# Patient Record
Sex: Male | Born: 1935 | Race: Black or African American | Hispanic: No | Marital: Married | Smoking: Never smoker
Health system: Southern US, Community
[De-identification: ages and names within clinical notes are randomized; demographics above are authoritative.]

## PROBLEM LIST (undated history)

## (undated) VITALS — BP 121/78 | HR 71 | Temp 98.5°F | Resp 18 | Wt 212.7 lb

## (undated) VITALS — HR 62 | Temp 98.5°F | Resp 18 | Wt 214.0 lb

## (undated) VITALS — BP 126/80 | HR 74 | Temp 97.7°F | Resp 18 | Wt 213.1 lb

## (undated) VITALS — BP 167/78 | HR 73 | Temp 97.5°F | Resp 18 | Wt 220.3 lb

## (undated) VITALS — BP 113/72 | HR 71 | Temp 98.1°F | Resp 18 | Wt 220.9 lb

## (undated) VITALS — BP 149/74 | HR 65 | Temp 97.6°F | Resp 18 | Wt 227.9 lb

## (undated) VITALS — BP 155/75 | HR 67 | Temp 98.5°F | Resp 18 | Wt 214.7 lb

## (undated) VITALS — BP 127/63 | HR 68 | Temp 98.1°F | Resp 18 | Wt 221.3 lb

## (undated) VITALS — BP 154/77 | HR 68 | Temp 98.6°F | Resp 18 | Wt 221.7 lb

## (undated) VITALS — BP 136/75 | HR 80 | Temp 98.0°F | Resp 18 | Wt 213.8 lb

## (undated) DIAGNOSIS — I10 Essential (primary) hypertension: Secondary | ICD-10-CM

## (undated) DIAGNOSIS — H409 Unspecified glaucoma: Secondary | ICD-10-CM

## (undated) DIAGNOSIS — E119 Type 2 diabetes mellitus without complications: Secondary | ICD-10-CM

## (undated) DIAGNOSIS — G609 Hereditary and idiopathic neuropathy, unspecified: Secondary | ICD-10-CM

## (undated) DIAGNOSIS — L738 Other specified follicular disorders: Secondary | ICD-10-CM

## (undated) DIAGNOSIS — N209 Urinary calculus, unspecified: Secondary | ICD-10-CM

## (undated) DIAGNOSIS — E785 Hyperlipidemia, unspecified: Secondary | ICD-10-CM

## (undated) DIAGNOSIS — E109 Type 1 diabetes mellitus without complications: Secondary | ICD-10-CM

## (undated) DIAGNOSIS — E291 Testicular hypofunction: Secondary | ICD-10-CM

## (undated) DIAGNOSIS — N400 Enlarged prostate without lower urinary tract symptoms: Secondary | ICD-10-CM

## (undated) DIAGNOSIS — C61 Malignant neoplasm of prostate: Secondary | ICD-10-CM

## (undated) DIAGNOSIS — IMO0002 Reserved for concepts with insufficient information to code with codable children: Secondary | ICD-10-CM

## (undated) DIAGNOSIS — H9190 Unspecified hearing loss, unspecified ear: Secondary | ICD-10-CM

## (undated) DIAGNOSIS — R809 Proteinuria, unspecified: Secondary | ICD-10-CM

## (undated) DIAGNOSIS — D72819 Decreased white blood cell count, unspecified: Secondary | ICD-10-CM

## (undated) DIAGNOSIS — F329 Major depressive disorder, single episode, unspecified: Secondary | ICD-10-CM

## (undated) DIAGNOSIS — M818 Other osteoporosis without current pathological fracture: Secondary | ICD-10-CM

## (undated) DIAGNOSIS — L409 Psoriasis, unspecified: Secondary | ICD-10-CM

## (undated) DIAGNOSIS — D126 Benign neoplasm of colon, unspecified: Secondary | ICD-10-CM

## (undated) HISTORY — DX: Psoriasis, unspecified: L40.9

## (undated) HISTORY — DX: Proteinuria, unspecified: R80.9

## (undated) HISTORY — DX: Benign prostatic hyperplasia without lower urinary tract symptoms: N40.0

## (undated) HISTORY — DX: Other specified follicular disorders: L73.8

## (undated) HISTORY — DX: Benign neoplasm of colon, unspecified: D12.6

## (undated) HISTORY — DX: Hereditary and idiopathic neuropathy, unspecified: G60.9

## (undated) HISTORY — DX: Other osteoporosis without current pathological fracture: M81.8

## (undated) HISTORY — DX: Testicular hypofunction: E29.1

## (undated) HISTORY — DX: Unspecified glaucoma: H40.9

## (undated) HISTORY — DX: Essential (primary) hypertension: I10

## (undated) HISTORY — DX: Hyperlipidemia, unspecified: E78.5

## (undated) HISTORY — DX: Unspecified hearing loss, unspecified ear: H91.90

## (undated) HISTORY — DX: Major depressive disorder, single episode, unspecified: F32.9

## (undated) HISTORY — DX: Urinary calculus, unspecified: N20.9

## (undated) HISTORY — DX: Type 1 diabetes mellitus without complications: E10.9

## (undated) HISTORY — DX: Decreased white blood cell count, unspecified: D72.819

## (undated) HISTORY — DX: Reserved for concepts with insufficient information to code with codable children: IMO0002

## (undated) HISTORY — DX: Type 2 diabetes mellitus without complications: E11.9

## (undated) HISTORY — PX: OTHER SURGICAL HISTORY: SHX169

---

## 1984-12-24 HISTORY — PX: APPENDECTOMY: SHX54

## 1995-12-25 HISTORY — PX: CATARACT EXTRACTION: SUR2

## 2004-04-17 ENCOUNTER — Encounter: Payer: Self-pay | Admitting: Gastroenterology

## 2004-09-21 ENCOUNTER — Encounter: Admission: RE | Admit: 2004-09-21 | Discharge: 2004-12-20 | Payer: Self-pay | Admitting: Endocrinology

## 2004-11-30 ENCOUNTER — Ambulatory Visit: Payer: Self-pay | Admitting: Endocrinology

## 2004-12-14 ENCOUNTER — Ambulatory Visit: Payer: Self-pay | Admitting: Endocrinology

## 2004-12-24 HISTORY — PX: CATARACT EXTRACTION: SUR2

## 2005-02-19 ENCOUNTER — Encounter: Admission: RE | Admit: 2005-02-19 | Discharge: 2005-05-20 | Payer: Self-pay | Admitting: Endocrinology

## 2005-02-22 ENCOUNTER — Ambulatory Visit: Payer: Self-pay | Admitting: Endocrinology

## 2005-03-14 ENCOUNTER — Ambulatory Visit: Payer: Self-pay | Admitting: Endocrinology

## 2005-03-16 ENCOUNTER — Encounter: Admission: RE | Admit: 2005-03-16 | Discharge: 2005-03-16 | Payer: Self-pay | Admitting: Endocrinology

## 2005-04-04 ENCOUNTER — Ambulatory Visit: Payer: Self-pay | Admitting: Endocrinology

## 2005-04-10 ENCOUNTER — Ambulatory Visit: Payer: Self-pay | Admitting: Endocrinology

## 2005-05-16 ENCOUNTER — Ambulatory Visit: Payer: Self-pay | Admitting: Endocrinology

## 2005-05-17 ENCOUNTER — Ambulatory Visit: Payer: Self-pay | Admitting: Endocrinology

## 2005-06-05 ENCOUNTER — Ambulatory Visit: Payer: Self-pay | Admitting: Endocrinology

## 2005-06-11 ENCOUNTER — Ambulatory Visit: Payer: Self-pay | Admitting: Endocrinology

## 2005-08-10 ENCOUNTER — Ambulatory Visit: Payer: Self-pay | Admitting: Endocrinology

## 2005-10-04 ENCOUNTER — Ambulatory Visit: Payer: Self-pay | Admitting: Endocrinology

## 2005-11-06 ENCOUNTER — Ambulatory Visit: Payer: Self-pay | Admitting: Endocrinology

## 2005-12-14 ENCOUNTER — Ambulatory Visit: Payer: Self-pay | Admitting: Internal Medicine

## 2005-12-19 ENCOUNTER — Ambulatory Visit: Payer: Self-pay | Admitting: Endocrinology

## 2006-04-01 ENCOUNTER — Ambulatory Visit: Payer: Self-pay | Admitting: Endocrinology

## 2006-04-03 ENCOUNTER — Ambulatory Visit: Payer: Self-pay | Admitting: Endocrinology

## 2006-09-27 ENCOUNTER — Ambulatory Visit: Payer: Self-pay | Admitting: Endocrinology

## 2006-10-02 ENCOUNTER — Ambulatory Visit: Payer: Self-pay | Admitting: Endocrinology

## 2006-12-18 ENCOUNTER — Ambulatory Visit: Payer: Self-pay | Admitting: Endocrinology

## 2007-02-17 ENCOUNTER — Ambulatory Visit: Payer: Self-pay | Admitting: Endocrinology

## 2007-02-17 LAB — CONVERTED CEMR LAB
AST: 13 units/L (ref 0–37)
Albumin: 3.5 g/dL (ref 3.5–5.2)
Alkaline Phosphatase: 101 units/L (ref 39–117)
BUN: 10 mg/dL (ref 6–23)
Bacteria, UA: NEGATIVE
Basophils Relative: 1.1 % — ABNORMAL HIGH (ref 0.0–1.0)
Bilirubin Urine: NEGATIVE
CO2: 30 meq/L (ref 19–32)
Chloride: 101 meq/L (ref 96–112)
Creatinine, Ser: 1.1 mg/dL (ref 0.4–1.5)
Crystals: NEGATIVE
Eosinophils Relative: 4.2 % (ref 0.0–5.0)
HCT: 40.5 % (ref 39.0–52.0)
HDL: 47.7 mg/dL (ref >39.0)
Hemoglobin: 14.1 g/dL (ref 13.0–17.0)
Lymphocytes Relative: 37.9 % (ref 12.0–46.0)
Microalb, Ur: 6 mg/dL — ABNORMAL HIGH (ref 0.0–1.9)
Monocytes Absolute: 0.2 10*3/uL (ref 0.2–0.7)
Monocytes Relative: 7.8 % (ref 3.0–11.0)
Mucus, UA: NEGATIVE
Neutro Abs: 1.7 10*3/uL (ref 1.4–7.7)
Neutrophils Relative %: 49 % (ref 43.0–77.0)
Nitrite: NEGATIVE
Potassium: 4.8 meq/L (ref 3.5–5.1)
Sodium: 139 meq/L (ref 135–145)
TSH: 0.83 microintl units/mL (ref 0.35–5.50)
Total Bilirubin: 1.1 mg/dL (ref 0.3–1.2)
Total Protein: 6.5 g/dL (ref 6.0–8.3)
Urine Glucose: 500 mg/dL — AB
Urobilinogen, UA: 0.2 (ref 0.0–1.0)
VLDL: 22 mg/dL (ref 0–40)
WBC: 3.2 10*3/uL — ABNORMAL LOW (ref 4.5–10.5)

## 2007-02-20 ENCOUNTER — Ambulatory Visit: Payer: Self-pay | Admitting: Endocrinology

## 2007-03-01 ENCOUNTER — Encounter: Admission: RE | Admit: 2007-03-01 | Discharge: 2007-03-01 | Payer: Self-pay | Admitting: Endocrinology

## 2007-03-03 ENCOUNTER — Ambulatory Visit: Payer: Self-pay | Admitting: Endocrinology

## 2007-03-04 ENCOUNTER — Ambulatory Visit: Payer: Self-pay | Admitting: Internal Medicine

## 2007-06-30 ENCOUNTER — Encounter: Payer: Self-pay | Admitting: Endocrinology

## 2007-06-30 DIAGNOSIS — E1049 Type 1 diabetes mellitus with other diabetic neurological complication: Secondary | ICD-10-CM | POA: Insufficient documentation

## 2007-06-30 DIAGNOSIS — G609 Hereditary and idiopathic neuropathy, unspecified: Secondary | ICD-10-CM

## 2007-06-30 DIAGNOSIS — E109 Type 1 diabetes mellitus without complications: Secondary | ICD-10-CM

## 2007-06-30 HISTORY — DX: Type 1 diabetes mellitus without complications: E10.9

## 2007-06-30 HISTORY — DX: Hereditary and idiopathic neuropathy, unspecified: G60.9

## 2007-07-04 ENCOUNTER — Ambulatory Visit: Payer: Self-pay | Admitting: Endocrinology

## 2007-09-03 ENCOUNTER — Ambulatory Visit: Payer: Self-pay | Admitting: Endocrinology

## 2007-09-03 LAB — CONVERTED CEMR LAB
ALT: 14 units/L (ref 0–53)
Albumin: 3.6 g/dL (ref 3.5–5.2)
Basophils Absolute: 0 10*3/uL (ref 0.0–0.1)
Bilirubin, Direct: 0.1 mg/dL (ref 0.0–0.3)
Calcium: 9.6 mg/dL (ref 8.4–10.5)
Cholesterol: 156 mg/dL (ref 0–200)
Creatinine,U: 109.4 mg/dL
Eosinophils Absolute: 0.1 10*3/uL (ref 0.0–0.6)
GFR calc Af Amer: 95 mL/min
GFR calc non Af Amer: 79 mL/min
Glucose, Bld: 199 mg/dL — ABNORMAL HIGH (ref 70–99)
HCT: 39.5 % (ref 39.0–52.0)
Hgb A1c MFr Bld: 11.7 % — ABNORMAL HIGH (ref 4.6–6.0)
Ketones, ur: NEGATIVE mg/dL
LDL Cholesterol: 83 mg/dL (ref 0–99)
Leukocytes, UA: NEGATIVE
MCHC: 34.6 g/dL (ref 30.0–36.0)
MCV: 90.4 fL (ref 78.0–100.0)
Microalb Creat Ratio: 26.5 mg/g (ref 0.0–30.0)
Monocytes Relative: 8.7 % (ref 3.0–11.0)
Platelets: 229 10*3/uL (ref 150–400)
RBC: 4.37 M/uL (ref 4.22–5.81)
RDW: 13.5 % (ref 11.5–14.6)
Sodium: 141 meq/L (ref 135–145)
Specific Gravity, Urine: 1.02 (ref 1.000–1.03)
Total CHOL/HDL Ratio: 2.7
Total Protein, Urine: NEGATIVE mg/dL
Triglycerides: 71 mg/dL (ref 0–149)
Vitamin B-12: 401 pg/mL (ref 211–911)
pH: 5.5 (ref 5.0–8.0)

## 2007-12-16 ENCOUNTER — Ambulatory Visit: Payer: Self-pay | Admitting: Endocrinology

## 2007-12-16 DIAGNOSIS — E291 Testicular hypofunction: Secondary | ICD-10-CM | POA: Insufficient documentation

## 2007-12-16 DIAGNOSIS — M818 Other osteoporosis without current pathological fracture: Secondary | ICD-10-CM

## 2007-12-16 HISTORY — DX: Other osteoporosis without current pathological fracture: M81.8

## 2007-12-16 HISTORY — DX: Testicular hypofunction: E29.1

## 2007-12-17 ENCOUNTER — Encounter: Payer: Self-pay | Admitting: Endocrinology

## 2007-12-19 ENCOUNTER — Telehealth (INDEPENDENT_AMBULATORY_CARE_PROVIDER_SITE_OTHER): Payer: Self-pay | Admitting: *Deleted

## 2007-12-22 LAB — CONVERTED CEMR LAB
Alpha-1-Globulin: 4.3 % (ref 2.9–4.9)
Alpha-2-Globulin: 11.8 % (ref 7.1–11.8)
Hemoglobin: 13.4 g/dL (ref 13.0–17.0)
Total Protein, Serum Electrophoresis: 7.2 g/dL (ref 6.0–8.3)

## 2007-12-23 ENCOUNTER — Ambulatory Visit: Payer: Self-pay | Admitting: Endocrinology

## 2008-01-06 ENCOUNTER — Ambulatory Visit: Payer: Self-pay | Admitting: Endocrinology

## 2008-02-12 ENCOUNTER — Encounter: Payer: Self-pay | Admitting: Endocrinology

## 2008-03-16 ENCOUNTER — Ambulatory Visit: Payer: Self-pay | Admitting: Endocrinology

## 2008-03-16 DIAGNOSIS — F3289 Other specified depressive episodes: Secondary | ICD-10-CM | POA: Insufficient documentation

## 2008-03-16 DIAGNOSIS — F329 Major depressive disorder, single episode, unspecified: Secondary | ICD-10-CM

## 2008-03-16 HISTORY — DX: Major depressive disorder, single episode, unspecified: F32.9

## 2008-03-16 HISTORY — DX: Other specified depressive episodes: F32.89

## 2008-03-16 LAB — CONVERTED CEMR LAB: Hgb A1c MFr Bld: 11.3 % — ABNORMAL HIGH (ref 4.6–6.0)

## 2008-04-07 ENCOUNTER — Telehealth: Payer: Self-pay | Admitting: Endocrinology

## 2008-05-03 ENCOUNTER — Encounter: Payer: Self-pay | Admitting: Endocrinology

## 2008-05-14 ENCOUNTER — Ambulatory Visit: Payer: Self-pay | Admitting: Endocrinology

## 2008-05-14 DIAGNOSIS — I10 Essential (primary) hypertension: Secondary | ICD-10-CM

## 2008-05-14 DIAGNOSIS — E1159 Type 2 diabetes mellitus with other circulatory complications: Secondary | ICD-10-CM | POA: Insufficient documentation

## 2008-05-14 HISTORY — DX: Essential (primary) hypertension: I10

## 2008-05-18 ENCOUNTER — Telehealth: Payer: Self-pay | Admitting: Endocrinology

## 2008-05-20 ENCOUNTER — Telehealth: Payer: Self-pay | Admitting: Endocrinology

## 2008-05-21 ENCOUNTER — Telehealth: Payer: Self-pay | Admitting: Endocrinology

## 2008-05-24 ENCOUNTER — Encounter: Payer: Self-pay | Admitting: Endocrinology

## 2008-07-28 ENCOUNTER — Ambulatory Visit: Payer: Self-pay | Admitting: Endocrinology

## 2008-07-28 ENCOUNTER — Telehealth: Payer: Self-pay | Admitting: Endocrinology

## 2008-07-28 DIAGNOSIS — L738 Other specified follicular disorders: Secondary | ICD-10-CM

## 2008-07-28 DIAGNOSIS — L678 Other hair color and hair shaft abnormalities: Secondary | ICD-10-CM

## 2008-07-28 HISTORY — DX: Other specified follicular disorders: L73.8

## 2008-07-28 HISTORY — DX: Other hair color and hair shaft abnormalities: L67.8

## 2009-03-01 ENCOUNTER — Encounter: Payer: Self-pay | Admitting: Endocrinology

## 2009-03-03 ENCOUNTER — Ambulatory Visit: Payer: Self-pay | Admitting: Endocrinology

## 2009-03-04 ENCOUNTER — Encounter: Payer: Self-pay | Admitting: Endocrinology

## 2009-04-11 ENCOUNTER — Encounter: Payer: Self-pay | Admitting: Endocrinology

## 2009-04-11 ENCOUNTER — Telehealth: Payer: Self-pay | Admitting: Endocrinology

## 2009-04-12 ENCOUNTER — Telehealth: Payer: Self-pay | Admitting: Endocrinology

## 2009-07-12 ENCOUNTER — Telehealth: Payer: Self-pay | Admitting: Internal Medicine

## 2009-07-19 ENCOUNTER — Ambulatory Visit: Payer: Self-pay | Admitting: Internal Medicine

## 2009-07-19 DIAGNOSIS — H409 Unspecified glaucoma: Secondary | ICD-10-CM

## 2009-07-19 DIAGNOSIS — N400 Enlarged prostate without lower urinary tract symptoms: Secondary | ICD-10-CM

## 2009-07-19 DIAGNOSIS — C61 Malignant neoplasm of prostate: Secondary | ICD-10-CM | POA: Insufficient documentation

## 2009-07-19 HISTORY — DX: Unspecified glaucoma: H40.9

## 2009-07-19 HISTORY — DX: Benign prostatic hyperplasia without lower urinary tract symptoms: N40.0

## 2009-07-19 LAB — CONVERTED CEMR LAB
CO2: 30 meq/L (ref 19–32)
Chloride: 105 meq/L (ref 96–112)
Cholesterol: 160 mg/dL (ref 0–200)
HDL: 60.7 mg/dL (ref >39.00)
LDL Cholesterol: 85 mg/dL (ref 0–99)
Potassium: 4.6 meq/L (ref 3.5–5.1)
Sodium: 140 meq/L (ref 135–145)
Triglycerides: 73 mg/dL (ref 0.0–149.0)
VLDL: 14.6 mg/dL (ref 0.0–40.0)

## 2009-07-20 ENCOUNTER — Encounter: Payer: Self-pay | Admitting: Endocrinology

## 2009-07-20 ENCOUNTER — Ambulatory Visit: Payer: Self-pay | Admitting: Internal Medicine

## 2009-09-12 ENCOUNTER — Telehealth (INDEPENDENT_AMBULATORY_CARE_PROVIDER_SITE_OTHER): Payer: Self-pay | Admitting: *Deleted

## 2009-10-21 ENCOUNTER — Ambulatory Visit: Payer: Self-pay | Admitting: Endocrinology

## 2009-10-21 DIAGNOSIS — R209 Unspecified disturbances of skin sensation: Secondary | ICD-10-CM | POA: Insufficient documentation

## 2010-02-14 ENCOUNTER — Ambulatory Visit: Payer: Self-pay | Admitting: Endocrinology

## 2010-02-14 ENCOUNTER — Telehealth: Payer: Self-pay | Admitting: Endocrinology

## 2010-02-14 DIAGNOSIS — R809 Proteinuria, unspecified: Secondary | ICD-10-CM | POA: Insufficient documentation

## 2010-02-14 DIAGNOSIS — D126 Benign neoplasm of colon, unspecified: Secondary | ICD-10-CM | POA: Insufficient documentation

## 2010-02-14 DIAGNOSIS — H9190 Unspecified hearing loss, unspecified ear: Secondary | ICD-10-CM | POA: Insufficient documentation

## 2010-02-14 HISTORY — DX: Unspecified hearing loss, unspecified ear: H91.90

## 2010-02-14 HISTORY — DX: Benign neoplasm of colon, unspecified: D12.6

## 2010-02-14 HISTORY — DX: Proteinuria, unspecified: R80.9

## 2010-02-15 ENCOUNTER — Encounter (INDEPENDENT_AMBULATORY_CARE_PROVIDER_SITE_OTHER): Payer: Self-pay | Admitting: *Deleted

## 2010-02-17 ENCOUNTER — Emergency Department: Payer: Self-pay | Admitting: Emergency Medicine

## 2010-02-17 ENCOUNTER — Encounter: Payer: Self-pay | Admitting: Endocrinology

## 2010-02-23 ENCOUNTER — Ambulatory Visit: Payer: Self-pay | Admitting: Endocrinology

## 2010-02-23 DIAGNOSIS — IMO0002 Reserved for concepts with insufficient information to code with codable children: Secondary | ICD-10-CM

## 2010-02-23 HISTORY — DX: Reserved for concepts with insufficient information to code with codable children: IMO0002

## 2010-02-23 LAB — CONVERTED CEMR LAB: Hgb A1c MFr Bld: 10.6 % — ABNORMAL HIGH (ref 4.6–6.5)

## 2010-02-25 ENCOUNTER — Ambulatory Visit (HOSPITAL_COMMUNITY): Admission: RE | Admit: 2010-02-25 | Discharge: 2010-02-25 | Payer: Self-pay | Admitting: Endocrinology

## 2010-02-27 ENCOUNTER — Encounter: Payer: Self-pay | Admitting: Endocrinology

## 2010-03-10 ENCOUNTER — Ambulatory Visit: Payer: Self-pay | Admitting: Endocrinology

## 2010-03-10 ENCOUNTER — Encounter: Payer: Self-pay | Admitting: Endocrinology

## 2010-03-10 DIAGNOSIS — R059 Cough, unspecified: Secondary | ICD-10-CM | POA: Insufficient documentation

## 2010-03-10 DIAGNOSIS — R05 Cough: Secondary | ICD-10-CM

## 2010-04-10 ENCOUNTER — Telehealth: Payer: Self-pay | Admitting: Endocrinology

## 2010-04-29 ENCOUNTER — Encounter: Payer: Self-pay | Admitting: Endocrinology

## 2010-05-01 ENCOUNTER — Telehealth (INDEPENDENT_AMBULATORY_CARE_PROVIDER_SITE_OTHER): Payer: Self-pay | Admitting: *Deleted

## 2010-07-25 ENCOUNTER — Encounter: Payer: Self-pay | Admitting: Endocrinology

## 2010-07-25 ENCOUNTER — Telehealth: Payer: Self-pay | Admitting: Endocrinology

## 2010-07-25 ENCOUNTER — Inpatient Hospital Stay
Admission: EM | Admit: 2010-07-25 | Disposition: A | Discharge status: Home / Self Care | Payer: Self-pay | Source: Emergency Department | Admitting: Critical Care Medicine

## 2010-07-25 LAB — COMPREHENSIVE METABOLIC PANEL
ALT: 30 U/L (ref 3–36)
AST (SGOT): 15 U/L (ref 10–41)
Albumin/Globulin Ratio: 1.5 (ref 1.1–1.8)
Albumin: 3.5 g/dL (ref 3.4–4.9)
Alkaline Phosphatase: 174 U/L — ABNORMAL HIGH (ref 43–112)
BUN: 28 mg/dL — ABNORMAL HIGH (ref 8–20)
BUN: 28 mg/dL — ABNORMAL HIGH (ref 8–20)
Bilirubin, Total: 0.5 mg/dL (ref 0.1–1.0)
CO2: 8 mEq/L — CR (ref 21–30)
CO2: 9 mEq/L — CR (ref 21–30)
Calcium: 8.4 mg/dL — ABNORMAL LOW (ref 8.6–10.2)
Calcium: 8.6 mg/dL (ref 8.6–10.2)
Chloride: 105 mEq/L (ref 98–107)
Chloride: 106 mEq/L (ref 98–107)
Creatinine: 1.4 mg/dL (ref 0.6–1.5)
Creatinine: 1.5 mg/dL (ref 0.6–1.5)
Globulin: 2.4 g/dL (ref 2.0–3.7)
Glucose: 697 mg/dL — CR (ref 70–100)
Glucose: 618 mg/dL — CR (ref 70–100)
Potassium: 5.6 mEq/L — ABNORMAL HIGH (ref 3.6–5.0)
Potassium: 5.2 mEq/L — ABNORMAL HIGH (ref 3.6–5.0)
Protein, Total: 5.9 g/dL — ABNORMAL LOW (ref 6.0–8.0)
Sodium: 139 mEq/L (ref 136–146)
Sodium: 142 mEq/L (ref 136–146)

## 2010-07-25 LAB — URINALYSIS, REFLEX TO MICROSCOPIC EXAM IF INDICATED
Bilirubin, UA: NEGATIVE
Blood, UA: NEGATIVE
Glucose, UA: >1000 — AB
Ketones UA: >80
Leukocyte Esterase, UA: NEGATIVE
Nitrite, UA: NEGATIVE
Specific Gravity UA POCT: 1.017 (ref 1.001–1.035)
Urine pH: 5.5 (ref 5.0–8.0)
Urobilinogen, UA: NORMAL mg/dL

## 2010-07-25 LAB — BASIC METABOLIC PANEL
BUN: 23 mg/dL — ABNORMAL HIGH (ref 8–20)
BUN: 18 mg/dL (ref 8–20)
CO2: 21 mEq/L (ref 21–30)
CO2: 19 mEq/L — ABNORMAL LOW (ref 21–30)
Calcium: 8.3 mg/dL — ABNORMAL LOW (ref 8.6–10.2)
Calcium: 6.7 mg/dL — ABNORMAL LOW (ref 8.6–10.2)
Chloride: 114 mEq/L — ABNORMAL HIGH (ref 98–107)
Chloride: 119 mEq/L — ABNORMAL HIGH (ref 98–107)
Creatinine: 1.2 mg/dL (ref 0.6–1.5)
Creatinine: 0.9 mg/dL (ref 0.6–1.5)
Glucose: 184 mg/dL — ABNORMAL HIGH (ref 70–100)
Glucose: 140 mg/dL — ABNORMAL HIGH (ref 70–100)
Potassium: 4 mEq/L (ref 3.6–5.0)
Potassium: 3.3 mEq/L — ABNORMAL LOW (ref 3.6–5.0)
Sodium: 146 mEq/L (ref 136–146)
Sodium: 153 mEq/L — ABNORMAL HIGH (ref 136–146)

## 2010-07-25 LAB — BLOOD GAS, ARTERIAL
Arterial Total CO2: 18.7 mEq/L — ABNORMAL LOW (ref 24.0–30.0)
Base Excess, Arterial: -5.5 mEq/L — ABNORMAL LOW (ref <=2.0)
HCO3, Arterial: 17.8 mEq/L — ABNORMAL LOW (ref 23.0–29.0)
O2 Sat, Arterial: 99.5 % (ref 95.0–100.0)
Temperature: 35.3
pCO2, Arterial: 26.6 mmHg — ABNORMAL LOW (ref 35.0–45.0)
pH, Arterial: 7.43 (ref 7.350–7.450)
pO2, Arterial: 122 mmHg — ABNORMAL HIGH (ref 80.0–90.0)

## 2010-07-25 LAB — CBC AND DIFFERENTIAL
Baso(Absolute): 0.03 10*3/uL (ref 0.00–0.20)
Basophils: 0 % (ref 0–2)
Eosinophils Absolute: 0.04 10*3/uL (ref 0.00–0.70)
Eosinophils: 0 % (ref 0–5)
Hematocrit: 38.4 % — ABNORMAL LOW (ref 42.0–52.0)
Hgb: 12.2 g/dL — ABNORMAL LOW (ref 13.0–17.0)
Immature Granulocytes Absolute: 0.16 10*3/uL — ABNORMAL HIGH
Immature Granulocytes: 1 % (ref 0–1)
Lymphocytes Absolute: 0.78 10*3/uL (ref 0.50–4.40)
Lymphocytes: 5 % — ABNORMAL LOW (ref 15–41)
MCH: 29.8 pg (ref 28.0–32.0)
MCHC: 31.8 g/dL — ABNORMAL LOW (ref 32.0–36.0)
MCV: 93.9 fL (ref 80.0–100.0)
MPV: 13.2 fL — ABNORMAL HIGH (ref 9.4–12.3)
Monocytes Absolute: 0.97 10*3/uL (ref 0.00–1.20)
Monocytes: 6 % (ref 0–11)
Neutrophils Absolute: 15.14 10*3/uL
Neutrophils: 88 % — ABNORMAL HIGH (ref 52–75)
Platelets: 160 10*3/uL (ref 140–400)
RBC: 4.09 10*6/uL — ABNORMAL LOW (ref 4.70–6.00)
RDW: 14 % (ref 12–15)
WBC: 17.12 10*3/uL — ABNORMAL HIGH (ref 3.50–10.80)

## 2010-07-25 LAB — I-STAT CG4 ARTERIAL CARTRIDGE
Lactic Acid I-Stat: 1.9 mEq/L (ref 0.5–2.2)
i-STAT Base Excess Arterial: -21 mEq/L — ABNORMAL LOW (ref <=2.0)
i-STAT FIO2: 28
i-STAT HCO3 Bicarbonate Arterial: 6.6 mEq/L — ABNORMAL LOW (ref 23.0–29.0)
i-STAT Liters Per Minute: 2
i-STAT O2 Saturation Arterial: 96 % (ref 95.0–100.0)
i-STAT Patient Temperature: 98.2
i-STAT Total CO2 Arterial: 7 mEq/L — ABNORMAL LOW (ref 24.0–30.0)
i-STAT pCO2 Arterial: 19.2 mmHg — CR (ref 35.0–45.0)
i-STAT pH Arterial: 7.14 — CR (ref 7.350–7.450)
i-STAT pO2 Arterial: 105 mmHg — ABNORMAL HIGH (ref 80.0–90.0)

## 2010-07-25 LAB — HEPATIC FUNCTION PANEL
ALT: 27 U/L (ref 3–36)
AST (SGOT): 15 U/L (ref 10–41)
Albumin/Globulin Ratio: 1.4 (ref 1.1–1.8)
Albumin: 3.4 g/dL (ref 3.4–4.9)
Alkaline Phosphatase: 162 U/L — ABNORMAL HIGH (ref 43–112)
Bilirubin Direct: 0.2 mg/dL (ref 0.0–0.3)
Bilirubin Indirect: 0.4 mg/dL (ref 0.1–0.9)
Bilirubin, Total: 0.6 mg/dL (ref 0.1–1.0)
Globulin: 2.4 g/dL (ref 2.0–3.7)
Protein, Total: 5.8 g/dL — ABNORMAL LOW (ref 6.0–8.0)

## 2010-07-25 LAB — GFR
EGFR: >60
EGFR: 55.6
EGFR: >60
EGFR: >60

## 2010-07-25 LAB — I-STAT TROPONIN: i-STAT Troponin: 0 ng/mL (ref 0.00–0.09)

## 2010-07-25 LAB — CALCIUM, IONIZED: Calcium, Ionized: 2.19 mEq/L — ABNORMAL LOW (ref 2.30–2.58)

## 2010-07-25 LAB — TROPONIN I: Troponin I: 0.04 ng/mL (ref 0.00–0.09)

## 2010-07-25 LAB — ACETONE

## 2010-07-25 LAB — PHOSPHORUS: Phosphorus: 2.3 mg/dL — ABNORMAL LOW (ref 2.5–4.5)

## 2010-07-25 LAB — CK: Creatine Kinase (CK): 82 U/L (ref 20–297)

## 2010-07-25 LAB — LIPASE: Lipase: 114 U/L (ref 32–219)

## 2010-07-25 LAB — HEMOGLOBIN A1C: Hemoglobin A1C: 12.5 % — ABNORMAL HIGH (ref 0.0–6.0)

## 2010-07-25 LAB — MAGNESIUM: Magnesium: 2 mg/dL (ref 1.6–2.3)

## 2010-07-25 LAB — AMYLASE: Amylase: 10 U/L (ref 0–90)

## 2010-07-26 LAB — PHOSPHORUS
Phosphorus: 2.3 mg/dL — ABNORMAL LOW (ref 2.5–4.5)
Phosphorus: 2 mg/dL — ABNORMAL LOW (ref 2.5–4.5)

## 2010-07-26 LAB — CBC
Hematocrit: 35.1 % — ABNORMAL LOW (ref 42.0–52.0)
Hgb: 11.4 g/dL — ABNORMAL LOW (ref 13.0–17.0)
MCH: 29.2 pg (ref 28.0–32.0)
MCHC: 32.5 g/dL (ref 32.0–36.0)
MCV: 89.8 fL (ref 80.0–100.0)
MPV: 11.9 fL (ref 9.4–12.3)
Platelets: 223 10*3/uL (ref 140–400)
RBC: 3.91 10*6/uL — ABNORMAL LOW (ref 4.70–6.00)
RDW: 14 % (ref 12–15)
WBC: 13.37 10*3/uL — ABNORMAL HIGH (ref 3.50–10.80)

## 2010-07-26 LAB — BLOOD GAS, ARTERIAL
Arterial Total CO2: 22.8 mEq/L — ABNORMAL LOW (ref 24.0–30.0)
Base Excess, Arterial: -1.5 mEq/L (ref <=2.0)
FIO2: 21 %
HCO3, Arterial: 21.7 mEq/L — ABNORMAL LOW (ref 23.0–29.0)
O2 Sat, Arterial: 98.8 % (ref 95.0–100.0)
Temperature: 35.8
pCO2, Arterial: 31.7 mmHg — ABNORMAL LOW (ref 35.0–45.0)
pH, Arterial: 7.444 (ref 7.350–7.450)
pO2, Arterial: 94.7 mmHg — ABNORMAL HIGH (ref 80.0–90.0)

## 2010-07-26 LAB — TROPONIN I: Troponin I: 0.08 ng/mL (ref 0.00–0.09)

## 2010-07-26 LAB — BASIC METABOLIC PANEL
BUN: 17 mg/dL (ref 8–20)
BUN: 12 mg/dL (ref 8–20)
CO2: 24 mEq/L (ref 21–30)
CO2: 23 mEq/L (ref 21–30)
Calcium: 8.2 mg/dL — ABNORMAL LOW (ref 8.6–10.2)
Calcium: 8.2 mg/dL — ABNORMAL LOW (ref 8.6–10.2)
Chloride: 118 mEq/L — ABNORMAL HIGH (ref 98–107)
Chloride: 115 mEq/L — ABNORMAL HIGH (ref 98–107)
Creatinine: 1 mg/dL (ref 0.6–1.5)
Creatinine: 0.9 mg/dL (ref 0.6–1.5)
Glucose: 160 mg/dL — ABNORMAL HIGH (ref 70–100)
Glucose: 261 mg/dL — ABNORMAL HIGH (ref 70–100)
Potassium: 3.6 mEq/L (ref 3.6–5.0)
Potassium: 4.1 mEq/L (ref 3.6–5.0)
Sodium: 146 mEq/L (ref 136–146)
Sodium: 141 mEq/L (ref 136–146)

## 2010-07-26 LAB — MAGNESIUM
Magnesium: 2.2 mg/dL (ref 1.6–2.3)
Magnesium: 1.8 mg/dL (ref 1.6–2.3)

## 2010-07-26 LAB — CK: Creatine Kinase (CK): 89 U/L (ref 20–297)

## 2010-07-26 LAB — GFR
EGFR: >60
EGFR: >60

## 2010-07-26 LAB — CALCIUM, IONIZED: Calcium, Ionized: 2.49 mEq/L (ref 2.30–2.58)

## 2010-07-27 LAB — BASIC METABOLIC PANEL
BUN: 8 mg/dL (ref 8–20)
CO2: 26 mEq/L (ref 21–30)
Calcium: 8.1 mg/dL — ABNORMAL LOW (ref 8.6–10.2)
Chloride: 108 mEq/L — ABNORMAL HIGH (ref 98–107)
Creatinine: 0.8 mg/dL (ref 0.6–1.5)
Glucose: 177 mg/dL — ABNORMAL HIGH (ref 70–100)
Potassium: 3.8 mEq/L (ref 3.6–5.0)
Sodium: 138 mEq/L (ref 136–146)

## 2010-07-27 LAB — VANCOMYCIN, TROUGH
Vancomycin Time of Last Dose: 1000
Vancomycin Trough: 8 ug/mL (ref 5–10)

## 2010-07-27 LAB — CBC
Hematocrit: 32.9 % — ABNORMAL LOW (ref 42.0–52.0)
Hgb: 10.8 g/dL — ABNORMAL LOW (ref 13.0–17.0)
MCH: 29.4 pg (ref 28.0–32.0)
MCHC: 32.8 g/dL (ref 32.0–36.0)
MCV: 89.6 fL (ref 80.0–100.0)
MPV: 11.5 fL (ref 9.4–12.3)
Platelets: 191 10*3/uL (ref 140–400)
RBC: 3.67 10*6/uL — ABNORMAL LOW (ref 4.70–6.00)
RDW: 14 % (ref 12–15)
WBC: 4.45 10*3/uL (ref 3.50–10.80)

## 2010-07-27 LAB — GFR: EGFR: >60

## 2010-07-27 LAB — MAGNESIUM: Magnesium: 1.6 mg/dL (ref 1.6–2.3)

## 2010-07-28 ENCOUNTER — Encounter: Payer: Self-pay | Admitting: Endocrinology

## 2010-07-28 LAB — GFR: EGFR: >60

## 2010-07-28 LAB — CBC
Hematocrit: 35.2 % — ABNORMAL LOW (ref 42.0–52.0)
Hgb: 11.6 g/dL — ABNORMAL LOW (ref 13.0–17.0)
MCH: 29.1 pg (ref 28.0–32.0)
MCHC: 33 g/dL (ref 32.0–36.0)
MCV: 88.2 fL (ref 80.0–100.0)
MPV: 12 fL (ref 9.4–12.3)
Platelets: 184 10*3/uL (ref 140–400)
RBC: 3.99 10*6/uL — ABNORMAL LOW (ref 4.70–6.00)
RDW: 14 % (ref 12–15)
WBC: 3.16 10*3/uL — ABNORMAL LOW (ref 3.50–10.80)

## 2010-07-28 LAB — BASIC METABOLIC PANEL
BUN: 7 mg/dL — ABNORMAL LOW (ref 8–20)
CO2: 26 mEq/L (ref 21–30)
Calcium: 8.2 mg/dL — ABNORMAL LOW (ref 8.6–10.2)
Chloride: 105 mEq/L (ref 98–107)
Creatinine: 0.8 mg/dL (ref 0.6–1.5)
Glucose: 217 mg/dL — ABNORMAL HIGH (ref 70–100)
Potassium: 3.9 mEq/L (ref 3.6–5.0)
Sodium: 136 mEq/L (ref 136–146)

## 2010-07-28 LAB — MAGNESIUM: Magnesium: 1.5 mg/dL — ABNORMAL LOW (ref 1.6–2.3)

## 2010-07-31 ENCOUNTER — Ambulatory Visit
Admit: 2010-07-31 | Disposition: A | Discharge status: Home / Self Care | Payer: Self-pay | Source: Ambulatory Visit | Admitting: Adult Health

## 2010-08-17 ENCOUNTER — Encounter: Payer: Self-pay | Admitting: Endocrinology

## 2010-10-05 ENCOUNTER — Ambulatory Visit: Payer: Self-pay | Admitting: Endocrinology

## 2010-10-09 ENCOUNTER — Telehealth: Payer: Self-pay | Admitting: Endocrinology

## 2010-10-26 ENCOUNTER — Telehealth (INDEPENDENT_AMBULATORY_CARE_PROVIDER_SITE_OTHER): Payer: Self-pay | Admitting: *Deleted

## 2010-11-24 ENCOUNTER — Ambulatory Visit: Payer: Self-pay | Admitting: Endocrinology

## 2010-11-24 ENCOUNTER — Telehealth: Payer: Self-pay | Admitting: Endocrinology

## 2010-11-24 LAB — CONVERTED CEMR LAB
LH: 1.81 milliintl units/mL — ABNORMAL LOW (ref 3.10–34.60)
Microalb Creat Ratio: 12.6 mg/g (ref 0.0–30.0)
Testosterone: 300.16 ng/dL — ABNORMAL LOW (ref 350.00–890.00)
Vitamin B-12: 332 pg/mL (ref 211–911)

## 2010-11-27 ENCOUNTER — Telehealth: Payer: Self-pay | Admitting: Endocrinology

## 2010-11-28 ENCOUNTER — Encounter (INDEPENDENT_AMBULATORY_CARE_PROVIDER_SITE_OTHER): Payer: Self-pay | Admitting: *Deleted

## 2010-11-29 ENCOUNTER — Telehealth: Payer: Self-pay | Admitting: Endocrinology

## 2010-12-21 ENCOUNTER — Encounter (INDEPENDENT_AMBULATORY_CARE_PROVIDER_SITE_OTHER): Payer: Self-pay | Admitting: *Deleted

## 2010-12-26 ENCOUNTER — Ambulatory Visit
Admission: RE | Admit: 2010-12-26 | Discharge: 2010-12-26 | Payer: Self-pay | Source: Home / Self Care | Attending: Gastroenterology | Admitting: Gastroenterology

## 2010-12-27 ENCOUNTER — Ambulatory Visit
Admission: RE | Admit: 2010-12-27 | Discharge: 2010-12-27 | Payer: Self-pay | Source: Home / Self Care | Attending: Endocrinology | Admitting: Endocrinology

## 2010-12-27 ENCOUNTER — Other Ambulatory Visit: Payer: Self-pay | Admitting: Endocrinology

## 2010-12-27 DIAGNOSIS — N209 Urinary calculus, unspecified: Secondary | ICD-10-CM

## 2010-12-27 HISTORY — DX: Urinary calculus, unspecified: N20.9

## 2010-12-27 LAB — BASIC METABOLIC PANEL
BUN: 19 mg/dL (ref 6–23)
CO2: 26 mEq/L (ref 19–32)
Calcium: 9.3 mg/dL (ref 8.4–10.5)
Chloride: 106 mEq/L (ref 96–112)
Creatinine, Ser: 0.8 mg/dL (ref 0.4–1.5)
GFR: 125.54 mL/min (ref >60.00)
Glucose, Bld: 227 mg/dL — ABNORMAL HIGH (ref 70–99)
Potassium: 4.4 mEq/L (ref 3.5–5.1)
Sodium: 139 mEq/L (ref 135–145)

## 2010-12-27 LAB — URINALYSIS, ROUTINE W REFLEX MICROSCOPIC
Bilirubin Urine: NEGATIVE
Ketones, ur: NEGATIVE
Leukocytes, UA: NEGATIVE
Nitrite: NEGATIVE
Specific Gravity, Urine: 1.025 (ref 1.000–1.030)
Total Protein, Urine: 30
Urine Glucose: >=1000
Urobilinogen, UA: 0.2 (ref 0.0–1.0)
pH: 6 (ref 5.0–8.0)

## 2010-12-27 LAB — CBC WITH DIFFERENTIAL/PLATELET
Basophils Absolute: 0 10*3/uL (ref 0.0–0.1)
Basophils Relative: 0.7 % (ref 0.0–3.0)
Eosinophils Absolute: 0.2 10*3/uL (ref 0.0–0.7)
Eosinophils Relative: 4.6 % (ref 0.0–5.0)
HCT: 39.6 % (ref 39.0–52.0)
Hemoglobin: 13.5 g/dL (ref 13.0–17.0)
Lymphocytes Relative: 29.7 % (ref 12.0–46.0)
Lymphs Abs: 1.2 10*3/uL (ref 0.7–4.0)
MCHC: 34 g/dL (ref 30.0–36.0)
MCV: 90.8 fl (ref 78.0–100.0)
Monocytes Absolute: 0.4 10*3/uL (ref 0.1–1.0)
Monocytes Relative: 10.1 % (ref 3.0–12.0)
Neutro Abs: 2.3 10*3/uL (ref 1.4–7.7)
Neutrophils Relative %: 54.9 % (ref 43.0–77.0)
Platelets: 199 10*3/uL (ref 150.0–400.0)
RBC: 4.36 Mil/uL (ref 4.22–5.81)
RDW: 14.3 % (ref 11.5–14.6)
WBC: 4.2 10*3/uL — ABNORMAL LOW (ref 4.5–10.5)

## 2010-12-27 LAB — HEPATIC FUNCTION PANEL
ALT: 9 U/L (ref 0–53)
AST: 13 U/L (ref 0–37)
Albumin: 3.7 g/dL (ref 3.5–5.2)
Alkaline Phosphatase: 97 U/L (ref 39–117)
Bilirubin, Direct: 0.1 mg/dL (ref 0.0–0.3)
Total Bilirubin: 0.8 mg/dL (ref 0.3–1.2)
Total Protein: 6.7 g/dL (ref 6.0–8.3)

## 2010-12-27 LAB — AMYLASE: Amylase: 32 U/L (ref 27–131)

## 2011-01-02 ENCOUNTER — Encounter
Admission: RE | Admit: 2011-01-02 | Discharge: 2011-01-02 | Payer: Self-pay | Source: Home / Self Care | Attending: Endocrinology | Admitting: Endocrinology

## 2011-01-04 ENCOUNTER — Telehealth: Payer: Self-pay | Admitting: Endocrinology

## 2011-01-04 DIAGNOSIS — R109 Unspecified abdominal pain: Secondary | ICD-10-CM | POA: Insufficient documentation

## 2011-01-09 ENCOUNTER — Ambulatory Visit: Payer: Self-pay | Admitting: Cardiology

## 2011-01-09 ENCOUNTER — Ambulatory Visit: Admit: 2011-01-09 | Payer: Self-pay | Admitting: Gastroenterology

## 2011-01-15 ENCOUNTER — Telehealth: Payer: Self-pay | Admitting: Internal Medicine

## 2011-01-21 LAB — CONVERTED CEMR LAB
Albumin: 3.9 g/dL (ref 3.5–5.2)
Alkaline Phosphatase: 112 units/L (ref 39–117)
Basophils Absolute: 0 10*3/uL (ref 0.0–0.1)
Bilirubin Urine: NEGATIVE
Calcium, Total (PTH): 9.1 mg/dL (ref 8.4–10.5)
Creatinine,U: 55.2 mg/dL
Eosinophils Absolute: 0.2 10*3/uL (ref 0.0–0.7)
Folate: 16 ng/mL
Hemoglobin: 13.6 g/dL (ref 13.0–17.0)
Lymphocytes Relative: 24.7 % (ref 12.0–46.0)
MCHC: 33.9 g/dL (ref 30.0–36.0)
Microalb, Ur: 4.1 mg/dL — ABNORMAL HIGH (ref 0.0–1.9)
Neutro Abs: 2.9 10*3/uL (ref 1.4–7.7)
Neutrophils Relative %: 61.7 % (ref 43.0–77.0)
PTH: 16.4 pg/mL (ref 14.0–72.0)
Platelets: 184 10*3/uL (ref 150.0–400.0)
RDW: 13.6 % (ref 11.5–14.6)
Total Protein, Urine: NEGATIVE mg/dL
Urine Glucose: >=1000 mg/dL
Vitamin B-12: 383 pg/mL (ref 211–911)

## 2011-01-24 NOTE — Progress Notes (Signed)
Summary: rx refill req  Phone Note Refill Request Message from:  Fax from Pharmacy on October 09, 2010 3:48 PM  Refills Requested: Medication #1:  COZAAR 50 MG TABS 1 by mouth once daily (generic)   Dosage confirmed as above?Dosage Confirmed   Last Refilled: 06/14/2010  Method Requested: Electronic Initial call taken by: Brenton Grills MA,  October 09, 2010 3:50 PM    Prescriptions: COZAAR 50 MG TABS (LOSARTAN POTASSIUM) 1 by mouth once daily (generic)  #90 x 3   Entered by:   Brenton Grills MA   Authorized by:   Minus Breeding MD   Signed by:   Brenton Grills MA on 10/09/2010   Method used:   [facility]        [facility]  [place] [facility]       [address]       [place], [place]  [address]       Ph: [phone] or [phone]       Fax: [phone]   RxID:   [id number]

## 2011-01-24 NOTE — Progress Notes (Signed)
Summary: Pharmacy change  Phone Note Refill Request Message from:  Patient on November 29, 2010 11:19 AM  Refills Requested: Medication #1:  HUMALOG KWIKPEN 100 UNIT/ML SOLN 7 units three times a day (just before each meal)   Dosage confirmed as above?Dosage Confirmed  Medication #2:  LANTUS SOLOSTAR 100 UNIT/ML SOLN 25 units each am.   Dosage confirmed as above?Dosage Confirmed Pharmacy change [facility]   Method Requested: Electronic Initial call taken by: Margaret Pyle, CMA,  November 29, 2010 11:19 AM    Prescriptions: LANTUS SOLOSTAR 100 UNIT/ML SOLN (INSULIN GLARGINE) 25 units each am  #1 box x 11   Entered by:   Margaret Pyle, CMA   Authorized by:   Minus Breeding MD   Signed by:   Margaret Pyle, CMA on 11/29/2010   Method used:   [facility]        [facility]  [id number]       [address]       [place], [place]  [address]       Ph: [phone] or [phone]       Fax: [phone]   RxID:   [id number] HUMALOG KWIKPEN 100 UNIT/ML SOLN (INSULIN LISPRO (HUMAN)) 7 units three times a day (just before each meal)  #1 box x 3   Entered by:   Margaret Pyle, CMA   Authorized by:   Minus Breeding MD   Signed by:   Margaret Pyle, CMA on 11/29/2010   Method used:   Electronically [facility]        [facility]  [place] (retail)       [address]       [place], [place]  [address]       Ph: [phone] or [phone]       Fax: [phone]   RxID:   [id number]

## 2011-01-24 NOTE — Letter (Signed)
Summary: Previsit letter  [facility]  [address] [place], [place] [address]   Phone: [phone]  Fax: [phone]       02/15/2010 MRN: [id number]  Dylan Quinn [address] [place], [place]  [address]  Dear Mr. Claiborne,  Welcome to the Gastroenterology Division at [facility].    You are scheduled to see a nurse for your pre-procedure visit on 02/21/2010 at 3:30PM on the 3rd floor at [facility], [address]  We ask that you try to arrive at our office 15 minutes prior to your appointment time to allow for check-in.  Your nurse visit will consist of discussing your medical and surgical history, your immediate family medical history, and your medications.    Please bring a complete list of all your medications or, if you prefer, bring the medication bottles and we will list them.  We will need to be aware of both prescribed and over the counter drugs.  We will need to know exact dosage information as well.  If you are on blood thinners (Coumadin, Plavix, Aggrenox, Ticlid, etc.) please call our office today/prior to your appointment, as we need to consult with your physician about holding your medication.   Please be prepared to read and sign documents such as consent forms, a financial agreement, and acknowledgement forms.  If necessary, and with your consent, a friend or relative is welcome to sit-in on the nurse visit with you.  Please bring your insurance card so that we may make a copy of it.  If your insurance requires a referral to see a specialist, please bring your referral form from your primary care physician.  No co-pay is required for this nurse visit.     If you cannot keep your appointment, please call [phone] to cancel or reschedule prior to your appointment date.  This allows [place] the opportunity to schedule an appointment for another patient in need of care.    Thank you for choosing [facility] for your  medical needs.  We appreciate the opportunity to care for you.  Please visit [place] at our website  to learn more about our practice.                     Sincerely.                                                                                                                   The Gastroenterology Division

## 2011-01-24 NOTE — Progress Notes (Signed)
  Phone Note Refill Request Message from:  Fax from Pharmacy on February 14, 2010 4:49 PM  Refills Requested: Medication #1:  HUMALOG 100 UNIT/ML  SOLN as dir in pump   Dosage confirmed as above?Dosage Confirmed  Medication #2:  CLOBETASOL PROPIONATE 0.05 %  CREA three times a day as needed itching  disp 1 med tube   Dosage confirmed as above?Dosage Confirmed  Medication #3:  COZAAR 50 MG TABS 1 by mouth once daily (generic).   Dosage confirmed as above?Dosage Confirmed Initial call taken by: Josph Macho RMA,  February 14, 2010 4:50 PM    Prescriptions: CLOBETASOL PROPIONATE 0.05 %  CREA (CLOBETASOL PROPIONATE) three times a day as needed itching  disp 1 med tube  #1 month x 1   Entered and Authorized by:   Josph Macho RMA   Signed by:   Josph Macho RMA on 02/14/2010   Method used:   F[facility]ed to ...       [facility]  [place] (retail)       [address]       [place], [place]  [address]       Ph: [phone] or [phone]       Fax: [phone]   RxID:   [id number] COZAAR 50 MG TABS (LOSARTAN POTASSIUM) 1 by mouth once daily (generic)  #90 x 1   Entered and Authorized by:   Josph Macho RMA   Signed by:   Josph Macho RMA on 02/14/2010   Method used:   Faxed to ...       [facility]  [place] (retail)       [address]       [place], [place]  [address]       Ph: [phone] or [phone]       Fax: [phone]   RxID:   [id number] HUMALOG 100 UNIT/ML  SOLN (INSULIN LISPRO (HUMAN)) as dir in pump, avg 45 units/day  #1 month x 3   Entered and Authorized by:   Josph Macho RMA   Signed by:   Josph Macho RMA on 02/14/2010   Method used:   Faxed to ...       [facility]  [place] (retail)       [address]       [place], [place]  [address]       Ph: [phone] or [phone]       Fax: [phone]   RxID:   [id number]

## 2011-01-24 NOTE — Progress Notes (Signed)
Summary: ICU  Phone Note Call from Patient   Caller: Spouse Summary of Call: Pt's spouse called to inform MD that opt is in ICU at a Hospital in [place]. Spouse did not leave a return phone number but she did state that MD at hospital was told that SAE is pt's PCP. Initial call taken by: Margaret Pyle, CMA,  July 25, 2010 4:02 PM  Follow-up for Phone Call        noted, thank you Follow-up by: Minus Breeding MD,  July 25, 2010 4:20 PM

## 2011-01-24 NOTE — Letter (Signed)
Summary: [facility]   Imported By: Sherian Rein 03/06/2010 08:39:49  _____________________________________________________________________  External Attachment:    Type:   Image     Comment:   External Document

## 2011-01-24 NOTE — Letter (Signed)
Summary: Referral - not able to see patient  [facility]  [address] [place], [place] [address]   Phone: [phone]  Fax: [phone]    March 10, 2010    Shyan Scalisi A. Everardo All, M.D. [address] [place], [place] [address]    Re:   Dylan Quinn DOB:  Jul 06, 1936 MRN:   [id number]    Dear Dr. Everardo All:  Thank you for your kind referral of the above patient.  We have attempted to schedule the recommended procedure Screening Colonoscopy but have not been able to schedule because:   X  The patient was not available by phone and/or has not returned our calls.  ___ The patient declined to schedule the procedure at this time.  We appreciate the referral and hope that we will have the opportunity to treat this patient in the future.    Sincerely,    [facility] [phone]

## 2011-01-24 NOTE — Assessment & Plan Note (Signed)
Summary: FEVER/COUGH/BS PROBLEMS/ NWS  SIDE DOOR?   Vital Signs:  Patient profile:   75 year old male Height:      73.5 inches (186.69 cm) Weight:      226 pounds (102.73 kg) O2 Sat:      97 % on Room air Temp:     97.4 degrees F (36.33 degrees C) oral Pulse rate:   90 / minute BP sitting:   118 / 72  (left arm) Cuff size:   large  Vitals Entered By: Josph Macho RMA (March 10, 2010 1:51 PM)  O2 Flow:  Room air CC: Fever and cough X4days, BS is spiking, Diarrhea X1day, Weak X3days/ CF Is Patient Diabetic? Yes   CC:  Fever and cough X4days, BS is spiking, Diarrhea X1day, and Weak X3days/ CF.  History of Present Illness: pt states 3 days of prod-quality cough, and moderate pain in the throat.  associated fever is better. no cbg record, but states cbg's are "generally ok, but sometimes as high as 400."  Current Medications (verified): 1)  Insulin Pump Ir1250   Kit (Insulin Infusion Pump) .... Use As Directed 2)  Adult Aspirin Low Strength 81 Mg  Tbdp (Aspirin) 3)  Flomax 0.4 Mg  Cp24 (Tamsulosin Hcl) .... Take 1 By Mouth Two Times A Day Qd 4)  Clomiphene Citrate 50 Mg  Tabs (Clomiphene Citrate) .... Take 1/4 Tab Once Daily 5)  Humalog 100 Unit/ml  Soln (Insulin Lispro (Human)) .... As Dir in Pump, Avg 45 Units/day 6)  Clobetasol Propionate 0.05 %  Crea (Clobetasol Propionate) .... Three Times A Day As Needed Itching  Disp 1 Med Tube 7)  Onetouch Ultra Test  Strp (Glucose Blood) .... 6x/day, and Lancets 250.01 Variable Glucoses, Insulin Pump 8)  Cozaar 50 Mg Tabs (Losartan Potassium) .Marland Kitchen.. 1 By Mouth Once Daily (Generic) 9)  Hydrocodone-Acetaminophen 5-500 Mg Tabs (Hydrocodone-Acetaminophen) .Marland Kitchen.. 1 Tab By Mouth Every 6 Hours As Needed For Pain  Allergies (verified): No Known Drug Allergies  Past History:  Past Medical History: Last updated: 07/19/2009 Dyslipidemia Leukopenia Psoriasis  HYPERTENSION (ICD-401.9) DEPRESSION (ICD-311) HYPOGONADISM, MALE (ICD-257.2) OTHER  OSTEOPOROSIS (ICD-733.09) PERIPHERAL NEUROPATHY (ICD-356.9) DIABETES MELLITUS, TYPE I (ICD-250.01) glaucoma Benign prostatic hypertrophy  Review of Systems  The patient denies hypoglycemia and dyspnea on exertion.    Physical Exam  General:  normal appearance.   Head:  head: no deformity eyes: no periorbital swelling, no proptosis external nose and ears are normal mouth: no lesion seen Ears:  left tm is red.  right is normal Additional Exam:  CHEST - 2 VIEW there is no evidence for pneumonia.   Impression & Recommendations:  Problem # 1:  DIABETES MELLITUS, TYPE I (ICD-250.01) needs increased rx  Problem # 2:  COUGH (ICD-786.2) Assessment: New  Medications Added to Medication List This Visit: 1)  Cefuroxime Axetil 250 Mg Tabs (Cefuroxime axetil) .Marland Kitchen.. 1 tab two times a day 2)  Benzonatate 200 Mg Caps (Benzonatate) .Marland Kitchen.. 1 tab three times a day as needed for cough  Other Orders: T-2 View CXR (71020TC) Est. Patient Level IV ([address])  Patient Instructions: 1)  chest x ray is being ordered for you today.  a few days after the test(s), please call [phone] to hear your test results. 2)  cefuronime 250 mg two times a day 3)  loratadine-d as needed for congestion. 4)  continue basal rate of 0.4 units/hr.   5)  continue mealtime boluses of 13-15-16 (if activity is anticipated, subtract 4 units from that bolus).  6)  also take 5 units with bedtime snack (although please note there is no medical reason to eat a bedtime snack).  7)  whenever you blood sugar is high, take extra boluses, as often as every 2 hrs if necessary: 8)  200's:  2 units 9)  300's:  4 units 10)  over 400:  6 units Prescriptions: CEFUROXIME AXETIL 250 MG TABS (CEFUROXIME AXETIL) 1 tab two times a day  #14 x 0   Entered and Authorized by:   Minus Breeding MD   Signed by:   Minus Breeding MD on 03/10/2010   Method used:   [facility]        [facility]  [place]       [address]       [place], [place]  [address]       Ph: [phone] or [phone]       Fax: [phone]   RxID:   [id number] BENZONATATE 200 MG CAPS (BENZONATATE) 1 tab three times a day as needed for cough  #30 x 1   Entered and Authorized by:   Minus Breeding MD   Signed by:   Minus Breeding MD on 03/10/2010   Method used:   Electronically [facility]        [facility]  [place] (retail)       [address]       [place], [place]  [address]       Ph: [phone] or [phone]       Fax: [phone]   RxID:   [id number] CEFUROXIME AXETIL 250 MG TABS (CEFUROXIME AXETIL) 1 tab two times a day  #14 x 0   Entered and Authorized by:   Minus Breeding MD   Signed by:   Minus Breeding MD on 03/10/2010   Method used:   Electronically to        [facility]. [phone]* (retail)       [phone] [address]       [place], [place]  [address]       Ph: [phone]       Fax: [phone]   RxID:   [id number]

## 2011-01-24 NOTE — Assessment & Plan Note (Signed)
Summary: f/u appt/#/cd   Vital Signs:  Patient profile:   75 year old male Height:      73.5 inches (186.69 cm) Weight:      228 pounds (103.64 kg) BMI:     29.78 O2 Sat:      97 % on Room air Temp:     97.6 degrees F (36.44 degrees C) oral Pulse rate:   69 / minute BP sitting:   132 / 82  (left arm) Cuff size:   regular  Vitals Entered By: Brenton Grills CMA Duncan Dull) (November 24, 2010 1:44 PM)  O2 Flow:  Room air CC: Follow-up visit/dull pain on right side/referral for colonoscopy/aj Is Patient Diabetic? Yes   CC:  Follow-up visit/dull pain on right side/referral for colonoscopy/aj.  History of Present Illness: pt is here for medicare welllness visit.  he denies memory loss and depression.  he says she is able to perform activities of daily living without assistance.  he has no limitations to physical activity.   Current Medications (verified): 1)  Adult Aspirin Low Strength 81 Mg  Tbdp (Aspirin) 2)  Humalog Kwikpen 100 Unit/ml Soln (Insulin Lispro (Human)) .... 4 Units Three Times A Day (Just Before Each Meal) 3)  Clobetasol Propionate 0.05 %  Crea (Clobetasol Propionate) .... Three Times A Day As Needed Itching  Disp 1 Med Tube 4)  Onetouch Ultra Test  Strp (Glucose Blood) .... 6x/day, and Lancets 250.01 Variable Glucoses, Insulin Pump 5)  Cozaar 50 Mg Tabs (Losartan Potassium) .Marland Kitchen.. 1 By Mouth Once Daily (Generic) 6)  Hydrocodone-Acetaminophen 5-500 Mg Tabs (Hydrocodone-Acetaminophen) .Marland Kitchen.. 1 Tab By Mouth Every 6 Hours As Needed For Pain 7)  Lantus 100 Unit/ml Soln (Insulin Glargine) .... 28 Units in The Morning Every Day  Allergies (verified): No Known Drug Allergies  Past History:  Past Medical History: Dyslipidemia Leukopenia Psoriasis  HYPERTENSION (ICD-401.9) DEPRESSION (ICD-311) HYPOGONADISM, MALE (ICD-257.2) OTHER OSTEOPOROSIS (ICD-733.09) PERIPHERAL NEUROPATHY (ICD-356.9) DIABETES MELLITUS, TYPE I (ICD-250.01) glaucoma Benign prostatic  hypertrophy  opthal: dr Elmer Picker  Social History: Reviewed history from 07/19/2009 and no changes required. 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    Physical Exam  General:  normal appearance.   Eyes:  (sees opthal) Ears:  rossly normal hearing.   Neck:  Supple without thyroid enlargement or tenderness.  Heart:  Regular rate and rhythm without murmurs or gallops noted. Normal S1,S2.   Abdomen:  abdomen is soft, nontender.  no hepatosplenomegaly.   not distended.  no hernia  Rectal:  normal external and internal exam.  heme neg  Prostate:  Normal size prostate without masses or tenderness.  Msk:  pt easily and quickly performs "get-up-and-go" from a sitting position. Neurologic:  cn 2-12 grossly intact.   readily moves all 4's.   sensation is intact to touch on the feet  Skin:  normal texture and temp.  no rash.  not diaphoretic  Cervical Nodes:  No significant adenopathy.  Psych:  remembers 3/3 at 5 minutes.  excellent recall.  can easily read and write a sentence.  alert and oriented x 3 Additional Exam:  SEPARATE EVALUATION FOLLOWS--EACH PROBLEM HERE IS NEW, NOT RESPONDING TO TREATMENT, OR POSES SIGNIFICANT RISK TO THE PATIENT'S HEALTH: HISTORY OF THE PRESENT ILLNESS: no cbg record, but states cbg's vary from 50-300.  it is in general higher later in the day, than in  am.  he says he feels better when it is in the 200's, than when it is 100's.  PAST MEDICAL HISTORY reviewed and up to date today REVIEW OF SYSTEMS: denies hypoglycemia PHYSICAL EXAMINATION: no deformity.  no ulcer on the feet.  feet are of normal color and temp.  no edema dorsalis pedis intact bilat.  no carotid bruit clear to auscultation.  no respiratory distress LAB/XRAY RESULTS: IMPRESSION: dm.  poor control PLAN: see instruction sheet    Impression &  Recommendations:  Problem # 1:  ROUTINE GENERAL MEDICAL EXAM@HEALTH  CARE FACL (ICD-V70.0)  Medications Added to Medication List This Visit: 1)  Humalog Kwikpen 100 Unit/ml Soln (Insulin lispro (human)) .... 7 units three times a day (just before each meal) 2)  Lantus 100 Unit/ml Soln (Insulin glargine) .... 25 units in the morning every day  Other Orders: Gastroenterology Referral (GI) T-Prolactin [id number]) TLB-A1C / Hgb A1C (Glycohemoglobin) (83036-A1C) TLB-Microalbumin/Creat Ratio, Urine (82043-MALB) TLB-Luteinizing Hormone (LH) (83002-LH) TLB-FSH (Follicle Stimulating Hormone) (83001-FSH) TLB-Testosterone, Total (84403-TESTO) TLB-B12 + Folate Pnl (09811_91478-G95/AOZ) Est. Patient 65& > ([address]) Est. Patient Level III (78469)   Patient Instructions: 1)  refer for colonoscopy.  you will be called with a day and time for an appointment. 2)  reduce lantus to 25 units once daily. 3)  increase humalog to 7 units three times a day (just before each meal). 4)  check your blood sugar 2 times a day.  vary the time of day when you check, between before the 3 meals, and at bedtime.  also check if you have symptoms of your blood sugar being too high or too low.  please keep a record of the readings and bring it to your next appointment here.  please call us sooner if you are having low blood sugar episodes. 5)  please consider these measures for your health:  minimize alcohol.  do not use tobacco products.  have a colonoscopy at least every 10 years from age 3.  keep firearms safely stored.  always use seat belts.  have working smoke alarms in your home.  see an eye doctor and dentist regularly.  never drive under the influence of alcohol or drugs (including prescription drugs).   6)  please let me know what your wishes would be, if artificial life support measures should become necessary.  it is critically important to prevent falling down (keep floor areas well-lit, dry, and free of loose  objects) 7)  Please schedule a follow-up appointment in 3 months. Prescriptions: HUMALOG KWIKPEN 100 UNIT/ML SOLN (INSULIN LISPRO (HUMAN)) 7 units three times a day (just before each meal)  #1 box x 3   Entered and Authorized by:   Minus Breeding MD   Signed by:   Minus Breeding MD on 11/24/2010   Method used:   Print then Give to Patient   RxID:   [id number] LANTUS 100 UNIT/ML SOLN (INSULIN GLARGINE) 25 units in the morning every day  #3 vials x 33   Entered and Authorized by:   Minus Breeding MD   Signed by:   Minus Breeding MD on 11/24/2010   Method used:   [facility]        [facility]  [place] ([facility]ail)       [address]       [place], [place]  [address]       Ph: [phone] or [phone]       Fax: [phone]   RxID:   [id number]  50 MG TABS (LOSARTAN POTASSIUM) 1 by mouth once daily (generic)  #90 x 3   Entered and Authorized by:   Minus Breeding MD   Signed by:   Minus Breeding MD on 11/24/2010   Method used:   Electronically [facility]        [facility]  [place])       [address]       [place], [place]  [address]       Ph: [phone] or [phone]       Fax: [phone]   RxID:   [id number] HUMALOG KWIKPEN 100 UNIT/ML SOLN (INSULIN LISPRO (HUMAN)) 7 units three times a day (just before each meal)  #2 boxes x 3   Entered and Authorized by:   Minus Breeding MD   Signed by:   Minus Breeding MD on 11/24/2010   Method used:   [facility]ically [facility]        [facility]  [place]ail)       [address]       [place], [place]  [address]       Ph: [phone] or [phone]       Fax: [phone]   RxID:   [id number] HUMALOG KWIKPEN 100 UNIT/ML SOLN (INSULIN LISPRO (HUMAN)) 7 units three times a day (just before each meal)  #1 box x 11   Entered and Authorized by:   Minus Breeding MD   Signed by:   Minus Breeding MD on 11/24/2010   Method used:   Print then Give to Patient   RxID:    [id number]    Orders Added: 1)  Gastroenterology Referral [GI] 2)  T-Prolactin [10175-10258] 3)  TLB-A1C / Hgb A1C (Glycohemoglobin) [83036-A1C] 4)  TLB-Microalbumin/Creat Ratio, Urine [82043-MALB] 5)  TLB-Luteinizing Hormone (LH) [83002-LH] 6)  TLB-FSH (Follicle Stimulating Hormone) [83001-FSH] 7)  TLB-Testosterone, Total [84403-TESTO] 8)  TLB-B12 + Folate Pnl [82746_82607-B12/FOL] 9)  Est. Patient 65& > [99397] 10)  Est. Patient Level III [52778]

## 2011-01-24 NOTE — Progress Notes (Signed)
Summary: Rx request  Phone Note Call from Patient   Caller: Patient Summary of Call: pt called [facility] [place] Initial call taken by: Margaret Pyle, CMA,  April 10, 2010 1:09 PM    Prescriptions: HUMALOG 100 UNIT/ML  SOLN (INSULIN LISPRO (HUMAN)) as dir in pump, avg 45 units/day  #28mth x 3   Entered by:   Margaret Pyle, CMA   Authorized by:   Minus Breeding MD   Signed by:   Margaret Pyle, CMA on 04/10/2010   Method used:   Electronically to        [facility]  [place] (retail)       [address]       [place], [place]  [address]       Ph: [phone] or [phone]       Fax: [phone]   RxID:   [id number]

## 2011-01-24 NOTE — Progress Notes (Signed)
Summary: [facility]  Phone Note [employer] of Call: Faxed completed paperwork to [facility] and sent a copy to be scanned. Initial call taken by: Josph Macho RMA,  May 01, 2010 12:50 PM

## 2011-01-24 NOTE — Medication Information (Signed)
Summary: Diabetes Testing [facility]  [facility]   Imported By: Sherian Rein 05/03/2010 12:09:01  _____________________________________________________________________  External Attachment:    Type:   Image     Comment:   External Document

## 2011-01-24 NOTE — Progress Notes (Signed)
Summary: Rx change  Phone Note Call from Patient Call back at Home Phone [phone]   Caller: Spouse Summary of Call: Pt's spouse called stating Rxs for Insulin-Humolog and Lantus were recieved but were in vials and pt has been using the flexpen. Pt is requesting Rxs be resent for correct form of insulin. Initial call taken by: Margaret Pyle, CMA,  November 27, 2010 1:44 PM  Follow-up for Phone Call        sent Follow-up by: Minus Breeding MD,  November 27, 2010 3:06 PM    New/Updated Medications: LANTUS SOLOSTAR 100 UNIT/ML SOLN (INSULIN GLARGINE) 25 units each am Prescriptions: HUMALOG KWIKPEN 100 UNIT/ML SOLN (INSULIN LISPRO (HUMAN)) 7 units three times a day (just before each meal)  #1 box x 3   Entered and Authorized by:   Minus Breeding MD   Signed by:   Minus Breeding MD on 11/27/2010   Method used:   Electronically to        [facility]. [phone]* (retail)       [phone]       [place], [place]  [address]       Ph: [phone]       Fax: [phone]   RxID:   [id number] LANTUS SOLOSTAR 100 UNIT/ML SOLN (INSULIN GLARGINE) 25 units each am  #1 box x 11   Entered and Authorized by:   Minus Breeding MD   Signed by:   Minus Breeding MD on 11/27/2010   Method used:   Electronically to        [facility]. [phone]* (retail)       [phone]       [place], [place]  [address]       Ph: [phone]       Fax: [phone]   RxID:   [id number]

## 2011-01-24 NOTE — Letter (Signed)
Summary: [facility]   Imported By: Lester  11/02/2010 07:58:28  _____________________________________________________________________  External Attachment:    Type:   Image     Comment:   External Document

## 2011-01-24 NOTE — Miscellaneous (Signed)
[facility]   Imported By: Lester [place] 03/17/2010 09:28:30  _____________________________________________________________________  External Attachment:    Type:   Image     Comment:   External Document

## 2011-01-24 NOTE — Medication Information (Signed)
Summary: [facility]  [facility]   Imported By: Sherian Rein 05/03/2010 12:10:21  _____________________________________________________________________  External Attachment:    Type:   Image     Comment:   External Document

## 2011-01-24 NOTE — Assessment & Plan Note (Signed)
Summary: f/u appt/#/cd   Vital Signs:  Patient profile:   75 year old male Height:      73.5 inches (186.69 cm) Weight:      229.38 pounds (104.26 kg) O2 Sat:      97 % on Room air Temp:     97.0 degrees F (36.11 degrees C) oral Pulse rate:   84 / minute BP sitting:   172 / 100  (left arm) Cuff size:   large  Vitals Entered By: Josph Macho RMA (February 14, 2010 4:10 PM)  O2 Flow:  Room air CC: Follow-up visit/ pt states he is not taking Fosamax or Buspar/ CF Is Patient Diabetic? Yes   CC:  Follow-up visit/ pt states he is not taking Fosamax or Buspar/ CF.  History of Present Illness: no cbg record, but states cbg's are "slightly better recently."  he takes a total of approx 55 units/day, via the pump.  he has hypoglycemia almost daily. he takes clomiphine as rx'ed.   pt's wife says he has hearing loss.   pt states slight numbness of the feet.  Current Medications (verified): 1)  Insulin Pump Ir1250   Kit (Insulin Infusion Pump) .... Use As Directed 2)  Adult Aspirin Low Strength 81 Mg  Tbdp (Aspirin) 3)  Flomax 0.4 Mg  Cp24 (Tamsulosin Hcl) .... Take 1 By Mouth Two Times A Day Qd 4)  Clomiphene Citrate 50 Mg  Tabs (Clomiphene Citrate) .... Take 1/4 Tab Three Times A Week 5)  Fosamax 70 Mg  Tabs (Alendronate Sodium) .... 2 Tabs Q Month 6)  Buspar 10 Mg  Tabs (Buspirone Hcl) .... Bid 7)  Humalog 100 Unit/ml  Soln (Insulin Lispro (Human)) .... As Dir in Pump, Avg 45 Units/day 8)  Clobetasol Propionate 0.05 %  Crea (Clobetasol Propionate) .... Three Times A Day As Needed Itching  Disp 1 Med Tube 9)  Onetouch Ultra Test  Strp (Glucose Blood) .... 6x/day, and Lancets 250.01 Variable Glucoses, Insulin Pump 10)  Cozaar 50 Mg Tabs (Losartan Potassium) .Marland Kitchen.. 1 By Mouth Once Daily (Generic)  Allergies (verified): No Known Drug Allergies  Past History:  Past Medical History: Last updated: 07/19/2009 Dyslipidemia Leukopenia Psoriasis  HYPERTENSION (ICD-401.9) DEPRESSION  (ICD-311) HYPOGONADISM, MALE (ICD-257.2) OTHER OSTEOPOROSIS (ICD-733.09) PERIPHERAL NEUROPATHY (ICD-356.9) DIABETES MELLITUS, TYPE I (ICD-250.01) glaucoma Benign prostatic hypertrophy  Review of Systems  The patient denies syncope.         no probs at insulin injection sites.  Physical Exam  General:  repeat bp 150/80 by me. normal appearance.   Ears:  TM's intact and clear with normal canals with grossly normal hearing.   Skin:  insulin infusion sites at anterior abdomen are normal    Impression & Recommendations:  Problem # 1:  DIABETES MELLITUS, TYPE I (ICD-250.01) i need more cbg info in order to safely increase the insulin  Problem # 2:  hearing loss new  Problem # 3:  HYPERTENSION (ICD-401.9) ? situational component  Other Orders: TLB-A1C / Hgb A1C (Glycohemoglobin) (83036-A1C) TLB-Testosterone, Total (84403-TESTO) TLB-B12, Serum-Total ONLY (04540-J81) Gastroenterology Referral (GI) Est. Patient Level IV (19147)  Patient Instructions: 1)  check your blood glucose 6 times a day.  vary the time of day between before the 3 meals and at bedtime.  also check if you feel as though your glucose might be very high or too low.  bring a record of this to your doctor appointments. 2)  continue basal rate of 0.4 units/hr. 3)  increase mealtime boluses to  12-06-14 (if activity is anticipated, subtract 4 units from that bolus). 4)  also take 5 units with bedtime snack (although please note there is no medical reason to eat a bedtime snack). 5)  Please schedule a follow-up appointment in 3 months. 6)  redouble diet efforts. 7)  we'll follow the bp for now. 8)  audiology referral is declined 9)  blood tests today.

## 2011-01-24 NOTE — Progress Notes (Signed)
Summary: pen needles  Phone Note Call from Patient Call back at Home Phone [phone]   Caller: Patient Summary of Call: Pt needs rx for pen needles [facility] Initial call taken by: Brenton Grills CMA Duncan Dull),  November 24, 2010 3:25 PM  Follow-up for Phone Call        left message for pt to callback office Follow-up by: Brenton Grills CMA Duncan Dull),  November 24, 2010 4:03 PM  Additional Follow-up for Phone Call Additional follow up Details #1::        pt's spouse informed Additional Follow-up by: Brenton Grills CMA Duncan Dull),  November 27, 2010 9:49 AM    New/Updated Medications: BD PEN NEEDLE NANO U/F 32G X 4 MM MISC (INSULIN PEN NEEDLE) use as directed dx 250.01 Prescriptions: BD PEN NEEDLE NANO U/F 32G X 4 MM MISC (INSULIN PEN NEEDLE) use as directed dx 250.01  #400 x 3   Entered by:   Brenton Grills CMA (AAMA)   Authorized by:   Minus Breeding MD   Signed by:   Brenton Grills CMA (AAMA) on 11/24/2010   Method used:   [facility]  [address] ([facility]ail)       [address]       [place], [place]  [address]       Ph: [phone] or [phone]       Fax: [phone]   RxID:   [id number]

## 2011-01-24 NOTE — Assessment & Plan Note (Signed)
Summary: POST HOSPITAL/BACK AND LEFT LEG PAIN/LB   Vital Signs:  Patient profile:   75 year old male Height:      73.5 inches (186.69 cm) Weight:      233.13 pounds (105.97 kg) O2 Sat:      97 % on Room air Temp:     97 degrees F (36.11 degrees C) oral Pulse rate:   69 / minute BP sitting:   134 / 80  (left arm) Cuff size:   large  Vitals Entered By: Sydell Axon (February 23, 2010 1:56 PM)  O2 Flow:  Room air CC: post hospital/ back and left leg pain/ [place] Is Patient Diabetic? Yes   CC:  post hospital/ back and left leg pain/ [place].  History of Present Illness: pt was seen at [facility] for few days of severe pain at the left loower back, with associated radiation of the pain to the lateral aspect of the left thigh.  no local injury.  he was rx'ed flexeril, vicodin, and mobic.  the pills help, but he continues to require them.   pt says the clomid does not help him feel better in general. no cbg record, but states cbg's are "high."  Current Medications (verified): 1)  Insulin Pump Ir1250   Kit (Insulin Infusion Pump) .... Use As Directed 2)  Adult Aspirin Low Strength 81 Mg  Tbdp (Aspirin) 3)  Flomax 0.4 Mg  Cp24 (Tamsulosin Hcl) .... Take 1 By Mouth Two Times A Day Qd 4)  Clomiphene Citrate 50 Mg  Tabs (Clomiphene Citrate) .... Take 1/4 Tab Three Times A Week 5)  Humalog 100 Unit/ml  Soln (Insulin Lispro (Human)) .... As Dir in Pump, Avg 45 Units/day 6)  Clobetasol Propionate 0.05 %  Crea (Clobetasol Propionate) .... Three Times A Day As Needed Itching  Disp 1 Med Tube 7)  Onetouch Ultra Test  Strp (Glucose Blood) .... 6x/day, and Lancets 250.01 Variable Glucoses, Insulin Pump 8)  Cozaar 50 Mg Tabs (Losartan Potassium) .Marland Kitchen.. 1 By Mouth Once Daily (Generic) 9)  Cyclobenzaprine Hcl 10 Mg Tabs (Cyclobenzaprine Hcl) .Marland Kitchen.. 1 Tab By Mouth Three Times A Day 10)  Meloxicam 15 Mg Tabs (Meloxicam) .Marland Kitchen.. 1 Tab By Mouth Once Daily 11)  Hydrocodone-Acetaminophen 5-500 Mg Tabs  (Hydrocodone-Acetaminophen) .Marland Kitchen.. 1 Tab By Mouth Q6hrs  Allergies (verified): No Known Drug Allergies  Past History:  Past Medical History: Last updated: 07/19/2009 Dyslipidemia Leukopenia Psoriasis  HYPERTENSION (ICD-401.9) DEPRESSION (ICD-311) HYPOGONADISM, MALE (ICD-257.2) OTHER OSTEOPOROSIS (ICD-733.09) PERIPHERAL NEUROPATHY (ICD-356.9) DIABETES MELLITUS, TYPE I (ICD-250.01) glaucoma Benign prostatic hypertrophy  Review of Systems       denies bowel or bladder retention.  Physical Exam  General:  normal appearance.   Msk:  back is nontender. Extremities:  strength is normal throughout the left lower extremity, except where limited by pain. Neurologic:  sensation is intact to touch on the lower extremities, except slightly reduced on the left lateral thigh. Additional Exam:  Hemoglobin A1C       [H]  10.6 %                      4.6-6.5 Testosterone         [L]  159.46 ng/dL    Impression & Recommendations:  Problem # 1:  LUMBAR RADICULOPATHY, LEFT (ICD-724.4) Assessment New  Problem # 2:  DIABETES MELLITUS, TYPE I (ICD-250.01) needs increased rx  Problem # 3:  HYPOGONADISM, MALE (ICD-257.2) needs increased rx  Medications Added to Medication List  This Visit: 1)  Clomiphene Citrate 50 Mg Tabs (Clomiphene citrate) .... Take 1/4 tab once daily 2)  Cyclobenzaprine Hcl 10 Mg Tabs (Cyclobenzaprine hcl) .Marland Kitchen.. 1 tab by mouth three times a day 3)  Meloxicam 15 Mg Tabs (Meloxicam) .Marland Kitchen.. 1 tab by mouth once daily 4)  Hydrocodone-acetaminophen 5-500 Mg Tabs (Hydrocodone-acetaminophen) .Marland Kitchen.. 1 tab by mouth every 6 hours as needed for pain 5)  Hydrocodone-acetaminophen 5-500 Mg Tabs (Hydrocodone-acetaminophen) .Marland Kitchen.. 1 tab by mouth q6hrs  Other Orders: TLB-A1C / Hgb A1C (Glycohemoglobin) (83036-A1C) TLB-Testosterone, Total (84403-TESTO) TLB-B12 + Folate Pnl ([id number]_29562-Z30/QMV) Radiology Referral (Radiology) Est. Patient Level IV (78469)  Patient Instructions: 1)  i  ordered mri.  you will be called with a day and time for an appointment. 2)  tests are being ordered for you today.  a few days after the test(s), please call [phone] to hear your test results. 3)  update: i left message on phone-tree:  4)  continue basal rate of 0.4 units/hr.   5)  increase mealtime boluses to 13-15-16 (if activity is anticipated, subtract 4 units from that bolus). 6)  also take 5 units with bedtime snack (although please note there is no medical reason to eat a bedtime snack).  7)  ret 30 days with cbg record. 8)  increase clomid to 1/4 of 50 mg once daily. Prescriptions: CLOMIPHENE CITRATE 50 MG  TABS (CLOMIPHENE CITRATE) take 1/4 tab once daily  #10 x 11   Entered and Authorized by:   Minus Breeding MD   Signed by:   Minus Breeding MD on 02/23/2010   Method used:   Electronically to        [facility]. [phone]* (retail)       [phone]       [place], [place]  [address]       Ph: [phone]       Fax: [phone]   RxID:   [id number] HYDROCODONE-ACETAMINOPHEN 5-500 MG TABS (HYDROCODONE-ACETAMINOPHEN) 1 tab by mouth every 6 hours as needed for pain  #50 x 2   Entered and Authorized by:   Minus Breeding MD   Signed by:   Minus Breeding MD on 02/23/2010   Method used:   Print then Give to Patient   RxID:   [id number]

## 2011-01-24 NOTE — Letter (Signed)
Summary: Pre Visit Letter Revised  [facility]  [address] [place], [place] [address]   Phone: [phone]  Fax: [phone]        11/28/2010 MRN: [id number] Dylan Quinn [address] [place], [place]  [address]             Procedure Date:  01-09-11   Welcome to the Gastroenterology Division at [facility].    You are scheduled to see a nurse for your pre-procedure visit on 12-26-10 at 2:30p.m. on the 3rd floor at [facility], [address]  We ask that you try to arrive at our office 15 minutes prior to your appointment time to allow for check-in.  Please take a minute to review the attached form.  If you answer "Yes" to one or more of the questions on the first page, we ask that you call the person listed at your earliest opportunity.  If you answer "No" to all of the questions, please complete the rest of the form and bring it to your appointment.    Your nurse visit will consist of discussing your medical and surgical history, your immediate family medical history, and your medications.   If you are unable to list all of your medications on the form, please bring the medication bottles to your appointment and we will list them.  We will need to be aware of both prescribed and over the counter drugs.  We will need to know exact dosage information as well.    Please be prepared to read and sign documents such as consent forms, a financial agreement, and acknowledgement forms.  If necessary, and with your consent, a friend or relative is welcome to sit-in on the nurse visit with you.  Please bring your insurance card so that we may make a copy of it.  If your insurance requires a referral to see a specialist, please bring your referral form from your primary care physician.  No co-pay is required for this nurse visit.     If you cannot keep your appointment, please call [phone] to cancel or reschedule prior to your appointment date.  This  allows [place] the opportunity to schedule an appointment for another patient in need of care.    Thank you for choosing [facility] for your medical needs.  We appreciate the opportunity to care for you.  Please visit [place] at our website  to learn more about our practice.  Sincerely, The Gastroenterology Division

## 2011-01-24 NOTE — Progress Notes (Signed)
  Phone Note Other Incoming   Request: Send information Summary of Call: Records received from [facility]. 38 pages forwarded to Dr. Everardo All for review.

## 2011-01-24 NOTE — Assessment & Plan Note (Signed)
Summary: WAS IN [facility]P [facility]   Vital Signs:  Patient profile:   75 year old male Height:      73.5 inches Weight:      220 pounds BMI:     28.74 O2 Sat:      96 % on Room air Temp:     97.8 degrees F oral Pulse rate:   64 / minute BP sitting:   140 / 100  (right arm) Cuff size:   regular  Vitals Entered By: Alysia Penna (October 05, 2010 2:58 PM)  O2 Flow:  Room air CC: pt here for hospital follow up. /cp sma Comments pt has discontinued the insulin pump, flomax, clomiphene citrate, cefuroxime, and benzonatate.   CC:  pt here for hospital follow up. /cp sma.  History of Present Illness: pt was hospitalized in [place], after he says his pump stopped working.  he says his cbg was high.  he was changed to lantus 28 units once daily, and humalog three times a day, and extra if cbg is high.   he says he was given a "formula," for his humalog, but he can't find it, and doesn't know what it is.  he thinks he takes humalog 4-8 units three times a day (just before each meal).  no cbg record, but states cbg's are sometimes low in the middle of the day.    Current Medications (verified): 1)  Insulin Pump Ir1250   Kit (Insulin Infusion Pump) .... Use As Directed 2)  Adult Aspirin Low Strength 81 Mg  Tbdp (Aspirin) 3)  Flomax 0.4 Mg  Cp24 (Tamsulosin Hcl) .... Take 1 By Mouth Two Times A Day Qd 4)  Clomiphene Citrate 50 Mg  Tabs (Clomiphene Citrate) .... Take 1/4 Tab Once Daily 5)  Humalog Kwikpen 100 Unit/ml Soln (Insulin Lispro (Human)) .... 4-8 Units At Meal Time 6)  Clobetasol Propionate 0.05 %  Crea (Clobetasol Propionate) .... Three Times A Day As Needed Itching  Disp 1 Med Tube 7)  Onetouch Ultra Test  Strp (Glucose Blood) .... 6x/day, and Lancets 250.01 Variable Glucoses, Insulin Pump 8)  Cozaar 50 Mg Tabs (Losartan Potassium) .Marland Kitchen.. 1 By Mouth Once Daily (Generic) 9)  Hydrocodone-Acetaminophen 5-500 Mg Tabs (Hydrocodone-Acetaminophen) .Marland Kitchen.. 1 Tab By Mouth Every 6  Hours As Needed For Pain 10)  Cefuroxime Axetil 250 Mg Tabs (Cefuroxime Axetil) .Marland Kitchen.. 1 Tab Two Times A Day 11)  Benzonatate 200 Mg Caps (Benzonatate) .Marland Kitchen.. 1 Tab Three Times A Day As Needed For Cough 12)  Lantus 100 Unit/ml Soln (Insulin Glargine) .... 28 Units in The Morning Every Day  Allergies (verified): No Known Drug Allergies  Past History:  Past Medical History: Last updated: 07/19/2009 Dyslipidemia Leukopenia Psoriasis  HYPERTENSION (ICD-401.9) DEPRESSION (ICD-311) HYPOGONADISM, MALE (ICD-257.2) OTHER OSTEOPOROSIS (ICD-733.09) PERIPHERAL NEUROPATHY (ICD-356.9) DIABETES MELLITUS, TYPE I (ICD-250.01) glaucoma Benign prostatic hypertrophy  Review of Systems  The patient denies syncope.    Physical Exam  General:  normal appearance.   Pulses:  dorsalis pedis intact bilat.   Extremities:  no deformity.  no ulcer on the feet.  feet are of normal color and temp.  no edema  Neurologic:  sensation is intact to touch on the feet    Impression & Recommendations:  Problem # 1:  DIABETES MELLITUS, TYPE I (ICD-250.01) rx limited by cognitive impairment.  i'll do the best i can  Medications Added to Medication List This Visit: 1)  Humalog Kwikpen 100 Unit/ml Soln (Insulin lispro (human)) .... 4 units  three times a day (just before each meal) 2)  Humalog Kwikpen 100 Unit/ml Soln (Insulin lispro (human)) .... 4-8 units at meal time 3)  Lantus 100 Unit/ml Soln (Insulin glargine) .... 28 units in the morning every day  Other Orders: Admin 1st Vaccine (14782) Flu Vaccine 63yrs + (95621) Est. Patient Level III (30865)  Patient Instructions: 1)  for now, continue lantus 28 units once daily, and humalog 4 units three times a day (just before each meal).  if blood sugar is over 250, take 1 extra unit of humalog.  2)  Please schedule a follow-up appointment in 2 weeks. 3)  please sign release of info for your recent hospital visit.   Prescriptions: ONETOUCH ULTRA TEST  STRP  (GLUCOSE BLOOD) 6x/day, and lancets 250.01 variable glucoses, insulin pump  #180/month x 11   Entered by:   Brenton Grills MA   Authorized by:   Minus Breeding MD   Signed by:   Brenton Grills MA on 10/05/2010   Method used:   [facility]  [address] [facility]ail)       [address]       [place], [place]  [address]       Ph: [phone] or [phone]       Fax: [phone]   RxID:   [id number]   Flu Vaccine Consent Questions     Do you have a history of severe allergic reactions to this vaccine? no    Any prior history of allergic reactions to egg and/or gelatin? no    Do you have a sensitivity to the preservative Thimersol? no    Do you have a past history of Guillan-Barre Syndrome? no    Do you currently have an acute febrile illness? no    Have you ever had a severe reaction to latex? no    Vaccine information given and explained to patient? yes    Are you currently pregnant? no    Lot Number:[id number]   Exp Date:06/23/2011   Site Given  Left Deltoid [id number]

## 2011-01-25 NOTE — Assessment & Plan Note (Signed)
Summary: f/u appt/cd   Vital Signs:  Patient profile:   75 year old male Height:      73.5 inches (186.69 cm) Weight:      219.25 pounds (99.66 kg) BMI:     28.64 O2 Sat:      97 % on Room air Temp:     98.4 degrees F (36.89 degrees C) oral Pulse rate:   70 / minute BP sitting:   116 / 80  (left arm) Cuff size:   regular  Vitals Entered By: Brenton Grills CMA Duncan Dull) (December 27, 2010 1:31 PM)  O2 Flow:  Room air CC: Pt c/o reoccuring pain on R side, low energy levels/aj Is Patient Diabetic? Yes   CC:  Pt c/o reoccuring pain on R side and low energy levels/aj.  History of Present Illness: pt states few weeks of moderate pain at the right flank.  no assoc hematuria.  not related to eating.    Current Medications (verified): 1)  Adult Aspirin Low Strength 81 Mg  Tbdp (Aspirin) 2)  Humalog Kwikpen 100 Unit/ml Soln (Insulin Lispro (Human)) .... 7 Units Three Times A Day (Just Before Each Meal) 3)  Clobetasol Propionate 0.05 %  Crea (Clobetasol Propionate) .... Three Times A Day As Needed Itching  Disp 1 Med Tube 4)  Onetouch Ultra Test  Strp (Glucose Blood) .... 6x/day, and Lancets 250.01 Variable Glucoses, Insulin Pump 5)  Cozaar 50 Mg Tabs (Losartan Potassium) .Marland Kitchen.. 1 By Mouth Once Daily (Generic) 6)  Hydrocodone-Acetaminophen 5-500 Mg Tabs (Hydrocodone-Acetaminophen) .Marland Kitchen.. 1 Tab By Mouth Every 6 Hours As Needed For Pain 7)  Bd Pen Needle Nano U/f 32g X 4 Mm Misc (Insulin Pen Needle) .... Use As Directed Dx 250.01 8)  Lantus Solostar 100 Unit/ml Soln (Insulin Glargine) .... 25 Units Each Am 9)  Moviprep 100 Gm  Solr (Peg-Kcl-Nacl-Nasulf-Na Asc-C) .... As Per Prep Instructions.  Allergies (verified): No Known Drug Allergies  Past History:  Past Medical History: Last updated: 11/24/2010 Dyslipidemia Leukopenia Psoriasis  HYPERTENSION (ICD-401.9) DEPRESSION (ICD-311) HYPOGONADISM, MALE (ICD-257.2) OTHER OSTEOPOROSIS (ICD-733.09) PERIPHERAL NEUROPATHY (ICD-356.9) DIABETES  MELLITUS, TYPE I (ICD-250.01) glaucoma Benign prostatic hypertrophy  opthal: dr Elmer Picker  Review of Systems  The patient denies fever.         denies n/v/d  Physical Exam  General:  normal appearance.   Chest Wall:  nontender Abdomen:  abdomen is soft, nontender.  no hepatosplenomegaly.   not distended.  no hernia there is a healed midline surgical scar Additional Exam:  White Cell Count     [L]  4.2 K/uL                    4.5-10.5   Red Cell Count            4.36 Mil/uL                 4.22-5.81   Hemoglobin                13.5 g/dL                   64.4-03.4   Hematocrit                39.6 %                      39.0-52.0   Platelet Count            199.0 K/uL  150.0-400.0   Sodium                    139 mEq/L                   135-145   Potassium                 4.4 mEq/L                   3.5-5.1   Chloride                  106 mEq/L                   96-112   Carbon Dioxide            26 mEq/L                    19-32   Glucose              [H]  227 mg/dL                   65-78   BUN                       19 mg/dL                    4-69   Creatinine                0.8 mg/dL                   6.2-9.5   Calcium                   9.3 mg/dL                   2.8-41.3    Total Bilirubin           0.8 mg/dL                   2.4-4.0   Direct Bilirubin          0.1 mg/dL                   1.0-2. Alkaline Phosphatase      97 U/L                      39-117   AST                       13 U/L                      0-37   ALT                       9 U/L                       0-53   Total Protein             6.7 g/dL                    7.2-5.3   Albumin                   3.7 g/dL  3.5-5.2    Color                     LT. YELLOW       RANGE:  Yellow;Lt. Yellow   Clarity                   CLEAR                       Clear   Specific Gravity          1.025                       1.000 - 1.030   Urine Ph                  6.0                          5.0-8.0   Protein                   30                          Negative   Urine Glucose             >=1000                      Negative    Impression & Recommendations:  Problem # 1:  FLANK PAIN, RIGHT (ICD-789.09) Assessment New uncertain etiology  Other Orders: Radiology Referral (Radiology) TLB-CBC Platelet - w/Differential (85025-CBCD) TLB-BMP (Basic Metabolic Panel-BMET) (80048-METABOL) TLB-Hepatic/Liver Function Pnl (80076-HEPATIC) TLB-Udip w/ Micro (81001-URINE) TLB-Amylase (82150-AMYL) Est. Patient Level IV ([address])  Patient Instructions: 1)  blood tests, and ultrasound, are being ordered for you today.  please call [phone] to hear your test results. 2)  call in 5 days if pain persists, even if tests are normal. 3)  please keep your appointment for colonoscopy.   4)  (update: i left message on phone-tree:  rx as we discussed)   Orders Added: 1)  Radiology Referral [Radiology] 2)  TLB-CBC Platelet - w/Differential [85025-CBCD] 3)  TLB-BMP (Basic Metabolic Panel-BMET) [80048-METABOL] 4)  TLB-Hepatic/Liver Function Pnl [80076-HEPATIC] 5)  TLB-Udip w/ Micro [81001-URINE] 6)  TLB-Amylase [82150-AMYL] 7)  Est. Patient Level IV [81191]

## 2011-01-25 NOTE — Miscellaneous (Signed)
Summary: [facility] Previsit/prep  Clinical Lists Changes  Medications: Added new medication of MOVIPREP 100 GM  SOLR (PEG-KCL-NACL-NASULF-NA ASC-C) As per prep instructions. - Signed Rx of MOVIPREP 100 GM  SOLR (PEG-KCL-NACL-NASULF-NA ASC-C) As per prep instructions.;  #1 x 0;  Signed;  Entered by: Wyona Almas RN;  Authorized by: Louis Meckel MD;  Method used: [facility]  [address], [place], [place]  [address], Ph: [phone] or [phone], Fax: [phone] Observations: Added new observation of ALLERGY REV: Done (12/26/2010 14:37) Added new observation of NKA: T (12/26/2010 14:37)    Prescriptions: MOVIPREP 100 GM  SOLR (PEG-KCL-NACL-NASULF-NA ASC-C) As per prep instructions.  #1 x 0   Entered by:   Wyona Almas RN   Authorized by:   Louis Meckel MD   Signed by:   Wyona Almas RN on 12/26/2010   Method used:   [facility]        [facility]  [address] (retail)       [address]       [place], [place]  [address]       Ph: [phone] or [phone]       Fax: [phone]   RxID:   [id number]

## 2011-01-25 NOTE — Progress Notes (Signed)
Summary: RT-side pain  Phone Note Call from Patient Call back at Home Phone [phone]   Caller: Patient Summary of Call: Pt called stating he is still having persistant, moderate RT-side pain. Pt says he was advised via PT mesage to call if pain persists. Initial call taken by: Margaret Pyle, CMA,  January 04, 2011 2:46 PM  Follow-up for Phone Call        i ordered ct Follow-up by: Minus Breeding MD,  January 04, 2011 2:54 PM  Additional Follow-up for Phone Call Additional follow up Details #1::        Pt informed and will expect a call from [facility] with appt info Additional Follow-up by: Margaret Pyle, CMA,  January 04, 2011 3:01 PM  New Problems: ABDOMINAL PAIN (ICD-789.00)   New Problems: ABDOMINAL PAIN (ICD-789.00)

## 2011-01-25 NOTE — Letter (Signed)
Summary: Diabetic Instructions  [facility]  [address]   [place], [place] [address]   Phone: [phone]  Fax: [phone]    Dylan Quinn 11/01/1936 MRN: [id number]    ________________________________________________________________________  x    INSULIN (LONG ACTING) MEDICATION INSTRUCTIONS (Lantus, NPH, 70/30, Humulin, Novolin-N)   The day before your procedure:   Take  your regular evening dose    The day of your procedure:   Do not take your morning dose     x    INSULIN (SHORT ACTING) MEDICATION INSTRUCTIONS (Regular, Humulog, Novolog)   The day before your procedure:   Do not take your evening dose   The day of your procedure:   Do not take your morning dose

## 2011-01-25 NOTE — Letter (Signed)
Summary: Moviprep Instructions  [facility]  [address]   [place], [place] [address]   Phone: [phone]  Fax: [phone]       Jerrett Pautsch    [birth year]    MRN: [id number]        Procedure Day Dorna Bloom: Tuesday, 01-09-11     Arrival Time: 7:30 a.m.     Procedure Time: 8:00 a.m.     Location of Procedure:                    x   [facility] (4th Floor)                        PREPARATION FOR COLONOSCOPY WITH MOVIPREP   Starting 5 days prior to your procedure 01-04-11 do not eat nuts, seeds, popcorn, corn, beans, peas,  salads, or any raw vegetables.  Do not take any fiber supplements (e.g. Metamucil, Citrucel, and Benefiber).  THE DAY BEFORE YOUR PROCEDURE         DATE: 01-08-11  DAY: Monday  1.  Drink clear liquids the entire day-NO SOLID FOOD  2.  Do not drink anything colored red or purple.  Avoid juices with pulp.  No orange juice.  3.  Drink at least 64 oz. (8 glasses) of fluid/clear liquids during the day to prevent dehydration and help the prep work efficiently.  CLEAR LIQUIDS INCLUDE: Water Jello Ice Popsicles Tea (sugar ok, no milk/cream) Powdered fruit flavored drinks Coffee (sugar ok, no milk/cream) Gatorade Juice: apple, white grape, white cranberry  Lemonade Clear bullion, consomm, broth Carbonated beverages (any kind) Strained chicken noodle soup Hard Candy                             4.  In the morning, mix first dose of MoviPrep solution:    Empty 1 Pouch A and 1 Pouch B into the disposable container    Add lukewarm drinking water to the top line of the container. Mix to dissolve    Refrigerate (mixed solution should be used within 24 hrs)  5.  Begin drinking the prep at 5:00 p.m. The MoviPrep container is divided by 4 marks.   Every 15 minutes drink the solution down to the next mark (approximately 8 oz) until the full liter is complete.   6.  Follow completed prep with 16 oz of clear liquid of your choice  (Nothing red or purple).  Continue to drink clear liquids until bedtime.  7.  Before going to bed, mix second dose of MoviPrep solution:    Empty 1 Pouch A and 1 Pouch B into the disposable container    Add lukewarm drinking water to the top line of the container. Mix to dissolve    Refrigerate  THE DAY OF YOUR PROCEDURE      DATE: 01-09-11  DAY: Tuesday  Beginning at 3:00 a.m. (5 hours before procedure):         1. Every 15 minutes, drink the solution down to the next mark (approx 8 oz) until the full liter is complete.  2. Follow completed prep with 16 oz. of clear liquid of your choice.    3. You may drink clear liquids until 6:00 a.m. (2 HOURS BEFORE PROCEDURE).   MEDICATION INSTRUCTIONS  Unless otherwise instructed, you should take regular prescription medications with a small sip of water   as early as possible the morning  of your procedure.  Diabetic patients - see separate instructions.         OTHER INSTRUCTIONS  You will need a responsible adult at least 73 years of age to accompany you and drive you home.   This person must remain in the waiting room during your procedure.  Wear loose fitting clothing that is easily removed.  Leave jewelry and other valuables at home.  However, you may wish to bring a book to read or  an iPod/MP3 player to listen to music as you wait for your procedure to start.  Remove all body piercing jewelry and leave at home.  Total time from sign-in until discharge is approximately 2-3 hours.  You should go home directly after your procedure and rest.  You can resume normal activities the  day after your procedure.  The day of your procedure you should not:   Drive   Make legal decisions   Operate machinery   Drink alcohol   Return to work  You will receive specific instructions about eating, activities and medications before you leave.    The above instructions have been reviewed and explained to me by  Wyona Almas  RN  December 26, 2010 3:18 PM     I fully understand and can verbalize these instructions _____________________________ Date _________

## 2011-01-25 NOTE — Procedures (Signed)
Summary: Colonoscopy/[facility]   Imported By: Sherian Rein 12/29/2010 09:44:13  _____________________________________________________________________  External Attachment:    Type:   Image     Comment:   External Document

## 2011-01-25 NOTE — Progress Notes (Signed)
Summary: pain/SAE pt  Phone Note Call from Patient Call back at Home Phone [phone]   Caller: Patient Summary of Call: Pt's spouse called stating pt is still having RT side pain. Spouse and pt are very concerned and are requesting MD advisement. Pt has recieved results of CT scan. Initial call taken by: Margaret Pyle, CMA,  January 15, 2011 4:09 PM  Follow-up for Phone Call        CT is essentially neg for cause for acute pain except for constipation;  OTC mag citrate x 1 by mouth would be ok to try  Besides that, I suppose pain could still be MSK (muscular) or even nerve related (neuritis) but very unlikely to be anything else given the CT  consider ROV wtih Dr Everardo All as needed for persistent pain if the OTC laxative does not help Follow-up by: Corwin Levins MD,  January 15, 2011 4:42 PM  Additional Follow-up for Phone Call Additional follow up Details #1::        Pt's spouse advised and will try OTC laxative Additional Follow-up by: Margaret Pyle, CMA,  January 16, 2011 11:27 AM

## 2011-05-24 ENCOUNTER — Ambulatory Visit
Admit: 2011-05-24 | Discharge: 2011-05-24 | Disposition: A | Discharge status: Home / Self Care | Payer: Self-pay | Source: Ambulatory Visit

## 2011-08-03 ENCOUNTER — Other Ambulatory Visit: Payer: Self-pay | Admitting: *Deleted

## 2011-08-03 MED ORDER — LOSARTAN POTASSIUM 50 MG PO TABS: 50.0000 mg | ORAL_TABLET | Freq: Every day | ORAL | Status: DC

## 2011-08-03 NOTE — Telephone Encounter (Signed)
R'cd fax from [facility] for refill of Losartan  Last OV-12/27/2010  Last filled-06/14/2010

## 2011-09-11 LAB — ECG 12-LEAD
Atrial Rate: 104 {beats}/min
P Axis: 61 degrees
P-R Interval: 158 ms
Q-T Interval: 332 ms
QRS Duration: 94 ms
QTC Calculation (Bezet): 436 ms
R Axis: 30 degrees
T Axis: 39 degrees
Ventricular Rate: 104 {beats}/min

## 2011-10-11 ENCOUNTER — Ambulatory Visit (INDEPENDENT_AMBULATORY_CARE_PROVIDER_SITE_OTHER): Payer: Medicare Other | Admitting: Endocrinology

## 2011-10-11 ENCOUNTER — Other Ambulatory Visit (INDEPENDENT_AMBULATORY_CARE_PROVIDER_SITE_OTHER): Payer: Medicare Other

## 2011-10-11 ENCOUNTER — Encounter: Payer: Self-pay | Admitting: Endocrinology

## 2011-10-11 VITALS — BP 144/82 | HR 65 | Temp 97.7°F | Ht 73.0 in | Wt 206.5 lb

## 2011-10-11 DIAGNOSIS — E109 Type 1 diabetes mellitus without complications: Secondary | ICD-10-CM

## 2011-10-11 DIAGNOSIS — H9190 Unspecified hearing loss, unspecified ear: Secondary | ICD-10-CM

## 2011-10-11 LAB — BASIC METABOLIC PANEL
BUN: 17 mg/dL (ref 6–23)
CO2: 29 mEq/L (ref 19–32)
Chloride: 104 mEq/L (ref 96–112)
Creatinine, Ser: 0.9 mg/dL (ref 0.4–1.5)

## 2011-10-11 MED ORDER — LISINOPRIL 20 MG PO TABS: 20.0000 mg | ORAL_TABLET | Freq: Every day | ORAL | Status: DC

## 2011-10-11 MED ORDER — CLOBETASOL PROPIONATE 0.05 % EX CREA: TOPICAL_CREAM | Freq: Three times a day (TID) | CUTANEOUS | Status: DC | PRN

## 2011-10-11 NOTE — Patient Instructions (Addendum)
reduce lantus to 22 units once daily. increase humalog to 9 units three times a day.  However, if you are going to be active, take only 6 units that shot.   check your blood sugar 2 times a day.  vary the time of day when you check, between before the 3 meals, and at bedtime.  also check if you have symptoms of your blood sugar being too high or too low.  please keep a record of the readings and bring it to your next appointment here.  please call us sooner if you are having low blood sugar episodes, or if it stays over 200. good diet and exercise habits significanly improve the control of your diabetes.  please let me know if you wish to be referred to a dietician.  high blood sugar is very risky to your health.  you should see an eye doctor every year. controlling your blood pressure and cholesterol drastically reduces the damage diabetes does to your body.  this also applies to quitting smoking.  please discuss these with your doctor.  you should take an aspirin every day, unless you have been advised by a doctor not to. blood tests are being requested for you today.  please call [phone] to hear your test results.  You will be prompted to enter the 9-digit "MRN" number that appears at the top left of this page, followed by #.  Then you will hear the message. Increase lisinopril to 20 mg daily.   Stop the cozaar. Please come back for a follow-up appointment in 1 month.  Please make an appointment.   Refer to a hearing specialist.  you will receive a phone call, about a day and time for an appointment. (update: i left message on phone-tree:  Take humalog to 10 units tid (qac).  Take just 6 if exertion is anticipated)

## 2011-10-11 NOTE — Progress Notes (Signed)
Subjective:    Patient ID: Dylan Quinn, male    DOB: 1936-02-02, 75 y.o.   MRN: [id number]  HPI Pt has been living in [place], where he say nephrologist.  He was started on zestril. no cbg record, but states cbg's vary from 80-300.  It is lowest with activity, and highest after eating.  It is higher at hs, than in the am.  Wife says pt has few mos of moderate hearing loss from both ears, but no assoc pain Past Medical History  Diagnosis Date  . BENIGN PROSTATIC HYPERTROPHY 07/19/2009  . COLONIC POLYPS 02/14/2010  . DEPRESSION 03/16/2008  . DIABETES MELLITUS, TYPE I 06/30/2007  . FOLLICULITIS 07/28/2008  . GLAUCOMA 07/19/2009  . HEARING LOSS 02/14/2010  . HYPERTENSION 05/14/2008  . HYPOGONADISM, MALE 12/16/2007  . LUMBAR RADICULOPATHY, LEFT 02/23/2010  . Other osteoporosis 12/16/2007  . PERIPHERAL NEUROPATHY 06/30/2007  . PROTEINURIA, MILD 02/14/2010  . URINARY CALCULUS 12/27/2010  . Dyslipidemia   . Leukopenia   . Psoriasis     Past Surgical History  Procedure Date  . Cataract extraction     History   Social History  . Marital Status: Married    Spouse Name: N/A    Number of Children: 2  . Years of Education: N/A   Occupational History  . Retired    Social History Main Topics  . Smoking status: Never Smoker   . Smokeless tobacco: Not on file  . Alcohol Use: Yes     rare  . Drug Use: No  . Sexually Active:    Other Topics Concern  . 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 aspirin 81 MG tablet Take 81 mg by mouth daily.        Marland Kitchen glucose blood (ONE TOUCH ULTRA TEST) test strip Use as instructed six times a day, dx 250.01, variable glucoses, insulin pump       . HYDROcodone-acetaminophen (VICODIN) 5-500 MG per tablet Take 1 tablet by mouth every 6 (six) hours as needed. For pain       . insulin glargine (LANTUS SOLOSTAR) 100 UNIT/ML  injection Inject 22 Units into the skin every morning.       . insulin lispro (HUMALOG KWIKPEN) 100 UNIT/ML injection Inject 9 Units into the skin 3 (three) times daily before meals.       . Insulin Pen Needle (BD PEN NEEDLE NANO U/F) 32G X 4 MM MISC Use as directed dx 250.01         No Known Allergies  Family History  Problem Relation Age of Onset  . Cancer Father     Colon Cancer    BP 144/82  Pulse 65  Temp(Src) 97.7 F (36.5 C) (Oral)  Ht 6\' 1"  (1.854 m)  Wt 206 lb 8 oz (93.668 kg)  BMI 27.24 kg/m2  SpO2 97%  Review of Systems denies hypoglycemia.  He says he has lost weight, due to his efforts.      Objective:   Physical Exam VITAL SIGNS:  See vs page GENERAL: no distress Both eac's and tm's are normal Pulses: dorsalis pedis intact bilat.   Feet: no deformity.  no ulcer on the feet.  feet are of normal color and temp.  no edema Neuro: sensation is intact to touch on the feet.     outside test results are  reviewed: A1c=8.8%    Assessment & Plan:  Dm, needs increased rx Htn, needs increased rx Dm nephropathy.  The safety of acei and arb together has been called into question.   Hearing loss, persistent

## 2011-10-12 NOTE — Discharge Summary (Signed)
Roy Delgado, Roy Delgado      MRN:          [id number]      Account:      [id number]      Document ID:  [id number]                  Admit Date: 07/25/2010      Discharge Date: 07/28/2010            ATTENDING PHYSICIAN:  Lucilla Edin, MD                  ADMITTING DIAGNOSIS:      Diabetic ketoacidosis.            OTHER DIAGNOSES:      Staphylococcus capitis, Staphylococcus lugdunensis bacteremia, acute kidney      injury, hypertension, glaucoma.            HISTORY OF PRESENT ILLNESS:      This is 75 year old male with past medical history significant for type 2      diabetes and left leg cellulitis, hypertension, and glaucoma who presented      to the ER after having a high-calorie dinner and administering insulin that      failed to bring his blood sugar down to normal.  He also had nausea and      vomiting and a short episode of chills without any fever, felt very thirsty      and weak, and came to the ER to be further evaluated.  In the ER, his blood      sugar was found to be 600, and he was started on IV fluids and an insulin      drip.            PAST MEDICAL HISTORY:      Diabetes 2 managed with an insulin pump, left leg cellulitis, hypertension,      glaucoma.            SOCIAL HISTORY:      Does not follow strict diabetic diet, has a history of noncompliance.  No      smoking, no alcohol, no IV drug use.            ALLERGIES:      No known drug allergies.            MEDICATIONS AT HOME:      Cozaar 50 mg p.o. daily, Alphagan and Combigan eye drops for his glaucoma,      and an insulin pump.            HOSPITAL COURSE:      1.  DKA.  The patient was admitted to the ICU and started on fluids,      insulin drip.  His electrolytes were repleted aggressively.  He was      transitioned to 30 units of Lantus and Aspart 4 q.a.c.  He was transitioned      to a diabetic diet.  His Accu-Cheks on discharge were:  174 was his last      blood glucose.  Also in the ICU, his gap closed.  His hemoglobin A1c on      admission was  12%.  He was evaluated by a diabetic teaching instructor,                                   Page 1 of 2  Roy Delgado, Roy Delgado      MRN:          [id number]      Account:      [id number]      Document ID:  [id number]                  nutrition consult for further help with his diabetic teaching.      2.  Staphylococcus capitus and Staphylococcus lugdunensis bacteremia, this      gram-positive bacteremia, repeat cultures were negative, so these likely      were from skin flora, likely contaminant.  We did place him on vancomycin      for 2 days with the initial cultures growing out Gram positive cocci, but      once this speciation came back for skin flora, we felt it was likely      secondary to a contaminant, and with repeat cultures being negative, we      discontinued the vancomycin.      3.  Gram-positive bacteriuria, 10,000 to 25,000 mixed flora, also possible      contamination.  Unlikely UTI since he is asymptomatic, no dysuria,      hematuria, urgency, so also contaminant at this point.      4.  Acute kidney injury.  His creatinine improved from 1.5 to 0.9 after he      was fluid resuscitated with the DKA.  We repleted his electrolytes, and his      I's and O's were monitored.  He had good urine output.      5.  Hypertension.  Blood pressures were high.  We increased his home dose      of losartan from 50 mg daily to 75 mg daily.      6.  Glaucoma.  We continued his home latanoprost, timolol, and brimonidine      drops.  No issues while in-house.            MEDICATIONS ON DISCHARGE:      Cozaar 75 mg p.o. daily, Lantus 29 units q. a.m., Humalog 4 to 10 units      three times a day with meals as directed, Alphagan 0.15% one drop in each      eye twice a day, Xalatan 0.005% one drop in each eye at night, Timoptic      0.5% one drop in each eye twice a day.            FOLLOWUP:      He had an appointment with Dr. _____, Monday, July 31, 2010, at 4:00 p.m.      He also had an appointment with his primary  care, Dr. _____, Wednesday,      August 02, 2010.                        Electronic Signing Provider            D:  08/03/2010 21:42 PM by Dr. Maurie Boettcher, MD ([id number])      T:  08/04/2010 07:18 AM by [email or web address]                        cc:                                   Page 2 of 2  Authenticated by Maurie Boettcher, MD On 08/07/2010 03:32:35 PM      Authenticated by Lucilla Edin, MD On 08/08/2010 09:26:55 PM

## 2011-10-12 NOTE — H&P (Signed)
[name], [name]      MRN:          [id number]      Account:      [id number]      Document ID:  [id number]                  Admit Date: 07/25/2010            Patient Location: [facility]      Patient Type: I            ATTENDING PHYSICIAN: Leonette Monarch, MD                  HISTORY OF PRESENT ILLNESS:      This is a 75 year old gentleman with a past medical history of type 2      diabetes for about 15 years who failed p.o. antidiabetics, currently      managed with insulin pump, for noncompliance to his diet and no exercise      regimen.  Also, high blood pressure and glaucoma.  No history of coronary      artery disease, chronic renal disease or CVAs.  He a had high calorie      dinner 48 hours ago.  He says his blood sugar did not return toward his      regular baseline which is hovering around 200.  He self-administered      supplemental insulin 15 units but that failed to bring down his blood      sugar.  Last night he started having nausea and vomiting.  He also had a      short episode of chills but no fever was documented.  He started feeling      more thirsty and generally weak.  He came to the ER where he was found to      have a blood sugar above 600.  He was started on IV fluids and insulin      drip.  The patient now complains of thirst.  He feels sleepy and generally      weak but overall he states that he feels better than a few hours ago.  He      denies headaches or visual changes.  His nausea is better.  He states he      never vomited.  No chest pain, no shortness of breath, no cough nor wheezes      and no sputum production.  No abdominal pain, no burning on urination.  He      had one episode of a loose bowel movement but no consistent diarrhea.  No      skin rashes.  No lower extremity sweating and no signs of recent skin      infections.            REVIEW OF SYSTEMS:      Performed in detail and positive as above.            PAST MEDICAL HISTORY:      As above, plus history of left leg  cellulitis in January of 2011 treated      with a short course of Rocephin.            ALLERGIES:      None.            MEDICATIONS AT HOME:      Cozaar 50 mg daily, Alphagan and Combigan eye drops for his glaucoma.  SOCIAL HISTORY:      He lives at home with wife.  He does not follow a strict diabetic diet.  He                                   Page 1 of 3      Roy Delgado, Roy Delgado      MRN:          [id number]      Account:      [id number]      Document ID:  [id number]                  does not smoke or drink alcohol.  No history of IV drug use.            FAMILY HISTORY:      Noncontributory.            PHYSICAL EXAMINATION:      GENERAL:  The patient in no acute distress, lethargic.      VITAL SIGNS:  133/78, heart rate 84, oxygen saturation 97% on 2 liters      nasal cannula, respiratory rate 19, temperature is not available at this      time.  No I's and O's available.      NEUROLOGIC:  The patient is lethargic, easily arousable, answers      appropriate all questions.  Able to speak in full sentences.  Moves all      extremities antigravity.      HEAD AND NECK:  No JVD, no carotid bruits.      CARDIOVASCULAR:  Regular S1 and S2.      LUNGS:  Clear to auscultation.      ABDOMEN:  Soft, nontender, mildly distended, decreased bowel sounds.      EXTREMITIES:  No edema, clubbing or cyanosis.  Calves are nontender.      SKIN:  No obvious rashes.            LABORATORY DATA:      White count 17.2, hemoglobin 12.2, hematocrit 38.4, platelets 160, sodium      142, potassium 5.2, chloride 106, bicarbonate 9, BUN 28, creatinine 1.5,      glucose 618 and calcium 8.6.  LFTs within normal limits except alkaline      phosphatase was elevated at 174.  Lactic acid is 1.9.  UA is positive for      protein and glucose above 1000, positive for ketones, negative for      leukocyte esterase and 0 to 5 red blood cells.  ABG:  pH 7.14, pCO2 of 19,      pO2 105, bicarbonate 6.6 and oxygen saturation 96% on room air.  Chest       x-ray shows no active infiltrate.  EKG shows regular sinus rhythm,      tachycardia, normal QRS, no ST elevations.            IMPRESSION AND PLAN:      1.  DKA in the setting of poorly controlled type 2 diabetes.  Most likely      cause of this DKA is noncompliance with diet.  It is likely that his      insulin pump is malfunctioning and there is no sign of obvious infection at      this time.  For his DKA management he got 4 liters of normal saline and is  currently getting normal saline at 200 cc an hour.  We are going to change      his IV fluids to a bicarbonate drip with 3 amps of sodium bicarbonate for      each liter of sterile water at the same rate of 200 an hour.  We will      followup blood sugar hourly.  For now his insulin drip rate should be      increased to 12 units an hour.  We are going to followup BMP, magnesium and      phosphatase every 6 hours and replete accordingly.  If the blood sugar goes      below 250 D5 will be added to the IV fluids.  We are going to followup on      ABG in a few hours to monitor his severe metabolic acidosis after insulin,      IV fluids and bicarbonate drip.  The patient will be admitted to the                                   Page 2 of 3      Roy Delgado, Roy Delgado      MRN:          [id number]      Account:      [id number]      Document ID:  [id number]                  medical ICU for further management and monitoring.      2.  Cardiovascular:  Again, continue his Cozaar for high blood pressure and      IV fluids as above.  We are going to followup cardiac enzymes and there are      no signs of acute ischemic changes on the EKG and cardiac enzymes are      negative.      3.  Diabetes mellitus.  Insulin drip for now and insulin pump is off right      now.  We are going to _____ diabetes medication and will check H A1C level.      4.  Pulmonary:  There is no sign of active pulmonary infection.  Oxygen      p.r.n.      5.  GI:  keep him NPO and when he closes his  anion gap and resolves his      severe metabolic acidosis he can be started on a p.o. diet and prophylaxis.      6.  Renal and electrolytes:  The patient has severe metabolic acidosis as      above.  We will start a bicarbonate drip and followup ABG 4 hours later.      We are going to followup electrolytes every 6 hours to followup closely the      anion gap.  His lactate level is normal.  His creatinine is mildly elevated      which might be a result of his chronic renal insufficiency in the setting      of longstanding and poorly controlled diabetes.  It might be a component of      acute kidney injury due to his DKA and intravascular depletion.  Followup      magnesium and phosphorus and replete accordingly.  The patient is still      hyperkalemic so we are going to potassium to  repletion fluids.      7.  Heme:  Increased white blood count.  Unclear if it is in the setting of      SIRS or if it is an actual infection.  We are going to send a sepsis      workup, blood cultures x2 and urine culture.  H and H are stable.  We will      followup.      8.  DVT prophylaxis:  SCDs and low molecular weight heparin.            Critical care time spent with this patient was 68 minutes excluding      procedures.                        Electronic Signing Provider            D:  07/25/2010 13:01 PM by Dr. Doristine [place] ([id number])      T:  07/25/2010 13:26 PM by Justice Deeds                  cc:                                   Page 3 of 3      Authenticated by Baruch Gouty, MD On 07/30/2010 08:33:22 PM

## 2011-10-26 ENCOUNTER — Encounter: Payer: Self-pay | Admitting: Endocrinology

## 2011-11-20 ENCOUNTER — Encounter: Payer: Self-pay | Admitting: Endocrinology

## 2011-11-20 ENCOUNTER — Ambulatory Visit (INDEPENDENT_AMBULATORY_CARE_PROVIDER_SITE_OTHER): Payer: Medicare Other | Admitting: Endocrinology

## 2011-11-20 DIAGNOSIS — E109 Type 1 diabetes mellitus without complications: Secondary | ICD-10-CM

## 2011-11-20 NOTE — Progress Notes (Signed)
Subjective:    Patient ID: Dylan Quinn, male    DOB: [birth year], 75 y.o.   MRN: [id number]  HPI The state of at least three ongoing medical problems is addressed today: Pt returns for f/u of type 1 dm (1997).  no cbg record, but states cbg was low once in the afternoon, after an unexpectedly small meal.  However, it is most likely to be low in teh middle of the night.  He says he has to eat at hs, to avoid nighttime hypoglycemia.  He has lost a few lbs, due to his efforts.  He says cbg is in general higher as the day goes on. His ed has recurred. HTN: no weight change Past Medical History  Diagnosis Date  . BENIGN PROSTATIC HYPERTROPHY 07/19/2009  . COLONIC POLYPS 02/14/2010  . DEPRESSION 03/16/2008  . DIABETES MELLITUS, TYPE I 06/30/2007  . FOLLICULITIS 07/28/2008  . GLAUCOMA 07/19/2009  . HEARING LOSS 02/14/2010  . HYPERTENSION 05/14/2008  . HYPOGONADISM, MALE 12/16/2007  . LUMBAR RADICULOPATHY, LEFT 02/23/2010  . Other osteoporosis 12/16/2007  . PERIPHERAL NEUROPATHY 06/30/2007  . PROTEINURIA, MILD 02/14/2010  . URINARY CALCULUS 12/27/2010  . Dyslipidemia   . Leukopenia   . Psoriasis     Past Surgical History  Procedure Date  . Cataract extraction     History   Social History  . Marital Status: Married    Spouse Name: N/A    Number of Children: 2  . Years of Education: N/A   Occupational History  . Retired    Social History Main Topics  . Smoking status: Never Smoker   . Smokeless tobacco: Not on file  . Alcohol Use: Yes     rare  . Drug Use: No  . Sexually Active:    Other Topics Concern  . 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 aspirin 81 MG tablet Take 81 mg by mouth daily.        . clobetasol (TEMOVATE) 0.05 % cream Apply topically 3 (three) times daily as needed.  30 g  1  . glucose blood (ONE TOUCH ULTRA TEST) test strip Use as  instructed six times a day, dx 250.01, variable glucoses, insulin pump       . HYDROcodone-acetaminophen (VICODIN) 5-500 MG per tablet Take 1 tablet by mouth every 6 (six) hours as needed. For pain       . insulin glargine (LANTUS SOLOSTAR) 100 UNIT/ML injection Inject 20 Units into the skin every morning.       . insulin lispro (HUMALOG KWIKPEN) 100 UNIT/ML injection Inject 11 Units into the skin 3 (three) times daily before meals.       . Insulin Pen Needle (BD PEN NEEDLE NANO U/F) 32G X 4 MM MISC Use as directed dx 250.01       . lisinopril (PRINIVIL,ZESTRIL) 20 MG tablet Take 1 tablet (20 mg total) by mouth daily.  30 tablet  11    No Known Allergies  Family History  Problem Relation Age of Onset  . Cancer Father     Colon Cancer   BP 122/72  Pulse 63  Temp(Src) 98.1 F (36.7 C) (Oral)  Ht 6\' 1"  (1.854 m)  Wt 210 lb (95.255 kg)  BMI 27.71 kg/m2  SpO2 97%  Review of Systems Denies loc. he has headache with cialis  Objective:   Physical Exam VITAL SIGNS:  See vs page GENERAL: no distress SKIN:  Insulin injection sites at the anterior abdomen are normal     Assessment & Plan:  DM:  therapy limited by noncompliance.  i'll do the best i can.  He declines to see a dietician.   ED, recurrent HTN, well-controlled.

## 2011-11-20 NOTE — Patient Instructions (Addendum)
reduce lantus to 20 units once daily. increase humalog to 11 units three times a day.  However, if you are going to be active, take only 6 units that shot.   check your blood sugar 6 times a day.  vary the time of day when you check, between before the 3 meals, and at bedtime.  also check if you have symptoms of your blood sugar being too high or too low.  please keep a record of the readings and bring it to your next appointment here.  please call us sooner if you are having low blood sugar episodes, or if it stays over 200.  It i very important to write it down.  Here are 2 books to write it in.  For the best results, you need to eat 3 meals per day.   Please come back for a follow-up appointment in January.   Try these samples of "stayaxin," for ED symptoms.

## 2012-03-06 ENCOUNTER — Ambulatory Visit (INDEPENDENT_AMBULATORY_CARE_PROVIDER_SITE_OTHER): Payer: Medicare Other | Admitting: Endocrinology

## 2012-03-06 ENCOUNTER — Encounter: Payer: Self-pay | Admitting: Endocrinology

## 2012-03-06 ENCOUNTER — Other Ambulatory Visit (INDEPENDENT_AMBULATORY_CARE_PROVIDER_SITE_OTHER): Payer: Medicare Other

## 2012-03-06 VITALS — BP 134/82 | HR 66 | Temp 97.5°F | Ht 71.0 in | Wt 207.0 lb

## 2012-03-06 DIAGNOSIS — E109 Type 1 diabetes mellitus without complications: Secondary | ICD-10-CM

## 2012-03-06 DIAGNOSIS — E291 Testicular hypofunction: Secondary | ICD-10-CM

## 2012-03-06 LAB — HEMOGLOBIN A1C: Hgb A1c MFr Bld: 10.6 % — ABNORMAL HIGH (ref 4.6–6.5)

## 2012-03-06 MED ORDER — CLOMIPHENE CITRATE 50 MG PO TABS: ORAL_TABLET | ORAL | Status: DC

## 2012-03-06 NOTE — Patient Instructions (Addendum)
reduce lantus to 20 units once daily. increase humalog to 11 units three times a day.  However, if you are going to be active, take only 6 units that shot.   check your blood sugar 6 times a day.  vary the time of day when you check, between before the 3 meals, and at bedtime.  also check if you have symptoms of your blood sugar being too high or too low.  please keep a record of the readings and bring it to your next appointment here.  please call us sooner if you are having low blood sugar episodes, or if it stays over 200.  It i very important to write it down.  Here are 2 books to write it in.  For the best results, you need to eat 3 meals per day.   blood tests are being requested for you today.  You will receive a letter with results.   Resume clomiphine 1/4 tab daily.  i have sent a prescription to your pharmacy.

## 2012-03-06 NOTE — Progress Notes (Signed)
Subjective:    Patient ID: Dylan Quinn, male    DOB: July 25, 1936, 76 y.o.   MRN: [id number]  HPI The state of at least three ongoing medical problems is addressed today: Pt returns for f/u of type 1 dm (1997).  no cbg record, but states cbg was low in the middle of the night.  He says he has to eat at hs, to avoid nighttime hypoglycemia.  He has lost a few lbs, due to his efforts.  He says cbg is in general higher as the day goes on.  He still takes lantus 24 units qhs, and humalog 10 units tid (qac). Hypogonadism: decreased libido has recurred recently.  He is not taking his clomid. ED: styaxin did not help Past Medical History  Diagnosis Date  . BENIGN PROSTATIC HYPERTROPHY 07/19/2009  . COLONIC POLYPS 02/14/2010  . DEPRESSION 03/16/2008  . DIABETES MELLITUS, TYPE I 06/30/2007  . FOLLICULITIS 07/28/2008  . GLAUCOMA 07/19/2009  . HEARING LOSS 02/14/2010  . HYPERTENSION 05/14/2008  . HYPOGONADISM, MALE 12/16/2007  . LUMBAR RADICULOPATHY, LEFT 02/23/2010  . Other osteoporosis 12/16/2007  . PERIPHERAL NEUROPATHY 06/30/2007  . PROTEINURIA, MILD 02/14/2010  . URINARY CALCULUS 12/27/2010  . Dyslipidemia   . Leukopenia   . Psoriasis     Past Surgical History  Procedure Date  . Cataract extraction     History   Social History  . Marital Status: Married    Spouse Name: N/A    Number of Children: 2  . Years of Education: N/A   Occupational History  . Retired    Social History Main Topics  . Smoking status: Never Smoker   . Smokeless tobacco: Not on file  . Alcohol Use: Yes     rare  . Drug Use: No  . Sexually Active:    Other Topics Concern  . 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 aspirin 81 MG tablet Take 81 mg by mouth daily.        . clobetasol (TEMOVATE) 0.05 % cream Apply topically 3 (three) times daily as needed.  30 g  1  . glucose blood  (ONE TOUCH ULTRA TEST) test strip Use as instructed six times a day, dx 250.01, variable glucoses, insulin pump       . HYDROcodone-acetaminophen (VICODIN) 5-500 MG per tablet Take 1 tablet by mouth every 6 (six) hours as needed. For pain       . insulin glargine (LANTUS SOLOSTAR) 100 UNIT/ML injection Inject 20 Units into the skin every morning.       . insulin lispro (HUMALOG KWIKPEN) 100 UNIT/ML injection Inject 11 Units into the skin 3 (three) times daily before meals.       . Insulin Pen Needle (BD PEN NEEDLE NANO U/F) 32G X 4 MM MISC Use as directed dx 250.01       . lisinopril (PRINIVIL,ZESTRIL) 20 MG tablet Take 1 tablet (20 mg total) by mouth daily.  30 tablet  11    No Known Allergies  Family History  Problem Relation Age of Onset  . Cancer Father     Colon Cancer    BP 134/82  Pulse 66  Temp(Src) 97.5 F (36.4 C) (Oral)  Ht 5\' 11"  (1.803 m)  Wt 207 lb (93.895 kg)  BMI 28.87 kg/m2  SpO2 96%   Review of Systems  Denies loc and decreased urinary stream    Objective:   Physical Exam VITAL SIGNS:  See vs page GENERAL: no distress GENITALIA:  Normal male testicles, scrotum, and penis    Lab Results  Component Value Date   HGBA1C 10.6* 03/06/2012      Assessment & Plan:  Hypogonadism, not on rx DM, therapy limited by noncompliance with cbg recording.  i'll do the best i can. ED, needs increased rx.  Improving testosterone level may help

## 2012-05-21 ENCOUNTER — Other Ambulatory Visit: Payer: Self-pay | Admitting: Internal Medicine

## 2012-05-21 DIAGNOSIS — M545 Low back pain, unspecified: Secondary | ICD-10-CM

## 2012-05-22 ENCOUNTER — Ambulatory Visit: Payer: Medicare Other | Attending: Internal Medicine

## 2012-05-22 ENCOUNTER — Other Ambulatory Visit: Payer: Self-pay | Admitting: Internal Medicine

## 2012-05-22 DIAGNOSIS — M51379 Other intervertebral disc degeneration, lumbosacral region without mention of lumbar back pain or lower extremity pain: Secondary | ICD-10-CM | POA: Insufficient documentation

## 2012-05-22 DIAGNOSIS — M47817 Spondylosis without myelopathy or radiculopathy, lumbosacral region: Secondary | ICD-10-CM | POA: Insufficient documentation

## 2012-05-22 DIAGNOSIS — M79609 Pain in unspecified limb: Secondary | ICD-10-CM | POA: Insufficient documentation

## 2012-05-22 DIAGNOSIS — M5137 Other intervertebral disc degeneration, lumbosacral region: Secondary | ICD-10-CM | POA: Insufficient documentation

## 2012-05-22 DIAGNOSIS — M545 Low back pain, unspecified: Secondary | ICD-10-CM

## 2012-05-22 DIAGNOSIS — M4310 Spondylolisthesis, site unspecified: Secondary | ICD-10-CM | POA: Insufficient documentation

## 2012-05-22 DIAGNOSIS — M5126 Other intervertebral disc displacement, lumbar region: Secondary | ICD-10-CM | POA: Insufficient documentation

## 2012-07-17 ENCOUNTER — Other Ambulatory Visit: Payer: Self-pay | Admitting: Endocrinology

## 2012-07-28 ENCOUNTER — Ambulatory Visit (INDEPENDENT_AMBULATORY_CARE_PROVIDER_SITE_OTHER): Payer: Medicare Other | Admitting: Endocrinology

## 2012-07-28 ENCOUNTER — Encounter: Payer: Self-pay | Admitting: Endocrinology

## 2012-07-28 VITALS — BP 130/82 | HR 77 | Temp 97.4°F | Wt 204.0 lb

## 2012-07-28 DIAGNOSIS — R21 Rash and other nonspecific skin eruption: Secondary | ICD-10-CM

## 2012-07-28 DIAGNOSIS — G4700 Insomnia, unspecified: Secondary | ICD-10-CM | POA: Insufficient documentation

## 2012-07-28 DIAGNOSIS — E109 Type 1 diabetes mellitus without complications: Secondary | ICD-10-CM

## 2012-07-28 MED ORDER — INSULIN PEN NEEDLE 32G X 4 MM MISC: Status: DC

## 2012-07-28 MED ORDER — HALOBETASOL PROPIONATE 0.05 % EX CREA: TOPICAL_CREAM | Freq: Three times a day (TID) | CUTANEOUS | Status: DC | PRN

## 2012-07-28 MED ORDER — INSULIN LISPRO 100 UNIT/ML ~~LOC~~ SOLN: 11.0000 [IU] | Freq: Three times a day (TID) | SUBCUTANEOUS | Status: DC

## 2012-07-28 MED ORDER — INSULIN GLARGINE 100 UNIT/ML ~~LOC~~ SOLN: 55.0000 [IU] | Freq: Every day | SUBCUTANEOUS | Status: DC

## 2012-07-28 MED ORDER — SILDENAFIL CITRATE 100 MG PO TABS: 100.0000 mg | ORAL_TABLET | Freq: Every day | ORAL | Status: DC | PRN

## 2012-07-28 MED ORDER — ZOLPIDEM TARTRATE 5 MG PO TABS: 5.0000 mg | ORAL_TABLET | Freq: Every evening | ORAL | Status: DC | PRN

## 2012-07-28 NOTE — Progress Notes (Signed)
Subjective:    Patient ID: Dylan Quinn, male    DOB: 09-19-1936, 76 y.o.   MRN: [id number]  HPI Pt states few days of moderate rash on the left arm, and assoc itching.   Right leg radicular sxs are improved.  He takes lyrica.   Pt returns for f/u of type 1 dm (dx'ed 1997; complicated by retinopathy, peripheral sensory neuropathy, and nephropathy). no cbg record, but states cbg's are highest at the evening meal, and lowest in am.  Wife says pt is noncompliant with insulin and diet.   Past Medical History  Diagnosis Date  . BENIGN PROSTATIC HYPERTROPHY 07/19/2009  . COLONIC POLYPS 02/14/2010  . DEPRESSION 03/16/2008  . DIABETES MELLITUS, TYPE I 06/30/2007  . FOLLICULITIS 07/28/2008  . GLAUCOMA 07/19/2009  . HEARING LOSS 02/14/2010  . HYPERTENSION 05/14/2008  . HYPOGONADISM, MALE 12/16/2007  . LUMBAR RADICULOPATHY, LEFT 02/23/2010  . Other osteoporosis 12/16/2007  . PERIPHERAL NEUROPATHY 06/30/2007  . PROTEINURIA, MILD 02/14/2010  . URINARY CALCULUS 12/27/2010  . Dyslipidemia   . Leukopenia   . Psoriasis     Past Surgical History  Procedure Date  . Cataract extraction     History   Social History  . Marital Status: Married    Spouse Name: N/A    Number of Children: 2  . Years of Education: N/A   Occupational History  . Retired    Social History Main Topics  . Smoking status: Never Smoker   . Smokeless tobacco: Not on file  . Alcohol Use: Yes     rare  . Drug Use: No  . Sexually Active:    Other Topics Concern  . 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 aspirin 81 MG tablet Take 81 mg by mouth daily.        . clobetasol (TEMOVATE) 0.05 % cream Apply topically 3 (three) times daily as needed.  30 g  1  . glucose blood (ONE TOUCH ULTRA TEST) test strip Use as instructed six times a day, dx 250.01, variable glucoses, insulin pump       .  lisinopril (PRINIVIL,ZESTRIL) 20 MG tablet Take 1 tablet (20 mg total) by mouth daily.  30 tablet  11  . pregabalin (LYRICA) 100 MG capsule Take 100 mg by mouth 2 (two) times daily.      . clomiPHENE (CLOMID) 50 MG tablet 1/4 tab daily  10 tablet  11  . sildenafil (VIAGRA) 100 MG tablet Take 1 tablet (100 mg total) by mouth daily as needed for erectile dysfunction.  30 tablet  11  . zolpidem (AMBIEN) 5 MG tablet Take 1 tablet (5 mg total) by mouth at bedtime as needed for sleep.  30 tablet  2   No Known Allergies  Family History  Problem Relation Age of Onset  . Cancer Father     Colon Cancer   BP 130/82  Pulse 77  Temp 97.4 F (36.3 C) (Oral)  Wt 204 lb (92.534 kg)  SpO2 98%  Review of Systems Insomnia persists.  denies hypoglycemia    Objective:   Physical Exam VITAL SIGNS:  See vs page GENERAL: no distress. Left forearm and wrist:  Moderate eczematous rash.    Pt says a1c was recently 10.   (i reviewed MRI report from [place])    Assessment & Plan:  Rash, new,  uncertain etiology Insomnia, new DM, poor control.  He should try a simpler regimen Radicular sxs, improved on lyrica

## 2012-07-28 NOTE — Patient Instructions (Addendum)
i have sent a prescription to your pharmacy, for an anti-itch skin cream.  Here is a prescription for "ambien."   increase lantus to 55 units once daily.  stop humalog.   check your blood sugar 6 times a day.  vary the time of day when you check, between before the 3 meals, and at bedtime.  also check if you have symptoms of your blood sugar being too high or too low.  please keep a record of the readings and bring it to your next appointment here.  please call us sooner if you are having low blood sugar episodes, or if it stays over 200.  It i very important to write it down.  Here are 2 books to write it in.  For the best results, you need to eat 3 meals per day.   Please come back for a follow-up appointment for 1 month.

## 2012-08-10 ENCOUNTER — Emergency Department: Payer: Self-pay | Admitting: Emergency Medicine

## 2012-08-10 LAB — CBC WITH DIFFERENTIAL/PLATELET
Basophil %: 0.3 %
Eosinophil #: 0.5 10*3/uL (ref 0.0–0.7)
Eosinophil %: 9 %
HCT: 40.7 % (ref 40.0–52.0)
HGB: 13.2 g/dL (ref 13.0–18.0)
Lymphocyte #: 0.8 10*3/uL — ABNORMAL LOW (ref 1.0–3.6)
Lymphocyte %: 14.9 %
MCH: 30 pg (ref 26.0–34.0)
MCHC: 32.5 g/dL (ref 32.0–36.0)
MCV: 92 fL (ref 80–100)
Monocyte #: 0.4 x10 3/mm (ref 0.2–1.0)
Monocyte %: 8 %
Neutrophil #: 3.8 10*3/uL (ref 1.4–6.5)
Neutrophil %: 67.8 %
Platelet: 197 10*3/uL (ref 150–440)

## 2012-08-10 LAB — BASIC METABOLIC PANEL
BUN: 19 mg/dL — ABNORMAL HIGH (ref 7–18)
Calcium, Total: 8.6 mg/dL (ref 8.5–10.1)
Chloride: 108 mmol/L — ABNORMAL HIGH (ref 98–107)
Co2: 25 mmol/L (ref 21–32)
EGFR (Non-African Amer.): >60
Glucose: 289 mg/dL — ABNORMAL HIGH (ref 65–99)
Potassium: 4.2 mmol/L (ref 3.5–5.1)
Sodium: 139 mmol/L (ref 136–145)

## 2012-10-07 ENCOUNTER — Other Ambulatory Visit: Payer: Self-pay | Admitting: Endocrinology

## 2013-01-13 ENCOUNTER — Ambulatory Visit (INDEPENDENT_AMBULATORY_CARE_PROVIDER_SITE_OTHER): Payer: Medicare Other | Admitting: Endocrinology

## 2013-01-13 VITALS — BP 134/72 | HR 67 | Wt 220.0 lb

## 2013-01-13 DIAGNOSIS — N209 Urinary calculus, unspecified: Secondary | ICD-10-CM

## 2013-01-13 DIAGNOSIS — Z0000 Encounter for general adult medical examination without abnormal findings: Secondary | ICD-10-CM

## 2013-01-13 DIAGNOSIS — G609 Hereditary and idiopathic neuropathy, unspecified: Secondary | ICD-10-CM

## 2013-01-13 DIAGNOSIS — E109 Type 1 diabetes mellitus without complications: Secondary | ICD-10-CM

## 2013-01-13 DIAGNOSIS — Z125 Encounter for screening for malignant neoplasm of prostate: Secondary | ICD-10-CM

## 2013-01-13 DIAGNOSIS — R809 Proteinuria, unspecified: Secondary | ICD-10-CM

## 2013-01-13 DIAGNOSIS — I10 Essential (primary) hypertension: Secondary | ICD-10-CM

## 2013-01-13 DIAGNOSIS — Z79899 Other long term (current) drug therapy: Secondary | ICD-10-CM

## 2013-01-13 LAB — LIPID PANEL
HDL: 53.9 mg/dL (ref >39.00)
LDL Cholesterol: 65 mg/dL (ref 0–99)
Total CHOL/HDL Ratio: 3
Triglycerides: 96 mg/dL (ref 0.0–149.0)

## 2013-01-13 LAB — CBC WITH DIFFERENTIAL/PLATELET
Basophils Absolute: 0 10*3/uL (ref 0.0–0.1)
Basophils Relative: 1.1 % (ref 0.0–3.0)
Eosinophils Absolute: 0.2 10*3/uL (ref 0.0–0.7)
Hemoglobin: 12.9 g/dL — ABNORMAL LOW (ref 13.0–17.0)
Lymphs Abs: 1 10*3/uL (ref 0.7–4.0)
MCHC: 33.3 g/dL (ref 30.0–36.0)
MCV: 90.6 fl (ref 78.0–100.0)
Monocytes Absolute: 0.4 10*3/uL (ref 0.1–1.0)
Neutro Abs: 2.5 10*3/uL (ref 1.4–7.7)
RBC: 4.28 Mil/uL (ref 4.22–5.81)
RDW: 14.6 % (ref 11.5–14.6)

## 2013-01-13 LAB — HEPATIC FUNCTION PANEL
ALT: 18 U/L (ref 0–53)
AST: 15 U/L (ref 0–37)
Albumin: 3.6 g/dL (ref 3.5–5.2)
Alkaline Phosphatase: 120 U/L — ABNORMAL HIGH (ref 39–117)
Total Protein: 6.4 g/dL (ref 6.0–8.3)

## 2013-01-13 LAB — BASIC METABOLIC PANEL
CO2: 28 mEq/L (ref 19–32)
Calcium: 9 mg/dL (ref 8.4–10.5)
Chloride: 107 mEq/L (ref 96–112)
Creatinine, Ser: 1 mg/dL (ref 0.4–1.5)
Glucose, Bld: 137 mg/dL — ABNORMAL HIGH (ref 70–99)

## 2013-01-13 LAB — URINALYSIS, ROUTINE W REFLEX MICROSCOPIC
Bilirubin Urine: NEGATIVE
Leukocytes, UA: NEGATIVE
Nitrite: NEGATIVE
Specific Gravity, Urine: 1.02 (ref 1.000–1.030)
Urobilinogen, UA: 0.2 (ref 0.0–1.0)
pH: 6 (ref 5.0–8.0)

## 2013-01-13 LAB — PSA, MEDICARE: PSA: 3.8 ng/ml (ref 0.10–4.00)

## 2013-01-13 MED ORDER — HALOBETASOL PROPIONATE 0.05 % EX CREA: TOPICAL_CREAM | Freq: Three times a day (TID) | CUTANEOUS | Status: DC | PRN

## 2013-01-13 MED ORDER — INSULIN DETEMIR 100 UNIT/ML ~~LOC~~ SOLN: 60.0000 [IU] | SUBCUTANEOUS | Status: DC

## 2013-01-13 MED ORDER — PREGABALIN 100 MG PO CAPS: 100.0000 mg | ORAL_CAPSULE | Freq: Two times a day (BID) | ORAL | Status: DC

## 2013-01-13 NOTE — Patient Instructions (Addendum)
blood tests are being requested for you today.  We'll contact you with results. Change lantus to levemir, 60 units each morning.  i have sent a prescription to your pharmacy.   check your blood sugar twice a day.  vary the time of day when you check, between before the 3 meals, and at bedtime.  also check if you have symptoms of your blood sugar being too high or too low.  please keep a record of the readings and bring it to your next appointment here.  please call us sooner if your blood sugar goes below 70, or if you have a lot of readings over 200.  blood tests are being requested for you today.  We'll contact you with results. please consider these measures for your health:  minimize alcohol.  do not use tobacco products.  have a colonoscopy at least every 10 years from age 75.  keep firearms safely stored.  always use seat belts.  have working smoke alarms in your home.  see an eye doctor and dentist regularly.  never drive under the influence of alcohol or drugs (including prescription drugs).   please let me know what your wishes would be, if artificial life support measures should become necessary.  it is critically important to prevent falling down (keep floor areas well-lit, dry, and free of loose objects.  If you have a cane, walker, or wheelchair, you should use it, even for short trips around the house.  Also, try not to rush).   Please come back for a follow-up appointment in 3 months

## 2013-01-13 NOTE — Progress Notes (Signed)
Subjective:    Patient ID: Dylan Quinn, male    DOB: 1936-07-30, 77 y.o.   MRN: [id number]  HPI The state of at least three ongoing medical problems is addressed today, with interval history of each noted here: Pt returns for f/u of type 1 dm (dx'ed 1997; complicated by retinopathy, peripheral sensory neuropathy, and nephropathy; wife has said pt does not take insulin as rx'ed). no cbg record, but states cbg's are still frequently low in am. HTN: he denies weight change. Hypogonadism: he no longer takes clomid.  He still has ED sxs, but he declines rx for hypogonadism now.  He denies decreased urinary stream Past Medical History  Diagnosis Date  . BENIGN PROSTATIC HYPERTROPHY 07/19/2009  . COLONIC POLYPS 02/14/2010  . DEPRESSION 03/16/2008  . DIABETES MELLITUS, TYPE I 06/30/2007  . FOLLICULITIS 07/28/2008  . GLAUCOMA 07/19/2009  . HEARING LOSS 02/14/2010  . HYPERTENSION 05/14/2008  . HYPOGONADISM, MALE 12/16/2007  . LUMBAR RADICULOPATHY, LEFT 02/23/2010  . Other osteoporosis 12/16/2007  . PERIPHERAL NEUROPATHY 06/30/2007  . PROTEINURIA, MILD 02/14/2010  . URINARY CALCULUS 12/27/2010  . Dyslipidemia   . Leukopenia   . Psoriasis     Past Surgical History  Procedure Date  . Cataract extraction     History   Social History  . Marital Status: Married    Spouse Name: N/A    Number of Children: 2  . Years of Education: N/A   Occupational History  . Retired    Social History Main Topics  . Smoking status: Never Smoker   . Smokeless tobacco: Not on file  . Alcohol Use: Yes     Comment: rare  . Drug Use: No  . Sexually Active:    Other Topics Concern  . 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 aspirin 81 MG tablet Take 81 mg by mouth daily.        . clobetasol (TEMOVATE) 0.05 % cream Apply topically 3 (three) times daily as needed.  30 g  1  .  glucose blood (ONE TOUCH ULTRA TEST) test strip Use as instructed six times a day, dx 250.01, variable glucoses.      Marland Kitchen insulin detemir (LEVEMIR FLEXPEN) 100 UNIT/ML injection Inject 60 Units into the skin every morning. And pen needles 1/day  30 mL  12  . Insulin Pen Needle (BD PEN NEEDLE NANO U/F) 32G X 4 MM MISC Use as directed four times daily  dx 250.01  360 each  2  . lisinopril (PRINIVIL,ZESTRIL) 20 MG tablet TAKE 1 TABLET BY MOUTH DAILY  30 tablet  4  . pregabalin (LYRICA) 100 MG capsule Take 1 capsule (100 mg total) by mouth 2 (two) times daily.  60 capsule  5  . sildenafil (VIAGRA) 100 MG tablet Take 1 tablet (100 mg total) by mouth daily as needed for erectile dysfunction.  30 tablet  11  . zolpidem (AMBIEN) 5 MG tablet Take 1 tablet (5 mg total) by mouth at bedtime as needed for sleep.  30 tablet  2    No Known Allergies  Family History  Problem Relation Age of Onset  . Cancer Father     Colon Cancer    BP 134/72  Pulse 67  Wt 220 lb (99.791 kg)  SpO2 97%  Review of Systems Denies LOC.  He still has foot  pain at night.    Objective:   Physical Exam Pulses: dorsalis pedis intact bilat.   Feet: no deformity.  no ulcer on the feet.  feet are of normal color and temp.  no edema. Neuro: sensation is intact to touch on the feet. Lab Results  Component Value Date   WBC 4.2* 01/13/2013   HGB 12.9* 01/13/2013   HCT 38.7* 01/13/2013   PLT 199.0 01/13/2013   GLUCOSE 137* 01/13/2013   CHOL 138 01/13/2013   TRIG 96.0 01/13/2013   HDL 53.90 01/13/2013   LDLCALC 65 01/13/2013   ALT 18 01/13/2013   AST 15 01/13/2013   NA 138 01/13/2013   K 4.2 01/13/2013   CL 107 01/13/2013   CREATININE 1.0 01/13/2013   BUN 18 01/13/2013   CO2 28 01/13/2013   TSH 0.72 01/13/2013   PSA 3.80 01/13/2013   HGBA1C 12.7* 01/13/2013   MICROALBUR 11.8* 01/13/2013      Assessment & Plan:  DM: needs increased rx.  The pattern of cbg's indicates he needs a shorter-acting insulin. Hypogonadism.  Further rx  declined. HTN: well-controlled     Subjective:   Patient here for Medicare annual wellness visit and management of other chronic and acute problems.     Risk factors: advanced age    Roster of Physicians Providing Medical Care to Patient:  See "snapshot"   Activities of Daily Living: In your present state of health, do you have any difficulty performing the following activities?:  Preparing food and eating?: No  Bathing yourself: No  Getting dressed: No  Using the toilet:No  Moving around from place to place: No  In the past year have you fallen or had a near fall?:No    Home Safety: Has smoke detector and wears seat belts. No firearms. No excess sun exposure.  Diet and Exercise  Current exercise habits: pt says not very good Dietary issues discussed: pt reports a healthy diet   Depression Screen  Q1: Over the past two weeks, have you felt down, depressed or hopeless? no  Q2: Over the past two weeks, have you felt little interest or pleasure in doing things? no   The following portions of the patient's history were reviewed and updated as appropriate: allergies, current medications, past family history, past medical history, past social history, past surgical history and problem list.  Past Medical History  Diagnosis Date  . BENIGN PROSTATIC HYPERTROPHY 07/19/2009  . COLONIC POLYPS 02/14/2010  . DEPRESSION 03/16/2008  . DIABETES MELLITUS, TYPE I 06/30/2007  . FOLLICULITIS 07/28/2008  . GLAUCOMA 07/19/2009  . HEARING LOSS 02/14/2010  . HYPERTENSION 05/14/2008  . HYPOGONADISM, MALE 12/16/2007  . LUMBAR RADICULOPATHY, LEFT 02/23/2010  . Other osteoporosis 12/16/2007  . PERIPHERAL NEUROPATHY 06/30/2007  . PROTEINURIA, MILD 02/14/2010  . URINARY CALCULUS 12/27/2010  . Dyslipidemia   . Leukopenia   . Psoriasis     Past Surgical History  Procedure Date  . Cataract extraction     History   Social History  . Marital Status: Married    Spouse Name: N/A    Number of Children: 2    . Years of Education: N/A   Occupational History  . Retired    Social History Main Topics  . Smoking status: Never Smoker   . Smokeless tobacco: Not on file  . Alcohol Use: Yes     Comment: rare  . Drug Use: No  . Sexually Active:    Other Topics Concern  . 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 aspirin 81 MG tablet Take 81 mg by mouth daily.        . clobetasol (TEMOVATE) 0.05 % cream Apply topically 3 (three) times daily as needed.  30 g  1  . glucose blood (ONE TOUCH ULTRA TEST) test strip Use as instructed six times a day, dx 250.01, variable glucoses, insulin pump       . halobetasol (ULTRAVATE) 0.05 % cream Apply topically 3 (three) times daily as needed. For rash  50 g  1  . insulin detemir (LEVEMIR FLEXPEN) 100 UNIT/ML injection Inject 60 Units into the skin every morning. And pen needles 1/day  30 mL  12  . Insulin Pen Needle (BD PEN NEEDLE NANO U/F) 32G X 4 MM MISC Use as directed four times daily  dx 250.01  360 each  2  . lisinopril (PRINIVIL,ZESTRIL) 20 MG tablet TAKE 1 TABLET BY MOUTH DAILY  30 tablet  4  . pregabalin (LYRICA) 100 MG capsule Take 100 mg by mouth 2 (two) times daily.      . sildenafil (VIAGRA) 100 MG tablet Take 1 tablet (100 mg total) by mouth daily as needed for erectile dysfunction.  30 tablet  11  . zolpidem (AMBIEN) 5 MG tablet Take 1 tablet (5 mg total) by mouth at bedtime as needed for sleep.  30 tablet  2    No Known Allergies  Family History  Problem Relation Age of Onset  . Cancer Father     Colon Cancer    BP 134/72  Pulse 67  Wt 220 lb (99.791 kg)  SpO2 97% Review of Systems  Denies hearing loss, and visual loss Objective:   Vision:  Sees opthalmologist Hearing: grossly normal Body mass index:  See vs page Msk: pt easily and quickly performs "get-up-and-go" from a sitting position Cognitive  Impairment Assessment: cognition, memory and judgment appear normal.  remembers 3/3 at 5 minutes.  excellent recall.  can easily read and write a sentence.  alert and oriented x 3.    Assessment:   Medicare wellness utd on preventive parameters.    Plan:   During the course of the visit the patient was educated and counseled about appropriate screening and preventive services including:        Fall prevention   Diabetes screening  Nutrition counseling   Vaccines / LABS Zostavax / Pnemonccoal Vaccine  today  PSA  Patient Instructions (the written plan) was given to the patient.

## 2013-01-19 ENCOUNTER — Other Ambulatory Visit: Payer: Self-pay | Admitting: *Deleted

## 2013-01-19 MED ORDER — LISINOPRIL 20 MG PO TABS: 20.0000 mg | ORAL_TABLET | Freq: Every day | ORAL | Status: DC

## 2013-01-23 ENCOUNTER — Telehealth: Payer: Self-pay

## 2013-01-23 NOTE — Telephone Encounter (Signed)
Pt's wife called  Stating pt's blood sugar has been up and down is there a rx you can add in addition to other meds to keep blood sugar level, please advise [phone]

## 2013-01-23 NOTE — Telephone Encounter (Signed)
We would need to sit down and review cbg record together, which i am always happy to do.

## 2013-01-28 ENCOUNTER — Encounter: Payer: Self-pay | Admitting: Endocrinology

## 2013-01-28 ENCOUNTER — Ambulatory Visit (INDEPENDENT_AMBULATORY_CARE_PROVIDER_SITE_OTHER): Payer: Medicare Other | Admitting: Endocrinology

## 2013-01-28 VITALS — BP 136/80 | HR 71 | Wt 221.0 lb

## 2013-01-28 DIAGNOSIS — E109 Type 1 diabetes mellitus without complications: Secondary | ICD-10-CM

## 2013-01-28 NOTE — Progress Notes (Signed)
Subjective:    Patient ID: Dylan Quinn, male    DOB: Jul 01, 1936, 77 y.o.   MRN: [id number]  Diabetes  Pt returns for f/u of type 1 dm (dx'ed 1997; complicated by retinopathy, peripheral sensory neuropathy, and nephropathy; wife has said pt does not take insulin as rx'ed).  He takes a widely varying dosage of levemir (sometimes qam, sometimes bid).  he brings a record of his cbg's which i have reviewed today.  It varies from 49-585.  There is no trend throughout the day.  He is here today with his son. Pt and son both say his diet is poor.   Past Medical History  Diagnosis Date  . BENIGN PROSTATIC HYPERTROPHY 07/19/2009  . COLONIC POLYPS 02/14/2010  . DEPRESSION 03/16/2008  . DIABETES MELLITUS, TYPE I 06/30/2007  . FOLLICULITIS 07/28/2008  . GLAUCOMA 07/19/2009  . HEARING LOSS 02/14/2010  . HYPERTENSION 05/14/2008  . HYPOGONADISM, MALE 12/16/2007  . LUMBAR RADICULOPATHY, LEFT 02/23/2010  . Other osteoporosis 12/16/2007  . PERIPHERAL NEUROPATHY 06/30/2007  . PROTEINURIA, MILD 02/14/2010  . URINARY CALCULUS 12/27/2010  . Dyslipidemia   . Leukopenia   . Psoriasis     Past Surgical History  Procedure Date  . Cataract extraction     History   Social History  . Marital Status: Married    Spouse Name: N/A    Number of Children: 2  . Years of Education: N/A   Occupational History  . Retired    Social History Main Topics  . Smoking status: Never Smoker   . Smokeless tobacco: Not on file  . Alcohol Use: Yes     Comment: rare  . Drug Use: No  . Sexually Active:    Other Topics Concern  . 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 aspirin 81 MG tablet Take 81 mg by mouth daily.        . clobetasol (TEMOVATE) 0.05 % cream Apply topically 3 (three) times daily as needed.  30 g  1  . glucose blood (ONE TOUCH ULTRA TEST) test strip Use as instructed six times  a day, dx 250.01, variable glucoses.      . halobetasol (ULTRAVATE) 0.05 % cream Apply topically 3 (three) times daily as needed. For rash  50 g  1  . insulin detemir (LEVEMIR FLEXPEN) 100 UNIT/ML injection Inject 60 Units into the skin every morning. And pen needles 1/day  30 mL  12  . Insulin Pen Needle (BD PEN NEEDLE NANO U/F) 32G X 4 MM MISC Use as directed four times daily  dx 250.01  360 each  2  . lisinopril (PRINIVIL,ZESTRIL) 20 MG tablet Take 1 tablet (20 mg total) by mouth daily.  30 tablet  4  . pregabalin (LYRICA) 100 MG capsule Take 1 capsule (100 mg total) by mouth 2 (two) times daily.  60 capsule  5  . sildenafil (VIAGRA) 100 MG tablet Take 1 tablet (100 mg total) by mouth daily as needed for erectile dysfunction.  30 tablet  11  . zolpidem (AMBIEN) 5 MG tablet Take 1 tablet (5 mg total) by mouth at bedtime as needed for sleep.  30 tablet  2    No Known Allergies  Family History  Problem Relation Age of Onset  . Cancer Father     Colon Cancer    BP 136/80  Pulse 71  Wt 221 lb (100.245 kg)  SpO2 97%  Review of Systems Denies LOC.  He still has foot pain at night.    Objective:   Physical Exam VITAL SIGNS:  See vs page GENERAL: no distress PSYCH: Alert and oriented x 3.  Does not appear anxious nor depressed.    Assessment & Plan:  DM: therapy limited by noncompliance, and by his need for a simple regimen.  i'll do the best i can.

## 2013-01-28 NOTE — Patient Instructions (Addendum)
Change lantus to levemir, 60 units each morning.  i have sent a prescription to your pharmacy.   Please come back for a follow-up appointment in 2 weeks.   On this type of insulin schedule, you should not miss or delay meals. check your blood sugar twice a day.  vary the time of day when you check, between before the 3 meals, and at bedtime.  also check if you have symptoms of your blood sugar being too high or too low.  please keep a record of the readings and bring it to your next appointment here.  please call us sooner if your blood sugar goes below 70, or if you have a lot of readings over 200.

## 2013-02-10 ENCOUNTER — Telehealth: Payer: Self-pay

## 2013-02-10 NOTE — Telephone Encounter (Signed)
Called patient left message, regarding [facility] fax, does pt want these items?

## 2013-02-11 NOTE — Telephone Encounter (Signed)
Per pt he does not want supplies

## 2013-03-31 ENCOUNTER — Ambulatory Visit: Payer: Medicare Other | Admitting: Endocrinology

## 2013-03-31 ENCOUNTER — Telehealth: Payer: Self-pay

## 2013-03-31 ENCOUNTER — Encounter: Payer: Self-pay | Admitting: Endocrinology

## 2013-03-31 ENCOUNTER — Ambulatory Visit (INDEPENDENT_AMBULATORY_CARE_PROVIDER_SITE_OTHER): Payer: Medicare Other | Admitting: Endocrinology

## 2013-03-31 VITALS — BP 126/78 | HR 78 | Wt 209.0 lb

## 2013-03-31 DIAGNOSIS — G479 Sleep disorder, unspecified: Secondary | ICD-10-CM

## 2013-03-31 DIAGNOSIS — E109 Type 1 diabetes mellitus without complications: Secondary | ICD-10-CM

## 2013-03-31 MED ORDER — INSULIN NPH (HUMAN) (ISOPHANE) 100 UNIT/ML ~~LOC~~ SUSP: 50.0000 [IU] | SUBCUTANEOUS | Status: DC

## 2013-03-31 NOTE — Telephone Encounter (Signed)
Please advise ov 

## 2013-03-31 NOTE — Progress Notes (Signed)
Subjective:    Patient ID: Dylan Quinn, male    DOB: 1936/09/12, 77 y.o.   MRN: [id number]  HPI Pt returns for f/u of type 1 dm (dx'ed 1997; complicated by retinopathy, peripheral sensory neuropathy, and nephropathy).  no cbg record, but states cbg's vary from 40-400.  It is in general higher as the day goes on.  He says his diet is not good.  He has generalized weakness.    Pt states 1 week of slight heartburn sensation at the epigastric area, but no assoc n/v.  tums relieves the sxs.   Painful neuropathy: lyrica is helping, but wife says he is drowsy.  Wife wonders if lyrica is causing these sxs.   Past Medical History  Diagnosis Date  . BENIGN PROSTATIC HYPERTROPHY 07/19/2009  . COLONIC POLYPS 02/14/2010  . DEPRESSION 03/16/2008  . DIABETES MELLITUS, TYPE I 06/30/2007  . FOLLICULITIS 07/28/2008  . GLAUCOMA 07/19/2009  . HEARING LOSS 02/14/2010  . HYPERTENSION 05/14/2008  . HYPOGONADISM, MALE 12/16/2007  . LUMBAR RADICULOPATHY, LEFT 02/23/2010  . Other osteoporosis 12/16/2007  . PERIPHERAL NEUROPATHY 06/30/2007  . PROTEINURIA, MILD 02/14/2010  . URINARY CALCULUS 12/27/2010  . Dyslipidemia   . Leukopenia   . Psoriasis     Past Surgical History  Procedure Laterality Date  . Cataract extraction      History   Social History  . Marital Status: Married    Spouse Name: N/A    Number of Children: 2  . Years of Education: N/A   Occupational History  . Retired    Social History Main Topics  . Smoking status: Never Smoker   . Smokeless tobacco: Not on file  . Alcohol Use: Yes     Comment: rare  . Drug Use: No  . Sexually Active:    Other Topics Concern  . 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 aspirin 81 MG tablet Take 81 mg by mouth daily.        . clobetasol (TEMOVATE) 0.05 % cream Apply topically 3 (three) times daily as needed.  30 g  1  .  glucose blood (ONE TOUCH ULTRA TEST) test strip Use as instructed six times a day, dx 250.01, variable glucoses.      . halobetasol (ULTRAVATE) 0.05 % cream Apply topically 3 (three) times daily as needed. For rash  50 g  1  . insulin detemir (LEVEMIR FLEXPEN) 100 UNIT/ML injection Inject 60 Units into the skin every morning. And pen needles 1/day  30 mL  12  . Insulin Pen Needle (BD PEN NEEDLE NANO U/F) 32G X 4 MM MISC Use as directed four times daily  dx 250.01  360 each  2  . lisinopril (PRINIVIL,ZESTRIL) 20 MG tablet Take 1 tablet (20 mg total) by mouth daily.  30 tablet  4  . pregabalin (LYRICA) 100 MG capsule Take 1 capsule (100 mg total) by mouth 2 (two) times daily.  60 capsule  5  . sildenafil (VIAGRA) 100 MG tablet Take 1 tablet (100 mg total) by mouth daily as needed for erectile dysfunction.  30 tablet  11  . zolpidem (AMBIEN) 5 MG tablet Take 1 tablet (5 mg total) by mouth at bedtime as needed for sleep.  30 tablet  2   No current facility-administered medications on file prior to visit.   No Known  Allergies  Family History  Problem Relation Age of Onset  . Cancer Father     Colon Cancer  mother has thyroid probs  There were no vitals taken for this visit.  Review of Systems Wife says pt stops breathing at night.  Denies falls.  He has lost a few lbs.     Objective:   Physical Exam VITAL SIGNS:  See vs page GENERAL: no distress LUNGS:  Clear to auscultation HEART:  Regular rate and rhythm without murmurs noted. Normal S1,S2.   Pulses: dorsalis pedis intact bilat.   Feet: no deformity.  no ulcer on the feet.  feet are of normal color and temp.  no edema Neuro: sensation is intact to touch on the feet, but decreased from normal.      Assessment & Plan:  DM: based on cbg pattern, he needs a shorter-acting insulin. Sleep disorder, new Heartburn, new Painful peripheral neuropathy.  Drowsiness may be caused by lyrica, but it helping.

## 2013-03-31 NOTE — Telephone Encounter (Signed)
Per pt's son, pt has been feeling sick, and would like to get an appointment. [phone]

## 2013-03-31 NOTE — Patient Instructions (Addendum)
Please change your current insulin to NPH, 50 units each morning.  i have sent a prescription to your pharmacy.   check your blood sugar 4 times a day--before the 3 meals, and at bedtime.  also check if you have symptoms of your blood sugar being too high or too low.  please keep a record of the readings and bring it to your next appointment here.  please call us sooner if your blood sugar goes below 70, or if you have a lot of readings over 200.   On a trial basis, take 2 prilosec otc pills per day. For now, please continue the same lyrica.  We'll see if helpig your blood sugar helps your symptoms of fatigue.   Refer to podiatry and sleep specialists.  you will receive 2 phone calls, about days and times for appointments.  When you take an extended-release insulin such as this, it is unsafe to miss or delay meals.   Try to at least cut your intake of sweet tea in half.   Please come back for a follow-up appointment in 2 weeks.

## 2013-04-02 ENCOUNTER — Telehealth: Payer: Self-pay | Admitting: *Deleted

## 2013-04-02 NOTE — Telephone Encounter (Signed)
Take an extra 10 now.  Then tomorrow, start 60 units qam

## 2013-04-02 NOTE — Telephone Encounter (Signed)
LVM that Dr Everardo All stated to take an extra 10 units now and then tomorrow, start 60 units qam of the Humulin. Please call and let us know you received this message.

## 2013-04-02 NOTE — Telephone Encounter (Signed)
Patients wife called stating that his bg continues to stay above 200. He began the Humulin N (NPH) yesterday. Pt's bg readings have been 410, 570 and 396. Pt is often lethargic, sleeping and not eating hardly. She is concerned and would like some advice. Please advise.

## 2013-04-03 ENCOUNTER — Telehealth: Payer: Self-pay | Admitting: *Deleted

## 2013-04-03 NOTE — Telephone Encounter (Signed)
Called pt's wife to see if she received the message I left yesterday. She stated she did not. I advised her per Dr Everardo All, that he was to take extra 10 units now and then tomorrow, start 60 units qam of the Humulin. This was for yesterday. Advised him to take 60 units this morning of the Humulin and to keep an eye on his bg levels and call us to let us know. Pt's wife understood.

## 2013-04-14 ENCOUNTER — Ambulatory Visit: Payer: Medicare Other | Admitting: Endocrinology

## 2013-04-23 ENCOUNTER — Institutional Professional Consult (permissible substitution): Payer: Medicare Other | Admitting: Pulmonary Disease

## 2013-04-27 ENCOUNTER — Encounter: Payer: Self-pay | Admitting: Endocrinology

## 2013-04-27 ENCOUNTER — Ambulatory Visit (INDEPENDENT_AMBULATORY_CARE_PROVIDER_SITE_OTHER): Payer: Medicare Other | Admitting: Endocrinology

## 2013-04-27 VITALS — BP 138/80 | HR 77 | Wt 214.0 lb

## 2013-04-27 DIAGNOSIS — E109 Type 1 diabetes mellitus without complications: Secondary | ICD-10-CM

## 2013-04-27 MED ORDER — HALOBETASOL PROPIONATE 0.05 % EX CREA: TOPICAL_CREAM | Freq: Three times a day (TID) | CUTANEOUS | Status: DC | PRN

## 2013-04-27 MED ORDER — INSULIN ASPART PROT & ASPART (70-30 MIX) 100 UNIT/ML PEN: 60.0000 [IU] | PEN_INJECTOR | Freq: Every day | SUBCUTANEOUS | Status: DC

## 2013-04-27 NOTE — Progress Notes (Signed)
  Subjective:    Patient ID: Dylan Quinn, male    DOB: 01-Dec-1936, 77 y.o.   MRN: [id number]  HPI Pt returns for f/u of type 1 dm (dx'ed 1997; complicated by retinopathy, peripheral sensory neuropathy, and nephropathy; he has never had severe hypoglycemia or DKA).  no cbg record, but states cbg's vary from 43-400.  It is in general higher as the day goes on.  He checks only at breakfast, and at hs.  Past Medical History  Diagnosis Date  . BENIGN PROSTATIC HYPERTROPHY 07/19/2009  . COLONIC POLYPS 02/14/2010  . DEPRESSION 03/16/2008  . DIABETES MELLITUS, TYPE I 06/30/2007  . FOLLICULITIS 07/28/2008  . GLAUCOMA 07/19/2009  . HEARING LOSS 02/14/2010  . HYPERTENSION 05/14/2008  . HYPOGONADISM, MALE 12/16/2007  . LUMBAR RADICULOPATHY, LEFT 02/23/2010  . Other osteoporosis 12/16/2007  . PERIPHERAL NEUROPATHY 06/30/2007  . PROTEINURIA, MILD 02/14/2010  . URINARY CALCULUS 12/27/2010  . Dyslipidemia   . Leukopenia   . Psoriasis     Past Surgical History  Procedure Laterality Date  . Cataract extraction      History   Social History  . Marital Status: Married    Spouse Name: N/A    Number of Children: 2  . Years of Education: N/A   Occupational History  . Retired    Social History Main Topics  . Smoking status: Never Smoker   . Smokeless tobacco: Not on file  . Alcohol Use: Yes     Comment: rare  . Drug Use: No  . Sexually Active:    Other Topics Concern  . 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 aspirin 81 MG tablet Take 81 mg by mouth daily.        . clobetasol (TEMOVATE) 0.05 % cream Apply topically 3 (three) times daily as needed.  30 g  1  . glucose blood (ONE TOUCH ULTRA TEST) test strip Use as instructed six times a day, dx 250.01, variable glucoses.      . Insulin Pen Needle (BD PEN NEEDLE NANO U/F) 32G X 4 MM MISC Use as directed four times  daily  dx 250.01  360 each  2  . lisinopril (PRINIVIL,ZESTRIL) 20 MG tablet Take 1 tablet (20 mg total) by mouth daily.  30 tablet  4  . pregabalin (LYRICA) 100 MG capsule Take 1 capsule (100 mg total) by mouth 2 (two) times daily.  60 capsule  5  . zolpidem (AMBIEN) 5 MG tablet Take 1 tablet (5 mg total) by mouth at bedtime as needed for sleep.  30 tablet  2  . sildenafil (VIAGRA) 100 MG tablet Take 1 tablet (100 mg total) by mouth daily as needed for erectile dysfunction.  30 tablet  11   No current facility-administered medications on file prior to visit.   No Known Allergies  Family History  Problem Relation Age of Onset  . Cancer Father     Colon Cancer    BP 138/80  Pulse 77  Wt 214 lb (97.07 kg)  BMI 29.86 kg/m2  SpO2 95%  Review of Systems Denies LOC    Objective:   Physical Exam VITAL SIGNS:  See vs page GENERAL: no distress      Assessment & Plan:  DM: The pattern of his cbg's indicates he needs a shorter-acting insulin.

## 2013-04-27 NOTE — Patient Instructions (Addendum)
Please change your current insulin to novolog 70/30, 60 units each morning, with breakfast.  i have sent a prescription to your pharmacy.   check your blood sugar 4 times a day--before the 3 meals, and at bedtime.  also check if you have symptoms of your blood sugar being too high or too low.  please keep a record of the readings and bring it to your next appointment here.  please call us sooner if your blood sugar goes below 70, or if you have a lot of readings over 200.   For now, please continue the same lyrica.  We'll see if helpig your blood sugar helps your symptoms of fatigue.   When you take an extended-release insulin such as this, it is unsafe to miss or delay meals.   Please come back for a follow-up appointment in 2 weeks.

## 2013-05-04 ENCOUNTER — Inpatient Hospital Stay (HOSPITAL_COMMUNITY)
Admission: EM | Admit: 2013-05-04 | Discharge: 2013-05-05 | DRG: 639 | Disposition: A | Discharge status: Home Health | Payer: Medicare Other | Attending: Internal Medicine | Admitting: Internal Medicine

## 2013-05-04 ENCOUNTER — Inpatient Hospital Stay (HOSPITAL_COMMUNITY): Payer: Medicare Other

## 2013-05-04 ENCOUNTER — Encounter (HOSPITAL_COMMUNITY): Payer: Self-pay | Admitting: Emergency Medicine

## 2013-05-04 DIAGNOSIS — L408 Other psoriasis: Secondary | ICD-10-CM

## 2013-05-04 DIAGNOSIS — E1159 Type 2 diabetes mellitus with other circulatory complications: Secondary | ICD-10-CM | POA: Diagnosis present

## 2013-05-04 DIAGNOSIS — R809 Proteinuria, unspecified: Secondary | ICD-10-CM | POA: Diagnosis present

## 2013-05-04 DIAGNOSIS — Z125 Encounter for screening for malignant neoplasm of prostate: Secondary | ICD-10-CM

## 2013-05-04 DIAGNOSIS — E109 Type 1 diabetes mellitus without complications: Secondary | ICD-10-CM

## 2013-05-04 DIAGNOSIS — R05 Cough: Secondary | ICD-10-CM

## 2013-05-04 DIAGNOSIS — E1110 Type 2 diabetes mellitus with ketoacidosis without coma: Secondary | ICD-10-CM

## 2013-05-04 DIAGNOSIS — Z79899 Other long term (current) drug therapy: Secondary | ICD-10-CM

## 2013-05-04 DIAGNOSIS — E1065 Type 1 diabetes mellitus with hyperglycemia: Secondary | ICD-10-CM | POA: Diagnosis present

## 2013-05-04 DIAGNOSIS — E1010 Type 1 diabetes mellitus with ketoacidosis without coma: Principal | ICD-10-CM

## 2013-05-04 DIAGNOSIS — L409 Psoriasis, unspecified: Secondary | ICD-10-CM

## 2013-05-04 DIAGNOSIS — H409 Unspecified glaucoma: Secondary | ICD-10-CM

## 2013-05-04 DIAGNOSIS — E1142 Type 2 diabetes mellitus with diabetic polyneuropathy: Secondary | ICD-10-CM | POA: Diagnosis present

## 2013-05-04 DIAGNOSIS — F329 Major depressive disorder, single episode, unspecified: Secondary | ICD-10-CM | POA: Diagnosis present

## 2013-05-04 DIAGNOSIS — D126 Benign neoplasm of colon, unspecified: Secondary | ICD-10-CM

## 2013-05-04 DIAGNOSIS — E1049 Type 1 diabetes mellitus with other diabetic neurological complication: Secondary | ICD-10-CM | POA: Diagnosis present

## 2013-05-04 DIAGNOSIS — L738 Other specified follicular disorders: Secondary | ICD-10-CM

## 2013-05-04 DIAGNOSIS — I10 Essential (primary) hypertension: Secondary | ICD-10-CM | POA: Diagnosis present

## 2013-05-04 DIAGNOSIS — G4700 Insomnia, unspecified: Secondary | ICD-10-CM

## 2013-05-04 DIAGNOSIS — H9190 Unspecified hearing loss, unspecified ear: Secondary | ICD-10-CM

## 2013-05-04 DIAGNOSIS — M818 Other osteoporosis without current pathological fracture: Secondary | ICD-10-CM

## 2013-05-04 DIAGNOSIS — N209 Urinary calculus, unspecified: Secondary | ICD-10-CM

## 2013-05-04 DIAGNOSIS — R209 Unspecified disturbances of skin sensation: Secondary | ICD-10-CM

## 2013-05-04 DIAGNOSIS — R059 Cough, unspecified: Secondary | ICD-10-CM

## 2013-05-04 DIAGNOSIS — G609 Hereditary and idiopathic neuropathy, unspecified: Secondary | ICD-10-CM

## 2013-05-04 DIAGNOSIS — D649 Anemia, unspecified: Secondary | ICD-10-CM

## 2013-05-04 DIAGNOSIS — Z794 Long term (current) use of insulin: Secondary | ICD-10-CM

## 2013-05-04 DIAGNOSIS — N400 Enlarged prostate without lower urinary tract symptoms: Secondary | ICD-10-CM

## 2013-05-04 DIAGNOSIS — E291 Testicular hypofunction: Secondary | ICD-10-CM | POA: Diagnosis present

## 2013-05-04 DIAGNOSIS — IMO0002 Reserved for concepts with insufficient information to code with codable children: Secondary | ICD-10-CM

## 2013-05-04 DIAGNOSIS — R109 Unspecified abdominal pain: Secondary | ICD-10-CM

## 2013-05-04 DIAGNOSIS — G479 Sleep disorder, unspecified: Secondary | ICD-10-CM

## 2013-05-04 DIAGNOSIS — F3289 Other specified depressive episodes: Secondary | ICD-10-CM

## 2013-05-04 LAB — BASIC METABOLIC PANEL
BUN: 17 mg/dL (ref 6–23)
BUN: 15 mg/dL (ref 6–23)
CO2: 21 mEq/L (ref 19–32)
CO2: 21 mEq/L (ref 19–32)
Calcium: 9.3 mg/dL (ref 8.4–10.5)
Calcium: 8.9 mg/dL (ref 8.4–10.5)
Calcium: 9 mg/dL (ref 8.4–10.5)
Chloride: 104 mEq/L (ref 96–112)
Chloride: 104 mEq/L (ref 96–112)
Creatinine, Ser: 0.86 mg/dL (ref 0.50–1.35)
Creatinine, Ser: 0.83 mg/dL (ref 0.50–1.35)
Creatinine, Ser: 0.82 mg/dL (ref 0.50–1.35)
GFR calc Af Amer: >90 mL/min (ref >90)
GFR calc Af Amer: >90 mL/min (ref >90)
GFR calc non Af Amer: 83 mL/min — ABNORMAL LOW (ref >90)
GFR calc non Af Amer: 84 mL/min — ABNORMAL LOW (ref >90)
GFR calc non Af Amer: 84 mL/min — ABNORMAL LOW (ref >90)
GFR calc non Af Amer: 87 mL/min — ABNORMAL LOW (ref >90)
Glucose, Bld: 319 mg/dL — ABNORMAL HIGH (ref 70–99)
Glucose, Bld: 183 mg/dL — ABNORMAL HIGH (ref 70–99)
Potassium: 3.9 mEq/L (ref 3.5–5.1)
Sodium: 136 mEq/L (ref 135–145)
Sodium: 137 mEq/L (ref 135–145)

## 2013-05-04 LAB — COMPREHENSIVE METABOLIC PANEL
ALT: 7 U/L (ref 0–53)
AST: 8 U/L (ref 0–37)
CO2: 16 mEq/L — ABNORMAL LOW (ref 19–32)
Calcium: 9.1 mg/dL (ref 8.4–10.5)
Chloride: 95 mEq/L — ABNORMAL LOW (ref 96–112)
GFR calc Af Amer: >90 mL/min (ref >90)
GFR calc non Af Amer: 80 mL/min — ABNORMAL LOW (ref >90)
Glucose, Bld: 472 mg/dL — ABNORMAL HIGH (ref 70–99)
Sodium: 134 mEq/L — ABNORMAL LOW (ref 135–145)
Total Bilirubin: 0.5 mg/dL (ref 0.3–1.2)

## 2013-05-04 LAB — CBC WITH DIFFERENTIAL/PLATELET
Eosinophils Relative: 2 % (ref 0–5)
HCT: 38.6 % — ABNORMAL LOW (ref 39.0–52.0)
Lymphocytes Relative: 16 % (ref 12–46)
Lymphs Abs: 0.9 10*3/uL (ref 0.7–4.0)
MCV: 89.6 fL (ref 78.0–100.0)
Monocytes Absolute: 0.4 10*3/uL (ref 0.1–1.0)
Neutro Abs: 4.3 10*3/uL (ref 1.7–7.7)
Platelets: 193 10*3/uL (ref 150–400)
RBC: 4.31 MIL/uL (ref 4.22–5.81)
WBC: 5.8 10*3/uL (ref 4.0–10.5)

## 2013-05-04 LAB — GLUCOSE, CAPILLARY
Glucose-Capillary: 396 mg/dL — ABNORMAL HIGH (ref 70–99)
Glucose-Capillary: 196 mg/dL — ABNORMAL HIGH (ref 70–99)
Glucose-Capillary: 153 mg/dL — ABNORMAL HIGH (ref 70–99)
Glucose-Capillary: 183 mg/dL — ABNORMAL HIGH (ref 70–99)
Glucose-Capillary: 213 mg/dL — ABNORMAL HIGH (ref 70–99)

## 2013-05-04 LAB — BLOOD GAS, ARTERIAL
Bicarbonate: 12.7 mEq/L — ABNORMAL LOW (ref 20.0–24.0)
Patient temperature: 98.6
TCO2: 11.6 mmol/L (ref 0–100)
pCO2 arterial: 25.1 mmHg — ABNORMAL LOW (ref 35.0–45.0)
pH, Arterial: 7.325 — ABNORMAL LOW (ref 7.350–7.450)
pO2, Arterial: 103 mmHg — ABNORMAL HIGH (ref 80.0–100.0)

## 2013-05-04 LAB — URINALYSIS, ROUTINE W REFLEX MICROSCOPIC
Bilirubin Urine: NEGATIVE
Hgb urine dipstick: NEGATIVE
Ketones, ur: >80 mg/dL — AB
Nitrite: NEGATIVE
Protein, ur: NEGATIVE mg/dL
Specific Gravity, Urine: 1.035 — ABNORMAL HIGH (ref 1.005–1.030)
Urobilinogen, UA: 0.2 mg/dL (ref 0.0–1.0)

## 2013-05-04 LAB — CBC
MCV: 90.3 fL (ref 78.0–100.0)
Platelets: 234 10*3/uL (ref 150–400)
RDW: 14.1 % (ref 11.5–15.5)
WBC: 9.3 10*3/uL (ref 4.0–10.5)

## 2013-05-04 LAB — LIPASE, BLOOD: Lipase: 17 U/L (ref 11–59)

## 2013-05-04 LAB — TROPONIN I: Troponin I: <0.3 ng/mL (ref <0.30)

## 2013-05-04 LAB — URINE MICROSCOPIC-ADD ON

## 2013-05-04 LAB — MRSA PCR SCREENING: MRSA by PCR: NEGATIVE

## 2013-05-04 MED ORDER — SODIUM CHLORIDE 0.9 % IV SOLN
1000.0000 mL | INTRAVENOUS | Status: DC
  Administered 2013-05-04: 1000 mL via INTRAVENOUS

## 2013-05-04 MED ORDER — POTASSIUM CHLORIDE 10 MEQ/100ML IV SOLN
10.0000 meq | INTRAVENOUS | Status: AC
  Administered 2013-05-04 (×2): 10 meq via INTRAVENOUS
  Filled 2013-05-04: qty 200

## 2013-05-04 MED ORDER — ENOXAPARIN SODIUM 40 MG/0.4ML ~~LOC~~ SOLN
40.0000 mg | Freq: Every day | SUBCUTANEOUS | Status: DC
  Administered 2013-05-04 – 2013-05-05 (×2): 40 mg via SUBCUTANEOUS
  Filled 2013-05-04 (×2): qty 0.4

## 2013-05-04 MED ORDER — DEXTROSE 50 % IV SOLN: 25.0000 mL | INTRAVENOUS | Status: DC | PRN

## 2013-05-04 MED ORDER — INSULIN GLARGINE 100 UNIT/ML ~~LOC~~ SOLN
45.0000 [IU] | Freq: Once | SUBCUTANEOUS | Status: AC
  Administered 2013-05-04: 45 [IU] via SUBCUTANEOUS
  Filled 2013-05-04: qty 0.45

## 2013-05-04 MED ORDER — METOCLOPRAMIDE HCL 5 MG/ML IJ SOLN
10.0000 mg | Freq: Once | INTRAMUSCULAR | Status: AC
  Administered 2013-05-04: 10 mg via INTRAVENOUS
  Filled 2013-05-04: qty 2

## 2013-05-04 MED ORDER — BRIMONIDINE TARTRATE-TIMOLOL 0.2-0.5 % OP SOLN: 1.0000 [drp] | Freq: Two times a day (BID) | OPHTHALMIC | Status: DC

## 2013-05-04 MED ORDER — DORZOLAMIDE HCL 2 % OP SOLN
1.0000 [drp] | Freq: Two times a day (BID) | OPHTHALMIC | Status: DC
  Administered 2013-05-04 – 2013-05-05 (×3): 1 [drp] via OPHTHALMIC
  Filled 2013-05-04: qty 10

## 2013-05-04 MED ORDER — LATANOPROST 0.005 % OP SOLN
1.0000 [drp] | Freq: Every day | OPHTHALMIC | Status: DC
  Administered 2013-05-04: 1 [drp] via OPHTHALMIC
  Filled 2013-05-04: qty 2.5

## 2013-05-04 MED ORDER — SODIUM CHLORIDE 0.9 % IV SOLN
1000.0000 mL | Freq: Once | INTRAVENOUS | Status: AC
  Administered 2013-05-04: 1000 mL via INTRAVENOUS

## 2013-05-04 MED ORDER — CLOBETASOL PROPIONATE 0.05 % EX CREA
TOPICAL_CREAM | Freq: Three times a day (TID) | CUTANEOUS | Status: DC | PRN
  Filled 2013-05-04: qty 15

## 2013-05-04 MED ORDER — ONDANSETRON HCL 4 MG/2ML IJ SOLN
4.0000 mg | Freq: Four times a day (QID) | INTRAMUSCULAR | Status: DC | PRN
  Administered 2013-05-04: 4 mg via INTRAVENOUS
  Filled 2013-05-04: qty 2

## 2013-05-04 MED ORDER — INSULIN REGULAR BOLUS VIA INFUSION
0.0000 [IU] | Freq: Three times a day (TID) | INTRAVENOUS | Status: DC
  Filled 2013-05-04: qty 10

## 2013-05-04 MED ORDER — SODIUM CHLORIDE 0.9 % IV SOLN: INTRAVENOUS | Status: DC

## 2013-05-04 MED ORDER — INSULIN ASPART 100 UNIT/ML ~~LOC~~ SOLN
0.0000 [IU] | Freq: Three times a day (TID) | SUBCUTANEOUS | Status: DC
  Administered 2013-05-05: 5 [IU] via SUBCUTANEOUS
  Administered 2013-05-05: 8 [IU] via SUBCUTANEOUS

## 2013-05-04 MED ORDER — SODIUM CHLORIDE 0.9 % IV SOLN
INTRAVENOUS | Status: DC
  Administered 2013-05-04: 11:00:00 via INTRAVENOUS

## 2013-05-04 MED ORDER — SODIUM CHLORIDE 0.9 % IV SOLN
INTRAVENOUS | Status: DC
  Administered 2013-05-04: 08:00:00 via INTRAVENOUS
  Filled 2013-05-04: qty 1

## 2013-05-04 MED ORDER — ASPIRIN EC 81 MG PO TBEC
81.0000 mg | DELAYED_RELEASE_TABLET | Freq: Every day | ORAL | Status: DC
  Administered 2013-05-05: 81 mg via ORAL
  Filled 2013-05-04 (×2): qty 1

## 2013-05-04 MED ORDER — TIMOLOL MALEATE 0.5 % OP SOLN
1.0000 [drp] | Freq: Two times a day (BID) | OPHTHALMIC | Status: DC
  Administered 2013-05-04 – 2013-05-05 (×3): 1 [drp] via OPHTHALMIC
  Filled 2013-05-04: qty 5

## 2013-05-04 MED ORDER — INSULIN ASPART 100 UNIT/ML ~~LOC~~ SOLN: 0.0000 [IU] | Freq: Every day | SUBCUTANEOUS | Status: DC

## 2013-05-04 MED ORDER — SODIUM CHLORIDE 0.9 % IV SOLN: 1000.0000 mL | INTRAVENOUS | Status: DC

## 2013-05-04 MED ORDER — SODIUM CHLORIDE 0.9 % IV SOLN
INTRAVENOUS | Status: DC
  Filled 2013-05-04: qty 1

## 2013-05-04 MED ORDER — LABETALOL HCL 5 MG/ML IV SOLN: 10.0000 mg | INTRAVENOUS | Status: DC | PRN

## 2013-05-04 MED ORDER — BRIMONIDINE TARTRATE 0.2 % OP SOLN
1.0000 [drp] | Freq: Two times a day (BID) | OPHTHALMIC | Status: DC
  Administered 2013-05-04 – 2013-05-05 (×3): 1 [drp] via OPHTHALMIC
  Filled 2013-05-04: qty 5

## 2013-05-04 MED ORDER — PREGABALIN 100 MG PO CAPS
100.0000 mg | ORAL_CAPSULE | Freq: Two times a day (BID) | ORAL | Status: DC
  Administered 2013-05-04 – 2013-05-05 (×2): 100 mg via ORAL
  Filled 2013-05-04 (×2): qty 1

## 2013-05-04 MED ORDER — DEXTROSE-NACL 5-0.45 % IV SOLN: INTRAVENOUS | Status: DC

## 2013-05-04 MED ORDER — DEXTROSE-NACL 5-0.45 % IV SOLN
INTRAVENOUS | Status: DC
  Administered 2013-05-04: 125 mL via INTRAVENOUS

## 2013-05-04 MED ORDER — HALOBETASOL PROPIONATE 0.05 % EX CREA: 1.0000 "application " | TOPICAL_CREAM | Freq: Three times a day (TID) | CUTANEOUS | Status: DC | PRN

## 2013-05-04 MED ORDER — DIPHENHYDRAMINE HCL 50 MG/ML IJ SOLN
25.0000 mg | Freq: Once | INTRAMUSCULAR | Status: AC
  Administered 2013-05-04: 25 mg via INTRAVENOUS
  Filled 2013-05-04: qty 1

## 2013-05-04 MED ORDER — ONDANSETRON HCL 4 MG/2ML IJ SOLN: 4.0000 mg | Freq: Three times a day (TID) | INTRAMUSCULAR | Status: AC | PRN

## 2013-05-04 NOTE — Progress Notes (Signed)
CARE MANAGEMENT NOTE 05/04/2013  Patient:  SHYAM, DAWSON   Account Number:  [id number]  Date Initiated:  05/04/2013  Documentation initiated by:  DAVIS,RHONDA  Subjective/Objective Assessment:   known diabetic, does drink etoh not able to eat or drink for several days,     Action/Plan:   home with family   Anticipated DC Date:  05/07/2013   Anticipated DC Plan:  HOME/SELF CARE  In-house referral  NA      DC Planning Services  NA      [facility] Choice  NA   Choice offered to / List presented to:  NA   DME arranged  NA      DME agency  NA     HH arranged  NA      HH agency  NA   Status of service:  In process, will continue to follow Medicare Important Message given?  NA - LOS <3 / Initial given by admissions (If response is "NO", the following Medicare IM given date fields will be blank) Date Medicare IM given:   Date Additional Medicare IM given:    Discharge Disposition:    Per UR Regulation:  Reviewed for med. necessity/level of care/duration of stay  If discussed at Long Length of Stay Meetings, dates discussed:    Comments:  [id number] Earlene Plater, RN, BSN, CCM:  CHART REVIEWED AND UPDATED.  Next chart review due on [id number]. NO DISCHARGE NEEDS PRESENT AT THIS TIME. CASE MANAGEMENT [phone]

## 2013-05-04 NOTE — ED Notes (Signed)
As per EMS, pt c/o high blood sugar and nausea.pt is alert and orientated.pt was given 4mg  of Zofran en route with relief.

## 2013-05-04 NOTE — Progress Notes (Signed)
Gap closed, bicarb > 20.  Will transition to subcutaneous insulin.  Was hypoglycemic with 60 units of subcutaneous insulin and hyperglycemic with single injection of 70/30 in the morning.    -  Lantus 45 units  -  SSI moderate dose  -  HS insulin -  Have patient eat meal when turning off insulin gtt -  Insulin gtt off 2 hours after lantus given -  IVF off 2 hours after lantus given -  BMP 2 hours after insulin gtt discontinued.  (will order for ~10PM, but can be adjusted if necessary) -  Continue q1h fingersticks for 4 hours after gtt discontinued.  Thank you!

## 2013-05-04 NOTE — H&P (Addendum)
[facility] History and Physical  Dylan Quinn WUJ:[id number] DOB: 15-Nov-1936 DOA: 05/04/2013  Referring physician:  Dione Booze PCP:  Romero Belling, MD   Chief Complaint:  Nausea, vomiting, high blood sugars  HPI:  The patient is a 77 y.o. year-old male with history of T1DM with peripheral neuropathy and mild proteinuria, HTN, Depression, hypogonadism  who presents with high blood sugars and nausea.  The patient was last at their baseline health 1 week ago.  He is followed by Dr. Everardo All for his diabetes.  Due to morning hypoglycemia, he was transitioned from lantus 60 units qAM to levemir 60 units qAM 2-3 weeks ago.  He continued to have morning hypoglycemia to the 40s and 50s so 1 week ago, he was transitioned to 70/30 insulin 60 units qAM.  For the last week, his fingersticks have been in the 400s.  He has become progressively fatigued with nausea, vomiting (nonbilious, nonbloody), polyuria, polydipsia and poor appetite.  He came to the ER today because of persistently high fingersticks and worsening nausea.    He has previously been on insulin pump and SSI with meals and is familiar with insulin pens.    In the ER, his initial fingerstick was 489, bicarb 16 and anion gap of 23.  Urine was positive for glucose and ketones.  He was given 2L NS started on an insulin gtt and will be admitted for ongoing management of his DKA.    Review of Systems:  Denies fevers, chills, changes to hearing and vision.  Denies rhinorrhea, sinus congestion, sore throat.  Denies chest pain and palpitations.  Denies SOB, wheezing, cough.  Denies constipation, diarrhea.  Denies dysuria.  Denies hematemesis, blood in stools, melena, abnormal bruising or bleeding.  Denies lymphadenopathy.  Denies arthralgias, myalgias.  Denies skin rash or ulcer.  Denies lower extremity edema.  Denies focal numbness, weakness, slurred speech, confusion, facial droop.  Denies anxiety and depression.    Past Medical History   Diagnosis Date  . BENIGN PROSTATIC HYPERTROPHY 07/19/2009  . COLONIC POLYPS 02/14/2010  . DEPRESSION 03/16/2008  . DIABETES MELLITUS, TYPE I 06/30/2007  . FOLLICULITIS 07/28/2008  . GLAUCOMA 07/19/2009  . HEARING LOSS 02/14/2010  . HYPERTENSION 05/14/2008  . HYPOGONADISM, MALE 12/16/2007  . LUMBAR RADICULOPATHY, LEFT 02/23/2010  . Other osteoporosis 12/16/2007  . PERIPHERAL NEUROPATHY 06/30/2007  . PROTEINURIA, MILD 02/14/2010  . URINARY CALCULUS 12/27/2010  . Dyslipidemia   . Leukopenia   . Psoriasis    Past Surgical History  Procedure Laterality Date  . Cataract extraction  2006  . Cataract extraction  1997  . Appendectomy  1986   Social History:  reports that he has never smoked. He does not have any smokeless tobacco history on file. He reports that  drinks alcohol. He reports that he does not use illicit drugs.  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 Live at home with wife.  Drives and completes ADLs without assistance.  Does not cane or walker.  No home services.  Exercises with personal trainer at the [facility].    No Known Allergies  Family History  Problem Relation Age of Onset  . Cancer Father     Colon Cancer  . High blood pressure    . Heart disease Father      Prior to Admission medications   Medication Sig Start Date End Date Taking? Authorizing Provider  aspirin EC 81 MG tablet Take 81 mg by mouth daily.   Yes Historical Provider, MD  brimonidine-timolol (COMBIGAN) 0.2-0.5 %  ophthalmic solution Place 1 drop into both eyes every 12 (twelve) hours.   Yes Historical Provider, MD  dorzolamide (TRUSOPT) 2 % ophthalmic solution Place 1 drop into both eyes 2 (two) times daily.   Yes Historical Provider, MD  glucose blood (ONE TOUCH ULTRA TEST) test strip Use as instructed six times a day, dx 250.01, variable glucoses.   Yes Historical Provider, MD  halobetasol (ULTRAVATE) 0.05 % cream Apply 1 application topically 3 (three) times daily as needed (on neck and arms). For rash 04/27/13  04/27/14 Yes Romero Belling, MD  Insulin Aspart Prot & Aspart (NOVOLOG MIX 70/30 FLEXPEN) (70-30) 100 UNIT/ML SUPN Inject 60 Units into the skin daily with breakfast. 04/27/13  Yes Romero Belling, MD  Insulin Pen Needle (BD PEN NEEDLE NANO U/F) 32G X 4 MM MISC Use as directed four times daily  dx 250.01 07/28/12  Yes Romero Belling, MD  latanoprost (XALATAN) 0.005 % ophthalmic solution Place 1 drop into both eyes at bedtime.   Yes Historical Provider, MD  lisinopril (PRINIVIL,ZESTRIL) 20 MG tablet Take 1 tablet (20 mg total) by mouth daily. 01/19/13  Yes Romero Belling, MD  pregabalin (LYRICA) 100 MG capsule Take 1 capsule (100 mg total) by mouth 2 (two) times daily. 01/13/13  Yes Romero Belling, MD   Physical Exam: Filed Vitals:   05/04/13 0507 05/04/13 0510 05/04/13 0530 05/04/13 0630  BP:  136/69  150/58  Pulse:  79 77   Temp:  97.9 F (36.6 C)    TempSrc:  Oral    Resp:  18    Height:  6\' 1"  (1.854 m)    SpO2: 99% 99% 99%      General:  Thin AAM, no acute distress, lying on stretcher in ER with emesis bag in hand and mild perioral pallor  Eyes:  PERRL with lens replacements, anicteric, non-injected.  ENT:  Nares clear.  OP clear, non-erythematous without plaques or exudates.  MMM.  Neck:  Supple without TM or JVD.    Lymph:  No cervical, supraclavicular, or submandibular LAD.  Cardiovascular:  RRR, normal S1, S2, without m/r/g.  2+ pulses, warm extremities  Respiratory:  CTA bilaterally without increased WOB.  Abdomen:  NABS.  Soft, ND/NT.    Skin:  No rashes or focal lesions.  Musculoskeletal:  Normal bulk and tone.  No LE edema.  Psychiatric:  A & O x 4.  Appropriate affect.  Neurologic:  CN 3-12 intact.  5/5 strength.  Sensation intact.  Labs on Admission:  Basic Metabolic Panel:  Recent Labs Lab 05/04/13 0550  NA 134*  K 4.4  CL 95*  CO2 16*  GLUCOSE 472*  BUN 20  CREATININE 0.92  CALCIUM 9.1   Liver Function Tests:  Recent Labs Lab 05/04/13 0550  AST 8  ALT 7   ALKPHOS 144*  BILITOT 0.5  PROT 6.7  ALBUMIN 3.6   No results found for this basename: LIPASE, AMYLASE,  in the last 168 hours No results found for this basename: AMMONIA,  in the last 168 hours CBC:  Recent Labs Lab 05/04/13 0550  WBC 5.8  NEUTROABS 4.3  HGB 12.9*  HCT 38.6*  MCV 89.6  PLT 193   Cardiac Enzymes: No results found for this basename: CKTOTAL, CKMB, CKMBINDEX, TROPONINI,  in the last 168 hours  BNP (last 3 results) No results found for this basename: PROBNP,  in the last 8760 hours CBG:  Recent Labs Lab 05/04/13 0524 05/04/13 0749  GLUCAP 489* 396*  Radiological Exams on Admission: Dg Chest Portable 1 View  05/04/2013  *RADIOLOGY REPORT*  Clinical Data: Cough, abdominal pain, nausea, vomiting, history hypertension, diabetes  PORTABLE CHEST - 1 VIEW  Comparison: Portable exam 0808 hours compared to 03/10/2010  Findings: Mild elevation of left diaphragm. Normal heart size and pulmonary vascularity. Tortuous thoracic aorta. Lungs clear. No pleural effusion or pneumothorax. No acute osseous findings.  IMPRESSION: No acute abnormalities.   Original Report Authenticated By: Ulyses Southward, M.D.     EKG:  pending  Assessment/Plan Active Problems:   DIABETES MELLITUS, TYPE I   PERIPHERAL NEUROPATHY   GLAUCOMA   HYPERTENSION   PROTEINURIA, MILD   Psoriasis   DKA, type 1   Normocytic anemia   Active Problems:   DIABETES MELLITUS, TYPE I   PERIPHERAL NEUROPATHY   GLAUCOMA   HYPERTENSION   PROTEINURIA, MILD   Psoriasis   DKA, type 1   Normocytic anemia   Mild DKA likely triggered by change in insulin last week.  No signs of UTI or PNA or MI as source: -  Troponin pending -  q1h fingersticks -  Continue insulin gtt until bicarb >= 20 and anion gap <= 12 and fingersticks stable for at least 4 hours -  BMP 2 hours after starting insulin and q2 hours thereafter for 4 checks -  Anticipate returning to lantus with SSI -  Potassium as needed for  potassium < 4 -  Zofran prn nausea  HTN:  Blood pressure mildly elevated -   Hold ACEI due to dehydration -  Labetalol prn -  Restart ACEI tomorrow morning  Relative hyponatremia due to hyperglycemia.  Normocytic anemia, likely acute marrow suppression from hyperglycemia -  Anemia panel  Psoriasis, stable, continue steroid cream Glaucoma, stable, continue eye drops Neuropathy, stable, continue lyrica Proteinuria, stable.  Hold ACEI while dehydrated  Diet:  Diabetic diet Access:  PIV IVF:  NS at 171ml/h Proph:  lovenox  Code Status: full code Family Communication: spoke with patient and family Disposition Plan: pending resolution of DKA  Time spent: 60 min  Renae Fickle [facility] Pager [phone]  If 7PM-7AM, please contact night-coverage [email or web address] Password [facility] 05/04/2013, 8:26 AM

## 2013-05-04 NOTE — ED Provider Notes (Signed)
History     CSN: [id number]  Arrival date & time 05/04/13  0506   First MD Initiated Contact with Patient 05/04/13 [phone]      Chief Complaint  Patient presents with  . Hyperglycemia    (Consider location/radiation/quality/duration/timing/severity/associated sxs/prior treatment) The history is provided by the patient.   77 year old male with history of type 1 diabetes had his insulin changed one week ago because of problems with nocturnal hypoglycemia. Since then, his blood sugars have remained high and all through the day. He states it sugars get as high as 400-500. Over the last 2 days, he has noticed loss of energy. This morning he had an episode of nausea and vomiting. He denies chest pain, heaviness, tightness, pressure. He denies fever, chills, sweats. He denies abdominal pain or diarrhea. He denies dysuria.   Past Medical History  Diagnosis Date  . BENIGN PROSTATIC HYPERTROPHY 07/19/2009  . COLONIC POLYPS 02/14/2010  . DEPRESSION 03/16/2008  . DIABETES MELLITUS, TYPE I 06/30/2007  . FOLLICULITIS 07/28/2008  . GLAUCOMA 07/19/2009  . HEARING LOSS 02/14/2010  . HYPERTENSION 05/14/2008  . HYPOGONADISM, MALE 12/16/2007  . LUMBAR RADICULOPATHY, LEFT 02/23/2010  . Other osteoporosis 12/16/2007  . PERIPHERAL NEUROPATHY 06/30/2007  . PROTEINURIA, MILD 02/14/2010  . URINARY CALCULUS 12/27/2010  . Dyslipidemia   . Leukopenia   . Psoriasis     Past Surgical History  Procedure Laterality Date  . Cataract extraction      Family History  Problem Relation Age of Onset  . Cancer Father     Colon Cancer    History  Substance Use Topics  . Smoking status: Never Smoker   . Smokeless tobacco: Not on file  . Alcohol Use: Yes     Comment: rare      Review of Systems  All other systems reviewed and are negative.    Allergies  Review of patient's allergies indicates no known allergies.  Home Medications   Current Outpatient Rx  Name  Route  Sig  Dispense  Refill  . glucose blood  (ONE TOUCH ULTRA TEST) test strip      Use as instructed six times a day, dx 250.01, variable glucoses.         . halobetasol (ULTRAVATE) 0.05 % cream   Topical   Apply topically 3 (three) times daily as needed. For rash   50 g   1   . Insulin Aspart Prot & Aspart (NOVOLOG MIX 70/30 FLEXPEN) (70-30) 100 UNIT/ML SUPN   Subcutaneous   Inject 60 Units into the skin daily with breakfast.   10 pen   11   . Insulin Pen Needle (BD PEN NEEDLE NANO U/F) 32G X 4 MM MISC      Use as directed four times daily  dx 250.01   360 each   2   . lisinopril (PRINIVIL,ZESTRIL) 20 MG tablet   Oral   Take 1 tablet (20 mg total) by mouth daily.   30 tablet   4   . pregabalin (LYRICA) 100 MG capsule   Oral   Take 1 capsule (100 mg total) by mouth 2 (two) times daily.   60 capsule   5   . EXPIRED: sildenafil (VIAGRA) 100 MG tablet   Oral   Take 1 tablet (100 mg total) by mouth daily as needed for erectile dysfunction.   30 tablet   11   . zolpidem (AMBIEN) 5 MG tablet   Oral   Take 1 tablet (5 mg total) by  mouth at bedtime as needed for sleep.   30 tablet   2     BP 136/69  Pulse 79  Temp(Src) 97.9 F (36.6 C) (Oral)  Resp 18  Ht 6\' 1"  (1.854 m)  SpO2 99%  Physical Exam  Nursing note and vitals reviewed.  77 year old male, resting comfortably and in no acute distress. Vital signs are normal. Oxygen saturation is 99%, which is normal. Head is normocephalic and atraumatic. PERRLA, EOMI. Oropharynx is clear. Neck is nontender and supple without adenopathy or JVD. Back is nontender and there is no CVA tenderness. Lungs are clear without rales, wheezes, or rhonchi. Chest is nontender. Heart has regular rate and rhythm without murmur. Abdomen is soft, flat, nontender without masses or hepatosplenomegaly and peristalsis is normoactive. Extremities have trace edema, full range of motion is present. Skin is warm and dry without rash. Neurologic: Mental status is normal, cranial  nerves are intact, there are no motor or sensory deficits.  ED Course  Procedures (including critical care time)  Results for orders placed during the hospital encounter of 05/04/13  CBC WITH DIFFERENTIAL      Result Value Range   WBC 5.8  4.0 - 10.5 K/uL   RBC 4.31  4.22 - 5.81 MIL/uL   Hemoglobin 12.9 (*) 13.0 - 17.0 g/dL   HCT 54.0 (*) 98.1 - 19.1 %   MCV 89.6  78.0 - 100.0 fL   MCH 29.9  26.0 - 34.0 pg   MCHC 33.4  30.0 - 36.0 g/dL   RDW 47.8  29.5 - 62.1 %   Platelets 193  150 - 400 K/uL   Neutrophils Relative 74  43 - 77 %   Neutro Abs 4.3  1.7 - 7.7 K/uL   Lymphocytes Relative 16  12 - 46 %   Lymphs Abs 0.9  0.7 - 4.0 K/uL   Monocytes Relative 7  3 - 12 %   Monocytes Absolute 0.4  0.1 - 1.0 K/uL   Eosinophils Relative 2  0 - 5 %   Eosinophils Absolute 0.1  0.0 - 0.7 K/uL   Basophils Relative 1  0 - 1 %   Basophils Absolute 0.0  0.0 - 0.1 K/uL  COMPREHENSIVE METABOLIC PANEL      Result Value Range   Sodium 134 (*) 135 - 145 mEq/L   Potassium 4.4  3.5 - 5.1 mEq/L   Chloride 95 (*) 96 - 112 mEq/L   CO2 16 (*) 19 - 32 mEq/L   Glucose, Bld 472 (*) 70 - 99 mg/dL   BUN 20  6 - 23 mg/dL   Creatinine, Ser 3.08  0.50 - 1.35 mg/dL   Calcium 9.1  8.4 - 65.7 mg/dL   Total Protein 6.7  6.0 - 8.3 g/dL   Albumin 3.6  3.5 - 5.2 g/dL   AST 8  0 - 37 U/L   ALT 7  0 - 53 U/L   Alkaline Phosphatase 144 (*) 39 - 117 U/L   Total Bilirubin 0.5  0.3 - 1.2 mg/dL   GFR calc non Af Amer 80 (*) >90 mL/min   GFR calc Af Amer >90  >90 mL/min  URINALYSIS, ROUTINE W REFLEX MICROSCOPIC      Result Value Range   Color, Urine YELLOW  YELLOW   APPearance CLEAR  CLEAR   Specific Gravity, Urine 1.035 (*) 1.005 - 1.030   pH 5.0  5.0 - 8.0   Glucose, UA >1000 (*) NEGATIVE mg/dL  Hgb urine dipstick NEGATIVE  NEGATIVE   Bilirubin Urine NEGATIVE  NEGATIVE   Ketones, ur >80 (*) NEGATIVE mg/dL   Protein, ur NEGATIVE  NEGATIVE mg/dL   Urobilinogen, UA 0.2  0.0 - 1.0 mg/dL   Nitrite NEGATIVE   NEGATIVE   Leukocytes, UA NEGATIVE  NEGATIVE  GLUCOSE, CAPILLARY      Result Value Range   Glucose-Capillary 489 (*) 70 - 99 mg/dL  URINE MICROSCOPIC-ADD ON      Result Value Range   Squamous Epithelial / LPF RARE  RARE   Bacteria, UA RARE  RARE     1. DKA (diabetic ketoacidoses)     CRITICAL CARE Performed by: WGNFA,OZHYQ Total critical care time: 50 minutes Critical care time was exclusive of separately billable procedures and treating other patients. Critical care was necessary to treat or prevent imminent or life-threatening deterioration. Critical care was time spent personally by me on the following activities: development of treatment plan with patient and/or surrogate as well as nursing, discussions with consultants, evaluation of patient's response to treatment, examination of patient, obtaining history from patient or surrogate, ordering and performing treatments and interventions, ordering and review of laboratory studies, ordering and review of radiographic studies, pulse oximetry and re-evaluation of patient's condition.    MDM  Hyperglycemia. Review of old records shows that he has never had ketoacidosis and he does not appear to have ketoacidosis clinically today. Blood sugar in the ED his 489. He started with IV fluids and likely will need supplemental insulin and he'll need to have his insulin regimen adjusted.  Laboratory workup shows that he is actually in ketoacidosis with low CO2 of 16 and elevated anion gap of 23. Chest x-ray, ECG, lipase, and troponin have been ordered to look for possible underlying conditions that may have triggered your ketoacidosis. He is placed on an insulin drip and case is discussed with Dr. Malachi Bonds of [facility] who agrees to admit him to the step down bed.  Dione Booze, MD 05/07/13 [phone]

## 2013-05-05 ENCOUNTER — Telehealth: Payer: Self-pay | Admitting: Endocrinology

## 2013-05-05 LAB — IRON AND TIBC
Iron: 58 ug/dL (ref 42–135)
Saturation Ratios: 21 % (ref 20–55)
UIBC: 217 ug/dL (ref 125–400)

## 2013-05-05 LAB — VITAMIN B12: Vitamin B-12: 1030 pg/mL — ABNORMAL HIGH (ref 211–911)

## 2013-05-05 LAB — CBC
HCT: 37.2 % — ABNORMAL LOW (ref 39.0–52.0)
Hemoglobin: 12.7 g/dL — ABNORMAL LOW (ref 13.0–17.0)
MCHC: 34.1 g/dL (ref 30.0–36.0)

## 2013-05-05 LAB — GLUCOSE, CAPILLARY: Glucose-Capillary: 286 mg/dL — ABNORMAL HIGH (ref 70–99)

## 2013-05-05 LAB — BASIC METABOLIC PANEL
BUN: 11 mg/dL (ref 6–23)
Chloride: 103 mEq/L (ref 96–112)
GFR calc Af Amer: >90 mL/min (ref >90)
Glucose, Bld: 200 mg/dL — ABNORMAL HIGH (ref 70–99)
Potassium: 3.7 mEq/L (ref 3.5–5.1)

## 2013-05-05 LAB — HEMOGLOBIN A1C: Mean Plasma Glucose: 286 mg/dL — ABNORMAL HIGH (ref <117)

## 2013-05-05 MED ORDER — INSULIN ASPART 100 UNIT/ML FLEXPEN: 0.0000 [IU] | PEN_INJECTOR | Freq: Three times a day (TID) | SUBCUTANEOUS | Status: DC

## 2013-05-05 MED ORDER — INSULIN GLARGINE 100 UNIT/ML SOLOSTAR PEN: 50.0000 [IU] | PEN_INJECTOR | Freq: Every day | SUBCUTANEOUS | Status: DC

## 2013-05-05 MED ORDER — INSULIN GLARGINE 100 UNIT/ML ~~LOC~~ SOLN
50.0000 [IU] | Freq: Every day | SUBCUTANEOUS | Status: DC
  Filled 2013-05-05: qty 0.5

## 2013-05-05 NOTE — Telephone Encounter (Signed)
appt scheduled

## 2013-05-05 NOTE — Discharge Summary (Signed)
Physician Discharge Summary  Dylan Quinn ZOX:[id number] DOB: 09-21-1936 DOA: 05/04/2013  PCP: Dylan Belling, MD  Admit date: 05/04/2013 Discharge date: 05/05/2013  Recommendations for Outpatient Follow-up:  1. Follow up with Dr. Everardo Quinn within next 2-3 weeks to review fingersticks and adjust insulin  Discharge Diagnoses:  Active Problems:   DIABETES MELLITUS, TYPE I   PERIPHERAL NEUROPATHY   GLAUCOMA   HYPERTENSION   PROTEINURIA, MILD   Psoriasis   DKA, type 1   Normocytic anemia   Discharge Condition: stable, improved  Diet recommendation: diabetic diet  Wt Readings from Last 3 Encounters:  05/04/13 92 kg (202 lb 13.2 oz)  04/27/13 97.07 kg (214 lb)  03/31/13 94.802 kg (209 lb)    History of present illness:   The patient is a 77 y.o. year-old male with history of T1DM with peripheral neuropathy and mild proteinuria, HTN, Depression, hypogonadism who presents with high blood sugars and nausea. The patient was last at their baseline health 1 week ago. He is followed by Dr. Everardo Quinn for his diabetes. Due to morning hypoglycemia, he was transitioned from lantus 60 units qAM to levemir 60 units qAM 2-3 weeks ago. He continued to have morning hypoglycemia to the 40s and 50s so 1 week ago, he was transitioned to 70/30 insulin 60 units qAM. For the last week, his fingersticks have been in the 400s. He has become progressively fatigued with nausea, vomiting (nonbilious, nonbloody), polyuria, polydipsia and poor appetite. He came to the ER today because of persistently high fingersticks and worsening nausea. He has previously been on insulin pump and SSI with meals and is familiar with insulin pens.  In the ER, his initial fingerstick was 489, bicarb 16 and anion gap of 23. Urine was positive for glucose and ketones. He was given 2L NS started on an insulin gtt and will be admitted for ongoing management of his DKA.   He may continue his other medications as before for his stable other  chronic medical conditions.     Hospital Course:   Mr. Dylan Quinn was started on insulin gtt on the morning of 5/12 and was transitioned to subcutaneous insulin lantus 45 units the same evening after his gap had closed and his bicarbonate was greater than 20.  His gap remained closed and his fingerstick in the morning was 200.  Recommend that he increase his lantus to 50 units and start sliding scale insulin with meals.  He should record his fingersticks ACHS and bring them with him to his appointment with Dr. Everardo Quinn in a few weeks.  I believe that he was not getting 24 hours of long acting insulin coverage with the once daily 70/30 injections and it is easier to titrate lantus + Dylan Quinn acting insulin which the patient feels comfortable doing.    Procedures:  Insulin gtt  Consultations:  none  Discharge Exam: Filed Vitals:   05/05/13 0600  BP: 158/88  Pulse: 80  Temp:   Resp: 14   Filed Vitals:   05/05/13 0300 05/05/13 0400 05/05/13 0500 05/05/13 0600  BP:  159/84  158/88  Pulse: 77 77 77 80  Temp:  98.1 F (36.7 C)    TempSrc:  Oral    Resp: 14 11 15 14   Height:      Weight:      SpO2: 95% 97% 96% 94%    General: Thin AAM, no acute distress, lying in bed HEENT:  NCAT, MMM Cardiovascular: RRR, no mrg, 2+ pulses warm extremities Respiratory: CTAB ABD:  NABS, soft, ND/NT MSK:  No LEE  Discharge Instructions      Discharge Orders   Future Appointments Provider Department Dept Phone   05/29/2013 2:15 PM Dylan Belling, MD [facility] [phone]   Future Orders Complete By Expires     Call MD for:  difficulty breathing, headache or visual disturbances  As directed     Call MD for:  extreme fatigue  As directed     Call MD for:  hives  As directed     Call MD for:  persistant dizziness or light-headedness  As directed     Call MD for:  persistant nausea and vomiting  As directed     Call MD for:  severe uncontrolled pain  As directed     Call MD  for:  temperature >100.4  As directed     Diet Carb Modified  As directed     Discharge instructions  As directed     Comments:      You were hospitalized with diabetic ketoacidosis, probably because the longer acting insulin in 70/30 did not last 24 hours and you did not have insulin in your body for several hours during the day.  Please restart lantus, but at a lower dose and use sliding scale Dylan Quinn acting insulin with meals.  Please dose your lantus in the evenings instead of the mornings for now because we had to give you your shot in the evening when we transitioned you off of the insulin gtt.  Please follow the instructions for hypoglycemia and call Dr. George Quinn office if you have hypoglycemia.  Please call his office if your fingersticks are > 350.  Please STOP your 70/30 insulin and make sure you check your fingersticks before breakfast, lunch, dinner, and bedtime and record them to take with you to Dr. George Quinn office.    Increase activity slowly  As directed         Medication List    STOP taking these medications       Insulin Aspart Prot & Aspart (70-30) 100 UNIT/ML Supn  Commonly known as:  NOVOLOG MIX 70/30 FLEXPEN      TAKE these medications       aspirin EC 81 MG tablet  Take 81 mg by mouth daily.     COMBIGAN 0.2-0.5 % ophthalmic solution  Generic drug:  brimonidine-timolol  Place 1 drop into both eyes every 12 (twelve) hours.     dorzolamide 2 % ophthalmic solution  Commonly known as:  TRUSOPT  Place 1 drop into both eyes 2 (two) times daily.     halobetasol 0.05 % cream  Commonly known as:  ULTRAVATE  Apply 1 application topically 3 (three) times daily as needed (on neck and arms). For rash     insulin aspart 100 unit/mL Soln FlexPen  Commonly known as:  NOVOLOG FLEXPEN  Inject 0-15 Units into the skin 3 (three) times daily with meals. Per sliding scale.  Type 1 IDDM, DKA 250.00     Insulin Glargine 100 UNIT/ML Sopn  Commonly known as:  LANTUS SOLOSTAR   Inject 50 Units into the skin at bedtime.     Insulin Pen Needle 32G X 4 MM Misc  Commonly known as:  BD PEN NEEDLE NANO U/F  Use as directed four times daily  dx 250.01     latanoprost 0.005 % ophthalmic solution  Commonly known as:  XALATAN  Place 1 drop into both eyes at bedtime.  lisinopril 20 MG tablet  Commonly known as:  PRINIVIL,ZESTRIL  Take 1 tablet (20 mg total) by mouth daily.     ONE TOUCH ULTRA TEST test strip  Generic drug:  glucose blood  Use as instructed six times a day, dx 250.01, variable glucoses.     pregabalin 100 MG capsule  Commonly known as:  LYRICA  Take 1 capsule (100 mg total) by mouth 2 (two) times daily.       Follow-up Information   Follow up with Dylan Belling, MD. (already scheduled appointment)    Contact information:   [address] [place] [address] [phone]       Follow up with primary care.   Contact information:   You may call [phone] for information about primary care doctors.         The results of significant diagnostics from this hospitalization (including imaging, microbiology, ancillary and laboratory) are listed below for reference.    Significant Diagnostic Studies: Dg Chest Portable 1 View  05/04/2013  *RADIOLOGY REPORT*  Clinical Data: Cough, abdominal pain, nausea, vomiting, history hypertension, diabetes  PORTABLE CHEST - 1 VIEW  Comparison: Portable exam 0808 hours compared to 03/10/2010  Findings: Mild elevation of left diaphragm. Normal heart size and pulmonary vascularity. Tortuous thoracic aorta. Lungs clear. No pleural effusion or pneumothorax. No acute osseous findings.  IMPRESSION: No acute abnormalities.   Original Report Authenticated By: Ulyses Southward, M.D.     Microbiology: Recent Results (from the past 240 hour(s))  MRSA PCR SCREENING     Status: None   Collection Time    05/04/13 10:05 AM      Result Value Range Status   MRSA by PCR NEGATIVE  NEGATIVE Final   Comment:             The GeneXpert MRSA Assay (FDA     approved for NASAL specimens     only), is one component of a     comprehensive MRSA colonization     surveillance program. It is not     intended to diagnose MRSA     infection nor to guide or     monitor treatment for     MRSA infections.     Labs: Basic Metabolic Panel:  Recent Labs Lab 05/04/13 1030 05/04/13 1313 05/04/13 1527 05/04/13 2133 05/05/13 0341  NA 138 137 136 137 137  K 4.3 4.3 4.7 3.9 3.7  CL 101 104 105 104 103  CO2 16* 18* 21 21 22   GLUCOSE 319* 212* 185* 183* 200*  BUN 17 15 14 14 11   CREATININE 0.86 0.83 0.82 0.76 0.73  CALCIUM 9.3 8.9 9.0 9.1 9.0   Liver Function Tests:  Recent Labs Lab 05/04/13 0550  AST 8  ALT 7  ALKPHOS 144*  BILITOT 0.5  PROT 6.7  ALBUMIN 3.6    Recent Labs Lab 05/04/13 0843  LIPASE 17   No results found for this basename: AMMONIA,  in the last 168 hours CBC:  Recent Labs Lab 05/04/13 0550 05/04/13 1030 05/05/13 0341  WBC 5.8 9.3 5.8  NEUTROABS 4.3  --   --   HGB 12.9* 13.8 12.7*  HCT 38.6* 40.2 37.2*  MCV 89.6 90.3 88.4  PLT 193 234 227   Cardiac Enzymes:  Recent Labs Lab 05/04/13 0843  TROPONINI <0.30   BNP: BNP (last 3 results) No results found for this basename: PROBNP,  in the last 8760 hours CBG:  Recent Labs Lab 05/04/13 1740  05/04/13 1839 05/04/13 1936 05/04/13 2043 05/04/13 2258  GLUCAP 149* 213* 184* 170* 149*    Time coordinating discharge: 45 minutes  Signed:  Radley Barto  [facility] 05/05/2013, 7:44 AM

## 2013-05-05 NOTE — Telephone Encounter (Signed)
please call patient: He needs ov this week

## 2013-05-05 NOTE — Telephone Encounter (Signed)
He was discharged today

## 2013-05-05 NOTE — Telephone Encounter (Signed)
Patient is in ICU at [facility]

## 2013-05-05 NOTE — Progress Notes (Signed)
Dr. Malachi Bonds made aware of patient's elevated BP.  Patient to discharge home.  Kinnie Feil, RN

## 2013-05-06 ENCOUNTER — Ambulatory Visit (INDEPENDENT_AMBULATORY_CARE_PROVIDER_SITE_OTHER): Payer: Medicare Other | Admitting: Endocrinology

## 2013-05-06 ENCOUNTER — Encounter: Payer: Self-pay | Admitting: Endocrinology

## 2013-05-06 VITALS — BP 126/80 | HR 80 | Ht 73.0 in | Wt 208.0 lb

## 2013-05-06 DIAGNOSIS — E109 Type 1 diabetes mellitus without complications: Secondary | ICD-10-CM

## 2013-05-06 NOTE — Patient Instructions (Addendum)
Starting tomorrow morning, go back to the novolog 70/30, 75 units each morning.   Please come back for a follow-up appointment in 5 days.   check your blood sugar 4 times a day--before the 3 meals, and at bedtime.  also check if you have symptoms of your blood sugar being too high or too low.  please keep a record of the readings and bring it to your next appointment here.  please call us sooner if your blood sugar goes below 70, or if you have a lot of readings over 200.   It is dangerous for you to make the trip to [place] tomorrow.

## 2013-05-06 NOTE — Progress Notes (Signed)
Subjective:    Patient ID: Dylan Quinn, male    DOB: 26-Mar-1936, 77 y.o.   MRN: [id number]  HPI Pt returns for f/u of type 1 dm (dx'ed 1997; complicated by retinopathy, peripheral sensory neuropathy, and nephropathy; he has never had severe hypoglycemia).  He had cbg's last week in the 400's, but did not call us.  He was then hospitalized for hyperglycemia and mild DKA.  no cbg record, but states cbg's are in the 200's.  He is planning to go to [place] tomorrow for a funeral.  i advised him it is dangerous for him to go, but he says he will go anyway.   Past Medical History  Diagnosis Date  . BENIGN PROSTATIC HYPERTROPHY 07/19/2009  . COLONIC POLYPS 02/14/2010  . DEPRESSION 03/16/2008  . DIABETES MELLITUS, TYPE I 06/30/2007  . FOLLICULITIS 07/28/2008  . GLAUCOMA 07/19/2009  . HEARING LOSS 02/14/2010  . HYPERTENSION 05/14/2008  . HYPOGONADISM, MALE 12/16/2007  . LUMBAR RADICULOPATHY, LEFT 02/23/2010  . Other osteoporosis 12/16/2007  . PERIPHERAL NEUROPATHY 06/30/2007  . PROTEINURIA, MILD 02/14/2010  . URINARY CALCULUS 12/27/2010  . Dyslipidemia   . Leukopenia   . Psoriasis     Past Surgical History  Procedure Laterality Date  . Cataract extraction  2006  . Cataract extraction  1997  . Appendectomy  1986    History   Social History  . Marital Status: Married    Spouse Name: N/A    Number of Children: 2  . Years of Education: N/A   Occupational History  . Retired    Social History Main Topics  . Smoking status: Never Smoker   . Smokeless tobacco: Never Used  . Alcohol Use: Yes     Comment: rare  . Drug Use: No  . Sexually Active: No   Other Topics Concern  . 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 Live at home with wife.  Drives and completes ADLs without assistance.  Does not cane or walker.  No home services.  Exercises with personal trainer at the [facility].      Current Outpatient Prescriptions on File Prior to Visit  Medication Sig  Dispense Refill  . aspirin EC 81 MG tablet Take 81 mg by mouth daily.      . brimonidine-timolol (COMBIGAN) 0.2-0.5 % ophthalmic solution Place 1 drop into both eyes every 12 (twelve) hours.      . dorzolamide (TRUSOPT) 2 % ophthalmic solution Place 1 drop into both eyes 2 (two) times daily.      Marland Kitchen glucose blood (ONE TOUCH ULTRA TEST) test strip Use as instructed six times a day, dx 250.01, variable glucoses.      . halobetasol (ULTRAVATE) 0.05 % cream Apply 1 application topically 3 (three) times daily as needed (on neck and arms). For rash      . Insulin Pen Needle (BD PEN NEEDLE NANO U/F) 32G X 4 MM MISC Use as directed four times daily  dx 250.01  360 each  2  . latanoprost (XALATAN) 0.005 % ophthalmic solution Place 1 drop into both eyes at bedtime.      Marland Kitchen lisinopril (PRINIVIL,ZESTRIL) 20 MG tablet Take 1 tablet (20 mg total) by mouth daily.  30 tablet  4  . pregabalin (LYRICA) 100 MG capsule Take 1 capsule (100 mg total) by mouth 2 (two) times daily.  60 capsule  5   No current facility-administered medications on file prior to visit.  No Known Allergies  Family History  Problem Relation Age of Onset  . Cancer Father     Colon Cancer  . High blood pressure    . Heart disease Father    BP 126/80  Pulse 80  Ht 6\' 1"  (1.854 m)  Wt 208 lb (94.348 kg)  BMI 27.45 kg/m2  SpO2 98%  Review of Systems Denies weight change.      Objective:   Physical Exam VITAL SIGNS:  See vs page GENERAL: no distress Gait: normal and steady.   Ext: no edema.     Assessment & Plan:  DM: We can't go back to lantus, as he had am hypoglycemia on this.

## 2013-05-15 ENCOUNTER — Other Ambulatory Visit: Payer: Self-pay

## 2013-05-19 ENCOUNTER — Telehealth: Payer: Self-pay

## 2013-05-19 NOTE — Telephone Encounter (Signed)
Left message for pt , please schedule

## 2013-05-19 NOTE — Telephone Encounter (Signed)
Pt states his cbg, is doing ok, and he is not happy with the regimen he has been on, nothing is working, pt's wife got back on the phone and states pt needs to get cbg regulated, readings have been 300's and 400's at qhs but he can not take insulin at hs because his cbg will drop in the 40's, pt would like to see Dr. Elvera Lennox, please advise.

## 2013-05-19 NOTE — Telephone Encounter (Signed)
Pt's wife called, wanted to know if you would let patient get a 2nd opinion from Dr. Elvera Lennox, pt has been in the hospital, please advise

## 2013-05-19 NOTE — Telephone Encounter (Signed)
Ok to change to dr Elvera Lennox, but you will need to get a new pcp, as she does not do this.

## 2013-05-19 NOTE — Telephone Encounter (Signed)
How is your blood sugar doing now?

## 2013-05-22 ENCOUNTER — Telehealth: Payer: Self-pay | Admitting: Endocrinology

## 2013-05-22 NOTE — Telephone Encounter (Signed)
LMOM

## 2013-05-22 NOTE — Telephone Encounter (Signed)
Pt wants to switch PCP from Dr. Everardo All to Dr. Yetta Barre.  Pt has Medicare.

## 2013-05-22 NOTE — Telephone Encounter (Signed)
ok 

## 2013-05-28 NOTE — Telephone Encounter (Signed)
LMOM to call for an appt. °

## 2013-05-29 ENCOUNTER — Ambulatory Visit: Payer: Medicare Other | Admitting: Endocrinology

## 2013-06-01 ENCOUNTER — Encounter: Payer: Self-pay | Admitting: Internal Medicine

## 2013-06-01 ENCOUNTER — Ambulatory Visit (INDEPENDENT_AMBULATORY_CARE_PROVIDER_SITE_OTHER): Payer: Medicare Other | Admitting: Internal Medicine

## 2013-06-01 VITALS — HR 73 | Temp 98.0°F | Resp 12 | Ht 73.0 in | Wt 219.0 lb

## 2013-06-01 DIAGNOSIS — E109 Type 1 diabetes mellitus without complications: Secondary | ICD-10-CM

## 2013-06-01 MED ORDER — INSULIN ASPART 100 UNIT/ML ~~LOC~~ SOLN: SUBCUTANEOUS | Status: DC

## 2013-06-01 MED ORDER — INSULIN GLARGINE 100 UNIT/ML SOLOSTAR PEN: 25.0000 [IU] | PEN_INJECTOR | Freq: Every day | SUBCUTANEOUS | Status: DC

## 2013-06-01 NOTE — Patient Instructions (Addendum)
Please decrease the Lantus dose to 25 units. Start Novolog insulin 15 minutes before your main meals as follows: - normal meal: 6 units - large meal: 8 units Add the following sliding scale to the above regimen: - 150-175: + 1 unit - 176-200: + 2 units - 201-225: + 3 units - 226-250: + 4 units - 251-275: + 5 units - 276-300 : + 6 units - >301: + 7 units Please do not inject the sliding scale at bedtime, unless >300, and then inject half of the above doses. Please join MyChart and send me sugars in 1 week. Please return in 2 weeks with your sugar log.  Check sugars often in the next few days.  Call me if >300 or <90 consistently.

## 2013-06-01 NOTE — Progress Notes (Signed)
Patient ID: Dylan Quinn, male   DOB: 03-27-1936, 77 y.o.   MRN: [id number]  HPI: Dylan Quinn is a 77 y.o.-year-old male, referred by his PCP, Dr Yetta Barre, for management of DM2, insulin-dependent, uncontrolled, with complications (severe hypoglycemia, widely fluctuating sugars, recent admission for DKA, diabetic retinopathy, CKD, peripheral neuropathy, mild proteinuria). He is here with his wife, who is also a patient of mine, and who offers a part of the history  Patient has been diagnosed with diabetes in 2007; he has not been started on insulin in 1998. He was on insulin pump from 2005-2009. Last hemoglobin A1c was: Lab Results  Component Value Date   HGBA1C 11.6* 05/05/2013  previously 12.7%, while a year ago 10.6%.  Patient was previously seen by Dr. Everardo All, at last visit in 03/2013, he was switched from Lantus 60 units in the morning,to NovoLog 70/30 60 units in a.m., as he was having hypoglycemia 40-50. On this regimen, his sugar started to increase to 400, with progressive nausea/vomiting/abdominal pain/polyuria/polydipsia. With these symptoms he presented to the hospital and he was admitted with a CBG of 489, bicarbonate 16, anion gap of 23. His urine was positive for glucose and ketones. He was switched back to Lantus 40 units each bedtime and NovoLog 0-15 units per sliding scale in the hospital.   Pt is on a regimen of: - Lantus 40 units qhs - Novolog SSI tid ac: target 120,  - 121-150: 2 units - 151-200: 3 units - 201-250: 5 units Etc. until - >400: 15 units and call office  Pt checks his sugars 3x a day and they are: - am: 39-400 - There is just a day 200s to 550 - He also checks in the middle of the night between 2:48 AM and his sugars are usually in the 300s or 400, but then drops precipitously to 40 for her 50s at 9-10 AM when he wakes up. Has frequent lows. Lowest sugar was 39 this am (10 am);  he has hypoglycemia awareness at 75. Highest sugar was HI. He can drop from  200 to 100 if works outside in the garden.   Pt's meals are: - Breakfast: corn flakes, glass of milk, small glass of OJ; or  bacon, eggs, toast, grits - Lunch: 4 pm - burger, sandwich with lettuce tomato, cheese, sweet tea - Dinner: meat + broccoli/green beans + rice or mashed potatoes, [place] food 1-2 a month, chinese food  - Snacks: cheetos, chips and salsa, occasionally Crispy cream   Pt does not have chronic kidney disease, last BUN/creatinine was:  Lab Results  Component Value Date   BUN 11 05/05/2013   CREATININE 0.73 05/05/2013  Last microalbumin/creatinine ratio was 5.4 on 01/13/2013. He is on Lisinopril.  Last set of lipids: Lab Results  Component Value Date   CHOL 138 01/13/2013   HDL 53.90 01/13/2013   LDLCALC 65 01/13/2013   TRIG 96.0 01/13/2013   CHOLHDL 3 01/13/2013   Pt's last eye exam was in 01/2013 (Dr. Elmer Picker). + DR - will have Laser Sx on both eyes. Denies numbness and tingling in his legs.  PMH: I reviewed his chart and he also has a history of anemia, hypogonadism, glaucoma, hypoacusis, BPH, hypertension, osteoporosis, lumbar radiculopathy, history of urinary calculus, psoriasis.  Pt has FH of DM on mother's side of family.  ROS: Constitutional: no weight gain/loss, + fatigue, no subjective hyperthermia/hypothermia, + nocturia Eyes: no blurry vision, no xerophthalmia ENT: no sore throat, no nodules palpated in throat, no  dysphagia/odynophagia, no hoarseness Cardiovascular: no CP/SOB/palpitations/+ leg swelling Respiratory: no cough/SOB Gastrointestinal: no N/V/D/C Musculoskeletal: no muscle/joint aches Skin: + rash on left arm Neurological: no tremors/numbness/tingling/dizziness Psychiatric: no depression/anxiety Low libido, difficulty with erections  Past Surgical History  Procedure Laterality Date  . Cataract extraction  2006  . Cataract extraction  1997  . Appendectomy  1986   History   Social History  . Marital Status: Married    Spouse Name:  N/A    Number of Children: 2   Occupational History  . Retired    Social History Main Topics  . Smoking status: Never Smoker   . Smokeless tobacco: Never Used  . Alcohol Use: Yes     Comment: rare  . 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 Live at home with wife.  Drives and completes ADLs without assistance.  Does not cane or walker.  No home services.  Exercises with personal trainer at the [facility].     Current Outpatient Prescriptions on File Prior to Visit  Medication Sig Dispense Refill  . brimonidine-timolol (COMBIGAN) 0.2-0.5 % ophthalmic solution Place 1 drop into both eyes every 12 (twelve) hours.      . dorzolamide (TRUSOPT) 2 % ophthalmic solution Place 1 drop into both eyes 2 (two) times daily.      Marland Kitchen glucose blood (ONE TOUCH ULTRA TEST) test strip Use as instructed six times a day, dx 250.01, variable glucoses.      . halobetasol (ULTRAVATE) 0.05 % cream Apply 1 application topically 3 (three) times daily as needed (on neck and arms). For rash      . Insulin Pen Needle (BD PEN NEEDLE NANO U/F) 32G X 4 MM MISC Use as directed four times daily  dx 250.01  360 each  2  . latanoprost (XALATAN) 0.005 % ophthalmic solution Place 1 drop into both eyes at bedtime.      Marland Kitchen lisinopril (PRINIVIL,ZESTRIL) 20 MG tablet Take 1 tablet (20 mg total) by mouth daily.  30 tablet  4  . pregabalin (LYRICA) 100 MG capsule Take 1 capsule (100 mg total) by mouth 2 (two) times daily.  60 capsule  5  . aspirin EC 81 MG tablet Take 81 mg by mouth daily.       No current facility-administered medications on file prior to visit.   No Known Allergies  Family History  Problem Relation Age of Onset  . Cancer Father     Colon Cancer  . High blood pressure    . Heart disease Father    PE: Pulse 73  Temp(Src) 98 F (36.7 C) (Oral)  Resp 12  Wt 219 lb (99.338 kg)  BMI 28.9 kg/m2  SpO2 98% Wt Readings from Last 3 Encounters:  06/01/13 219 lb (99.338 kg)   05/06/13 208 lb (94.348 kg)  05/04/13 202 lb 13.2 oz (92 kg)   Constitutional: overweight, in NAD Eyes: PERRLA, EOMI, no exophthalmos ENT: moist mucous membranes, no thyromegaly, no cervical lymphadenopathy Cardiovascular: RRR, No MRG Respiratory: CTA B Gastrointestinal: abdomen soft, NT, ND, BS+ Musculoskeletal: no deformities, strength intact in all 4 Skin: moist, warm, faint rash on left forearm Neurological: no tremor with outstretched hands, DTR normal in all 4  ASSESSMENT: 1. DM2, insulin-dependent, uncontrolled, with complications - diabetic retinopathy - CKD, mild proteinuria - peripheral neuropathy-on Lyrica  PLAN:  1. Pt with brittle DM - most likely LADA (based on his wide sugar fluctuations, his body habitus, increase  sensitivity to insulin, development of DKA if not covered with insulin for 24 hours). He tells me that his sugars was never well controlled, and he would be very surprised to see his sugars coming lower than 200-300s, without lows. - patient has been on a high dose of Lantus (60 units) which was dropping his sugars into the 40s in the morning, and then switched to 70/30 once a day in a.m., which actually was causing more highs, to the point of development of DKA. After discharge from the hospital, his Lantus was decreased to 40 units, which is dropping his sugars from for example 475 at 1 AM to 39 at 10 AM last night, or from 547 at 12 AM to 47 at 10 AM (at this point patient mentions that he did get 5 units of NovoLog before bedtime for his high sugar). Since it appears that patient is getting still more Lantus then needed, I will decrease the dose. - regarding his NovoLog insulin, he is only using the sliding scale right now, and I think he needs to mealtime insulin + a corrective scale. - I advised him to do the following: Please decrease the Lantus dose to 25 units. Start Novolog insulin 15 minutes before your main meals as follows: - normal meal: 6 units -  large meal: 8 units Add the following sliding scale to the above regimen: - 150-175: + 1 unit - 176-200: + 2 units - 201-225: + 3 units - 226-250: + 4 units - 251-275: + 5 units - 276-300 : + 6 units - >301: + 7 units Please do not inject the sliding scale at bedtime, unless >300, and then inject half of the above doses. Please join MyChart and send me sugars in 1 week. Please return in 2 weeks with your sugar log.  Check sugars often in the next few days.  Call me if >300 or <90 consistently. - we did several exercises, and patient appears to have understood the concept and to calculate the insulin dose correctly - I advised him to decrease the dose of NovoLog with meals preceding a period of exertion, for example when he works in his yard, or when he exercises - For now, I advised him not to bolus for snacks, however he can have for example for [employer] doughnuts in one sitting, and this will obviously be a problem... - I explained that our main goal for now is to avoid his lows, but I anticipate for him to have some highs, on which we'll start working from now on - advised him to send me sugars in a week and I will see him back in 2 weeks - given sugar log and advised how to fill it and to bring it at next appt - Check sugars 3-4 times a day before meals and at bedtime - given foot care handout and explained the principles - given instructions for hypoglycemia management "15-15 rule"  Time spent with the patient: 1 hour, of which >50% was spent in obtaining information about his history, reviewing his previous labs, evaluations, and treatments, counseling him about his brittle diabetes (please see the discussed topics above), developing a plan for treatment, discussing this with the patient and his wife, and answering their questions.

## 2013-06-03 ENCOUNTER — Encounter: Payer: Self-pay | Admitting: Internal Medicine

## 2013-06-03 ENCOUNTER — Ambulatory Visit (INDEPENDENT_AMBULATORY_CARE_PROVIDER_SITE_OTHER): Payer: Medicare Other | Admitting: Internal Medicine

## 2013-06-03 VITALS — BP 146/86 | HR 66 | Temp 97.5°F | Resp 16 | Ht 73.0 in | Wt 219.0 lb

## 2013-06-03 DIAGNOSIS — I10 Essential (primary) hypertension: Secondary | ICD-10-CM

## 2013-06-03 DIAGNOSIS — R809 Proteinuria, unspecified: Secondary | ICD-10-CM

## 2013-06-03 DIAGNOSIS — E109 Type 1 diabetes mellitus without complications: Secondary | ICD-10-CM

## 2013-06-03 DIAGNOSIS — Z23 Encounter for immunization: Secondary | ICD-10-CM

## 2013-06-03 DIAGNOSIS — L500 Allergic urticaria: Secondary | ICD-10-CM

## 2013-06-03 MED ORDER — OLMESARTAN MEDOXOMIL 40 MG PO TABS: 40.0000 mg | ORAL_TABLET | Freq: Every day | ORAL | Status: DC

## 2013-06-03 MED ORDER — PNEUMOCOCCAL 13-VAL CONJ VACC IM SUSP: 0.5000 mL | INTRAMUSCULAR | Status: AC

## 2013-06-03 MED ORDER — TETANUS-DIPHTH-ACELL PERTUSSIS 5-2.5-18.5 LF-MCG/0.5 IM SUSP
0.5000 mL | Freq: Once | INTRAMUSCULAR | Status: AC
  Administered 2013-06-03: 0.5 mL via INTRAMUSCULAR

## 2013-06-03 MED ORDER — PNEUMOCOCCAL VAC POLYVALENT 25 MCG/0.5ML IJ INJ
0.5000 mL | INJECTION | Freq: Once | INTRAMUSCULAR | Status: AC
  Administered 2013-06-03: 0.5 mL via INTRAMUSCULAR

## 2013-06-03 NOTE — Patient Instructions (Signed)

## 2013-06-03 NOTE — Progress Notes (Signed)
Subjective:    Patient ID: Dylan Quinn, male    DOB: [birth year], 77 y.o.   MRN: [id number]  Rash This is a new problem. The current episode started 1 to 4 weeks ago. The problem is unchanged. The affected locations include the left arm, right arm, torso and abdomen. The rash is characterized by swelling and itchiness. Associated with: lisinopril. Pertinent negatives include no anorexia, congestion, cough, diarrhea, eye pain, facial edema, fatigue, fever, joint pain, nail changes, rhinorrhea, shortness of breath, sore throat or vomiting. Past treatments include topical steroids. The treatment provided no relief. There is no history of allergies, asthma, eczema or varicella.      Review of Systems  Constitutional: Negative.  Negative for fever, chills, diaphoresis, activity change, appetite change, fatigue and unexpected weight change.  HENT: Negative.  Negative for congestion, sore throat, facial swelling and rhinorrhea.   Eyes: Negative.  Negative for pain.  Respiratory: Negative.  Negative for cough, choking, shortness of breath, wheezing and stridor.   Cardiovascular: Negative.  Negative for chest pain, palpitations and leg swelling.  Gastrointestinal: Negative.  Negative for vomiting, abdominal pain, diarrhea and anorexia.  Endocrine: Negative.   Genitourinary: Negative.   Musculoskeletal: Negative.  Negative for joint pain.  Skin: Positive for rash. Negative for nail changes, pallor and wound.  Allergic/Immunologic: Negative.  Negative for environmental allergies, food allergies and immunocompromised state.  Neurological: Negative.  Negative for dizziness, weakness, light-headedness and numbness.  Hematological: Negative.  Negative for adenopathy. Does not bruise/bleed easily.  Psychiatric/Behavioral: Negative.        Objective:   Physical Exam  Vitals reviewed. Constitutional: He is oriented to person, place, and time. He appears well-developed and well-nourished. No distress.   HENT:  Head: Normocephalic and atraumatic.  Mouth/Throat: Oropharynx is clear and moist. No oropharyngeal exudate.  Eyes: Conjunctivae are normal. Right eye exhibits no discharge. Left eye exhibits no discharge. No scleral icterus.  Neck: Normal range of motion. Neck supple. No JVD present. No tracheal deviation present. No thyromegaly present.  Cardiovascular: Normal rate, regular rhythm, normal heart sounds and intact distal pulses.  Exam reveals no gallop and no friction rub.   No murmur heard. Pulmonary/Chest: Effort normal and breath sounds normal. No stridor. No respiratory distress. He has no wheezes. He has no rales. He exhibits no tenderness.  Abdominal: Soft. Bowel sounds are normal. He exhibits no distension and no mass. There is no tenderness. There is no rebound and no guarding.  Musculoskeletal: Normal range of motion. He exhibits no edema and no tenderness.  Lymphadenopathy:    He has no cervical adenopathy.  Neurological: He is oriented to person, place, and time.  Skin: Skin is warm, dry and intact. Rash noted. No abrasion, no bruising, no burn, no ecchymosis, no laceration, no lesion, no petechiae and no purpura noted. Rash is urticarial. Rash is not macular, not papular, not maculopapular, not nodular, not pustular and not vesicular. He is not diaphoretic. There is erythema. No cyanosis. No pallor. Nails show no clubbing.     Psychiatric: He has a normal mood and affect. His behavior is normal. Judgment and thought content normal.     Lab Results  Component Value Date   WBC 5.8 05/05/2013   HGB 12.7* 05/05/2013   HCT 37.2* 05/05/2013   PLT 227 05/05/2013   GLUCOSE 200* 05/05/2013   CHOL 138 01/13/2013   TRIG 96.0 01/13/2013   HDL 53.90 01/13/2013   LDLCALC 65 01/13/2013   ALT 7 05/04/2013  AST 8 05/04/2013   NA 137 05/05/2013   K 3.7 05/05/2013   CL 103 05/05/2013   CREATININE 0.73 05/05/2013   BUN 11 05/05/2013   CO2 22 05/05/2013   TSH 0.72 01/13/2013   PSA 3.80  01/13/2013   HGBA1C 11.6* 05/05/2013   MICROALBUR 11.8* 01/13/2013       Assessment & Plan:

## 2013-06-04 ENCOUNTER — Encounter: Payer: Self-pay | Admitting: Internal Medicine

## 2013-06-04 DIAGNOSIS — L500 Allergic urticaria: Secondary | ICD-10-CM | POA: Insufficient documentation

## 2013-06-04 NOTE — Assessment & Plan Note (Signed)
Managed by Dr Gherghe 

## 2013-06-04 NOTE — Assessment & Plan Note (Signed)
Start Benicar

## 2013-06-04 NOTE — Assessment & Plan Note (Signed)
Stop the ACEI due to the urticaria Will start Benicar for BP control and renal protection

## 2013-06-04 NOTE — Assessment & Plan Note (Signed)
He will stop the ACEI He will start benadryl for the rash and itching

## 2013-06-17 ENCOUNTER — Ambulatory Visit (INDEPENDENT_AMBULATORY_CARE_PROVIDER_SITE_OTHER): Payer: Medicare Other | Admitting: Internal Medicine

## 2013-06-17 ENCOUNTER — Encounter: Payer: Self-pay | Admitting: Internal Medicine

## 2013-06-17 VITALS — BP 148/88 | HR 76 | Temp 98.3°F | Resp 12 | Wt 220.0 lb

## 2013-06-17 DIAGNOSIS — E162 Hypoglycemia, unspecified: Secondary | ICD-10-CM | POA: Insufficient documentation

## 2013-06-17 LAB — GLUCOSE, POCT (MANUAL RESULT ENTRY): POC Glucose: 403 mg/dl — AB (ref 70–99)

## 2013-06-17 NOTE — Patient Instructions (Signed)
Type 1 Diabetes Mellitus, Adult Type 1 diabetes mellitus, often simply referred to as diabetes, is a long-term (chronic) disease. It occurs when the islet cells in the pancreas that make insulin (a hormone) are destroyed and can no longer make insulin. Insulin is needed to move sugars from food into the tissue cells. The tissue cells use the sugars for energy. In people with type 1 diabetes, the sugars build up in the blood instead of going into the tissue cells. As a result, high blood sugar (hyperglycemia) develops. Without insulin, the body breaks down fat cells for the needed energy. This breakdown of fat cells produces acid chemicals (ketones), which increases the acid levels in the body. The effect of either high ketone or sugar (glucose) levels can be life-threatening.  Type 1 diabetes was also previously called juvenile diabetes. It most often occurs before the age of 30, but it can occur at any age. RISK FACTORS A person is predisposed to developing type 1 diabetes if someone in his or her family has the disease and is exposed to certain additional environmental triggers.  SYMPTOMS  Symptoms of type 1 diabetes may develop gradually over days to weeks or suddenly. The symptoms occur due to hyperglycemia. The symptoms can include:   Increased thirst (polydipsia).  Increased urination (polyuria).  Increased urination during the night (nocturia).  Weight loss. This weight loss may be rapid.  Frequent, recurring infections.  Tiredness (fatigue).  Weakness.  Vision changes, such as blurred vision.  Fruity smell to your breath.  Abdominal pain.  Nausea or vomiting. DIAGNOSIS  Type 1 diabetes is diagnosed when symptoms of diabetes are present and when blood glucose levels are increased. Your blood glucose level may be checked by one or more of the following blood tests:  A fasting blood glucose test. You will not be allowed to eat for at least 8 hours before a blood sample is  taken.  A random blood glucose test. Your blood glucose is checked at any time of the day regardless of when you ate.  A hemoglobin A1c blood glucose test. A hemoglobin A1c test provides information about blood glucose control over the previous 3 months. TREATMENT  Although type 1 diabetes cannot be prevented, it can be managed with insulin, diet, and exercise.  You will need to take insulin daily to keep blood glucose in the desired range.  You will need to match insulin dosing with exercise and healthy food choices. The treatment goal is to maintain the before-meal blood sugar (preprandial glucose) level at 70 130 mg/dL.  HOME CARE INSTRUCTIONS   Have your hemoglobin A1c level checked twice a year.  Perform daily blood glucose monitoring as directed by your caregiver.  Monitor urine ketones when you are ill and as directed by your caregiver.  Take your insulin as directed by your caregiver to maintain your blood glucose level in the desired range.  Never run out of insulin. It is needed every day.  Adjust insulin based on your intake of carbohydrates. Carbohydrates can raise blood glucose levels but need to be included in your diet. Carbohydrates provide vitamins, minerals, and fiber, which are an essential part of a healthy diet. Carbohydrates are found in fruits, vegetables, whole grains, dairy products, legumes, and foods containing added sugars.    Eat healthy foods. Alternate 3 meals with 3 snacks.  Maintain a healthy weight.  Carry a medical alert card or wear your medical alert jewelry.  Carry a 15 gram carbohydrate snack with you   at all times to treat low blood glucose (hypoglycemia). Some examples of 15 gram carbohydrate snacks include:  Glucose tablets, 3 or 4.   Glucose gel, 15 gram tube.  Raisins, 2 tablespoons (24 grams).  Jelly beans, 6.  Animal crackers, 8.  Fruit juice, regular soda, or low-fat milk, 4 ounces (120 mL).  Gummy treats,  9.    Recognize hypoglycemia. Hypoglycemia occurs with blood glucose levels of 70 mg/dL and below. The risk for hypoglycemia increases when fasting or skipping meals, during or after intense exercise, and during sleep. Hypoglycemia symptoms can include:  Tremors or shakes.  Decreased ability to concentrate.  Sweating.  Increased heart rate.  Headache.  Dry mouth.  Hunger.  Irritability.  Anxiety.  Restless sleep.  Altered speech or coordination.  Confusion.  Treat hypoglycemia promptly. If you are alert and able to safely swallow, follow the 15:15 rule:  Take 15 20 grams of rapid-acting glucose or carbohydrate. Rapid-acting options include glucose gel, glucose tablets, or 4 ounces (120 mL) of fruit juice, regular soda, or low-fat milk.  Check your blood glucose level 15 minutes after taking the glucose.   Take 15 20 grams more of glucose if the repeat blood glucose level is still 70 mg/dL or below.  Eat a meal or snack within 1 hour once blood glucose levels return to normal.  Be alert to polyuria and polydipsia, which are early signs of hyperglycemia. An early awareness of hyperglycemia allows for prompt treatment. Treat hyperglycemia as directed by your caregiver.  Engage in at least 150 minutes of moderate-intensity physical activity a week, spread over at least 3 days of the week or as directed by your caregiver.  Adjust your insulin dosing and food intake as needed if you start a new exercise or sport.  Follow your sick day plan at any time you are unable to eat or drink as usual.   Avoid tobacco use.  Limit alcohol intake to no more than 1 drink per day for nonpregnant women and 2 drinks per day for men. You should drink alcohol only when you are also eating food. Talk with your caregiver about whether alcohol is safe for you. Tell your caregiver if you drink alcohol several times a week.  Follow up with your caregiver regularly.  Schedule an eye exam  within 5 years of diagnosis and then annually.  Perform daily skin and foot care. Examine your skin and feet daily for cuts, bruises, redness, nail problems, bleeding, blisters, or sores. A foot exam by a caregiver should be done annually.  Brush your teeth and gums at least twice a day and floss at least once a day. Follow up with your dentist regularly.  Share your diabetes management plan with your workplace or school.  Stay up-to-date with immunizations.  Learn to manage stress.  Obtain ongoing diabetes education and support as needed.  Participate or seek rehabilitation as needed to maintain or improve independence and quality of life. Request a physical or occupational therapy referral if you are having foot or hand numbness or difficulties with grooming, dressing, eating, or physical activity. SEEK MEDICAL CARE IF:   You are unable to eat food or drink fluids for more than 6 hours.  You have nausea and vomiting for more than 6 hours.  Your blood glucose level is over 240 mg/dL.  There is a change in mental status.  You develop an additional serious illness.  You have diarrhea for more than 6 hours.  You have been   sick or have had a fever for a couple of days and are not getting better.  You have pain during any physical activity. SEEK IMMEDIATE MEDICAL CARE IF:  You have difficulty breathing.  You have moderate to large ketone levels. MAKE SURE YOU:  Understand these instructions.  Will watch your condition.  Will get help right away if you are not doing well or get worse. Document Released: 12/07/2000 Document Revised: 09/03/2012 Document Reviewed: 07/08/2012 [employer] Patient Information 2014 [employer].  

## 2013-06-17 NOTE — Progress Notes (Signed)
Subjective:    Patient ID: Dylan Quinn, male    DOB: [birth year], 77 y.o.   MRN: [id number]  Hypoglycemia This is a new problem. Episode onset: last night at 3 AM. The problem has been rapidly improving. Associated symptoms include weakness. Pertinent negatives include no abdominal pain, anorexia, arthralgias, change in bowel habit, chest pain, chills, congestion, coughing, diaphoresis, fatigue, fever, headaches, joint swelling, myalgias, nausea, neck pain, numbness, rash, sore throat, swollen glands, urinary symptoms, vertigo, visual change or vomiting. Associated symptoms comments: He was asleep and his wife heard him moaning, she called EMS, they came, his blood sugar was 18. He does not know why this happened. He ate a large "[place] meal" at 6:30 and gave himself 15 U of Novolog with the meal and then the usual 20 U lantus at bedtime. He says that this has never happened before. He has felt well today and appetite has been good.. Nothing aggravates the symptoms. He has tried eating for the symptoms. The treatment provided significant relief.      Review of Systems  Constitutional: Negative for fever, chills, diaphoresis, activity change, appetite change, fatigue and unexpected weight change.  HENT: Negative.  Negative for congestion, sore throat and neck pain.   Eyes: Negative.   Respiratory: Negative.  Negative for cough.   Cardiovascular: Negative.  Negative for chest pain.  Gastrointestinal: Negative.  Negative for nausea, vomiting, abdominal pain, blood in stool, anorexia and change in bowel habit.  Endocrine: Negative.   Genitourinary: Negative.   Musculoskeletal: Negative for myalgias, joint swelling and arthralgias.  Skin: Negative.  Negative for rash.  Allergic/Immunologic: Negative.   Neurological: Positive for weakness. Negative for vertigo, numbness and headaches.  Hematological: Negative.   Psychiatric/Behavioral: Negative.        Objective:   Physical Exam  Vitals  reviewed. Constitutional: He is oriented to person, place, and time. He appears well-developed and well-nourished. No distress.  HENT:  Head: Normocephalic and atraumatic.  Mouth/Throat: Oropharynx is clear and moist. No oropharyngeal exudate.  Eyes: Conjunctivae are normal. Right eye exhibits no discharge. Left eye exhibits no discharge. No scleral icterus.  Neck: Normal range of motion. Neck supple. No JVD present. No tracheal deviation present. No thyromegaly present.  Cardiovascular: Normal rate, regular rhythm, normal heart sounds and intact distal pulses.  Exam reveals no gallop and no friction rub.   No murmur heard. Pulmonary/Chest: Effort normal and breath sounds normal. No stridor. No respiratory distress. He has no wheezes. He has no rales. He exhibits no tenderness.  Abdominal: Soft. Bowel sounds are normal. He exhibits no distension and no mass. There is no tenderness. There is no rebound and no guarding.  Musculoskeletal: Normal range of motion. He exhibits no edema and no tenderness.  Lymphadenopathy:    He has no cervical adenopathy.  Neurological: He is oriented to person, place, and time.  Skin: Skin is warm and dry. No rash noted. He is not diaphoretic. No erythema. No pallor.  Psychiatric: He has a normal mood and affect. His behavior is normal. Judgment and thought content normal.     Lab Results  Component Value Date   WBC 5.8 05/05/2013   HGB 12.7* 05/05/2013   HCT 37.2* 05/05/2013   PLT 227 05/05/2013   GLUCOSE 200* 05/05/2013   CHOL 138 01/13/2013   TRIG 96.0 01/13/2013   HDL 53.90 01/13/2013   LDLCALC 65 01/13/2013   ALT 7 05/04/2013   AST 8 05/04/2013   NA 137 05/05/2013   K  3.7 05/05/2013   CL 103 05/05/2013   CREATININE 0.73 05/05/2013   BUN 11 05/05/2013   CO2 22 05/05/2013   TSH 0.72 01/13/2013   PSA 3.80 01/13/2013   HGBA1C 11.6* 05/05/2013   MICROALBUR 11.8* 01/13/2013       Assessment & Plan:

## 2013-06-17 NOTE — Assessment & Plan Note (Signed)
He will lower his PM dose of novolog to 10 U (not 15U)

## 2013-06-19 ENCOUNTER — Ambulatory Visit: Payer: Medicare Other | Admitting: Internal Medicine

## 2013-07-03 ENCOUNTER — Ambulatory Visit: Payer: Medicare Other | Admitting: Internal Medicine

## 2013-07-07 ENCOUNTER — Ambulatory Visit: Payer: Medicare Other | Admitting: Internal Medicine

## 2013-07-14 ENCOUNTER — Ambulatory Visit (INDEPENDENT_AMBULATORY_CARE_PROVIDER_SITE_OTHER): Payer: Medicare Other | Admitting: Internal Medicine

## 2013-07-14 ENCOUNTER — Encounter: Payer: Self-pay | Admitting: Internal Medicine

## 2013-07-14 VITALS — BP 140/80 | HR 68 | Temp 98.7°F | Wt 222.0 lb

## 2013-07-14 DIAGNOSIS — E1049 Type 1 diabetes mellitus with other diabetic neurological complication: Secondary | ICD-10-CM

## 2013-07-14 DIAGNOSIS — E1142 Type 2 diabetes mellitus with diabetic polyneuropathy: Secondary | ICD-10-CM

## 2013-07-14 MED ORDER — GLUCAGON (RDNA) 1 MG IJ KIT: 1.0000 mg | PACK | Freq: Once | INTRAMUSCULAR | Status: DC | PRN

## 2013-07-14 MED ORDER — INSULIN GLARGINE 100 UNIT/ML SOLOSTAR PEN: 22.0000 [IU] | PEN_INJECTOR | Freq: Every day | SUBCUTANEOUS | Status: DC

## 2013-07-14 NOTE — Progress Notes (Signed)
Patient ID: Dylan Quinn, male   DOB: [birth year], 77 y.o.   MRN: [id number]  HPI: Dylan Quinn is a 77 y.o.-year-old male-year-old male, returning for f/u of DM2, dx 2007, insulin-dependent, uncontrolled, with complications (severe hypoglycemia, widely fluctuating sugars, recent admission for DKA, diabetic retinopathy, CKD, peripheral neuropathy, mild proteinuria). He is here with his wife, who is also a patient of mine, and who offers a part of the history. He missed the last 2 appointments with me.  He was on insulin pump from 2005-2009. Last hemoglobin A1c was: Lab Results  Component Value Date   HGBA1C 11.6* 05/05/2013  previously 12.7%, while a year ago 10.6%.  Patient was previously seen by Dr. Everardo All, at last visit in 03/2013, he was switched from Lantus 60 units in the morning,to NovoLog 70/30 60 units in a.m., as he was having hypoglycemia 40-50. On this regimen, his sugar started to increase to 400, with progressive nausea/vomiting/abdominal pain/polyuria/polydipsia. With these symptoms he presented to the hospital and he was admitted with a CBG of 489, bicarbonate 16, anion gap of 23. His urine was positive for glucose and ketones. He was switched back to Lantus 40 units each bedtime and NovoLog 0-15 units per sliding scale in the hospital.   Since last visit, patient had a very low blood sugar, at 18, at night, 1 mo ago. Wife woke up as the patient was moaning. EMS was called. They mentioned that he had a large [place] meal the night before, and injected 15 units of NovoLog with this and then 10 units of Lantus at bedtime.   They tells me they will go visit their daughter this week in [place] for 1 mo >> will have nutrition counselling (a wellness program) and have high hopes that this will help pt with diet). This is great!  Pt was previously on a regimen of: - Lantus 40 hs - Novolog SSI tid ac: target 120,  - 121-150: 2 units - 151-200: 3 units - 201-250: 5 units Etc. until - >400: 15 units  and call office  At our last visit, we changed to the following regimen: Lantus 25 units. Novolog - normal meal: 6 units - large meal: 8 units + sliding scale to the above regimen: - 150-175: + 1 unit - 176-200: + 2 units - 201-225: + 3 units - 226-250: + 4 units - 251-275: + 5 units - 276-300 : + 6 units - >301: + 7 units Pt was advised not inject the sliding scale at bedtime, unless >300, and then inject half of the above doses.  He continues drinking sweet tea.   Pt checks his sugars 3x a day and they are: - am: 39-400 - There is just a day 200s to 550  Has frequent lows. Lowest sugar was 18 (see above) - 3 sugars in the 40-50s, most sugars high;  he has hypoglycemia awareness at 75. Highest sugar was HI, but a large proportion of sugars are ~300-400.  Pt does not have chronic kidney disease, last BUN/creatinine was:  Lab Results  Component Value Date   BUN 11 05/05/2013   CREATININE 0.73 05/05/2013  Last microalbumin/creatinine ratio was 5.4 on 01/13/2013. He is on Lisinopril.  Last set of lipids: Lab Results  Component Value Date   CHOL 138 01/13/2013   HDL 53.90 01/13/2013   LDLCALC 65 01/13/2013   TRIG 96.0 01/13/2013   CHOLHDL 3 01/13/2013   Pt's last eye exam was in 01/2013 (Dr. Elmer Picker). + DR - will  have Laser Sx on both eyes. Denies numbness and tingling in his legs.  PMH: I reviewed his chart and he also has a history of anemia, hypogonadism, glaucoma, hypoacusis, BPH, hypertension, osteoporosis, lumbar radiculopathy, history of urinary calculus, psoriasis.  ROS: Constitutional: no weight gain/loss, + fatigue, no subjective hyperthermia/hypothermia, + nocturia Eyes: no blurry vision, no xerophthalmia ENT: no sore throat, no nodules palpated in throat, no dysphagia/odynophagia, no hoarseness, decreased hearing Cardiovascular: no CP/SOB/palpitations/leg swelling Respiratory: no cough/SOB Gastrointestinal: no N/V/D/C Musculoskeletal: no muscle/joint aches Skin:  + rash on left arm Neurological: no tremors/numbness/tingling/dizziness Low libido, pbs with erections  PE: BP 140/80  Pulse 68  Temp(Src) 98.7 F (37.1 C) (Oral)  Wt 222 lb (100.699 kg)  BMI 29.3 kg/m2 Wt Readings from Last 3 Encounters:  07/14/13 222 lb (100.699 kg)  06/17/13 220 lb (99.791 kg)  06/03/13 219 lb (99.338 kg)   Constitutional: overweight, in NAD Eyes: PERRLA, EOMI, no exophthalmos ENT: moist mucous membranes, no thyromegaly, no cervical lymphadenopathy Cardiovascular: RRR, No MRG Respiratory: CTA B Gastrointestinal: abdomen soft, NT, ND, BS+ Musculoskeletal: no deformities, strength intact in all 4 Skin: moist, warm, faint rash on left forearm Neurological: no tremor with outstretched hands, DTR normal in all 4  ASSESSMENT: 1. DM2, insulin-dependent, uncontrolled, with complications - diabetic retinopathy - CKD, mild proteinuria - peripheral neuropathy-on Lyrica  PLAN:  1. Pt with brittle DM - most likely LADA (based on his wide sugar fluctuations, his body habitus, increase sensitivity to insulin, development of DKA if not covered with insulin for 24 hours). Recent episode of very low sugar at 3 am! - patient has been on a high dose of Lantus (60 units) which was dropping his sugars into the 40s in the morning, then decreased to 40 units, which still decreased his sugars too much over night... At last visit, we decreased it to 25 units. He had a CBG of 18 in one of the following nights. It is difficult to know whether this is a Lantus effect or Novolog at bedtime. He eats snacks and drinks sweet tea continuously especially after dinner, and may inject insulin even based on postmeal sugars (I again advised him not to do this anymore).  - For now, I advised him to do the following: Please decrease the Lantus dose to 22 units. Continuet Novolog insulin 15 minutes before your main meals as follows: - normal meal: 6 units - large meal: 8 units Use 3 units with your  sandwich at bedtime. Continue the following sliding scale: - 150-175: + 1 unit - 176-200: + 2 units - 201-225: + 3 units - 226-250: + 4 units - 251-275: + 5 units - 276-300 : + 6 units - >301: + 7 units Please do not inject the sliding scale at bedtime. - We again discussed about diet >> they will get nutrition advice [facility]  - advised him to send me sugars and I will see him back in 1 mo - sent Rx for glucagon pen

## 2013-07-14 NOTE — Patient Instructions (Signed)
Please decrease the Lantus to 22 units. Please inject 3 units of Novolog at night, if you eat a sandwich or another snack containing carbs. Please send me an email with your sugars or send them to me through MyChart.

## 2013-07-29 ENCOUNTER — Other Ambulatory Visit: Payer: Self-pay | Admitting: Internal Medicine

## 2013-07-30 ENCOUNTER — Other Ambulatory Visit: Payer: Self-pay | Admitting: *Deleted

## 2013-07-30 MED ORDER — PREGABALIN 100 MG PO CAPS: 100.0000 mg | ORAL_CAPSULE | Freq: Two times a day (BID) | ORAL | Status: DC

## 2013-08-03 ENCOUNTER — Ambulatory Visit (INDEPENDENT_AMBULATORY_CARE_PROVIDER_SITE_OTHER): Payer: Medicare Other | Admitting: Internal Medicine

## 2013-08-03 DIAGNOSIS — I10 Essential (primary) hypertension: Secondary | ICD-10-CM

## 2013-10-29 ENCOUNTER — Other Ambulatory Visit: Payer: Self-pay

## 2013-11-26 ENCOUNTER — Ambulatory Visit (INDEPENDENT_AMBULATORY_CARE_PROVIDER_SITE_OTHER): Payer: Medicare Other | Admitting: Internal Medicine

## 2013-11-26 ENCOUNTER — Encounter: Payer: Self-pay | Admitting: Internal Medicine

## 2013-11-26 VITALS — BP 138/80 | HR 80 | Temp 98.1°F | Resp 10 | Wt 225.0 lb

## 2013-11-26 DIAGNOSIS — E1142 Type 2 diabetes mellitus with diabetic polyneuropathy: Secondary | ICD-10-CM

## 2013-11-26 DIAGNOSIS — E1049 Type 1 diabetes mellitus with other diabetic neurological complication: Secondary | ICD-10-CM

## 2013-11-26 LAB — HEMOGLOBIN A1C: Hgb A1c MFr Bld: 10.5 % — ABNORMAL HIGH (ref 4.6–6.5)

## 2013-11-26 MED ORDER — INSULIN ASPART 100 UNIT/ML ~~LOC~~ SOLN: SUBCUTANEOUS | Status: DC

## 2013-11-26 MED ORDER — INSULIN GLARGINE 100 UNIT/ML SOLOSTAR PEN: 22.0000 [IU] | PEN_INJECTOR | Freq: Every day | SUBCUTANEOUS | Status: DC

## 2013-11-26 NOTE — Patient Instructions (Signed)
Please return in 1 month with your sugar log.   Continue the following regimen: Lantus 22 units at night Novolog mealtime insulin - normal meal: 6 units  - large meal: 10 units  Use 6 units with snack/meal at bedtime.  Add the following sliding scale to the above regimen:  - 150-175: + 1 unit  - 176-200: + 2 units  - 201-225: + 3 units  - 226-250: + 4 units  - >250: + 5 units Please do not inject the sliding scale at bedtime, unless >300, and then inject half of the above doses.

## 2013-11-26 NOTE — Progress Notes (Signed)
Patient ID: Dylan Quinn, male   DOB: [birth year], 77 y.o.   MRN: [id number]  HPI: Dylan Quinn is a 77 y.o.-year-old male, returning for f/u of DM2, dx 2007, insulin-dependent, uncontrolled, with complications (severe hypoglycemia, widely fluctuating sugars, recent admission for DKA, diabetic retinopathy, CKD, peripheral neuropathy, mild proteinuria). He is here with his wife, who is also a patient of mine, and who offers a part of the history. Last visit approximately 5 months ago.   Patient and his wife (results a patient of mine) were in [place] for a long time, and they returned recently. They will go back there next week. His wife was recently diagnosed with kidney cancer.  He was on insulin pump from 2005-2009. Last hemoglobin A1c was: Lab Results  Component Value Date   HGBA1C 11.6* 05/05/2013   HGBA1C 12.7* 01/13/2013   HGBA1C 10.6* 03/06/2012   Patient has a history of DKA, and also severe hypoglycemia, last episode approximately 6 months ago (before our last visit), when CBG was 18 at night. Wife woke up as the patient was moaning. EMS was called. They mentioned that he had a large [place] meal the night before, and injected 15 units of NovoLog with this and then 10 units of Lantus at bedtime.  He is on the following insulin regimen: Lantus 22 << 25 units - pens Novolog - pens - normal meal: 6 units - large meal: 10 units Use 3 units with sandwich at bedtime >> does not use this + sliding scale to the above regimen: - 150-175: + 1 unit - 176-200: + 2 units - 201-225: + 3 units - 226-250: + 4 units - 251-275: + 5 units - 276-300 : + 6 units - >301: + 7 units Pt was advised not inject the sliding scale at bedtime, unless >300, and then inject half of the above doses >> he still injecting insulin at bedtime (see below).  Pt checks his sugars 3x a day and they are (does not bring sugar log) - am: 50-400 - bedtime: 300s-400s: takes 5 units of NovoLog at bedtime >> but at one  time, he took 8 units for a sugar of 400   Has frequent lows. Lowest sugar was 18 (see above) but not recently;  he has hypoglycemia awareness at 77. Most recent low was in the 50s - after took 8 units of NovoLog at bedtime.  Pt does not have chronic kidney disease, last BUN/creatinine was:  Lab Results  Component Value Date   BUN 11 05/05/2013   CREATININE 0.73 05/05/2013  Last microalbumin/creatinine ratio was 5.4 on 01/13/2013. He is on Lisinopril.  Last set of lipids: Lab Results  Component Value Date   CHOL 138 01/13/2013   HDL 53.90 01/13/2013   LDLCALC 65 01/13/2013   TRIG 96.0 01/13/2013   CHOLHDL 3 01/13/2013   Pt's last eye exam was in 01/2013 (Dr. Elmer Picker). + DR - will have Laser Sx on both eyes. Denies numbness and tingling in his legs.  PMH: Patient also has a history of anemia, hypogonadism, glaucoma, hypoacusis, BPH, hypertension, osteoporosis, lumbar radiculopathy, history of urinary calculus, psoriasis.  ROS: Constitutional: + weight gain, no fatigue, no subjective hyperthermia/hypothermia, + nocturia Eyes: no blurry vision, no xerophthalmia ENT: no sore throat, no nodules palpated in throat, no dysphagia/odynophagia, no hoarseness, + decreased hearing Cardiovascular: no CP/SOB/palpitations/leg swelling Respiratory: no cough/SOB Gastrointestinal: no N/V/D/C Musculoskeletal: no muscle/joint aches Skin: + rash, + hair loss Neurological: no tremors/numbness/tingling/dizziness, + HA Low libido, pbs with  erections  I reviewed pt's medications, allergies, PMH, social hx, family hx and no changes required, except as mentioned above.  PE: BP 138/80  Pulse 80  Temp(Src) 98.1 F (36.7 C) (Oral)  Resp 10  Wt 225 lb (102.059 kg)  SpO2 95% Body mass index is 29.69 kg/(m^2).  Wt Readings from Last 3 Encounters:  11/26/13 225 lb (102.059 kg)  07/14/13 222 lb (100.699 kg)  06/17/13 220 lb (99.791 kg)   Constitutional: overweight, in NAD Eyes: PERRLA, EOMI, no  exophthalmos ENT: moist mucous membranes, no thyromegaly, no cervical lymphadenopathy Cardiovascular: RRR, No MRG Respiratory: CTA B Gastrointestinal: abdomen soft, NT, ND, BS+ Musculoskeletal: no deformities, strength intact in all 4 Skin: moist, warm, faint rash on left forearm Neurological: no tremor with outstretched hands, DTR normal in all 4  ASSESSMENT: 1. DM2, insulin-dependent, uncontrolled, with complications - diabetic retinopathy - CKD, mild proteinuria - peripheral neuropathy-on Lyrica  PLAN:  1. Pt with brittle DM - most likely LADA (based on his wide sugar fluctuations, his body habitus, increase sensitivity to insulin, development of DKA if not covered with insulin for 24 hours). He has wide fluctuations in his sugars from very low to very high CBGs. - At last visit, I decreased the dose of Lantus, to avoid him to develop severe hypoglycemia, which he has a history of. I also gave him a regimen of gauging the mealtime insulin based on the size of the meal. He has implemented both of these changes. However, I also advised him regarding limiting the amount of sweets that he gets in his diet, and I advised him to get insulin with his bedtime snack/meal, and to avoid taking insulin at that time, however if he has to do is to only take half of the amount based on the sliding scale. He did not institute these changes yet. He is not getting rapid acting insulin with his snack at night and at one point he took 8 units for a glucose of 400 at bedtime, and as a consequence, his sugars dropped to the 50s at 5 in the morning. - We discussed about the importance of the above changes, and I will maintain the previous regimen, but reinforce compliance: Patient Instructions  Please return in 1 month with your sugar log.   Continue the following regimen: Lantus 22 units at night Novolog mealtime insulin - normal meal: 6 units  - large meal: 10 units  Use 6 units with snack/meal at bedtime.   Add the following sliding scale to the above regimen:  - 150-175: + 1 unit  - 176-200: + 2 units  - 201-225: + 3 units  - 226-250: + 4 units  - >250: + 5 units Please do not inject the sliding scale at bedtime, unless >300, and then inject half of the above doses.  - also advised him to bring his sugar log next time - advised him to send me sugars to my chart, e-mail, fax, or phone - up to date with annual eye exams - would check a hemoglobin A1c today - we discussed about the possibility of restarting an insulin pump, however he had many problems with it and does not want to get back on the pump - RTC in 1 month  Office Visit on 11/26/2013  Component Date Value Range Status  . Hemoglobin A1C 11/26/2013 10.5* 4.6 - 6.5 % Final   Glycemic Control Guidelines for People with Diabetes:Non Diabetic:  <6%Goal of Therapy: <7%Additional Action Suggested:  >8%  Hba1c a little better, but still high.

## 2013-12-29 ENCOUNTER — Ambulatory Visit: Payer: Medicare Other | Admitting: Internal Medicine

## 2014-04-12 ENCOUNTER — Ambulatory Visit
Admission: RE | Admit: 2014-04-12 | Discharge: 2014-04-12 | Disposition: A | Discharge status: Home / Self Care | Payer: No Typology Code available for payment source | Source: Ambulatory Visit | Attending: Nephrology | Admitting: Nephrology

## 2014-04-12 DIAGNOSIS — R809 Proteinuria, unspecified: Secondary | ICD-10-CM | POA: Insufficient documentation

## 2014-04-12 DIAGNOSIS — N182 Chronic kidney disease, stage 2 (mild): Secondary | ICD-10-CM | POA: Insufficient documentation

## 2014-04-12 DIAGNOSIS — R3129 Other microscopic hematuria: Secondary | ICD-10-CM | POA: Insufficient documentation

## 2014-04-12 DIAGNOSIS — E119 Type 2 diabetes mellitus without complications: Secondary | ICD-10-CM

## 2014-04-12 LAB — TSH: TSH: 0.87 u[IU]/mL (ref 0.35–4.94)

## 2014-04-12 LAB — COMPREHENSIVE METABOLIC PANEL
ALT: 19 U/L (ref 0–55)
AST (SGOT): 17 U/L (ref 5–34)
Albumin/Globulin Ratio: 1.2 (ref 0.9–2.2)
Albumin: 3.7 g/dL (ref 3.5–5.0)
Alkaline Phosphatase: 134 U/L — ABNORMAL HIGH (ref 38–106)
BUN: 18 mg/dL (ref 9.0–28.0)
Bilirubin, Total: 0.5 mg/dL (ref 0.1–1.2)
CO2: 25 mEq/L (ref 21–30)
Calcium: 9.2 mg/dL (ref 7.9–10.2)
Chloride: 107 mEq/L (ref 100–111)
Creatinine: 1 mg/dL (ref 0.5–1.5)
Globulin: 3 g/dL (ref 2.0–3.7)
Glucose: 146 mg/dL — ABNORMAL HIGH (ref 70–100)
Potassium: 4.3 mEq/L (ref 3.5–5.3)
Protein, Total: 6.7 g/dL (ref 6.0–8.3)
Sodium: 144 mEq/L (ref 135–146)

## 2014-04-12 LAB — CBC
Hematocrit: 38.8 % — ABNORMAL LOW (ref 42.0–52.0)
Hgb: 13.1 g/dL (ref 13.0–17.0)
MCH: 30.3 pg (ref 28.0–32.0)
MCHC: 33.8 g/dL (ref 32.0–36.0)
MCV: 89.8 fL (ref 80.0–100.0)
MPV: 12.8 fL — ABNORMAL HIGH (ref 9.4–12.3)
Nucleated RBC: 0 /100 WBC (ref 0–1)
Platelets: 209 10*3/uL (ref 140–400)
RBC: 4.32 10*6/uL — ABNORMAL LOW (ref 4.70–6.00)
RDW: 14 % (ref 12–15)
WBC: 3.8 10*3/uL (ref 3.50–10.80)

## 2014-04-12 LAB — MICROALBUMIN, RANDOM URINE
Urine Creatinine, Random: 226.9
Urine Microalbumin, Random: 291 — ABNORMAL HIGH (ref 0.0–30.0)
Urine Microalbumin/Creatinine Ratio: 128 ug/mg (ref 66–163)

## 2014-04-12 LAB — GFR: EGFR: >60

## 2014-04-12 LAB — HEMOLYSIS INDEX: Hemolysis Index: 10 (ref 0–18)

## 2014-04-12 LAB — LIPID PANEL
Cholesterol / HDL Ratio: 2.8
Cholesterol: 140 mg/dL (ref 0–199)
HDL: 50 mg/dL (ref >40)
LDL Calculated: 76 mg/dL (ref 0–99)
Triglycerides: 68 mg/dL (ref 34–149)
VLDL Calculated: 14 mg/dL (ref 10–40)

## 2014-06-28 ENCOUNTER — Encounter: Payer: Self-pay | Admitting: Internal Medicine

## 2014-06-28 ENCOUNTER — Other Ambulatory Visit: Payer: Self-pay | Admitting: *Deleted

## 2014-06-28 ENCOUNTER — Ambulatory Visit (INDEPENDENT_AMBULATORY_CARE_PROVIDER_SITE_OTHER): Payer: Medicare Other | Admitting: Internal Medicine

## 2014-06-28 VITALS — BP 128/84 | HR 68 | Temp 98.1°F | Resp 12 | Wt 219.0 lb

## 2014-06-28 DIAGNOSIS — E1040 Type 1 diabetes mellitus with diabetic neuropathy, unspecified: Secondary | ICD-10-CM

## 2014-06-28 DIAGNOSIS — E1049 Type 1 diabetes mellitus with other diabetic neurological complication: Secondary | ICD-10-CM

## 2014-06-28 DIAGNOSIS — E1142 Type 2 diabetes mellitus with diabetic polyneuropathy: Secondary | ICD-10-CM

## 2014-06-28 LAB — HM DIABETES EYE EXAM

## 2014-06-28 LAB — HEMOGLOBIN A1C: HEMOGLOBIN A1C: 12 % — AB (ref 4.6–6.5)

## 2014-06-28 MED ORDER — INSULIN ASPART 100 UNIT/ML ~~LOC~~ SOLN: SUBCUTANEOUS | Status: DC

## 2014-06-28 MED ORDER — GLUCOSE BLOOD VI STRP: ORAL_STRIP | Status: DC

## 2014-06-28 MED ORDER — INSULIN GLARGINE 100 UNIT/ML SOLOSTAR PEN: 22.0000 [IU] | PEN_INJECTOR | Freq: Every day | SUBCUTANEOUS | Status: DC

## 2014-06-28 MED ORDER — INSULIN PEN NEEDLE 32G X 4 MM MISC: Status: DC

## 2014-06-28 NOTE — Patient Instructions (Addendum)
Please decrease Lantus to 15 units and move it to am. Increase Novolog mealtime insulin to - normal meal: 7 units  - large meal: 11 units  Use 5 units with snack/meal at bedtime.  Add the following sliding scale to the above regimen:  - 150-175: + 1 unit  - 176-200: + 2 units  - 201-225: + 3 units  - 226-250: + 4 units  - >250: + 5 units Please do not inject the sliding scale at bedtime, unless >300, and then inject half of the above doses.   Please return in with your sugar log when you come back to [place].

## 2014-06-28 NOTE — Progress Notes (Signed)
Patient ID: Dylan Quinn, male   DOB: 07-16-1936, 78 y.o.   MRN: [id number]  HPI: Dylan Quinn is a 78 y.o.-year-old male, returning for f/u of DM2, dx 2007, insulin-dependent, uncontrolled, with complications (severe hypoglycemia, widely fluctuating sugars, recent admission for DKA, diabetic retinopathy, CKD, peripheral neuropathy, mild proteinuria). Last visit approximately 6 months ago.   Patient and his wife (also a patient of mine) were in [place] ([place]) for a long time, and they returned recently. They will go back there next week. His wife was diagnosed with kidney cancer >> had surgery.  He was on insulin pump from 2005-2009. Does not want to go back to one.  Last hemoglobin A1c was: Lab Results  Component Value Date   HGBA1C 10.5* 11/26/2013   HGBA1C 11.6* 05/05/2013   HGBA1C 12.7* 01/13/2013   Patient has a history of DKA, and also severe hypoglycemia, lowest CBG was 18 at night. Lowest now 57.   He is on the following insulin regimen: Lantus 22 units at night Novolog mealtime insulin - normal meal: 6 units  - large meal: 10 units  Use 6 units with snack/meal at bedtime.  Add the following sliding scale to the above regimen:  - 150-175: + 1 unit  - 176-200: + 2 units  - 201-225: + 3 units  - 226-250: + 4 units  - >250: + 5 units Please do not inject the sliding scale at bedtime, unless >300, and then inject half of the above doses.   He tells me if he is <200, he does not take Lantus or he takes half.  Pt checks his sugars 3x a day and they are (does  bring sugar log) - am: 50-400 >> 49 x 1, 56 x1-329, but most 100s - bedtime: 300s-400s >> 181-HI, mostly 300s  Has frequent lows. Lowest sugar was 18 (see above) but not recently;  he has hypoglycemia awareness at 77. Most recent low was 49 - after took 8 units of NovoLog at bedtime.  Pt does not have chronic kidney disease, last BUN/creatinine was:  Lab Results  Component Value Date   BUN 11 05/05/2013   CREATININE 0.73 05/05/2013  He is on Lisinopril.  Last set of lipids: Lab Results  Component Value Date   CHOL 138 01/13/2013   HDL 53.90 01/13/2013   LDLCALC 65 01/13/2013   TRIG 96.0 01/13/2013   CHOLHDL 3 01/13/2013   Pt's last eye exam was in 01/2013 (Dr. Herbert Deaner). + DR - had Laser Sx on one eye. He will see his eye Dr. This pm.  Denies numbness and tingling in his legs.  PMH: Patient also has a history of anemia, hypogonadism, glaucoma, hypoacusis, BPH, hypertension, osteoporosis, lumbar radiculopathy, history of urinary calculus, psoriasis.  ROS: Constitutional: no weight gain, + fatigue, no subjective hyperthermia/hypothermia, + nocturia, + excessive urination Eyes: no blurry vision, no xerophthalmia ENT: no sore throat, no nodules palpated in throat, no dysphagia/odynophagia, no hoarseness, + decreased hearing Cardiovascular: no CP/SOB/palpitations/+ leg swelling Respiratory: no cough/SOB Gastrointestinal: no N/V/D/C Musculoskeletal: no muscle/joint aches Skin: no rash Neurological: no tremors/numbness/tingling/dizziness, no HA + Low libido, + pbs with erections  I reviewed pt's medications, allergies, PMH, social hx, family hx and no changes required, except as mentioned above.  PE: BP 128/84  Pulse 68  Temp(Src) 98.1 F (36.7 C) (Oral)  Resp 12  Wt 219 lb (99.338 kg)  SpO2 96% Body mass index is 28.9 kg/(m^2).  Wt Readings from Last 3 Encounters:  06/28/14 219  lb (99.338 kg)  11/26/13 225 lb (102.059 kg)  07/14/13 222 lb (100.699 kg)   Constitutional: normal weight, in NAD Eyes: PERRLA, EOMI, no exophthalmos ENT: moist mucous membranes, no thyromegaly, no cervical lymphadenopathy Cardiovascular: RRR, No MRG Respiratory: CTA B Gastrointestinal: abdomen soft, NT, ND, BS+ Musculoskeletal: no deformities, strength intact in all 4 Skin: moist, warm, faint rash on left forearm Neurological: no tremor with outstretched hands, DTR normal in all 4  ASSESSMENT: 1.  DM2, insulin-dependent, uncontrolled, with complications - diabetic retinopathy - CKD, mild proteinuria - peripheral neuropathy-on Lyrica  PLAN:  1. Pt with brittle DM - most likely LADA (based on his wide sugar fluctuations, his body habitus, increase sensitivity to insulin, development of DKA if not covered with insulin for 24 hours). He has wide fluctuations in his sugars from very low to very high CBGs.  - Since he is skipping Lantus to avoid lows over night and in am, will decrease Lantus and move it to am. I will increase a little the NovoLog as he has a staircase effect of sugars throughout the day. Also, advised him not to miss the NovoLog with his evening snack, but decrease the dose to 5 units. Patient Instructions  Please decrease Lantus to 15 units and move it to am. Increase Novolog mealtime insulin to - normal meal: 7 units  - large meal: 11 units  Use 5 units with snack/meal at bedtime.  Add the following sliding scale to the above regimen:  - 150-175: + 1 unit  - 176-200: + 2 units  - 201-225: + 3 units  - 226-250: + 4 units  - >250: + 5 units Please do not inject the sliding scale at bedtime, unless >300, and then inject half of the above doses.   Please return in with your sugar log when you come back to [place].  - will be up to date with annual eye exams this pm - will check a hemoglobin A1c today - advised him to send me labs from PCP (GFR, Lipids) - will see him back when he comes back to [place] - hopes in 08/2014.  Lab Results  Component Value Date   HGBA1C 12.0* 06/28/2014   A1c still very high. It does not appear we are making progress. I would like for him to come more often and to watch his diet better and also not to skip insulin doses. Will see how he does on the new regimen.

## 2014-07-03 NOTE — Progress Notes (Signed)
   Subjective:    Patient ID: Dylan Quinn, male    DOB: 04-24-1936, 78 y.o.   MRN: [id number]  HPI   Not seen Review of Systems     Objective:   Physical Exam        Assessment & Plan:

## 2014-09-28 ENCOUNTER — Other Ambulatory Visit (INDEPENDENT_AMBULATORY_CARE_PROVIDER_SITE_OTHER): Payer: Medicare Other | Admitting: *Deleted

## 2014-09-28 ENCOUNTER — Encounter: Payer: Self-pay | Admitting: Internal Medicine

## 2014-09-28 ENCOUNTER — Ambulatory Visit (INDEPENDENT_AMBULATORY_CARE_PROVIDER_SITE_OTHER): Payer: Medicare Other | Admitting: Internal Medicine

## 2014-09-28 VITALS — BP 118/68 | HR 75 | Temp 98.3°F | Resp 12 | Wt 221.0 lb

## 2014-09-28 DIAGNOSIS — Z23 Encounter for immunization: Secondary | ICD-10-CM

## 2014-09-28 DIAGNOSIS — E1049 Type 1 diabetes mellitus with other diabetic neurological complication: Secondary | ICD-10-CM

## 2014-09-28 LAB — BASIC METABOLIC PANEL
BUN: 18 mg/dL (ref 6–23)
CALCIUM: 9.1 mg/dL (ref 8.4–10.5)
CO2: 29 mEq/L (ref 19–32)
Chloride: 106 mEq/L (ref 96–112)
Creatinine, Ser: 0.9 mg/dL (ref 0.4–1.5)
GFR: 100.19 mL/min (ref >60.00)
GLUCOSE: 128 mg/dL — AB (ref 70–99)
POTASSIUM: 3.7 meq/L (ref 3.5–5.1)
SODIUM: 139 meq/L (ref 135–145)

## 2014-09-28 LAB — LIPID PANEL
Cholesterol: 151 mg/dL (ref 0–200)
HDL: 57.4 mg/dL (ref >39.00)
LDL Cholesterol: 77 mg/dL (ref 0–99)
NONHDL: 93.6
Total CHOL/HDL Ratio: 3
Triglycerides: 84 mg/dL (ref 0.0–149.0)
VLDL: 16.8 mg/dL (ref 0.0–40.0)

## 2014-09-28 LAB — HEMOGLOBIN A1C: Hgb A1c MFr Bld: 11.4 % — ABNORMAL HIGH (ref 4.6–6.5)

## 2014-09-28 MED ORDER — INSULIN ASPART 100 UNIT/ML ~~LOC~~ SOLN: SUBCUTANEOUS | Status: DC

## 2014-09-28 MED ORDER — GLUCAGON (RDNA) 1 MG IJ KIT: 1.0000 mg | PACK | Freq: Once | INTRAMUSCULAR | Status: AC | PRN

## 2014-09-28 MED ORDER — INSULIN GLARGINE 100 UNIT/ML SOLOSTAR PEN: 15.0000 [IU] | PEN_INJECTOR | Freq: Every day | SUBCUTANEOUS | Status: DC

## 2014-09-28 MED ORDER — GLUCAGON (RDNA) 1 MG IJ KIT: 1.0000 mg | PACK | Freq: Once | INTRAMUSCULAR | Status: DC | PRN

## 2014-09-28 NOTE — Patient Instructions (Addendum)
Please continue the current regimen: Lantus 15 units in am. Novolog mealtime insulin  - normal meal: 7 units  - large meal: 11 units  Use 5 units with snack/meal at bedtime.  NovoLog liding scale:  - 150-175: + 1 unit  - 176-200: + 2 units  - 201-225: + 3 units  - 226-250: + 4 units  - >250: + 5 units Do not inject the sliding scale at bedtime, unless >300, and then inject half of the above doses.   Please try not to skip insulin doses!  Please stop at the lab.

## 2014-09-28 NOTE — Progress Notes (Signed)
Patient ID: Dylan Quinn, male   DOB: 04-14-1936, 78 y.o.   MRN: [id number]  HPI: Dylan Quinn is a 78 y.o.-year-old male, returning for f/u of DM2, dx 2007, insulin-dependent, uncontrolled, with complications (severe hypoglycemia, widely fluctuating sugars, recent admission for DKA, diabetic retinopathy, CKD, peripheral neuropathy, mild proteinuria). Last visit approximately 3 months ago.   Patient and his wife (also a patient of mine) live in [place] ([place]  He was on insulin pump from 2005-2009. Does not want to go back to one.  Last hemoglobin A1c was: Lab Results  Component Value Date   HGBA1C 12.0* 06/28/2014   HGBA1C 10.5* 11/26/2013   HGBA1C 11.6* 05/05/2013   Patient has a history of DKA, and also severe hypoglycemia, lowest CBG was 18 at night, not recently. Lowest now 66.   At last visit, he was skipping Lantus to avoid lows over night and in am, so we decrease Lantus and moved it to am.I also increased the NovoLog as he had a staircase effect of sugars throughout the day.I advised him not to miss the NovoLog with his evening snack, but decrease the dose to 5 units.  He is on the following insulin regimen: Lantus 22 >> 15 units in am. Novolog mealtime insulin - he forgets to bolus 50% of the time - normal meal: 7 units  - large meal: 11 units  Use 5 units with snack/meal at bedtime.  NovoLog liding scale:  - 150-175: + 1 unit  - 176-200: + 2 units  - 201-225: + 3 units  - 226-250: + 4 units  - >250: + 5 units Do not inject the sliding scale at bedtime, unless >300, and then inject half of the above doses.   Pt checks his sugars 3x a day and they are (brings sugar log) - am: 50-400 >> 49 x 1, 56 x1-329, but most 100s >> 66 x1, 154-279, 300s - bedtime: 300s-400s >> 181-HI, mostly 300s >> 169, 300's-400s No more severe lows. Lowest sugar was 18 (see above) but not recently;  he has hypoglycemia awareness at 78. Most recent low was 49 - after took 8 units of  NovoLog at bedtime.  Pt does not have chronic kidney disease, last BUN/creatinine was:  Lab Results  Component Value Date   BUN 11 05/05/2013   CREATININE 0.73 05/05/2013  He is on Lisinopril.  Last set of lipids: Lab Results  Component Value Date   CHOL 138 01/13/2013   HDL 53.90 01/13/2013   LDLCALC 65 01/13/2013   TRIG 96.0 01/13/2013   CHOLHDL 3 01/13/2013   Pt's last eye exam was in 08/2014. + DR - has Laser Sx Denies numbness and tingling in his legs.  PMH: Patient also has a history of anemia, hypogonadism, glaucoma, hypoacusis, BPH, hypertension, osteoporosis, lumbar radiculopathy, history of urinary calculus, psoriasis.  ROS: Constitutional: no weight gain, + fatigue, no subjective hyperthermia/hypothermia, + nocturia Eyes: no blurry vision, no xerophthalmia ENT: no sore throat, no nodules palpated in throat, no dysphagia/odynophagia, no hoarseness, + decreased hearing Cardiovascular: no CP/SOB/palpitations/leg swelling Respiratory: no cough/SOB Gastrointestinal: no N/V/D/C Musculoskeletal: no muscle/joint aches Skin: no rash Neurological: no tremors/numbness/tingling/dizziness, no HA + Low libido  I reviewed pt's medications, allergies, PMH, social hx, family hx and no changes required, except as mentioned above.  PE: BP 118/68  Pulse 75  Temp(Src) 98.3 F (36.8 C) (Oral)  Resp 12  Wt 221 lb (100.245 kg)  SpO2 97% Body mass index is 29.16 kg/(m^2).  Wt Readings  from Last 3 Encounters:  09/28/14 221 lb (100.245 kg)  06/28/14 219 lb (99.338 kg)  11/26/13 225 lb (102.059 kg)   Constitutional: normal weight, in NAD Eyes: PERRLA, EOMI, no exophthalmos ENT: moist mucous membranes, no thyromegaly, no cervical lymphadenopathy Cardiovascular: RRR, No MRG Respiratory: CTA B Gastrointestinal: abdomen soft, NT, ND, BS+ Musculoskeletal: no deformities, strength intact in all 4 Skin: moist, warm, faint rash on left forearm Neurological: no tremor with outstretched  hands, DTR normal in all 4  ASSESSMENT: 1. DM2, insulin-dependent, uncontrolled, with complications - diabetic retinopathy - CKD, mild proteinuria - peripheral neuropathy-on Lyrica  PLAN:  1. Pt with brittle DM - most likely LADA (based on his wide sugar fluctuations, his body habitus, increase sensitivity to insulin, development of DKA if not covered with insulin for 24 hours).   - he is not bolusing ~50% time as he forgets! He is also not bolusing for snacks. We did discuss that we cannot get good control of his sugars if he does not bolus... Patient Instructions  Please continue the current regimen: Lantus 15 units in am. Novolog mealtime insulin  - normal meal: 7 units  - large meal: 11 units  Use 5 units with snack/meal at bedtime.  NovoLog liding scale:  - 150-175: + 1 unit  - 176-200: + 2 units  - 201-225: + 3 units  - 226-250: + 4 units  - >250: + 5 units Do not inject the sliding scale at bedtime, unless >300, and then inject half of the above doses.   Please try not to skip insulin doses!  Please stop at the lab.  - up to date with annual eye exams  - will check a hemoglobin A1c today, also BMP, Lipids - will see him back in 3 mo  Office Visit on 09/28/2014  Component Date Value Ref Range Status  . Hemoglobin A1C 09/28/2014 11.4* 4.6 - 6.5 % Final   Glycemic Control Guidelines for People with Diabetes:Non Diabetic:  <6%Goal of Therapy: <7%Additional Action Suggested:  >8%   . Sodium 09/28/2014 139  135 - 145 mEq/L Final  . Potassium 09/28/2014 3.7  3.5 - 5.1 mEq/L Final  . Chloride 09/28/2014 106  96 - 112 mEq/L Final  . CO2 09/28/2014 29  19 - 32 mEq/L Final  . Glucose, Bld 09/28/2014 128* 70 - 99 mg/dL Final  . BUN 09/28/2014 18  6 - 23 mg/dL Final  . Creatinine, Ser 09/28/2014 0.9  0.4 - 1.5 mg/dL Final  . Calcium 09/28/2014 9.1  8.4 - 10.5 mg/dL Final  . GFR 09/28/2014 100.19  >60.00 mL/min Final  . Cholesterol 09/28/2014 151  0 - 200 mg/dL Final   ATP  III Classification       Desirable:  < 200 mg/dL               Borderline High:  200 - 239 mg/dL          High:  > = 240 mg/dL  . Triglycerides 09/28/2014 84.0  0.0 - 149.0 mg/dL Final   Normal:  <150 mg/dLBorderline High:  150 - 199 mg/dL  . HDL 09/28/2014 57.40  >39.00 mg/dL Final  . VLDL 09/28/2014 16.8  0.0 - 40.0 mg/dL Final  . LDL Cholesterol 09/28/2014 77  0 - 99 mg/dL Final  . Total CHOL/HDL Ratio 09/28/2014 3   Final                  Men  Women1/2 Average Risk     3.4          3.3Average Risk          5.0          4.42X Average Risk          9.6          7.13X Average Risk          15.0          11.0                      . NonHDL 09/28/2014 93.60   Final   NOTE:  Non-HDL goal should be 30 mg/dL higher than patient's LDL goal (i.e. LDL goal of < 70 mg/dL, would have non-HDL goal of < 100 mg/dL)   The HbA1c is still high, as expected. The rest of the labs are great.

## 2014-12-01 ENCOUNTER — Other Ambulatory Visit: Payer: Self-pay | Admitting: Internal Medicine

## 2014-12-30 ENCOUNTER — Encounter: Payer: Self-pay | Admitting: Internal Medicine

## 2014-12-30 ENCOUNTER — Ambulatory Visit (INDEPENDENT_AMBULATORY_CARE_PROVIDER_SITE_OTHER): Payer: Medicare Other | Admitting: Internal Medicine

## 2014-12-30 VITALS — BP 122/72 | HR 73 | Temp 97.8°F | Resp 12 | Wt 232.0 lb

## 2014-12-30 DIAGNOSIS — E1040 Type 1 diabetes mellitus with diabetic neuropathy, unspecified: Secondary | ICD-10-CM

## 2014-12-30 DIAGNOSIS — E1165 Type 2 diabetes mellitus with hyperglycemia: Secondary | ICD-10-CM | POA: Diagnosis not present

## 2014-12-30 DIAGNOSIS — E1140 Type 2 diabetes mellitus with diabetic neuropathy, unspecified: Secondary | ICD-10-CM

## 2014-12-30 DIAGNOSIS — Z794 Long term (current) use of insulin: Secondary | ICD-10-CM

## 2014-12-30 DIAGNOSIS — E11319 Type 2 diabetes mellitus with unspecified diabetic retinopathy without macular edema: Secondary | ICD-10-CM

## 2014-12-30 DIAGNOSIS — E1122 Type 2 diabetes mellitus with diabetic chronic kidney disease: Secondary | ICD-10-CM

## 2014-12-30 DIAGNOSIS — N189 Chronic kidney disease, unspecified: Secondary | ICD-10-CM | POA: Diagnosis not present

## 2014-12-30 LAB — HEMOGLOBIN A1C: Hgb A1c MFr Bld: 11.6 % — ABNORMAL HIGH (ref 4.6–6.5)

## 2014-12-30 MED ORDER — INSULIN GLARGINE 100 UNIT/ML SOLOSTAR PEN: 22.0000 [IU] | PEN_INJECTOR | Freq: Every day | SUBCUTANEOUS | Status: DC

## 2014-12-30 MED ORDER — INSULIN LISPRO 100 UNIT/ML (KWIKPEN)
7.0000 [IU] | PEN_INJECTOR | Freq: Three times a day (TID) | SUBCUTANEOUS | Status: DC
Start: 2014-12-30 — End: 2015-09-20

## 2014-12-30 NOTE — Patient Instructions (Addendum)
Please continue: Lantus 22 units in am. Switch from NovoLog to Humalog - bolus 15 min before every meal and also before snacks  - snack: 7 units - normal meal: 12 units  - large meal: 16 units  Continue NovoLog sliding scale:  - 150-175: + 1 unit  - 176-200: + 2 units  - 201-225: + 3 units  - 226-250: + 4 units  - >250: + 5 units Do not inject the sliding scale at bedtime, unless >300, and then inject half of the above doses.   Please stop at the lab.  Please return in 3 months with your sugar log.

## 2014-12-30 NOTE — Progress Notes (Signed)
Patient ID: Dylan Quinn, male   DOB: Aug 26, 1936, 79 y.o.   MRN: [id number]  HPI: Dylan Quinn is a 79 y.o.-year-old male, returning for f/u of DM2, dx 2007, insulin-dependent, uncontrolled, with complications (severe hypoglycemia, widely fluctuating sugars, recent admission for DKA, diabetic retinopathy, CKD, peripheral neuropathy, mild proteinuria). Last visit approximately 3 months ago.   Patient and his wife (also a patient of mine) live in [place] ([place] She accompanies him today and offers part of the hx.  He was on insulin pump from 2005-2009. Does not want to go back to one.  Last hemoglobin A1c was: Lab Results  Component Value Date   HGBA1C 11.4* 09/28/2014   HGBA1C 12.0* 06/28/2014   HGBA1C 10.5* 11/26/2013   Patient has a history of DKA, and also severe hypoglycemia, lowest CBG was 18 at night, not recently.   He is on the following insulin regimen: Lantus 22 units in am. Novolog mealtime insulin - he forgets to bolus when he eats out (not frequently now) - normal meal: 10 units  - large meal: 14-15 units  Use 5 units with snack/meal at bedtime >> not taking! NovoLog sliding scale:  - 150-175: + 1 unit  - 176-200: + 2 units  - 201-225: + 3 units  - 226-250: + 4 units  - >250: + 5 units Do not inject the sliding scale at bedtime, unless >300, and then inject half of the above doses.   Pt checks his sugars 3x a day and they are (brings sugar log) - no lows except 1x in the 50s. Sugars a little better. "My sugars can be better if I stopped the [employer] sweet tea and the midnight snacks (a bowl of cereals, cake, chips)" - am: 49 x 1, 56 x1-329, but most 100s >> 66 x1, 154-279, 300s >> sugars higher if eats late at night: 147-347 - lunch: 186-379 - bedtime: 300s-400s >> 181-HI, mostly 300s >> 169, 300's-400s >> 51, 193-402 (takes 5-10 units at bedtime) No more severe lows. Lowest sugar was 18 (see above) but not recently >> now only 1x51;  he has hypoglycemia  awareness at 79. Most recent low was 49 - after took 8 units of NovoLog at bedtime.  Pt 's last BUN/creatinine was:  Lab Results  Component Value Date   BUN 18 09/28/2014   CREATININE 0.9 09/28/2014  He is on Lisinopril. He is seeing nephrology. Last set of lipids: Lab Results  Component Value Date   CHOL 151 09/28/2014   HDL 57.40 09/28/2014   LDLCALC 77 09/28/2014   TRIG 84.0 09/28/2014   CHOLHDL 3 09/28/2014   Pt's last eye exam was in 08/2014. + DR - has Laser Sx Denies numbness and tingling in his legs.  PMH: Patient also has a history of anemia, hypogonadism, glaucoma, hypoacusis, BPH, hypertension, osteoporosis, lumbar radiculopathy, history of urinary calculus, psoriasis.  ROS: Constitutional: no weight gain, no fatigue, no subjective hyperthermia/hypothermia Eyes: no blurry vision, no xerophthalmia ENT: no sore throat, no nodules palpated in throat, no dysphagia/odynophagia, no hoarseness, + decreased hearing Cardiovascular: no CP/SOB/palpitations/leg swelling Respiratory: no cough/SOB Gastrointestinal: no N/V/D/C Musculoskeletal: no muscle/joint aches Skin: no rash Neurological: no tremors/numbness/tingling/dizziness, no HA  I reviewed pt's medications, allergies, PMH, social hx, family hx, and changes were documented in the history of present illness. Otherwise, unchanged from my initial visit note.  PE: BP 122/72 mmHg  Pulse 73  Temp(Src) 97.8 F (36.6 C) (Oral)  Resp 12  Wt 232 lb (105.235 kg)  SpO2 97% Body mass index is 30.62 kg/(m^2).  Wt Readings from Last 3 Encounters:  12/30/14 232 lb (105.235 kg)  09/28/14 221 lb (100.245 kg)  06/28/14 219 lb (99.338 kg)   Constitutional: normal weight, in NAD Eyes: PERRLA, EOMI, no exophthalmos ENT: moist mucous membranes, no thyromegaly, no cervical lymphadenopathy; + erythematous tympanic membranes Cardiovascular: RRR, No MRG Respiratory: CTA B Gastrointestinal: abdomen soft, NT, ND, BS+ Musculoskeletal:  no deformities, strength intact in all 4 Skin: moist, warm, faint rash on left forearm Neurological: no tremor with outstretched hands, DTR normal in all 4  ASSESSMENT: 1. DM2, insulin-dependent, uncontrolled, with complications - diabetic retinopathy - CKD, mild proteinuria - peripheral neuropathy-on Lyrica  PLAN:  1. Pt with brittle DM - most likely LADA (based on his wide sugar fluctuations, his body habitus, increase sensitivity to insulin, development of DKA if not covered with insulin for 24 hours).   Barriers to progress: Noncompliance with insulin boluses Erratic diet, including many sweets, snacks and full meals at night  >> none covered with insulin H/o severe hypoglycemia  -  We again discussed with him and wife that we cannot get good control of his sugars if he does not bolus. Will increase his boluses a little since no lows  Patient Instructions  Please continue: Lantus 22 units in am. Switch from NovoLog to Humalog - bolus 15 min before every meal and also before snacks  - snack: 7 units - normal meal: 12 units  - large meal: 16 units  Continue Humalog sliding scale:  - 150-175: + 1 unit  - 176-200: + 2 units  - 201-225: + 3 units  - 226-250: + 4 units  - >250: + 5 units Do not inject the sliding scale at bedtime, unless >300, and then inject half of the above doses.   Please stop at the lab.  Please return in 3 months with your sugar log.   - up to date with annual eye exams  - will check a hemoglobin A1c today - had to switch to Humalog 2/2 insurance coverage - will see him back in 3 mo  - time spent with the patient: 40 min, of which >50% was spent in reviewing his sugar log , discussing his hypo- and hyper-glycemic episodes, reviewing  previous labs and insulin doses and developing a plan to avoid hypo- and hyper-glycemia. Again did my best to reinforce compliance.  Office Visit on 12/30/2014  Component Date Value Ref Range Status  . Hgb A1c MFr Bld  12/30/2014 11.6* 4.6 - 6.5 % Final   Glycemic Control Guidelines for People with Diabetes:Non Diabetic:  <6%Goal of Therapy: <7%Additional Action Suggested:  >8%    HbA1c about the same.

## 2015-01-25 DIAGNOSIS — N2581 Secondary hyperparathyroidism of renal origin: Secondary | ICD-10-CM | POA: Diagnosis not present

## 2015-01-25 DIAGNOSIS — R809 Proteinuria, unspecified: Secondary | ICD-10-CM | POA: Diagnosis not present

## 2015-01-25 DIAGNOSIS — I10 Essential (primary) hypertension: Secondary | ICD-10-CM | POA: Diagnosis not present

## 2015-01-25 DIAGNOSIS — N400 Enlarged prostate without lower urinary tract symptoms: Secondary | ICD-10-CM | POA: Diagnosis not present

## 2015-01-25 DIAGNOSIS — E1129 Type 2 diabetes mellitus with other diabetic kidney complication: Secondary | ICD-10-CM | POA: Diagnosis not present

## 2015-01-26 DIAGNOSIS — H4011X3 Primary open-angle glaucoma, severe stage: Secondary | ICD-10-CM | POA: Diagnosis not present

## 2015-02-09 DIAGNOSIS — E669 Obesity, unspecified: Secondary | ICD-10-CM | POA: Diagnosis not present

## 2015-02-09 DIAGNOSIS — I10 Essential (primary) hypertension: Secondary | ICD-10-CM | POA: Diagnosis not present

## 2015-02-09 DIAGNOSIS — R972 Elevated prostate specific antigen [PSA]: Secondary | ICD-10-CM | POA: Diagnosis not present

## 2015-02-09 DIAGNOSIS — N401 Enlarged prostate with lower urinary tract symptoms: Secondary | ICD-10-CM | POA: Diagnosis not present

## 2015-02-14 DIAGNOSIS — N521 Erectile dysfunction due to diseases classified elsewhere: Secondary | ICD-10-CM | POA: Diagnosis not present

## 2015-02-14 DIAGNOSIS — E1169 Type 2 diabetes mellitus with other specified complication: Secondary | ICD-10-CM | POA: Diagnosis not present

## 2015-02-14 DIAGNOSIS — R972 Elevated prostate specific antigen [PSA]: Secondary | ICD-10-CM | POA: Diagnosis not present

## 2015-02-18 DIAGNOSIS — I10 Essential (primary) hypertension: Secondary | ICD-10-CM | POA: Diagnosis not present

## 2015-02-18 DIAGNOSIS — E1365 Other specified diabetes mellitus with hyperglycemia: Secondary | ICD-10-CM | POA: Diagnosis not present

## 2015-02-18 DIAGNOSIS — N400 Enlarged prostate without lower urinary tract symptoms: Secondary | ICD-10-CM | POA: Diagnosis not present

## 2015-02-28 DIAGNOSIS — N528 Other male erectile dysfunction: Secondary | ICD-10-CM | POA: Diagnosis not present

## 2015-02-28 DIAGNOSIS — R972 Elevated prostate specific antigen [PSA]: Secondary | ICD-10-CM | POA: Diagnosis not present

## 2015-03-07 DIAGNOSIS — E109 Type 1 diabetes mellitus without complications: Secondary | ICD-10-CM | POA: Diagnosis not present

## 2015-03-31 ENCOUNTER — Encounter: Payer: Self-pay | Admitting: Internal Medicine

## 2015-03-31 ENCOUNTER — Ambulatory Visit (INDEPENDENT_AMBULATORY_CARE_PROVIDER_SITE_OTHER): Payer: TRICARE For Life (TFL) | Admitting: Internal Medicine

## 2015-03-31 VITALS — BP 118/68 | HR 75 | Temp 98.6°F | Resp 12 | Wt 234.0 lb

## 2015-03-31 DIAGNOSIS — E1040 Type 1 diabetes mellitus with diabetic neuropathy, unspecified: Secondary | ICD-10-CM | POA: Diagnosis not present

## 2015-03-31 LAB — HEMOGLOBIN A1C: HEMOGLOBIN A1C: 11.4 % — AB (ref 4.6–6.5)

## 2015-03-31 NOTE — Patient Instructions (Signed)
Please continue: Lantus 22 units in am. Continue Humalog - bolus 15 min before every meal and also before snacks  - snack: 7 units - normal meal: 12 units  - large meal: 16 units  Continue Humalog sliding scale:  - 150-175: + 1 unit  - 176-200: + 2 units  - 201-225: + 3 units  - 226-250: + 4 units  - >250: + 5 units Do not inject the sliding scale at bedtime, unless >300, and then inject half of the above doses.   Please stop at the lab.  Please return in 3 months with your sugar log.

## 2015-03-31 NOTE — Progress Notes (Signed)
Patient ID: STEFFEN HASE, male   DOB: 08/17/1936, 79 y.o.   MRN: [id number]  HPI: MAHLON GABRIELLE is a 79 y.o.-year-old male, returning for f/u of DM2, dx 2007, insulin-dependent, uncontrolled, with complications (severe hypoglycemia, widely fluctuating sugars, recent admission for DKA, diabetic retinopathy, CKD, peripheral neuropathy, mild proteinuria). Last visit approximately 3 months ago.   Patient and his wife (also a patient of mine) live in [place] ([place] She accompanies him today and offers part of the hx.  He was on insulin pump from 2005-2009. Does not want to go back to one.  Last hemoglobin A1c was: Lab Results  Component Value Date   HGBA1C 11.6* 12/30/2014   HGBA1C 11.4* 09/28/2014   HGBA1C 12.0* 06/28/2014   Patient has a history of DKA, and also severe hypoglycemia, lowest CBG was 18 at night, not recently.   He is on the following insulin regimen: Lantus 22 units in am. Humalog:  - snack: 7 units - normal meal: 12 units  - large meal: 16 units  Continue Humalog sliding scale:  - 150-175: + 1 unit  - 176-200: + 2 units  - 201-225: + 3 units  - 226-250: + 4 units  - >250: + 5 units Do not inject the sliding scale at bedtime, unless >300, and then inject half of the above doses.    Pt checks his sugars 2-3x a day and they are (brings sugar log) - no lows except 1x in the 50s. Sugars terrible. At last visit, he told me: "My sugars can be better if I stopped the [employer] sweet tea and the midnight snacks (a bowl of cereals, cake, chips)"  He tells me: "I don't think I need to change anything" - am: 49 x 1, 56 x1-329, but most 100s >> 66 x1, 154-279, 300s >> 147-347 >> 56, 84, 119, 159, 163-313, 347 - lunch: 186-379 >> 196-439 - before dinner: 321-495 - bedtime: 181-HI, mostly 300s >> 169, 300's-400s >> 51, 193-402 (takes 5-10 units at bedtime) >> 120-502 No more severe lows. Lowest sugar was 18 (see above) but not recently >> now only 1x56;  he has  hypoglycemia awareness at 77.  Pt 's last BUN/creatinine was:  Lab Results  Component Value Date   BUN 18 09/28/2014   CREATININE 0.9 09/28/2014  He is on Lisinopril. He is seeing nephrology. Last set of lipids: Lab Results  Component Value Date   CHOL 151 09/28/2014   HDL 57.40 09/28/2014   LDLCALC 77 09/28/2014   TRIG 84.0 09/28/2014   CHOLHDL 3 09/28/2014   Pt's last eye exam was in 08/2014. + DR - has Laser Sx Denies numbness and tingling in his legs.  PMH: Patient also has a history of anemia, hypogonadism, glaucoma, hypoacusis, BPH, hypertension, osteoporosis, lumbar radiculopathy, history of urinary calculus, psoriasis.  ROS: Constitutional: no weight gain, no fatigue, no subjective hyperthermia/hypothermia Eyes: no blurry vision, no xerophthalmia ENT: no sore throat, no nodules palpated in throat, no dysphagia/odynophagia, no hoarseness, + decreased hearing Cardiovascular: no CP/SOB/palpitations/leg swelling Respiratory: no cough/SOB Gastrointestinal: no N/V/D/C Musculoskeletal: no muscle/+ joint aches Skin: no rash Neurological: no tremors/numbness/tingling/dizziness, no HA + low libido + diff with erections  I reviewed pt's medications, allergies, PMH, social hx, family hx, and changes were documented in the history of present illness. Otherwise, unchanged from my initial visit note.  PE: BP 118/68 mmHg  Pulse 75  Temp(Src) 98.6 F (37 C) (Oral)  Resp 12  Wt 234 lb (106.142 kg)  SpO2  97% Body mass index is 30.88 kg/(m^2).  Wt Readings from Last 3 Encounters:  03/31/15 234 lb (106.142 kg)  12/30/14 232 lb (105.235 kg)  09/28/14 221 lb (100.245 kg)   Constitutional: normal weight, in NAD Eyes: PERRLA, EOMI, no exophthalmos ENT: moist mucous membranes, no thyromegaly, no cervical lymphadenopathy Cardiovascular: RRR, No MRG, + mild periankle swelling Respiratory: CTA B Gastrointestinal: abdomen soft, NT, ND, BS+ Musculoskeletal: no deformities,  strength intact in all 4 Skin: moist, warm, faint rash on left forearm Neurological: no tremor with outstretched hands, DTR normal in all 4  ASSESSMENT: 1. LADA, insulin-dependent, uncontrolled, with complications - diabetic retinopathy - CKD, mild proteinuria - peripheral neuropathy-on Lyrica  PLAN:  1. Pt with brittle DM - most likely LADA (based on his wide sugar fluctuations, his body habitus, increase sensitivity to insulin, development of DKA if not covered with insulin for 24 hours).   Barriers to progress: Noncompliance with insulin boluses Erratic diet, including many sweets, snacks and full meals at night  >> none covered with insulin H/o severe hypoglycemia  -  We again discussed with him and wife that we cannot get good control of his sugars until he improves his diet and bolusing. He begs me not to change anything in his regimen now. I agreed, but he needs to promise me he will try to get control on his diet and bolusing for 1 week before he comes back next time.  Patient Instructions  Please continue: Lantus 22 units in am. Continue Humalog - bolus 15 min before every meal and also before snacks  - snack: 7 units - normal meal: 12 units  - large meal: 16 units  Continue Humalog sliding scale:  - 150-175: + 1 unit  - 176-200: + 2 units  - 201-225: + 3 units  - 226-250: + 4 units  - >250: + 5 units Do not inject the sliding scale at bedtime, unless >300, and then inject half of the above doses.   Please stop at the lab.  Please return in 3 months with your sugar log.   - Again did my best to reinforce compliance. - up to date with annual eye exams  - will check a hemoglobin A1c today - will see him back in 3 mo  Office Visit on 03/31/2015  Component Date Value Ref Range Status  . Hgb A1c MFr Bld 03/31/2015 11.4* 4.6 - 6.5 % Final   Glycemic Control Guidelines for People with Diabetes:Non Diabetic:  <6%Goal of Therapy: <7%Additional Action Suggested:  >8%     HbA1c still high, as expected.

## 2015-04-14 DIAGNOSIS — H4011X3 Primary open-angle glaucoma, severe stage: Secondary | ICD-10-CM | POA: Diagnosis not present

## 2015-04-28 ENCOUNTER — Encounter: Payer: Self-pay | Admitting: Internal Medicine

## 2015-05-13 DIAGNOSIS — N183 Chronic kidney disease, stage 3 (moderate): Secondary | ICD-10-CM | POA: Diagnosis not present

## 2015-05-13 DIAGNOSIS — R809 Proteinuria, unspecified: Secondary | ICD-10-CM | POA: Diagnosis not present

## 2015-05-13 DIAGNOSIS — I10 Essential (primary) hypertension: Secondary | ICD-10-CM | POA: Diagnosis not present

## 2015-05-13 DIAGNOSIS — D649 Anemia, unspecified: Secondary | ICD-10-CM | POA: Diagnosis not present

## 2015-05-13 DIAGNOSIS — E1129 Type 2 diabetes mellitus with other diabetic kidney complication: Secondary | ICD-10-CM | POA: Diagnosis not present

## 2015-06-08 DIAGNOSIS — I10 Essential (primary) hypertension: Secondary | ICD-10-CM | POA: Diagnosis not present

## 2015-06-08 DIAGNOSIS — E109 Type 1 diabetes mellitus without complications: Secondary | ICD-10-CM | POA: Diagnosis not present

## 2015-06-08 DIAGNOSIS — H409 Unspecified glaucoma: Secondary | ICD-10-CM | POA: Diagnosis not present

## 2015-06-08 DIAGNOSIS — N400 Enlarged prostate without lower urinary tract symptoms: Secondary | ICD-10-CM | POA: Diagnosis not present

## 2015-06-08 DIAGNOSIS — E119 Type 2 diabetes mellitus without complications: Secondary | ICD-10-CM | POA: Diagnosis not present

## 2015-06-20 ENCOUNTER — Other Ambulatory Visit: Payer: Self-pay

## 2015-07-08 DIAGNOSIS — I10 Essential (primary) hypertension: Secondary | ICD-10-CM | POA: Diagnosis not present

## 2015-07-08 DIAGNOSIS — N400 Enlarged prostate without lower urinary tract symptoms: Secondary | ICD-10-CM | POA: Diagnosis not present

## 2015-07-08 DIAGNOSIS — E1165 Type 2 diabetes mellitus with hyperglycemia: Secondary | ICD-10-CM | POA: Diagnosis not present

## 2015-07-08 DIAGNOSIS — H409 Unspecified glaucoma: Secondary | ICD-10-CM | POA: Diagnosis not present

## 2015-07-14 ENCOUNTER — Ambulatory Visit: Payer: Self-pay | Admitting: Urology

## 2015-07-19 DIAGNOSIS — E1169 Type 2 diabetes mellitus with other specified complication: Secondary | ICD-10-CM | POA: Diagnosis not present

## 2015-07-19 DIAGNOSIS — R972 Elevated prostate specific antigen [PSA]: Secondary | ICD-10-CM | POA: Diagnosis not present

## 2015-07-19 DIAGNOSIS — N521 Erectile dysfunction due to diseases classified elsewhere: Secondary | ICD-10-CM | POA: Diagnosis not present

## 2015-07-25 ENCOUNTER — Ambulatory Visit: Payer: TRICARE For Life (TFL) | Admitting: Internal Medicine

## 2015-08-05 ENCOUNTER — Encounter: Payer: Self-pay | Admitting: Internal Medicine

## 2015-08-05 ENCOUNTER — Other Ambulatory Visit (INDEPENDENT_AMBULATORY_CARE_PROVIDER_SITE_OTHER): Payer: TRICARE For Life (TFL) | Admitting: *Deleted

## 2015-08-05 ENCOUNTER — Ambulatory Visit (INDEPENDENT_AMBULATORY_CARE_PROVIDER_SITE_OTHER): Payer: TRICARE For Life (TFL) | Admitting: Internal Medicine

## 2015-08-05 VITALS — BP 118/72 | HR 71 | Temp 98.0°F | Resp 12 | Wt 218.0 lb

## 2015-08-05 DIAGNOSIS — E1040 Type 1 diabetes mellitus with diabetic neuropathy, unspecified: Secondary | ICD-10-CM | POA: Diagnosis not present

## 2015-08-05 LAB — POCT GLYCOSYLATED HEMOGLOBIN (HGB A1C): HEMOGLOBIN A1C: 10.3

## 2015-08-05 MED ORDER — INSULIN GLARGINE 100 UNIT/ML SOLOSTAR PEN: 27.0000 [IU] | PEN_INJECTOR | Freq: Every day | SUBCUTANEOUS | Status: DC

## 2015-08-05 NOTE — Patient Instructions (Addendum)
Please continue: Lantus 27 units in am, but decrease to 24 units as soon as you start the Paleo diet.  Continue Humalog - bolus 15 min before every meal and also before snacks  - snack: 7 units - normal meal: 12 units  - large meal: 16 units  Continue Humalog sliding scale:  - 150-175: + 1 unit  - 176-200: + 2 units  - 201-225: + 3 units  - 226-250: + 4 units  - >250: + 5 units Do not inject the sliding scale at bedtime, unless >300, and then inject half of the above doses.   Please return in 3 months with your sugar log.   Please schedule an appt with Leonia Reader for diabetes education.

## 2015-08-05 NOTE — Progress Notes (Signed)
Patient ID: Dylan Quinn, male   DOB: 07-07-1936, 79 y.o.   MRN: [id number]  HPI: Dylan Quinn is a 79 y.o.-year-old male, returning for f/u of DM2, dx 2007, insulin-dependent, uncontrolled, with complications (severe hypoglycemia, widely fluctuating sugars, recent admission for DKA, diabetic retinopathy, CKD, peripheral neuropathy, mild proteinuria). Last visit approximately 4 months ago.   Patient and his wife (also a patient of mine) live in [place] ([place] She accompanies him today and offers part of the hx.  He was on insulin pump from 2005-2009. Does not want to go back to one.  He was on a Paleo diet before >> sugars much better >> but fell off the wagon 06/2015.   Last hemoglobin A1c was: Lab Results  Component Value Date   HGBA1C 11.4* 03/31/2015   HGBA1C 11.6* 12/30/2014   HGBA1C 11.4* 09/28/2014   Patient has a history of DKA, and also severe hypoglycemia, lowest CBG was 18 at night, not recently.   He is on the following insulin regimen: Lantus 22 >> 27 units in am (does not take Lantus if sugars <130) Humalog:  - snack: 7 units - normal meal: 12 units  - large meal: 16 units  Continue Humalog sliding scale:  - 150-175: + 1 unit  - 176-200: + 2 units  - 201-225: + 3 units  - 226-250: + 4 units  - >250: + 5 units Do not inject the sliding scale at bedtime, unless >300, and then inject half of the above doses.   Pt checks his sugars 2-3x a day and they are (brings sugar log) - no lows except 1x in the 51. Sugars still terrible. At last visit, he told me: "My sugars can be better if I stopped the [employer] sweet tea and the midnight snacks (a bowl of cereals, cake, chips)"  He will get back on the diet soon. He tells me: - am: 66 x1, 154-279, 300s >> 147-347 >> 56, 84, 119, 159, 163-313, 347 >> 51, 146-391 - lunch: 186-379 >> 196-439 >> 98-349 - before dinner: 321-495 >> 78, 128-440 - bedtime: 169, 300's-400s >> 51, 193-402 (takes 5-10 units at bedtime)  >> 120-502 >> n/c >> 316-HI No more severe lows. Lowest sugar was 18 (see above) but not recently >> now only 1x51;  he has hypoglycemia awareness at 75.  Pt 's last BUN/creatinine was:  Lab Results  Component Value Date   BUN 18 09/28/2014   CREATININE 0.9 09/28/2014  He is on Lisinopril. He is seeing nephrology. Last set of lipids: Lab Results  Component Value Date   CHOL 151 09/28/2014   HDL 57.40 09/28/2014   LDLCALC 77 09/28/2014   TRIG 84.0 09/28/2014   CHOLHDL 3 09/28/2014   Pt's last eye exam was in 08/2014. + DR - has Laser Sx Denies numbness and tingling in his legs.  PMH: Patient also has a history of anemia, hypogonadism, glaucoma, hypoacusis, BPH, hypertension, osteoporosis, lumbar radiculopathy, history of urinary calculus, psoriasis.  ROS: Constitutional: no weight gain, no fatigue, no subjective hyperthermia/hypothermia Eyes: no blurry vision, no xerophthalmia ENT: no sore throat, no nodules palpated in throat, no dysphagia/odynophagia, no hoarseness, + decreased hearing Cardiovascular: no CP/SOB/palpitations/leg swelling Respiratory: no cough/SOB Gastrointestinal: no N/V/D/C Musculoskeletal: no muscle/+ joint aches Skin: no rash Neurological: no tremors/numbness/tingling/dizziness, no HA  I reviewed pt's medications, allergies, PMH, social hx, family hx, and changes were documented in the history of present illness. Otherwise, unchanged from my initial visit note.  PE: BP 118/72 mmHg  Pulse 71  Temp(Src) 98 F (36.7 C) (Oral)  Resp 12  Wt 218 lb (98.884 kg)  SpO2 97% Body mass index is 28.77 kg/(m^2).  Wt Readings from Last 3 Encounters:  08/05/15 218 lb (98.884 kg)  03/31/15 234 lb (106.142 kg)  12/30/14 232 lb (105.235 kg)   Constitutional: normal weight, in NAD Eyes: PERRLA, EOMI, no exophthalmos ENT: moist mucous membranes, no thyromegaly, no cervical lymphadenopathy Cardiovascular: RRR, No MRG, + mild periankle swelling Respiratory: CTA  B Gastrointestinal: abdomen soft, NT, ND, BS+ Musculoskeletal: no deformities, strength intact in all 4 Skin: moist, warm, faint rash on left forearm Neurological: no tremor with outstretched hands, DTR normal in all 4  ASSESSMENT: 1. LADA, insulin-dependent, uncontrolled, with complications - diabetic retinopathy - CKD, mild proteinuria - peripheral neuropathy-on Lyrica  PLAN:  1. Pt with brittle DM - most likely LADA (based on his wide sugar fluctuations, his body habitus, increase sensitivity to insulin, development of DKA if not covered with insulin for 24 hours).   Barriers to progress: Noncompliance with insulin boluses Erratic diet, including many sweets, snacks and full meals at night  >> none covered with insulin H/o severe hypoglycemia  -  We again discussed with him and wife that we cannot get good control of his sugars until he improves his diet and bolusing. He will start back on the Paleo diet (low carb). Patient Instructions  Please continue: Lantus 27 units in am, but decrease to 24 units as soon as you start the Paleo diet.  Continue Humalog - bolus 15 min before every meal and also before snacks  - snack: 7 units - normal meal: 12 units  - large meal: 16 units  Continue Humalog sliding scale:  - 150-175: + 1 unit  - 176-200: + 2 units  - 201-225: + 3 units  - 226-250: + 4 units  - >250: + 5 units Do not inject the sliding scale at bedtime, unless >300, and then inject half of the above doses.   Please return in 3 months with your sugar log.   - Again did my best to reinforce compliance. - up to date with annual eye exams  - will check a hemoglobin A1c today >> 10.3% (lower) - he tells me now he would like to know more about a pump >> discussed briefly about them >> also referred to DM edu - will see him back in 3 mo

## 2015-08-08 ENCOUNTER — Ambulatory Visit: Payer: Self-pay | Admitting: Urology

## 2015-08-09 ENCOUNTER — Encounter: Payer: TRICARE For Life (TFL) | Admitting: Nutrition

## 2015-08-09 ENCOUNTER — Encounter: Payer: Medicare Other | Attending: Internal Medicine | Admitting: Nutrition

## 2015-08-09 DIAGNOSIS — E1040 Type 1 diabetes mellitus with diabetic neuropathy, unspecified: Secondary | ICD-10-CM | POA: Diagnosis not present

## 2015-08-09 DIAGNOSIS — Z713 Dietary counseling and surveillance: Secondary | ICD-10-CM | POA: Diagnosis not present

## 2015-08-09 NOTE — Progress Notes (Signed)
Dylan Quinn is here today to discuss what he is wanting to do to get his blood sugars under better control.  He says that his family wants him to go back on the Paleo diet, and is starting to do this, but doesn't like it.  He is angry that he is now retired, and he can not eat what he wants and control his blood sugars better.  His wife does not want him to go back on the pump, because he says that it went very high, and he did not know what to do, and ended up in the hospital "with a very high blood sugar".  He says that he is tired of taking injections and would prefer to be back on the pump, but is not sure what he should do.  He says he did count carbs, but it was "a lot of trouble", and was not sure if he was doing it correctly.  He would prefer not to do this.    We discussed the advantages and disadvantages of insulin pump therapy, and he wanted me to narrow his choices to only 2 pumps.  He was given brochures on the [place] and the Medtronic(his old pump).  He liked the display of the [place] pump.  We discussed the CGM, the fact that it was not covered under his Medicare coverage.  He promised to read over the brochure and to call if questions.  He says that he would like to bring his wife in to discuss this therapy, and I suggested he do that.    We also discussed the need to eat a balanced meal, and what that would consist of, to keep his blood sugars from swinging from High to low between meals.  He reported good understanding of this.  We discussed what happens when he drinks sweet drinks, and the fact that the insulin does not work well to cover this rapid rise, and that this would then require additional insulin, that will cause a low blood sugar 4 hours later.  He agreed with this and agreed to stop drinking sweet tea from [employer].   He did not bring his meter, but says that his blood sugars are better since seeing Dr. Cruzita Lederer    He continues to eat late at night--sometimes to prevent low  blood sugars in the AM, and some times he over eats, and does not take extra Novolog for this.  We discussed how much he would need to eat to keep his blood sugars from dropping, what to do when he overeats.

## 2015-08-09 NOTE — Patient Instructions (Signed)
Make sure that each meal has some carbohydrate, protein and small amount of fat.   Read over literature on the [facility] and Medtronic insulin pumps.   Decide what type of therapy you will want to do (pump or injections), and come in next week with your blood sugars and wife, to discuss your decision and next steps Test before meals and bedtime.  Write down what meals you ate when readings are over 200.

## 2015-08-23 DIAGNOSIS — R972 Elevated prostate specific antigen [PSA]: Secondary | ICD-10-CM | POA: Diagnosis not present

## 2015-08-23 DIAGNOSIS — E1169 Type 2 diabetes mellitus with other specified complication: Secondary | ICD-10-CM | POA: Diagnosis not present

## 2015-08-23 DIAGNOSIS — N521 Erectile dysfunction due to diseases classified elsewhere: Secondary | ICD-10-CM | POA: Diagnosis not present

## 2015-09-08 DIAGNOSIS — E1040 Type 1 diabetes mellitus with diabetic neuropathy, unspecified: Secondary | ICD-10-CM | POA: Diagnosis not present

## 2015-09-13 DIAGNOSIS — R972 Elevated prostate specific antigen [PSA]: Secondary | ICD-10-CM | POA: Diagnosis not present

## 2015-09-18 DIAGNOSIS — C61 Malignant neoplasm of prostate: Secondary | ICD-10-CM | POA: Diagnosis not present

## 2015-09-20 ENCOUNTER — Other Ambulatory Visit: Payer: Self-pay | Admitting: Internal Medicine

## 2015-09-23 ENCOUNTER — Telehealth: Payer: Self-pay | Admitting: Internal Medicine

## 2015-09-23 NOTE — Telephone Encounter (Signed)
Please read message below and advise.  

## 2015-09-23 NOTE — Telephone Encounter (Signed)
I cannot do that. Diabetes is not a reason for not being on [place] duty.

## 2015-09-23 NOTE — Telephone Encounter (Signed)
Called and spoke with pt's wife. She said that pt had a prostate biopsy and he is unable to sit for long periods of time. Advised her that he will need to get a letter from his Urologist. She voiced understanding.

## 2015-09-23 NOTE — Telephone Encounter (Signed)
Needs a letter regarding jury duty at the end of the month, can we write him a letter to be dismissed from this

## 2015-09-27 DIAGNOSIS — C61 Malignant neoplasm of prostate: Secondary | ICD-10-CM | POA: Diagnosis not present

## 2015-11-07 ENCOUNTER — Ambulatory Visit: Payer: TRICARE For Life (TFL) | Admitting: Internal Medicine

## 2015-11-08 ENCOUNTER — Encounter: Payer: Self-pay | Admitting: Radiation Oncology

## 2015-11-08 ENCOUNTER — Ambulatory Visit: Payer: No Typology Code available for payment source | Admitting: Radiation Oncology

## 2015-11-08 ENCOUNTER — Ambulatory Visit: Payer: No Typology Code available for payment source | Attending: Physical Medicine & Rehabilitation

## 2015-11-08 DIAGNOSIS — Z794 Long term (current) use of insulin: Secondary | ICD-10-CM | POA: Insufficient documentation

## 2015-11-08 DIAGNOSIS — E119 Type 2 diabetes mellitus without complications: Secondary | ICD-10-CM | POA: Insufficient documentation

## 2015-11-08 DIAGNOSIS — C61 Malignant neoplasm of prostate: Secondary | ICD-10-CM | POA: Insufficient documentation

## 2015-11-08 DIAGNOSIS — I10 Essential (primary) hypertension: Secondary | ICD-10-CM | POA: Insufficient documentation

## 2015-11-08 NOTE — Consults (Signed)
November 08, 2015     Dr. Nonda Lou  [address]  [address]  [place], [place] [address]     Dear Dr. Graciela Husbands,     Thank you for asking me to see [name] in radiation oncology  consultation.  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       I had the pleasure of meeting him and his Roy Delgado (as well as Roy Delgado via  phone) today at [facility] radiation oncology to discuss  potential radiation options.     HISTORY OF PRESENT ILLNESS:  This is a 79 year old black man who on routine PSA screening in [place], was found that it elevated PSA about 8 ng/mL.  At the time he had  no symptoms aside from urinary frequency and some decreased energy.  He was  referred to urology for further evaluation and workup.  PSA, February 09, 2015 was 8.3.  PSA, July 08, 2015 was 8.7.  He was seen by Dr. Nonda Lou  of urology and underwent transrectal ultrasound-guided biopsy of the  prostate September 13, 2015, which demonstrated prostate adenocarcinoma in  multiple cores.  On the left, there was Gleason 3+4 at the left base, 26%;  left apex, Gleason 3+4, 40%; left lateral apex, Gleason 4+3, 70%.  The  remaining 3 left cores were benign.  On the right, there was Gleason 3+4,  5% at the right lateral base; and Gleason 3+3, 5% involving the right  lateral mid gland.  The remaining 4 right cores were benign.     Following his diagnosis, [name] discussed potential treatment options  with Dr. Graciela Husbands.  Surgery was not felt to be ideal given [name]  advanced age and medical comorbidities.  Accordingly, he was referred to  radiation oncology for discussion of further options.  At present, Mr.  Delgado reports ongoing urinary frequency and decreased energy.  His IPSS  score today is 16, notable for frequency and urgency approximately half the  time as well of  nocturia x3.  He has incomplete emptying, intermittency and  weak stream less than half the time.  He denies any problems with pain  anywhere.  No problems with rectal discomfort, diarrhea, loose stools.  No  pain in the arms, legs, or back.  He has a good appetite.  His SHIM score  today is 1.     PAST RADIATION HISTORY:  None.     PAST MEDICAL HISTORY:  1.  Glaucoma.  2.  Diabetes.  3.  Hypertension.     FAMILY HISTORY:  1.  Negative for any known prostate cancer.  2.  His father was diagnosed with colon cancer at age 70.     SOCIAL HISTORY:  He is a married gentleman who is here today with his Roy Delgado.  He lives in  [place], [place] currently, but is staying with his Roy Delgado in  [place], [place].  He would like to receive treatment in [place].   He denies smoking or previous treatment for drug/alcohol addiction.  The  patient has occasional alcohol.     MEDICATIONS:  Lyrica, hydrochlorothiazide, Benicar, Lantus, Humalog, glaucoma eyedrops.     ALLERGIES:  No known drug allergies.     REVIEW OF SYSTEMS:  Per HPI above.  Negative except as noted above. Per HPI above. Comprehensive review of systems,  including constitutional,eyes, ears, nose, mouth, throat, cardiovascular, gastrointestinal, genitourinary, musculoskeletal, integumentary, respiratory, neurologic, psychiatric, and endocrine, is negative other than what is mentioned already in the history of present illness.     PHYSICAL EXAMINATION:  GENERAL:  Zubrod score 0, in no apparent distress.  HEENT:  Eyes anicteric.  Mucous membranes moist.  NECK:  Supple.  RESPIRATORY:  Effort is even and unlabored.  ABDOMEN:  Digital rectal examination demonstrates normal-appearing perianal  region, intact sphincter tone, intact rectal mucosa.  Prostate feels  smooth, symmetrical without palpable masses or nodules.     LABORATORY DATA:  Studies from July 08, 2015, show creatinine 1.1.  White count 3.4; hemoglobin 12.6; platelets 199,000.       Prior PSAs  include the following:  1.  January 27, 2014, PSA 4.3.  2.  October 19, 2014, PSA 8.4.  3.  January 25, 2015, PSA 8.5.    4.  February 09, 2015, PSA 8.3  5.  July 08, 2015, PSA 8.7.     PATHOLOGY:  Per HPI above.    RADIOLOGY:  Per HPI above.     ASSESSMENT AND RECOMMENDATION:  [name] is a 79 year old gentleman with unfavorable intermediate risk  prostate adenocarcinoma with presenting PSA 8.7, Gleason 4+3=7 with 42%  positive biopsy cores, clinical T1c Nx Mx. Per the [facility]  nomogram, his probability of extra prostatic extension is 65%, seminal  vesicle involvement 9%, lymph node involvement 10%.      Appropriate management options, taking into account his elevated IPSS pain score of 16, include the following, as reflected in the 2016 NCCN Guidelines:    1. External beam radiation therapy with short-term androgen deprivation therapy.    2. External beam radiation monotherapy.      The option of surgery is not ideal given his advanced age and comorbidities, per Dr. Graciela Husbands.      Given his elevated IPSS score of 16, I do not consider him an appropriate brachytherapy candidate given that brachytherapy may exacerbate his urinary symptoms score significantly.  Accordingly, [name] for appropriate treatment would include external beam radiation therapy, plus/minus androgen deprivation therapy.  I would recommend that [name] discuss with Dr. Graciela Husbands the option of all short-term 4 to 6 month androgen deprivation therapy, to be given before, during and after radiation therapy.       I discussed with  [name], his Roy Delgado and Roy Delgado indications for, benefits/risks of, and logistics of radiation for his disease.  We discussed options of conventionally fractionated radiation therapy, hypofractionated radiation therapy, CyberKnife, proton therapy.  Of these approaches, I discussed with [name] and his family that conventionally fractionated external beam x-ray therapy is best  supported by literature with a proven track record and well known, tolerated side effect profile.  We did discuss newer data supporting the other approaches above, but overall, I do recommend conventionally fractionated course of radiation therapy for [name] in addition to consideration of short-term androgen deprivation therapy.      We discussed that conventionally fractionated radiation therapy for his unfavorable intermediate risk prostate cancer would involve intensity modulated radiation therapy, IMRT, targeting the prostate and proximal seminal vesicles given once daily over the course of 8 to 9 weeks.  We discussed placement of gold fiducial markers in the prostate by radiology and/or urology to improve safety and precision of daily treatments.  This would be done prior to starting radiation therapy.  We discussed the radiation oncology simulation session in  preparation for treatment planning.  We discussed that radiation should be initiated 8 to 10 weeks following the first androgen deprivation therapy treatment.     We discussed possible side effects of radiation therapy for prostate cancer  including but not limited to fatigue, urinary bother, rectal bother,  chronic diarrhea, increased risk of bony hip fracture, secondary  malignancy, urinary obstruction/incontinence, further erectile dysfunction.     Following the above discussion, [name] indicated understanding of the  benefits and risks, a willingness to proceed with combined short-term  androgen deprivation therapy and external beam radiation therapy for  definitive treatment of his unfavorable intermediate risk prostate  adenocarcinoma.  I advised that he should contact Dr. Odessa Fleming office to  schedule an appointment regarding ADT.      I discussed with [name] that he should come back for gold fiducial marker placement in the prostate approximately 6 to 7 weeks after his first ADT dose, followed by radiation oncology simulation  planning scan 1 week later, with the goal starting radiation treatment 8 to 10 weeks after the first ADT shot.       Following the above discussion [name] indicated understanding of the  benefits and risks.  His questions were answered.  He expressed a  willingness to proceed with radiation therapy and provided written signed  IAH consent.    I had the opportunity to discuss his case today with Dr. Graciela Husbands as well and we agreed on the plan of care for Roy Delgado.Marland Kitchen      PLAN:  1.  Today [name] learned about potential radiation options for his  unfavorable, intermediate  risk prostate adenocarcinoma.  2.  He has consented for external beam radiation therapy and plans to make  an appointment to see Dr. Graciela Husbands regarding the addition of short-term  androgen deprivation therapy.  3.  [name] knows to follow up with Dr. Graciela Husbands regarding ADT.  4.  [name] knows to call with his first ADT date so that we may  schedule his gold fiducial marker placement and prostate simulation  planning scan.  5.  Anticipate 8 to 9 total weeks of daily external beam radiation therapy  beginning in mid-January 2017.     Thank you again for involving me in the care of this pleasant patient.    Sincerely,    Leandra Kern, M.D., Ph.D.   [facility]     Over the course of about 60 minutes, the majority of which was spent in direct patient counseling and coordination of care, I reviewed the potential rationale, risks and benefits of radiation for the patient's disease.

## 2015-11-08 NOTE — Progress Notes (Cosign Needed)
Subjective:       Patient ID: Roy Delgado. is a 79 y.o. male.    HPI        Review of Systems   Constitutional: Positive for fatigue. Negative for fever, chills, diaphoresis, activity change, appetite change and unexpected weight change.   HENT: Negative.    Eyes: Negative.    Respiratory: Negative.    Cardiovascular: Positive for leg swelling. Negative for chest pain and palpitations.   Gastrointestinal: Negative.    Endocrine: Negative.    Genitourinary: Positive for frequency. Negative for dysuria, hematuria, flank pain, discharge, penile swelling, scrotal swelling, enuresis, difficulty urinating, genital sores, penile pain and testicular pain.   Musculoskeletal: Negative.    Skin: Positive for rash.   Allergic/Immunologic: Negative.    Neurological: Negative.    Hematological: Negative.    Psychiatric/Behavioral: Negative.            Objective:    Physical Exam        Assessment:             Plan:      Procedures

## 2015-11-14 DIAGNOSIS — C61 Malignant neoplasm of prostate: Secondary | ICD-10-CM | POA: Diagnosis not present

## 2015-12-09 ENCOUNTER — Encounter: Payer: Self-pay | Admitting: Internal Medicine

## 2015-12-09 ENCOUNTER — Telehealth: Payer: Self-pay | Admitting: Internal Medicine

## 2015-12-09 ENCOUNTER — Ambulatory Visit (INDEPENDENT_AMBULATORY_CARE_PROVIDER_SITE_OTHER): Payer: Medicare Other | Admitting: Internal Medicine

## 2015-12-09 VITALS — BP 158/60 | HR 79 | Temp 98.9°F | Resp 18 | Ht 72.0 in | Wt 222.0 lb

## 2015-12-09 DIAGNOSIS — J069 Acute upper respiratory infection, unspecified: Secondary | ICD-10-CM

## 2015-12-09 MED ORDER — LIDOCAINE VISCOUS 2 % MT SOLN: 10.0000 mL | OROMUCOSAL | Status: DC | PRN

## 2015-12-09 MED ORDER — BENZONATATE 200 MG PO CAPS: 200.0000 mg | ORAL_CAPSULE | Freq: Three times a day (TID) | ORAL | Status: DC | PRN

## 2015-12-09 NOTE — Telephone Encounter (Signed)
Rec'd from [place] forward 3 pages to [facility]

## 2015-12-09 NOTE — Progress Notes (Signed)
   Subjective:    Patient ID: Dylan Quinn, male    DOB: [birth year], 79 y.o.   MRN: [id number]  HPI The patient is a 79 YO man coming in for cough. Started 2-3 days ago. Accompanied by sore throat. No fevers or chills. No SOB. Not productive. Some nasal drainage. Recently traveled on plane and thinks he was exposed to sick people. His wife was concerned as he is currently undergoing treatment for his prostate with shot and they were worried this could cause the sore throat.   Review of Systems  Constitutional: Negative for fever, chills, activity change, appetite change, fatigue and unexpected weight change.  HENT: Positive for congestion, rhinorrhea and sore throat. Negative for ear discharge, ear pain, nosebleeds, postnasal drip and sinus pressure.   Eyes: Negative.   Respiratory: Positive for cough. Negative for chest tightness, shortness of breath and wheezing.   Cardiovascular: Negative for chest pain, palpitations and leg swelling.  Gastrointestinal: Negative.   Musculoskeletal: Negative.   Neurological: Negative.       Objective:   Physical Exam  Constitutional: He is oriented to person, place, and time. He appears well-developed and well-nourished.  HENT:  Head: Normocephalic and atraumatic.  Right Ear: External ear normal.  Left Ear: External ear normal.  Oropharynx red with clear drainage. No cervical LAD.   Neck: Normal range of motion.  Cardiovascular: Normal rate and regular rhythm.   Pulmonary/Chest: Effort normal and breath sounds normal. No respiratory distress. He has no wheezes. He has no rales.  Lymphadenopathy:    He has no cervical adenopathy.  Neurological: He is alert and oriented to person, place, and time.  Skin: Skin is warm and dry.   Filed Vitals:   12/09/15 1352  BP: 158/60  Pulse: 79  Temp: 98.9 F (37.2 C)  TempSrc: Oral  Resp: 18  Height: 6' (1.829 m)  Weight: 222 lb (100.699 kg)  SpO2: 95%      Assessment & Plan:

## 2015-12-09 NOTE — Telephone Encounter (Signed)
Pt just saw Dr. Sharlet Salina, pt stated that record from [place] just send to our office to day. Making sure that Record is with [place] because he is seeing Dr. Ronnald Ramp on Monday.

## 2015-12-09 NOTE — Assessment & Plan Note (Signed)
Rx for lidocaine viscous for sore throat prn. Also rx for tessalon perles for cough to help with his symptoms. No indication for antibiotics and talked about symptomatic measures. Discussed typical course of symptoms.

## 2015-12-09 NOTE — Progress Notes (Signed)
Pre visit review using our clinic review tool, if applicable. No additional management support is needed unless otherwise documented below in the visit note. 

## 2015-12-09 NOTE — Patient Instructions (Signed)
We have given you the prescription for the numbing medicine. It is called lidocaine and you can use 5-10 mL to swish in your mouth and swallow for pain as needed.   The other medicine is tessalon perles that you can use up to 3 times per day to help keep you from coughing.   You do not need antibiotics today and typically viruses last about 7-10 days.   Upper Respiratory Infection, Adult Most upper respiratory infections (URIs) are a viral infection of the air passages leading to the lungs. A URI affects the nose, throat, and upper air passages. The most common type of URI is nasopharyngitis and is typically referred to as "the common cold." URIs run their course and usually go away on their own. Most of the time, a URI does not require medical attention, but sometimes a bacterial infection in the upper airways can follow a viral infection. This is called a secondary infection. Sinus and middle ear infections are common types of secondary upper respiratory infections. Bacterial pneumonia can also complicate a URI. A URI can worsen asthma and chronic obstructive pulmonary disease (COPD). Sometimes, these complications can require emergency medical care and may be life threatening.  CAUSES Almost all URIs are caused by viruses. A virus is a type of germ and can spread from one person to another.  RISKS FACTORS You may be at risk for a URI if:   You smoke.   You have chronic heart or lung disease.  You have a weakened defense (immune) system.   You are very young or very old.   You have nasal allergies or asthma.  You work in crowded or poorly ventilated areas.  You work in health care facilities or schools. SIGNS AND SYMPTOMS  Symptoms typically develop 2-3 days after you come in contact with a cold virus. Most viral URIs last 7-10 days. However, viral URIs from the influenza virus (flu virus) can last 14-18 days and are typically more severe. Symptoms may include:   Runny or stuffy  (congested) nose.   Sneezing.   Cough.   Sore throat.   Headache.   Fatigue.   Fever.   Loss of appetite.   Pain in your forehead, behind your eyes, and over your cheekbones (sinus pain).  Muscle aches.  DIAGNOSIS  Your health care provider may diagnose a URI by:  Physical exam.  Tests to check that your symptoms are not due to another condition such as:  Strep throat.  Sinusitis.  Pneumonia.  Asthma. TREATMENT  A URI goes away on its own with time. It cannot be cured with medicines, but medicines may be prescribed or recommended to relieve symptoms. Medicines may help:  Reduce your fever.  Reduce your cough.  Relieve nasal congestion. HOME CARE INSTRUCTIONS   Take medicines only as directed by your health care provider.   Gargle warm saltwater or take cough drops to comfort your throat as directed by your health care provider.  Use a warm mist humidifier or inhale steam from a shower to increase air moisture. This may make it easier to breathe.  Drink enough fluid to keep your urine clear or pale yellow.   Eat soups and other clear broths and maintain good nutrition.   Rest as needed.   Return to work when your temperature has returned to normal or as your health care provider advises. You may need to stay home longer to avoid infecting others. You can also use a face mask and careful  hand washing to prevent spread of the virus.  Increase the usage of your inhaler if you have asthma.   Do not use any tobacco products, including cigarettes, chewing tobacco, or electronic cigarettes. If you need help quitting, ask your health care provider. PREVENTION  The best way to protect yourself from getting a cold is to practice good hygiene.   Avoid oral or hand contact with people with cold symptoms.   Wash your hands often if contact occurs.  There is no clear evidence that vitamin C, vitamin E, echinacea, or exercise reduces the chance of  developing a cold. However, it is always recommended to get plenty of rest, exercise, and practice good nutrition.  SEEK MEDICAL CARE IF:   You are getting worse rather than better.   Your symptoms are not controlled by medicine.   You have chills.  You have worsening shortness of breath.  You have brown or red mucus.  You have yellow or brown nasal discharge.  You have pain in your face, especially when you bend forward.  You have a fever.  You have swollen neck glands.  You have pain while swallowing.  You have white areas in the back of your throat. SEEK IMMEDIATE MEDICAL CARE IF:   You have severe or persistent:  Headache.  Ear pain.  Sinus pain.  Chest pain.  You have chronic lung disease and any of the following:  Wheezing.  Prolonged cough.  Coughing up blood.  A change in your usual mucus.  You have a stiff neck.  You have changes in your:  Vision.  Hearing.  Thinking.  Mood. MAKE SURE YOU:   Understand these instructions.  Will watch your condition.  Will get help right away if you are not doing well or get worse.   This information is not intended to replace advice given to you by your health care provider. Make sure you discuss any questions you have with your health care provider.   Document Released: 06/05/2001 Document Revised: 04/26/2015 Document Reviewed: 03/17/2014 Elsevier Interactive Patient Education [employer].

## 2015-12-12 ENCOUNTER — Other Ambulatory Visit (INDEPENDENT_AMBULATORY_CARE_PROVIDER_SITE_OTHER): Payer: TRICARE For Life (TFL)

## 2015-12-12 ENCOUNTER — Ambulatory Visit (INDEPENDENT_AMBULATORY_CARE_PROVIDER_SITE_OTHER): Payer: Medicare Other | Admitting: Internal Medicine

## 2015-12-12 ENCOUNTER — Encounter: Payer: Self-pay | Admitting: Internal Medicine

## 2015-12-12 ENCOUNTER — Ambulatory Visit (INDEPENDENT_AMBULATORY_CARE_PROVIDER_SITE_OTHER)
Admission: RE | Admit: 2015-12-12 | Discharge: 2015-12-12 | Disposition: A | Discharge status: Home / Self Care | Payer: Medicare Other | Source: Ambulatory Visit | Attending: Internal Medicine | Admitting: Internal Medicine

## 2015-12-12 VITALS — BP 138/88 | HR 87 | Temp 97.6°F | Resp 16 | Ht 72.0 in | Wt 223.5 lb

## 2015-12-12 DIAGNOSIS — R809 Proteinuria, unspecified: Secondary | ICD-10-CM

## 2015-12-12 DIAGNOSIS — R059 Cough, unspecified: Secondary | ICD-10-CM

## 2015-12-12 DIAGNOSIS — E1040 Type 1 diabetes mellitus with diabetic neuropathy, unspecified: Secondary | ICD-10-CM

## 2015-12-12 DIAGNOSIS — I10 Essential (primary) hypertension: Secondary | ICD-10-CM

## 2015-12-12 DIAGNOSIS — D649 Anemia, unspecified: Secondary | ICD-10-CM | POA: Diagnosis not present

## 2015-12-12 DIAGNOSIS — R05 Cough: Secondary | ICD-10-CM | POA: Diagnosis not present

## 2015-12-12 DIAGNOSIS — E1021 Type 1 diabetes mellitus with diabetic nephropathy: Secondary | ICD-10-CM

## 2015-12-12 LAB — CBC WITH DIFFERENTIAL/PLATELET
BASOS PCT: 0.7 % (ref 0.0–3.0)
Basophils Absolute: 0 10*3/uL (ref 0.0–0.1)
EOS ABS: 0.2 10*3/uL (ref 0.0–0.7)
EOS PCT: 6.2 % — AB (ref 0.0–5.0)
HEMATOCRIT: 40.3 % (ref 39.0–52.0)
HEMOGLOBIN: 13.3 g/dL (ref 13.0–17.0)
LYMPHS PCT: 23.4 % (ref 12.0–46.0)
Lymphs Abs: 0.9 10*3/uL (ref 0.7–4.0)
MCHC: 33 g/dL (ref 30.0–36.0)
MCV: 90.2 fl (ref 78.0–100.0)
MONOS PCT: 14.2 % — AB (ref 3.0–12.0)
Monocytes Absolute: 0.5 10*3/uL (ref 0.1–1.0)
NEUTROS ABS: 2.1 10*3/uL (ref 1.4–7.7)
Neutrophils Relative %: 55.5 % (ref 43.0–77.0)
PLATELETS: 208 10*3/uL (ref 150.0–400.0)
RBC: 4.47 Mil/uL (ref 4.22–5.81)
RDW: 14.7 % (ref 11.5–15.5)
WBC: 3.9 10*3/uL — AB (ref 4.0–10.5)

## 2015-12-12 LAB — URINALYSIS, ROUTINE W REFLEX MICROSCOPIC
Ketones, ur: 15 — AB
LEUKOCYTES UA: NEGATIVE
Nitrite: NEGATIVE
PH: 5.5 (ref 5.0–8.0)
RBC / HPF: NONE SEEN (ref 0–?)
Specific Gravity, Urine: 1.025 (ref 1.000–1.030)
TOTAL PROTEIN, URINE-UPE24: 100 — AB
Urine Glucose: >=1000 — AB
Urobilinogen, UA: 0.2 (ref 0.0–1.0)
WBC, UA: NONE SEEN (ref 0–?)

## 2015-12-12 LAB — HEMOGLOBIN A1C: Hgb A1c MFr Bld: 10.2 % — ABNORMAL HIGH (ref 4.6–6.5)

## 2015-12-12 LAB — MICROALBUMIN / CREATININE URINE RATIO
CREATININE, U: 242.5 mg/dL
MICROALB UR: 57.5 mg/dL — AB (ref 0.0–1.9)
MICROALB/CREAT RATIO: 23.7 mg/g (ref 0.0–30.0)

## 2015-12-12 MED ORDER — PREGABALIN 100 MG PO CAPS: 100.0000 mg | ORAL_CAPSULE | Freq: Two times a day (BID) | ORAL | Status: AC

## 2015-12-12 MED ORDER — HYDROCHLOROTHIAZIDE 25 MG PO TABS: 25.0000 mg | ORAL_TABLET | Freq: Every day | ORAL | Status: DC

## 2015-12-12 MED ORDER — HYDROCOD POLST-CPM POLST ER 10-8 MG/5ML PO SUER
5.0000 mL | Freq: Two times a day (BID) | ORAL | Status: DC | PRN
Start: 2015-12-12 — End: 2015-12-29

## 2015-12-12 MED ORDER — OLMESARTAN MEDOXOMIL 40 MG PO TABS: 40.0000 mg | ORAL_TABLET | Freq: Every day | ORAL | Status: AC

## 2015-12-12 NOTE — Progress Notes (Signed)
Subjective:  Patient ID: Dylan Quinn, male    DOB: July 16, 1936  Age: 79 y.o. MRN: [id number]  CC: Cough and Diabetes   HPI Dylan Quinn presents for a follow-up on cough and diabetes. He complains of a cough productive of opaque, white thick phlegm for about a week. He is also noticed night sweats, chills and shortness of breath. He has had no fever. He complains of fatigue and weakness. He was prescribed Tessalon Perles approximately 3 days ago but he tells me that they were too expensive so has not been taking them. He also had a sore throat so he used lidocaine swish and swallow but he didn't like the taste of it.  Outpatient Prescriptions Prior to Visit  Medication Sig Dispense Refill  . aspirin EC 81 MG tablet Take 81 mg by mouth daily. Reported on 12/09/2015    . brimonidine-timolol (COMBIGAN) 0.2-0.5 % ophthalmic solution Place 1 drop into both eyes every 12 (twelve) hours.    . clobetasol cream (TEMOVATE) 0.05 %     . dorzolamide (TRUSOPT) 2 % ophthalmic solution Place 1 drop into both eyes 2 (two) times daily.    Marland Kitchen glucagon (GLUCAGON EMERGENCY) 1 MG injection Inject 1 mg into the muscle once as needed. 2 each 1  . glucose blood (ONE TOUCH ULTRA TEST) test strip Use as instructed six times a day. Dx code: 250.01, variable glucoses. 200 each 4  . Insulin Pen Needle (BD PEN NEEDLE NANO U/F) 32G X 4 MM MISC Use as directed four times daily.  Dx code: 250.61 400 each 2  . latanoprost (XALATAN) 0.005 % ophthalmic solution Place 1 drop into both eyes at bedtime.    . Vitamin D, Ergocalciferol, (DRISDOL) 50000 UNITS CAPS capsule Reported on 12/09/2015  0  . HUMALOG KWIKPEN 100 UNIT/ML KiwkPen INJECT 0.07-0.15 MLS (7-15 UNITS TOTAL) INTO THE SKIN 3 (THREE) TIMES DAILY. 45 pen 1  . hydrochlorothiazide (HYDRODIURIL) 25 MG tablet Take 25 mg by mouth daily.   3  . Insulin Glargine (LANTUS SOLOSTAR) 100 UNIT/ML Solostar Pen Inject 27 Units into the skin daily. 15 mL 2  . LANTUS SOLOSTAR  100 UNIT/ML Solostar Pen INJECT 15 UNITS INTO THE SKIN DAILY. 15 pen 2  . olmesartan (BENICAR) 40 MG tablet Take 1 tablet (40 mg total) by mouth daily. 70 tablet 0  . pregabalin (LYRICA) 100 MG capsule Take 1 capsule (100 mg total) by mouth 2 (two) times daily. 180 capsule 1  . benzonatate (TESSALON) 200 MG capsule Take 1 capsule (200 mg total) by mouth 3 (three) times daily as needed for cough. (Patient not taking: Reported on 12/12/2015) 60 capsule 0  . lidocaine (XYLOCAINE) 2 % solution Use as directed 10 mLs in the mouth or throat as needed for mouth pain. (Patient not taking: Reported on 12/12/2015) 100 mL 0   No facility-administered medications prior to visit.    ROS Review of Systems  Constitutional: Positive for chills. Negative for fever, diaphoresis, activity change, appetite change, fatigue and unexpected weight change.  HENT: Negative.  Negative for congestion, sinus pressure, trouble swallowing and voice change.   Eyes: Negative.   Respiratory: Positive for cough and shortness of breath. Negative for apnea, choking, chest tightness, wheezing and stridor.   Cardiovascular: Negative.  Negative for chest pain, palpitations and leg swelling.  Gastrointestinal: Negative.  Negative for nausea, vomiting, abdominal pain, diarrhea, constipation and blood in stool.  Endocrine: Negative.   Genitourinary: Negative.  Negative for difficulty urinating.  Musculoskeletal: Negative.   Skin: Negative.   Allergic/Immunologic: Negative.   Neurological: Negative.  Negative for dizziness, tremors, syncope and light-headedness.  Hematological: Negative.  Negative for adenopathy. Does not bruise/bleed easily.  Psychiatric/Behavioral: Negative.     Objective:  BP 138/88 mmHg  Pulse 87  Temp(Src) 97.6 F (36.4 C) (Oral)  Resp 16  Ht 6' (1.829 m)  Wt 223 lb 8 oz (101.379 kg)  BMI 30.31 kg/m2  SpO2 97%  BP Readings from Last 3 Encounters:  12/12/15 138/88  12/09/15 158/60  08/05/15 118/72      Wt Readings from Last 3 Encounters:  12/12/15 223 lb 8 oz (101.379 kg)  12/09/15 222 lb (100.699 kg)  08/05/15 218 lb (98.884 kg)    Physical Exam  Constitutional: He is oriented to person, place, and time.  Non-toxic appearance. He does not have a sickly appearance. He does not appear ill. No distress.  HENT:  Mouth/Throat: Oropharynx is clear and moist. No oropharyngeal exudate.  Eyes: Conjunctivae are normal. Right eye exhibits no discharge. Left eye exhibits no discharge. No scleral icterus.  Neck: Normal range of motion. Neck supple. No JVD present. No tracheal deviation present. No thyromegaly present.  Cardiovascular: Normal rate, regular rhythm, normal heart sounds and intact distal pulses.  Exam reveals no gallop and no friction rub.   No murmur heard. Pulmonary/Chest: Effort normal and breath sounds normal. No stridor. No respiratory distress. He has no wheezes. He has no rales. He exhibits no tenderness.  Abdominal: Soft. Bowel sounds are normal. He exhibits no distension and no mass. There is no tenderness. There is no rebound and no guarding.  Musculoskeletal: Normal range of motion. He exhibits no edema or tenderness.  Lymphadenopathy:    He has no cervical adenopathy.  Neurological: He is oriented to person, place, and time.  Skin: Skin is warm and dry. No rash noted. He is not diaphoretic. No erythema. No pallor.  Psychiatric: He has a normal mood and affect. His behavior is normal. Judgment and thought content normal.    Lab Results  Component Value Date   WBC 3.9* 12/12/2015   HGB 13.3 12/12/2015   HCT 40.3 12/12/2015   PLT 208.0 12/12/2015   GLUCOSE 290* 12/12/2015   CHOL 122 12/12/2015   TRIG 91.0 12/12/2015   HDL 44.50 12/12/2015   LDLCALC 59 12/12/2015   ALT 7 05/04/2013   AST 8 05/04/2013   NA 137 12/12/2015   K 3.8 12/12/2015   CL 102 12/12/2015   CREATININE 1.02 12/12/2015   BUN 11 12/12/2015   CO2 27 12/12/2015   TSH 0.53 12/12/2015   PSA  3.80 01/13/2013   HGBA1C 10.2* 12/12/2015   MICROALBUR 57.5* 12/12/2015    Dg Chest Portable 1 View  05/04/2013  *RADIOLOGY REPORT* Clinical Data: Cough, abdominal pain, nausea, vomiting, history hypertension, diabetes PORTABLE CHEST - 1 VIEW Comparison: Portable exam 0808 hours compared to 03/10/2010 Findings: Mild elevation of left diaphragm. Normal heart size and pulmonary vascularity. Tortuous thoracic aorta. Lungs clear. No pleural effusion or pneumothorax. No acute osseous findings. IMPRESSION: No acute abnormalities. Original Report Authenticated By: Lavonia Dana, M.D.    Assessment & Plan:   Dylan Quinn was seen today for cough and diabetes.  Diagnoses and all orders for this visit:  Essential hypertension- his blood pressures well controlled, lites and renal function are stable. -     CBC with Differential/Platelet; Future -     Basic metabolic panel; Future -     TSH; Future -  olmesartan (BENICAR) 40 MG tablet; Take 1 tablet (40 mg total) by mouth daily. -     hydrochlorothiazide (HYDRODIURIL) 25 MG tablet; Take 1 tablet (25 mg total) by mouth daily.  Normocytic anemia- improvement noted -     CBC with Differential/Platelet; Future  PROTEINURIA, MILD- this is stable, will continue the ARB. -     Basic metabolic panel; Future -     Urinalysis, Routine w reflex microscopic (not at [facility]); Future -     Microalbumin / creatinine urine ratio; Future -     olmesartan (BENICAR) 40 MG tablet; Take 1 tablet (40 mg total) by mouth daily.  Type 1 diabetes mellitus with diabetic neuropathy ([place])- his blood sugars are not well-controlled, there are trace ketones in his urine today, I've asked him to increase his doses of his basal and quick acting insulins, I've also asked him to follow-up with endocrinology as soon as possible. -     Lipid panel; Future -     Basic metabolic panel; Future -     Hemoglobin A1c; Future -     pregabalin (LYRICA) 100 MG capsule; Take 1 capsule (100 mg total)  by mouth 2 (two) times daily. -     Insulin Glargine (LANTUS SOLOSTAR) 100 UNIT/ML Solostar Pen; Inject 35 Units into the skin daily. -     insulin lispro (HUMALOG KWIKPEN) 100 UNIT/ML KiwkPen; INJECT 0.07-0.15 MLS (7-15 UNITS TOTAL) INTO THE SKIN 3 (THREE) TIMES DAILY. -     Ambulatory referral to Endocrinology  Type 1 diabetes mellitus with nephropathy ([place])- this is stable, will continue the ARB. -     olmesartan (BENICAR) 40 MG tablet; Take 1 tablet (40 mg total) by mouth daily. -     Insulin Glargine (LANTUS SOLOSTAR) 100 UNIT/ML Solostar Pen; Inject 35 Units into the skin daily. -     insulin lispro (HUMALOG KWIKPEN) 100 UNIT/ML KiwkPen; INJECT 0.07-0.15 MLS (7-15 UNITS TOTAL) INTO THE SKIN 3 (THREE) TIMES DAILY. -     Ambulatory referral to Endocrinology  Cough- his exam and chest x-ray are normal, I believe he has a viral upper respiratory infection, will treat the symptoms with Tussionex suspension. -     DG Chest 2 View; Future -     chlorpheniramine-HYDROcodone (TUSSIONEX PENNKINETIC ER) 10-8 MG/5ML SUER; Take 5 mLs by mouth every 12 (twelve) hours as needed for cough.  I have discontinued Mr. Eguchi LANTUS SOLOSTAR, lidocaine, and benzonatate. I have changed his HUMALOG KWIKPEN to insulin lispro. I have also changed his hydrochlorothiazide and Insulin Glargine. Additionally, I am having him start on chlorpheniramine-HYDROcodone. Lastly, I am having him maintain his brimonidine-timolol, dorzolamide, latanoprost, aspirin EC, clobetasol cream, glucose blood, Insulin Pen Needle, glucagon, Vitamin D (Ergocalciferol), olmesartan, and pregabalin.  Meds ordered this encounter  Medications  . olmesartan (BENICAR) 40 MG tablet    Sig: Take 1 tablet (40 mg total) by mouth daily.    Dispense:  90 tablet    Refill:  3  . hydrochlorothiazide (HYDRODIURIL) 25 MG tablet    Sig: Take 1 tablet (25 mg total) by mouth daily.    Dispense:  90 tablet    Refill:  1  . chlorpheniramine-HYDROcodone  (TUSSIONEX PENNKINETIC ER) 10-8 MG/5ML SUER    Sig: Take 5 mLs by mouth every 12 (twelve) hours as needed for cough.    Dispense:  140 mL    Refill:  0  . pregabalin (LYRICA) 100 MG capsule    Sig: Take 1 capsule (100  mg total) by mouth 2 (two) times daily.    Dispense:  180 capsule    Refill:  1  . Insulin Glargine (LANTUS SOLOSTAR) 100 UNIT/ML Solostar Pen    Sig: Inject 35 Units into the skin daily.    Dispense:  15 mL    Refill:  2  . insulin lispro (HUMALOG KWIKPEN) 100 UNIT/ML KiwkPen    Sig: INJECT 0.07-0.15 MLS (7-15 UNITS TOTAL) INTO THE SKIN 3 (THREE) TIMES DAILY.    Dispense:  45 pen    Refill:  1     Follow-up: Return in about 3 weeks (around 01/02/2016).  Scarlette Calico, MD

## 2015-12-12 NOTE — Progress Notes (Signed)
Pre visit review using our clinic review tool, if applicable. No additional management support is needed unless otherwise documented below in the visit note. 

## 2015-12-12 NOTE — Patient Instructions (Signed)

## 2015-12-13 ENCOUNTER — Encounter: Payer: Self-pay | Admitting: Internal Medicine

## 2015-12-13 LAB — BASIC METABOLIC PANEL
BUN: 11 mg/dL (ref 6–23)
CALCIUM: 9 mg/dL (ref 8.4–10.5)
CO2: 27 mEq/L (ref 19–32)
CREATININE: 1.02 mg/dL (ref 0.40–1.50)
Chloride: 102 mEq/L (ref 96–112)
GFR: 90.89 mL/min (ref >60.00)
GLUCOSE: 290 mg/dL — AB (ref 70–99)
Potassium: 3.8 mEq/L (ref 3.5–5.1)
Sodium: 137 mEq/L (ref 135–145)

## 2015-12-13 LAB — LIPID PANEL
CHOLESTEROL: 122 mg/dL (ref 0–200)
HDL: 44.5 mg/dL (ref >39.00)
LDL CALC: 59 mg/dL (ref 0–99)
NonHDL: 77.48
TRIGLYCERIDES: 91 mg/dL (ref 0.0–149.0)
Total CHOL/HDL Ratio: 3
VLDL: 18.2 mg/dL (ref 0.0–40.0)

## 2015-12-13 LAB — TSH: TSH: 0.53 u[IU]/mL (ref 0.35–4.50)

## 2015-12-13 MED ORDER — INSULIN GLARGINE 100 UNIT/ML SOLOSTAR PEN: 35.0000 [IU] | PEN_INJECTOR | Freq: Every day | SUBCUTANEOUS | Status: DC

## 2015-12-13 MED ORDER — INSULIN LISPRO 100 UNIT/ML (KWIKPEN): PEN_INJECTOR | SUBCUTANEOUS | Status: DC

## 2015-12-21 ENCOUNTER — Observation Stay
Admission: EM | Admit: 2015-12-21 | Discharge: 2015-12-22 | Disposition: A | Discharge status: Home / Self Care | Payer: Medicare Other | Attending: Internal Medicine | Admitting: Internal Medicine

## 2015-12-21 ENCOUNTER — Emergency Department: Payer: Medicare Other

## 2015-12-21 DIAGNOSIS — Z87442 Personal history of urinary calculi: Secondary | ICD-10-CM | POA: Insufficient documentation

## 2015-12-21 DIAGNOSIS — J440 Chronic obstructive pulmonary disease with acute lower respiratory infection: Principal | ICD-10-CM | POA: Insufficient documentation

## 2015-12-21 DIAGNOSIS — E119 Type 2 diabetes mellitus without complications: Secondary | ICD-10-CM | POA: Diagnosis not present

## 2015-12-21 DIAGNOSIS — E876 Hypokalemia: Secondary | ICD-10-CM | POA: Diagnosis not present

## 2015-12-21 DIAGNOSIS — Z86010 Personal history of colonic polyps: Secondary | ICD-10-CM | POA: Insufficient documentation

## 2015-12-21 DIAGNOSIS — J209 Acute bronchitis, unspecified: Secondary | ICD-10-CM | POA: Diagnosis not present

## 2015-12-21 DIAGNOSIS — Z794 Long term (current) use of insulin: Secondary | ICD-10-CM | POA: Diagnosis not present

## 2015-12-21 DIAGNOSIS — Z8546 Personal history of malignant neoplasm of prostate: Secondary | ICD-10-CM | POA: Insufficient documentation

## 2015-12-21 DIAGNOSIS — J189 Pneumonia, unspecified organism: Secondary | ICD-10-CM

## 2015-12-21 DIAGNOSIS — I10 Essential (primary) hypertension: Secondary | ICD-10-CM | POA: Diagnosis not present

## 2015-12-21 DIAGNOSIS — N400 Enlarged prostate without lower urinary tract symptoms: Secondary | ICD-10-CM | POA: Diagnosis not present

## 2015-12-21 DIAGNOSIS — E785 Hyperlipidemia, unspecified: Secondary | ICD-10-CM | POA: Insufficient documentation

## 2015-12-21 DIAGNOSIS — J441 Chronic obstructive pulmonary disease with (acute) exacerbation: Secondary | ICD-10-CM | POA: Diagnosis present

## 2015-12-21 DIAGNOSIS — E1165 Type 2 diabetes mellitus with hyperglycemia: Secondary | ICD-10-CM | POA: Diagnosis not present

## 2015-12-21 DIAGNOSIS — R9431 Abnormal electrocardiogram [ECG] [EKG]: Secondary | ICD-10-CM | POA: Diagnosis not present

## 2015-12-21 DIAGNOSIS — J181 Lobar pneumonia, unspecified organism: Secondary | ICD-10-CM

## 2015-12-21 DIAGNOSIS — R42 Dizziness and giddiness: Secondary | ICD-10-CM | POA: Diagnosis not present

## 2015-12-21 DIAGNOSIS — R0602 Shortness of breath: Secondary | ICD-10-CM | POA: Diagnosis not present

## 2015-12-21 DIAGNOSIS — L409 Psoriasis, unspecified: Secondary | ICD-10-CM | POA: Diagnosis not present

## 2015-12-21 DIAGNOSIS — R05 Cough: Secondary | ICD-10-CM | POA: Diagnosis not present

## 2015-12-21 DIAGNOSIS — J449 Chronic obstructive pulmonary disease, unspecified: Secondary | ICD-10-CM | POA: Diagnosis present

## 2015-12-21 LAB — BASIC METABOLIC PANEL
ANION GAP: 10 (ref 5–15)
BUN: 13 mg/dL (ref 6–20)
CHLORIDE: 101 mmol/L (ref 101–111)
CO2: 25 mmol/L (ref 22–32)
CREATININE: 0.96 mg/dL (ref 0.61–1.24)
Calcium: 8.7 mg/dL — ABNORMAL LOW (ref 8.9–10.3)
GFR calc non Af Amer: >60 mL/min (ref >60)
Glucose, Bld: 281 mg/dL — ABNORMAL HIGH (ref 65–99)
POTASSIUM: 3.2 mmol/L — AB (ref 3.5–5.1)
Sodium: 136 mmol/L (ref 135–145)

## 2015-12-21 LAB — URINALYSIS COMPLETE WITH MICROSCOPIC (ARMC ONLY)
BACTERIA UA: NONE SEEN
BILIRUBIN URINE: NEGATIVE
Glucose, UA: 50 mg/dL — AB
Leukocytes, UA: NEGATIVE
Nitrite: NEGATIVE
Protein, ur: 100 mg/dL — AB
Specific Gravity, Urine: 1.025 (ref 1.005–1.030)
pH: 5 (ref 5.0–8.0)

## 2015-12-21 LAB — CBC
HEMATOCRIT: 39 % — AB (ref 40.0–52.0)
HEMOGLOBIN: 12.8 g/dL — AB (ref 13.0–18.0)
MCH: 29.3 pg (ref 26.0–34.0)
MCHC: 32.8 g/dL (ref 32.0–36.0)
MCV: 89.2 fL (ref 80.0–100.0)
Platelets: 260 10*3/uL (ref 150–440)
RBC: 4.37 MIL/uL — AB (ref 4.40–5.90)
RDW: 14 % (ref 11.5–14.5)
WBC: 9 10*3/uL (ref 3.8–10.6)

## 2015-12-21 LAB — MAGNESIUM: MAGNESIUM: 1.7 mg/dL (ref 1.7–2.4)

## 2015-12-21 LAB — GLUCOSE, CAPILLARY
GLUCOSE-CAPILLARY: 226 mg/dL — AB (ref 65–99)
GLUCOSE-CAPILLARY: 130 mg/dL — AB (ref 65–99)
Glucose-Capillary: 261 mg/dL — ABNORMAL HIGH (ref 65–99)

## 2015-12-21 MED ORDER — BRIMONIDINE TARTRATE-TIMOLOL 0.2-0.5 % OP SOLN: 1.0000 [drp] | Freq: Two times a day (BID) | OPHTHALMIC | Status: DC

## 2015-12-21 MED ORDER — HYDROCHLOROTHIAZIDE 25 MG PO TABS
25.0000 mg | ORAL_TABLET | Freq: Every day | ORAL | Status: DC
  Administered 2015-12-22: 25 mg via ORAL
  Filled 2015-12-21: qty 1

## 2015-12-21 MED ORDER — LATANOPROST 0.005 % OP SOLN
1.0000 [drp] | Freq: Every day | OPHTHALMIC | Status: DC
  Administered 2015-12-22: 1 [drp] via OPHTHALMIC
  Filled 2015-12-21: qty 2.5

## 2015-12-21 MED ORDER — BRIMONIDINE TARTRATE 0.2 % OP SOLN
1.0000 [drp] | Freq: Two times a day (BID) | OPHTHALMIC | Status: DC
  Administered 2015-12-22 (×2): 1 [drp] via OPHTHALMIC
  Filled 2015-12-21: qty 5

## 2015-12-21 MED ORDER — METHYLPREDNISOLONE SODIUM SUCC 40 MG IJ SOLR
40.0000 mg | Freq: Three times a day (TID) | INTRAMUSCULAR | Status: DC
  Administered 2015-12-21 – 2015-12-22 (×2): 40 mg via INTRAVENOUS
  Filled 2015-12-21 (×2): qty 1

## 2015-12-21 MED ORDER — TIMOLOL MALEATE 0.5 % OP SOLN
1.0000 [drp] | Freq: Two times a day (BID) | OPHTHALMIC | Status: DC
  Administered 2015-12-22: 1 [drp] via OPHTHALMIC
  Filled 2015-12-21 (×2): qty 5

## 2015-12-21 MED ORDER — AZITHROMYCIN 500 MG IV SOLR
500.0000 mg | INTRAVENOUS | Status: DC
  Filled 2015-12-21: qty 500

## 2015-12-21 MED ORDER — INSULIN ASPART 100 UNIT/ML ~~LOC~~ SOLN: 0.0000 [IU] | Freq: Every day | SUBCUTANEOUS | Status: DC

## 2015-12-21 MED ORDER — BENZONATATE 100 MG PO CAPS
200.0000 mg | ORAL_CAPSULE | Freq: Once | ORAL | Status: AC
  Administered 2015-12-21: 200 mg via ORAL
  Filled 2015-12-21: qty 2

## 2015-12-21 MED ORDER — DORZOLAMIDE HCL 2 % OP SOLN
1.0000 [drp] | Freq: Two times a day (BID) | OPHTHALMIC | Status: DC
  Administered 2015-12-22 (×2): 1 [drp] via OPHTHALMIC
  Filled 2015-12-21: qty 10

## 2015-12-21 MED ORDER — HEPARIN SODIUM (PORCINE) 5000 UNIT/ML IJ SOLN
5000.0000 [IU] | Freq: Three times a day (TID) | INTRAMUSCULAR | Status: DC
  Administered 2015-12-21 – 2015-12-22 (×2): 5000 [IU] via SUBCUTANEOUS
  Filled 2015-12-21 (×2): qty 1

## 2015-12-21 MED ORDER — SODIUM CHLORIDE 0.9 % IJ SOLN: 3.0000 mL | Freq: Two times a day (BID) | INTRAMUSCULAR | Status: DC

## 2015-12-21 MED ORDER — LEVOFLOXACIN IN D5W 750 MG/150ML IV SOLN
750.0000 mg | Freq: Once | INTRAVENOUS | Status: AC
  Administered 2015-12-21: 750 mg via INTRAVENOUS
  Filled 2015-12-21: qty 150

## 2015-12-21 MED ORDER — PREGABALIN 50 MG PO CAPS
100.0000 mg | ORAL_CAPSULE | Freq: Two times a day (BID) | ORAL | Status: DC
  Administered 2015-12-21 – 2015-12-22 (×2): 100 mg via ORAL
  Filled 2015-12-21 (×2): qty 2

## 2015-12-21 MED ORDER — HYDROCOD POLST-CPM POLST ER 10-8 MG/5ML PO SUER: 5.0000 mL | Freq: Two times a day (BID) | ORAL | Status: DC | PRN

## 2015-12-21 MED ORDER — IRBESARTAN 150 MG PO TABS
300.0000 mg | ORAL_TABLET | Freq: Every day | ORAL | Status: DC
  Administered 2015-12-22: 300 mg via ORAL
  Filled 2015-12-21: qty 2

## 2015-12-21 MED ORDER — DEXTROSE 5 % IV SOLN
1.0000 g | INTRAVENOUS | Status: DC
  Administered 2015-12-22: 1 g via INTRAVENOUS
  Filled 2015-12-21 (×2): qty 10

## 2015-12-21 MED ORDER — ALBUTEROL SULFATE (2.5 MG/3ML) 0.083% IN NEBU: 2.5000 mg | INHALATION_SOLUTION | RESPIRATORY_TRACT | Status: DC | PRN

## 2015-12-21 MED ORDER — ONDANSETRON HCL 4 MG/2ML IJ SOLN: 4.0000 mg | Freq: Four times a day (QID) | INTRAMUSCULAR | Status: DC | PRN

## 2015-12-21 MED ORDER — SODIUM CHLORIDE 0.9 % IV SOLN: 250.0000 mL | INTRAVENOUS | Status: DC | PRN

## 2015-12-21 MED ORDER — ACETAMINOPHEN 650 MG RE SUPP: 650.0000 mg | Freq: Four times a day (QID) | RECTAL | Status: DC | PRN

## 2015-12-21 MED ORDER — SODIUM CHLORIDE 0.9 % IJ SOLN: 3.0000 mL | INTRAMUSCULAR | Status: DC | PRN

## 2015-12-21 MED ORDER — ONDANSETRON HCL 4 MG PO TABS: 4.0000 mg | ORAL_TABLET | Freq: Four times a day (QID) | ORAL | Status: DC | PRN

## 2015-12-21 MED ORDER — ACETAMINOPHEN 500 MG PO TABS
1000.0000 mg | ORAL_TABLET | ORAL | Status: AC
  Administered 2015-12-21: 1000 mg via ORAL
  Filled 2015-12-21: qty 2

## 2015-12-21 MED ORDER — SODIUM CHLORIDE 0.9 % IV BOLUS (SEPSIS)
1000.0000 mL | Freq: Once | INTRAVENOUS | Status: AC
  Administered 2015-12-21: 1000 mL via INTRAVENOUS

## 2015-12-21 MED ORDER — POTASSIUM CHLORIDE CRYS ER 20 MEQ PO TBCR
40.0000 meq | EXTENDED_RELEASE_TABLET | Freq: Once | ORAL | Status: AC
  Administered 2015-12-21: 40 meq via ORAL
  Filled 2015-12-21: qty 2

## 2015-12-21 MED ORDER — INSULIN ASPART 100 UNIT/ML ~~LOC~~ SOLN
0.0000 [IU] | Freq: Three times a day (TID) | SUBCUTANEOUS | Status: DC
  Administered 2015-12-22 (×2): 20 [IU] via SUBCUTANEOUS
  Filled 2015-12-21 (×2): qty 20

## 2015-12-21 MED ORDER — ACETAMINOPHEN 325 MG PO TABS: 650.0000 mg | ORAL_TABLET | Freq: Four times a day (QID) | ORAL | Status: DC | PRN

## 2015-12-21 MED ORDER — INSULIN GLARGINE 100 UNIT/ML ~~LOC~~ SOLN
30.0000 [IU] | Freq: Every day | SUBCUTANEOUS | Status: DC
  Administered 2015-12-22: 30 [IU] via SUBCUTANEOUS
  Filled 2015-12-21: qty 0.3

## 2015-12-21 MED ORDER — DEXTROSE 5 % IV SOLN
500.0000 mg | INTRAVENOUS | Status: DC
  Filled 2015-12-21: qty 500

## 2015-12-21 NOTE — H&P (Signed)
[place] [facility] [place] NAME: Dylan Quinn    MR#:  [id number]  DATE OF BIRTH:  08-Mar-1936  DATE OF ADMISSION:  12/21/2015  PRIMARY CARE PHYSICIAN: Scarlette Calico, MD   REQUESTING/REFERRING PHYSICIAN: Delman Kitten, MD  CHIEF COMPLAINT:   Chief Complaint  Patient presents with  . Hyperglycemia  . Shortness of Breath  . Pneumonia   Productive cough, shortness of breath and hyperglycemia HISTORY OF PRESENT ILLNESS:  Dylan Quinn  is a 79 y.o. male with a known history of hypertension, diabetes and hyperlipidemia. The patient has had cough productive cough and shortness of breath for the past 2 weeks. His symptoms has been worsening for a few days. In addition he complains of fever generalized weakness and poor appetite. His family members got cold recently. Chest x-ray didn't show any infiltrate. He was suspected to acute bronchitis and treated with Levaquin in the ED.  PAST MEDICAL HISTORY:   Past Medical History  Diagnosis Date  . BENIGN PROSTATIC HYPERTROPHY 07/19/2009  . COLONIC POLYPS 02/14/2010  . DEPRESSION 03/16/2008  . DIABETES MELLITUS, TYPE I 06/30/2007  . FOLLICULITIS 0000000  . GLAUCOMA 07/19/2009  . HEARING LOSS 02/14/2010  . HYPERTENSION 05/14/2008  . HYPOGONADISM, MALE 12/16/2007  . LUMBAR RADICULOPATHY, LEFT 02/23/2010  . Other osteoporosis 12/16/2007  . PERIPHERAL NEUROPATHY 06/30/2007  . PROTEINURIA, MILD 02/14/2010  . URINARY CALCULUS 12/27/2010  . Dyslipidemia   . Leukopenia   . Psoriasis     PAST SURGICAL HISTORY:   Past Surgical History  Procedure Laterality Date  . Cataract extraction  2006  . Cataract extraction  1997  . Appendectomy  1986    SOCIAL HISTORY:   Social History  Substance Use Topics  . Smoking status: Never Smoker   . Smokeless tobacco: Never Used  . Alcohol Use: No     Comment: rare    FAMILY HISTORY:   Family History  Problem Relation Age of Onset  . Cancer Father     Colon  Cancer  . High blood pressure    . Heart disease Father     DRUG ALLERGIES:   Allergies  Allergen Reactions  . Lisinopril Hives    REVIEW OF SYSTEMS:  CONSTITUTIONAL: Has fever, poor appetite and generalized weakness.  EYES: No blurred or double vision.  EARS, NOSE, AND THROAT: No tinnitus or ear pain.  RESPIRATORY: Has productive cough, shortness of breath, wheezing but no hemoptysis.  CARDIOVASCULAR: No chest pain, orthopnea, edema.  GASTROINTESTINAL: No nausea, vomiting, diarrhea or abdominal pain.  GENITOURINARY: No dysuria, hematuria.  ENDOCRINE: No polyuria, nocturia,  HEMATOLOGY: No anemia, easy bruising or bleeding SKIN: No rash or lesion. MUSCULOSKELETAL: No joint pain or arthritis.   NEUROLOGIC: No tingling, numbness, weakness.  PSYCHIATRY: No anxiety or depression.   MEDICATIONS AT HOME:   Prior to Admission medications   Medication Sig Start Date End Date Taking? Authorizing Provider  brimonidine-timolol (COMBIGAN) 0.2-0.5 % ophthalmic solution Place 1 drop into both eyes 2 (two) times daily.    Yes Historical Provider, MD  chlorpheniramine-HYDROcodone (TUSSIONEX PENNKINETIC ER) 10-8 MG/5ML SUER Take 5 mLs by mouth every 12 (twelve) hours as needed for cough. 12/12/15  Yes Janith Lima, MD  clobetasol cream (TEMOVATE) [id number] % Apply 1 application topically 2 (two) times daily as needed (for rash).    Yes Historical Provider, MD  dorzolamide (TRUSOPT) 2 % ophthalmic solution Place 1 drop into both eyes 2 (two) times daily.   Yes Historical  Provider, MD  glucagon (GLUCAGON EMERGENCY) 1 MG injection Inject 1 mg into the muscle once as needed. Patient taking differently: Inject 1 mg into the muscle once as needed (severe hypoglycemia).  09/28/14  Yes Philemon Kingdom, MD  hydrochlorothiazide (HYDRODIURIL) 25 MG tablet Take 1 tablet (25 mg total) by mouth daily. 12/12/15  Yes Janith Lima, MD  insulin glargine (LANTUS) 100 UNIT/ML injection Inject 27 Units into the skin  daily.   Yes Historical Provider, MD  insulin lispro (HUMALOG) 100 UNIT/ML injection Inject 7-15 Units into the skin 3 (three) times daily with meals.   Yes Historical Provider, MD  latanoprost (XALATAN) 0.005 % ophthalmic solution Place 1 drop into both eyes at bedtime.   Yes Historical Provider, MD  olmesartan (BENICAR) 40 MG tablet Take 1 tablet (40 mg total) by mouth daily. 12/12/15  Yes Janith Lima, MD  pregabalin (LYRICA) 100 MG capsule Take 1 capsule (100 mg total) by mouth 2 (two) times daily. 12/12/15  Yes Janith Lima, MD      VITAL SIGNS:  Blood pressure 159/73, pulse 90, temperature 99.4 F (37.4 C), temperature source Oral, resp. rate 24, height 6' (1.829 m), weight 97.523 kg (215 lb), SpO2 92 %.  PHYSICAL EXAMINATION:  GENERAL:  78 y.o.-year-old patient lying in the bed with lethargy. EYES: Pupils equal, round, reactive to light and accommodation. No scleral icterus. Extraocular muscles intact.  HEENT: Head atraumatic, normocephalic. Oropharynx and nasopharynx clear.  NECK:  Supple, no jugular venous distention. No thyroid enlargement, no tenderness.  LUNGS: Normal breath sounds bilaterally, expiratory wheezing, no rales,rhonchi or crepitation. No use of accessory muscles of respiration.  CARDIOVASCULAR: S1, S2 normal. No murmurs, rubs, or gallops.  ABDOMEN: Soft, nontender, nondistended. Bowel sounds present. No organomegaly or mass.  EXTREMITIES: No pedal edema, cyanosis, or clubbing.  NEUROLOGIC: Cranial nerves II through XII are intact. Muscle strength 3-4/5 in all extremities. Sensation intact. Gait not checked.  PSYCHIATRIC: The patient is alert and oriented x 3.  SKIN: No obvious rash, lesion, or ulcer.   LABORATORY PANEL:   CBC  Recent Labs Lab 12/21/15 1458  WBC 9.0  HGB 12.8*  HCT 39.0*  PLT 260   ------------------------------------------------------------------------------------------------------------------  Chemistries   Recent Labs Lab  12/21/15 1458  NA 136  K 3.2*  CL 101  CO2 25  GLUCOSE 281*  BUN 13  CREATININE 0.96  CALCIUM 8.7*   ------------------------------------------------------------------------------------------------------------------  Cardiac Enzymes No results for input(s): TROPONINI in the last 168 hours. ------------------------------------------------------------------------------------------------------------------  RADIOLOGY:  Dg Chest 2 View  12/21/2015  CLINICAL DATA:  Increasing shortness of breath, cough EXAM: CHEST - 2 VIEW COMPARISON:  12/12/15 FINDINGS: Cardiac shadow is within normal limits. Mild interstitial changes are noted stable from the previous exam. Minimal scarring is again noted in the left mid lung. No acute abnormality is noted. IMPRESSION: No acute abnormality noted. Electronically Signed   By: Inez Catalina M.D.   On: 12/21/2015 15:21    EKG:   Orders placed or performed during the hospital encounter of 12/21/15  . EKG 12-Lead  . EKG 12-Lead    IMPRESSION AND PLAN:   Acute bronchitis with possible COPD exacerbation or pneumonia. The patient was treated with Levaquin in the ED. I will start Zithromax and Rocephin, IV Solu Medrol and DuoNeb when necessary. Follow-up flu test.  Uncontrolled diabetes. I will start resistant sliding scale and increase Lantus to 30 units at bedtime.  Hypokalemia. Potassium supplement and follow-up BMP and magnesium level. Hypertension. Continue  hypertension medication.   All the records are reviewed and case discussed with ED provider. Management plans discussed with the patient, family and they are in agreement.  CODE STATUS: Full code  TOTAL TIME TAKING CARE OF THIS PATIENT: 55 minutes.    Demetrios Loll M.D on 12/21/2015 at 9:41 PM  Between 7am to 6pm - Pager - [phone]  After 6pm go to [email or web address] - password [email or web address] [facility]  [facility]  Office  [phone]  CC: Primary care physician; Scarlette Calico,  MD

## 2015-12-21 NOTE — ED Notes (Signed)
Pt stated, "I feel to dizzy to walk". Orthostatic vitals done and Dr.Quale informed about not being able to do the pulse ox check while ambulating.

## 2015-12-21 NOTE — Plan of Care (Signed)
Problem: Education: Goal: Knowledge of [facility] Education information/materials will improve Outcome: Progressing Explained to the patient the importance of staying up to date with his vaccines. Also explained the safety precautions we have in place to protect him from injury.

## 2015-12-21 NOTE — ED Notes (Signed)
Attempted to call report. RN not ready.

## 2015-12-21 NOTE — ED Provider Notes (Addendum)
[facility] Emergency Department Provider Note  REMINDER - THIS NOTE IS NOT A FINAL MEDICAL RECORD UNTIL IT IS SIGNED. UNTIL THEN, THE CONTENT BELOW MAY REFLECT INFORMATION FROM A DOCUMENTATION TEMPLATE, NOT THE ACTUAL PATIENT VISIT. ____________________________________________  Time seen: Approximately 9:02 PM  I have reviewed the triage vital signs and the nursing notes.   HISTORY  Chief Complaint Hyperglycemia; Shortness of Breath; and Pneumonia    HPI Dylan Quinn is a 79 y.o. male history of prostate trouble, type 1 diabetes, presents today for a cough and fever which she has had intermittently for the last 2 weeks. Progressively worsening, feels mildly short of breath, shaking chills at times, see his primary care doctor and was diagnosed with viral illness given prescription cough medicine. He's been seen twice by his doctor and today reports he feels so weak and tired whenever he walks he barely get out of bed or eat. He continues to have been endorsed fevers, chills, productive cough. Denies any abdominal pain. No chest pain. Mild shortness of breath. No swelling.   Past Medical History  Diagnosis Date  . BENIGN PROSTATIC HYPERTROPHY 07/19/2009  . COLONIC POLYPS 02/14/2010  . DEPRESSION 03/16/2008  . DIABETES MELLITUS, TYPE I 06/30/2007  . FOLLICULITIS 0000000  . GLAUCOMA 07/19/2009  . HEARING LOSS 02/14/2010  . HYPERTENSION 05/14/2008  . HYPOGONADISM, MALE 12/16/2007  . LUMBAR RADICULOPATHY, LEFT 02/23/2010  . Other osteoporosis 12/16/2007  . PERIPHERAL NEUROPATHY 06/30/2007  . PROTEINURIA, MILD 02/14/2010  . URINARY CALCULUS 12/27/2010  . Dyslipidemia   . Leukopenia   . Psoriasis     Patient Active Problem List   Diagnosis Date Noted  . COPD exacerbation ([place]) 12/21/2015  . Cough 12/12/2015  . Type 1 diabetes mellitus with nephropathy ([place]) 12/12/2015  . URI (upper respiratory infection) 12/09/2015  . Normocytic anemia 05/04/2013  . Insomnia  07/28/2012  . HEARING LOSS 02/14/2010  . PROTEINURIA, MILD 02/14/2010  . GLAUCOMA 07/19/2009  . BENIGN PROSTATIC HYPERTROPHY 07/19/2009  . Essential hypertension 05/14/2008  . Other osteoporosis 12/16/2007  . Diabetes mellitus type 1 with neurological manifestations ([place]) 06/30/2007  . PERIPHERAL NEUROPATHY 06/30/2007    Past Surgical History  Procedure Laterality Date  . Cataract extraction  2006  . Cataract extraction  1997  . Appendectomy  1986    Current Outpatient Rx  Name  Route  Sig  Dispense  Refill  . brimonidine-timolol (COMBIGAN) 0.2-0.5 % ophthalmic solution   Both Eyes   Place 1 drop into both eyes 2 (two) times daily.          . chlorpheniramine-HYDROcodone (TUSSIONEX PENNKINETIC ER) 10-8 MG/5ML SUER   Oral   Take 5 mLs by mouth every 12 (twelve) hours as needed for cough.   140 mL   0   . clobetasol cream (TEMOVATE) 0.05 %   Topical   Apply 1 application topically 2 (two) times daily as needed (for rash).          . dorzolamide (TRUSOPT) 2 % ophthalmic solution   Both Eyes   Place 1 drop into both eyes 2 (two) times daily.         Marland Kitchen glucagon (GLUCAGON EMERGENCY) 1 MG injection   Intramuscular   Inject 1 mg into the muscle once as needed. Patient taking differently: Inject 1 mg into the muscle once as needed (severe hypoglycemia).    2 each   1   . hydrochlorothiazide (HYDRODIURIL) 25 MG tablet   Oral   Take 1  tablet (25 mg total) by mouth daily.   90 tablet   1   . insulin glargine (LANTUS) 100 UNIT/ML injection   Subcutaneous   Inject 27 Units into the skin daily.         . insulin lispro (HUMALOG) 100 UNIT/ML injection   Subcutaneous   Inject 7-15 Units into the skin 3 (three) times daily with meals.         . latanoprost (XALATAN) 0.005 % ophthalmic solution   Both Eyes   Place 1 drop into both eyes at bedtime.         Marland Kitchen olmesartan (BENICAR) 40 MG tablet   Oral   Take 1 tablet (40 mg total) by mouth daily.   90 tablet    3   . pregabalin (LYRICA) 100 MG capsule   Oral   Take 1 capsule (100 mg total) by mouth 2 (two) times daily.   180 capsule   1     Allergies Lisinopril  Family History  Problem Relation Age of Onset  . Cancer Father     Colon Cancer  . High blood pressure    . Heart disease Father     Social History Social History  Substance Use Topics  . Smoking status: Never Smoker   . Smokeless tobacco: Never Used  . Alcohol Use: No     Comment: rare    Review of Systems Constitutional: Fever and chills Eyes: No visual changes. ENT: No sore throat. Cardiovascular: Denies chest pain. Respiratory: Negative cough, no wheezing  Gastrointestinal: No abdominal pain.  No nausea, no vomiting.  No diarrhea.  No constipation. Genitourinary: Negative for dysuria. Musculoskeletal: Negative for back pain. Skin: Negative for rash. Neurological: Negative for headaches, focal weakness or numbness.  10-point ROS otherwise negative.  ____________________________________________   PHYSICAL EXAM:  VITAL SIGNS: ED Triage Vitals  Enc Vitals Group     BP 12/21/15 1448 161/62 mmHg     Pulse Rate 12/21/15 1448 103     Resp 12/21/15 1448 20     Temp 12/21/15 1448 98.1 F (36.7 C)     Temp Source 12/21/15 1448 Oral     SpO2 12/21/15 1448 96 %     Weight 12/21/15 1448 215 lb (97.523 kg)     Height 12/21/15 1448 6' (1.829 m)     Head Cir --      Peak Flow --      Pain Score 12/21/15 1811 0     Pain Loc --      Pain Edu? --      Excl. in [place]? --    Constitutional: Alert and oriented. Fatigued and mildly ill appearing. Eyes: Conjunctivae are normal. PERRL. EOMI. Head: Atraumatic. Nose: No congestion/rhinnorhea. Mouth/Throat: Mucous membranes are quite dry.  Oropharynx non-erythematous. Neck: No stridor.   Cardiovascular: Normal rate, regular rhythm. Grossly normal heart sounds.  Good peripheral circulation. Respiratory: Very mild Tachypnea.  No retractions. Lungs CTAB except for focal  rales in the left lower lobe and lung base. Gastrointestinal: Soft and nontender. No distention. No abdominal bruits. No CVA tenderness. Musculoskeletal: No lower extremity tenderness nor edema.  No joint effusions. Neurologic:  Normal speech and language. No gross focal neurologic deficits are appreciated.   When asked to stand, the patient reports that he feels very lightheaded and has to sit down.  Skin:  Skin is warm, dry and intact. No rash noted. Psychiatric: Mood and affect are normal. Speech and behavior are normal.  ____________________________________________  LABS (all labs ordered are listed, but only abnormal results are displayed)  Labs Reviewed  BASIC METABOLIC PANEL - Abnormal; Notable for the following:    Potassium 3.2 (*)    Glucose, Bld 281 (*)    Calcium 8.7 (*)    All other components within normal limits  CBC - Abnormal; Notable for the following:    RBC 4.37 (*)    Hemoglobin 12.8 (*)    HCT 39.0 (*)    All other components within normal limits  URINALYSIS COMPLETEWITH MICROSCOPIC ([facility] ONLY) - Abnormal; Notable for the following:    Color, Urine YELLOW (*)    APPearance CLEAR (*)    Glucose, UA 50 (*)    Ketones, ur TRACE (*)    Hgb urine dipstick 1+ (*)    Protein, ur 100 (*)    Squamous Epithelial / LPF 0-5 (*)    All other components within normal limits  GLUCOSE, CAPILLARY - Abnormal; Notable for the following:    Glucose-Capillary 261 (*)    All other components within normal limits  GLUCOSE, CAPILLARY - Abnormal; Notable for the following:    Glucose-Capillary 226 (*)    All other components within normal limits  CULTURE, BLOOD (ROUTINE X 2)  CULTURE, BLOOD (ROUTINE X 2)  RAPID INFLUENZA A&B ANTIGENS ([facility] ONLY)  CBG MONITORING, ED   ____________________________________________  EKG  Reviewed and interpreted by me  EKG time 1500 Sinus tachycardia, no acute ischemic abnormality PR 142 QRS 96 QTc 420 Ventricular rate  100 ____________________________________________  RADIOLOGY  DG Chest 2 View (Final result) Result time: 12/21/15 15:21:34   Final result by Rad Results In Interface (12/21/15 15:21:34)   Narrative:   CLINICAL DATA: Increasing shortness of breath, cough  EXAM: CHEST - 2 VIEW  COMPARISON: 12/12/15  FINDINGS: Cardiac shadow is within normal limits. Mild interstitial changes are noted stable from the previous exam. Minimal scarring is again noted in the left mid lung. No acute abnormality is noted.  IMPRESSION: No acute abnormality noted.   Electronically Signed By: Inez Catalina M.D. On: 12/21/2015 15:21       ____________________________________________   PROCEDURES  Procedure(s) performed: None  Critical Care performed: No  Personally checked the patient's temperature during the time of this physical exam it was 100.4 orally. ____________________________________________   INITIAL IMPRESSION / ASSESSMENT AND PLAN / ED COURSE  Pertinent labs & imaging results that were available during my care of the patient were reviewed by me and considered in my medical decision making (see chart for details).  Patient presents with fever, chills, weakness, productive cough for about 2 weeks. His chest x-ray is clear, but he does demonstrate focal rales in the left lower lobe and given his fever and symptomatology I suspect he may have pneumonia versus viral illness. Given his ongoing symptoms will initiate antibiotics. He has severe orthostasis and feels that is going to pass out with standing. It is notable tachycardia and after a liter of fluid on orthostatics, we will admit him to the hospital for ongoing care and workup. He does report some improvement with fluids, however continues to feel weak. My clinical diagnosis at this time is likely pneumonia versus severe persistent viral illness. Certainly no significant risk factor for pulmonary embolism, EKG reassuring  without obvious ischemia. Ongoing care and disposition per Dr. Lavetta Nielsen of the hospitalist service. ____________________________________________   FINAL CLINICAL IMPRESSION(S) / ED DIAGNOSES  Final diagnoses:  Pneumonia of left lower lobe due to infectious organism  Orthostatic  dizziness      Delman Kitten, MD 12/21/15 [id number]  Delman Kitten, MD 01/06/16 [phone]

## 2015-12-21 NOTE — ED Notes (Signed)
Pt sent to ER from [facility] for evaluation of possible pneumonia and hyperglycemia. Blood sugar in 300's X2 weeks, pt decreased appetite. Pt also c/o URI sx. Pt alert and oriented X4, active, cooperative, pt in NAD. RR even and unlabored, color WNL.

## 2015-12-22 DIAGNOSIS — J449 Chronic obstructive pulmonary disease, unspecified: Secondary | ICD-10-CM | POA: Diagnosis present

## 2015-12-22 DIAGNOSIS — E119 Type 2 diabetes mellitus without complications: Secondary | ICD-10-CM | POA: Diagnosis not present

## 2015-12-22 DIAGNOSIS — I10 Essential (primary) hypertension: Secondary | ICD-10-CM | POA: Diagnosis not present

## 2015-12-22 DIAGNOSIS — J441 Chronic obstructive pulmonary disease with (acute) exacerbation: Secondary | ICD-10-CM | POA: Diagnosis not present

## 2015-12-22 DIAGNOSIS — J209 Acute bronchitis, unspecified: Secondary | ICD-10-CM | POA: Diagnosis not present

## 2015-12-22 LAB — CBC
HCT: 37.7 % — ABNORMAL LOW (ref 40.0–52.0)
Hemoglobin: 12.3 g/dL — ABNORMAL LOW (ref 13.0–18.0)
MCH: 29 pg (ref 26.0–34.0)
MCHC: 32.6 g/dL (ref 32.0–36.0)
MCV: 89.2 fL (ref 80.0–100.0)
PLATELETS: 270 10*3/uL (ref 150–440)
RBC: 4.23 MIL/uL — AB (ref 4.40–5.90)
RDW: 14 % (ref 11.5–14.5)
WBC: 15.4 10*3/uL — ABNORMAL HIGH (ref 3.8–10.6)

## 2015-12-22 LAB — INFLUENZA PANEL BY PCR (TYPE A & B)
H1N1FLUPCR: NOT DETECTED
INFLBPCR: NEGATIVE
Influenza A By PCR: NEGATIVE

## 2015-12-22 LAB — BASIC METABOLIC PANEL
Anion gap: 9 (ref 5–15)
BUN: 12 mg/dL (ref 6–20)
CHLORIDE: 104 mmol/L (ref 101–111)
CO2: 24 mmol/L (ref 22–32)
CREATININE: 0.87 mg/dL (ref 0.61–1.24)
Calcium: 8.3 mg/dL — ABNORMAL LOW (ref 8.9–10.3)
GFR calc non Af Amer: >60 mL/min (ref >60)
GLUCOSE: 323 mg/dL — AB (ref 65–99)
Potassium: 3.9 mmol/L (ref 3.5–5.1)
Sodium: 137 mmol/L (ref 135–145)

## 2015-12-22 LAB — GLUCOSE, CAPILLARY
GLUCOSE-CAPILLARY: 340 mg/dL — AB (ref 65–99)
Glucose-Capillary: 368 mg/dL — ABNORMAL HIGH (ref 65–99)
Glucose-Capillary: 367 mg/dL — ABNORMAL HIGH (ref 65–99)

## 2015-12-22 MED ORDER — INSULIN ASPART 100 UNIT/ML ~~LOC~~ SOLN: 0.0000 [IU] | Freq: Three times a day (TID) | SUBCUTANEOUS | Status: DC

## 2015-12-22 MED ORDER — INSULIN ASPART 100 UNIT/ML ~~LOC~~ SOLN
10.0000 [IU] | Freq: Once | SUBCUTANEOUS | Status: AC
  Administered 2015-12-22: 10 [IU] via SUBCUTANEOUS
  Filled 2015-12-22: qty 10

## 2015-12-22 MED ORDER — AZITHROMYCIN 250 MG PO TABS: 250.0000 mg | ORAL_TABLET | Freq: Every day | ORAL | Status: DC

## 2015-12-22 MED ORDER — PREDNISONE 20 MG PO TABS
60.0000 mg | ORAL_TABLET | Freq: Every day | ORAL | Status: DC
  Administered 2015-12-22: 60 mg via ORAL
  Filled 2015-12-22: qty 3

## 2015-12-22 MED ORDER — INSULIN ASPART 100 UNIT/ML ~~LOC~~ SOLN: 0.0000 [IU] | Freq: Every day | SUBCUTANEOUS | Status: DC

## 2015-12-22 MED ORDER — PREDNISONE 10 MG PO TABS: 10.0000 mg | ORAL_TABLET | Freq: Every day | ORAL | Status: DC

## 2015-12-22 MED ORDER — SENNOSIDES-DOCUSATE SODIUM 8.6-50 MG PO TABS
2.0000 | ORAL_TABLET | Freq: Once | ORAL | Status: AC
  Administered 2015-12-22: 2 via ORAL
  Filled 2015-12-22: qty 2

## 2015-12-22 NOTE — Progress Notes (Signed)
Pt discharged home after supplemental insulin. dtr present . Pt assesses fsbs at home and uses ssi as needed tid. rn i/s on new meds . Understanding verbalized. Left via w/c. All scripts at [facility] in [place]

## 2015-12-22 NOTE — Progress Notes (Addendum)
Inpatient Diabetes Program Recommendations  AACE/ADA: New Consensus Statement on Inpatient Glycemic Control (2015)  Target Ranges:  Prepandial:   less than 140 mg/dL      Peak postprandial:   less than 180 mg/dL (1-2 hours)      Critically ill patients:  140 - 180 mg/dL   Review of Glycemic Control  Results for Dylan Quinn, Dylan Quinn (MRN [id number]) as of 12/22/2015 10:04  Ref. Range 12/21/2015 14:50 12/21/2015 18:00 12/21/2015 23:25 12/22/2015 08:19  Glucose-Capillary Latest Ref Range: 65-99 mg/dL 261 (H) 226 (H) 130 (H) 368 (H)     Diabetes history: Type 2, A1C 10.2% on 12/12/15 Outpatient Diabetes medications: Lantus 27 units qhs, Humalog 7-15 units tid with meals Current orders for Inpatient glycemic control: Lantus 30 units qhs, Novolog 0-15 units tid, Novolog 0-20 units tid  Inpatient Diabetes Program Recommendations: Based on notes written by MD, Dr. Bridgett Larsson last evening, MD requests Lantus 30 units qhs and resistant correction insulin tid. Noted that patient has been ordered both moderate and resistant correction insulin- Dava Isley notified.   MD- please d/c order for Novolog correction insulin 0-15 units tid (Novolog 0-20 units tid is also ordered) and consider adding Novolog 0-5 units qhs.  Based on current blood sugar numbers and steroid use patient will likely require mealtime insulin.  I met with the patient and have verified that he DOES take his insulin as ordered.  He is a patient of Dr. Cruzita Lederer, endocrinology in [place]- last seen in the fall. Patient informs me he will contact her immediately after discharge- he is very anxious because he has 6 weeks of radiation therapy scheduled for late January in [place] and wants better blood sugar control before he begins treatments.    Gentry Fitz, RN, BA, MHA, CDE Diabetes Coordinator Inpatient Diabetes Program  [phone] (Team Pager) [phone] ([place]) 12/22/2015 7:33 AM

## 2015-12-22 NOTE — Care Management Obs Status (Signed)
[facility] NOTIFICATION   Patient Details  Name: Dylan Quinn MRN: [id number] Date of Birth: 03-Aug-1936   Medicare Observation Status Notification Given:  Yes    Marshell Garfinkel, RN 12/22/2015, 11:19 AM

## 2015-12-22 NOTE — Progress Notes (Addendum)
Pt with continued elevated fsbs 340. Took lantus at 12. Dr mody notified . md reports adm novolog 10 units  Prior  to discharge

## 2015-12-22 NOTE — Care Management (Signed)
Met with patient who is being discharged home today. He denies need for home health. He states he plans to return to [place] soon. No further RNCM needs.  

## 2015-12-22 NOTE — Discharge Summary (Signed)
[facility] NAME: Dylan Quinn    MR#:  [id number]  DATE OF BIRTH:  09/23/1936  DATE OF ADMISSION:  12/21/2015 ADMITTING PHYSICIAN: Demetrios Loll, MD  DATE OF DISCHARGE: 12/22/2015  PRIMARY CARE PHYSICIAN: Scarlette Calico, MD    ADMISSION DIAGNOSIS:  Orthostatic dizziness [R42] Pneumonia of left lower lobe due to infectious organism [J18.9]  DISCHARGE DIAGNOSIS:  Principal Problem:   COPD exacerbation ([place]) Active Problems:   COPD (chronic obstructive pulmonary disease) ([place])   SECONDARY DIAGNOSIS:   Past Medical History  Diagnosis Date  . BENIGN PROSTATIC HYPERTROPHY 07/19/2009  . COLONIC POLYPS 02/14/2010  . DEPRESSION 03/16/2008  . DIABETES MELLITUS, TYPE I 06/30/2007  . FOLLICULITIS 0000000  . GLAUCOMA 07/19/2009  . HEARING LOSS 02/14/2010  . HYPERTENSION 05/14/2008  . HYPOGONADISM, MALE 12/16/2007  . LUMBAR RADICULOPATHY, LEFT 02/23/2010  . Other osteoporosis 12/16/2007  . PERIPHERAL NEUROPATHY 06/30/2007  . PROTEINURIA, MILD 02/14/2010  . URINARY CALCULUS 12/27/2010  . Dyslipidemia   . Leukopenia   . Psoriasis     HOSPITAL COURSE:    79 year old male with a history of BPH and diabetes who presents with cough and acute bronchitis. For further details please refer to H&P.  1. Acute bronchitis: Chest x-ray did not show evidence of pneumonia. Patient has a persistent cough which brought into the hospital. I suspect his bronchitis is likely viral in etiology and discussed with the patient he may have a lingering cough for a few weeks. He denied postnasal drip or wheezing. He denied heartburn. If his cough persists he may be evaluated for GERD, postnasal drip and/or adult onset asthma.  2. Diabetes: Patient will continue with home regimen. His blood sugars were elevated due to the fact the patient was on IV steroids while in the hospital for acute proctitis.  3. Essential hypertension: Continue Benicar 4. History of  prostate cancer: Patient will start radiation at the end of January.  DISCHARGE CONDITIONS AND DIET:  Patient is stable for discharge home on a diabetic diet  CONSULTS OBTAINED:     DRUG ALLERGIES:   Allergies  Allergen Reactions  . Lisinopril Hives    DISCHARGE MEDICATIONS:   Current Discharge Medication List    START taking these medications   Details  azithromycin (ZITHROMAX) 250 MG tablet Take 1 tablet (250 mg total) by mouth daily. Qty: 6 each, Refills: 0    predniSONE (DELTASONE) 10 MG tablet Take 1 tablet (10 mg total) by mouth daily with breakfast. Qty: 20 tablet, Refills: 0      CONTINUE these medications which have NOT CHANGED   Details  brimonidine-timolol (COMBIGAN) 0.2-0.5 % ophthalmic solution Place 1 drop into both eyes 2 (two) times daily.     chlorpheniramine-HYDROcodone (TUSSIONEX PENNKINETIC ER) 10-8 MG/5ML SUER Take 5 mLs by mouth every 12 (twelve) hours as needed for cough. Qty: 140 mL, Refills: 0   Associated Diagnoses: Cough    clobetasol cream (TEMOVATE) [id number] % Apply 1 application topically 2 (two) times daily as needed (for rash).     dorzolamide (TRUSOPT) 2 % ophthalmic solution Place 1 drop into both eyes 2 (two) times daily.    glucagon (GLUCAGON EMERGENCY) 1 MG injection Inject 1 mg into the muscle once as needed. Qty: 2 each, Refills: 1    hydrochlorothiazide (HYDRODIURIL) 25 MG tablet Take 1 tablet (25 mg total) by mouth daily. Qty: 90 tablet, Refills: 1   Associated Diagnoses: Essential hypertension    insulin glargine (  LANTUS) 100 UNIT/ML injection Inject 27 Units into the skin daily.    insulin lispro (HUMALOG) 100 UNIT/ML injection Inject 7-15 Units into the skin 3 (three) times daily with meals.    latanoprost (XALATAN) 0.005 % ophthalmic solution Place 1 drop into both eyes at bedtime.    olmesartan (BENICAR) 40 MG tablet Take 1 tablet (40 mg total) by mouth daily. Qty: 90 tablet, Refills: 3   Associated Diagnoses: Type 1  diabetes mellitus with nephropathy ([place]); Proteinuria; Essential hypertension    pregabalin (LYRICA) 100 MG capsule Take 1 capsule (100 mg total) by mouth 2 (two) times daily. Qty: 180 capsule, Refills: 1   Associated Diagnoses: Type 1 diabetes mellitus with diabetic neuropathy ([place])              Today   CHIEF COMPLAINT:  Patient is complaining of cough. Patient has no fevers or chills. Patient denies chest pain or shortness of breath. Patient has no wheezing. He does state that at night she was having sweats.   VITAL SIGNS:  Blood pressure 158/69, pulse 74, temperature 98 F (36.7 C), temperature source Oral, resp. rate 20, height 6' (1.829 m), weight 97.523 kg (215 lb), SpO2 97 %.   REVIEW OF SYSTEMS:  Review of Systems  Constitutional: Negative for fever, chills and malaise/fatigue.  HENT: Negative for sore throat.   Eyes: Negative for blurred vision.  Respiratory: Positive for cough. Negative for hemoptysis, shortness of breath and wheezing.   Cardiovascular: Negative for chest pain, palpitations and leg swelling.  Gastrointestinal: Negative for nausea, vomiting, abdominal pain, diarrhea and blood in stool.  Genitourinary: Negative for dysuria.  Musculoskeletal: Negative for back pain.  Neurological: Negative for dizziness, tremors and headaches.  Endo/Heme/Allergies: Does not bruise/bleed easily.     PHYSICAL EXAMINATION:  GENERAL:  79 y.o.-year-old patient lying in the bed with no acute distress.  NECK:  Supple, no jugular venous distention. No thyroid enlargement, no tenderness.  LUNGS: Normal breath sounds bilaterally, no wheezing, rales,rhonchi  No use of accessory muscles of respiration.  CARDIOVASCULAR: S1, S2 normal. No murmurs, rubs, or gallops.  ABDOMEN: Soft, non-tender, non-distended. Bowel sounds present. No organomegaly or mass.  EXTREMITIES: No pedal edema, cyanosis, or clubbing.  PSYCHIATRIC: The patient is alert and oriented x 3.  SKIN: No  obvious rash, lesion, or ulcer.   DATA REVIEW:   CBC  Recent Labs Lab 12/22/15 0429  WBC 15.4*  HGB 12.3*  HCT 37.7*  PLT 270    Chemistries   Recent Labs Lab 12/21/15 2327 12/22/15 0429  NA  --  137  K  --  3.9  CL  --  104  CO2  --  24  GLUCOSE  --  323*  BUN  --  12  CREATININE  --  0.87  CALCIUM  --  8.3*  MG 1.7  --     Cardiac Enzymes No results for input(s): TROPONINI in the last 168 hours.  Microbiology Results  @[email or web address] @  RADIOLOGY:  Dg Chest 2 View  12/21/2015  CLINICAL DATA:  Increasing shortness of breath, cough EXAM: CHEST - 2 VIEW COMPARISON:  12/12/15 FINDINGS: Cardiac shadow is within normal limits. Mild interstitial changes are noted stable from the previous exam. Minimal scarring is again noted in the left mid lung. No acute abnormality is noted. IMPRESSION: No acute abnormality noted. Electronically Signed   By: Inez Catalina M.D.   On: 12/21/2015 15:21      Management plans discussed with the patient and  he is in agreement. Stable for discharge home  Patient should follow up with PCP 1 week  CODE STATUS:     Code Status Orders        Start     Ordered   12/21/15 2300  Full code   Continuous     12/21/15 2259      TOTAL TIME TAKING CARE OF THIS PATIENT: 35 minutes.    Note: This dictation was prepared with Dragon dictation along with smaller phrase technology. Any transcriptional errors that result from this process are unintentional.  Apphia Cropley M.D on 12/22/2015 at 10:09 AM  Between 7am to 6pm - Pager - [phone] After 6pm go to [email or web address] - password [email or web address] [facility]  [place] [facility]  Office  [phone]  CC: Primary care physician; Scarlette Calico, MD

## 2015-12-22 NOTE — Progress Notes (Signed)
rn spoke with dr mody re: fsbs >300. md reports to assess fsbs later and pt can still discharge today

## 2015-12-27 LAB — CULTURE, BLOOD (ROUTINE X 2)
CULTURE: NO GROWTH
CULTURE: NO GROWTH

## 2015-12-29 ENCOUNTER — Ambulatory Visit (INDEPENDENT_AMBULATORY_CARE_PROVIDER_SITE_OTHER): Payer: Medicare Other | Admitting: Internal Medicine

## 2015-12-29 ENCOUNTER — Encounter: Payer: Self-pay | Admitting: Internal Medicine

## 2015-12-29 ENCOUNTER — Other Ambulatory Visit (INDEPENDENT_AMBULATORY_CARE_PROVIDER_SITE_OTHER): Payer: Medicare Other

## 2015-12-29 VITALS — BP 154/86 | HR 74 | Temp 98.1°F | Resp 16 | Ht 72.0 in | Wt 221.0 lb

## 2015-12-29 DIAGNOSIS — C61 Malignant neoplasm of prostate: Secondary | ICD-10-CM

## 2015-12-29 DIAGNOSIS — J069 Acute upper respiratory infection, unspecified: Secondary | ICD-10-CM

## 2015-12-29 DIAGNOSIS — D649 Anemia, unspecified: Secondary | ICD-10-CM | POA: Diagnosis not present

## 2015-12-29 DIAGNOSIS — R05 Cough: Secondary | ICD-10-CM | POA: Diagnosis not present

## 2015-12-29 DIAGNOSIS — R059 Cough, unspecified: Secondary | ICD-10-CM

## 2015-12-29 LAB — CBC WITH DIFFERENTIAL/PLATELET
BASOS PCT: 0.4 % (ref 0.0–3.0)
Basophils Absolute: 0 10*3/uL (ref 0.0–0.1)
EOS PCT: 2.2 % (ref 0.0–5.0)
Eosinophils Absolute: 0.2 10*3/uL (ref 0.0–0.7)
HEMATOCRIT: 36.9 % — AB (ref 39.0–52.0)
HEMOGLOBIN: 12.2 g/dL — AB (ref 13.0–17.0)
LYMPHS PCT: 22.2 % (ref 12.0–46.0)
Lymphs Abs: 1.6 10*3/uL (ref 0.7–4.0)
MCHC: 33.1 g/dL (ref 30.0–36.0)
MCV: 90.1 fl (ref 78.0–100.0)
Monocytes Absolute: 0.6 10*3/uL (ref 0.1–1.0)
Monocytes Relative: 7.5 % (ref 3.0–12.0)
NEUTROS ABS: 5 10*3/uL (ref 1.4–7.7)
Neutrophils Relative %: 67.7 % (ref 43.0–77.0)
Platelets: 345 10*3/uL (ref 150.0–400.0)
RBC: 4.1 Mil/uL — ABNORMAL LOW (ref 4.22–5.81)
RDW: 14.8 % (ref 11.5–15.5)
WBC: 7.3 10*3/uL (ref 4.0–10.5)

## 2015-12-29 LAB — IBC PANEL
Iron: 74 ug/dL (ref 42–165)
Saturation Ratios: 28.1 % (ref 20.0–50.0)
Transferrin: 188 mg/dL — ABNORMAL LOW (ref 212.0–360.0)

## 2015-12-29 LAB — FOLATE: FOLATE: 8.2 ng/mL (ref >5.9)

## 2015-12-29 LAB — RETICULOCYTES
ABS RETIC: 52.8 10*3/uL (ref 19.0–186.0)
RBC.: 4.06 MIL/uL — AB (ref 4.22–5.81)
RETIC CT PCT: 1.3 % (ref 0.4–2.3)

## 2015-12-29 LAB — FERRITIN: Ferritin: 172 ng/mL (ref 22.0–322.0)

## 2015-12-29 LAB — VITAMIN B12: VITAMIN B 12: 551 pg/mL (ref 211–911)

## 2015-12-29 MED ORDER — HYDROCOD POLST-CPM POLST ER 10-8 MG/5ML PO SUER: 5.0000 mL | Freq: Two times a day (BID) | ORAL | Status: DC | PRN

## 2015-12-29 NOTE — Progress Notes (Signed)
Subjective:  Patient ID: Dylan Quinn, male    DOB: 1936/07/23  Age: 80 y.o. MRN: [id number]  CC: Anemia; COPD; and Cough   HPI [name] presents for f/up after a recent admission for URI/COPD, he feels better, the cough is resolving and it is NP.  Outpatient Prescriptions Prior to Visit  Medication Sig Dispense Refill  . brimonidine-timolol (COMBIGAN) 0.2-0.5 % ophthalmic solution Place 1 drop into both eyes 2 (two) times daily.     . clobetasol cream (TEMOVATE) [id number] % Apply 1 application topically 2 (two) times daily as needed (for rash).     . dorzolamide (TRUSOPT) 2 % ophthalmic solution Place 1 drop into both eyes 2 (two) times daily.    Marland Kitchen glucagon (GLUCAGON EMERGENCY) 1 MG injection Inject 1 mg into the muscle once as needed. (Patient taking differently: Inject 1 mg into the muscle once as needed (severe hypoglycemia). ) 2 each 1  . hydrochlorothiazide (HYDRODIURIL) 25 MG tablet Take 1 tablet (25 mg total) by mouth daily. 90 tablet 1  . insulin glargine (LANTUS) 100 UNIT/ML injection Inject 27 Units into the skin daily.    . insulin lispro (HUMALOG) 100 UNIT/ML injection Inject 7-15 Units into the skin 3 (three) times daily with meals.    . latanoprost (XALATAN) 0.005 % ophthalmic solution Place 1 drop into both eyes at bedtime.    Marland Kitchen olmesartan (BENICAR) 40 MG tablet Take 1 tablet (40 mg total) by mouth daily. 90 tablet 3  . pregabalin (LYRICA) 100 MG capsule Take 1 capsule (100 mg total) by mouth 2 (two) times daily. 180 capsule 1  . azithromycin (ZITHROMAX) 250 MG tablet Take 1 tablet (250 mg total) by mouth daily. 6 each 0  . chlorpheniramine-HYDROcodone (TUSSIONEX PENNKINETIC ER) 10-8 MG/5ML SUER Take 5 mLs by mouth every 12 (twelve) hours as needed for cough. 140 mL 0  . predniSONE (DELTASONE) 10 MG tablet Take 1 tablet (10 mg total) by mouth daily with breakfast. 20 tablet 0   No facility-administered medications prior to visit.    ROS Review of Systems    Constitutional: Positive for fatigue. Negative for fever, chills, diaphoresis, activity change, appetite change and unexpected weight change.  HENT: Negative.  Negative for sore throat and trouble swallowing.   Eyes: Negative.   Respiratory: Positive for cough. Negative for apnea, choking, chest tightness, shortness of breath, wheezing and stridor.   Cardiovascular: Negative.  Negative for chest pain, palpitations and leg swelling.  Gastrointestinal: Negative.  Negative for nausea, vomiting, abdominal pain, diarrhea and constipation.  Endocrine: Negative.   Genitourinary: Negative.   Musculoskeletal: Negative.  Negative for myalgias, back pain and joint swelling.  Skin: Negative.  Negative for color change, pallor and rash.  Allergic/Immunologic: Negative.   Neurological: Negative.  Negative for dizziness, tremors, weakness, light-headedness, numbness and headaches.  Hematological: Negative.  Negative for adenopathy. Does not bruise/bleed easily.  Psychiatric/Behavioral: Negative.     Objective:  BP 154/86 mmHg  Pulse 74  Temp(Src) 98.1 F (36.7 C) (Oral)  Resp 16  Ht 6' (1.829 m)  Wt 221 lb (100.245 kg)  BMI 29.97 kg/m2  SpO2 94%  BP Readings from Last 3 Encounters:  12/29/15 154/86  12/22/15 142/68  12/12/15 138/88    Wt Readings from Last 3 Encounters:  12/29/15 221 lb (100.245 kg)  12/21/15 215 lb (97.523 kg)  12/12/15 223 lb 8 oz (101.379 kg)    Physical Exam  Constitutional: He is oriented to person, place, and time.  Non-toxic appearance. He does not have a sickly appearance. He does not appear ill. No distress.  HENT:  Mouth/Throat: Oropharynx is clear and moist. No oropharyngeal exudate.  Eyes: Conjunctivae are normal. Right eye exhibits no discharge. Left eye exhibits no discharge. No scleral icterus.  Neck: Normal range of motion. Neck supple. No JVD present. No tracheal deviation present. No thyromegaly present.  Cardiovascular: Normal rate, regular rhythm,  normal heart sounds and intact distal pulses.  Exam reveals no gallop and no friction rub.   No murmur heard. Pulmonary/Chest: Effort normal and breath sounds normal. No stridor. No respiratory distress. He has no wheezes. He has no rales. He exhibits no tenderness.  Abdominal: Soft. Bowel sounds are normal. He exhibits no distension and no mass. There is no tenderness. There is no rebound and no guarding.  Musculoskeletal: Normal range of motion. He exhibits no edema or tenderness.  Lymphadenopathy:    He has no cervical adenopathy.  Neurological: He is oriented to person, place, and time.  Skin: Skin is warm and dry. No rash noted. He is not diaphoretic. No erythema. No pallor.  Vitals reviewed.   Lab Results  Component Value Date   WBC 7.3 12/29/2015   HGB 12.2* 12/29/2015   HCT 36.9* 12/29/2015   PLT 345.0 12/29/2015   GLUCOSE 323* 12/22/2015   CHOL 122 12/12/2015   TRIG 91.0 12/12/2015   HDL 44.50 12/12/2015   LDLCALC 59 12/12/2015   ALT 7 05/04/2013   AST 8 05/04/2013   NA 137 12/22/2015   K 3.9 12/22/2015   CL 104 12/22/2015   CREATININE 0.87 12/22/2015   BUN 12 12/22/2015   CO2 24 12/22/2015   TSH 0.53 12/12/2015   PSA 3.80 01/13/2013   HGBA1C 10.2* 12/12/2015   MICROALBUR 57.5* 12/12/2015    Dg Chest 2 View  12/21/2015  CLINICAL DATA:  Increasing shortness of breath, cough EXAM: CHEST - 2 VIEW COMPARISON:  12/12/15 FINDINGS: Cardiac shadow is within normal limits. Mild interstitial changes are noted stable from the previous exam. Minimal scarring is again noted in the left mid lung. No acute abnormality is noted. IMPRESSION: No acute abnormality noted. Electronically Signed   By: Inez Catalina M.D.   On: 12/21/2015 15:21    Assessment & Plan:   Ashay was seen today for anemia, copd and cough.  Diagnoses and all orders for this visit:  Normocytic anemia- improvement noted -     CBC with Differential/Platelet; Future -     IBC panel; Future -     Ferritin;  Future -     Folate; Future -     Vitamin B12; Future -     Reticulocytes; Future  URI (upper respiratory infection)- this is improving, will cont the current cough suppressant -     chlorpheniramine-HYDROcodone (TUSSIONEX PENNKINETIC ER) 10-8 MG/5ML SUER; Take 5 mLs by mouth every 12 (twelve) hours as needed for cough.  Prostate cancer (HCC)  Cough -     chlorpheniramine-HYDROcodone (TUSSIONEX PENNKINETIC ER) 10-8 MG/5ML SUER; Take 5 mLs by mouth every 12 (twelve) hours as needed for cough.   I have discontinued Mr. Granier azithromycin and predniSONE. I am also having him maintain his brimonidine-timolol, dorzolamide, latanoprost, clobetasol cream, glucagon, olmesartan, hydrochlorothiazide, pregabalin, insulin lispro, insulin glargine, and chlorpheniramine-HYDROcodone.  Meds ordered this encounter  Medications  . chlorpheniramine-HYDROcodone (TUSSIONEX PENNKINETIC ER) 10-8 MG/5ML SUER    Sig: Take 5 mLs by mouth every 12 (twelve) hours as needed for cough.    Dispense:  140 mL    Refill:  0     Follow-up: Return in about 3 weeks (around 01/19/2016).  Scarlette Calico, MD

## 2015-12-29 NOTE — Progress Notes (Signed)
Pre visit review using our clinic review tool, if applicable. No additional management support is needed unless otherwise documented below in the visit note. 

## 2015-12-29 NOTE — Patient Instructions (Signed)

## 2016-01-06 ENCOUNTER — Encounter: Payer: Self-pay | Admitting: Internal Medicine

## 2016-01-06 DIAGNOSIS — H34831 Tributary (branch) retinal vein occlusion, right eye, with macular edema: Secondary | ICD-10-CM | POA: Diagnosis not present

## 2016-01-06 DIAGNOSIS — E083212 Diabetes mellitus due to underlying condition with mild nonproliferative diabetic retinopathy with macular edema, left eye: Secondary | ICD-10-CM | POA: Diagnosis not present

## 2016-01-06 DIAGNOSIS — E113291 Type 2 diabetes mellitus with mild nonproliferative diabetic retinopathy without macular edema, right eye: Secondary | ICD-10-CM | POA: Diagnosis not present

## 2016-01-06 DIAGNOSIS — H401133 Primary open-angle glaucoma, bilateral, severe stage: Secondary | ICD-10-CM | POA: Diagnosis not present

## 2016-01-06 LAB — HM DIABETES EYE EXAM

## 2016-01-09 NOTE — Patient Instructions (Signed)
Exam-specific instructions:     Medication and NPO instructions: Pt instructed to be npo after midnight, nothing to eat or drink.  Pt should take BP meds and lyrica in the am with sips of water.  Pt instructed to give himself a fleets enema on Tuesday evening and on Wednesday morning.      Clothing instructions: Instructed patient to wear loose, comfortable clothing.      Arrival instructions: Instructed patient to arrive 1000 via the Patient Entrance.  Instructed patient where the CV[facility] is located and about parking options.  Pt's daughter will be his designated driver.    Other reminders: Instructed to bring something to keep occupied in case of any unforeseen delays.

## 2016-01-09 NOTE — Addendum Note (Signed)
Addended by: Janith Lima on: 01/09/2016 01:22 PM   Modules accepted: Miquel Dunn

## 2016-01-11 ENCOUNTER — Encounter
Admission: RE | Disposition: A | Discharge status: Home / Self Care | Payer: Self-pay | Source: Ambulatory Visit | Attending: Interventional Radiology and Diagnostic Radiology

## 2016-01-11 ENCOUNTER — Ambulatory Visit: Payer: No Typology Code available for payment source | Admitting: Interventional Radiology and Diagnostic Radiology

## 2016-01-11 ENCOUNTER — Ambulatory Visit
Admission: RE | Admit: 2016-01-11 | Discharge: 2016-01-11 | Disposition: A | Discharge status: Home / Self Care | Payer: No Typology Code available for payment source | Source: Ambulatory Visit | Attending: Interventional Radiology and Diagnostic Radiology | Admitting: Interventional Radiology and Diagnostic Radiology

## 2016-01-11 DIAGNOSIS — I10 Essential (primary) hypertension: Secondary | ICD-10-CM | POA: Insufficient documentation

## 2016-01-11 DIAGNOSIS — Z794 Long term (current) use of insulin: Secondary | ICD-10-CM | POA: Insufficient documentation

## 2016-01-11 DIAGNOSIS — C61 Malignant neoplasm of prostate: Secondary | ICD-10-CM | POA: Insufficient documentation

## 2016-01-11 DIAGNOSIS — E119 Type 2 diabetes mellitus without complications: Secondary | ICD-10-CM | POA: Insufficient documentation

## 2016-01-11 LAB — GLUCOSE WHOLE BLOOD - POCT
Whole Blood Glucose POCT: 333 mg/dL — ABNORMAL HIGH (ref 70–100)
Whole Blood Glucose POCT: 139 mg/dL — ABNORMAL HIGH (ref 70–100)

## 2016-01-11 SURGERY — EMBOLIZATION LOWER EXTREMITY/PELVIC ART.

## 2016-01-11 MED ORDER — FENTANYL CITRATE (PF) 50 MCG/ML IJ SOLN (WRAP)
INTRAMUSCULAR | Status: AC | PRN
Start: 2016-01-11 — End: 2016-01-11
  Administered 2016-01-11: 50 ug via INTRAVENOUS

## 2016-01-11 MED ORDER — GLUCOSE 40 % PO GEL
15.0000 g | ORAL | Status: DC | PRN
Start: 2016-01-11 — End: 2016-01-11
  Filled 2016-01-11: qty 15

## 2016-01-11 MED ORDER — INSULIN LISPRO 100 UNIT/ML SC SOLN
1.0000 [IU] | SUBCUTANEOUS | Status: DC | PRN
Start: 2016-01-11 — End: 2016-01-11
  Administered 2016-01-11: 7 [IU] via SUBCUTANEOUS
  Filled 2016-01-11 (×6): qty 0.08

## 2016-01-11 MED ORDER — FLUMAZENIL 0.5 MG/5ML IV SOLN
INTRAVENOUS | Status: DC
Start: 2016-01-11 — End: 2016-01-11
  Filled 2016-01-11: qty 5

## 2016-01-11 MED ORDER — SODIUM CHLORIDE 0.9 % IV MBP
1.0000 g | Freq: Once | INTRAVENOUS | Status: AC
Start: 2016-01-11 — End: 2016-01-11
  Administered 2016-01-11: 1 g via INTRAVENOUS
  Filled 2016-01-11: qty 1000

## 2016-01-11 MED ORDER — MIDAZOLAM HCL 5 MG/5ML IJ SOLN
INTRAMUSCULAR | Status: AC | PRN
Start: 2016-01-11 — End: 2016-01-11
  Administered 2016-01-11: 1 mg via INTRAVENOUS

## 2016-01-11 MED ORDER — NALOXONE HCL 0.4 MG/ML IJ SOLN (WRAP)
INTRAMUSCULAR | Status: DC
Start: 2016-01-11 — End: 2016-01-11
  Filled 2016-01-11: qty 1

## 2016-01-11 MED ORDER — GLUCAGON 1 MG IJ SOLR (WRAP)
1.0000 mg | INTRAMUSCULAR | Status: DC | PRN
Start: 2016-01-11 — End: 2016-01-11

## 2016-01-11 MED ORDER — FENTANYL CITRATE (PF) 50 MCG/ML IJ SOLN (WRAP)
INTRAMUSCULAR | Status: AC
Start: 2016-01-11 — End: 2016-01-11
  Filled 2016-01-11: qty 5

## 2016-01-11 MED ORDER — DEXTROSE 50 % IV SOLN
25.0000 mL | INTRAVENOUS | Status: DC | PRN
Start: 2016-01-11 — End: 2016-01-11

## 2016-01-11 MED ORDER — MIDAZOLAM HCL 5 MG/5ML IJ SOLN
INTRAMUSCULAR | Status: AC
Start: 2016-01-11 — End: 2016-01-11
  Filled 2016-01-11: qty 5

## 2016-01-11 NOTE — H&P (Signed)
[facility]  [facility]   Outpatient History and Physical         Date Time: 01/11/2016 10:48 AM  Patient Name: Roy Delgado, Roy Delgado.  Referring Provider:  Leandra Kern       Assessment/Plan:     Diagnosis: Prostate cancer  Procedure: Prostate fiduciary marker placement    The patient is medically stable and appropriate to undergo the planned procedure. All risks and alternatives were discussed with the patient and he agrees to proceed.      History of Present Illness:   Roy Paluch. is a 80 y.o. male who presents today as an outpatient for prostate fiduciary marker placement.  Patient was diagnosed with prostate cancer last Fall.  He is preparing to start radiation therapy.  The patient states he did his Fleet enemas last night and this morning as instructed.  He did not have a ESBL rectal swab performed.        Past Medical History:     Past Medical History   Diagnosis Date   . Diabetes    . Hypertension    . Glaucoma        Past Surgical History:     Past Surgical History   Procedure Laterality Date   . Appendectomy 1991         Allergies:   No Known Allergies    Medications:     Prior to Admission medications    Medication Sig Start Date End Date Taking? Authorizing Provider   Brimonidine Tartrate-Timolol (COMBIGAN OP) Apply 1 drop to eye 2 (two) times daily.   Yes [provider]   hydrochlorothiazide (HYDRODIURIL) 25 MG tablet Take 25 mg by mouth daily.   Yes [provider]   insulin glargine (LANTUS) 100 UNIT/ML injection Inject 27 Units into the skin daily.   Yes [provider]   Insulin Lispro (HUMALOG SC) Inject 15 Units into the skin 3 (three) times daily.   Yes [provider]   olmesartan (BENICAR) 40 MG tablet Take 40 mg by mouth daily.   Yes [provider]   pregabalin (LYRICA) 100 MG capsule Take 100 mg by mouth 2 (two) times daily.   Yes [provider]     Current Facility-Administered Medications    Medication Dose Route Frequency Provider Last Rate Last Dose   . cefTRIAXone (ROCEPHIN) 1 g in sodium chloride 0.9 % 100 mL IVPB mini-bag plus  1 g Intravenous Once Monasia Lair J, PA       . dextrose (GLUCOSE) 40 % oral gel 15 g of glucose  15 g of glucose Oral PRN Azelia Reiger J, PA        And   . dextrose 50 % bolus 25 mL  25 mL Intravenous PRN Gordon Vandunk, Grayland Ormond, PA        And   . glucagon (rDNA) (GLUCAGEN) injection 1 mg  1 mg Intramuscular PRN Lejon Afzal J, PA       . insulin lispro (HumaLOG) injection 1-8 Units  1-8 Units Subcutaneous Q4H PRN Gail Creekmore, Grayland Ormond, PA           Family History:     Family History   Problem Relation Age of Onset   . Colon cancer Father        Social History:     Social History     Social History   . Marital Status: Married     Spouse Name: N/A   .  Number of Children: N/A   . Years of Education: N/A     Social History Main Topics   . Smoking status: Never Smoker    . Smokeless tobacco: Never Used   . Alcohol Use: 0.0 oz/week     0 Standard drinks or equivalent per week      Comment: 2 drinks a month   . Drug Use: No   . Sexual Activity: Not on file     Other Topics Concern   . Not on file     Social History Narrative   . No narrative on file       Review of Systems:   History obtained from chart review and the patient    Pertinent items are noted in HPI.    Physical Exam:     Filed Vitals:    01/11/16 1029   BP: 152/74   Temp: 98.6 F (37 C)        APPEARANCE:  Alert, no distress  NECK:  Neck supple, no adenopathy, no masses  HEART:  RRR with normal S1 and S2 ,no murmurs, no gallops  LUNGS:  Clear to auscultation, no wheezing, no retraction, no stridor, good air exchange.  ABDOMEN:  Abdomen soft, non-tender. BS normal. No masses,  No organomegaly  EXTREMITIES:  Negative clubbing, cyanosis, edema  NEURO: Awake, alert and oriented x 3      ASA Physical Status:   []    ASA 1  Healthy patient  []    ASA 2  Mild systemic illness  [x]    ASA 3  Systemic disease, though  not incapacitating  []    ASA 4  Severe systemic disease that is a constant threat to life   []    ASA 5  Moribund condition, patient unexpected to live >24 hours, irrespective of         procedure  []    E         Emergent procedure    Mallampati Score:   []  1  [x]  2  []  3  []  4  []  Intubated  []  Tracheostomy    Planned sedation:   []  No sedation  []  Local  [x]  Moderate sedation  []  Deep sedation (with Anesthesiology present)  []  General Anesthesia     Airway Assesment:   [x]  Normal  [] Compromised    Signed by: Roylene Reason, PA  [facility] Department  [facility] [phone]

## 2016-01-11 NOTE — Progress Notes (Signed)
Patient returned from procedure. Patient awakens to voice. VS stable.

## 2016-01-11 NOTE — Progress Notes (Signed)
[facility] for fiduciary marker of prostate gland. Patient awake and orientedx4. VS stable  See pre procedure screening too for further data

## 2016-01-11 NOTE — Discharge Instructions (Signed)
[address]  [place], [place] [address]  (phone) [phone]   (fax) [phone]        GENERAL INVASIVE PROCEDURES DISCHARGE INSTRUCTIONS      ACTIVITY:  ? Go home and rest until next day  ? No strenuous activity day of procedure  ? Nurse will instruct you on when to resume activity    SPECIAL INSTRUCTIONS:  ? If you were given sedation for your procedure, do not sign any legal documents or operate any equipment which requires mental alertness (driving) for the next 24 hours.  ? May resume regular diet  ? No alcohol for 24 hours.  ? May take regular medications.    POST PROCEDURE PAIN MANAGEMENT:  ? Mild analgesic (Tylenol, advil, motrin, etc ) may be taken for mild to moderate pain.  ? Unless instructed to do so, no heat to puncture site for 12 hours post procedure.    CALL CVIR FOR:  ? Bleeding or abnormal drainage from puncture site.  If bleeding occurs, be prepared to apply pressure to site.  ? Severe pain not controlled by mild to moderate analgesics for pain control.  ? Severe nausea or continued vomiting.  ? Fever greater than 101  ? Any signs of infection at puncture site (red streaks, purulent drainage, foul odor, swelling, severe pain, site warm or hot to touch)

## 2016-01-11 NOTE — Sedation Documentation (Signed)
Dr. Welton Flakes    Markers deployed (3)

## 2016-01-11 NOTE — Progress Notes (Signed)
Patient meets criteria for discharge. VS stable. No rectal bleeding. Patient mentation at baseline. Wife and patient given discharge instructions and denied further questions. IV removed. Patient denied SOB, chest pain, and pressure.

## 2016-01-11 NOTE — Brief Op Note (Signed)
[facility]  [facility]   Brief Op Note     Physician(s): Skyleigh Windle C Keiandra Sullenger, MD    Assistant(s):  None    Pre-operative Diagnosis: Prostate CA    Post-operative Diagnosis: Diagnosis is same as preop diagnosis    Procedure(s) Performed: Transrectal fiducial marker placement                                                                                                  Anesthesia:  Moderate Sedation and Local with 1% Lidocaine    Complications: None    Estimated Blood Loss:  None    Blood Aministered:  None    Fluid Aministered:  Per Nursing    Tubes and Drains: None    Implant(s): 3 gold fiducials placed    Specimens: None    Findings:  Successful trans-rectal fiducial marker placement. Two markers at the base and one at the apex.    Patient was transferred from the procedure room to the [facility] in stable condition.  Procedure note to be dictated.    Signed by: Hope Pigeon, MD  CV[facility] Department  [facility] [phone]  [facility] [phone]

## 2016-01-18 ENCOUNTER — Ambulatory Visit: Payer: No Typology Code available for payment source | Attending: Urology

## 2016-01-18 DIAGNOSIS — Z510 Encounter for antineoplastic radiation therapy: Secondary | ICD-10-CM | POA: Insufficient documentation

## 2016-01-18 DIAGNOSIS — C61 Malignant neoplasm of prostate: Secondary | ICD-10-CM | POA: Insufficient documentation

## 2016-01-18 MED ORDER — LIDOCAINE(URO-JET) 2% JELLY (WRAP)
CUTANEOUS | Status: AC
Start: 2016-01-18 — End: 2016-01-19
  Filled 2016-01-18: qty 1

## 2016-01-18 NOTE — Progress Notes (Signed)
[facility]- Patient completed the Chronic Illness Distress Scale-Short form (CIDS-SF) prior to simulation appointment for radiation therapy. Scale completed on 01/18/16. Score was 0, no concerns listed on concern checklist.  Chart reviewed.  Will follow as needed.   Rose Phi, MSW   Oncology Therapist  [facility]  [facility]  [phone] (direct)

## 2016-01-19 NOTE — Progress Notes (Signed)
Radiation Oncology  Nutrition Assessment    Zamari Vea. 80 y.o. male   MRN: [id number]      NUTRITION:  Adm dx:    Pr Ca  psa 8.7,  Gl 7  Tx:   Lupron      40 XRT    There is no problem list on file for this patient.     has a past medical history of Diabetes; Hypertension; and Glaucoma.   has past surgical history that includes appendectomy 1991.    Current Outpatient Rx   Name  Route  Sig  Dispense  Refill   . Brimonidine Tartrate-Timolol (COMBIGAN OP)    Ophthalmic    Apply 1 drop to eye 2 (two) times daily.             . hydrochlorothiazide (HYDRODIURIL) 25 MG tablet    Oral    Take 25 mg by mouth daily.             . insulin glargine (LANTUS) 100 UNIT/ML injection    Subcutaneous    Inject 27 Units into the skin daily.             . Insulin Lispro (HUMALOG SC)    Subcutaneous    Inject 15 Units into the skin 3 (three) times daily.             Marland Kitchen olmesartan (BENICAR) 40 MG tablet    Oral    Take 40 mg by mouth daily.             . pregabalin (LYRICA) 100 MG capsule    Oral    Take 100 mg by mouth 2 (two) times daily.                  Wt Readings from Last 3 Encounters:   01/09/16 96.163 kg (212 lb)   11/08/15 99.474 kg (219 lb 4.8 oz)     Estimated body mass index is 28.34 kg/(m^2) as calculated from the following:    Height as of 01/09/16: 1.842 m (6' 0.5").    Weight as of 01/11/16: 96.163 kg (212 lb).     Estimated needs:   2100-2250 cal/d  msj      85-100 gm prot/d      2100-2500 cc fluid/d      115%  IBW      @Social /Diet History:  Pt is here with his wife for a SIM today. The couple are living in [place] but they will be staying with their dhtr in [place] during his xrt treatment for Pr Ca. Both pt and spouse have insulin dependent DM. Pt reports that his FBS range from 57 to 100 and his HS BS are between 300-400. They have both taken classes for DM education in the past. Pt has no cigarette hx and drinks occasional etoh.His food recall for yesterday indicates that he eats 3 meals per day with prot at each meal.  Diet also seems to be high in fruits and veg and does include reg sodas. Pt reports a sedentary lifestyle - his hobby is geneology. Very little exercise. Both pt and spouse are very pleasant and personable but it was difficult to keep them on topic.     Nutrition Diagnosis:    Increased nutrient needs (calcium) related to ADT for pr ca as evid by food recall.   Food and nutrition knowledge deficit related to new dx and tx as evidenced by request for meeting with RD and  comments and questions during discussion.    Intervention:    1. Continue present regimen. Avoid sugared beverages. Discussed urinary irritants.  2. Change diet to pelvic diet as needed.  3. Recom 1200 mg calc and 2000 iu vit D per day for bone health - 2 Citracal Slow Release or 4 servings of dairy per day or combination.  4. Add 3 days of strength training per day and aerobic exercise on other days to help improve BS control and help maintain lbm and bone mass.  5. Attend [facility]  6. Discussed the above at length. Understanding does not seem good. Will reinforce.  7. RD will remain available. Left card.    Monitoring and Evaluation:     Monitor at low risk per consult or pt request.    Lynita Lombard RD, CSO  [phone]  [facility]  [address]  [place], [place] [address]

## 2016-01-27 DIAGNOSIS — Z510 Encounter for antineoplastic radiation therapy: Secondary | ICD-10-CM | POA: Insufficient documentation

## 2016-01-27 DIAGNOSIS — C61 Malignant neoplasm of prostate: Secondary | ICD-10-CM | POA: Insufficient documentation

## 2016-01-27 NOTE — Progress Notes (Signed)
Pt. Had ADT on 11/14/15.

## 2016-01-30 DIAGNOSIS — C61 Malignant neoplasm of prostate: Secondary | ICD-10-CM | POA: Diagnosis not present

## 2016-01-30 DIAGNOSIS — M79606 Pain in leg, unspecified: Secondary | ICD-10-CM | POA: Diagnosis not present

## 2016-01-30 DIAGNOSIS — M7989 Other specified soft tissue disorders: Secondary | ICD-10-CM | POA: Diagnosis not present

## 2016-01-31 ENCOUNTER — Ambulatory Visit: Payer: No Typology Code available for payment source | Attending: Radiation Oncology

## 2016-01-31 ENCOUNTER — Other Ambulatory Visit: Payer: Self-pay

## 2016-01-31 DIAGNOSIS — C61 Malignant neoplasm of prostate: Secondary | ICD-10-CM | POA: Diagnosis not present

## 2016-01-31 DIAGNOSIS — Z510 Encounter for antineoplastic radiation therapy: Secondary | ICD-10-CM | POA: Diagnosis not present

## 2016-01-31 NOTE — Patient Instructions (Signed)
Supportive care for men receiving  radiation therapy to the prostate      This information will help you manage side effects you may feel from radiation therapy. Your care team members also can answer any questions that you may have. If antibiotics are prescribed, take all the medication as directed.    Skin Care    You are likely to have very few changes in your skin. Any pubic hair that you begin to lose after several weeks of treatment will grow back within a few months after treatment ends. The area in the crease just below your tailbone may become reddened and sensitive.     . Wash skin gently with warm water and mild soap   . Rinse well, and pat dry   . Do not apply extreme heat or cold to treated skin   . Keep treated skin out of direct sunlight until the redness is gone   . Use SPF 25 sunscreen on treated skin that has healed if area is exposed to   direct sunlight    Bladder Irritation    You may need to urinate more frequently. Limiting fluids in the evening can decrease nighttime urination. Tell your nurse if these symptoms are bothersome, as there are medications that   may help.    Do not urinate 1 - 2 hours prior to radiation treatment in order to reduce the amount of radiation to the bladder. You may empty your bladder after treatment.    Rectal Irritation    You may notice an increase in the frequency of bowel movements (stool), mucous in your stool, or itching and soreness around your anus (rectal opening), especially if you have hemorrhoids. If irritation occurs, these suggestions may help:     . Avoid rubbing, scratching or scrubbing your rectal area   . Use non-alcohol baby wipes after every bowel movement   . Soak in a tub of warm (never hot) water 2 to 3 times daily   . Keep the area dry      Supportive Care (continued)          Diarrhea    Diarrhea may occur after 2 to 3 weeks of therapy. If you develop frequent bowel movements, you may take a dose of Imodium AD, available without a  prescription, after each loose bowel movement. Do not take more than 8 doses a day without first checking with your doctor or nurse. Limit spicy and greasy foods.    Fatigue    You may feel unusually tired as your body reacts to the effects of radiation. Stress related to your illness, and changes in appetite also may contribute to fatigue. The amount of fatigue varies with each person and does not occur immediately. It seems to be cumulative, however, you may notice it more as your therapy progresses. Fatigue gradually fades after your treatment ends.    Sexual Activity    You may continue regular sexual activity throughout your treatment, but it is absolutely necessary to use an approved very effective form of contraception (birth control). You will not be radioactive, and there is no danger to your partner from the radiation or your cancer.    After Your Treatment Ends    You bladder irritation will usually go away within 4 to 6 weeks. In the meantime, continue with whatever medication your doctor prescribed.    Your bowels will gradually return to normal over the next 2 to 3 weeks. If diarrhea continues, stay on   the suggested medications and diet, and report this to your doctor.      Antioxidants and Radiation Treatment      Antioxidants are vitamins and minerals that have been shown to protect cells from the potential damage of free radical production. Examples of antioxidants include vitamin C, vitamin E, beta carotene, Coenzyme Q10, glutathione, N-acetylcysteine (NAC), selenium, genistein, diadzein, and quercetin.    Some individuals with cancer take large amounts of these vitamins and minerals in hopes that they might enhance their immune system or even destroy cancer cells. We strongly advise against this practice during radiation therapy. Studies have shown that some of these substances may interfere with the tumor killing effects of radiation treatment. Radiation treatment works by creating free radicals,  which kill the cancer cells. There is some evidence in animal studies and cell studies that antioxidants might block this mechanism by neutralizing the free radicals.    Many people feel that if a pill or supplement is available for sale that the products are safe and that the claims are true. The FDA does not control these products. At this time, there are no standards for safety, quality, contamination, or dose recommendations for these products. The USP seal on the label of vitamins and minerals signifies that the product has undergone a voluntary inspection by the [place] Pharmacopoeia which assess for absorption, contamination, and dosage.    A supplement that contains 100% of the RDA is considered to be beneficial for adults during radiation treatment and/or chemotherapy. Feel free to discuss your supplements with the cancer center dietitian or your physician.    In addition, you are encouraged to eat as healthfully as possible before, during, and after cancer treatment. Include fruits and vegetables that contain a wide variety of naturally occurring antioxidants in your daily diet.

## 2016-01-31 NOTE — Progress Notes (Signed)
[facility]-  Met with patient and his wife following his verification film to review the results of his distress screening, do a brief psychosocial assessment, and discuss [facility] services. Patient and his wife share that they are staying with their daughter in [place] during the course of treatment.  They share that they normally live in [place], down the street from their son.  The patient stated he was in [employer] for 22 years and after retiring worked for [employer].  He offers he was apprehensive prior to today's visit resulting in poor sleep last night, however he was feeling better now that the appointment was done. He offers that sleep is rarely a challenge for him.  He notes no problem with his appetite.  He denies any logistical challenges such as insurance, finance, or transportation, assuming the patient will continue to be able to drive through the course of treatment. The patient states he has no history of anxiety or depression.  Distress screening was reviewed.  Score was 0 with no concerns on the concern checklist.  [facility] services were discussed. The patient's wife expressed interest in many of the [facility] programs. Writer's contact information and [facility] documentation were left with the patient. Will continue to follow as needed.    Rose Phi, MSW   Oncology Therapist  [facility]  [facility]  [phone] (direct)

## 2016-02-01 ENCOUNTER — Ambulatory Visit: Payer: Self-pay | Admitting: Radiation Oncology

## 2016-02-01 ENCOUNTER — Encounter: Payer: Self-pay | Admitting: Radiation Oncology

## 2016-02-01 DIAGNOSIS — C61 Malignant neoplasm of prostate: Secondary | ICD-10-CM | POA: Insufficient documentation

## 2016-02-01 DIAGNOSIS — Z510 Encounter for antineoplastic radiation therapy: Secondary | ICD-10-CM | POA: Diagnosis not present

## 2016-02-01 NOTE — Progress Notes (Signed)
02/01/16 1506   Radiation Therapy Patient Care    Current Dose  2   Current Fraction Number 360   Planned Total Dose 7920   Planned Total Number of Fractions 44   Planned Boost  No   Comfort Alteration   Karnofsky Performance Score 100%-normal, no complaints   Fatigue 2   Pain Score 0   Elimination Alteration    Diarrhea 0-none   BMs/day  1   Rectal Bleeding 0 - None   Renal/Genitourinary   Urinary Frequency, Urgency 0-normal   Dysuria 0-none   Daytime Frequency 2-4 hours   Vital signs   Temp 97.5 F (36.4 C)   Temp Source Temporal Art   Heart Rate 73   Resp Rate 18   BP 167/78 mmHg   Doing well.  No complaints.  Nocturia x 0.

## 2016-02-01 NOTE — Progress Notes (Signed)
[place] RADIATION ONCOLOGY WEEKLY TREATMENT NOTE    Date Time:  9:29 PM 02/01/2016   Patient Name: Roy Delgado, Roy Delgado.  Diagnosis:   1. 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  He started ADT 11/14/2015.      NURSING ASSESSMENT   02/01/16 1506     Radiation Therapy Patient Care     Current Dose  2     Current Fraction Number  360     Planned Total Dose  7920     Planned Total Number of Fractions  44     Planned Boost  No     Comfort Alteration     Karnofsky Performance Score  100%-normal, no complaints     Fatigue  2     Pain Score  0     Elimination Alteration     Diarrhea  0-none     BMs/day  1     Rectal Bleeding  0 - None     Renal/Genitourinary     Urinary Frequency, Urgency  0-normal     Dysuria  0-none     Daytime Frequency  2-4 hours     Vital signs     Temp  97.5 F (36.4 C)     Temp Source  Temporal Art     Heart Rate  73     Resp Rate  18     BP  167/78 mmHg     Doing well. No complaints. Nocturia x 0.        PHYSICIAN ASSESSMENT    SUBJECTIVE:   Roy Diguglielmo. is a 80 y.o. male who is seen today for on-treatment clinical management during radiation therapy.     Pertinent subjective/objective findings are documented in the above flowsheet.  Patient is doing well.  Notes urinary frequency and nocturia only with blood glucose is elevated (diabetic history).  He denies dysuria, hematuria, or diarrhea.        Objective Findings:   BP 167/78 mmHg  Pulse 73  Temp(Src) 97.5 F (36.4 C) (Temporal Artery)  Resp 18  Wt 99.927 kg (220 lb 4.8 oz)  Gen:NAD, sitting comfortably in chair  Pelvis: deferred     ASSESSMENT:     Tolerating treatment with expected toxicities given dose.    Plan:  1.Continue Radiation as planned with ADT.   2. Continue monitoring urinary and bowel symptoms.     Evaluated by covering physician.    Acie Fredrickson, MD MS  Department of Radiation  Oncology  [facility]

## 2016-02-02 DIAGNOSIS — Z510 Encounter for antineoplastic radiation therapy: Secondary | ICD-10-CM | POA: Diagnosis not present

## 2016-02-02 DIAGNOSIS — C61 Malignant neoplasm of prostate: Secondary | ICD-10-CM | POA: Diagnosis not present

## 2016-02-03 DIAGNOSIS — C61 Malignant neoplasm of prostate: Secondary | ICD-10-CM | POA: Diagnosis not present

## 2016-02-03 DIAGNOSIS — Z510 Encounter for antineoplastic radiation therapy: Secondary | ICD-10-CM | POA: Diagnosis not present

## 2016-02-06 DIAGNOSIS — C61 Malignant neoplasm of prostate: Secondary | ICD-10-CM | POA: Diagnosis not present

## 2016-02-06 DIAGNOSIS — Z510 Encounter for antineoplastic radiation therapy: Secondary | ICD-10-CM | POA: Diagnosis not present

## 2016-02-07 ENCOUNTER — Ambulatory Visit: Payer: Self-pay | Admitting: Radiation Oncology

## 2016-02-07 ENCOUNTER — Encounter: Payer: Self-pay | Admitting: Radiation Oncology

## 2016-02-07 DIAGNOSIS — C61 Malignant neoplasm of prostate: Secondary | ICD-10-CM

## 2016-02-07 DIAGNOSIS — Z510 Encounter for antineoplastic radiation therapy: Secondary | ICD-10-CM | POA: Diagnosis not present

## 2016-02-07 NOTE — Progress Notes (Signed)
02/07/16 1431   Radiation Therapy Patient Care    Current Dose  6   Current Fraction Number 1080   Planned Total Dose 7920   Planned Total Number of Fractions 44   Planned Boost  No   Comfort Alteration   Karnofsky Performance Score 100%-normal, no complaints   Fatigue 3   Pain Score 10  (right)   Pain Loc KNEE   Elimination Alteration    Diarrhea 1 - Increase of < 4 stools/day over baseline pretreatment   BMs/day  1   Rectal Bleeding 0 - None   Renal/Genitourinary   Urinary Frequency, Urgency 1-increase in frequency or nocturia up to 2 x normal   Dysuria 0-none   Daytime Frequency 2-4 hours   Vital signs   Temp 98 F (36.7 C)   Temp Source Temporal Art   Heart Rate 80   Resp Rate 18   BP 136/75 mmHg   Urgency.  Nocturia x 2 if blood sugar under control.  Diarrhea today - 3 stools today. Took Imodium.   To provide a copy of the pelvic diet.

## 2016-02-07 NOTE — Progress Notes (Signed)
[place] RADIATION ONCOLOGY WEEKLY TREATMENT NOTE    Date Time:  4:18 PM 02/07/2016   Patient Name: Roy Delgado, PUERTAS.  Diagnosis:   1. 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  He started ADT 11/14/2015.      NURSING ASSESSMENT   02/07/16 1431    Radiation Therapy Patient Care    Current Dose  6    Current Fraction Number  1080    Planned Total Dose  7920    Planned Total Number of Fractions  44    Planned Boost  No    Comfort Alteration    Karnofsky Performance Score  100%-normal, no complaints    Fatigue  3    Pain Score  10  (right)    Pain Loc  KNEE    Elimination Alteration    Diarrhea  1 - Increase of < 4 stools/day over baseline pretreatment    BMs/day  1    Rectal Bleeding  0 - None    Renal/Genitourinary    Urinary Frequency, Urgency  1-increase in frequency or nocturia up to 2 x normal    Dysuria  0-none    Daytime Frequency  2-4 hours    Vital signs    Temp  98 F (36.7 C)    Temp Source  Temporal Art    Heart Rate  80    Resp Rate  18    BP  136/75 mmHg    Urgency. Nocturia x 2 if blood sugar under control. Diarrhea today - 3 stools today. Took Imodium.   To provide a copy of the pelvic diet      PHYSICIAN ASSESSMENT    SUBJECTIVE:   Roy Delgado. is a 80 y.o. male who is seen today for on-treatment clinical management during radiation therapy.     Pertinent subjective/objective findings are documented in the above flowsheet.  Patient is doing well.  Notes urinary frequency and nocturia only with blood glucose is elevated (diabetic history).  He denies dysuria, hematuria.  Has had 2-3 episodes of looser stool intermittently over the past week, took imodium today with resolution.  No nausea, sick contacts, fevers.       Objective Findings:   BP 136/75 mmHg  Pulse 80  Temp(Src) 98 F (36.7 C) (Temporal Artery)  Resp 18  Wt 96.979 kg (213 lb 12.8  oz)  Gen:NAD, sitting comfortably in chair  Pelvis: deferred     ASSESSMENT:     Tolerating treatment with expected toxicities given dose.    Plan:  1.Continue Radiation as planned with ADT.   2. Continue monitoring urinary and bowel symptoms. Imodium as needed, pelvic diet info provided.    Evaluated by covering physician.    Acie Fredrickson, MD MS  Department of Radiation Oncology  [facility]

## 2016-02-08 DIAGNOSIS — Z510 Encounter for antineoplastic radiation therapy: Secondary | ICD-10-CM | POA: Diagnosis not present

## 2016-02-08 DIAGNOSIS — C61 Malignant neoplasm of prostate: Secondary | ICD-10-CM | POA: Diagnosis not present

## 2016-02-09 DIAGNOSIS — Z510 Encounter for antineoplastic radiation therapy: Secondary | ICD-10-CM | POA: Diagnosis not present

## 2016-02-09 DIAGNOSIS — C61 Malignant neoplasm of prostate: Secondary | ICD-10-CM | POA: Diagnosis not present

## 2016-02-10 DIAGNOSIS — C61 Malignant neoplasm of prostate: Secondary | ICD-10-CM | POA: Diagnosis not present

## 2016-02-10 DIAGNOSIS — Z510 Encounter for antineoplastic radiation therapy: Secondary | ICD-10-CM | POA: Diagnosis not present

## 2016-02-13 DIAGNOSIS — C61 Malignant neoplasm of prostate: Secondary | ICD-10-CM | POA: Diagnosis not present

## 2016-02-13 DIAGNOSIS — E1040 Type 1 diabetes mellitus with diabetic neuropathy, unspecified: Secondary | ICD-10-CM | POA: Diagnosis not present

## 2016-02-13 DIAGNOSIS — Z510 Encounter for antineoplastic radiation therapy: Secondary | ICD-10-CM | POA: Diagnosis not present

## 2016-02-14 ENCOUNTER — Ambulatory Visit: Payer: Self-pay | Admitting: Radiation Oncology

## 2016-02-14 ENCOUNTER — Encounter: Payer: Self-pay | Admitting: Radiation Oncology

## 2016-02-14 DIAGNOSIS — C61 Malignant neoplasm of prostate: Secondary | ICD-10-CM

## 2016-02-14 DIAGNOSIS — Z510 Encounter for antineoplastic radiation therapy: Secondary | ICD-10-CM | POA: Diagnosis not present

## 2016-02-14 NOTE — Progress Notes (Signed)
02/14/16 1450   Radiation Therapy Patient Care    Current Dose  11   Current Fraction Number 1980   Planned Total Dose 7920   Planned Total Number of Fractions 44   Planned Boost  No   Comfort Alteration   Karnofsky Performance Score 90%-can perform normal activity, minor signs of disease   Fatigue 2   Pain Score 6   Pain Loc KNEE   Elimination Alteration    Diarrhea 0-none   BMs/day  1   Rectal Bleeding 0 - None   Renal/Genitourinary   Urinary Frequency, Urgency 1-increase in frequency or nocturia up to 2 x normal   Dysuria 0-none   Daytime Frequency 2-4 hours   Vital signs   Temp 98.1 F (36.7 C)   Temp Source Temporal Art   Heart Rate 71   Resp Rate 18   BP 113/72 mmHg   Doing fairly well.  Mild fatigue, persistent pain in the right knee, no diarrhea, rectal irritation or dysuria.  Nocturia x 1-4.

## 2016-02-15 DIAGNOSIS — Z510 Encounter for antineoplastic radiation therapy: Secondary | ICD-10-CM | POA: Diagnosis not present

## 2016-02-15 DIAGNOSIS — C61 Malignant neoplasm of prostate: Secondary | ICD-10-CM | POA: Diagnosis not present

## 2016-02-16 DIAGNOSIS — C61 Malignant neoplasm of prostate: Secondary | ICD-10-CM | POA: Diagnosis not present

## 2016-02-16 DIAGNOSIS — Z510 Encounter for antineoplastic radiation therapy: Secondary | ICD-10-CM | POA: Diagnosis not present

## 2016-02-16 NOTE — Progress Notes (Signed)
[place] RADIATION ONCOLOGY WEEKLY TREATMENT NOTE    Date Time:  9:55 PM 02/14/2016   Patient Name: Roy Delgado, Roy Delgado.  Diagnosis:   1. 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  He started ADT 11/14/2015.      NURSING ASSESSMENT   02/14/16 1450    Radiation Therapy Patient Care    Current Dose  11    Current Fraction Number  1980    Planned Total Dose  7920    Planned Total Number of Fractions  44    Planned Boost  No    Comfort Alteration    Karnofsky Performance Score  90%-can perform normal activity, minor signs of disease    Fatigue  2    Pain Score  6    Pain Loc  KNEE    Elimination Alteration    Diarrhea  0-none    BMs/day  1    Rectal Bleeding  0 - None    Renal/Genitourinary    Urinary Frequency, Urgency  1-increase in frequency or nocturia up to 2 x normal    Dysuria  0-none    Daytime Frequency  2-4 hours    Vital signs    Temp  98.1 F (36.7 C)    Temp Source  Temporal Art    Heart Rate  71    Resp Rate  18    BP  113/72 mmHg    Doing fairly well. Mild fatigue, persistent pain in the right knee, no diarrhea, rectal irritation or dysuria. Nocturia x 1-4      PHYSICIAN ASSESSMENT    SUBJECTIVE:   Roy Leiner. is a 80 y.o. male who is seen today for on-treatment clinical management during radiation therapy.     Pertinent subjective/objective findings are documented in the above flowsheet.  Patient is doing well.  Notes urinary frequency and nocturia only with blood glucose is elevated (diabetic history).  He denies dysuria, hematuria, or diarrhea.    Objective Findings:   BP 113/72 mmHg  Pulse 71  Temp(Src) 98.1 F (36.7 C) (Temporal Artery)  Resp 18  Wt 100.2 kg (220 lb 14.4 oz)  SpO2 98%  Gen:NAD, sitting comfortably in chair  Pelvis: deferred     ASSESSMENT:     Tolerating treatment with expected toxicities given dose.    Plan:  1.Continue Radiation as  planned with ADT.   2. Continue monitoring urinary and bowel symptoms. Imodium as needed, pelvic diet info provided.    Evaluated by covering physician.    Acie Fredrickson, MD MS  Department of Radiation Oncology  [facility]

## 2016-02-17 DIAGNOSIS — C61 Malignant neoplasm of prostate: Secondary | ICD-10-CM | POA: Diagnosis not present

## 2016-02-17 DIAGNOSIS — Z510 Encounter for antineoplastic radiation therapy: Secondary | ICD-10-CM | POA: Diagnosis not present

## 2016-02-20 DIAGNOSIS — C61 Malignant neoplasm of prostate: Secondary | ICD-10-CM | POA: Diagnosis not present

## 2016-02-20 DIAGNOSIS — Z510 Encounter for antineoplastic radiation therapy: Secondary | ICD-10-CM | POA: Diagnosis not present

## 2016-02-21 ENCOUNTER — Encounter: Payer: Self-pay | Admitting: Radiation Oncology

## 2016-02-21 ENCOUNTER — Ambulatory Visit: Payer: Self-pay | Admitting: Radiation Oncology

## 2016-02-21 DIAGNOSIS — C61 Malignant neoplasm of prostate: Secondary | ICD-10-CM

## 2016-02-21 DIAGNOSIS — Z510 Encounter for antineoplastic radiation therapy: Secondary | ICD-10-CM | POA: Diagnosis not present

## 2016-02-21 NOTE — Progress Notes (Signed)
02/21/16 1430   Radiation Therapy Patient Care    Current Dose  16   Current Fraction Number [id number]   Planned Total Dose 7920   Planned Total Number of Fractions 44   Planned Boost  No   Comfort Alteration   Karnofsky Performance Score 90%-can perform normal activity, minor signs of disease   Fatigue 2   Pain Score 4   Pain Loc KNEE   Elimination Alteration    Diarrhea 0-none   BMs/day  1   Rectal Bleeding 0 - None   Renal/Genitourinary   Urinary Frequency, Urgency 1-increase in frequency or nocturia up to 2 x normal   Dysuria 0-none   Daytime Frequency 2-4 hours   Vital signs   Temp 98.6 F (37 C)   Temp Source Temporal Art   Heart Rate 68   Resp Rate 18   BP 154/77 mmHg   Has mild fatigue, no diarrhea, dysuria or rectal bleeding.  Nocturia x 2.

## 2016-02-21 NOTE — Progress Notes (Signed)
[place] RADIATION ONCOLOGY WEEKLY TREATMENT NOTE    Date Time:  2:35 PM 02/21/2016   Patient Name: Roy Delgado, Roy Delgado.  Diagnosis:   1. 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  He started ADT 11/14/2015.      NURSING ASSESSMENT   02/21/16 1430    Radiation Therapy Patient Care    Current Dose  16    Current Fraction Number  [id number]    Planned Total Dose  7920    Planned Total Number of Fractions  44    Planned Boost  No    Comfort Alteration    Karnofsky Performance Score  90%-can perform normal activity, minor signs of disease    Fatigue  2    Pain Score  4    Pain Loc  KNEE    Elimination Alteration    Diarrhea  0-none    BMs/day  1    Rectal Bleeding  0 - None    Renal/Genitourinary    Urinary Frequency, Urgency  1-increase in frequency or nocturia up to 2 x normal    Dysuria  0-none    Daytime Frequency  2-4 hours    Vital signs    Temp  98.6 F (37 C)    Temp Source  Temporal Art    Heart Rate  68    Resp Rate  18    BP  154/77 mmHg    Has mild fatigue, no diarrhea, dysuria or rectal bleeding. Nocturia x 2      PHYSICIAN ASSESSMENT    SUBJECTIVE:   Roy Delgado. is a 80 y.o. male who is seen today for on-treatment clinical management during radiation therapy.     Pertinent subjective/objective findings are documented in the above flowsheet.  Patient is doing well.  Notes urinary frequency and nocturia only with blood glucose is elevated (diabetic history).  He denies dysuria, hematuria, or diarrhea.    Objective Findings:   BP 154/77 mmHg  Pulse 68  Temp(Src) 98.6 F (37 C) (Temporal Artery)  Resp 18  Wt 100.562 kg (221 lb 11.2 oz)  Gen:NAD, sitting comfortably in chair  Pelvis: deferred     ASSESSMENT:     Tolerating treatment with expected toxicities given dose.    Plan:  1.Continue Radiation as planned with ADT.   2. Continue monitoring urinary and bowel  symptoms. Imodium as needed, pelvic diet info provided.    Evaluated by covering physician.    Acie Fredrickson, MD MS  Department of Radiation Oncology  [facility]

## 2016-02-22 ENCOUNTER — Ambulatory Visit: Payer: No Typology Code available for payment source | Attending: Urology

## 2016-02-22 DIAGNOSIS — C61 Malignant neoplasm of prostate: Secondary | ICD-10-CM | POA: Insufficient documentation

## 2016-02-22 DIAGNOSIS — Z510 Encounter for antineoplastic radiation therapy: Secondary | ICD-10-CM | POA: Insufficient documentation

## 2016-02-23 DIAGNOSIS — C61 Malignant neoplasm of prostate: Secondary | ICD-10-CM | POA: Diagnosis not present

## 2016-02-23 DIAGNOSIS — Z510 Encounter for antineoplastic radiation therapy: Secondary | ICD-10-CM | POA: Diagnosis not present

## 2016-02-24 DIAGNOSIS — C61 Malignant neoplasm of prostate: Secondary | ICD-10-CM | POA: Diagnosis not present

## 2016-02-24 DIAGNOSIS — Z510 Encounter for antineoplastic radiation therapy: Secondary | ICD-10-CM | POA: Diagnosis not present

## 2016-02-27 DIAGNOSIS — C61 Malignant neoplasm of prostate: Secondary | ICD-10-CM | POA: Diagnosis not present

## 2016-02-27 DIAGNOSIS — Z510 Encounter for antineoplastic radiation therapy: Secondary | ICD-10-CM | POA: Diagnosis not present

## 2016-02-28 ENCOUNTER — Ambulatory Visit: Payer: Self-pay | Admitting: Radiation Oncology

## 2016-02-28 ENCOUNTER — Encounter: Payer: Self-pay | Admitting: Radiation Oncology

## 2016-02-28 DIAGNOSIS — C61 Malignant neoplasm of prostate: Secondary | ICD-10-CM

## 2016-02-28 DIAGNOSIS — Z510 Encounter for antineoplastic radiation therapy: Secondary | ICD-10-CM | POA: Diagnosis not present

## 2016-02-28 NOTE — Progress Notes (Signed)
[place] RADIATION ONCOLOGY WEEKLY TREATMENT NOTE    Date Time:  2:47 PM 02/28/2016   Patient Name: Roy Delgado, Roy Delgado.  Diagnosis:   1. 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  He started ADT 11/14/2015.      NURSING ASSESSMENT   02/28/16 1437   Radiation Therapy Patient Care    Current Dose  21   Current Fraction Number [id number]   Planned Total Dose 7920   Planned Total Number of Fractions 44   Planned Boost  No   Comfort Alteration   Karnofsky Performance Score 90%-can perform normal activity, minor signs of disease   Pain Score 8   Pain Loc KNEE   Elimination Alteration    Diarrhea 0-none   BMs/day  1   Rectal Bleeding 0 - None   Renal/Genitourinary   Urinary Frequency, Urgency 1-increase in frequency or nocturia up to 2 x normal   Dysuria 0-none   Daytime Frequency 2-4 hours   Vital signs   Temp 98.1 F (36.7 C)   Temp Source Temporal Art   Heart Rate 68   Resp Rate 18   BP 127/63 mmHg   Doing well. No dysuria. Nocturia x 0.          PHYSICIAN ASSESSMENT    SUBJECTIVE:   Roy Delgado. is a 80 y.o. male who is seen today for on-treatment clinical management during radiation therapy.     Pertinent subjective/objective findings are documented in the above flowsheet.  Patient is doing well.  Had one night of severe nocturia every 30-60 min.  But realized later he had taken his wife's lasix medication before going to bed thinking it was tramadol.  Thereafter nocturia has only been 0-1X/night, notes ongoing urinary frequency in day related to blood glucose is elevated (diabetic history).  He denies dysuria, hematuria, or diarrhea.    Objective Findings:   BP 127/63 mmHg  Pulse 68  Temp(Src) 98.1 F (36.7 C) (Temporal Artery)  Resp 18  Wt 100.381 kg (221 lb 4.8 oz)  Gen:NAD, sitting comfortably in chair  Pelvis: deferred      ASSESSMENT:     Tolerating treatment with expected toxicities given dose.    Plan:  1.Continue Radiation as planned with ADT.   2. Continue monitoring urinary and bowel symptoms. Imodium as needed, pelvic diet info provided.    Evaluated by covering physician.    Acie Fredrickson, MD MS  Department of Radiation Oncology  [facility]

## 2016-02-28 NOTE — Progress Notes (Signed)
02/28/16 1437   Radiation Therapy Patient Care    Current Dose  21   Current Fraction Number [id number]   Planned Total Dose 7920   Planned Total Number of Fractions 44   Planned Boost  No   Comfort Alteration   Karnofsky Performance Score 90%-can perform normal activity, minor signs of disease   Pain Score 8   Pain Loc KNEE   Elimination Alteration    Diarrhea 0-none   BMs/day  1   Rectal Bleeding 0 - None   Renal/Genitourinary   Urinary Frequency, Urgency 1-increase in frequency or nocturia up to 2 x normal   Dysuria 0-none   Daytime Frequency 2-4 hours   Vital signs   Temp 98.1 F (36.7 C)   Temp Source Temporal Art   Heart Rate 68   Resp Rate 18   BP 127/63 mmHg   Doing well.  No dysuria.  Nocturia x 0.

## 2016-02-29 DIAGNOSIS — Z510 Encounter for antineoplastic radiation therapy: Secondary | ICD-10-CM | POA: Diagnosis not present

## 2016-02-29 DIAGNOSIS — C61 Malignant neoplasm of prostate: Secondary | ICD-10-CM | POA: Diagnosis not present

## 2016-03-01 DIAGNOSIS — C61 Malignant neoplasm of prostate: Secondary | ICD-10-CM | POA: Diagnosis not present

## 2016-03-01 DIAGNOSIS — Z510 Encounter for antineoplastic radiation therapy: Secondary | ICD-10-CM | POA: Diagnosis not present

## 2016-03-02 DIAGNOSIS — Z510 Encounter for antineoplastic radiation therapy: Secondary | ICD-10-CM | POA: Diagnosis not present

## 2016-03-02 DIAGNOSIS — C61 Malignant neoplasm of prostate: Secondary | ICD-10-CM | POA: Diagnosis not present

## 2016-03-05 ENCOUNTER — Ambulatory Visit: Payer: Self-pay | Admitting: Radiation Oncology

## 2016-03-05 DIAGNOSIS — C61 Malignant neoplasm of prostate: Secondary | ICD-10-CM

## 2016-03-05 DIAGNOSIS — R3915 Urgency of urination: Secondary | ICD-10-CM

## 2016-03-05 DIAGNOSIS — Z510 Encounter for antineoplastic radiation therapy: Secondary | ICD-10-CM | POA: Diagnosis not present

## 2016-03-05 MED ORDER — TAMSULOSIN HCL 0.4 MG PO CAPS
0.4000 mg | ORAL_CAPSULE | Freq: Every day | ORAL | Status: DC
Start: 2016-03-05 — End: 2016-07-11

## 2016-03-05 NOTE — Progress Notes (Signed)
Date Time: NOW@ 03/05/2016   Patient Name: Roy Delgado.  Diagnosis:   1. Urgency of urination  tamsulosin (FLOMAX) 0.4 MG Cap   2. Prostate cancer  tamsulosin (FLOMAX) 0.4 MG Cap       RADIATION ONCOLOGY ON-TREATMENT VISIT NOTE    37.8Gy in 21 fractions completed to date.     ID:  Roy Delgado is 80 y.o. male with diagnosis of unfavorable intermediate risk prostate adenocarcinoma (PSA 8.7, GS 4+3, cT1c) who is to receive definitive EBRT with 6 months of ADT. He started ADT 11/14/2015.      SUBJECTIVE:  Increased urinary urgency/frequency.  No burning sensation.     . Brimonidine Tartrate-Timolol (COMBIGAN OP)   . hydrochlorothiazide (HYDRODIURIL) 25 MG tablet   . insulin glargine (LANTUS) 100 UNIT/ML injection   . Insulin Lispro (HUMALOG SC)   . olmesartan (BENICAR) 40 MG tablet   . pregabalin (LYRICA) 100 MG capsule   . tamsulosin (FLOMAX) 0.4 MG Cap   . traMADol (ULTRAM) 50 MG tablet          OBJECTIVE:  There were no vitals taken for this visit.         Wt Readings from Last 10 Encounters:   02/28/16 100.381 kg (221 lb 4.8 oz)   02/21/16 100.562 kg (221 lb 11.2 oz)   02/14/16 100.2 kg (220 lb 14.4 oz)   02/07/16 96.979 kg (213 lb 12.8 oz)   02/01/16 99.927 kg (220 lb 4.8 oz)   01/09/16 96.163 kg (212 lb)   11/08/15 99.474 kg (219 lb 4.8 oz)     Last Weight:         CBC with Diff.  Lab Results   Component Value Date/Time    WBC 3.80 04/12/2014 12:37 PM    HGB 13.1 04/12/2014 12:37 PM    HEMATOCRIT 38.8* 04/12/2014 12:37 PM    PLATELETS 209 04/12/2014 12:37 PM    MCV 89.8 04/12/2014 12:37 PM    RDW 14 04/12/2014 12:37 PM    NEUTROPHILS 88* 07/25/2010 09:30 AM    IMMATURE GRANULOCYTE 1 07/25/2010 09:30 AM    NEUTROPHILS ABSOLUTE 15.14 07/25/2010 09:30 AM    ABSOLUTE IMMATURE GRANULOCYTE 0.16* 07/25/2010 09:30 AM       Chemistries   Lab Results   Component Value Date/Time    SODIUM 144 04/12/2014 12:37 PM    POTASSIUM 4.3 04/12/2014 12:37 PM    CHLORIDE 107 04/12/2014 12:37 PM    CO2 25 04/12/2014  12:37 PM    BUN 18.0 04/12/2014 12:37 PM    CREATININE 1.0 04/12/2014 12:37 PM    GLUCOSE 146* 04/12/2014 12:37 PM    CALCIUM 9.2 04/12/2014 12:37 PM    MAGNESIUM 1.5* 07/28/2010 09:22 AM    PHOSPHORUS 2.0* 07/26/2010 04:42 PM    BILIRUBIN, TOTAL 0.5 04/12/2014 12:37 PM    AST (SGOT) 17 04/12/2014 12:37 PM    ALT 19 04/12/2014 12:37 PM    ALKALINE PHOSPHATASE 134* 04/12/2014 12:37 PM       NAD    ASSESSMENT:  Tolerating his treatment without any significant RT-related side-effects.   Increased urinary urgency/frequency- limit night time drinking (no fluid after 10pm),advised him to limit caffeine intake (coffe, tea and soda).  Trial of flomax 0.4mg  daily (advise him to take it during daytime initially for 5 days.  If no symptoms, then start taking it at night).     If continues to have urgency/frequency- UA to rule out possible UTI    PLAN:  Continue RT as planned.      Metta Clines, MD  [facility]  [facility]

## 2016-03-07 ENCOUNTER — Encounter: Payer: Self-pay | Admitting: Radiation Oncology

## 2016-03-07 ENCOUNTER — Ambulatory Visit: Payer: Self-pay | Admitting: Radiation Oncology

## 2016-03-07 DIAGNOSIS — C61 Malignant neoplasm of prostate: Secondary | ICD-10-CM

## 2016-03-07 DIAGNOSIS — Z510 Encounter for antineoplastic radiation therapy: Secondary | ICD-10-CM | POA: Diagnosis not present

## 2016-03-07 NOTE — Progress Notes (Signed)
03/07/16 1448   Radiation Therapy Patient Care    Current Dose  26   Current Fraction Number [id number]   Planned Total Dose 7920   Planned Total Number of Fractions 44   Planned Boost  No   Comfort Alteration   Karnofsky Performance Score 90%-can perform normal activity, minor signs of disease   Fatigue 1   Pain Score 0   Elimination Alteration    Diarrhea 0-none   BMs/day  1   Rectal Bleeding 0 - None   Renal/Genitourinary   Urinary Frequency, Urgency 1-increase in frequency or nocturia up to 2 x normal   Dysuria 0-none   Daytime Frequency 2-4 hours   Vital signs   Temp 97.6 F (36.4 C)   Temp Source Temporal Art   Heart Rate 65   Resp Rate 18   BP 149/74 mmHg   Started on Flomax yesterday with good results.  Nocturia last night x 0.  Urinary frequency today every 2-4 hours.  No diarrhea or fatigue.  Pain in knee - was a 10 until taking 100 mg of Tramadol

## 2016-03-08 DIAGNOSIS — C61 Malignant neoplasm of prostate: Secondary | ICD-10-CM | POA: Diagnosis not present

## 2016-03-08 DIAGNOSIS — Z510 Encounter for antineoplastic radiation therapy: Secondary | ICD-10-CM | POA: Diagnosis not present

## 2016-03-08 NOTE — Progress Notes (Signed)
 RADIATION ONCOLOGY WEEKLY TREATMENT NOTE    Date Time:  2:50 PM 03/07/2016   Patient Name: Roy Delgado, Roy Delgado.  Diagnosis:   1. 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  He started ADT 11/14/2015.      NURSING ASSESSMENT   03/07/16 1448    Radiation Therapy Patient Care    Current Dose  26    Current Fraction Number  [id number]    Planned Total Dose  7920    Planned Total Number of Fractions  44    Planned Boost  No    Comfort Alteration    Karnofsky Performance Score  90%-can perform normal activity, minor signs of disease    Fatigue  1    Pain Score  0    Elimination Alteration    Diarrhea  0-none    BMs/day  1    Rectal Bleeding  0 - None    Renal/Genitourinary    Urinary Frequency, Urgency  1-increase in frequency or nocturia up to 2 x normal    Dysuria  0-none    Daytime Frequency  2-4 hours    Vital signs    Temp  97.6 F (36.4 C)    Temp Source  Temporal Art    Heart Rate  65    Resp Rate  18    BP  149/74 mmHg    Started on Flomax yesterday with good results. Nocturia last night x 0. Urinary frequency today every 2-4 hours. No diarrhea or fatigue. Pain in knee - was a 10 until taking 100 mg of Tramadol      PHYSICIAN ASSESSMENT    SUBJECTIVE:   Roy Delgado. is a 80 y.o. male who is seen today for on-treatment clinical management during radiation therapy.     Pertinent subjective/objective findings are documented in the above flowsheet.  Patient is doing well.  Improved urinary frequency and nocturia with flomax, now using at night.  He denies dysuria, hematuria, or diarrhea.  Ongoing right knee arthritis for which he takes tramadol and follows with his PCP.    Objective Findings:   BP 149/74 mmHg  Pulse 65  Temp(Src) 97.6 F (36.4 C) (Temporal Artery)  Resp 18  Wt 103.375 kg (227 lb 14.4 oz)  Gen:NAD, sitting comfortably in chair  Pelvis: deferred      ASSESSMENT:     Tolerating treatment with expected toxicities given dose.    Plan:  1.Continue Radiation as planned with ADT.   2. Continue monitoring urinary and bowel symptoms. Imodium as needed, pelvic diet info provided. Continue flomax.  Evaluated by covering physician.    Acie Fredrickson, MD MS  Department of Radiation Oncology   [facility]

## 2016-03-09 DIAGNOSIS — Z510 Encounter for antineoplastic radiation therapy: Secondary | ICD-10-CM | POA: Diagnosis not present

## 2016-03-09 DIAGNOSIS — C61 Malignant neoplasm of prostate: Secondary | ICD-10-CM | POA: Diagnosis not present

## 2016-03-12 DIAGNOSIS — Z510 Encounter for antineoplastic radiation therapy: Secondary | ICD-10-CM | POA: Diagnosis not present

## 2016-03-12 DIAGNOSIS — C61 Malignant neoplasm of prostate: Secondary | ICD-10-CM | POA: Diagnosis not present

## 2016-03-13 ENCOUNTER — Encounter: Payer: Self-pay | Admitting: Radiation Oncology

## 2016-03-13 ENCOUNTER — Ambulatory Visit: Payer: Self-pay | Admitting: Radiation Oncology

## 2016-03-13 DIAGNOSIS — C61 Malignant neoplasm of prostate: Secondary | ICD-10-CM

## 2016-03-13 DIAGNOSIS — Z510 Encounter for antineoplastic radiation therapy: Secondary | ICD-10-CM | POA: Diagnosis not present

## 2016-03-13 NOTE — Progress Notes (Signed)
03/13/16 1450   Radiation Therapy Patient Care    Current Dose  30   Current Fraction Number [id number]   Planned Total Dose 7920   Planned Total Number of Fractions 44   Planned Boost  No   Comfort Alteration   Karnofsky Performance Score 90%-can perform normal activity, minor signs of disease   Fatigue 2   Pain Score 2   Pain Loc KNEE   Elimination Alteration    Diarrhea 1 - Increase of < 4 stools/day over baseline pretreatment   BMs/day  2   Rectal Bleeding 0 - None   Renal/Genitourinary   Urinary Frequency, Urgency 1-increase in frequency or nocturia up to 2 x normal   Dysuria 0-none   Daytime Frequency 2-4 hours   Vital signs   Temp 98.5 F (36.9 C)   Temp Source Temporal Art   Heart Rate 67   Resp Rate 18   BP 155/75 mmHg   Mild fatigue, occasional diarrhea, no dysuria or rectal irritation.  Nocturia x 3-4.

## 2016-03-14 DIAGNOSIS — Z510 Encounter for antineoplastic radiation therapy: Secondary | ICD-10-CM | POA: Diagnosis not present

## 2016-03-14 DIAGNOSIS — C61 Malignant neoplasm of prostate: Secondary | ICD-10-CM | POA: Diagnosis not present

## 2016-03-14 MED ORDER — COLCHICINE 0.6 MG PO TABS
ORAL_TABLET | ORAL | Status: DC
Start: 2016-03-14 — End: 2024-01-17

## 2016-03-14 NOTE — Progress Notes (Signed)
[place] RADIATION ONCOLOGY WEEKLY TREATMENT NOTE    Date Time:  3:17 PM 03/13/2016   Patient Name: Roy Delgado, Roy Delgado.  Diagnosis:   1. 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  He started ADT 11/14/2015.      NURSING ASSESSMENT     03/13/16 1450    Radiation Therapy Patient Care    Current Dose  30    Current Fraction Number  [id number]    Planned Total Dose  7920    Planned Total Number of Fractions  44    Planned Boost  No    Comfort Alteration    Karnofsky Performance Score  90%-can perform normal activity, minor signs of disease    Fatigue  2    Pain Score  2    Pain Loc  KNEE    Elimination Alteration    Diarrhea  1 - Increase of < 4 stools/day over baseline pretreatment    BMs/day  2    Rectal Bleeding  0 - None    Renal/Genitourinary    Urinary Frequency, Urgency  1-increase in frequency or nocturia up to 2 x normal    Dysuria  0-none    Daytime Frequency  2-4 hours    Vital signs    Temp  98.5 F (36.9 C)    Temp Source  Temporal Art    Heart Rate  67    Resp Rate  18    BP  155/75 mmHg    Mild fatigue, occasional diarrhea, no dysuria or rectal irritation. Nocturia x 3-4.      PHYSICIAN ASSESSMENT    SUBJECTIVE:   Roy Delgado. is a 80 y.o. male who is seen today for on-treatment clinical management during radiation therapy.     Pertinent subjective/objective findings are documented in the above flowsheet.  Patient is doing well.  Ongoing urinary frequency and nocturia, stopped flomax after two days as was having hypotension and dizziness.  He denies dysuria, hematuria, or diarrhea.  Ongoing right knee arthritis for which he took colchicine he had from his wife and this resolved his pain.  He cannot get in to see his PCP for several weeks.     Objective Findings:   BP 155/75 mmHg  Pulse 67  Temp(Src) 98.5 F (36.9 C) (Temporal Artery)  Resp 18  Wt 97.387 kg (214 lb  11.2 oz)  SpO2 96%  Gen:NAD, sitting comfortably in chair  Abd: soft, NT, ND  Pelvis: deferred   R knee: mild swelling and warmth, no fluid palpable,     ASSESSMENT:     Tolerating treatment with expected toxicities given dose.    Plan:  1.Continue Radiation as planned with ADT.   2. Continue monitoring urinary and bowel symptoms. Imodium as needed, pelvic diet info provided. Has stopped flomax after 2 days due to dizziness.  Has declined trial of other meds  3. Will send Rx for colchicine to pharmacy given his history.  Will also order labs which he will get done locally.  He will then f/u with his PCP for ongoing dose adjustment and changes to his meds.    Evaluated by covering physician.    Acie Fredrickson, MD MS  Department of Radiation Oncology  [facility]

## 2016-03-15 DIAGNOSIS — Z510 Encounter for antineoplastic radiation therapy: Secondary | ICD-10-CM | POA: Diagnosis not present

## 2016-03-15 DIAGNOSIS — C61 Malignant neoplasm of prostate: Secondary | ICD-10-CM | POA: Diagnosis not present

## 2016-03-16 DIAGNOSIS — Z510 Encounter for antineoplastic radiation therapy: Secondary | ICD-10-CM | POA: Diagnosis not present

## 2016-03-16 DIAGNOSIS — C61 Malignant neoplasm of prostate: Secondary | ICD-10-CM | POA: Diagnosis not present

## 2016-03-19 DIAGNOSIS — C61 Malignant neoplasm of prostate: Secondary | ICD-10-CM | POA: Diagnosis not present

## 2016-03-19 DIAGNOSIS — Z510 Encounter for antineoplastic radiation therapy: Secondary | ICD-10-CM | POA: Diagnosis not present

## 2016-03-20 ENCOUNTER — Ambulatory Visit: Payer: TRICARE For Life (TFL) | Admitting: Internal Medicine

## 2016-03-20 ENCOUNTER — Ambulatory Visit: Payer: Self-pay | Admitting: Radiation Oncology

## 2016-03-20 ENCOUNTER — Encounter: Payer: Self-pay | Admitting: Radiation Oncology

## 2016-03-20 DIAGNOSIS — C61 Malignant neoplasm of prostate: Secondary | ICD-10-CM

## 2016-03-20 DIAGNOSIS — Z510 Encounter for antineoplastic radiation therapy: Secondary | ICD-10-CM | POA: Diagnosis not present

## 2016-03-20 NOTE — Progress Notes (Signed)
Subjective:       Patient ID: Roy Delgado. is a 80 y.o. male.    HPI    The following portions of the patient's history were reviewed and updated as appropriate: current medications, past family history, past medical history, past social history, past surgical history and problem list.    Review of Systems        Objective:    Physical Exam        Assessment:             Plan:      Procedures

## 2016-03-20 NOTE — Progress Notes (Signed)
03/20/16 1532   Radiation Therapy Patient Care    Current Dose  35   Current Fraction Number [id number]   Planned Total Dose 7920   Planned Total Number of Fractions 44   Planned Boost  No   Comfort Alteration   Karnofsky Performance Score 90%-can perform normal activity, minor signs of disease   Fatigue 2   Pain Score 0   Elimination Alteration    Diarrhea 0-none   BMs/day  2   Rectal Bleeding 0 - None   Renal/Genitourinary   Urinary Frequency, Urgency 1-increase in frequency or nocturia up to 2 x normal   Dysuria 0-none   Daytime Frequency <2 hours   Vital signs   Temp 98.5 F (36.9 C)   Temp Source Temporal Art   Heart Rate 71   Resp Rate 18   BP 121/78 mmHg   Nocturia x 0.  Not taking the Flomax - makes him dizzy.    Would rather not take the Flomax.  No dysuria.    No diarrhea.  Diarrhea this weekend when he drank coffee with the colchichine.  Fatigue better

## 2016-03-21 DIAGNOSIS — C61 Malignant neoplasm of prostate: Secondary | ICD-10-CM | POA: Diagnosis not present

## 2016-03-21 DIAGNOSIS — Z510 Encounter for antineoplastic radiation therapy: Secondary | ICD-10-CM | POA: Diagnosis not present

## 2016-03-21 NOTE — Progress Notes (Signed)
[place] RADIATION ONCOLOGY WEEKLY TREATMENT NOTE    Date Time:  11:00 PM 03/20/2016   Patient Name: Roy Delgado, Roy Delgado.  Diagnosis:   1. 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  He started ADT 11/14/2015.      NURSING ASSESSMENT     03/20/16 1532    Radiation Therapy Patient Care    Current Dose  35    Current Fraction Number  [id number]    Planned Total Dose  7920    Planned Total Number of Fractions  44    Planned Boost  No    Comfort Alteration    Karnofsky Performance Score  90%-can perform normal activity, minor signs of disease    Fatigue  2    Pain Score  0    Elimination Alteration    Diarrhea  0-none    BMs/day  2    Rectal Bleeding  0 - None    Renal/Genitourinary    Urinary Frequency, Urgency  1-increase in frequency or nocturia up to 2 x normal    Dysuria  0-none    Daytime Frequency  <2 hours    Vital signs    Temp  98.5 F (36.9 C)    Temp Source  Temporal Art    Heart Rate  71    Resp Rate  18    BP  121/78 mmHg    Nocturia x 0. Not taking the Flomax - makes him dizzy.   Would rather not take the Flomax. No dysuria.   No diarrhea. Diarrhea this weekend when he drank coffee with the colchichine.   Fatigue better      PHYSICIAN ASSESSMENT    SUBJECTIVE:   Roy Delgado. is a 80 y.o. male who is seen today for on-treatment clinical management during radiation therapy.     Pertinent subjective/objective findings are documented in the above flowsheet.  Patient is doing well.  Ongoing urinary frequency and nocturia, stopped flomax after two days as was having hypotension and dizziness.  He denies dysuria, hematuria, or diarrhea.  Ongoing right knee pain improved with colchicine.  Had diarrhea when drinking coffee - he will stop colchicine in one week and then monitor diarrhea.    Objective Findings:   BP 121/78 mmHg  Pulse 71  Temp(Src) 98.5 F (36.9 C) (Temporal  Artery)  Resp 18  Wt 96.48 kg (212 lb 11.2 oz)  Gen:NAD, sitting comfortably in chair  Abd: soft, NT, ND  Pelvis: deferred   R knee: reduced swelling and improved ROM    ASSESSMENT:     Tolerating treatment with expected toxicities given dose.    Plan:  1.Continue Radiation as planned with ADT.   2. Continue monitoring urinary and bowel symptoms. Imodium as needed, pelvic diet info provided. Has stopped flomax after 2 days due to dizziness.  Has declined trial of other meds  3. Patient to f/u with PCP regarding gout    Evaluated by covering physician.    Acie Fredrickson, MD MS  Department of Radiation Oncology  [facility]

## 2016-03-22 DIAGNOSIS — C61 Malignant neoplasm of prostate: Secondary | ICD-10-CM | POA: Diagnosis not present

## 2016-03-22 DIAGNOSIS — Z510 Encounter for antineoplastic radiation therapy: Secondary | ICD-10-CM | POA: Diagnosis not present

## 2016-03-23 DIAGNOSIS — Z510 Encounter for antineoplastic radiation therapy: Secondary | ICD-10-CM | POA: Diagnosis not present

## 2016-03-23 DIAGNOSIS — C61 Malignant neoplasm of prostate: Secondary | ICD-10-CM | POA: Diagnosis not present

## 2016-03-26 ENCOUNTER — Ambulatory Visit: Payer: No Typology Code available for payment source | Attending: Urology

## 2016-03-26 DIAGNOSIS — C61 Malignant neoplasm of prostate: Secondary | ICD-10-CM | POA: Insufficient documentation

## 2016-03-26 DIAGNOSIS — Z510 Encounter for antineoplastic radiation therapy: Secondary | ICD-10-CM | POA: Insufficient documentation

## 2016-03-27 ENCOUNTER — Ambulatory Visit: Payer: Self-pay | Admitting: Radiation Oncology

## 2016-03-27 ENCOUNTER — Encounter: Payer: Self-pay | Admitting: Radiation Oncology

## 2016-03-27 DIAGNOSIS — C61 Malignant neoplasm of prostate: Secondary | ICD-10-CM

## 2016-03-27 DIAGNOSIS — Z510 Encounter for antineoplastic radiation therapy: Secondary | ICD-10-CM | POA: Diagnosis not present

## 2016-03-27 NOTE — Progress Notes (Signed)
 RADIATION ONCOLOGY WEEKLY TREATMENT NOTE    Date Time:  4:38 PM 03/27/2016   Patient Name: Roy Delgado, Roy Delgado.  Diagnosis:   1. 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  He started ADT 11/14/2015.      NURSING ASSESSMENT   03/27/16 1432    Radiation Therapy Patient Care    Current Dose  40    Current Fraction Number  [id number]    Planned Total Dose  7920    Planned Total Number of Fractions  44    Planned Boost  No    Comfort Alteration    Karnofsky Performance Score  90%-can perform normal activity, minor signs of disease    Fatigue  2    Pain Score  0    Elimination Alteration    Diarrhea  0-none    BMs/day  2    Rectal Bleeding  0 - None    Renal/Genitourinary    Urinary Frequency, Urgency  1-increase in frequency or nocturia up to 2 x normal    Dysuria  0-none    Daytime Frequency  <2 hours    Vital signs    Temp  97.7 F (36.5 C)    Temp Source  Temporal Art    Heart Rate  74    Resp Rate  18    BP  126/80 mmHg    Doing well. Mild fatigue. No diarrhea, rectal irritation or dysuria. Nocturia x 1.      PHYSICIAN ASSESSMENT    SUBJECTIVE:   Roy Delgado. is a 80 y.o. male who is seen today for on-treatment clinical management during radiation therapy.     Pertinent subjective/objective findings are documented in the above flowsheet.  Patient is doing well.  Ongoing urinary frequency and nocturia, stopped flomax after two days as was having hypotension and dizziness.  He denies dysuria, hematuria.  Diarrhea has stopped since he stopped drinking coffee while on colchicine for his gout.  He is going to cut down on his colchicine as per instructions of his PCP.     Objective Findings:   BP 126/80 mmHg  Pulse 74  Temp(Src) 97.7 F (36.5 C) (Temporal Artery)  Resp 18  Wt 96.662 kg (213 lb 1.6 oz)  Gen:NAD, sitting comfortably in chair  Abd: soft, NT, ND  Pelvis: deferred    R knee: reduced swelling and improved ROM    ASSESSMENT:     Tolerating treatment with expected toxicities given dose.    Plan:  1.Continue Radiation as planned with ADT. Finishes next Monday.    2. Continue monitoring urinary and bowel symptoms. Imodium as needed, pelvic diet info provided. Has stopped flomax after 2 days due to dizziness.  Has declined trial of other meds  3. Patient to f/u with PCP regarding gout and colchicine med    Evaluated by covering physician.    Acie Fredrickson, MD MS  Department of Radiation Oncology  [facility]

## 2016-03-27 NOTE — Progress Notes (Signed)
03/27/16 1432   Radiation Therapy Patient Care    Current Dose  40   Current Fraction Number [id number]   Planned Total Dose 7920   Planned Total Number of Fractions 44   Planned Boost  No   Comfort Alteration   Karnofsky Performance Score 90%-can perform normal activity, minor signs of disease   Fatigue 2   Pain Score 0   Elimination Alteration    Diarrhea 0-none   BMs/day  2   Rectal Bleeding 0 - None   Renal/Genitourinary   Urinary Frequency, Urgency 1-increase in frequency or nocturia up to 2 x normal   Dysuria 0-none   Daytime Frequency <2 hours   Vital signs   Temp 97.7 F (36.5 C)   Temp Source Temporal Art   Heart Rate 74   Resp Rate 18   BP 126/80 mmHg   Doing well.  Mild fatigue.  No diarrhea, rectal irritation or dysuria.  Nocturia x 1.

## 2016-03-28 DIAGNOSIS — C61 Malignant neoplasm of prostate: Secondary | ICD-10-CM | POA: Diagnosis not present

## 2016-03-28 DIAGNOSIS — Z510 Encounter for antineoplastic radiation therapy: Secondary | ICD-10-CM | POA: Diagnosis not present

## 2016-03-29 DIAGNOSIS — C61 Malignant neoplasm of prostate: Secondary | ICD-10-CM | POA: Diagnosis not present

## 2016-03-29 DIAGNOSIS — Z510 Encounter for antineoplastic radiation therapy: Secondary | ICD-10-CM | POA: Diagnosis not present

## 2016-03-30 DIAGNOSIS — C61 Malignant neoplasm of prostate: Secondary | ICD-10-CM | POA: Diagnosis not present

## 2016-03-30 DIAGNOSIS — Z510 Encounter for antineoplastic radiation therapy: Secondary | ICD-10-CM | POA: Diagnosis not present

## 2016-04-02 ENCOUNTER — Encounter: Payer: Self-pay | Admitting: Radiation Oncology

## 2016-04-02 DIAGNOSIS — C61 Malignant neoplasm of prostate: Secondary | ICD-10-CM

## 2016-04-02 DIAGNOSIS — Z510 Encounter for antineoplastic radiation therapy: Secondary | ICD-10-CM | POA: Diagnosis not present

## 2016-04-02 NOTE — Patient Instructions (Signed)
Discharge planning  end of radiation treatment instructions  You have completed (44  ) radiation treatments to your prostate   beginning on  01/31/16  and ending on 04/02/16 . You were treated by Dr.Majithia.    If informed by your physician, you will need to call to make arrangements with our front desk staff for your follow-up appointment at [phone] or in person. Please call us if you have any questions or concerns prior to that appointment time. Please make arrangements to schedule a follow-up appointment with your referring physician as well.  A summary of your radiation treatment will be sent to your referring physician. For other physicians to receive a summary of your radiation treatment please inform our front desk staff.  skin:  Irritation or skin problems continue for several weeks. Be GENTLE with your skin in the treatment area until problems have gone. Dryness may continue and could be permanent.  1. Continue any treatment we have prescribed until your skin has clearly improved.  2. You may moisturize the treated area with your preferred lotion or cream.  3. If your skin develops wet desquamation (weeping skin) -- call for instructions.  4. Minimize sun exposure of the treated area by keeping the treated area covered and by using a SUNSCREEN protector of 15 or higher.  activity: Fatigue, feeling of weakness experienced during treatment usually resolves over 4 - 6 weeks or longer. Resume or continue your usual daily routine activities as tolerated. If you feel tired, limit activities and rest as needed.  diet: Continue eating a balanced, healthy diet. Your body is recovering from treatment and needs good nutrition. If you are losing weight without trying to do so, you need more nutrients. Add 2 or 3 cans of a nutritional supplement of your choice each day or contact Alvira Philips, our nutritionist at [phone].  pain: If you have pain, continue taking your pain medication as needed. Some pain  medications cannot be refilled without a new prescription. Pain can continue to diminish for several weeks after radiation is completed. If you are not sure about your pain medication, contact your prescribing physician. If you experience new onset pain in the treatment area, call your physician at the [facility].       medication Reconciliation: Continue taking your current medications unless instructed otherwise. For refills, it is generally best to call the prescribing physician.    additional information:    Return to see Dr. Lawerance Cruel in 4-6 weeks.

## 2016-04-06 DIAGNOSIS — C61 Malignant neoplasm of prostate: Secondary | ICD-10-CM | POA: Diagnosis not present

## 2016-04-18 ENCOUNTER — Ambulatory Visit (INDEPENDENT_AMBULATORY_CARE_PROVIDER_SITE_OTHER): Payer: Medicare Other | Admitting: Internal Medicine

## 2016-04-18 ENCOUNTER — Ambulatory Visit (INDEPENDENT_AMBULATORY_CARE_PROVIDER_SITE_OTHER)
Admission: RE | Admit: 2016-04-18 | Discharge: 2016-04-18 | Disposition: A | Discharge status: Home / Self Care | Payer: Medicare Other | Source: Ambulatory Visit | Attending: Internal Medicine | Admitting: Internal Medicine

## 2016-04-18 ENCOUNTER — Encounter: Payer: Self-pay | Admitting: Internal Medicine

## 2016-04-18 VITALS — BP 112/80 | HR 73 | Temp 97.8°F | Resp 16 | Ht 72.0 in | Wt 209.0 lb

## 2016-04-18 DIAGNOSIS — M1711 Unilateral primary osteoarthritis, right knee: Secondary | ICD-10-CM | POA: Diagnosis not present

## 2016-04-18 DIAGNOSIS — M25561 Pain in right knee: Secondary | ICD-10-CM | POA: Diagnosis not present

## 2016-04-18 DIAGNOSIS — M179 Osteoarthritis of knee, unspecified: Secondary | ICD-10-CM | POA: Diagnosis not present

## 2016-04-18 MED ORDER — TAPENTADOL HCL ER 50 MG PO TB12: 1.0000 | ORAL_TABLET | Freq: Two times a day (BID) | ORAL | Status: DC

## 2016-04-18 MED ORDER — INDOMETHACIN 25 MG PO CAPS: 25.0000 mg | ORAL_CAPSULE | Freq: Three times a day (TID) | ORAL | Status: DC | PRN

## 2016-04-18 NOTE — Progress Notes (Signed)
Pre visit review using our clinic review tool, if applicable. No additional management support is needed unless otherwise documented below in the visit note. 

## 2016-04-18 NOTE — Patient Instructions (Signed)

## 2016-04-18 NOTE — Progress Notes (Signed)
Subjective:  Patient ID: Dylan Quinn, male    DOB: November 22, 1936  Age: 80 y.o. MRN: [id number]  CC: Knee Pain   HPI Dylan Quinn presents for right knee pain. He has had arthritis in his knee for many years but for the last month the pain has worsened. He has not noticed any swelling. He thought it was gout so he has tried colchicine, tramadol, and hydrocodone with no relief from his symptoms. No other joints are bothering him.  Outpatient Prescriptions Prior to Visit  Medication Sig Dispense Refill  . brimonidine-timolol (COMBIGAN) 0.2-0.5 % ophthalmic solution Place 1 drop into both eyes 2 (two) times daily.     . clobetasol cream (TEMOVATE) [id number] % Apply 1 application topically 2 (two) times daily as needed (for rash).     . dorzolamide (TRUSOPT) 2 % ophthalmic solution Place 1 drop into both eyes 2 (two) times daily.    Marland Kitchen glucagon (GLUCAGON EMERGENCY) 1 MG injection Inject 1 mg into the muscle once as needed. (Patient taking differently: Inject 1 mg into the muscle once as needed (severe hypoglycemia). ) 2 each 1  . hydrochlorothiazide (HYDRODIURIL) 25 MG tablet Take 1 tablet (25 mg total) by mouth daily. 90 tablet 1  . insulin glargine (LANTUS) 100 UNIT/ML injection Inject 27 Units into the skin daily.    . insulin lispro (HUMALOG) 100 UNIT/ML injection Inject 7-15 Units into the skin 3 (three) times daily with meals.    . latanoprost (XALATAN) 0.005 % ophthalmic solution Place 1 drop into both eyes at bedtime.    Marland Kitchen olmesartan (BENICAR) 40 MG tablet Take 1 tablet (40 mg total) by mouth daily. 90 tablet 3  . pregabalin (LYRICA) 100 MG capsule Take 1 capsule (100 mg total) by mouth 2 (two) times daily. 180 capsule 1  . chlorpheniramine-HYDROcodone (TUSSIONEX PENNKINETIC ER) 10-8 MG/5ML SUER Take 5 mLs by mouth every 12 (twelve) hours as needed for cough. 140 mL 0   No facility-administered medications prior to visit.    ROS Review of Systems  Constitutional: Negative.  Negative  for fever, chills, diaphoresis, appetite change and fatigue.  HENT: Negative.   Eyes: Negative.  Negative for visual disturbance.  Respiratory: Negative.  Negative for cough, choking, chest tightness, shortness of breath and stridor.   Cardiovascular: Negative.  Negative for chest pain and leg swelling.  Gastrointestinal: Negative.  Negative for nausea, vomiting, abdominal pain, diarrhea and constipation.  Endocrine: Negative.   Genitourinary: Negative.  Negative for dysuria, hematuria, flank pain and difficulty urinating.  Musculoskeletal: Positive for arthralgias. Negative for myalgias, back pain and neck pain.  Skin: Negative.  Negative for color change and rash.  Allergic/Immunologic: Negative.   Neurological: Negative.  Negative for dizziness, weakness and light-headedness.  Hematological: Negative.  Negative for adenopathy. Does not bruise/bleed easily.  Psychiatric/Behavioral: Negative.     Objective:  BP 112/80 mmHg  Pulse 73  Temp(Src) 97.8 F (36.6 C) (Oral)  Resp 16  Ht 6' (1.829 m)  Wt 209 lb (94.802 kg)  BMI 28.34 kg/m2  SpO2 97%  BP Readings from Last 3 Encounters:  04/18/16 112/80  12/29/15 154/86  12/22/15 142/68    Wt Readings from Last 3 Encounters:  04/18/16 209 lb (94.802 kg)  12/29/15 221 lb (100.245 kg)  12/21/15 215 lb (97.523 kg)    Physical Exam  Constitutional: He is oriented to person, place, and time. No distress.  HENT:  Mouth/Throat: Oropharynx is clear and moist. No oropharyngeal exudate.  Eyes: Conjunctivae  are normal. Right eye exhibits no discharge. Left eye exhibits no discharge. No scleral icterus.  Neck: Normal range of motion. Neck supple. No JVD present. No tracheal deviation present. No thyromegaly present.  Cardiovascular: Normal rate, regular rhythm, normal heart sounds and intact distal pulses.  Exam reveals no gallop and no friction rub.   No murmur heard. Pulmonary/Chest: Effort normal and breath sounds normal. No stridor. No  respiratory distress. He has no wheezes. He has no rales. He exhibits no tenderness.  Abdominal: Soft. Bowel sounds are normal. He exhibits no distension and no mass. There is no tenderness. There is no rebound and no guarding.  Musculoskeletal: Normal range of motion. He exhibits no edema or tenderness.       Right knee: He exhibits deformity (DJD changes). He exhibits normal range of motion, no swelling, no effusion, no ecchymosis, no erythema, normal alignment and no bony tenderness. No tenderness found.  Lymphadenopathy:    He has no cervical adenopathy.  Neurological: He is oriented to person, place, and time.  Skin: Skin is warm and dry. No rash noted. He is not diaphoretic. No erythema. No pallor.    Lab Results  Component Value Date   WBC 7.3 12/29/2015   HGB 12.2* 12/29/2015   HCT 36.9* 12/29/2015   PLT 345.0 12/29/2015   GLUCOSE 323* 12/22/2015   CHOL 122 12/12/2015   TRIG 91.0 12/12/2015   HDL 44.50 12/12/2015   LDLCALC 59 12/12/2015   ALT 7 05/04/2013   AST 8 05/04/2013   NA 137 12/22/2015   K 3.9 12/22/2015   CL 104 12/22/2015   CREATININE 0.87 12/22/2015   BUN 12 12/22/2015   CO2 24 12/22/2015   TSH 0.53 12/12/2015   PSA 3.80 01/13/2013   HGBA1C 10.2* 12/12/2015   MICROALBUR 57.5* 12/12/2015    Dg Chest 2 View  12/21/2015  CLINICAL DATA:  Increasing shortness of breath, cough EXAM: CHEST - 2 VIEW COMPARISON:  12/12/15 FINDINGS: Cardiac shadow is within normal limits. Mild interstitial changes are noted stable from the previous exam. Minimal scarring is again noted in the left mid lung. No acute abnormality is noted. IMPRESSION: No acute abnormality noted. Electronically Signed   By: Dylan Quinn M.D.   On: 12/21/2015 15:21    Assessment & Plan:   Dylan Quinn was seen today for knee pain.  Diagnoses and all orders for this visit:  Right knee pain- plain films are positive for moderate degenerative joint disease, I don't see any evidence that this is related to  gout. -     DG Knee Complete 4 Views Right; Future -     indomethacin (INDOCIN) 25 MG capsule; Take 1 capsule (25 mg total) by mouth 3 (three) times daily as needed. -     Tapentadol HCl 50 MG TB12; Take 1 tablet by mouth 2 (two) times daily.  Primary osteoarthritis of right knee- he is not willing to receive a steroid injection in the right knee, he is not interested in surgery at this time, the plain film does show a moderate amount of degenerative joint disease, I will try to control the pain with indomethacin and Nucynta -     DG Knee Complete 4 Views Right; Future -     indomethacin (INDOCIN) 25 MG capsule; Take 1 capsule (25 mg total) by mouth 3 (three) times daily as needed. -     Tapentadol HCl 50 MG TB12; Take 1 tablet by mouth 2 (two) times daily.  I have discontinued  Mr. Suminski chlorpheniramine-HYDROcodone and traMADol. I am also having him start on indomethacin and Tapentadol HCl. Additionally, I am having him maintain his brimonidine-timolol, dorzolamide, latanoprost, clobetasol cream, glucagon, olmesartan, hydrochlorothiazide, pregabalin, insulin lispro, insulin glargine, and colchicine.  Meds ordered this encounter  Medications  . DISCONTD: traMADol (ULTRAM) 50 MG tablet    Sig: Take by mouth.  . colchicine 0.6 MG tablet    Sig: TAKE 1 TABLET BY MOUTH TWICE A DAY FOR 7 DAYS THEN 1 TAB ONCE A DAY THEREAFTER    Refill:  0  . indomethacin (INDOCIN) 25 MG capsule    Sig: Take 1 capsule (25 mg total) by mouth 3 (three) times daily as needed.    Dispense:  90 capsule    Refill:  1  . Tapentadol HCl 50 MG TB12    Sig: Take 1 tablet by mouth 2 (two) times daily.    Dispense:  60 tablet    Refill:  0     Follow-up: Return in about 4 weeks (around 05/16/2016).  Scarlette Calico, MD

## 2016-04-20 ENCOUNTER — Telehealth: Payer: Self-pay

## 2016-04-20 NOTE — Telephone Encounter (Signed)
PA initiated via CoverMyMeds key [id number]

## 2016-04-23 NOTE — Telephone Encounter (Signed)
APPROVED through 12/23/2016 

## 2016-05-02 NOTE — Progress Notes (Signed)
[facility] - RADIATION ONCOLOGY END OF TREATMENT SUMMARY    Date: 04/02/2016     Pt Name: Roy Delgado. (12-Aug-1936)    [id number]    Diagnosis:  1. Prostate cancer  C61       80 yo male with unfavorable intermediate risk prostate cancer (cT1c, PSA 8.7 ng/ml, GS 4+3, 5/12 positive cores) who received definitive external beam irradiation to the prostate and seminal vesicles in our department with 6 months of ADT under the care of Dr. Graciela Husbands.    Oncology History:  Roy Dino. is a 80 year old black man who on routine PSA screening in [place], was found to have an elevated PSA of 8 ng/mL. At the time he had no symptoms aside from urinary frequency and some decreased energy. He was referred to urology for further evaluation and workup. PSA on February 09, 2015 was 8.3. PSA, July 08, 2015 was 8.7. He was seen by Dr. Nonda Lou of urology and underwent transrectal ultrasound-guided biopsy of the prostate September 13, 2015, which demonstrated prostate adenocarcinoma in multiple cores. On the left, there was Gleason 3+4 at the left base, 26%; left apex, Gleason 3+4, 40%; left lateral apex, Gleason 4+3, 70%. The remaining 3 left cores were benign. On the right, there was Gleason 3+4, 5% at the right lateral base; and Gleason 3+3, 5% involving the right lateral mid gland. The remaining 4 right cores were benign.  Following his diagnosis, Roy Delgado discussed potential treatment options with Dr. Graciela Husbands. Surgery was not felt to be ideal given Roy Delgado advanced age and medical comorbidities. Accordingly, he was referred to radiation oncology for discussion of further options and saw Dr. Leandra Kern. On initial consultation, his UPSS score was 16 and SHIM score was 1.  Thus he proceeded with definitive EBRT with 6 months of ADT under the care of Dr. Graciela Husbands.  He started ADT on 11/14/15.      He completed a course of definitive  external beam radiotherapy in our department.    Treatment  Site Fields Energy Ref Pt/   IDL Daily  Dose  Fx Total   Dose  Treatment Dates Elapsed Days   Prostate and SV 2 arc VMAT 10 MV ProstateSV79.2/  100% 180 cGy 44 180 cGy 02/01/16-  04/02/16 63           Intent: curative  Concurrent Therapy: ADT  IGRT: daily CBCT    Radiation Tolerance:  Roy Posey. developed expected symptoms from radiotherapy including fatigue, urinary frequency (did not tolerate flomax due to hypotension).  He developed a gout flare in his right knee that resolved with colchicine. He did have few episodes of diarrhea while on colchicine and EBRT.  He modified his diet and the diarrhea resolved. There were no unplanned treatment breaks.     Follow-up:  Roy Stefanelli. completed his therapy in our clinic in stable condition. He will return for routine follow-up in 4-6 weeks. He will continue to follow closely with urology.     Thank you for allowing me to participate in the management and care of this patient. If I may answer any questions in the interim, please do not hesitate to contact me at any time.     Acie Fredrickson, MD MS  Dept of Radiation Oncology  [facility]

## 2016-05-11 DIAGNOSIS — E1040 Type 1 diabetes mellitus with diabetic neuropathy, unspecified: Secondary | ICD-10-CM | POA: Diagnosis not present

## 2016-05-17 DIAGNOSIS — Z7689 Persons encountering health services in other specified circumstances: Secondary | ICD-10-CM | POA: Diagnosis not present

## 2016-05-17 DIAGNOSIS — M1711 Unilateral primary osteoarthritis, right knee: Secondary | ICD-10-CM | POA: Diagnosis not present

## 2016-05-17 DIAGNOSIS — C61 Malignant neoplasm of prostate: Secondary | ICD-10-CM | POA: Diagnosis not present

## 2016-05-17 DIAGNOSIS — E1140 Type 2 diabetes mellitus with diabetic neuropathy, unspecified: Secondary | ICD-10-CM | POA: Diagnosis not present

## 2016-05-17 DIAGNOSIS — H401132 Primary open-angle glaucoma, bilateral, moderate stage: Secondary | ICD-10-CM | POA: Insufficient documentation

## 2016-05-17 DIAGNOSIS — I10 Essential (primary) hypertension: Secondary | ICD-10-CM | POA: Diagnosis not present

## 2016-05-22 ENCOUNTER — Telehealth: Payer: Self-pay | Admitting: Internal Medicine

## 2016-05-22 MED ORDER — INSULIN GLARGINE 100 UNIT/ML ~~LOC~~ SOLN: 27.0000 [IU] | Freq: Every day | SUBCUTANEOUS | Status: DC

## 2016-05-22 NOTE — Telephone Encounter (Signed)
PT needs Lantus called into [employer]

## 2016-06-04 DIAGNOSIS — M1711 Unilateral primary osteoarthritis, right knee: Secondary | ICD-10-CM | POA: Diagnosis not present

## 2016-06-05 DIAGNOSIS — M1711 Unilateral primary osteoarthritis, right knee: Secondary | ICD-10-CM | POA: Diagnosis not present

## 2016-06-06 DIAGNOSIS — M1711 Unilateral primary osteoarthritis, right knee: Secondary | ICD-10-CM | POA: Diagnosis not present

## 2016-06-12 DIAGNOSIS — M25561 Pain in right knee: Secondary | ICD-10-CM | POA: Diagnosis not present

## 2016-06-13 ENCOUNTER — Ambulatory Visit (INDEPENDENT_AMBULATORY_CARE_PROVIDER_SITE_OTHER): Payer: Medicare Other | Admitting: Internal Medicine

## 2016-06-13 ENCOUNTER — Encounter: Payer: Self-pay | Admitting: Internal Medicine

## 2016-06-13 DIAGNOSIS — E1021 Type 1 diabetes mellitus with diabetic nephropathy: Secondary | ICD-10-CM | POA: Diagnosis not present

## 2016-06-13 LAB — POCT GLYCOSYLATED HEMOGLOBIN (HGB A1C): HEMOGLOBIN A1C: 11.6

## 2016-06-13 MED ORDER — INSULIN GLARGINE 100 UNIT/ML ~~LOC~~ SOLN: 22.0000 [IU] | Freq: Every day | SUBCUTANEOUS | Status: DC

## 2016-06-13 NOTE — Progress Notes (Signed)
Patient ID: ATA ABBITT, male   DOB: 02/15/1936, 80 y.o.   MRN: [id number]  HPI: Dylan Quinn is a 80 y.o.-year-old male, returning for f/u of DM2, dx 2007, insulin-dependent, uncontrolled, with complications (severe hypoglycemia, widely fluctuating sugars, admission for DKA, diabetic retinopathy, CKD, peripheral neuropathy, mild proteinuria). Last visit 6 months ago.  New PCP: Dr. Ginette Pitman [facility]  Patient and his wife (also a patient of mine) live in [place] ([place] She accompanies him today and offers part of the hx.  He was on insulin pump from 2005-2009. Does not want to go back to one.  He was on a Paleo diet before >> sugars much better >> but fell off the wagon 06/2015.   He was admitted with PNA 12/21/2015.  He went through PrCa treatment >> loss of appetite.   He also has B knee pain. He got a steroid inj yesterday and 3 weeks ago.   Last hemoglobin A1c was: Lab Results  Component Value Date   HGBA1C 10.2* 12/12/2015   HGBA1C 10.3 08/05/2015   HGBA1C 11.4* 03/31/2015   Patient has a history of DKA, and also severe hypoglycemia, lowest CBG was 18 at night, not recently.   He is on the following insulin regimen: Lantus 27 units in am (May miss when sugars are lower) Humalog: NOT TAKING!  Continue Humalog sliding scale:  - 150-175: + 1 unit  - 176-200: + 2 units  - 201-225: + 3 units  - 226-250: + 4 units  - >250: + 5 units Do not inject the sliding scale at bedtime, unless >300, and then inject half of the above doses.   Pt checks his sugars 2-3x a day and they are (brings sugar log) - no lows except 1x in the 51. Sugars still terrible.  "My sugars can be better if I stopped the [employer] sweet tea and the midnight snacks (a bowl of cereals, cake, chips)"   He tells me: - am: 66 x1, 154-279, 300s >> 147-347 >> 56, 84, 119, 159, 163-313, 347 >> 51, 146-391 >> 99-336 - lunch: 186-379 >> 196-439 >> 98-349 >> 70, 189-328 - before dinner: 321-495 >>  78, 128-440 >> 229-391 - bedtime: 169, 300's-400s >> 51, 193-402 (takes 5-10 units at bedtime) >> 120-502 >> n/c >> 316-HI No more severe lows. Lowest sugar was 18 (see above) but not recently >> 1x51 >> 56x1;  he has hypoglycemia awareness at 80.  Pt 's last BUN/creatinine was:  Lab Results  Component Value Date   BUN 12 12/22/2015   CREATININE 0.87 12/22/2015  He is on Lisinopril. He is seeing nephrology. Last set of lipids: Lab Results  Component Value Date   CHOL 122 12/12/2015   HDL 44.50 12/12/2015   LDLCALC 59 12/12/2015   TRIG 91.0 12/12/2015   CHOLHDL 3 12/12/2015   Pt's last eye exam was in 12/2015. + DR - had Laser Sx Denies numbness and tingling in his legs.  PMH: Patient also has a history of anemia, hypogonadism, glaucoma, hypoacusis, BPH, hypertension, osteoporosis, lumbar radiculopathy, history of urinary calculus, psoriasis.  ROS: Constitutional: no weight gain,+ fatigue, + hot flushes Eyes: no blurry vision, no xerophthalmia ENT: no sore throat, no nodules palpated in throat, no dysphagia/odynophagia, no hoarseness, + decreased hearing Cardiovascular: no CP/SOB/palpitations/+ leg swelling Respiratory: no cough/SOB Gastrointestinal: no N/V/D/C Musculoskeletal: no muscle/+ joint aches(Knees) Skin: no rash Neurological: no tremors/numbness/tingling/dizziness, no HA  I reviewed pt's medications, allergies, PMH, social hx, family hx, and changes were  documented in the history of present illness. Otherwise, unchanged from my initial visit note.  PE: BP 132/82 mmHg  Pulse 72  Ht 5\' 11"  (1.803 m)  Wt 214 lb 12.8 oz (97.433 kg)  BMI 29.97 kg/m2  SpO2 96% Body mass index is 29.97 kg/(m^2).  Wt Readings from Last 3 Encounters:  06/13/16 214 lb 12.8 oz (97.433 kg)  04/18/16 209 lb (94.802 kg)  12/29/15 221 lb (100.245 kg)   Constitutional: normal weight, in NAD Eyes: PERRLA, EOMI, no exophthalmos ENT: moist mucous membranes, no thyromegaly, no cervical  lymphadenopathy Cardiovascular: RRR, No MRG Respiratory: CTA B Gastrointestinal: abdomen soft, NT, ND, BS+ Musculoskeletal: no deformities, strength intact in all 4 Skin: moist, warm, faint rash on left forearm Neurological: no tremor with outstretched hands, DTR normal in all 4  ASSESSMENT: 1. LADA, insulin-dependent, uncontrolled, with complications - diabetic retinopathy - CKD, mild proteinuria - peripheral neuropathy-on Lyrica  PLAN:  1. Very complex patient, with brittle DM - most likely LADA (based on his wide sugar fluctuations, his body habitus, increase sensitivity to insulin, development of DKA if not covered with insulin for 24 hours).   Barriers to progress: Noncompliance with insulin boluses Erratic diet, including many sweets, snacks and full meals at night  >> none covered with insulin H/o severe hypoglycemia  -  We again discussed with him and wife that we cannot get good control of his sugars until he improves his diet and bolusing. I also suggested an insulin pump. His sugar in the office was 423 >> cannot check a C peptide today. He agrees to try the pump again. He will need carb counting class also. - The patient tells me that his sugars did not improve at all since he is seeing me. However, he repeatedly told me in the past that his sugars would be controlled if it weren't for his dietary indiscretions (he can see down and eat a whole box of doughnuts, for example). Also, upon questioning, he is not taking the Lantus when his sugars are lower in the morning and also he reveals to me today that he is not taking his mealtime insulin at all. He is only taking sliding scale insulin. Moreover, he complains of low sugars in the morning, however, he is not following my advice to not bolus at bedtime unless sugars are higher than 300 and then only inject half of the above doses. I discussed with him about the above and he agrees that his implication in his diabetes management is  paramount, and he needs to start following instructions... - For now, will add lower doses of mealtime insulin and will decrease the Lantus: Patient Instructions  Please decrease: - Lantus to 22 units every morning  Start mealtime insulin: - Humalog: Smaller meal: 5 units Regular meal: 7 units Larger meal or if you have desert: 9 units  Continue Humalog sliding scale:  - 150-175: + 1 unit  - 176-200: + 2 units  - 201-225: + 3 units  - 226-250: + 4 units  - >250: + 5 units  Do not inject the sliding scale at bedtime, unless >300, and then inject half of the above doses.   Schedule another appt with Vaughan Basta to start the pump process.  - up to date with annual eye exams  - will check a hemoglobin A1c today >> 11.6% (higher) - he tells me now he would like to know more about a pump >> discussed briefly about them >> again referred to [facility]  edu - will see him back in 2 mo  - time spent with the patient: 40 min, of which >50% was spent in reviewing his sugar log, insulin doses, diet, discussing his hypo- and hyper-glycemic episodes, reviewing previous labsand developing a plan to avoid hypo- and hyper-glycemia.

## 2016-06-13 NOTE — Patient Instructions (Signed)
Please decrease: - Lantus to 22 units every morning  Start mealtime insulin: - Humalog: Smaller meal: 5 units Regular meal: 7 units Larger meal or if you have desert: 9 units  Continue Humalog sliding scale:  - 150-175: + 1 unit  - 176-200: + 2 units  - 201-225: + 3 units  - 226-250: + 4 units  - >250: + 5 units  Do not inject the sliding scale at bedtime, unless >300, and then inject half of the above doses.   Schedule another appt with Vaughan Basta to start the pump process.

## 2016-06-25 DIAGNOSIS — H401113 Primary open-angle glaucoma, right eye, severe stage: Secondary | ICD-10-CM | POA: Diagnosis not present

## 2016-06-25 DIAGNOSIS — E083212 Diabetes mellitus due to underlying condition with mild nonproliferative diabetic retinopathy with macular edema, left eye: Secondary | ICD-10-CM | POA: Diagnosis not present

## 2016-06-25 DIAGNOSIS — E113291 Type 2 diabetes mellitus with mild nonproliferative diabetic retinopathy without macular edema, right eye: Secondary | ICD-10-CM | POA: Diagnosis not present

## 2016-06-25 DIAGNOSIS — H34831 Tributary (branch) retinal vein occlusion, right eye, with macular edema: Secondary | ICD-10-CM | POA: Diagnosis not present

## 2016-06-28 DIAGNOSIS — M25561 Pain in right knee: Secondary | ICD-10-CM | POA: Diagnosis not present

## 2016-07-03 DIAGNOSIS — C61 Malignant neoplasm of prostate: Secondary | ICD-10-CM | POA: Diagnosis not present

## 2016-07-10 DIAGNOSIS — C61 Malignant neoplasm of prostate: Secondary | ICD-10-CM | POA: Diagnosis not present

## 2016-07-11 ENCOUNTER — Ambulatory Visit: Payer: No Typology Code available for payment source | Attending: Urology

## 2016-07-11 ENCOUNTER — Ambulatory Visit: Payer: Self-pay | Admitting: Radiation Oncology

## 2016-07-11 ENCOUNTER — Encounter: Payer: Self-pay | Admitting: Radiation Oncology

## 2016-07-11 DIAGNOSIS — C61 Malignant neoplasm of prostate: Secondary | ICD-10-CM | POA: Insufficient documentation

## 2016-07-11 DIAGNOSIS — Z08 Encounter for follow-up examination after completed treatment for malignant neoplasm: Secondary | ICD-10-CM | POA: Diagnosis not present

## 2016-07-11 NOTE — Progress Notes (Signed)
07/11/16 1334   Comfort Alteration   Karnofsky Performance Score 80%-can perform normal activity with effort, some signs of disease   Fatigue 2   Pain Score 3   Pain Loc KNEE   Elimination Alteration    Diarrhea 0-none   BMs/day  1   Rectal Bleeding 0 - None   Renal/Genitourinary   Urinary Frequency, Urgency 1-increase in frequency or nocturia up to 2 x normal   Dysuria 0-none   Daytime Frequency <2 hours   Vital signs   Temp 98.5 F (36.9 C)   Temp Source Temporal Art   Heart Rate 62   Resp Rate 18   Nocturia - hourly starting from about 5 am.   See IPSS and SHIM.  Urination about the same since finishing radiation.  No diarrhea.Saw urologist yesterday.  PSA on 07/03/2016 is <0.1

## 2016-07-19 ENCOUNTER — Ambulatory Visit: Payer: Medicare Other | Admitting: Internal Medicine

## 2016-07-19 DIAGNOSIS — E1142 Type 2 diabetes mellitus with diabetic polyneuropathy: Secondary | ICD-10-CM | POA: Diagnosis not present

## 2016-07-19 DIAGNOSIS — E1165 Type 2 diabetes mellitus with hyperglycemia: Secondary | ICD-10-CM | POA: Diagnosis not present

## 2016-07-19 DIAGNOSIS — B351 Tinea unguium: Secondary | ICD-10-CM | POA: Diagnosis not present

## 2016-07-31 ENCOUNTER — Ambulatory Visit: Payer: Medicare Other | Admitting: Nutrition

## 2016-08-01 NOTE — Progress Notes (Signed)
RADIATION ONCOLOGY FOLLOW-UP NOTE    Date of Service: 07/11/2016    Referring Physician: Dr. Nonda Lou    HISTORY OF PRESENT ILLNESS:   CC: Prostate Cancer (C61)    Roy Delgado. was seen today for a routine follow-up visit.  As you recall, he is a 80 y.o. 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      Oncologic History:   Tijuan Dantes. is a 80 year old black man who on routine PSA screening in [place], was found to have an elevated PSA of 8 ng/mL. At the time he had no symptoms aside from urinary frequency and some decreased energy. He was referred to urology for further evaluation and workup. PSA on February 09, 2015 was 8.3. PSA, July 08, 2015 was 8.7. He was seen by Dr. Nonda Lou of urology and underwent transrectal ultrasound-guided biopsy of the prostate September 13, 2015, which demonstrated prostate adenocarcinoma in multiple cores. On the left, there was Gleason 3+4 at the left base, 26%; left apex, Gleason 3+4, 40%; left lateral apex, Gleason 4+3, 70%. The remaining 3 left cores were benign. On the right, there was Gleason 3+4, 5% at the right lateral base; and Gleason 3+3, 5% involving the right lateral mid gland. The remaining 4 right cores were benign.  Following his diagnosis, Mr. Decarlo discussed potential treatment options with Dr. Graciela Husbands. Surgery was not felt to be ideal given Mr. Tomasello advanced age and medical comorbidities. Accordingly, he was referred to radiation oncology for discussion of further options and saw Dr. Leandra Kern. On initial consultation, his IPSS score was 16 and SHIM score was 1. Thus he proceeded with definitive EBRT with 6 months of ADT under the care of Dr. Graciela Husbands. He started ADT on 11/14/15. He received 79.2 Gy in 44 fractions form 02/01/16-04/02/16 to the prostate and proximal seminal vesicles.      He presents for 3 month  follow-up feeling well.  He has spent much of the past 3 months back in [place] but continues to follow with physicians in [place].  He notes ongoing urinary frequency and nocturia X 3-4 times per night.  He was trialed on flomax for several days during radiotherapy but had severe dizziness and the medication was stopped.  He has declined similar medications.  He is diabetic and his sugars have not been as well controlled recently thus part of his urinary symptoms may be related to this.  His SHIM score is currently 5 and IPSS is 12 (compared to pre-tx was 16).  He denies diarrhea, pain with defecation, or blood in his stool.  He saw Dr. Graciela Husbands yesterday.  His last PSA check was 07/03/16 and was <0.1 ng/ml.  He denies SOB, cough, wheezing, chest pain, palpitations, abdominal pain, N/V, headaches, vision changes, seizures, numbness/tingling, focal weakness, ataxia, falls, or bony pain. No other complaints.    I have reviewed Laverle Patter Jr.'s medical, surgical and other pertinent history in detail, and have updated medication and allergy information in the electronic medical record.    ROS: Pertinent ROS as noted above. Otherwise negative or non-contributory.     PHYSICAL EXAM:   Pulse 62  Temp(Src) 98.5 F (36.9 C) (Temporal Artery)  Resp 18  Wt 97.07 kg (214 lb)  ECOG: 0  CONSTITUTIONAL: This is a well developed, well-nourished male in NAD, appears stated age.   HEENT: NC/AT, PERRL, EOMI,  sclera anicteric. MMM, no oral lesions.   NECK: Supple, with no thyromegaly, and non-tender. No tracheal deviation. No cervical or SCL LAD  CARDIAC: Regular rate and rhythm. Normal S1, S2. No murmurs, rubs or gallops.   PULMONARY: Lungs are CTAB without w/r/r.    ABDOMINAL: soft, NT, ND. No HSM. Normoactive bs throughout. No guarding, rebound.   RECTAL: normal anal opening, no hemorrhoids, normal rectal tone.  Prostate soft without nodularity or asymmetry  BACK: Straight & aligned, no CVA tenderness. Axial skeleton  non-tender to percussion.   EXTREMITIES: Full ROM in all extremities, particularly b/l UE. No c/c/e. No lymphedema  SKIN: Skin is warm and dry, not diaphoretic. No ulcerations or erosions.   NEUROLOGIC EXAM: A&OX3. CN II-XII intact. No focal deficits. No UE/LE sensory or motor changes. Gait and posture wnl.    PSYCHIATRIC: Mood and affect appropriate for clinical situation.       LABS:   Lab Results   Component Value Date    WBC 3.80 04/12/2014    HGB 13.1 04/12/2014    HCT 38.8* 04/12/2014    MCV 89.8 04/12/2014    PLT 209 04/12/2014     Lab Results   Component Value Date    NA 144 04/12/2014    K 4.3 04/12/2014    CL 107 04/12/2014    CO2 25 04/12/2014       RADIOLOGY: no new imaging      ASSESSMENT AND PLAN:   80 y.o. 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      He is recovering well from the acute side effects of radiotherapy with residual urinary frequency and nocturia.  He had previously not tolerated flomax medication and thus will continue to monitor symptoms as he declined other medication trials.  He has no evidence of recurrent disease and PSA remains low.  He will continue to follow with urology and next PSA testing is already scheduled.  I will see him back in 6 months for a routine follow-up.  He will continue to follow closely with his PCP for diabetes management.  He was provided contact information should he have any questions in the interim.    Thank you for allowing me to participate in the care of this patient.     25 minutes of 30 minutes were spent in direct patient counseling and coordination of care.     Acie Fredrickson, MD MS  Radiation Oncology  [facility]

## 2016-08-14 ENCOUNTER — Ambulatory Visit: Payer: Medicare Other | Admitting: Internal Medicine

## 2016-08-31 DIAGNOSIS — E1142 Type 2 diabetes mellitus with diabetic polyneuropathy: Secondary | ICD-10-CM | POA: Diagnosis not present

## 2016-10-02 DIAGNOSIS — M170 Bilateral primary osteoarthritis of knee: Secondary | ICD-10-CM | POA: Diagnosis not present

## 2016-10-09 ENCOUNTER — Ambulatory Visit (INDEPENDENT_AMBULATORY_CARE_PROVIDER_SITE_OTHER): Payer: Medicare Other | Admitting: Internal Medicine

## 2016-10-09 ENCOUNTER — Encounter: Payer: Self-pay | Admitting: Internal Medicine

## 2016-10-09 ENCOUNTER — Ambulatory Visit: Payer: Medicare Other | Admitting: Nutrition

## 2016-10-09 VITALS — BP 128/80 | HR 78 | Ht 70.5 in | Wt 213.0 lb

## 2016-10-09 DIAGNOSIS — E1042 Type 1 diabetes mellitus with diabetic polyneuropathy: Secondary | ICD-10-CM

## 2016-10-09 DIAGNOSIS — Z23 Encounter for immunization: Secondary | ICD-10-CM

## 2016-10-09 LAB — POCT GLYCOSYLATED HEMOGLOBIN (HGB A1C): Hemoglobin A1C: 10.4

## 2016-10-09 NOTE — Progress Notes (Signed)
Patient ID: Dylan Quinn, male   DOB: [birth year], 80 y.o.   MRN: [id number]  HPI: Dylan Quinn is a 80 y.o.-year-old male, returning for f/u of DM2, dx 2007, insulin-dependent, uncontrolled, with complications (severe hypoglycemia, widely fluctuating sugars, admission for DKA, diabetic retinopathy, CKD, peripheral neuropathy, mild proteinuria). Last visit 4 months ago.  New PCP: Dr. Ginette Pitman [facility]  Patient and his wife (also a patient of mine) live in [place] ([place] She accompanies him today and offers part of the hx.  He was on insulin pump from 2005-2009. He would want to go back to a pump.  He can barely walk b/c knee pain. On Tramadol. His last steroid inj was in June.   He is still on hormonal tx for PrCa. He has hot flushes, fatigue.  Last hemoglobin A1c was: Lab Results  Component Value Date   HGBA1C 11.6 06/13/2016   HGBA1C 10.2 (H) 12/12/2015   HGBA1C 10.3 08/05/2015   Patient has a history of DKA, and also severe hypoglycemia, lowest CBG was 18 at night, not recently.   He is on the following insulin regimen: Lantus 27 >> 22 units in am Humalog: Smaller meal: 5 units Regular meal: 7 units Larger meal or if you have dessert: 9 unitsContinue Humalog sliding scale:  - 150-175: + 1 unit  - 176-200: + 2 units  - 201-225: + 3 units  - 226-250: + 4 units  - >250: + 5 units Do not inject the sliding scale at bedtime, unless >300, and then inject half of the above doses.   Pt checks his sugars 2-3x a day and they are (brings sugar log) - few lows in the 60-70s.  "My sugars can be better if I stopped the [employer] sweet tea and the midnight snacks (a bowl of cereals, cake, chips)"   He checks sugars 3-4 times a day-very variable: - am: 66 x1, 154-279, 300s >> 147-347 >> 56, 84, 119, 159, 163-313, 347 >> 51, 146-391 >> 99-336 >> 129-191, 361 - lunch: 186-379 >> 196-439 >> 98-349 >> 70, 189-328 >> 61-236, 349, 416 - before dinner: 321-495 >> 78, 128-440  >> 229-391 >> 137-421 - bedtime: 169, 300's-400s >> 51, 193-402 (takes 5-10 units at bedtime) >> 120-502 >> n/c >> 316-HI >> 178-452 No more severe lows. Lowest sugar was 18 but not recently >> 1x51 >> 56x1 >> 56x1;  he has hypoglycemia awareness at 75.  Pt 's last BUN/creatinine was:  Lab Results  Component Value Date   BUN 12 12/22/2015   CREATININE 0.87 12/22/2015  He is on Lisinopril. He is seeing nephrology. Last set of lipids: Lab Results  Component Value Date   CHOL 122 12/12/2015   HDL 44.50 12/12/2015   LDLCALC 59 12/12/2015   TRIG 91.0 12/12/2015   CHOLHDL 3 12/12/2015   Pt's last eye exam was in 12/2015. + DR - had Laser Sx Denies numbness and tingling in his legs.  PMH: Patient also has a history of anemia, hypogonadism, glaucoma, hypoacusis, BPH, hypertension, osteoporosis, lumbar radiculopathy, history of urinary calculus, psoriasis.  ROS: Constitutional: no weight gain,+ fatigue, + hot flushes Eyes: no blurry vision, no xerophthalmia ENT: no sore throat, no nodules palpated in throat, no dysphagia/odynophagia, no hoarseness, + decreased hearing Cardiovascular: no CP/SOB/palpitations/leg swelling Respiratory: no cough/SOB Gastrointestinal: no N/V/D/C Musculoskeletal: no muscle/+ joint aches(Knees) Skin: no rash Neurological: no tremors/numbness/tingling/dizziness, no HA  I reviewed pt's medications, allergies, PMH, social hx, family hx, and changes were documented in the  history of present illness. Otherwise, unchanged from my initial visit note.  PE: BP 128/80 (BP Location: Left Arm, Patient Position: Sitting)   Pulse 78   Ht 5' 10.5" (1.791 m)   Wt 213 lb (96.6 kg)   SpO2 99%   BMI 30.13 kg/m  Body mass index is 30.13 kg/m.  Wt Readings from Last 3 Encounters:  10/09/16 213 lb (96.6 kg)  06/13/16 214 lb 12.8 oz (97.4 kg)  04/18/16 209 lb (94.8 kg)   Constitutional: normal weight, in NAD Eyes: PERRLA, EOMI, no exophthalmos ENT: moist mucous  membranes, no thyromegaly, no cervical lymphadenopathy Cardiovascular: RRR, No MRG Respiratory: CTA B Gastrointestinal: abdomen soft, NT, ND, BS+ Musculoskeletal: no deformities, strength intact in all 4 Skin: moist, warm Neurological: no tremor with outstretched hands, DTR normal in all 4  ASSESSMENT: 1. LADA, insulin-dependent, uncontrolled, with complications - diabetic retinopathy - CKD, mild proteinuria - peripheral neuropathy-on Lyrica  PLAN:  1. Very complex patient, with brittle DM - most likely LADA (based on his wide sugar fluctuations, his body habitus, increase sensitivity to insulin, development of DKA if not covered with insulin for 24 hours).   Barriers to progress: Noncompliance with insulin boluses Erratic diet, including many sweets, snacks and full meals at night H/o severe hypoglycemia  -  We again discussed with him and wife that about starting an insulin pump. He agrees to try the pump again. He will need carb counting class also. Will refer schedule an appointment with Leonia Reader to see if he can get the pumpa  and then will also need an appointment with Antonieta Iba, nutritionist for a carb counting. - For now, since there are no patterns in his CBG log will continue the current regimen : Patient Instructions  Please continue: - Lantus 22 units every morning - Humalog: Smaller meal: 5 units Regular meal: 7 units Larger meal or if you have dessert: 9 units - Humalog sliding scale:  - 150-175: + 1 unit  - 176-200: + 2 units  - 201-225: + 3 units  - 226-250: + 4 units  - >250: + 5 units  Do not inject the sliding scale at bedtime, unless >300, and then inject half of the above doses.   Schedule another appt with Vaughan Basta to start the pump process.  - up to date with annual eye exams  - will check a hemoglobin A1c today >> 10.4% (slightly better) - will see him back in 3 mo  Philemon Kingdom, MD PhD [facility]

## 2016-10-09 NOTE — Patient Instructions (Signed)
Please continue: - Lantus 22 units every morning - Humalog: Smaller meal: 5 units Regular meal: 7 units Larger meal or if you have desert: 9 units - Humalog sliding scale:  - 150-175: + 1 unit  - 176-200: + 2 units  - 201-225: + 3 units  - 226-250: + 4 units  - >250: + 5 units  Do not inject the sliding scale at bedtime, unless >300, and then inject half of the above doses.   Schedule another appt with Vaughan Basta to start the pump process.

## 2016-10-11 DIAGNOSIS — Z794 Long term (current) use of insulin: Secondary | ICD-10-CM | POA: Diagnosis not present

## 2016-10-11 DIAGNOSIS — E1140 Type 2 diabetes mellitus with diabetic neuropathy, unspecified: Secondary | ICD-10-CM | POA: Diagnosis not present

## 2016-10-11 DIAGNOSIS — Z0000 Encounter for general adult medical examination without abnormal findings: Secondary | ICD-10-CM | POA: Diagnosis not present

## 2016-10-11 DIAGNOSIS — Z125 Encounter for screening for malignant neoplasm of prostate: Secondary | ICD-10-CM | POA: Diagnosis not present

## 2016-10-11 DIAGNOSIS — I10 Essential (primary) hypertension: Secondary | ICD-10-CM | POA: Diagnosis not present

## 2016-10-18 DIAGNOSIS — M25561 Pain in right knee: Secondary | ICD-10-CM | POA: Diagnosis not present

## 2016-10-18 DIAGNOSIS — M170 Bilateral primary osteoarthritis of knee: Secondary | ICD-10-CM | POA: Diagnosis not present

## 2016-10-18 DIAGNOSIS — M25562 Pain in left knee: Secondary | ICD-10-CM | POA: Diagnosis not present

## 2016-10-22 DIAGNOSIS — Z23 Encounter for immunization: Secondary | ICD-10-CM | POA: Diagnosis not present

## 2016-10-22 DIAGNOSIS — I10 Essential (primary) hypertension: Secondary | ICD-10-CM | POA: Diagnosis not present

## 2016-10-22 DIAGNOSIS — C61 Malignant neoplasm of prostate: Secondary | ICD-10-CM | POA: Diagnosis not present

## 2016-10-22 DIAGNOSIS — Z0000 Encounter for general adult medical examination without abnormal findings: Secondary | ICD-10-CM | POA: Diagnosis not present

## 2016-10-22 DIAGNOSIS — M1711 Unilateral primary osteoarthritis, right knee: Secondary | ICD-10-CM | POA: Diagnosis not present

## 2016-10-22 DIAGNOSIS — E1140 Type 2 diabetes mellitus with diabetic neuropathy, unspecified: Secondary | ICD-10-CM | POA: Diagnosis not present

## 2016-10-23 DIAGNOSIS — M25561 Pain in right knee: Secondary | ICD-10-CM | POA: Diagnosis not present

## 2016-10-24 ENCOUNTER — Encounter: Payer: Medicare Other | Admitting: Nutrition

## 2016-10-24 DIAGNOSIS — M25561 Pain in right knee: Secondary | ICD-10-CM | POA: Diagnosis not present

## 2016-10-29 DIAGNOSIS — M170 Bilateral primary osteoarthritis of knee: Secondary | ICD-10-CM | POA: Diagnosis not present

## 2016-10-29 DIAGNOSIS — M25562 Pain in left knee: Secondary | ICD-10-CM | POA: Diagnosis not present

## 2016-10-29 DIAGNOSIS — M25561 Pain in right knee: Secondary | ICD-10-CM | POA: Diagnosis not present

## 2016-10-29 DIAGNOSIS — R262 Difficulty in walking, not elsewhere classified: Secondary | ICD-10-CM | POA: Diagnosis not present

## 2016-10-30 DIAGNOSIS — M25562 Pain in left knee: Secondary | ICD-10-CM | POA: Diagnosis not present

## 2016-11-01 DIAGNOSIS — M25561 Pain in right knee: Secondary | ICD-10-CM | POA: Diagnosis not present

## 2016-11-01 DIAGNOSIS — R110 Nausea: Secondary | ICD-10-CM | POA: Diagnosis not present

## 2016-11-01 DIAGNOSIS — E1140 Type 2 diabetes mellitus with diabetic neuropathy, unspecified: Secondary | ICD-10-CM | POA: Diagnosis not present

## 2016-11-01 DIAGNOSIS — I10 Essential (primary) hypertension: Secondary | ICD-10-CM | POA: Diagnosis not present

## 2016-11-01 DIAGNOSIS — M1711 Unilateral primary osteoarthritis, right knee: Secondary | ICD-10-CM | POA: Diagnosis not present

## 2016-11-14 ENCOUNTER — Telehealth: Payer: Self-pay | Admitting: Internal Medicine

## 2016-11-14 MED ORDER — INSULIN GLARGINE 100 UNIT/ML ~~LOC~~ SOLN: 22.0000 [IU] | Freq: Every day | SUBCUTANEOUS | 1 refills | Status: DC

## 2016-11-14 NOTE — Telephone Encounter (Signed)
Refill submitted per patient's request.  

## 2016-11-14 NOTE — Telephone Encounter (Signed)
Patient need a refill of insulin glargine (LANTUS) 100 UNIT/ML injection    Send to [place], [place] [phone] (Phone) [phone] (Fax)   He is out, can you please send it in today.

## 2016-11-19 ENCOUNTER — Other Ambulatory Visit: Payer: Self-pay

## 2016-11-19 MED ORDER — INSULIN GLARGINE 100 UNIT/ML ~~LOC~~ SOLN: 22.0000 [IU] | Freq: Every day | SUBCUTANEOUS | 1 refills | Status: DC

## 2016-11-19 NOTE — Telephone Encounter (Signed)
Sent!

## 2016-11-19 NOTE — Telephone Encounter (Signed)
Pt needs lantus long lasting called to [facility] in [place] call in asap he is heading there now.

## 2016-11-23 ENCOUNTER — Other Ambulatory Visit: Payer: Self-pay | Admitting: *Deleted

## 2016-11-23 MED ORDER — INSULIN GLARGINE 100 UNITS/ML SOLOSTAR PEN: 100.0000 [IU] | PEN_INJECTOR | Freq: Every day | SUBCUTANEOUS | 11 refills | Status: DC

## 2016-11-23 NOTE — Telephone Encounter (Signed)
Pt wife called in and said that they need the Lantus Pens sent in not the vials, please correct and resubmit ASAP.

## 2016-11-27 MED ORDER — INSULIN GLARGINE 100 UNIT/ML SOLOSTAR PEN: 100.0000 [IU] | PEN_INJECTOR | Freq: Every day | SUBCUTANEOUS | 2 refills | Status: DC

## 2016-11-27 NOTE — Telephone Encounter (Signed)
Refill submitted for the pens.

## 2016-11-28 DIAGNOSIS — C61 Malignant neoplasm of prostate: Secondary | ICD-10-CM | POA: Diagnosis not present

## 2016-11-28 DIAGNOSIS — I10 Essential (primary) hypertension: Secondary | ICD-10-CM | POA: Diagnosis not present

## 2016-11-28 DIAGNOSIS — E1140 Type 2 diabetes mellitus with diabetic neuropathy, unspecified: Secondary | ICD-10-CM | POA: Diagnosis not present

## 2016-11-28 DIAGNOSIS — M1711 Unilateral primary osteoarthritis, right knee: Secondary | ICD-10-CM | POA: Diagnosis not present

## 2016-11-28 DIAGNOSIS — Z794 Long term (current) use of insulin: Secondary | ICD-10-CM | POA: Diagnosis not present

## 2016-11-29 DIAGNOSIS — M170 Bilateral primary osteoarthritis of knee: Secondary | ICD-10-CM | POA: Diagnosis not present

## 2016-11-29 DIAGNOSIS — M25562 Pain in left knee: Secondary | ICD-10-CM | POA: Diagnosis not present

## 2016-12-09 ENCOUNTER — Emergency Department: Payer: Medicare Other

## 2016-12-09 ENCOUNTER — Emergency Department
Admission: EM | Admit: 2016-12-09 | Discharge: 2016-12-09 | Disposition: A | Discharge status: Home / Self Care | Payer: Medicare Other | Attending: Emergency Medicine | Admitting: Emergency Medicine

## 2016-12-09 ENCOUNTER — Encounter: Payer: Self-pay | Admitting: Emergency Medicine

## 2016-12-09 DIAGNOSIS — Z79899 Other long term (current) drug therapy: Secondary | ICD-10-CM | POA: Diagnosis not present

## 2016-12-09 DIAGNOSIS — E109 Type 1 diabetes mellitus without complications: Secondary | ICD-10-CM | POA: Diagnosis not present

## 2016-12-09 DIAGNOSIS — I10 Essential (primary) hypertension: Secondary | ICD-10-CM | POA: Diagnosis not present

## 2016-12-09 DIAGNOSIS — Z7982 Long term (current) use of aspirin: Secondary | ICD-10-CM | POA: Insufficient documentation

## 2016-12-09 DIAGNOSIS — E869 Volume depletion, unspecified: Secondary | ICD-10-CM | POA: Insufficient documentation

## 2016-12-09 DIAGNOSIS — Z8546 Personal history of malignant neoplasm of prostate: Secondary | ICD-10-CM | POA: Diagnosis not present

## 2016-12-09 DIAGNOSIS — J09X2 Influenza due to identified novel influenza A virus with other respiratory manifestations: Secondary | ICD-10-CM | POA: Insufficient documentation

## 2016-12-09 DIAGNOSIS — R05 Cough: Secondary | ICD-10-CM | POA: Diagnosis not present

## 2016-12-09 DIAGNOSIS — J449 Chronic obstructive pulmonary disease, unspecified: Secondary | ICD-10-CM | POA: Diagnosis not present

## 2016-12-09 DIAGNOSIS — Z794 Long term (current) use of insulin: Secondary | ICD-10-CM | POA: Diagnosis not present

## 2016-12-09 DIAGNOSIS — J101 Influenza due to other identified influenza virus with other respiratory manifestations: Secondary | ICD-10-CM

## 2016-12-09 DIAGNOSIS — R531 Weakness: Secondary | ICD-10-CM | POA: Diagnosis present

## 2016-12-09 HISTORY — DX: Malignant neoplasm of prostate: C61

## 2016-12-09 LAB — BASIC METABOLIC PANEL
Anion gap: 13 (ref 5–15)
BUN: 12 mg/dL (ref 6–20)
CHLORIDE: 99 mmol/L — AB (ref 101–111)
CO2: 21 mmol/L — ABNORMAL LOW (ref 22–32)
Calcium: 9 mg/dL (ref 8.9–10.3)
Creatinine, Ser: 0.95 mg/dL (ref 0.61–1.24)
GFR calc Af Amer: >60 mL/min (ref >60)
GFR calc non Af Amer: >60 mL/min (ref >60)
GLUCOSE: 350 mg/dL — AB (ref 65–99)
POTASSIUM: 3.6 mmol/L (ref 3.5–5.1)
SODIUM: 133 mmol/L — AB (ref 135–145)

## 2016-12-09 LAB — HEPATIC FUNCTION PANEL
ALBUMIN: 3.8 g/dL (ref 3.5–5.0)
ALK PHOS: 126 U/L (ref 38–126)
ALT: 10 U/L — ABNORMAL LOW (ref 17–63)
AST: 15 U/L (ref 15–41)
BILIRUBIN TOTAL: 0.9 mg/dL (ref 0.3–1.2)
Bilirubin, Direct: 0.2 mg/dL (ref 0.1–0.5)
Indirect Bilirubin: 0.7 mg/dL (ref 0.3–0.9)
Total Protein: 6.7 g/dL (ref 6.5–8.1)

## 2016-12-09 LAB — INFLUENZA PANEL BY PCR (TYPE A & B)
Influenza A By PCR: POSITIVE — AB
Influenza B By PCR: NEGATIVE

## 2016-12-09 LAB — CBC
HEMATOCRIT: 37.7 % — AB (ref 40.0–52.0)
Hemoglobin: 12.8 g/dL — ABNORMAL LOW (ref 13.0–18.0)
MCH: 31.6 pg (ref 26.0–34.0)
MCHC: 34 g/dL (ref 32.0–36.0)
MCV: 92.7 fL (ref 80.0–100.0)
Platelets: 177 10*3/uL (ref 150–440)
RBC: 4.07 MIL/uL — ABNORMAL LOW (ref 4.40–5.90)
RDW: 14.4 % (ref 11.5–14.5)
WBC: 6.9 10*3/uL (ref 3.8–10.6)

## 2016-12-09 LAB — LIPASE, BLOOD

## 2016-12-09 LAB — GLUCOSE, CAPILLARY: Glucose-Capillary: 336 mg/dL — ABNORMAL HIGH (ref 65–99)

## 2016-12-09 LAB — TROPONIN I: Troponin I: <0.03 ng/mL (ref <0.03)

## 2016-12-09 MED ORDER — HYDROCODONE-HOMATROPINE 5-1.5 MG/5ML PO SYRP: 5.0000 mL | ORAL_SOLUTION | Freq: Four times a day (QID) | ORAL | 0 refills | Status: DC | PRN

## 2016-12-09 MED ORDER — SODIUM CHLORIDE 0.9 % IV BOLUS (SEPSIS)
1000.0000 mL | INTRAVENOUS | Status: AC
  Administered 2016-12-09: 1000 mL via INTRAVENOUS

## 2016-12-09 MED ORDER — HYDROCODONE-ACETAMINOPHEN 5-325 MG PO TABS: 1.0000 | ORAL_TABLET | Freq: Once | ORAL | Status: DC

## 2016-12-09 NOTE — Discharge Instructions (Signed)

## 2016-12-09 NOTE — ED Provider Notes (Addendum)
[facility] Emergency Department Provider Note  ____________________________________________   First MD Initiated Contact with Patient 12/09/16 1814     (approximate)  I have reviewed the triage vital signs and the nursing notes.   HISTORY  Chief Complaint Generalized Body Aches and Weakness    HPI Dylan Quinn is a 80 y.o. male with a history that includes insulin-dependent diabeteswho presents for evaluation of several days of generalized weakness, myalgias, fatigue which has been gradual in onset but getting worse.  He states that he has been eating and drinking less than usual because he just does not feel well.  He denies chest pain and shortness of breath but has had runny nose, congestion, and cough that is been nonproductive.  He denies abdominal pain, nausea, vomiting, diarrhea.  He has been going to the bathroom more than usual.  He has been taking his insulin and denies dysuria.  He has not had any neck pain or neck stiffness.  He has had a mild headache for the last several days.  Occasional chills but no known fever.   Past Medical History:  Diagnosis Date  . BENIGN PROSTATIC HYPERTROPHY 07/19/2009  . COLONIC POLYPS 02/14/2010  . DEPRESSION 03/16/2008  . DIABETES MELLITUS, TYPE I 06/30/2007  . Dyslipidemia   . FOLLICULITIS 0000000  . GLAUCOMA 07/19/2009  . HEARING LOSS 02/14/2010  . HYPERTENSION 05/14/2008  . HYPOGONADISM, MALE 12/16/2007  . Leukopenia   . LUMBAR RADICULOPATHY, LEFT 02/23/2010  . Other osteoporosis 12/16/2007  . PERIPHERAL NEUROPATHY 06/30/2007  . Prostate cancer ([place])   . PROTEINURIA, MILD 02/14/2010  . Psoriasis   . URINARY CALCULUS 12/27/2010    Patient Active Problem List   Diagnosis Date Noted  . Right knee pain 04/18/2016  . Primary osteoarthritis of right knee 04/18/2016  . COPD (chronic obstructive pulmonary disease) ([place]) 12/22/2015  . Type 1 diabetes mellitus with nephropathy ([place]) 12/12/2015  . URI (upper  respiratory infection) 12/09/2015  . Normocytic anemia 05/04/2013  . Insomnia 07/28/2012  . HEARING LOSS 02/14/2010  . PROTEINURIA, MILD 02/14/2010  . GLAUCOMA 07/19/2009  . Prostate cancer ([place]) 07/19/2009  . Essential hypertension 05/14/2008  . Other osteoporosis 12/16/2007  . 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 PERIPHERAL NEUROPATHY 06/30/2007    Past Surgical History:  Procedure Laterality Date  . APPENDECTOMY  1986  . CATARACT EXTRACTION  2006  . CATARACT EXTRACTION  1997    Prior to Admission medications   Medication Sig Start Date End Date Taking? Authorizing Provider  aspirin (GOODSENSE ASPIRIN) 81 MG chewable tablet Chew by mouth.    Historical Provider, MD  brimonidine-timolol (COMBIGAN) 0.2-0.5 % ophthalmic solution Place 1 drop into both eyes 2 (two) times daily.     Historical Provider, MD  clobetasol cream (TEMOVATE) [id number] % Apply 1 application topically 2 (two) times daily as needed (for rash).     Historical Provider, MD  colchicine 0.6 MG tablet Reported on 06/13/2016 03/14/16   Historical Provider, MD  dorzolamide (TRUSOPT) 2 % ophthalmic solution Place 1 drop into both eyes 2 (two) times daily.    Historical Provider, MD  glucagon (GLUCAGON EMERGENCY) 1 MG injection Inject 1 mg into the muscle once as needed. Patient not taking: Reported on 10/09/2016 09/28/14   Philemon Kingdom, MD  hydrochlorothiazide (HYDRODIURIL) 25 MG tablet Take 1 tablet (25 mg total) by mouth daily. 12/12/15   Janith Lima, MD  HYDROcodone-homatropine [facility]) 5-1.5 MG/5ML syrup Take 5 mLs by  mouth every 6 (six) hours as needed for cough. 12/09/16   Hinda Kehr, MD  Insulin Glargine (LANTUS) 100 UNIT/ML Solostar Pen Inject 100 Units into the skin daily at 10 pm. 11/27/16   Philemon Kingdom, MD  insulin glargine (LANTUS) 100 unit/mL SOPN Inject 1 mL (100 Units total) into the skin at bedtime. 11/23/16   Philemon Kingdom, MD  insulin lispro (HUMALOG) 100  UNIT/ML injection Inject 7-15 Units into the skin 3 (three) times daily with meals.    Historical Provider, MD  latanoprost (XALATAN) 0.005 % ophthalmic solution Place 1 drop into both eyes at bedtime.    Historical Provider, MD  olmesartan (BENICAR) 40 MG tablet Take 1 tablet (40 mg total) by mouth daily. 12/12/15   Janith Lima, MD  omega-3 acid ethyl esters (LOVAZA) 1 g capsule Take by mouth.    Historical Provider, MD  pregabalin (LYRICA) 100 MG capsule Take 1 capsule (100 mg total) by mouth 2 (two) times daily. 12/12/15   Janith Lima, MD  traMADol (ULTRAM) 50 MG tablet Take by mouth every 6 (six) hours as needed.    Historical Provider, MD    Allergies Lisinopril  Family History  Problem Relation Age of Onset  . Cancer Father     Colon Cancer  . Heart disease Father   . High blood pressure      Social History Social History  Substance Use Topics  . Smoking status: Never Smoker  . Smokeless tobacco: Never Used  . Alcohol use No     Comment: rare    Review of Systems Constitutional: Subjective fever/chills, myalgias, generalized weakness and fatigue Eyes: No visual changes. ENT: No sore throat. +Congestion and runny nose Cardiovascular: Denies chest pain. Respiratory: Denies shortness of breath.  Non-productive cough Gastrointestinal: No abdominal pain.  No nausea, no vomiting.  No diarrhea.  No constipation. Genitourinary: Negative for dysuria.  +Polyuria Musculoskeletal: Negative for back pain. Skin: Negative for rash. Neurological: Negative for headaches, focal weakness or numbness.  10-point ROS otherwise negative.  ____________________________________________   PHYSICAL EXAM:  VITAL SIGNS: ED Triage Vitals  Enc Vitals Group     BP 12/09/16 1543 (!) 150/69     Pulse Rate 12/09/16 1543 (!) 109     Resp --      Temp 12/09/16 1543 98 F (36.7 C)     Temp Source 12/09/16 1543 Oral     SpO2 12/09/16 1543 98 %     Weight 12/09/16 1543 203 lb (92.1 kg)      Height 12/09/16 1543 6' 0.5" (1.842 m)     Head Circumference --      Peak Flow --      Pain Score 12/09/16 1600 7     Pain Loc --      Pain Edu? --      Excl. in [place]? --     Constitutional: Alert and oriented. No acute distress but does appear uncomfortable. Eyes: Conjunctivae are normal. PERRL. EOMI. Head: Atraumatic. Nose: Mild congestion/rhinnorhea. Mouth/Throat: Mucous membranes are moist.  Oropharynx non-erythematous. Neck: No stridor.  No meningeal signs.   Cardiovascular: Mild tachycardia, regular rhythm. Good peripheral circulation. Grossly normal heart sounds. Respiratory: Normal respiratory effort.  No retractions. Lungs CTAB. Gastrointestinal: Soft and nontender. No distention.  Musculoskeletal: No lower extremity tenderness nor edema. No gross deformities of extremities. Neurologic:  Normal speech and language. No gross focal neurologic deficits are appreciated.  Skin:  Skin is warm, dry and intact. No rash noted. Psychiatric:  Mood and affect are normal. Speech and behavior are normal.  ____________________________________________   LABS (all labs ordered are listed, but only abnormal results are displayed)  Labs Reviewed  BASIC METABOLIC PANEL - Abnormal; Notable for the following:       Result Value   Sodium 133 (*)    Chloride 99 (*)    CO2 21 (*)    Glucose, Bld 350 (*)    All other components within normal limits  CBC - Abnormal; Notable for the following:    RBC 4.07 (*)    Hemoglobin 12.8 (*)    HCT 37.7 (*)    All other components within normal limits  HEPATIC FUNCTION PANEL - Abnormal; Notable for the following:    ALT 10 (*)    All other components within normal limits  LIPASE, BLOOD - Abnormal; Notable for the following:    Lipase <10 (*)    All other components within normal limits  INFLUENZA PANEL BY PCR (TYPE A & B, H1N1) - Abnormal; Notable for the following:    Influenza A By PCR POSITIVE (*)    All other components within normal limits   GLUCOSE, CAPILLARY - Abnormal; Notable for the following:    Glucose-Capillary 336 (*)    All other components within normal limits  TROPONIN I  URINALYSIS, COMPLETE (UACMP) WITH MICROSCOPIC   ____________________________________________  EKG  ED ECG REPORT I, Kimble Hitchens, the attending physician, personally viewed and interpreted this ECG.  Date: 12/09/2016 EKG Time: 16:10 Rate: 89 Rhythm: normal sinus rhythm QRS Axis: normal Intervals: normal ST/T Wave abnormalities: normal Conduction Disturbances: none Narrative Interpretation: unremarkable  ____________________________________________  RADIOLOGY   Dg Chest 2 View  Result Date: 12/09/2016 CLINICAL DATA:  Cough, body aches, malaise X 3 days. Non smoker. diabetic EXAM: CHEST  2 VIEW COMPARISON:  12/21/2015 FINDINGS: Heart size is normal. Aorta is mildly tortuous. There is stable elevation of left hemidiaphragm. Mildly prominent interstitial markings are stable. There are no focal consolidations or pleural effusions. No pulmonary edema. IMPRESSION: No evidence for acute cardiopulmonary abnormality. Electronically Signed   By: Nolon Nations M.D.   On: 12/09/2016 17:44    ____________________________________________   PROCEDURES  Procedure(s) performed:   Procedures   Critical Care performed: No ____________________________________________   INITIAL IMPRESSION / ASSESSMENT AND PLAN / ED COURSE  Pertinent labs & imaging results that were available during my care of the patient were reviewed by me and considered in my medical decision making (see chart for details).  Signs and symptoms are most consistent with viral infection.  I will test influenza and obtain a chest x-ray.  I will give a liter of fluids for his mild tachycardia, decreased by mouth intake, and hyperglycemia.  There is no indication for admission at this time and I discussed this with the patient and family he likely simply need several more  days to recover.  They are comfortable with the plan and I will reassess after the rest of the workup.   Clinical Course as of Dec 10 2119  Nancy Fetter Dec 09, 2016  1828 No evidence of PNA DG Chest 2 View [CF]  1943 Influenza A positive.  Given age, and onset of symptoms within the last two days, will give Tamiflu and recommend close outpatient follow up.  [CF]  1953 I informed the patient and his wife about the influenza A diagnosis.  We discussed Tamiflu and its question will efficacy, and the patient and his wife feel very strongly against  Tamiflu due to some side effects that their son had when he took it.  So they feel very comfortable without the Tamiflu.  There is no indication to admit the patient at this point, but I gave strict return precautions if he were to get worse.  They understand and agree with the plan.  [CF]  2116 The patient is aggravated because "you didn't do shit" and wants to go home, but his wife is stating that she is not comfortable taking him home because "he is not stable".  However his vital signs remained stable, he is not tachycardic, he is not short of breath, he is not hypoxemic, he is in no acute distress except for feeling bad all over due to influenza.  We reiterated all of this with the patient and family and encouraged him to follow up with her PCP tomorrow.  There is no indication for admission and there is nothing that they would be able to do for the patient in the hospital to make him better.  [CF]    Clinical Course User Index [CF] Hinda Kehr, MD    ____________________________________________  FINAL CLINICAL IMPRESSION(S) / ED DIAGNOSES  Final diagnoses:  Influenza A  Volume depletion     MEDICATIONS GIVEN DURING THIS VISIT:  Medications  HYDROcodone-acetaminophen (NORCO/VICODIN) 5-325 MG per tablet 1 tablet (not administered)  sodium chloride 0.9 % bolus 1,000 mL (0 mLs Intravenous Stopped 12/09/16 2053)     NEW OUTPATIENT MEDICATIONS STARTED  DURING THIS VISIT:  New Prescriptions   HYDROCODONE-HOMATROPINE (HYCODAN) 5-1.5 MG/5ML SYRUP    Take 5 mLs by mouth every 6 (six) hours as needed for cough.    Modified Medications   No medications on file    Discontinued Medications   No medications on file     Note:  This document was prepared using Dragon voice recognition software and may include unintentional dictation errors.    Hinda Kehr, MD 12/09/16 [id number]    Hinda Kehr, MD 12/09/16 2120

## 2016-12-09 NOTE — ED Triage Notes (Signed)
Pt c/o generalized weakness, and fatigue for 2 days. Has also had a cough. Sugars have ran a little higher than normal, over 300.  Denies fevers. Has had decreased appetite.  denies NVD. Has had increased urination. Denies dysuria.

## 2016-12-09 NOTE — ED Notes (Signed)
Pt. Verbalizes understanding of d/c instructions and follow-up. VS stable and pain controlled per pt.  Pt. In NAD at time of d/c and denies further concerns regarding this visit. Pt. Stable at the time of departure from the unit, departing unit by the safest and most appropriate manner per that pt condition and limitations. Pt advised to return to the ED at any time for emergent concerns, or for new/worsening symptoms.   

## 2016-12-11 ENCOUNTER — Telehealth: Payer: Self-pay | Admitting: Internal Medicine

## 2016-12-11 ENCOUNTER — Encounter (HOSPITAL_COMMUNITY): Payer: Self-pay | Admitting: Emergency Medicine

## 2016-12-11 ENCOUNTER — Inpatient Hospital Stay (HOSPITAL_COMMUNITY)
Admission: EM | Admit: 2016-12-11 | Discharge: 2016-12-23 | DRG: 637 | Disposition: A | Discharge status: Skilled Nursing Facility | Payer: Medicare Other | Attending: Family Medicine | Admitting: Family Medicine

## 2016-12-11 ENCOUNTER — Encounter: Payer: Self-pay | Admitting: Internal Medicine

## 2016-12-11 ENCOUNTER — Observation Stay (HOSPITAL_COMMUNITY): Payer: Medicare Other

## 2016-12-11 DIAGNOSIS — M810 Age-related osteoporosis without current pathological fracture: Secondary | ICD-10-CM | POA: Diagnosis present

## 2016-12-11 DIAGNOSIS — R042 Hemoptysis: Secondary | ICD-10-CM | POA: Diagnosis not present

## 2016-12-11 DIAGNOSIS — E1010 Type 1 diabetes mellitus with ketoacidosis without coma: Secondary | ICD-10-CM | POA: Diagnosis present

## 2016-12-11 DIAGNOSIS — H9190 Unspecified hearing loss, unspecified ear: Secondary | ICD-10-CM | POA: Diagnosis present

## 2016-12-11 DIAGNOSIS — R05 Cough: Secondary | ICD-10-CM | POA: Diagnosis not present

## 2016-12-11 DIAGNOSIS — Z9842 Cataract extraction status, left eye: Secondary | ICD-10-CM

## 2016-12-11 DIAGNOSIS — B9562 Methicillin resistant Staphylococcus aureus infection as the cause of diseases classified elsewhere: Secondary | ICD-10-CM | POA: Diagnosis not present

## 2016-12-11 DIAGNOSIS — Z79899 Other long term (current) drug therapy: Secondary | ICD-10-CM | POA: Diagnosis not present

## 2016-12-11 DIAGNOSIS — J189 Pneumonia, unspecified organism: Secondary | ICD-10-CM | POA: Diagnosis not present

## 2016-12-11 DIAGNOSIS — E876 Hypokalemia: Secondary | ICD-10-CM | POA: Diagnosis present

## 2016-12-11 DIAGNOSIS — E1065 Type 1 diabetes mellitus with hyperglycemia: Secondary | ICD-10-CM | POA: Diagnosis not present

## 2016-12-11 DIAGNOSIS — Z7982 Long term (current) use of aspirin: Secondary | ICD-10-CM | POA: Diagnosis not present

## 2016-12-11 DIAGNOSIS — Z9841 Cataract extraction status, right eye: Secondary | ICD-10-CM

## 2016-12-11 DIAGNOSIS — R7881 Bacteremia: Secondary | ICD-10-CM | POA: Diagnosis present

## 2016-12-11 DIAGNOSIS — M6281 Muscle weakness (generalized): Secondary | ICD-10-CM

## 2016-12-11 DIAGNOSIS — Q211 Atrial septal defect: Secondary | ICD-10-CM

## 2016-12-11 DIAGNOSIS — J1100 Influenza due to unidentified influenza virus with unspecified type of pneumonia: Secondary | ICD-10-CM | POA: Diagnosis not present

## 2016-12-11 DIAGNOSIS — I10 Essential (primary) hypertension: Secondary | ICD-10-CM | POA: Diagnosis present

## 2016-12-11 DIAGNOSIS — Z794 Long term (current) use of insulin: Secondary | ICD-10-CM | POA: Diagnosis not present

## 2016-12-11 DIAGNOSIS — R531 Weakness: Secondary | ICD-10-CM | POA: Diagnosis not present

## 2016-12-11 DIAGNOSIS — J069 Acute upper respiratory infection, unspecified: Secondary | ICD-10-CM | POA: Diagnosis present

## 2016-12-11 DIAGNOSIS — E1043 Type 1 diabetes mellitus with diabetic autonomic (poly)neuropathy: Secondary | ICD-10-CM | POA: Diagnosis not present

## 2016-12-11 DIAGNOSIS — H409 Unspecified glaucoma: Secondary | ICD-10-CM | POA: Diagnosis present

## 2016-12-11 DIAGNOSIS — Z888 Allergy status to other drugs, medicaments and biological substances status: Secondary | ICD-10-CM

## 2016-12-11 DIAGNOSIS — K5900 Constipation, unspecified: Secondary | ICD-10-CM | POA: Diagnosis not present

## 2016-12-11 DIAGNOSIS — E785 Hyperlipidemia, unspecified: Secondary | ICD-10-CM | POA: Diagnosis present

## 2016-12-11 DIAGNOSIS — J101 Influenza due to other identified influenza virus with other respiratory manifestations: Secondary | ICD-10-CM | POA: Diagnosis present

## 2016-12-11 DIAGNOSIS — E1159 Type 2 diabetes mellitus with other circulatory complications: Secondary | ICD-10-CM | POA: Diagnosis present

## 2016-12-11 DIAGNOSIS — R112 Nausea with vomiting, unspecified: Secondary | ICD-10-CM | POA: Diagnosis not present

## 2016-12-11 DIAGNOSIS — R0602 Shortness of breath: Secondary | ICD-10-CM

## 2016-12-11 DIAGNOSIS — R059 Cough, unspecified: Secondary | ICD-10-CM

## 2016-12-11 DIAGNOSIS — R404 Transient alteration of awareness: Secondary | ICD-10-CM | POA: Diagnosis not present

## 2016-12-11 DIAGNOSIS — J111 Influenza due to unidentified influenza virus with other respiratory manifestations: Secondary | ICD-10-CM | POA: Diagnosis not present

## 2016-12-11 DIAGNOSIS — Z9049 Acquired absence of other specified parts of digestive tract: Secondary | ICD-10-CM | POA: Diagnosis not present

## 2016-12-11 DIAGNOSIS — Z8546 Personal history of malignant neoplasm of prostate: Secondary | ICD-10-CM

## 2016-12-11 DIAGNOSIS — Y95 Nosocomial condition: Secondary | ICD-10-CM | POA: Diagnosis not present

## 2016-12-11 DIAGNOSIS — R079 Chest pain, unspecified: Secondary | ICD-10-CM | POA: Diagnosis not present

## 2016-12-11 DIAGNOSIS — I152 Hypertension secondary to endocrine disorders: Secondary | ICD-10-CM | POA: Diagnosis present

## 2016-12-11 LAB — TROPONIN I: TROPONIN I: 0.04 ng/mL — AB (ref <0.03)

## 2016-12-11 LAB — BASIC METABOLIC PANEL
Anion gap: 9 (ref 5–15)
Anion gap: 10 (ref 5–15)
BUN: 15 mg/dL (ref 6–20)
BUN: 12 mg/dL (ref 6–20)
CALCIUM: 8.4 mg/dL — AB (ref 8.9–10.3)
CALCIUM: 8.5 mg/dL — AB (ref 8.9–10.3)
CO2: 20 mmol/L — ABNORMAL LOW (ref 22–32)
CO2: 21 mmol/L — AB (ref 22–32)
CREATININE: 1.13 mg/dL (ref 0.61–1.24)
CREATININE: 0.94 mg/dL (ref 0.61–1.24)
Chloride: 109 mmol/L (ref 101–111)
Chloride: 106 mmol/L (ref 101–111)
GLUCOSE: 148 mg/dL — AB (ref 65–99)
Glucose, Bld: 228 mg/dL — ABNORMAL HIGH (ref 65–99)
Potassium: 3.3 mmol/L — ABNORMAL LOW (ref 3.5–5.1)
Potassium: 3.2 mmol/L — ABNORMAL LOW (ref 3.5–5.1)
SODIUM: 138 mmol/L (ref 135–145)
Sodium: 137 mmol/L (ref 135–145)

## 2016-12-11 LAB — URINALYSIS, ROUTINE W REFLEX MICROSCOPIC
BACTERIA UA: NONE SEEN
Bilirubin Urine: NEGATIVE
Glucose, UA: >=500 mg/dL — AB
Ketones, ur: 80 mg/dL — AB
Leukocytes, UA: NEGATIVE
Nitrite: NEGATIVE
Protein, ur: 100 mg/dL — AB
SPECIFIC GRAVITY, URINE: 1.022 (ref 1.005–1.030)
pH: 5 (ref 5.0–8.0)

## 2016-12-11 LAB — GLUCOSE, CAPILLARY
GLUCOSE-CAPILLARY: 179 mg/dL — AB (ref 65–99)
GLUCOSE-CAPILLARY: 181 mg/dL — AB (ref 65–99)
GLUCOSE-CAPILLARY: 144 mg/dL — AB (ref 65–99)
GLUCOSE-CAPILLARY: 156 mg/dL — AB (ref 65–99)
GLUCOSE-CAPILLARY: 266 mg/dL — AB (ref 65–99)
Glucose-Capillary: 260 mg/dL — ABNORMAL HIGH (ref 65–99)
Glucose-Capillary: 237 mg/dL — ABNORMAL HIGH (ref 65–99)
Glucose-Capillary: 237 mg/dL — ABNORMAL HIGH (ref 65–99)
Glucose-Capillary: 129 mg/dL — ABNORMAL HIGH (ref 65–99)
Glucose-Capillary: 132 mg/dL — ABNORMAL HIGH (ref 65–99)
Glucose-Capillary: 172 mg/dL — ABNORMAL HIGH (ref 65–99)

## 2016-12-11 LAB — COMPREHENSIVE METABOLIC PANEL
ALBUMIN: 3.9 g/dL (ref 3.5–5.0)
ALK PHOS: 130 U/L — AB (ref 38–126)
ALT: 14 U/L — ABNORMAL LOW (ref 17–63)
ANION GAP: 16 — AB (ref 5–15)
AST: 21 U/L (ref 15–41)
BILIRUBIN TOTAL: 0.8 mg/dL (ref 0.3–1.2)
BUN: 20 mg/dL (ref 6–20)
CO2: 17 mmol/L — ABNORMAL LOW (ref 22–32)
Calcium: 9.1 mg/dL (ref 8.9–10.3)
Chloride: 101 mmol/L (ref 101–111)
Creatinine, Ser: 1.15 mg/dL (ref 0.61–1.24)
GFR calc Af Amer: >60 mL/min (ref >60)
GFR calc non Af Amer: 59 mL/min — ABNORMAL LOW (ref >60)
GLUCOSE: 295 mg/dL — AB (ref 65–99)
Potassium: 3.8 mmol/L (ref 3.5–5.1)
Sodium: 134 mmol/L — ABNORMAL LOW (ref 135–145)
TOTAL PROTEIN: 7.3 g/dL (ref 6.5–8.1)

## 2016-12-11 LAB — CBG MONITORING, ED
GLUCOSE-CAPILLARY: 275 mg/dL — AB (ref 65–99)
Glucose-Capillary: 280 mg/dL — ABNORMAL HIGH (ref 65–99)
Glucose-Capillary: 269 mg/dL — ABNORMAL HIGH (ref 65–99)

## 2016-12-11 LAB — CBC
HCT: 32.9 % — ABNORMAL LOW (ref 39.0–52.0)
HEMATOCRIT: 39.1 % (ref 39.0–52.0)
HEMOGLOBIN: 13 g/dL (ref 13.0–17.0)
Hemoglobin: 11.5 g/dL — ABNORMAL LOW (ref 13.0–17.0)
MCH: 30.2 pg (ref 26.0–34.0)
MCH: 30.8 pg (ref 26.0–34.0)
MCHC: 33.2 g/dL (ref 30.0–36.0)
MCHC: 35 g/dL (ref 30.0–36.0)
MCV: 90.7 fL (ref 78.0–100.0)
MCV: 88.2 fL (ref 78.0–100.0)
PLATELETS: 177 10*3/uL (ref 150–400)
Platelets: 194 10*3/uL (ref 150–400)
RBC: 4.31 MIL/uL (ref 4.22–5.81)
RBC: 3.73 MIL/uL — AB (ref 4.22–5.81)
RDW: 14.2 % (ref 11.5–15.5)
RDW: 14.3 % (ref 11.5–15.5)
WBC: 7 10*3/uL (ref 4.0–10.5)
WBC: 6.6 10*3/uL (ref 4.0–10.5)

## 2016-12-11 LAB — RAPID URINE DRUG SCREEN, HOSP PERFORMED
Amphetamines: NOT DETECTED
BARBITURATES: NOT DETECTED
Benzodiazepines: NOT DETECTED
COCAINE: NOT DETECTED
Opiates: NOT DETECTED
Tetrahydrocannabinol: NOT DETECTED

## 2016-12-11 LAB — INFLUENZA PANEL BY PCR (TYPE A & B)
INFLAPCR: POSITIVE — AB
INFLBPCR: NEGATIVE

## 2016-12-11 LAB — LIPASE, BLOOD: Lipase: 18 U/L (ref 11–51)

## 2016-12-11 LAB — MRSA PCR SCREENING: MRSA BY PCR: NEGATIVE

## 2016-12-11 MED ORDER — INSULIN GLARGINE 100 UNIT/ML ~~LOC~~ SOLN
22.0000 [IU] | Freq: Every day | SUBCUTANEOUS | Status: DC
  Administered 2016-12-11 – 2016-12-12 (×2): 22 [IU] via SUBCUTANEOUS
  Filled 2016-12-11 (×3): qty 0.22

## 2016-12-11 MED ORDER — TIMOLOL MALEATE 0.5 % OP SOLN
1.0000 [drp] | Freq: Two times a day (BID) | OPHTHALMIC | Status: DC
  Administered 2016-12-11 – 2016-12-23 (×23): 1 [drp] via OPHTHALMIC
  Filled 2016-12-11: qty 5

## 2016-12-11 MED ORDER — LATANOPROST 0.005 % OP SOLN
1.0000 [drp] | Freq: Every day | OPHTHALMIC | Status: DC
  Administered 2016-12-11 – 2016-12-22 (×11): 1 [drp] via OPHTHALMIC
  Filled 2016-12-11: qty 2.5

## 2016-12-11 MED ORDER — DORZOLAMIDE HCL 2 % OP SOLN
1.0000 [drp] | Freq: Two times a day (BID) | OPHTHALMIC | Status: DC
  Administered 2016-12-11 – 2016-12-23 (×23): 1 [drp] via OPHTHALMIC
  Filled 2016-12-11: qty 10

## 2016-12-11 MED ORDER — HYDRALAZINE HCL 20 MG/ML IJ SOLN
5.0000 mg | Freq: Four times a day (QID) | INTRAMUSCULAR | Status: DC | PRN
  Administered 2016-12-11 – 2016-12-12 (×4): 5 mg via INTRAVENOUS
  Filled 2016-12-11 (×4): qty 1

## 2016-12-11 MED ORDER — POTASSIUM CHLORIDE 2 MEQ/ML IV SOLN: 30.0000 meq | Freq: Once | INTRAVENOUS | Status: DC

## 2016-12-11 MED ORDER — KETOROLAC TROMETHAMINE 15 MG/ML IJ SOLN
15.0000 mg | Freq: Four times a day (QID) | INTRAMUSCULAR | Status: AC | PRN
  Administered 2016-12-11 – 2016-12-16 (×6): 15 mg via INTRAVENOUS
  Filled 2016-12-11 (×7): qty 1

## 2016-12-11 MED ORDER — DEXTROSE 50 % IV SOLN: 25.0000 mL | INTRAVENOUS | Status: DC | PRN

## 2016-12-11 MED ORDER — KETOROLAC TROMETHAMINE 15 MG/ML IJ SOLN
INTRAMUSCULAR | Status: AC
  Filled 2016-12-11: qty 1

## 2016-12-11 MED ORDER — POTASSIUM CHLORIDE CRYS ER 20 MEQ PO TBCR: 40.0000 meq | EXTENDED_RELEASE_TABLET | Freq: Once | ORAL | Status: DC

## 2016-12-11 MED ORDER — IRBESARTAN 300 MG PO TABS
300.0000 mg | ORAL_TABLET | Freq: Every day | ORAL | Status: DC
  Administered 2016-12-12 – 2016-12-14 (×3): 300 mg via ORAL
  Filled 2016-12-11 (×5): qty 1

## 2016-12-11 MED ORDER — OSELTAMIVIR PHOSPHATE 75 MG PO CAPS
75.0000 mg | ORAL_CAPSULE | Freq: Two times a day (BID) | ORAL | Status: AC
  Administered 2016-12-11 – 2016-12-15 (×9): 75 mg via ORAL
  Filled 2016-12-11 (×10): qty 1

## 2016-12-11 MED ORDER — BRIMONIDINE TARTRATE 0.2 % OP SOLN
1.0000 [drp] | Freq: Two times a day (BID) | OPHTHALMIC | Status: DC
  Administered 2016-12-11 – 2016-12-23 (×23): 1 [drp] via OPHTHALMIC
  Filled 2016-12-11: qty 5

## 2016-12-11 MED ORDER — INSULIN ASPART 100 UNIT/ML ~~LOC~~ SOLN
0.0000 [IU] | Freq: Every day | SUBCUTANEOUS | Status: DC
  Administered 2016-12-11: 3 [IU] via SUBCUTANEOUS
  Administered 2016-12-12: 2 [IU] via SUBCUTANEOUS
  Administered 2016-12-19: 3 [IU] via SUBCUTANEOUS
  Administered 2016-12-20 – 2016-12-22 (×2): 2 [IU] via SUBCUTANEOUS

## 2016-12-11 MED ORDER — SODIUM CHLORIDE 0.9 % IV SOLN
INTRAVENOUS | Status: DC
  Administered 2016-12-11: 05:00:00 via INTRAVENOUS

## 2016-12-11 MED ORDER — LIP MEDEX EX OINT
TOPICAL_OINTMENT | CUTANEOUS | Status: AC
  Administered 2016-12-11: 20:00:00
  Filled 2016-12-11: qty 7

## 2016-12-11 MED ORDER — DEXTROSE-NACL 5-0.45 % IV SOLN: INTRAVENOUS | Status: DC

## 2016-12-11 MED ORDER — DEXTROSE-NACL 5-0.45 % IV SOLN
INTRAVENOUS | Status: DC
Start: 2016-12-11 — End: 2016-12-12
  Administered 2016-12-11 – 2016-12-12 (×3): via INTRAVENOUS

## 2016-12-11 MED ORDER — POTASSIUM CHLORIDE 10 MEQ/100ML IV SOLN
10.0000 meq | INTRAVENOUS | Status: AC
  Administered 2016-12-11 (×3): 10 meq via INTRAVENOUS
  Filled 2016-12-11 (×3): qty 100

## 2016-12-11 MED ORDER — PREGABALIN 50 MG PO CAPS
100.0000 mg | ORAL_CAPSULE | Freq: Two times a day (BID) | ORAL | Status: DC
  Administered 2016-12-12 – 2016-12-23 (×19): 100 mg via ORAL
  Filled 2016-12-11 (×23): qty 2

## 2016-12-11 MED ORDER — ENOXAPARIN SODIUM 40 MG/0.4ML ~~LOC~~ SOLN
40.0000 mg | SUBCUTANEOUS | Status: DC
  Administered 2016-12-11 – 2016-12-15 (×5): 40 mg via SUBCUTANEOUS
  Filled 2016-12-11 (×5): qty 0.4

## 2016-12-11 MED ORDER — BRIMONIDINE TARTRATE-TIMOLOL 0.2-0.5 % OP SOLN: 1.0000 [drp] | Freq: Two times a day (BID) | OPHTHALMIC | Status: DC

## 2016-12-11 MED ORDER — INSULIN REGULAR BOLUS VIA INFUSION
0.0000 [IU] | Freq: Three times a day (TID) | INTRAVENOUS | Status: DC
  Filled 2016-12-11: qty 10

## 2016-12-11 MED ORDER — LIP MEDEX EX OINT
TOPICAL_OINTMENT | CUTANEOUS | Status: DC | PRN
  Filled 2016-12-11: qty 7

## 2016-12-11 MED ORDER — INSULIN ASPART 100 UNIT/ML ~~LOC~~ SOLN
0.0000 [IU] | Freq: Three times a day (TID) | SUBCUTANEOUS | Status: DC
  Administered 2016-12-11: 3 [IU] via SUBCUTANEOUS
  Administered 2016-12-12: 5 [IU] via SUBCUTANEOUS
  Administered 2016-12-12: 15 [IU] via SUBCUTANEOUS
  Administered 2016-12-12: 8 [IU] via SUBCUTANEOUS
  Administered 2016-12-13: 3 [IU] via SUBCUTANEOUS
  Administered 2016-12-13: 5 [IU] via SUBCUTANEOUS
  Administered 2016-12-13 – 2016-12-14 (×2): 11 [IU] via SUBCUTANEOUS
  Administered 2016-12-14: 8 [IU] via SUBCUTANEOUS
  Administered 2016-12-14: 3 [IU] via SUBCUTANEOUS
  Administered 2016-12-15: 8 [IU] via SUBCUTANEOUS
  Administered 2016-12-15 (×2): 3 [IU] via SUBCUTANEOUS
  Administered 2016-12-16: 2 [IU] via SUBCUTANEOUS
  Administered 2016-12-16: 5 [IU] via SUBCUTANEOUS
  Administered 2016-12-16: 3 [IU] via SUBCUTANEOUS
  Administered 2016-12-17: 2 [IU] via SUBCUTANEOUS
  Administered 2016-12-17: 3 [IU] via SUBCUTANEOUS
  Administered 2016-12-17 – 2016-12-18 (×2): 8 [IU] via SUBCUTANEOUS
  Administered 2016-12-18: 5 [IU] via SUBCUTANEOUS
  Administered 2016-12-19: 2 [IU] via SUBCUTANEOUS
  Administered 2016-12-19: 8 [IU] via SUBCUTANEOUS
  Administered 2016-12-20: 15 [IU] via SUBCUTANEOUS
  Administered 2016-12-20 – 2016-12-21 (×3): 8 [IU] via SUBCUTANEOUS
  Administered 2016-12-21: 5 [IU] via SUBCUTANEOUS
  Administered 2016-12-21: 8 [IU] via SUBCUTANEOUS
  Administered 2016-12-22 (×2): 3 [IU] via SUBCUTANEOUS
  Administered 2016-12-22 – 2016-12-23 (×2): 5 [IU] via SUBCUTANEOUS
  Administered 2016-12-23: 15 [IU] via SUBCUTANEOUS

## 2016-12-11 MED ORDER — SODIUM CHLORIDE 0.9 % IV BOLUS (SEPSIS)
1000.0000 mL | Freq: Once | INTRAVENOUS | Status: AC
  Administered 2016-12-11: 1000 mL via INTRAVENOUS

## 2016-12-11 MED ORDER — SODIUM CHLORIDE 0.9 % IV SOLN
INTRAVENOUS | Status: DC
  Administered 2016-12-11: 2.2 [IU]/h via INTRAVENOUS
  Filled 2016-12-11: qty 2.5

## 2016-12-11 MED ORDER — SODIUM CHLORIDE 0.9 % IV SOLN
INTRAVENOUS | Status: DC
  Filled 2016-12-11: qty 2.5

## 2016-12-11 MED ORDER — ONDANSETRON HCL 4 MG/2ML IJ SOLN
4.0000 mg | Freq: Four times a day (QID) | INTRAMUSCULAR | Status: DC | PRN
  Administered 2016-12-11 – 2016-12-16 (×11): 4 mg via INTRAVENOUS
  Filled 2016-12-11 (×12): qty 2

## 2016-12-11 NOTE — Progress Notes (Addendum)
Patient seen and evaluated earlier this am by my associate. Please refer to H and P for details regarding assessment and plan.  Pt is a 80 y/o that presented with DKA and positive influenza A.   I have started on Tamiflu.  Will reassess next am.  Gen: Pt in nad, alert and awake CV: no cyanosis Pulm: equal chest rise, no wheezes  Dylan Quinn   Addendum: Pt has met target blood sugar goals as per page from nurse. Will transition to SQ insulin regimen.  [name], [employer]

## 2016-12-11 NOTE — Progress Notes (Signed)
Inpatient Diabetes Program Recommendations  AACE/ADA: New Consensus Statement on Inpatient Glycemic Control (2015)  Target Ranges:  Prepandial:   less than 140 mg/dL      Peak postprandial:   less than 180 mg/dL (1-2 hours)      Critically ill patients:  140 - 180 mg/dL   Lab Results  Component Value Date   GLUCAP 144 (H) 12/11/2016   HGBA1C 10.4 10/09/2016    Review of Glycemic Control  Diabetes history: DM1 Outpatient Diabetes medications: Lantus 22 units QD, Humalog 7-15 units tidwc Current orders for Inpatient glycemic control: IV insulin per DKA protocol.  Not ready to transition off drip.  Inpatient Diabetes Program Recommendations:    Updated HgbA1C Transition to Lantus 15 units QD. Give 2 hours prior to d/c of drip Novolog sensitive tidwc and hs Novolog 5 units tidwc for meal coverage insulin  Will follow. Thank you. Lorenda Peck, RD, LDN, CDE Inpatient Diabetes Coordinator [phone]

## 2016-12-11 NOTE — ED Notes (Signed)
Pt wife states that patient has been eating x2 days and had one episode of vomiting and BS ranging in the 500. Wife states she gave her husband 8 units of flexipen and brought  cbg down to 335.; Pt was recently dx with the flu and has him generally weak.

## 2016-12-11 NOTE — Telephone Encounter (Signed)
From what I see, he still in the hospital and they are adjusting his insulins.  His sugars are probably so high because of his flu. We'll need to see how he is doing after discharge. I still strongly suggest an insulin pump.

## 2016-12-11 NOTE — H&P (Signed)
History and Physical    Dylan Quinn [id number] DOB: 1936/08/02 DOA: 12/11/2016  PCP: Tracie Harrier, MD  Patient coming from: Home.  Chief Complaint: Nausea vomiting. Elevated blood sugar.  HPI: Dylan Quinn is a 80 y.o. male with history of diabetes mellitus type 1, hypertension was diagnosed with influenza 2 days ago presents to the ER because of persistent nausea vomiting and elevated blood sugar. Patient states he has been having upper respiratory tract symptoms since Wednesday 5 days ago. Last 2 days he has been having persistent nausea and vomiting and tested positive for influenza. Patient was not started on Tamiflu since patient's symptoms were present for more than 48 hours. Patient's primary care physician started patient on Zithromax yesterday and patient took 2 of the pills initially but threw up. Since patient has been having persistent nausea and vomiting and elevated blood sugar was instructed to come to the ER. In the ER patient was found to be having low bicarbonate with elevated anion gap and has been admitted for diabetic ketoacidosis and dehydration. Patient denies any chest pain or shortness of breath. Abdomen appears benign.   ED Course: Was started on fluid bolus 2 L and IV insulin infusion.  Review of Systems: As per HPI, rest all negative.   Past Medical History:  Diagnosis Date  . BENIGN PROSTATIC HYPERTROPHY 07/19/2009  . COLONIC POLYPS 02/14/2010  . DEPRESSION 03/16/2008  . DIABETES MELLITUS, TYPE I 06/30/2007  . Dyslipidemia   . FOLLICULITIS 0000000  . GLAUCOMA 07/19/2009  . HEARING LOSS 02/14/2010  . HYPERTENSION 05/14/2008  . HYPOGONADISM, MALE 12/16/2007  . Leukopenia   . LUMBAR RADICULOPATHY, LEFT 02/23/2010  . Other osteoporosis 12/16/2007  . PERIPHERAL NEUROPATHY 06/30/2007  . Prostate cancer ([place])   . PROTEINURIA, MILD 02/14/2010  . Psoriasis   . URINARY CALCULUS 12/27/2010    Past Surgical History:  Procedure Laterality Date  .  APPENDECTOMY  1986  . CATARACT EXTRACTION  2006  . CATARACT EXTRACTION  1997     reports that he has never smoked. He has never used smokeless tobacco. He reports that he does not drink alcohol or use drugs.  Allergies  Allergen Reactions  . Lisinopril Hives    hives    Family History  Problem Relation Age of Onset  . Cancer Father     Colon Cancer  . Heart disease Father   . High blood pressure      Prior to Admission medications   Medication Sig Start Date End Date Taking? Authorizing Provider  aspirin (GOODSENSE ASPIRIN) 81 MG chewable tablet Chew 81 mg by mouth daily.    Yes Historical Provider, MD  brimonidine-timolol (COMBIGAN) 0.2-0.5 % ophthalmic solution Place 1 drop into both eyes 2 (two) times daily.    Yes Historical Provider, MD  clobetasol cream (TEMOVATE) [id number] % Apply 1 application topically 2 (two) times daily as needed (for rash).    Yes Historical Provider, MD  dorzolamide (TRUSOPT) 2 % ophthalmic solution Place 1 drop into both eyes 2 (two) times daily.   Yes Historical Provider, MD  glucagon (GLUCAGON EMERGENCY) 1 MG injection Inject 1 mg into the muscle once as needed. 09/28/14  Yes Philemon Kingdom, MD  hydrochlorothiazide (HYDRODIURIL) 25 MG tablet Take 1 tablet (25 mg total) by mouth daily. 12/12/15  Yes Janith Lima, MD  Insulin Glargine (LANTUS) 100 UNIT/ML Solostar Pen Inject 100 Units into the skin daily at 10 pm. Patient taking differently: Inject 22 Units into the skin daily.  11/27/16  Yes Philemon Kingdom, MD  insulin lispro (HUMALOG) 100 UNIT/ML injection Inject 7-15 Units into the skin 3 (three) times daily with meals. Per sliding scale   Yes Historical Provider, MD  latanoprost (XALATAN) 0.005 % ophthalmic solution Place 1 drop into both eyes at bedtime.   Yes Historical Provider, MD  olmesartan (BENICAR) 40 MG tablet Take 1 tablet (40 mg total) by mouth daily. 12/12/15  Yes Janith Lima, MD  pregabalin (LYRICA) 100 MG capsule Take 1 capsule (100  mg total) by mouth 2 (two) times daily. 12/12/15  Yes Janith Lima, MD  traMADol (ULTRAM) 50 MG tablet Take 50 mg by mouth every 6 (six) hours as needed for moderate pain or severe pain.    Yes Historical Provider, MD  HYDROcodone-homatropine (HYCODAN) 5-1.5 MG/5ML syrup Take 5 mLs by mouth every 6 (six) hours as needed for cough. 12/09/16   Hinda Kehr, MD  insulin glargine (LANTUS) 100 unit/mL SOPN Inject 1 mL (100 Units total) into the skin at bedtime. Patient not taking: Reported on 12/11/2016 11/23/16   Philemon Kingdom, MD    Physical Exam: Vitals:   12/11/16 0200 12/11/16 0214 12/11/16 0300 12/11/16 0400  BP: 147/78 147/78 164/89 167/76  Pulse: 87 88 92 87  Resp:  18 22 16   Temp:  98.7 F (37.1 C)    TempSrc:  Oral    SpO2: 99% 97% 99% 97%  Weight:      Height:          Constitutional: Moderately built and nourished. Vitals:   12/11/16 0200 12/11/16 0214 12/11/16 0300 12/11/16 0400  BP: 147/78 147/78 164/89 167/76  Pulse: 87 88 92 87  Resp:  18 22 16   Temp:  98.7 F (37.1 C)    TempSrc:  Oral    SpO2: 99% 97% 99% 97%  Weight:      Height:       Eyes: Anicteric no pallor. ENMT: No discharge from the ears eyes nose and mouth. Neck: No mass felt. No neck rigidity. Respiratory: No rhonchi or crepitations. Cardiovascular: S1-S2 heard. Abdomen: Soft nontender bowel sounds present. No guarding or rigidity. Musculoskeletal: No edema. No joint effusion. Skin: No rash. Skin appears warm. Neurologic: Alert awake oriented to time place and person. Moves all extremities. Psychiatric: Appears normal. Normal affect.   Labs on Admission: I have personally reviewed following labs and imaging studies  CBC:  Recent Labs Lab 12/09/16 1603 12/11/16 0235  WBC 6.9 7.0  HGB 12.8* 13.0  HCT 37.7* 39.1  MCV 92.7 90.7  PLT 177 [id number]   Basic Metabolic Panel:  Recent Labs Lab 12/09/16 1603 12/11/16 0235  NA 133* 134*  K 3.6 3.8  CL 99* 101  CO2 21* 17*  GLUCOSE 350*  295*  BUN 12 20  CREATININE 0.95 1.15  CALCIUM 9.0 9.1   GFR: Estimated Creatinine Clearance: 64.1 mL/min (by C-G formula based on SCr of 1.15 mg/dL). Liver Function Tests:  Recent Labs Lab 12/09/16 1603 12/11/16 0235  AST 15 21  ALT 10* 14*  ALKPHOS 126 130*  BILITOT 0.9 0.8  PROT 6.7 7.3  ALBUMIN 3.8 3.9    Recent Labs Lab 12/09/16 1603 12/11/16 0235  LIPASE <10* 18   No results for input(s): AMMONIA in the last 168 hours. Coagulation Profile: No results for input(s): INR, PROTIME in the last 168 hours. Cardiac Enzymes:  Recent Labs Lab 12/09/16 1603  TROPONINI <0.03   BNP (last 3 results) No results for input(s): PROBNP  in the last 8760 hours. HbA1C: No results for input(s): HGBA1C in the last 72 hours. CBG:  Recent Labs Lab 12/09/16 2116 12/11/16 0154 12/11/16 0357 12/11/16 0435  GLUCAP 336* 280* 269* 275*   Lipid Profile: No results for input(s): CHOL, HDL, LDLCALC, TRIG, CHOLHDL, LDLDIRECT in the last 72 hours. Thyroid Function Tests: No results for input(s): TSH, T4TOTAL, FREET4, T3FREE, THYROIDAB in the last 72 hours. Anemia Panel: No results for input(s): VITAMINB12, FOLATE, FERRITIN, TIBC, IRON, RETICCTPCT in the last 72 hours. Urine analysis:    Component Value Date/Time   COLORURINE YELLOW 12/11/2016 0259   APPEARANCEUR CLEAR 12/11/2016 0259   LABSPEC 1.022 12/11/2016 0259   PHURINE 5.0 12/11/2016 0259   GLUCOSEU >=500 (A) 12/11/2016 0259   GLUCOSEU >=1000 (A) 12/12/2015 1654   HGBUR MODERATE (A) 12/11/2016 0259   BILIRUBINUR NEGATIVE 12/11/2016 0259   KETONESUR 80 (A) 12/11/2016 0259   PROTEINUR 100 (A) 12/11/2016 0259   UROBILINOGEN 0.2 12/12/2015 1654   NITRITE NEGATIVE 12/11/2016 0259   LEUKOCYTESUR NEGATIVE 12/11/2016 0259   Sepsis Labs: @LABRCNTIP (procalcitonin:4,lacticidven:4) )No results found for this or any previous visit (from the past 240 hour(s)).   Radiological Exams on Admission: Dg Chest 2 View  Result Date:  12/09/2016 CLINICAL DATA:  Cough, body aches, malaise X 3 days. Non smoker. diabetic EXAM: CHEST  2 VIEW COMPARISON:  12/21/2015 FINDINGS: Heart size is normal. Aorta is mildly tortuous. There is stable elevation of left hemidiaphragm. Mildly prominent interstitial markings are stable. There are no focal consolidations or pleural effusions. No pulmonary edema. IMPRESSION: No evidence for acute cardiopulmonary abnormality. Electronically Signed   By: Nolon Nations M.D.   On: 12/09/2016 17:44    Assessment/Plan Principal Problem:   Diabetic ketoacidosis without coma associated with type 1 diabetes mellitus ([place]) Active Problems:   Essential hypertension   URI (upper respiratory infection)   Nausea & vomiting   DKA, type 1 ([place])    1. Diabetic ketoacidosis in type I - probably precipitated by recent flu infection. Patient states his last hemoglobin A1c was around 9. Patient has been started on IV fluids and insulin infusion. Once patient's anion gap gets corrected change to subcutaneous Lantus. Closely follow metabolic panel. 2. Nausea vomiting probably secondary to viral infection or gastroparesis. Abdomen appears benign. Closely observe. 3. Upper respiratory tract infection with recent diagnosis of influenza - chest x-rays pending. Influenza PCR is pending. 4. Hypertension - hold diuretics but continue ARB.  Chest x-ray is pending.   DVT prophylaxis: Lovenox. Code Status: Full code.  Family Communication: Patient's wife.  Disposition Plan: Home.  Consults called: None.  Admission status: Observation.    Rise Patience MD [facility] Pager [phone].  If 7PM-7AM, please contact night-coverage [email or web address] Password [facility]  12/11/2016, 4:43 AM

## 2016-12-11 NOTE — Telephone Encounter (Signed)
Spoke to spouse. She confirmed patient is hospitalized. Dx(d) w/ ketoacidosis. Gave instructions per Dr. Arman Filter previous note. Spouse verbalized understanding.

## 2016-12-11 NOTE — ED Triage Notes (Signed)
PT from EMS from home for evaluation of hyperglycemia and emesis. Pt reported symptoms occurring on Wed and was DX with Influenza A at [facility] on 12/09/16 and evaluated by PCP on 12/10/16 and given Erythromycin. Pt reports relief with nausea and vomiting after receiving IV Zofran by EMS.

## 2016-12-11 NOTE — ED Provider Notes (Signed)
[facility] DEPT Provider Note   CSN: [id number] Arrival date & time: 12/11/16  0148  History   Chief Complaint Chief Complaint  Patient presents with  . Hyperglycemia  . Emesis    HPI Dylan Quinn is a 80 y.o. male.  HPI  80 y.o. male with a hx of DM Type I, HTN, HLD presents to the Emergency Department today complaining of hyperglycemia, emesis x several days. Pt states that he was recently seen at PCP and diagnosed with Flu on 12-09-16. Did not improve and went to [facility] and given Azithromycin on 12-10-16. Presents to day for same. N/V today at home. Called Endocrinologist and told to come to ED. Has hx DKA. Unable to keep fluids down. No pain currently. Pt is IDDM. No fevers. Notes cough and congestion. No other symptoms noted.   Past Medical History:  Diagnosis Date  . BENIGN PROSTATIC HYPERTROPHY 07/19/2009  . COLONIC POLYPS 02/14/2010  . DEPRESSION 03/16/2008  . DIABETES MELLITUS, TYPE I 06/30/2007  . Dyslipidemia   . FOLLICULITIS 0000000  . GLAUCOMA 07/19/2009  . HEARING LOSS 02/14/2010  . HYPERTENSION 05/14/2008  . HYPOGONADISM, MALE 12/16/2007  . Leukopenia   . LUMBAR RADICULOPATHY, LEFT 02/23/2010  . Other osteoporosis 12/16/2007  . PERIPHERAL NEUROPATHY 06/30/2007  . Prostate cancer ([place])   . PROTEINURIA, MILD 02/14/2010  . Psoriasis   . URINARY CALCULUS 12/27/2010    Patient Active Problem List   Diagnosis Date Noted  . Right knee pain 04/18/2016  . Primary osteoarthritis of right knee 04/18/2016  . COPD (chronic obstructive pulmonary disease) ([place]) 12/22/2015  . 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 URI (upper respiratory infection) 12/09/2015  . Normocytic anemia 05/04/2013  . Insomnia 07/28/2012  . HEARING LOSS 02/14/2010  . PROTEINURIA, MILD 02/14/2010  . GLAUCOMA 07/19/2009  . Prostate cancer ([place]) 07/19/2009  . Essential hypertension 05/14/2008  . Other osteoporosis 12/16/2007  . Diabetes mellitus type 1 with  neurological manifestations ([place]) 06/30/2007  . PERIPHERAL NEUROPATHY 06/30/2007    Past Surgical History:  Procedure Laterality Date  . APPENDECTOMY  1986  . CATARACT EXTRACTION  2006  . CATARACT EXTRACTION  1997       Home Medications    Prior to Admission medications   Medication Sig Start Date End Date Taking? Authorizing Provider  aspirin (GOODSENSE ASPIRIN) 81 MG chewable tablet Chew by mouth.    Historical Provider, MD  brimonidine-timolol (COMBIGAN) 0.2-0.5 % ophthalmic solution Place 1 drop into both eyes 2 (two) times daily.     Historical Provider, MD  clobetasol cream (TEMOVATE) [id number] % Apply 1 application topically 2 (two) times daily as needed (for rash).     Historical Provider, MD  colchicine 0.6 MG tablet Reported on 06/13/2016 03/14/16   Historical Provider, MD  dorzolamide (TRUSOPT) 2 % ophthalmic solution Place 1 drop into both eyes 2 (two) times daily.    Historical Provider, MD  glucagon (GLUCAGON EMERGENCY) 1 MG injection Inject 1 mg into the muscle once as needed. Patient not taking: Reported on 10/09/2016 09/28/14   Philemon Kingdom, MD  hydrochlorothiazide (HYDRODIURIL) 25 MG tablet Take 1 tablet (25 mg total) by mouth daily. 12/12/15   Janith Lima, MD  HYDROcodone-homatropine [facility]) 5-1.5 MG/5ML syrup Take 5 mLs by mouth every 6 (six) hours as needed for cough. 12/09/16   Hinda Kehr, MD  Insulin Glargine (LANTUS) 100 UNIT/ML Solostar Pen Inject 100 Units into the skin daily at 10 pm. 11/27/16   Philemon Kingdom,  MD  insulin glargine (LANTUS) 100 unit/mL SOPN Inject 1 mL (100 Units total) into the skin at bedtime. 11/23/16   Philemon Kingdom, MD  insulin lispro (HUMALOG) 100 UNIT/ML injection Inject 7-15 Units into the skin 3 (three) times daily with meals.    Historical Provider, MD  latanoprost (XALATAN) 0.005 % ophthalmic solution Place 1 drop into both eyes at bedtime.    Historical Provider, MD  olmesartan (BENICAR) 40 MG tablet Take 1 tablet (40 mg  total) by mouth daily. 12/12/15   Janith Lima, MD  omega-3 acid ethyl esters (LOVAZA) 1 g capsule Take by mouth.    Historical Provider, MD  pregabalin (LYRICA) 100 MG capsule Take 1 capsule (100 mg total) by mouth 2 (two) times daily. 12/12/15   Janith Lima, MD  traMADol (ULTRAM) 50 MG tablet Take by mouth every 6 (six) hours as needed.    Historical Provider, MD    Family History Family History  Problem Relation Age of Onset  . Cancer Father     Colon Cancer  . Heart disease Father   . High blood pressure      Social History Social History  Substance Use Topics  . Smoking status: Never Smoker  . Smokeless tobacco: Never Used  . Alcohol use No     Comment: rare     Allergies   Lisinopril   Review of Systems Review of Systems ROS reviewed and all are negative for acute change except as noted in the HPI  Physical Exam Updated Vital Signs BP 147/78 (BP Location: Left Arm)   Pulse 88   Temp 98.7 F (37.1 C) (Oral)   Resp 18   Ht 6' (1.829 m)   Wt 97.5 kg   SpO2 97%   BMI 29.16 kg/m   Physical Exam  Constitutional: He is oriented to person, place, and time. Vital signs are normal. He appears well-developed and well-nourished.  HENT:  Head: Normocephalic and atraumatic.  Right Ear: Hearing normal.  Left Ear: Hearing normal.  Eyes: Conjunctivae and EOM are normal. Pupils are equal, round, and reactive to light.  Neck: Normal range of motion. Neck supple.  Cardiovascular: Normal rate, regular rhythm, normal heart sounds and intact distal pulses.   Pulmonary/Chest: Effort normal and breath sounds normal.  Abdominal: Soft.  Musculoskeletal: Normal range of motion.  Neurological: He is alert and oriented to person, place, and time.  Skin: Skin is warm and dry.  Psychiatric: He has a normal mood and affect. His speech is normal and behavior is normal. Thought content normal.  Nursing note and vitals reviewed.  ED Treatments / Results  Labs (all labs ordered  are listed, but only abnormal results are displayed) Labs Reviewed  COMPREHENSIVE METABOLIC PANEL - Abnormal; Notable for the following:       Result Value   Sodium 134 (*)    CO2 17 (*)    Glucose, Bld 295 (*)    ALT 14 (*)    Alkaline Phosphatase 130 (*)    GFR calc non Af Amer 59 (*)    Anion gap 16 (*)    All other components within normal limits  URINALYSIS, ROUTINE W REFLEX MICROSCOPIC - Abnormal; Notable for the following:    Glucose, UA >=500 (*)    Hgb urine dipstick MODERATE (*)    Ketones, ur 80 (*)    Protein, ur 100 (*)    Squamous Epithelial / LPF 0-5 (*)    All other components within normal  limits  CBG MONITORING, ED - Abnormal; Notable for the following:    Glucose-Capillary 280 (*)    All other components within normal limits  CBC  LIPASE, BLOOD    EKG  EKG Interpretation None      Radiology Dg Chest 2 View  Result Date: 12/09/2016 CLINICAL DATA:  Cough, body aches, malaise X 3 days. Non smoker. diabetic EXAM: CHEST  2 VIEW COMPARISON:  12/21/2015 FINDINGS: Heart size is normal. Aorta is mildly tortuous. There is stable elevation of left hemidiaphragm. Mildly prominent interstitial markings are stable. There are no focal consolidations or pleural effusions. No pulmonary edema. IMPRESSION: No evidence for acute cardiopulmonary abnormality. Electronically Signed   By: Nolon Nations M.D.   On: 12/09/2016 17:44    Procedures Procedures (including critical care time) CRITICAL CARE Performed by: Ozella Rocks   Total critical care time: 35 minutes  Critical care time was exclusive of separately billable procedures and treating other patients.  Critical care was necessary to treat or prevent imminent or life-threatening deterioration.  Critical care was time spent personally by me on the following activities: development of treatment plan with patient and/or surrogate as well as nursing, discussions with consultants, evaluation of patient's response to  treatment, examination of patient, obtaining history from patient or surrogate, ordering and performing treatments and interventions, ordering and review of laboratory studies, ordering and review of radiographic studies, pulse oximetry and re-evaluation of patient's condition.   Medications Ordered in ED Medications  sodium chloride 0.9 % bolus 1,000 mL (1,000 mLs Intravenous New Bag/Given 12/11/16 0200)     Initial Impression / Assessment and Plan / ED Course  I have reviewed the triage vital signs and the nursing notes.  Pertinent labs & imaging results that were available during my care of the patient were reviewed by me and considered in my medical decision making (see chart for details).  Clinical Course    Final Clinical Impressions(s) / ED Diagnoses  {I have reviewed and evaluated the relevant laboratory values.   {I have reviewed the relevant previous healthcare records.  {I obtained HPI from historian. {Patient discussed with supervising physician.  ED Course:  Assessment: Pt is a 56yM with hx IDDM who presents with hyperglycemia and emesis. URI symptoms x several days. Flu vs Infection based on previous ED visits. On exam, pt in NAD. Nontoxic/nonseptic appearing. VSS. Afebrile. Lungs CTA. Heart RRR. Abdomen nontender soft. POC Glucose 280. CBC unremarkable. Potassium 3.8. Gap 16. UA with 80 ketones. Given NS bolus 1L in ED as well as Insulin gtt. Plan is to Admit for DKA.   Disposition/Plan:  Admit Pt acknowledges and agrees with plan  Supervising Physician Delora Fuel, MD  Final diagnoses:  Diabetic ketoacidosis without coma associated with type 1 diabetes mellitus [facility])    New Prescriptions New Prescriptions   No medications on file     Shary Decamp, PA-C [id number]    Delora Fuel, MD [id number]

## 2016-12-11 NOTE — Telephone Encounter (Signed)
Patient was in th ER, sick, headache and not eation much. Was given IV fluids, b/s was 493,given a flexpen at 400 it was good. During the day f/u was. 500. Please advise

## 2016-12-12 DIAGNOSIS — E1010 Type 1 diabetes mellitus with ketoacidosis without coma: Secondary | ICD-10-CM | POA: Diagnosis not present

## 2016-12-12 LAB — GLUCOSE, CAPILLARY
GLUCOSE-CAPILLARY: 287 mg/dL — AB (ref 65–99)
GLUCOSE-CAPILLARY: 215 mg/dL — AB (ref 65–99)
GLUCOSE-CAPILLARY: 250 mg/dL — AB (ref 65–99)
Glucose-Capillary: 378 mg/dL — ABNORMAL HIGH (ref 65–99)

## 2016-12-12 MED ORDER — POTASSIUM CHLORIDE 20 MEQ PO PACK
40.0000 meq | PACK | Freq: Once | ORAL | Status: DC
  Filled 2016-12-12: qty 2

## 2016-12-12 MED ORDER — POTASSIUM CHLORIDE CRYS ER 20 MEQ PO TBCR
40.0000 meq | EXTENDED_RELEASE_TABLET | Freq: Once | ORAL | Status: AC
  Administered 2016-12-12: 40 meq via ORAL
  Filled 2016-12-12: qty 2

## 2016-12-12 MED ORDER — HYDROCHLOROTHIAZIDE 25 MG PO TABS
25.0000 mg | ORAL_TABLET | Freq: Every day | ORAL | Status: DC
  Administered 2016-12-12 – 2016-12-16 (×5): 25 mg via ORAL
  Filled 2016-12-12 (×5): qty 1

## 2016-12-12 MED ORDER — HYDRALAZINE HCL 20 MG/ML IJ SOLN
10.0000 mg | Freq: Once | INTRAMUSCULAR | Status: AC
  Administered 2016-12-12: 10 mg via INTRAVENOUS
  Filled 2016-12-12: qty 1

## 2016-12-12 NOTE — Progress Notes (Signed)
PROGRESS NOTE    Dylan Quinn  [id number] DOB: 08-13-1936 DOA: 12/11/2016 PCP: Tracie Harrier, MD    Brief Narrative: 80 y/o with history of HTN and DM who presented to the hospital with + influenza A and DKA.   Assessment & Plan:   Principal Problem:   Diabetic ketoacidosis without coma associated with type 1 diabetes mellitus ([place]) - successfully transitioned to subcutaneous insulin - Diabetic diet - Transition to floor and improvement in condition - Antibiotic when necessary nausea has been resolving  Hypokalemia - Replace orally - Reassess next a.m.  Active Problems:   Essential hypertension - Continue ARB and will add HCTZ  Influenza A - Continue Tamiflu and supportive therapy    Nausea & vomiting - secondary to principle problem most likely   DVT prophylaxis: Lovenox Code Status: Full Family Communication: none at bedside Disposition Plan: floor with PT eval   Consultants:   None   Procedures: None   Antimicrobials: oseltamivir   Subjective: Pt has no new complaints. No acute issues overnight.   Objective: Vitals:   12/12/16 0330 12/12/16 0400 12/12/16 0439 12/12/16 0500  BP: (!) 167/54 (!) 160/73  (!) 132/54  Pulse: 85 88  88  Resp: 19 17  (!) 21  Temp:   99.5 F (37.5 C)   TempSrc:   Oral   SpO2: 96% 97%  97%  Weight:      Height:        Intake/Output Summary (Last 24 hours) at 12/12/16 0723 Last data filed at 12/12/16 [id number]  Gross per 24 hour  Intake           2707.8 ml  Output             2710 ml  Net             -2.2 ml   Filed Weights   12/11/16 0159  Weight: 97.5 kg (215 lb)    Examination:  General exam: Appears calm and in NAD. Respiratory system: Clear to auscultation. Respiratory effort normal. Cardiovascular system: S1 & S2 heard, RRR. No JVD, murmurs, rubs, gallops or clicks. No pedal edema. Gastrointestinal system: Abdomen is nondistended, soft and nontender. No organomegaly or masses felt. Normal bowel  sounds heard. Central nervous system: Alert and oriented. No focal neurological deficits. Extremities: Symmetric 5 x 5 power. Skin: No rashes, lesions or ulcers, on limited exam. Psychiatry: Judgement and insight appear normal. Mood & affect appropriate.   Data Reviewed: I have personally reviewed following labs and imaging studies  CBC:  Recent Labs Lab 12/09/16 1603 12/11/16 0235 12/11/16 0802  WBC 6.9 7.0 6.6  HGB 12.8* 13.0 11.5*  HCT 37.7* 39.1 32.9*  MCV 92.7 90.7 88.2  PLT 177 194 [id number]   Basic Metabolic Panel:  Recent Labs Lab 12/09/16 1603 12/11/16 0235 12/11/16 0802 12/11/16 1328  NA 133* 134* 138 137  K 3.6 3.8 3.3* 3.2*  CL 99* 101 109 106  CO2 21* 17* 20* 21*  GLUCOSE 350* 295* 228* 148*  BUN 12 20 15 12   CREATININE 0.95 1.15 1.13 0.94  CALCIUM 9.0 9.1 8.4* 8.5*   GFR: Estimated Creatinine Clearance: 78.4 mL/min (by C-G formula based on SCr of 0.94 mg/dL). Liver Function Tests:  Recent Labs Lab 12/09/16 1603 12/11/16 0235  AST 15 21  ALT 10* 14*  ALKPHOS 126 130*  BILITOT 0.9 0.8  PROT 6.7 7.3  ALBUMIN 3.8 3.9    Recent Labs Lab 12/09/16 1603 12/11/16 0235  LIPASE <10* 18  No results for input(s): AMMONIA in the last 168 hours. Coagulation Profile: No results for input(s): INR, PROTIME in the last 168 hours. Cardiac Enzymes:  Recent Labs Lab 12/09/16 1603 12/11/16 0600 12/11/16 0802  TROPONINI <0.03 QUESTIONABLE RESULTS, RECOMMEND RECOLLECT TO VERIFY 0.04*   BNP (last 3 results) No results for input(s): PROBNP in the last 8760 hours. HbA1C: No results for input(s): HGBA1C in the last 72 hours. CBG:  Recent Labs Lab 12/11/16 1134 12/11/16 1233 12/11/16 1356 12/11/16 1545 12/11/16 2102  GLUCAP 129* 132* 172* 156* 266*   Lipid Profile: No results for input(s): CHOL, HDL, LDLCALC, TRIG, CHOLHDL, LDLDIRECT in the last 72 hours. Thyroid Function Tests: No results for input(s): TSH, T4TOTAL, FREET4, T3FREE, THYROIDAB in  the last 72 hours. Anemia Panel: No results for input(s): VITAMINB12, FOLATE, FERRITIN, TIBC, IRON, RETICCTPCT in the last 72 hours. Sepsis Labs: No results for input(s): PROCALCITON, LATICACIDVEN in the last 168 hours.  Recent Results (from the past 240 hour(s))  MRSA PCR Screening     Status: None   Collection Time: 12/11/16  5:29 AM  Result Value Ref Range Status   MRSA by PCR NEGATIVE NEGATIVE Final    Comment:        The GeneXpert MRSA Assay (FDA approved for NASAL specimens only), is one component of a comprehensive MRSA colonization surveillance program. It is not intended to diagnose MRSA infection nor to guide or monitor treatment for MRSA infections.          Radiology Studies: Dg Chest Port 1 View  Result Date: 12/11/2016 CLINICAL DATA:  Cough and body aches worsening this morning. EXAM: PORTABLE CHEST 1 VIEW COMPARISON:  PA and lateral chest x-ray of December 09, 2016 FINDINGS: The left hemidiaphragm remains elevated. The lungs are adequately inflated and clear. The heart and pulmonary vascularity are normal. The mediastinum is normal in width. There is calcification in the wall of the aortic arch. The bony thorax exhibits no acute abnormality. IMPRESSION: No pneumonia nor CHF. Persistent elevation of the left hemidiaphragm. Thoracic aortic atherosclerosis. Electronically Signed   By: David  Martinique M.D.   On: 12/11/2016 08:07        Scheduled Meds: . brimonidine  1 drop Both Eyes BID   And  . timolol  1 drop Both Eyes BID  . dorzolamide  1 drop Both Eyes BID  . enoxaparin (LOVENOX) injection  40 mg Subcutaneous Q24H  . hydrochlorothiazide  25 mg Oral Daily  . insulin aspart  0-15 Units Subcutaneous TID WC  . insulin aspart  0-5 Units Subcutaneous QHS  . insulin glargine  22 Units Subcutaneous Daily  . irbesartan  300 mg Oral Daily  . latanoprost  1 drop Both Eyes QHS  . oseltamivir  75 mg Oral BID  . potassium chloride  40 mEq Oral Once  . pregabalin   100 mg Oral BID   Continuous Infusions:   LOS: 0 days    Time spent: > 35 minutes  Velvet Bathe, MD [facility] Pager [phone]  If 7PM-7AM, please contact night-coverage [email or web address] Password [email or web address] 12/12/2016, 7:23 AM

## 2016-12-13 DIAGNOSIS — J189 Pneumonia, unspecified organism: Secondary | ICD-10-CM | POA: Diagnosis not present

## 2016-12-13 DIAGNOSIS — Z794 Long term (current) use of insulin: Secondary | ICD-10-CM | POA: Diagnosis not present

## 2016-12-13 DIAGNOSIS — J101 Influenza due to other identified influenza virus with other respiratory manifestations: Secondary | ICD-10-CM | POA: Diagnosis present

## 2016-12-13 DIAGNOSIS — E1043 Type 1 diabetes mellitus with diabetic autonomic (poly)neuropathy: Secondary | ICD-10-CM | POA: Diagnosis present

## 2016-12-13 DIAGNOSIS — E1310 Other specified diabetes mellitus with ketoacidosis without coma: Secondary | ICD-10-CM | POA: Diagnosis not present

## 2016-12-13 DIAGNOSIS — E119 Type 2 diabetes mellitus without complications: Secondary | ICD-10-CM | POA: Diagnosis not present

## 2016-12-13 DIAGNOSIS — Z9841 Cataract extraction status, right eye: Secondary | ICD-10-CM | POA: Diagnosis not present

## 2016-12-13 DIAGNOSIS — J1100 Influenza due to unidentified influenza virus with unspecified type of pneumonia: Secondary | ICD-10-CM | POA: Diagnosis not present

## 2016-12-13 DIAGNOSIS — R531 Weakness: Secondary | ICD-10-CM | POA: Diagnosis not present

## 2016-12-13 DIAGNOSIS — K5900 Constipation, unspecified: Secondary | ICD-10-CM | POA: Diagnosis not present

## 2016-12-13 DIAGNOSIS — Q211 Atrial septal defect: Secondary | ICD-10-CM | POA: Diagnosis not present

## 2016-12-13 DIAGNOSIS — Z888 Allergy status to other drugs, medicaments and biological substances status: Secondary | ICD-10-CM | POA: Diagnosis not present

## 2016-12-13 DIAGNOSIS — Z7982 Long term (current) use of aspirin: Secondary | ICD-10-CM | POA: Diagnosis not present

## 2016-12-13 DIAGNOSIS — E876 Hypokalemia: Secondary | ICD-10-CM | POA: Diagnosis present

## 2016-12-13 DIAGNOSIS — J111 Influenza due to unidentified influenza virus with other respiratory manifestations: Secondary | ICD-10-CM | POA: Diagnosis not present

## 2016-12-13 DIAGNOSIS — R05 Cough: Secondary | ICD-10-CM | POA: Diagnosis not present

## 2016-12-13 DIAGNOSIS — Z79899 Other long term (current) drug therapy: Secondary | ICD-10-CM | POA: Diagnosis not present

## 2016-12-13 DIAGNOSIS — M6281 Muscle weakness (generalized): Secondary | ICD-10-CM | POA: Diagnosis not present

## 2016-12-13 DIAGNOSIS — R112 Nausea with vomiting, unspecified: Secondary | ICD-10-CM | POA: Diagnosis not present

## 2016-12-13 DIAGNOSIS — E785 Hyperlipidemia, unspecified: Secondary | ICD-10-CM | POA: Diagnosis present

## 2016-12-13 DIAGNOSIS — R7881 Bacteremia: Secondary | ICD-10-CM | POA: Diagnosis not present

## 2016-12-13 DIAGNOSIS — R042 Hemoptysis: Secondary | ICD-10-CM | POA: Diagnosis not present

## 2016-12-13 DIAGNOSIS — M810 Age-related osteoporosis without current pathological fracture: Secondary | ICD-10-CM | POA: Diagnosis present

## 2016-12-13 DIAGNOSIS — Z9049 Acquired absence of other specified parts of digestive tract: Secondary | ICD-10-CM | POA: Diagnosis not present

## 2016-12-13 DIAGNOSIS — I10 Essential (primary) hypertension: Secondary | ICD-10-CM | POA: Diagnosis not present

## 2016-12-13 DIAGNOSIS — H409 Unspecified glaucoma: Secondary | ICD-10-CM | POA: Diagnosis present

## 2016-12-13 DIAGNOSIS — B9562 Methicillin resistant Staphylococcus aureus infection as the cause of diseases classified elsewhere: Secondary | ICD-10-CM | POA: Diagnosis present

## 2016-12-13 DIAGNOSIS — Z9842 Cataract extraction status, left eye: Secondary | ICD-10-CM | POA: Diagnosis not present

## 2016-12-13 DIAGNOSIS — R0602 Shortness of breath: Secondary | ICD-10-CM | POA: Diagnosis not present

## 2016-12-13 DIAGNOSIS — R2681 Unsteadiness on feet: Secondary | ICD-10-CM | POA: Diagnosis not present

## 2016-12-13 DIAGNOSIS — H9190 Unspecified hearing loss, unspecified ear: Secondary | ICD-10-CM | POA: Diagnosis present

## 2016-12-13 DIAGNOSIS — Z8546 Personal history of malignant neoplasm of prostate: Secondary | ICD-10-CM | POA: Diagnosis not present

## 2016-12-13 DIAGNOSIS — E1010 Type 1 diabetes mellitus with ketoacidosis without coma: Secondary | ICD-10-CM | POA: Diagnosis not present

## 2016-12-13 DIAGNOSIS — R079 Chest pain, unspecified: Secondary | ICD-10-CM | POA: Diagnosis not present

## 2016-12-13 DIAGNOSIS — Y95 Nosocomial condition: Secondary | ICD-10-CM | POA: Diagnosis not present

## 2016-12-13 DIAGNOSIS — R278 Other lack of coordination: Secondary | ICD-10-CM | POA: Diagnosis not present

## 2016-12-13 LAB — BASIC METABOLIC PANEL
ANION GAP: 12 (ref 5–15)
BUN: 20 mg/dL (ref 6–20)
CHLORIDE: 96 mmol/L — AB (ref 101–111)
CO2: 24 mmol/L (ref 22–32)
Calcium: 8.7 mg/dL — ABNORMAL LOW (ref 8.9–10.3)
Creatinine, Ser: 0.9 mg/dL (ref 0.61–1.24)
Glucose, Bld: 366 mg/dL — ABNORMAL HIGH (ref 65–99)
POTASSIUM: 2.7 mmol/L — AB (ref 3.5–5.1)
SODIUM: 132 mmol/L — AB (ref 135–145)

## 2016-12-13 LAB — GLUCOSE, CAPILLARY
GLUCOSE-CAPILLARY: 342 mg/dL — AB (ref 65–99)
GLUCOSE-CAPILLARY: 231 mg/dL — AB (ref 65–99)
GLUCOSE-CAPILLARY: 182 mg/dL — AB (ref 65–99)
GLUCOSE-CAPILLARY: 152 mg/dL — AB (ref 65–99)

## 2016-12-13 MED ORDER — POTASSIUM CHLORIDE CRYS ER 20 MEQ PO TBCR
40.0000 meq | EXTENDED_RELEASE_TABLET | Freq: Once | ORAL | Status: AC
  Administered 2016-12-13: 40 meq via ORAL
  Filled 2016-12-13: qty 2

## 2016-12-13 MED ORDER — GLUCERNA SHAKE PO LIQD
237.0000 mL | Freq: Three times a day (TID) | ORAL | Status: DC
  Administered 2016-12-13 – 2016-12-23 (×16): 237 mL via ORAL
  Filled 2016-12-13 (×31): qty 237

## 2016-12-13 MED ORDER — BENZONATATE 100 MG PO CAPS
100.0000 mg | ORAL_CAPSULE | Freq: Two times a day (BID) | ORAL | Status: DC
  Administered 2016-12-13 – 2016-12-15 (×3): 100 mg via ORAL
  Filled 2016-12-13 (×4): qty 1

## 2016-12-13 MED ORDER — INSULIN GLARGINE 100 UNIT/ML ~~LOC~~ SOLN
25.0000 [IU] | Freq: Every day | SUBCUTANEOUS | Status: DC
  Administered 2016-12-13 – 2016-12-14 (×2): 25 [IU] via SUBCUTANEOUS
  Filled 2016-12-13 (×2): qty 0.25

## 2016-12-13 MED ORDER — POTASSIUM CHLORIDE 10 MEQ/100ML IV SOLN
10.0000 meq | INTRAVENOUS | Status: AC
  Administered 2016-12-13 (×3): 10 meq via INTRAVENOUS
  Filled 2016-12-13 (×3): qty 100

## 2016-12-13 MED ORDER — GUAIFENESIN ER 600 MG PO TB12
600.0000 mg | ORAL_TABLET | Freq: Two times a day (BID) | ORAL | Status: DC
  Administered 2016-12-13 – 2016-12-23 (×17): 600 mg via ORAL
  Filled 2016-12-13 (×18): qty 1

## 2016-12-13 MED ORDER — SODIUM CHLORIDE 0.9 % IV SOLN
INTRAVENOUS | Status: DC
  Administered 2016-12-13 – 2016-12-22 (×5): via INTRAVENOUS

## 2016-12-13 NOTE — Care Management Obs Status (Signed)
[place] NOTIFICATION   Patient Details  Name: Dylan Quinn MRN: [id number] Date of Birth: Jun 20, 1936   Medicare Observation Status Notification Given:  Yes    CrutchfieldAntony Haste, RN 12/13/2016, 12:09 PM

## 2016-12-13 NOTE — Progress Notes (Signed)
CRITICAL VALUE ALERT  Critical value received:  K+ 2.7  Date of notification: 12/13/16  Time of notification:  0926  Critical value read back: Yes  Nurse who received alert:  [facility]  MD notified (1st page):  Wendee Beavers  Time of first page:  0929  MD notified (2nd page):  Time of second page:  Responding MD:   Time MD responded:

## 2016-12-13 NOTE — Progress Notes (Addendum)
Inpatient Diabetes Program Recommendations  AACE/ADA: New Consensus Statement on Inpatient Glycemic Control (2015)  Target Ranges:  Prepandial:   less than 140 mg/dL      Peak postprandial:   less than 180 mg/dL (1-2 hours)      Critically ill patients:  140 - 180 mg/dL   Lab Results  Component Value Date   GLUCAP 342 (H) 12/13/2016   HGBA1C 10.4 10/09/2016    Review of Glycemic Control Results for Dylan Quinn, Dylan Quinn (MRN [id number]) as of 12/13/2016 10:18  Ref. Range 12/12/2016 07:45 12/12/2016 12:47 12/12/2016 17:16 12/12/2016 21:21 12/13/2016 08:18  Glucose-Capillary Latest Ref Range: 65 - 99 mg/dL 378 (H) 287 (H) 215 (H) 250 (H) 342 (H)   Blood sugars 215-378 mg/dL in past 24H.  Needs insulin adjustment. Pt appeared very withdrawn when this Coordinator tried to engage in conversation.  Inpatient Diabetes Program Recommendations:    Increase Lantus to 30 units Q24H Add meal coverage insulin - Novolog 5 units tidwc if pt eats > 50%.  HgbA1C 10.4% - indicates sub-par glycemic control. F/U with endo at discharge.  Thank you. Lorenda Peck, RD, LDN, CDE Inpatient Diabetes Coordinator [phone]

## 2016-12-13 NOTE — Care Management Note (Signed)
Case Management Note  Patient Details  Name: Dylan Quinn MRN: [id number] Date of Birth: 03/27/1936  Subjective/Objective:    80 y.o. M admitted from Private residence where he lives Ind with spouse. Admitted with PNA. PT eval recommending HHPT yet pt is min guard assist and is fully capable of ADLs. No identified CM needs at present.  Action/Plan: CM will sign off for now but will be available should additional discharge needs arise or disposition change.                  Expected Discharge Date:   (unknown)               Expected Discharge Plan:  Home/Self Care  In-House Referral:  NA  Discharge planning Services  CM Consult  Post Acute Care Choice:  NA Choice offered to:  Patient  DME Arranged:  N/A DME Agency:  NA  HH Arranged:  NA HH Agency:  NA  Status of Service:  Completed, signed off  If discussed at [place] of Stay Meetings, dates discussed:    Additional Comments:  Delrae Sawyers, RN 12/13/2016, 11:15 AM

## 2016-12-13 NOTE — Progress Notes (Signed)
PROGRESS NOTE    Dylan Quinn  [id number] DOB: 1936-03-06 DOA: 12/11/2016 PCP: Tracie Harrier, MD    Brief Narrative: 80 y/o with history of HTN and DM who presented to the hospital with + influenza A and DKA.   Assessment & Plan:   Principal Problem:   Diabetic ketoacidosis without coma associated with type 1 diabetes mellitus ([place]) - successfully transitioned to subcutaneous insulin, Will increase Lantus dose given elevated blood sugars. - Diabetic diet - Transition to floor and improvement in condition - Antiemetic when necessary for nausea, resolving  Hypokalemia - Replace orally, discussed with nursing to replace IV x 3 runs - Reassess next a.m.  Active Problems:   Essential hypertension - Continue ARB and will add HCTZ  Influenza A - Continue Tamiflu and supportive therapy    Nausea & vomiting - secondary to principle problem most likely   DVT prophylaxis: Lovenox Code Status: Full Family Communication: none at bedside Disposition Plan: floor with PT eval   Consultants:   None   Procedures: None   Antimicrobials: oseltamivir   Subjective: Pt has no new complaints. No acute issues overnight.   Objective: Vitals:   12/12/16 1544 12/12/16 2037 12/13/16 0504 12/13/16 0900  BP: (!) 144/89 (!) 142/64 139/71 (!) 165/86  Pulse: (!) 108 89 94 84  Resp: 18 18 18    Temp: 98.2 F (36.8 C) 97.9 F (36.6 C) 100 F (37.8 C)   TempSrc: Oral Oral Oral   SpO2: 98% 97% 97% 98%  Weight:      Height:        Intake/Output Summary (Last 24 hours) at 12/13/16 1431 Last data filed at 12/13/16 0857  Gross per 24 hour  Intake                0 ml  Output              775 ml  Net             -775 ml   Filed Weights   12/11/16 0159  Weight: 97.5 kg (215 lb)    Examination:  General exam: Appears calm and in NAD. Respiratory system: Clear to auscultation. Respiratory effort normal. Cardiovascular system: S1 & S2 heard, RRR. No JVD, murmurs, rubs,  gallops or clicks. No pedal edema. Gastrointestinal system: Abdomen is nondistended, soft and nontender. No organomegaly or masses felt. Normal bowel sounds heard. Central nervous system: Alert and oriented. No focal neurological deficits. Extremities: Symmetric 5 x 5 power. Skin: No rashes, lesions or ulcers, on limited exam. Psychiatry: Judgement and insight appear normal. Mood & affect appropriate.   Data Reviewed: I have personally reviewed following labs and imaging studies  CBC:  Recent Labs Lab 12/09/16 1603 12/11/16 0235 12/11/16 0802  WBC 6.9 7.0 6.6  HGB 12.8* 13.0 11.5*  HCT 37.7* 39.1 32.9*  MCV 92.7 90.7 88.2  PLT 177 194 [id number]   Basic Metabolic Panel:  Recent Labs Lab 12/09/16 1603 12/11/16 0235 12/11/16 0802 12/11/16 1328 12/13/16 0801  NA 133* 134* 138 137 132*  K 3.6 3.8 3.3* 3.2* 2.7*  CL 99* 101 109 106 96*  CO2 21* 17* 20* 21* 24  GLUCOSE 350* 295* 228* 148* 366*  BUN 12 20 15 12 20   CREATININE 0.95 1.15 1.13 0.94 0.90  CALCIUM 9.0 9.1 8.4* 8.5* 8.7*   GFR: Estimated Creatinine Clearance: 81.9 mL/min (by C-G formula based on SCr of 0.9 mg/dL). Liver Function Tests:  Recent Labs Lab 12/09/16 1603 12/11/16 0235  AST 15 21  ALT 10* 14*  ALKPHOS 126 130*  BILITOT 0.9 0.8  PROT 6.7 7.3  ALBUMIN 3.8 3.9    Recent Labs Lab 12/09/16 1603 12/11/16 0235  LIPASE <10* 18   No results for input(s): AMMONIA in the last 168 hours. Coagulation Profile: No results for input(s): INR, PROTIME in the last 168 hours. Cardiac Enzymes:  Recent Labs Lab 12/09/16 1603 12/11/16 0600 12/11/16 0802  TROPONINI <0.03 QUESTIONABLE RESULTS, RECOMMEND RECOLLECT TO VERIFY 0.04*   BNP (last 3 results) No results for input(s): PROBNP in the last 8760 hours. HbA1C: No results for input(s): HGBA1C in the last 72 hours. CBG:  Recent Labs Lab 12/12/16 1247 12/12/16 1716 12/12/16 2121 12/13/16 0818 12/13/16 1136  GLUCAP 287* 215* 250* 342* 231*    Lipid Profile: No results for input(s): CHOL, HDL, LDLCALC, TRIG, CHOLHDL, LDLDIRECT in the last 72 hours. Thyroid Function Tests: No results for input(s): TSH, T4TOTAL, FREET4, T3FREE, THYROIDAB in the last 72 hours. Anemia Panel: No results for input(s): VITAMINB12, FOLATE, FERRITIN, TIBC, IRON, RETICCTPCT in the last 72 hours. Sepsis Labs: No results for input(s): PROCALCITON, LATICACIDVEN in the last 168 hours.  Recent Results (from the past 240 hour(s))  MRSA PCR Screening     Status: None   Collection Time: 12/11/16  5:29 AM  Result Value Ref Range Status   MRSA by PCR NEGATIVE NEGATIVE Final    Comment:        The GeneXpert MRSA Assay (FDA approved for NASAL specimens only), is one component of a comprehensive MRSA colonization surveillance program. It is not intended to diagnose MRSA infection nor to guide or monitor treatment for MRSA infections.      Radiology Studies: No results found.  Scheduled Meds: . brimonidine  1 drop Both Eyes BID   And  . timolol  1 drop Both Eyes BID  . dorzolamide  1 drop Both Eyes BID  . enoxaparin (LOVENOX) injection  40 mg Subcutaneous Q24H  . hydrochlorothiazide  25 mg Oral Daily  . insulin aspart  0-15 Units Subcutaneous TID WC  . insulin aspart  0-5 Units Subcutaneous QHS  . insulin glargine  25 Units Subcutaneous Daily  . irbesartan  300 mg Oral Daily  . latanoprost  1 drop Both Eyes QHS  . oseltamivir  75 mg Oral BID  . pregabalin  100 mg Oral BID   Continuous Infusions:   LOS: 0 days    Time spent: > 35 minutes  Velvet Bathe, MD [facility] Pager [phone]  If 7PM-7AM, please contact night-coverage [email or web address] Password [facility] 12/13/2016, 2:31 PM

## 2016-12-13 NOTE — Evaluation (Signed)
Physical Therapy Evaluation Patient Details Name: Dylan Quinn MRN: [id number] DOB: 1936/03/07 Today's Date: 12/13/2016   History of Present Illness  80 yo male admitted with diabetic ketoacidosis, +flu. Hx of DM, HTN, peripheral neuropathy, osteoporosis, prostate ca.  Clinical Impression  On eval, pt required Min guard assist for mobility. Pt presents with general weakness, decreased activity tolerance, and impaired gait and balance. Pt only able to tolerate minimal activity on today. Assisted pt back to bed at his request at end of session. Recommend daily ambulation with nursing supervision. Will follow and progress activity as tolerated. May need HHPT depending on progress.     Follow Up Recommendations Home health PT vs No PT follow up (depending on progress);Supervision - Intermittent     Equipment Recommendations  None recommended by PT    Recommendations for Other Services       Precautions / Restrictions Precautions Precautions: Fall Precaution Comments: droplet Restrictions Weight Bearing Restrictions: No      Mobility  Bed Mobility Overal bed mobility: Needs Assistance Bed Mobility: Supine to Sit;Sit to Supine     Supine to sit: Supervision Sit to supine: Supervision      Transfers Overall transfer level: Needs assistance Equipment used: Rolling walker (2 wheeled) Transfers: Sit to/from Stand Sit to Stand: Min guard         General transfer comment: close guard for safety  Ambulation/Gait Ambulation/Gait assistance: Min guard Ambulation Distance (Feet): 15 Feet Assistive device: Rolling walker (2 wheeled) Gait Pattern/deviations: Step-through pattern;Decreased stride length     General Gait Details: very close guarding. Distance limited by lightheadedness and fatigue.   Stairs            Wheelchair Mobility    Modified Rankin (Stroke Patients Only)       Balance Overall balance assessment: Needs assistance            Standing balance-Leahy Scale: Poor Standing balance comment: requires support of walker or cane                             Pertinent Vitals/Pain Pain Assessment: Faces Faces Pain Scale: Hurts even more Pain Location: bil knees Pain Descriptors / Indicators: Sore Pain Intervention(s): Limited activity within patient's tolerance;Repositioned    Home Living Family/patient expects to be discharged to:: Private residence Living Arrangements: Spouse/significant other Available Help at Discharge: Family Type of Home: House Home Access: Stairs to enter Entrance Stairs-Rails: None Entrance Stairs-Number of Steps: 3 Home Layout: One level Home Equipment: Environmental consultant - 4 wheels;Cane - single point      Prior Function Level of Independence: Independent with assistive device(s)         Comments: ambulatory with cane usually. He has been using rollator when out in community due to bil knee pain, fatigue     Hand Dominance        Extremity/Trunk Assessment   Upper Extremity Assessment Upper Extremity Assessment: Generalized weakness    Lower Extremity Assessment Lower Extremity Assessment: Generalized weakness    Cervical / Trunk Assessment Cervical / Trunk Assessment: Normal  Communication   Communication: No difficulties  Cognition Arousal/Alertness: Awake/alert Behavior During Therapy: WFL for tasks assessed/performed Overall Cognitive Status: Within Functional Limits for tasks assessed                      General Comments      Exercises     Assessment/Plan    PT Assessment  Patient needs continued PT services  PT Problem List Decreased strength;Decreased mobility;Decreased balance;Decreased activity tolerance;Pain          PT Treatment Interventions DME instruction;Gait training;Therapeutic activities;Therapeutic exercise;Patient/family education;Functional mobility training    PT Goals (Current goals can be found in the Care Plan section)   Acute Rehab PT Goals Patient Stated Goal: to feel better PT Goal Formulation: With patient Time For Goal Achievement: 12/27/16 Potential to Achieve Goals: Good    Frequency Min 3X/week   Barriers to discharge        Co-evaluation               End of Session   Activity Tolerance: Patient limited by fatigue (Limited by lightheadedness) Patient left: in bed;with call bell/phone within reach;with bed alarm set      Functional Assessment Tool Used: clinical judgement Functional Limitation: Mobility: Walking and moving around Mobility: Walking and Moving Around Current Status [id number]): At least 1 percent but less than 20 percent impaired, limited or restricted Mobility: Walking and Moving Around Goal Status [phone]): At least 1 percent but less than 20 percent impaired, limited or restricted    Time: 0941-1000 PT Time Calculation (min) (ACUTE ONLY): 19 min   Charges:   PT Evaluation $PT Eval Low Complexity: 1 Procedure     PT G Codes:   PT G-Codes **NOT FOR INPATIENT CLASS** Functional Assessment Tool Used: clinical judgement Functional Limitation: Mobility: Walking and moving around Mobility: Walking and Moving Around Current Status [id number]): At least 1 percent but less than 20 percent impaired, limited or restricted Mobility: Walking and Moving Around Goal Status [phone]): At least 1 percent but less than 20 percent impaired, limited or restricted    Weston Anna, MPT Pager: [phone]

## 2016-12-14 LAB — BASIC METABOLIC PANEL
ANION GAP: 12 (ref 5–15)
Anion gap: 11 (ref 5–15)
BUN: 22 mg/dL — AB (ref 6–20)
BUN: 24 mg/dL — ABNORMAL HIGH (ref 6–20)
CALCIUM: 8.7 mg/dL — AB (ref 8.9–10.3)
CHLORIDE: 95 mmol/L — AB (ref 101–111)
CO2: 25 mmol/L (ref 22–32)
CO2: 24 mmol/L (ref 22–32)
CREATININE: 1.1 mg/dL (ref 0.61–1.24)
CREATININE: 1.1 mg/dL (ref 0.61–1.24)
Calcium: 8.4 mg/dL — ABNORMAL LOW (ref 8.9–10.3)
Chloride: 93 mmol/L — ABNORMAL LOW (ref 101–111)
GFR calc Af Amer: >60 mL/min (ref >60)
GFR calc non Af Amer: >60 mL/min (ref >60)
GLUCOSE: 273 mg/dL — AB (ref 65–99)
Glucose, Bld: 251 mg/dL — ABNORMAL HIGH (ref 65–99)
POTASSIUM: 3.3 mmol/L — AB (ref 3.5–5.1)
Potassium: 2.8 mmol/L — ABNORMAL LOW (ref 3.5–5.1)
Sodium: 130 mmol/L — ABNORMAL LOW (ref 135–145)
Sodium: 130 mmol/L — ABNORMAL LOW (ref 135–145)

## 2016-12-14 LAB — GLUCOSE, CAPILLARY
GLUCOSE-CAPILLARY: 192 mg/dL — AB (ref 65–99)
GLUCOSE-CAPILLARY: 194 mg/dL — AB (ref 65–99)
Glucose-Capillary: 289 mg/dL — ABNORMAL HIGH (ref 65–99)
Glucose-Capillary: 304 mg/dL — ABNORMAL HIGH (ref 65–99)

## 2016-12-14 MED ORDER — POTASSIUM CHLORIDE 10 MEQ/100ML IV SOLN
10.0000 meq | INTRAVENOUS | Status: AC
  Administered 2016-12-14 (×3): 10 meq via INTRAVENOUS
  Filled 2016-12-14 (×3): qty 100

## 2016-12-14 MED ORDER — INSULIN GLARGINE 100 UNIT/ML ~~LOC~~ SOLN
5.0000 [IU] | Freq: Once | SUBCUTANEOUS | Status: AC
Start: 2016-12-14 — End: 2016-12-14
  Administered 2016-12-14: 5 [IU] via SUBCUTANEOUS
  Filled 2016-12-14: qty 0.05

## 2016-12-14 MED ORDER — INSULIN GLARGINE 100 UNIT/ML ~~LOC~~ SOLN
30.0000 [IU] | Freq: Every day | SUBCUTANEOUS | Status: DC
  Administered 2016-12-15 – 2016-12-23 (×8): 30 [IU] via SUBCUTANEOUS
  Filled 2016-12-14 (×9): qty 0.3

## 2016-12-14 MED ORDER — SODIUM CHLORIDE 0.9 % IV SOLN: 30.0000 meq | Freq: Once | INTRAVENOUS | Status: DC

## 2016-12-14 MED ORDER — POTASSIUM CHLORIDE CRYS ER 20 MEQ PO TBCR
40.0000 meq | EXTENDED_RELEASE_TABLET | Freq: Two times a day (BID) | ORAL | Status: DC
  Administered 2016-12-14: 40 meq via ORAL
  Filled 2016-12-14 (×3): qty 2

## 2016-12-14 NOTE — Progress Notes (Signed)
Inpatient Diabetes Program Recommendations  AACE/ADA: New Consensus Statement on Inpatient Glycemic Control (2015)  Target Ranges:  Prepandial:   less than 140 mg/dL      Peak postprandial:   less than 180 mg/dL (1-2 hours)      Critically ill patients:  140 - 180 mg/dL   Results for Dylan Quinn, Dylan Quinn (MRN [id number]) as of 12/14/2016 11:33  Ref. Range 12/13/2016 08:18 12/13/2016 11:36 12/13/2016 16:38 12/13/2016 21:07  Glucose-Capillary Latest Ref Range: 65 - 99 mg/dL 342 (H) 231 (H) 182 (H) 152 (H)   Results for Dylan Quinn, Dylan Quinn (MRN [id number]) as of 12/14/2016 11:33  Ref. Range 12/14/2016 07:39  Glucose-Capillary Latest Ref Range: 65 - 99 mg/dL 289 (H)    Home DM Meds: Lantus 22 units daily       Humalog 7-15 units TID per SSI  Current Insulin Orders: Lantus 25 units daily      Novolog Moderate Correction Scale/ SSI (0-15 units) TID AC + HS     MD- Please consider the following in-hospital insulin adjustments:  1. Increase Lantus to 30 units daily  2. Start Novolog Meal Coverage: Novolog 4 units TID with meals (hold if pt eats <50% of meal)      --Will follow patient during hospitalization--  Wyn Quaker RN, MSN, CDE Diabetes Coordinator Inpatient Glycemic Control Team Team Pager: [phone] (8a-5p)

## 2016-12-14 NOTE — Progress Notes (Signed)
PT Cancellation Note  Patient Details Name: Dylan Quinn MRN: [id number] DOB: 06-25-1936   Cancelled Treatment:    Reason Eval/Treat Not Completed: Attempted PT tx session-pt declined to participate on today. Will check back another day. Encouraged pt to try to get up at some point with nursing today, and over wknd, if possible.    Weston Anna, MPT Pager: [phone]

## 2016-12-14 NOTE — Progress Notes (Signed)
PROGRESS NOTE    Dylan Quinn  [id number] DOB: 18-May-1936 DOA: 12/11/2016 PCP: Tracie Harrier, MD    Brief Narrative:  80 y/o with history of HTN and DM who presented to the hospital with + influenza A and DKA.    Assessment & Plan:   Principal Problem:   Diabetic ketoacidosis without coma associated with type 1 diabetes mellitus ([place]) Active Problems:   Essential hypertension   URI (upper respiratory infection)   Nausea & vomiting   DKA, type 1 (HCC)   Diabetic ketoacidosis without coma associated with type 1 diabetes mellitus ([place]) - successfully transitioned to subcutaneous insulin, Will increase Lantus dose given elevated blood sugars - changed lantus to 30 units to start tomorrow - Diabetic diet - Antiemetic when necessary for nausea, resolving - repeat BMP in am  Hypokalemia - low again this am - repleted - PO supplementation of 86mEq BID    Essential hypertension - Continue ARB and will add HCTZ  Influenza A - Continue Tamiflu and supportive therapy    Nausea & vomiting - secondary to principle problem most likely - improved with improvement in labs  Low UOP - will reassess creatinine in am - follow UOP - may need to order urine labs   DVT prophylaxis: Lovenox Code Status: Full Family Communication: none at bedside Disposition Plan: floor with PT eval   Consultants:   PT  Diabetes Coordinator  Procedures:   None  Antimicrobials:   Tamiflu    Subjective: Patient states he is having some nausea still.  Says he feels slightly like he did when he was in DKA. Repeat BMP pending at time of exam.   Objective: Vitals:   12/13/16 1758 12/13/16 2102 12/14/16 0617 12/14/16 1118  BP:  135/73 (!) 145/67 137/65  Pulse:  (!) 115 96 89  Resp:  18 18 20   Temp: 98.4 F (36.9 C) 100.3 F (37.9 C) 99 F (37.2 C) 98.8 F (37.1 C)  TempSrc: Oral Oral Oral Oral  SpO2:  97% 93% 92%  Weight:      Height:        Intake/Output  Summary (Last 24 hours) at 12/14/16 1545 Last data filed at 12/14/16 1321  Gross per 24 hour  Intake           774.17 ml  Output             1075 ml  Net          -300.83 ml   Filed Weights   12/11/16 0159  Weight: 97.5 kg (215 lb)    Examination:  General exam: Appears calm and comfortable  Respiratory system: Clear to auscultation. Respiratory effort normal. Cardiovascular system: S1 & S2 heard, RRR. No JVD, murmurs, rubs, gallops or clicks. No pedal edema. Gastrointestinal system: Abdomen is nondistended, soft and nontender. No organomegaly or masses felt. Normal bowel sounds heard. Central nervous system: Alert and oriented. No focal neurological deficits. Extremities: Symmetric 5 x 5 power. Skin: No rashes, lesions or ulcers Psychiatry: Judgement and insight appear normal. Mood & affect appropriate.     Data Reviewed: I have personally reviewed following labs and imaging studies  CBC:  Recent Labs Lab 12/09/16 1603 12/11/16 0235 12/11/16 0802  WBC 6.9 7.0 6.6  HGB 12.8* 13.0 11.5*  HCT 37.7* 39.1 32.9*  MCV 92.7 90.7 88.2  PLT 177 194 [id number]   Basic Metabolic Panel:  Recent Labs Lab 12/11/16 0802 12/11/16 1328 12/13/16 0801 12/14/16 0523 12/14/16 1345  NA 138 137  132* 130* 130*  K 3.3* 3.2* 2.7* 2.8* 3.3*  CL 109 106 96* 93* 95*  CO2 20* 21* 24 25 24   GLUCOSE 228* 148* 366* 273* 251*  BUN 15 12 20  22* 24*  CREATININE 1.13 0.94 0.90 1.10 1.10  CALCIUM 8.4* 8.5* 8.7* 8.7* 8.4*   GFR: Estimated Creatinine Clearance: 67 mL/min (by C-G formula based on SCr of 1.1 mg/dL). Liver Function Tests:  Recent Labs Lab 12/09/16 1603 12/11/16 0235  AST 15 21  ALT 10* 14*  ALKPHOS 126 130*  BILITOT 0.9 0.8  PROT 6.7 7.3  ALBUMIN 3.8 3.9    Recent Labs Lab 12/09/16 1603 12/11/16 0235  LIPASE <10* 18   No results for input(s): AMMONIA in the last 168 hours. Coagulation Profile: No results for input(s): INR, PROTIME in the last 168 hours. Cardiac  Enzymes:  Recent Labs Lab 12/09/16 1603 12/11/16 0600 12/11/16 0802  TROPONINI <0.03 QUESTIONABLE RESULTS, RECOMMEND RECOLLECT TO VERIFY 0.04*   BNP (last 3 results) No results for input(s): PROBNP in the last 8760 hours. HbA1C: No results for input(s): HGBA1C in the last 72 hours. CBG:  Recent Labs Lab 12/13/16 1136 12/13/16 1638 12/13/16 2107 12/14/16 0739 12/14/16 1122  GLUCAP 231* 182* 152* 289* 304*   Lipid Profile: No results for input(s): CHOL, HDL, LDLCALC, TRIG, CHOLHDL, LDLDIRECT in the last 72 hours. Thyroid Function Tests: No results for input(s): TSH, T4TOTAL, FREET4, T3FREE, THYROIDAB in the last 72 hours. Anemia Panel: No results for input(s): VITAMINB12, FOLATE, FERRITIN, TIBC, IRON, RETICCTPCT in the last 72 hours. Sepsis Labs: No results for input(s): PROCALCITON, LATICACIDVEN in the last 168 hours.  Recent Results (from the past 240 hour(s))  MRSA PCR Screening     Status: None   Collection Time: 12/11/16  5:29 AM  Result Value Ref Range Status   MRSA by PCR NEGATIVE NEGATIVE Final    Comment:        The GeneXpert MRSA Assay (FDA approved for NASAL specimens only), is one component of a comprehensive MRSA colonization surveillance program. It is not intended to diagnose MRSA infection nor to guide or monitor treatment for MRSA infections.          Radiology Studies: No results found.      Scheduled Meds: . benzonatate  100 mg Oral BID  . brimonidine  1 drop Both Eyes BID   And  . timolol  1 drop Both Eyes BID  . dorzolamide  1 drop Both Eyes BID  . enoxaparin (LOVENOX) injection  40 mg Subcutaneous Q24H  . feeding supplement (GLUCERNA SHAKE)  237 mL Oral TID BM  . guaiFENesin  600 mg Oral BID  . hydrochlorothiazide  25 mg Oral Daily  . insulin aspart  0-15 Units Subcutaneous TID WC  . insulin aspart  0-5 Units Subcutaneous QHS  . insulin glargine  25 Units Subcutaneous Daily  . irbesartan  300 mg Oral Daily  . latanoprost   1 drop Both Eyes QHS  . oseltamivir  75 mg Oral BID  . potassium chloride  40 mEq Oral BID  . pregabalin  100 mg Oral BID   Continuous Infusions: . sodium chloride 100 mL/hr at 12/14/16 1331     LOS: 1 day    Time spent: 30 minutes    Loretha Stapler, MD [facility] Pager [phone]  If 7PM-7AM, please contact night-coverage [email or web address] Password [facility] 12/14/2016, 3:45 PM

## 2016-12-15 ENCOUNTER — Inpatient Hospital Stay (HOSPITAL_COMMUNITY): Payer: Medicare Other

## 2016-12-15 LAB — BASIC METABOLIC PANEL
ANION GAP: 10 (ref 5–15)
Anion gap: 12 (ref 5–15)
BUN: 16 mg/dL (ref 6–20)
BUN: 11 mg/dL (ref 6–20)
CHLORIDE: 97 mmol/L — AB (ref 101–111)
CHLORIDE: 94 mmol/L — AB (ref 101–111)
CO2: 24 mmol/L (ref 22–32)
CO2: 26 mmol/L (ref 22–32)
CREATININE: 0.76 mg/dL (ref 0.61–1.24)
Calcium: 8 mg/dL — ABNORMAL LOW (ref 8.9–10.3)
Calcium: 8.3 mg/dL — ABNORMAL LOW (ref 8.9–10.3)
Creatinine, Ser: 0.88 mg/dL (ref 0.61–1.24)
GFR calc Af Amer: >60 mL/min (ref >60)
GFR calc non Af Amer: >60 mL/min (ref >60)
GFR calc non Af Amer: >60 mL/min (ref >60)
Glucose, Bld: 242 mg/dL — ABNORMAL HIGH (ref 65–99)
Glucose, Bld: 115 mg/dL — ABNORMAL HIGH (ref 65–99)
POTASSIUM: 3.1 mmol/L — AB (ref 3.5–5.1)
Potassium: 2.5 mmol/L — CL (ref 3.5–5.1)
SODIUM: 131 mmol/L — AB (ref 135–145)
SODIUM: 132 mmol/L — AB (ref 135–145)

## 2016-12-15 LAB — CBC
HEMATOCRIT: 34.9 % — AB (ref 39.0–52.0)
HEMOGLOBIN: 12.2 g/dL — AB (ref 13.0–17.0)
MCH: 30.2 pg (ref 26.0–34.0)
MCHC: 35 g/dL (ref 30.0–36.0)
MCV: 86.4 fL (ref 78.0–100.0)
Platelets: 195 10*3/uL (ref 150–400)
RBC: 4.04 MIL/uL — ABNORMAL LOW (ref 4.22–5.81)
RDW: 13.7 % (ref 11.5–15.5)
WBC: 12.7 10*3/uL — ABNORMAL HIGH (ref 4.0–10.5)

## 2016-12-15 LAB — GLUCOSE, CAPILLARY
GLUCOSE-CAPILLARY: 276 mg/dL — AB (ref 65–99)
GLUCOSE-CAPILLARY: 170 mg/dL — AB (ref 65–99)
Glucose-Capillary: 110 mg/dL — ABNORMAL HIGH (ref 65–99)
Glucose-Capillary: 125 mg/dL — ABNORMAL HIGH (ref 65–99)
Glucose-Capillary: 194 mg/dL — ABNORMAL HIGH (ref 65–99)

## 2016-12-15 LAB — BRAIN NATRIURETIC PEPTIDE: B Natriuretic Peptide: 201.8 pg/mL — ABNORMAL HIGH (ref 0.0–100.0)

## 2016-12-15 LAB — SEDIMENTATION RATE: Sed Rate: 77 mm/hr — ABNORMAL HIGH (ref 0–16)

## 2016-12-15 MED ORDER — CALCIUM CARBONATE ANTACID 500 MG PO CHEW
400.0000 mg | CHEWABLE_TABLET | Freq: Every day | ORAL | Status: DC
  Administered 2016-12-15 – 2016-12-21 (×6): 400 mg via ORAL
  Filled 2016-12-15 (×6): qty 2

## 2016-12-15 MED ORDER — SODIUM CHLORIDE 0.9 % IV SOLN: 30.0000 meq | Freq: Once | INTRAVENOUS | Status: DC

## 2016-12-15 MED ORDER — POTASSIUM CHLORIDE 10 MEQ/100ML IV SOLN
10.0000 meq | INTRAVENOUS | Status: AC
  Administered 2016-12-15 (×3): 10 meq via INTRAVENOUS
  Filled 2016-12-15 (×3): qty 100

## 2016-12-15 MED ORDER — IRBESARTAN 150 MG PO TABS
300.0000 mg | ORAL_TABLET | Freq: Every day | ORAL | Status: DC
  Administered 2016-12-15 – 2016-12-23 (×8): 300 mg via ORAL
  Filled 2016-12-15 (×8): qty 2

## 2016-12-15 MED ORDER — POTASSIUM CHLORIDE CRYS ER 20 MEQ PO TBCR
40.0000 meq | EXTENDED_RELEASE_TABLET | Freq: Once | ORAL | Status: AC
  Administered 2016-12-15: 40 meq via ORAL
  Filled 2016-12-15: qty 2

## 2016-12-15 MED ORDER — CEFEPIME HCL 1 G IJ SOLR
1.0000 g | Freq: Three times a day (TID) | INTRAMUSCULAR | Status: DC
  Administered 2016-12-15 – 2016-12-17 (×6): 1 g via INTRAVENOUS
  Filled 2016-12-15 (×7): qty 1

## 2016-12-15 MED ORDER — POTASSIUM CHLORIDE CRYS ER 20 MEQ PO TBCR
20.0000 meq | EXTENDED_RELEASE_TABLET | Freq: Once | ORAL | Status: AC
  Administered 2016-12-15: 20 meq via ORAL
  Filled 2016-12-15: qty 1

## 2016-12-15 MED ORDER — VANCOMYCIN HCL 10 G IV SOLR
2000.0000 mg | Freq: Once | INTRAVENOUS | Status: AC
  Administered 2016-12-15: 2000 mg via INTRAVENOUS
  Filled 2016-12-15: qty 2000

## 2016-12-15 MED ORDER — BENZONATATE 100 MG PO CAPS
100.0000 mg | ORAL_CAPSULE | Freq: Two times a day (BID) | ORAL | Status: DC | PRN
  Administered 2016-12-17 – 2016-12-22 (×5): 100 mg via ORAL
  Filled 2016-12-15 (×5): qty 1

## 2016-12-15 MED ORDER — VANCOMYCIN HCL 10 G IV SOLR
1250.0000 mg | Freq: Two times a day (BID) | INTRAVENOUS | Status: DC
  Administered 2016-12-16 – 2016-12-17 (×4): 1250 mg via INTRAVENOUS
  Filled 2016-12-15 (×5): qty 1250

## 2016-12-15 NOTE — Progress Notes (Signed)
Spouse reports hemoptysis since 1730. Provider paged received new order for Sputum culture.

## 2016-12-15 NOTE — Progress Notes (Signed)
CXR back with concerns for pneumonia vs hemorrhage. Discussed with Elink- patient current stable.  Treating for pneumonia and order BNP and Sed Rate to evaluate for inflammatory process vs possible increased fluid causing hemoptysis.  CBC ordered.  As patient has been hospitalized since 12/19 will treat for HCAP.

## 2016-12-15 NOTE — Progress Notes (Signed)
PROGRESS NOTE    Dylan Quinn  [id number] DOB: 12-27-1935 DOA: 12/11/2016 PCP: Tracie Harrier, MD    Brief Narrative:  80 y/o with history of HTN and DM who presented to the hospital with + influenza A and DKA.    Assessment & Plan:   Principal Problem:   Diabetic ketoacidosis without coma associated with type 1 diabetes mellitus ([place]) Active Problems:   Essential hypertension   URI (upper respiratory infection)   Nausea & vomiting   DKA, type 1 (HCC)   Diabetic ketoacidosis without coma associated with type 1 diabetes mellitus ([place]) - successfully transitioned to subcutaneous insulin, Will increase Lantus dose given elevated blood sugars - lantus to 30 units qAM - Diabetic diet - Antiemetic when necessary for nausea, resolving - repeat BMP in qAM  Hemoptysis - sputum culture pending - per nursing staff patient had just previously eaten red jello  Hypokalemia - low again this am - repleted again - will continue IV KCl as patient not able to swallow large pills    Essential hypertension - Continue ARB and HCTZ  Influenza A - Continue Tamiflu and supportive therapy - tomorrow is last day    Nausea & vomiting - secondary to principle problem most likely - improved with improvement in labs  Low UOP - good urine output today - will continue to follow   DVT prophylaxis: Lovenox Code Status: Full Family Communication: none at bedside Disposition Plan: floor with PT eval   Consultants:   PT  Diabetes Coordinator  Procedures:   None  Antimicrobials:   Tamiflu    Subjective: Patient reports he is mostly just tired.  States he feels like all he does in the hospital is take pills.  Wants to know if there is any way to cut down on the medications he is taking.  Daughter called and voiced concern about patient red streaked sputum last night.  Sputum culture pending.  Patient remains afebrile, on room air, not tachypneic.  Eating well per  wife who is bedside.   Objective: Vitals:   12/14/16 2059 12/15/16 0702 12/15/16 0750 12/15/16 1456  BP: (!) 155/67 (!) 161/72  (!) 153/60  Pulse: 96 95  88  Resp: 18 20  20   Temp: 98.5 F (36.9 C) 99.9 F (37.7 C) 98.9 F (37.2 C) 100.3 F (37.9 C)  TempSrc: Oral Oral Oral Oral  SpO2: 94% 95%  98%  Weight:      Height:        Intake/Output Summary (Last 24 hours) at 12/15/16 1525 Last data filed at 12/15/16 1300  Gross per 24 hour  Intake              480 ml  Output             1100 ml  Net             -620 ml   Filed Weights   12/11/16 0159  Weight: 97.5 kg (215 lb)    Examination:  General exam: Appears calm and comfortable  Respiratory system:  Respiratory effort normal. Few crackles at bases Cardiovascular system: S1 & S2 heard, RRR. No JVD, murmurs, rubs, gallops or clicks. No pedal edema. Gastrointestinal system: Abdomen is nondistended, soft and nontender. No organomegaly or masses felt. Normal bowel sounds heard. Central nervous system: Alert and oriented. No focal neurological deficits. Extremities: Symmetric 5 x 5 power. Skin: No rashes, lesions or ulcers Psychiatry: Judgement and insight appear normal. Mood & affect appropriate.  Data Reviewed: I have personally reviewed following labs and imaging studies  CBC:  Recent Labs Lab 12/09/16 1603 12/11/16 0235 12/11/16 0802  WBC 6.9 7.0 6.6  HGB 12.8* 13.0 11.5*  HCT 37.7* 39.1 32.9*  MCV 92.7 90.7 88.2  PLT 177 194 [id number]   Basic Metabolic Panel:  Recent Labs Lab 12/11/16 1328 12/13/16 0801 12/14/16 0523 12/14/16 1345 12/15/16 0606  NA 137 132* 130* 130* 131*  K 3.2* 2.7* 2.8* 3.3* 3.1*  CL 106 96* 93* 95* 97*  CO2 21* 24 25 24 24   GLUCOSE 148* 366* 273* 251* 242*  BUN 12 20 22* 24* 16  CREATININE 0.94 0.90 1.10 1.10 0.88  CALCIUM 8.5* 8.7* 8.7* 8.4* 8.0*   GFR: Estimated Creatinine Clearance: 83.8 mL/min (by C-G formula based on SCr of 0.88 mg/dL). Liver Function Tests:  Recent  Labs Lab 12/09/16 1603 12/11/16 0235  AST 15 21  ALT 10* 14*  ALKPHOS 126 130*  BILITOT 0.9 0.8  PROT 6.7 7.3  ALBUMIN 3.8 3.9    Recent Labs Lab 12/09/16 1603 12/11/16 0235  LIPASE <10* 18   No results for input(s): AMMONIA in the last 168 hours. Coagulation Profile: No results for input(s): INR, PROTIME in the last 168 hours. Cardiac Enzymes:  Recent Labs Lab 12/09/16 1603 12/11/16 0600 12/11/16 0802  TROPONINI <0.03 QUESTIONABLE RESULTS, RECOMMEND RECOLLECT TO VERIFY 0.04*   BNP (last 3 results) No results for input(s): PROBNP in the last 8760 hours. HbA1C: No results for input(s): HGBA1C in the last 72 hours. CBG:  Recent Labs Lab 12/14/16 1122 12/14/16 1652 12/14/16 2135 12/15/16 0754 12/15/16 1208  GLUCAP 304* 192* 194* 276* 194*   Lipid Profile: No results for input(s): CHOL, HDL, LDLCALC, TRIG, CHOLHDL, LDLDIRECT in the last 72 hours. Thyroid Function Tests: No results for input(s): TSH, T4TOTAL, FREET4, T3FREE, THYROIDAB in the last 72 hours. Anemia Panel: No results for input(s): VITAMINB12, FOLATE, FERRITIN, TIBC, IRON, RETICCTPCT in the last 72 hours. Sepsis Labs: No results for input(s): PROCALCITON, LATICACIDVEN in the last 168 hours.  Recent Results (from the past 240 hour(s))  MRSA PCR Screening     Status: None   Collection Time: 12/11/16  5:29 AM  Result Value Ref Range Status   MRSA by PCR NEGATIVE NEGATIVE Final    Comment:        The GeneXpert MRSA Assay (FDA approved for NASAL specimens only), is one component of a comprehensive MRSA colonization surveillance program. It is not intended to diagnose MRSA infection nor to guide or monitor treatment for MRSA infections.   Culture, expectorated sputum-assessment     Status: None   Collection Time: 12/15/16  7:03 AM  Result Value Ref Range Status   Specimen Description SPUTUM  Final   Special Requests Normal  Final   Sputum evaluation THIS SPECIMEN IS ACCEPTABLE FOR SPUTUM  CULTURE  Final   Report Status 12/15/2016 FINAL  Final         Radiology Studies: No results found.      Scheduled Meds: . benzonatate  100 mg Oral BID  . brimonidine  1 drop Both Eyes BID   And  . timolol  1 drop Both Eyes BID  . calcium carbonate  400 mg of elemental calcium Oral Daily  . dorzolamide  1 drop Both Eyes BID  . enoxaparin (LOVENOX) injection  40 mg Subcutaneous Q24H  . feeding supplement (GLUCERNA SHAKE)  237 mL Oral TID BM  . guaiFENesin  600 mg Oral BID  .  hydrochlorothiazide  25 mg Oral Daily  . insulin aspart  0-15 Units Subcutaneous TID WC  . insulin aspart  0-5 Units Subcutaneous QHS  . insulin glargine  30 Units Subcutaneous Daily  . irbesartan  300 mg Oral Daily  . latanoprost  1 drop Both Eyes QHS  . oseltamivir  75 mg Oral BID  . pregabalin  100 mg Oral BID   Continuous Infusions: . sodium chloride 100 mL/hr at 12/15/16 1250     LOS: 2 days    Time spent: 30 minutes    Loretha Stapler, MD [facility] Pager [phone]  If 7PM-7AM, please contact night-coverage [email or web address] Password [facility] 12/15/2016, 3:25 PM

## 2016-12-15 NOTE — Progress Notes (Signed)
Pt w/ hemoptysis, no recent red food intake, RN observed in emesis bag.  MD paged. DG chest ordered. Will continue to monitor.  Kizzie Ide, RN

## 2016-12-15 NOTE — Progress Notes (Signed)
Pharmacy Antibiotic Note  Dylan Quinn is a 80 y.o. male admitted on 12/11/2016 with pneumonia.  Pharmacy has been consulted for vancomycin dosing.  Plan: Vancomycin 2gm IV x 1 then 1250mg  IV q12h Cefepime 1gm IV q8h (per MD) Follow renal function, cultures and clinical course  Height: 6' (182.9 cm) Weight: 215 lb (97.5 kg) IBW/kg (Calculated) : 77.6  Temp (24hrs), Avg:99.4 F (37.4 C), Min:98.5 F (36.9 C), Max:100.3 F (37.9 C)   Recent Labs Lab 12/09/16 1603 12/11/16 0235 12/11/16 0802 12/11/16 1328 12/13/16 0801 12/14/16 0523 12/14/16 1345 12/15/16 0606  WBC 6.9 7.0 6.6  --   --   --   --   --   CREATININE 0.95 1.15 1.13 0.94 0.90 1.10 1.10 0.88    Estimated Creatinine Clearance: 83.8 mL/min (by C-G formula based on SCr of 0.88 mg/dL).    Allergies  Allergen Reactions  . Lisinopril Hives    hives    Antimicrobials this admission: 12/23 vanc >> 12/23 cefepime >>   Microbiology results: 12/23 BCx:  12/23 Sputum:    Thank you for allowing pharmacy to be a part of this patient's care.  Dolly Rias RPh 12/15/2016, 6:48 PM Pager [phone]

## 2016-12-16 LAB — BASIC METABOLIC PANEL
Anion gap: 10 (ref 5–15)
Anion gap: 9 (ref 5–15)
BUN: 15 mg/dL (ref 6–20)
BUN: 15 mg/dL (ref 6–20)
CO2: 27 mmol/L (ref 22–32)
CO2: 27 mmol/L (ref 22–32)
CREATININE: 1.2 mg/dL (ref 0.61–1.24)
Calcium: 7.8 mg/dL — ABNORMAL LOW (ref 8.9–10.3)
Calcium: 8 mg/dL — ABNORMAL LOW (ref 8.9–10.3)
Chloride: 95 mmol/L — ABNORMAL LOW (ref 101–111)
Chloride: 97 mmol/L — ABNORMAL LOW (ref 101–111)
Creatinine, Ser: 1.14 mg/dL (ref 0.61–1.24)
GFR, EST NON AFRICAN AMERICAN: 60 mL/min — AB (ref >60)
GFR, EST NON AFRICAN AMERICAN: 56 mL/min — AB (ref >60)
Glucose, Bld: 216 mg/dL — ABNORMAL HIGH (ref 65–99)
Glucose, Bld: 128 mg/dL — ABNORMAL HIGH (ref 65–99)
POTASSIUM: 3 mmol/L — AB (ref 3.5–5.1)
POTASSIUM: 2.8 mmol/L — AB (ref 3.5–5.1)
SODIUM: 132 mmol/L — AB (ref 135–145)
SODIUM: 133 mmol/L — AB (ref 135–145)

## 2016-12-16 LAB — GLUCOSE, CAPILLARY
GLUCOSE-CAPILLARY: 119 mg/dL — AB (ref 65–99)
GLUCOSE-CAPILLARY: 212 mg/dL — AB (ref 65–99)
GLUCOSE-CAPILLARY: 108 mg/dL — AB (ref 65–99)
Glucose-Capillary: 190 mg/dL — ABNORMAL HIGH (ref 65–99)
Glucose-Capillary: 121 mg/dL — ABNORMAL HIGH (ref 65–99)

## 2016-12-16 LAB — MAGNESIUM: MAGNESIUM: 1.5 mg/dL — AB (ref 1.7–2.4)

## 2016-12-16 LAB — POTASSIUM: Potassium: 2.8 mmol/L — ABNORMAL LOW (ref 3.5–5.1)

## 2016-12-16 LAB — HIV ANTIBODY (ROUTINE TESTING W REFLEX): HIV Screen 4th Generation wRfx: NONREACTIVE

## 2016-12-16 MED ORDER — SODIUM CHLORIDE 0.9 % IV SOLN: 30.0000 meq | Freq: Once | INTRAVENOUS | Status: DC

## 2016-12-16 MED ORDER — POTASSIUM CHLORIDE 10 MEQ/100ML IV SOLN
10.0000 meq | INTRAVENOUS | Status: AC
  Administered 2016-12-16 (×3): 10 meq via INTRAVENOUS
  Filled 2016-12-16 (×3): qty 100

## 2016-12-16 MED ORDER — MAGNESIUM SULFATE 2 GM/50ML IV SOLN
2.0000 g | Freq: Once | INTRAVENOUS | Status: AC
  Administered 2016-12-16: 2 g via INTRAVENOUS
  Filled 2016-12-16: qty 50

## 2016-12-16 NOTE — Progress Notes (Signed)
Physical Therapy Treatment Patient Details Name: Dylan Quinn MRN: [id number] DOB: 14-Sep-1936 Today's Date: 12/16/2016    History of Present Illness 80 yo male admitted with diabetic ketoacidosis, +flu. Hx of DM, HTN, peripheral neuropathy, osteoporosis, prostate ca.    PT Comments    Pt not really progressing. He required Min assist for mobility on today. He was only able to tolerate short distance ambulation in room- ~15' x2 with the RW. No family present during session. Discussed d/c plan-pt didn't feel his wife could manage with him at home in his current state. When asked if he thought he needed rehab before returning home, pt shook his head "no". Feel pt may need a CSW consult if he doesn't progress over next day or so. Recommend daily mobility with nursing assistance (per pt, he has not been OOB since last PT session).    Follow Up Recommendations  Home health PT;Supervision/Assistance - 24 hour vs SNF (may need to consider SNF if pt doesn't progress)     Equipment Recommendations  None recommended by PT    Recommendations for Other Services       Precautions / Restrictions Precautions Precautions: Fall Precaution Comments: droplet Restrictions Weight Bearing Restrictions: No    Mobility  Bed Mobility Overal bed mobility: Needs Assistance Bed Mobility: Supine to Sit;Sit to Supine     Supine to sit: Min guard Sit to supine: Min guard   General bed mobility comments: close guard for safety. Increased time  Transfers Overall transfer level: Needs assistance Equipment used: Standard walker Transfers: Sit to/from Stand Sit to Stand: Min assist         General transfer comment: x2. Assist to rise, stabilize, control descent.   Ambulation/Gait Ambulation/Gait assistance: Min assist Ambulation Distance (Feet): 15 Feet (x2) Assistive device: Rolling walker (2 wheeled) Gait Pattern/deviations: Step-through pattern;Decreased stride length;Trunk flexed      General Gait Details: Assist to stabilize. Generally weak and fatigues quickly and easily. Seated reat break needed between short walks   Stairs            Wheelchair Mobility    Modified Rankin (Stroke Patients Only)       Balance Overall balance assessment: Needs assistance           Standing balance-Leahy Scale: Poor                      Cognition Arousal/Alertness: Awake/alert (but very fatigued) Behavior During Therapy: WFL for tasks assessed/performed Overall Cognitive Status: Within Functional Limits for tasks assessed                      Exercises      General Comments        Pertinent Vitals/Pain Pain Assessment: No/denies pain    Home Living                      Prior Function            PT Goals (current goals can now be found in the care plan section) Progress towards PT goals: Not progressing toward goals - comment (remains weak)    Frequency    Min 3X/week      PT Plan Current plan remains appropriate    Co-evaluation             End of Session   Activity Tolerance: Patient limited by fatigue Patient left: in bed;with call bell/phone within reach;with bed alarm set  Time: [id number] PT Time Calculation (min) (ACUTE ONLY): 28 min  Charges:  $Gait Training: 8-22 mins $Therapeutic Activity: 8-22 mins                    G Codes:      Weston Anna, MPT Pager: [phone]

## 2016-12-16 NOTE — Progress Notes (Signed)
PROGRESS NOTE    Dylan Quinn  [id number] DOB: 31-Jul-1936 DOA: 12/11/2016 PCP: Tracie Harrier, MD    Brief Narrative:  80 y/o with history of HTN and DM who presented to the hospital with + influenza A and DKA. Patient DKA resolved and influenza was treated with 5 days of tamiflu.  On 12/22 patient had 1 episode of possible hemoptysis and then another episode on 12/23.  Sputum culture was sent.  Patient underwent chest xray and showed possible pneumonia or hemorrhage.  Patient was then started on antibiotics for pneumonia.    Assessment & Plan:   Principal Problem:   Diabetic ketoacidosis without coma associated with type 1 diabetes mellitus ([place]) Active Problems:   Essential hypertension   URI (upper respiratory infection)   Nausea & vomiting   DKA, type 1 (HCC)   Diabetic ketoacidosis without coma associated with type 1 diabetes mellitus ([place]) - successfully transitioned to subcutaneous insulin, Will increase Lantus dose given elevated blood sugars - lantus to 32 units qAM - Diabetic diet - Antiemetic when necessary for nausea, resolving  Hemoptysis - sputum culture pending - repeat episode - CXR showing possible pneumonia vs hemorrhage - discussed with Elink and treating for pneumonia - pneumonia order set utilized for HCAP as patient has been hospitalized for past 5 days  Hypokalemia - low again this am - repleted again - will continue IV KCl as patient not able to swallow large pills - magnesium low at 1.5- 2g IV ordered    Essential hypertension - Continue ARB and HCTZ  Influenza A - Continue Tamiflu and supportive therapy - today is last day    Nausea & vomiting - secondary to principle problem most likely - improved with improvement in labs  Low UOP - improvement in UOP yesterday - will continue to follow   DVT prophylaxis: holding lovenox 2/2 hemoptysis; can restart as H/H stable Code Status: Full Family Communication: none at  bedside Disposition Plan: pending patient improvement- may need SNF for short term rehab    Consultants:   PT  Diabetes Coordinator  Procedures:   None  Antimicrobials:   Tamiflu    Subjective: Patient reports today he feels a slight bit better.  Mentions he feels slightly less tired.  Using incentive spirometer and is able to achieve 1954ml.  Cough is about the same.  Did not eat much breakfast as patient states that he does not like the eggs and breakfast here and the eggs are overdone. Says he will try to eat lunch.   Objective: Vitals:   12/15/16 2116 12/16/16 0635 12/16/16 0825 12/16/16 1335  BP: (!) 148/87 (!) 155/66  (!) 146/71  Pulse: (!) 104 88  93  Resp: (!) 22 20  18   Temp: 98.3 F (36.8 C) 98.5 F (36.9 C) 99 F (37.2 C) 99.6 F (37.6 C)  TempSrc: Oral Oral Oral Oral  SpO2: 93% 92%  95%  Weight:      Height:        Intake/Output Summary (Last 24 hours) at 12/16/16 1501 Last data filed at 12/16/16 0636  Gross per 24 hour  Intake          1240.32 ml  Output             1350 ml  Net          -109.68 ml   Filed Weights   12/11/16 0159  Weight: 97.5 kg (215 lb)    Examination:  General exam: Appears calm and comfortable  Respiratory system:  Respiratory effort normal. Few crackles scattered throughout lung fields. Cardiovascular system: S1 & S2 heard, RRR. No JVD, murmurs, rubs, gallops or clicks. No pedal edema. Gastrointestinal system: Abdomen is nondistended, soft and nontender. No organomegaly or masses felt. Normal bowel sounds heard. Central nervous system: Alert and oriented. No focal neurological deficits. Extremities: Symmetric 5 x 5 power. Skin: No rashes, lesions or ulcers Psychiatry: Judgement and insight appear normal. Mood & affect appropriate.     Data Reviewed: I have personally reviewed following labs and imaging studies  CBC:  Recent Labs Lab 12/09/16 1603 12/11/16 0235 12/11/16 0802 12/15/16 1857  WBC 6.9 7.0 6.6  12.7*  HGB 12.8* 13.0 11.5* 12.2*  HCT 37.7* 39.1 32.9* 34.9*  MCV 92.7 90.7 88.2 86.4  PLT 177 194 177 0000000   Basic Metabolic Panel:  Recent Labs Lab 12/14/16 0523 12/14/16 1345 12/15/16 0606 12/15/16 1857 12/16/16 0545 12/16/16 1102  NA 130* 130* 131* 132*  --  132*  K 2.8* 3.3* 3.1* 2.5* 2.8* 3.0*  CL 93* 95* 97* 94*  --  95*  CO2 25 24 24 26   --  27  GLUCOSE 273* 251* 242* 115*  --  216*  BUN 22* 24* 16 11  --  15  CREATININE 1.10 1.10 0.88 0.76  --  1.14  CALCIUM 8.7* 8.4* 8.0* 8.3*  --  7.8*  MG  --   --   --   --   --  1.5*   GFR: Estimated Creatinine Clearance: 64.7 mL/min (by C-G formula based on SCr of 1.14 mg/dL). Liver Function Tests:  Recent Labs Lab 12/09/16 1603 12/11/16 0235  AST 15 21  ALT 10* 14*  ALKPHOS 126 130*  BILITOT 0.9 0.8  PROT 6.7 7.3  ALBUMIN 3.8 3.9    Recent Labs Lab 12/09/16 1603 12/11/16 0235  LIPASE <10* 18   No results for input(s): AMMONIA in the last 168 hours. Coagulation Profile: No results for input(s): INR, PROTIME in the last 168 hours. Cardiac Enzymes:  Recent Labs Lab 12/09/16 1603 12/11/16 0600 12/11/16 0802  TROPONINI <0.03 QUESTIONABLE RESULTS, RECOMMEND RECOLLECT TO VERIFY 0.04*   BNP (last 3 results) No results for input(s): PROBNP in the last 8760 hours. HbA1C: No results for input(s): HGBA1C in the last 72 hours. CBG:  Recent Labs Lab 12/15/16 2147 12/15/16 2341 12/16/16 0122 12/16/16 0743 12/16/16 1217  GLUCAP 125* 110* 119* 190* 212*   Lipid Profile: No results for input(s): CHOL, HDL, LDLCALC, TRIG, CHOLHDL, LDLDIRECT in the last 72 hours. Thyroid Function Tests: No results for input(s): TSH, T4TOTAL, FREET4, T3FREE, THYROIDAB in the last 72 hours. Anemia Panel: No results for input(s): VITAMINB12, FOLATE, FERRITIN, TIBC, IRON, RETICCTPCT in the last 72 hours. Sepsis Labs: No results for input(s): PROCALCITON, LATICACIDVEN in the last 168 hours.  Recent Results (from the past 240  hour(s))  MRSA PCR Screening     Status: None   Collection Time: 12/11/16  5:29 AM  Result Value Ref Range Status   MRSA by PCR NEGATIVE NEGATIVE Final    Comment:        The GeneXpert MRSA Assay (FDA approved for NASAL specimens only), is one component of a comprehensive MRSA colonization surveillance program. It is not intended to diagnose MRSA infection nor to guide or monitor treatment for MRSA infections.   Culture, expectorated sputum-assessment     Status: None   Collection Time: 12/15/16  7:03 AM  Result Value Ref Range Status   Specimen  Description SPUTUM  Final   Special Requests Normal  Final   Sputum evaluation THIS SPECIMEN IS ACCEPTABLE FOR SPUTUM CULTURE  Final   Report Status 12/15/2016 FINAL  Final         Radiology Studies: Dg Chest 1 View  Result Date: 12/15/2016 CLINICAL DATA:  Influenza a, DKA, cough, hemoptysis, history prostate cancer, hypertension, type I diabetes mellitus EXAM: CHEST 1 VIEW COMPARISON:  Portable exam 1656 hours compared to 12/11/2016 FINDINGS: Normal heart size, mediastinal contours, and pulmonary vascularity. Minimal patchy infiltrate in LEFT mid lung. More significant infiltrate identified at RIGHT lung base, likely RIGHT middle lobe. Remaining lungs clear. No pleural effusion or pneumothorax. Bones demineralized. IMPRESSION: BILATERAL pulmonary infiltrates greatest at RIGHT middle lobe, question pneumonia though pulmonary hemorrhage could cause a similar appearance. Electronically Signed   By: Lavonia Dana M.D.   On: 12/15/2016 18:12        Scheduled Meds: . brimonidine  1 drop Both Eyes BID   And  . timolol  1 drop Both Eyes BID  . calcium carbonate  400 mg of elemental calcium Oral Daily  . ceFEPime (MAXIPIME) IV  1 g Intravenous Q8H  . dorzolamide  1 drop Both Eyes BID  . feeding supplement (GLUCERNA SHAKE)  237 mL Oral TID BM  . guaiFENesin  600 mg Oral BID  . hydrochlorothiazide  25 mg Oral Daily  . insulin aspart   0-15 Units Subcutaneous TID WC  . insulin aspart  0-5 Units Subcutaneous QHS  . insulin glargine  30 Units Subcutaneous Daily  . irbesartan  300 mg Oral Daily  . latanoprost  1 drop Both Eyes QHS  . pregabalin  100 mg Oral BID  . vancomycin  1,250 mg Intravenous Q12H   Continuous Infusions: . sodium chloride 75 mL/hr at 12/16/16 0624     LOS: 3 days    Time spent: 30 minutes    Loretha Stapler, MD [facility] Pager [phone]  If 7PM-7AM, please contact night-coverage [email or web address] Password [email or web address] 12/16/2016, 3:01 PM

## 2016-12-17 DIAGNOSIS — J1100 Influenza due to unidentified influenza virus with unspecified type of pneumonia: Secondary | ICD-10-CM

## 2016-12-17 DIAGNOSIS — Z9049 Acquired absence of other specified parts of digestive tract: Secondary | ICD-10-CM

## 2016-12-17 DIAGNOSIS — E876 Hypokalemia: Secondary | ICD-10-CM

## 2016-12-17 DIAGNOSIS — J189 Pneumonia, unspecified organism: Secondary | ICD-10-CM

## 2016-12-17 DIAGNOSIS — Z800 Family history of malignant neoplasm of digestive organs: Secondary | ICD-10-CM

## 2016-12-17 DIAGNOSIS — Z8249 Family history of ischemic heart disease and other diseases of the circulatory system: Secondary | ICD-10-CM

## 2016-12-17 DIAGNOSIS — Z888 Allergy status to other drugs, medicaments and biological substances status: Secondary | ICD-10-CM

## 2016-12-17 DIAGNOSIS — R7881 Bacteremia: Secondary | ICD-10-CM

## 2016-12-17 LAB — BASIC METABOLIC PANEL
ANION GAP: 9 (ref 5–15)
ANION GAP: 7 (ref 5–15)
Anion gap: 7 (ref 5–15)
BUN: 13 mg/dL (ref 6–20)
BUN: 15 mg/dL (ref 6–20)
BUN: 14 mg/dL (ref 6–20)
CALCIUM: 7.9 mg/dL — AB (ref 8.9–10.3)
CO2: 27 mmol/L (ref 22–32)
CO2: 26 mmol/L (ref 22–32)
CO2: 26 mmol/L (ref 22–32)
CREATININE: 1.21 mg/dL (ref 0.61–1.24)
Calcium: 7.9 mg/dL — ABNORMAL LOW (ref 8.9–10.3)
Calcium: 7.7 mg/dL — ABNORMAL LOW (ref 8.9–10.3)
Chloride: 98 mmol/L — ABNORMAL LOW (ref 101–111)
Chloride: 98 mmol/L — ABNORMAL LOW (ref 101–111)
Chloride: 100 mmol/L — ABNORMAL LOW (ref 101–111)
Creatinine, Ser: 1.14 mg/dL (ref 0.61–1.24)
Creatinine, Ser: 1.13 mg/dL (ref 0.61–1.24)
GFR calc Af Amer: >60 mL/min (ref >60)
GFR calc Af Amer: >60 mL/min (ref >60)
GFR calc non Af Amer: 60 mL/min — ABNORMAL LOW (ref >60)
GFR, EST NON AFRICAN AMERICAN: 56 mL/min — AB (ref >60)
GLUCOSE: 277 mg/dL — AB (ref 65–99)
GLUCOSE: 173 mg/dL — AB (ref 65–99)
Glucose, Bld: 162 mg/dL — ABNORMAL HIGH (ref 65–99)
POTASSIUM: 3.1 mmol/L — AB (ref 3.5–5.1)
POTASSIUM: 3.2 mmol/L — AB (ref 3.5–5.1)
Potassium: 2.6 mmol/L — CL (ref 3.5–5.1)
SODIUM: 132 mmol/L — AB (ref 135–145)
SODIUM: 133 mmol/L — AB (ref 135–145)
Sodium: 133 mmol/L — ABNORMAL LOW (ref 135–145)

## 2016-12-17 LAB — BLOOD CULTURE ID PANEL (REFLEXED)
ACINETOBACTER BAUMANNII: NOT DETECTED
CANDIDA ALBICANS: NOT DETECTED
CANDIDA GLABRATA: NOT DETECTED
CANDIDA TROPICALIS: NOT DETECTED
Candida krusei: NOT DETECTED
Candida parapsilosis: NOT DETECTED
ENTEROBACTER CLOACAE COMPLEX: NOT DETECTED
ENTEROBACTERIACEAE SPECIES: NOT DETECTED
ENTEROCOCCUS SPECIES: NOT DETECTED
Escherichia coli: NOT DETECTED
HAEMOPHILUS INFLUENZAE: NOT DETECTED
KLEBSIELLA PNEUMONIAE: NOT DETECTED
Klebsiella oxytoca: NOT DETECTED
LISTERIA MONOCYTOGENES: NOT DETECTED
METHICILLIN RESISTANCE: DETECTED — AB
NEISSERIA MENINGITIDIS: NOT DETECTED
Proteus species: NOT DETECTED
Pseudomonas aeruginosa: NOT DETECTED
STAPHYLOCOCCUS SPECIES: DETECTED — AB
STREPTOCOCCUS AGALACTIAE: NOT DETECTED
STREPTOCOCCUS PYOGENES: NOT DETECTED
STREPTOCOCCUS SPECIES: NOT DETECTED
Serratia marcescens: NOT DETECTED
Staphylococcus aureus (BCID): DETECTED — AB
Streptococcus pneumoniae: NOT DETECTED

## 2016-12-17 LAB — CBC WITH DIFFERENTIAL/PLATELET
BASOS ABS: 0 10*3/uL (ref 0.0–0.1)
Basophils Relative: 0 %
EOS ABS: 0 10*3/uL (ref 0.0–0.7)
EOS PCT: 0 %
HCT: 31.6 % — ABNORMAL LOW (ref 39.0–52.0)
Hemoglobin: 11.3 g/dL — ABNORMAL LOW (ref 13.0–17.0)
LYMPHS PCT: 9 %
Lymphs Abs: 1.3 10*3/uL (ref 0.7–4.0)
MCH: 31 pg (ref 26.0–34.0)
MCHC: 35.8 g/dL (ref 30.0–36.0)
MCV: 86.8 fL (ref 78.0–100.0)
MONO ABS: 1.4 10*3/uL — AB (ref 0.1–1.0)
Monocytes Relative: 10 %
Neutro Abs: 11 10*3/uL — ABNORMAL HIGH (ref 1.7–7.7)
Neutrophils Relative %: 81 %
PLATELETS: 241 10*3/uL (ref 150–400)
RBC: 3.64 MIL/uL — AB (ref 4.22–5.81)
RDW: 14.1 % (ref 11.5–15.5)
WBC: 13.6 10*3/uL — AB (ref 4.0–10.5)

## 2016-12-17 LAB — GLUCOSE, CAPILLARY
GLUCOSE-CAPILLARY: 148 mg/dL — AB (ref 65–99)
GLUCOSE-CAPILLARY: 113 mg/dL — AB (ref 65–99)
Glucose-Capillary: 188 mg/dL — ABNORMAL HIGH (ref 65–99)
Glucose-Capillary: 269 mg/dL — ABNORMAL HIGH (ref 65–99)

## 2016-12-17 LAB — MAGNESIUM: Magnesium: 1.9 mg/dL (ref 1.7–2.4)

## 2016-12-17 MED ORDER — POTASSIUM CHLORIDE CRYS ER 20 MEQ PO TBCR
40.0000 meq | EXTENDED_RELEASE_TABLET | Freq: Four times a day (QID) | ORAL | Status: DC
  Administered 2016-12-17 – 2016-12-18 (×5): 40 meq via ORAL
  Filled 2016-12-17 (×5): qty 2

## 2016-12-17 MED ORDER — POTASSIUM CHLORIDE CRYS ER 20 MEQ PO TBCR: 40.0000 meq | EXTENDED_RELEASE_TABLET | Freq: Two times a day (BID) | ORAL | Status: DC

## 2016-12-17 MED ORDER — SODIUM CHLORIDE 0.9 % IV SOLN: 30.0000 meq | Freq: Once | INTRAVENOUS | Status: DC

## 2016-12-17 MED ORDER — POTASSIUM CHLORIDE CRYS ER 20 MEQ PO TBCR: 40.0000 meq | EXTENDED_RELEASE_TABLET | ORAL | Status: DC

## 2016-12-17 MED ORDER — POTASSIUM CHLORIDE CRYS ER 20 MEQ PO TBCR: 40.0000 meq | EXTENDED_RELEASE_TABLET | Freq: Three times a day (TID) | ORAL | Status: DC

## 2016-12-17 MED ORDER — ENOXAPARIN SODIUM 40 MG/0.4ML ~~LOC~~ SOLN
40.0000 mg | Freq: Every day | SUBCUTANEOUS | Status: DC
  Administered 2016-12-17 – 2016-12-23 (×6): 40 mg via SUBCUTANEOUS
  Filled 2016-12-17 (×6): qty 0.4

## 2016-12-17 MED ORDER — ZOLPIDEM TARTRATE 5 MG PO TABS
5.0000 mg | ORAL_TABLET | Freq: Once | ORAL | Status: AC
  Administered 2016-12-17: 5 mg via ORAL
  Filled 2016-12-17: qty 1

## 2016-12-17 MED ORDER — POTASSIUM CHLORIDE 10 MEQ/100ML IV SOLN
10.0000 meq | INTRAVENOUS | Status: AC
  Administered 2016-12-17 (×3): 10 meq via INTRAVENOUS
  Filled 2016-12-17 (×3): qty 100

## 2016-12-17 MED ORDER — POTASSIUM CHLORIDE 20 MEQ/15ML (10%) PO SOLN
40.0000 meq | ORAL | Status: DC
  Administered 2016-12-17: 40 meq via ORAL
  Filled 2016-12-17: qty 30

## 2016-12-17 MED ORDER — ONDANSETRON 4 MG PO TBDP: 4.0000 mg | ORAL_TABLET | Freq: Three times a day (TID) | ORAL | Status: DC | PRN

## 2016-12-17 NOTE — Progress Notes (Signed)
Brief Pharmacy note: Lovenox for VTE prophylaxis  Lovenox discontinued 12/23 with hemoptysis, since resolved  H/H low, stable with Plt increased at 241K  Begin Lovenox 40mg  SQ q24  Monitor s/s bleeding, CBC  Dylan Quinn PharmD Pager [phone] 12/17/2016, 8:59 AM

## 2016-12-17 NOTE — Progress Notes (Signed)
PHARMACY - PHYSICIAN COMMUNICATION CRITICAL VALUE ALERT - BLOOD CULTURE IDENTIFICATION (BCID)  Results for orders placed or performed during the hospital encounter of 12/11/16  Blood Culture ID Panel (Reflexed) (Collected: 12/15/2016  7:14 PM)  Result Value Ref Range   Enterococcus species NOT DETECTED NOT DETECTED   Listeria monocytogenes NOT DETECTED NOT DETECTED   Staphylococcus species DETECTED (A) NOT DETECTED   Staphylococcus aureus DETECTED (A) NOT DETECTED   Methicillin resistance DETECTED (A) NOT DETECTED   Streptococcus species NOT DETECTED NOT DETECTED   Streptococcus agalactiae NOT DETECTED NOT DETECTED   Streptococcus pneumoniae NOT DETECTED NOT DETECTED   Streptococcus pyogenes NOT DETECTED NOT DETECTED   Acinetobacter baumannii NOT DETECTED NOT DETECTED   Enterobacteriaceae species NOT DETECTED NOT DETECTED   Enterobacter cloacae complex NOT DETECTED NOT DETECTED   Escherichia coli NOT DETECTED NOT DETECTED   Klebsiella oxytoca NOT DETECTED NOT DETECTED   Klebsiella pneumoniae NOT DETECTED NOT DETECTED   Proteus species NOT DETECTED NOT DETECTED   Serratia marcescens NOT DETECTED NOT DETECTED   Haemophilus influenzae NOT DETECTED NOT DETECTED   Neisseria meningitidis NOT DETECTED NOT DETECTED   Pseudomonas aeruginosa NOT DETECTED NOT DETECTED   Candida albicans NOT DETECTED NOT DETECTED   Candida glabrata NOT DETECTED NOT DETECTED   Candida krusei NOT DETECTED NOT DETECTED   Candida parapsilosis NOT DETECTED NOT DETECTED   Candida tropicalis NOT DETECTED NOT DETECTED   Name of physician (or Provider) Contacted: [place]  Changes to prescribed antibiotics required: no changes to current Vancomycin dosing  Minda Ditto PharmD Pager [phone] 12/17/2016, 8:11 AM

## 2016-12-17 NOTE — Progress Notes (Signed)
PROGRESS NOTE    Dylan Quinn  [id number] DOB: 1936/07/27 DOA: 12/11/2016 PCP: Tracie Harrier, MD    Brief Narrative:  80 y/o with history of HTN and DM who presented to the hospital with + influenza A and DKA. Patient DKA resolved and influenza was treated with 5 days of tamiflu.  On 12/22 patient had 1 episode of possible hemoptysis and then another episode on 12/23.  Sputum culture was sent.  Patient underwent chest xray and showed possible pneumonia or hemorrhage.  Patient was then started on antibiotics for pneumonia.    Assessment & Plan:   Principal Problem:   Diabetic ketoacidosis without coma associated with type 1 diabetes mellitus ([place]) Active Problems:   Essential hypertension   URI (upper respiratory infection)   Nausea & vomiting   DKA, type 1 (HCC)  Bacteremia with MRSA - MRSA + - patient on Vancomycin for HCAP - infectious disease reflex consult   Diabetic ketoacidosis without coma associated with type 1 diabetes mellitus ([place]) - successfully transitioned to subcutaneous insulin, Will increase Lantus dose given elevated blood sugars - lantus to 32 units qAM - Diabetic diet - Antiemetic when necessary for nausea - fasting glucose of 188 this am  HCAP - treating with Vancomycin and Cefepime - respiratory status is stable- not requiring oxygen, not tachypneic - continue current treatment  Hemoptysis - sputum culture pending - CXR showing possible pneumonia vs hemorrhage - discussed with Elink and treating for pneumonia - pneumonia order set utilized for HCAP as patient has been hospitalized for past 5 days  Hypokalemia - remains consistently low - discussed with on call nephrology who encourages increasing frequency of PO potassium supplementation - repeat BMP and Mg pending - magnesium low at 1.5- 2g IV ordered yesterday - 47mEq q6h ordered today    Essential hypertension - Continue ARB - will add PRN hydralazine   Influenza A -  completed course of tamiflu    Nausea & vomiting - nausea still affecting patient - Zofran PRN   DVT prophylaxis: restarting lovenox as H/H stable Code Status: Full Family Communication: no family bedside- talked to patient's daughter on the phone Disposition Plan: pending patient improvement- may need SNF for short term rehab    Consultants:   PT  Diabetes Coordinator  Infectious Disease  Procedures:   None  Antimicrobials:   Tamiflu   Cefepime 12/23>  Vancomycin 12/23>    Subjective: Patient says again that today he feels slightly better today than yesterday.  Says he was having lots of all over body aches yesterday but is feeling better.  Felt better after working with PT yesterday.  Says he does not have abdominal pain, chest pain, chest pressure.  Still feels nauseous.     Objective: Vitals:   12/16/16 0825 12/16/16 1335 12/16/16 2200 12/17/16 0600  BP:  (!) 146/71 (!) 160/67 (!) 160/82  Pulse:  93 87 84  Resp:  18 18 18   Temp: 99 F (37.2 C) 99.6 F (37.6 C) 99.8 F (37.7 C) 99.1 F (37.3 C)  TempSrc: Oral Oral Oral Oral  SpO2:  95% 93% 92%  Weight:      Height:        Intake/Output Summary (Last 24 hours) at 12/17/16 1132 Last data filed at 12/17/16 0920  Gross per 24 hour  Intake          2350.83 ml  Output              950 ml  Net  1400.83 ml   Filed Weights   12/11/16 0159  Weight: 97.5 kg (215 lb)    Examination:  General exam: Appears calm and comfortable  Respiratory system:  Respiratory effort normal. Few crackles scattered throughout lung fields. Cardiovascular system: S1 & S2 heard, RRR. No JVD, murmurs, rubs, gallops or clicks. No pedal edema. Gastrointestinal system: Abdomen is nondistended, soft and nontender. No organomegaly or masses felt. Normal bowel sounds heard. Central nervous system: Alert and oriented. No focal neurological deficits. Extremities: Symmetric 5 x 5 power. Skin: No rashes, lesions or  ulcers Psychiatry: Judgement and insight appear normal. Mood & affect appropriate.     Data Reviewed: I have personally reviewed following labs and imaging studies  CBC:  Recent Labs Lab 12/11/16 0235 12/11/16 0802 12/15/16 1857 12/17/16 0512  WBC 7.0 6.6 12.7* 13.6*  NEUTROABS  --   --   --  11.0*  HGB 13.0 11.5* 12.2* 11.3*  HCT 39.1 32.9* 34.9* 31.6*  MCV 90.7 88.2 86.4 86.8  PLT 194 177 195 [id number]   Basic Metabolic Panel:  Recent Labs Lab 12/15/16 0606 12/15/16 1857 12/16/16 0545 12/16/16 1102 12/16/16 1654 12/17/16 0512  NA 131* 132*  --  132* 133* 132*  K 3.1* 2.5* 2.8* 3.0* 2.8* 2.6*  CL 97* 94*  --  95* 97* 98*  CO2 24 26  --  27 27 27   GLUCOSE 242* 115*  --  216* 128* 162*  BUN 16 11  --  15 15 13   CREATININE 0.88 0.76  --  1.14 1.20 1.21  CALCIUM 8.0* 8.3*  --  7.8* 8.0* 7.9*  MG  --   --   --  1.5*  --   --    GFR: Estimated Creatinine Clearance: 60.9 mL/min (by C-G formula based on SCr of 1.21 mg/dL). Liver Function Tests:  Recent Labs Lab 12/11/16 0235  AST 21  ALT 14*  ALKPHOS 130*  BILITOT 0.8  PROT 7.3  ALBUMIN 3.9    Recent Labs Lab 12/11/16 0235  LIPASE 18   No results for input(s): AMMONIA in the last 168 hours. Coagulation Profile: No results for input(s): INR, PROTIME in the last 168 hours. Cardiac Enzymes:  Recent Labs Lab 12/11/16 0600 12/11/16 0802  TROPONINI QUESTIONABLE RESULTS, RECOMMEND RECOLLECT TO VERIFY 0.04*   BNP (last 3 results) No results for input(s): PROBNP in the last 8760 hours. HbA1C: No results for input(s): HGBA1C in the last 72 hours. CBG:  Recent Labs Lab 12/16/16 0743 12/16/16 1217 12/16/16 1701 12/16/16 2156 12/17/16 0810  GLUCAP 190* 212* 121* 108* 188*   Lipid Profile: No results for input(s): CHOL, HDL, LDLCALC, TRIG, CHOLHDL, LDLDIRECT in the last 72 hours. Thyroid Function Tests: No results for input(s): TSH, T4TOTAL, FREET4, T3FREE, THYROIDAB in the last 72 hours. Anemia  Panel: No results for input(s): VITAMINB12, FOLATE, FERRITIN, TIBC, IRON, RETICCTPCT in the last 72 hours. Sepsis Labs: No results for input(s): PROCALCITON, LATICACIDVEN in the last 168 hours.  Recent Results (from the past 240 hour(s))  MRSA PCR Screening     Status: None   Collection Time: 12/11/16  5:29 AM  Result Value Ref Range Status   MRSA by PCR NEGATIVE NEGATIVE Final    Comment:        The GeneXpert MRSA Assay (FDA approved for NASAL specimens only), is one component of a comprehensive MRSA colonization surveillance program. It is not intended to diagnose MRSA infection nor to guide or monitor treatment for MRSA infections.  Culture, expectorated sputum-assessment     Status: None   Collection Time: 12/15/16  7:03 AM  Result Value Ref Range Status   Specimen Description SPUTUM  Final   Special Requests Normal  Final   Sputum evaluation THIS SPECIMEN IS ACCEPTABLE FOR SPUTUM CULTURE  Final   Report Status 12/15/2016 FINAL  Final  Culture, blood (routine x 2) Call MD if unable to obtain prior to antibiotics being given     Status: None (Preliminary result)   Collection Time: 12/15/16  7:14 PM  Result Value Ref Range Status   Specimen Description BLOOD RIGHT HAND  Final   Special Requests IN PEDIATRIC BOTTLE 1.5CC  Final   Culture  Setup Time   Final    PED GRAM POSITIVE COCCI IN CLUSTERS Organism ID to follow CRITICAL RESULT CALLED TO, READ BACK BY AND VERIFIED WITH: T. GREEN, PHARM AT 0754 ON [id number] BY Rhea Bleacher    Culture   Final    TOO YOUNG TO READ Performed at [facility]    Report Status PENDING  Incomplete  Blood Culture ID Panel (Reflexed)     Status: Abnormal   Collection Time: 12/15/16  7:14 PM  Result Value Ref Range Status   Enterococcus species NOT DETECTED NOT DETECTED Final   Listeria monocytogenes NOT DETECTED NOT DETECTED Final   Staphylococcus species DETECTED (A) NOT DETECTED Final    Comment: CRITICAL RESULT CALLED TO, READ  BACK BY AND VERIFIED WITH: T. GREEN, PHARM AT 0754 ON [id number] BY S. YARBROUGH    Staphylococcus aureus DETECTED (A) NOT DETECTED Final    Comment: CRITICAL RESULT CALLED TO, READ BACK BY AND VERIFIED WITH: T. GREEN, PHARM AT 0754 ON [id number] BY S. YARBROUGH    Methicillin resistance DETECTED (A) NOT DETECTED Final    Comment: CRITICAL RESULT CALLED TO, READ BACK BY AND VERIFIED WITH: T. GREEN, PHARM AT 0754 ON [id number] BY S. YARBROUGH    Streptococcus species NOT DETECTED NOT DETECTED Final   Streptococcus agalactiae NOT DETECTED NOT DETECTED Final   Streptococcus pneumoniae NOT DETECTED NOT DETECTED Final   Streptococcus pyogenes NOT DETECTED NOT DETECTED Final   Acinetobacter baumannii NOT DETECTED NOT DETECTED Final   Enterobacteriaceae species NOT DETECTED NOT DETECTED Final   Enterobacter cloacae complex NOT DETECTED NOT DETECTED Final   Escherichia coli NOT DETECTED NOT DETECTED Final   Klebsiella oxytoca NOT DETECTED NOT DETECTED Final   Klebsiella pneumoniae NOT DETECTED NOT DETECTED Final   Proteus species NOT DETECTED NOT DETECTED Final   Serratia marcescens NOT DETECTED NOT DETECTED Final   Haemophilus influenzae NOT DETECTED NOT DETECTED Final   Neisseria meningitidis NOT DETECTED NOT DETECTED Final   Pseudomonas aeruginosa NOT DETECTED NOT DETECTED Final   Candida albicans NOT DETECTED NOT DETECTED Final   Candida glabrata NOT DETECTED NOT DETECTED Final   Candida krusei NOT DETECTED NOT DETECTED Final   Candida parapsilosis NOT DETECTED NOT DETECTED Final   Candida tropicalis NOT DETECTED NOT DETECTED Final    Comment: Performed at [facility]         Radiology Studies: Dg Chest 1 View  Result Date: 12/15/2016 CLINICAL DATA:  Influenza a, DKA, cough, hemoptysis, history prostate cancer, hypertension, type I diabetes mellitus EXAM: CHEST 1 VIEW COMPARISON:  Portable exam 1656 hours compared to 12/11/2016 FINDINGS: Normal heart size, mediastinal contours, and  pulmonary vascularity. Minimal patchy infiltrate in LEFT mid lung. More significant infiltrate identified at RIGHT lung base, likely RIGHT middle lobe. Remaining lungs  clear. No pleural effusion or pneumothorax. Bones demineralized. IMPRESSION: BILATERAL pulmonary infiltrates greatest at RIGHT middle lobe, question pneumonia though pulmonary hemorrhage could cause a similar appearance. Electronically Signed   By: Lavonia Dana M.D.   On: 12/15/2016 18:12        Scheduled Meds: . brimonidine  1 drop Both Eyes BID   And  . timolol  1 drop Both Eyes BID  . calcium carbonate  400 mg of elemental calcium Oral Daily  . ceFEPime (MAXIPIME) IV  1 g Intravenous Q8H  . dorzolamide  1 drop Both Eyes BID  . enoxaparin (LOVENOX) injection  40 mg Subcutaneous Daily  . feeding supplement (GLUCERNA SHAKE)  237 mL Oral TID BM  . guaiFENesin  600 mg Oral BID  . insulin aspart  0-15 Units Subcutaneous TID WC  . insulin aspart  0-5 Units Subcutaneous QHS  . insulin glargine  30 Units Subcutaneous Daily  . irbesartan  300 mg Oral Daily  . latanoprost  1 drop Both Eyes QHS  . potassium chloride  40 mEq Oral TID  . pregabalin  100 mg Oral BID  . vancomycin  1,250 mg Intravenous Q12H   Continuous Infusions: . sodium chloride 50 mL/hr (12/17/16 0455)     LOS: 4 days    Time spent: 35 minutes    Loretha Stapler, MD [facility] Pager [phone]  If 7PM-7AM, please contact night-coverage [email or web address] Password [facility] 12/17/2016, 11:32 AM

## 2016-12-17 NOTE — Consult Note (Addendum)
Dylan Quinn for Infectious Disease  Date of Admission:  12/11/2016  Date of Consult:  12/17/2016  Reason for Consult: MRSA bacteremia Referring Physician: CHAMP  Impression/Recommendation MRSA bacteremia Influenza pneumonia  Would stop cefepime Repeat BCx Consider TEE  Comment- Historically, staph pnuemonia was a common superinfection in patients with influenza.  Other sources seem less likely as he denies prosthetics.  He has completed his tamiflu.  He was tested for HIV (-)  Thank you so much for this interesting consult,   Dylan Quinn (pager) [phone] [email or web address]  Dylan Quinn is an 80 y.o. male.  HPI: 80 yo M with DM2 since 1998, adm on 12-19 with recent dx of influenza. He was not started on tamiflu as his sx started more than 2 days prior to presentation.  He was seen by PCP and started on azithro day pta, but then developed n/v, and elevated FSG. He was found to have DKA in ED. He was started on hydration and IV insulin, tamiflu as well. By 12-22 he was changed to scheduled insulin.  By 12-23 pt developed hemoptysis and his CXR was read as RML infiltrate, (pneumonia vs pulmonary hemorrhage).  He was started on anbx for pneumonia (vancomycin/cefepime).  WBC was 6.6 on adm, by 12-25 was 13.6.  Tmax 100.3 (12-21 and 12-23) His BCx have grown 1/2 MRSA (12-23)  Past Medical History:  Diagnosis Date  . BENIGN PROSTATIC HYPERTROPHY 07/19/2009  . COLONIC POLYPS 02/14/2010  . DEPRESSION 03/16/2008  . DIABETES MELLITUS, TYPE I 06/30/2007  . Dyslipidemia   . FOLLICULITIS 05/30/1244  . GLAUCOMA 07/19/2009  . HEARING LOSS 02/14/2010  . HYPERTENSION 05/14/2008  . HYPOGONADISM, MALE 12/16/2007  . Leukopenia   . LUMBAR RADICULOPATHY, LEFT 02/23/2010  . Other osteoporosis 12/16/2007  . PERIPHERAL NEUROPATHY 06/30/2007  . Prostate cancer ([place])   . PROTEINURIA, MILD 02/14/2010  . Psoriasis   . URINARY CALCULUS 12/27/2010    Past Surgical History:    Procedure Laterality Date  . APPENDECTOMY  1986  . CATARACT EXTRACTION  2006  . CATARACT EXTRACTION  1997     Allergies  Allergen Reactions  . Lisinopril Hives    hives    Medications:  Scheduled: . brimonidine  1 drop Both Eyes BID   And  . timolol  1 drop Both Eyes BID  . calcium carbonate  400 mg of elemental calcium Oral Daily  . ceFEPime (MAXIPIME) IV  1 g Intravenous Q8H  . dorzolamide  1 drop Both Eyes BID  . enoxaparin (LOVENOX) injection  40 mg Subcutaneous Daily  . feeding supplement (GLUCERNA SHAKE)  237 mL Oral TID BM  . guaiFENesin  600 mg Oral BID  . insulin aspart  0-15 Units Subcutaneous TID WC  . insulin aspart  0-5 Units Subcutaneous QHS  . insulin glargine  30 Units Subcutaneous Daily  . irbesartan  300 mg Oral Daily  . latanoprost  1 drop Both Eyes QHS  . potassium chloride  40 mEq Oral Q6H  . pregabalin  100 mg Oral BID  . vancomycin  1,250 mg Intravenous Q12H    Abtx:  Anti-infectives    Start     Dose/Rate Route Frequency Ordered Stop   12/16/16 0800  vancomycin (VANCOCIN) 1,250 mg in sodium chloride 0.9 % 250 mL IVPB     1,250 mg 166.7 mL/hr over 90 Minutes Intravenous Every 12 hours 12/15/16 1846     12/15/16 2000  ceFEPIme (MAXIPIME) 1 g in dextrose 5 % 50  mL IVPB     1 g 100 mL/hr over 30 Minutes Intravenous Every 8 hours 12/15/16 1836 12/23/16 1959   12/15/16 1900  vancomycin (VANCOCIN) 2,000 mg in sodium chloride 0.9 % 500 mL IVPB     2,000 mg 250 mL/hr over 120 Minutes Intravenous  Once 12/15/16 1846 12/15/16 2204   12/11/16 1000  oseltamivir (TAMIFLU) capsule 75 mg     75 mg Oral 2 times daily 12/11/16 0737 12/16/16 0959      Total days of antibiotics: 2 vanco/cefepime          Social History:  reports that he has never smoked. He has never used smokeless tobacco. He reports that he does not drink alcohol or use drugs.  Family History  Problem Relation Age of Onset  . Cancer Father     Colon Cancer  . Heart disease Father    . High blood pressure      General ROS: conitnued nausea, normal BM, normal urine, denies sob, occas cough, insomnia, see hpi.   Blood pressure (!) 161/82, pulse 95, temperature 98 F (36.7 C), temperature source Oral, resp. rate 20, height 6' (1.829 m), weight 97.5 kg (215 lb), SpO2 94 %. General appearance: alert, cooperative, fatigued and no distress Eyes: negative findings: conjunctivae and sclerae normal and pupils equal, round, reactive to light and accomodation Throat: normal findings: oropharynx pink & moist without lesions or evidence of thrush Neck: no adenopathy and supple, symmetrical, trachea midline Lungs: rhonchi anterior - bilateral Heart: regular rate and rhythm Abdomen: normal findings: bowel sounds normal and soft, non-tender Extremities: edema none   Results for orders placed or performed during the hospital encounter of 12/11/16 (from the past 48 hour(s))  Glucose, capillary     Status: Abnormal   Collection Time: 12/15/16  4:38 PM  Result Value Ref Range   Glucose-Capillary 170 (H) 65 - 99 mg/dL   Comment 1 Notify RN   HIV antibody     Status: None   Collection Time: 12/15/16  6:57 PM  Result Value Ref Range   HIV Screen 4th Generation wRfx Non Reactive Non Reactive    Comment: (NOTE) Performed At: [facility] [place], [place] [id number] Dylan Quinn HQ:[id number]   CBC     Status: Abnormal   Collection Time: 12/15/16  6:57 PM  Result Value Ref Range   WBC 12.7 (H) 4.0 - 10.5 K/uL   RBC 4.04 (L) 4.22 - 5.81 MIL/uL   Hemoglobin 12.2 (L) 13.0 - 17.0 g/dL   HCT 34.9 (L) 39.0 - 52.0 %   MCV 86.4 78.0 - 100.0 fL   MCH 30.2 26.0 - 34.0 pg   MCHC 35.0 30.0 - 36.0 g/dL   RDW 13.7 11.5 - 15.5 %   Platelets 195 150 - 400 K/uL  Basic metabolic panel     Status: Abnormal   Collection Time: 12/15/16  6:57 PM  Result Value Ref Range   Sodium 132 (L) 135 - 145 mmol/L   Potassium 2.5 (LL) 3.5 - 5.1 mmol/L    Comment: DELTA CHECK  NOTED REPEATED TO VERIFY CRITICAL RESULT CALLED TO, READ BACK BY AND VERIFIED WITH: E BROWN RN 2013 12/15/16 A NAVARRO    Chloride 94 (L) 101 - 111 mmol/L   CO2 26 22 - 32 mmol/L   Glucose, Bld 115 (H) 65 - 99 mg/dL   BUN 11 6 - 20 mg/dL   Creatinine, Ser 0.76 0.61 - 1.24 mg/dL  Calcium 8.3 (L) 8.9 - 10.3 mg/dL   GFR calc non Af Amer >60 >60 mL/min   GFR calc Af Amer >60 >60 mL/min    Comment: (NOTE) The eGFR has been calculated using the CKD EPI equation. This calculation has not been validated in all clinical situations. eGFR's persistently <60 mL/min signify possible Chronic Kidney Disease.    Anion gap 12 5 - 15  Brain natriuretic peptide     Status: Abnormal   Collection Time: 12/15/16  6:57 PM  Result Value Ref Range   B Natriuretic Peptide 201.8 (H) 0.0 - 100.0 pg/mL  Sedimentation rate     Status: Abnormal   Collection Time: 12/15/16  6:57 PM  Result Value Ref Range   Sed Rate 77 (H) 0 - 16 mm/hr  Culture, blood (routine x 2) Call Quinn if unable to obtain prior to antibiotics being given     Status: None (Preliminary result)   Collection Time: 12/15/16  7:14 PM  Result Value Ref Range   Specimen Description BLOOD RIGHT HAND    Special Requests IN PEDIATRIC BOTTLE 1.5CC    Culture  Setup Time      PED GRAM POSITIVE COCCI IN CLUSTERS Organism ID to follow CRITICAL RESULT CALLED TO, READ BACK BY AND VERIFIED WITH: T. GREEN, PHARM AT 0754 ON [id number] BY S. YARBROUGH    Culture      TOO YOUNG TO READ Performed at [facility]    Report Status PENDING   Blood Culture ID Panel (Reflexed)     Status: Abnormal   Collection Time: 12/15/16  7:14 PM  Result Value Ref Range   Enterococcus species NOT DETECTED NOT DETECTED   Listeria monocytogenes NOT DETECTED NOT DETECTED   Staphylococcus species DETECTED (A) NOT DETECTED    Comment: CRITICAL RESULT CALLED TO, READ BACK BY AND VERIFIED WITH: T. GREEN, PHARM AT 0754 ON [id number] BY S. YARBROUGH    Staphylococcus aureus  DETECTED (A) NOT DETECTED    Comment: CRITICAL RESULT CALLED TO, READ BACK BY AND VERIFIED WITH: T. GREEN, PHARM AT 0754 ON [id number] BY S. YARBROUGH    Methicillin resistance DETECTED (A) NOT DETECTED    Comment: CRITICAL RESULT CALLED TO, READ BACK BY AND VERIFIED WITH: T. GREEN, PHARM AT 0754 ON [id number] BY S. YARBROUGH    Streptococcus species NOT DETECTED NOT DETECTED   Streptococcus agalactiae NOT DETECTED NOT DETECTED   Streptococcus pneumoniae NOT DETECTED NOT DETECTED   Streptococcus pyogenes NOT DETECTED NOT DETECTED   Acinetobacter baumannii NOT DETECTED NOT DETECTED   Enterobacteriaceae species NOT DETECTED NOT DETECTED   Enterobacter cloacae complex NOT DETECTED NOT DETECTED   Escherichia coli NOT DETECTED NOT DETECTED   Klebsiella oxytoca NOT DETECTED NOT DETECTED   Klebsiella pneumoniae NOT DETECTED NOT DETECTED   Proteus species NOT DETECTED NOT DETECTED   Serratia marcescens NOT DETECTED NOT DETECTED   Haemophilus influenzae NOT DETECTED NOT DETECTED   Neisseria meningitidis NOT DETECTED NOT DETECTED   Pseudomonas aeruginosa NOT DETECTED NOT DETECTED   Candida albicans NOT DETECTED NOT DETECTED   Candida glabrata NOT DETECTED NOT DETECTED   Candida krusei NOT DETECTED NOT DETECTED   Candida parapsilosis NOT DETECTED NOT DETECTED   Candida tropicalis NOT DETECTED NOT DETECTED    Comment: Performed at [facility]  Glucose, capillary     Status: Abnormal   Collection Time: 12/15/16  9:47 PM  Result Value Ref Range   Glucose-Capillary 125 (H) 65 - 99 mg/dL  Glucose,  capillary     Status: Abnormal   Collection Time: 12/15/16 11:41 PM  Result Value Ref Range   Glucose-Capillary 110 (H) 65 - 99 mg/dL  Glucose, capillary     Status: Abnormal   Collection Time: 12/16/16  1:22 AM  Result Value Ref Range   Glucose-Capillary 119 (H) 65 - 99 mg/dL  Potassium     Status: Abnormal   Collection Time: 12/16/16  5:45 AM  Result Value Ref Range   Potassium 2.8 (L) 3.5 -  5.1 mmol/L  Glucose, capillary     Status: Abnormal   Collection Time: 12/16/16  7:43 AM  Result Value Ref Range   Glucose-Capillary 190 (H) 65 - 99 mg/dL   Comment 1 Notify RN   Magnesium     Status: Abnormal   Collection Time: 12/16/16 11:02 AM  Result Value Ref Range   Magnesium 1.5 (L) 1.7 - 2.4 mg/dL  Basic metabolic panel     Status: Abnormal   Collection Time: 12/16/16 11:02 AM  Result Value Ref Range   Sodium 132 (L) 135 - 145 mmol/L   Potassium 3.0 (L) 3.5 - 5.1 mmol/L   Chloride 95 (L) 101 - 111 mmol/L   CO2 27 22 - 32 mmol/L   Glucose, Bld 216 (H) 65 - 99 mg/dL   BUN 15 6 - 20 mg/dL   Creatinine, Ser 1.14 0.61 - 1.24 mg/dL   Calcium 7.8 (L) 8.9 - 10.3 mg/dL   GFR calc non Af Amer 60 (L) >60 mL/min   GFR calc Af Amer >60 >60 mL/min    Comment: (NOTE) The eGFR has been calculated using the CKD EPI equation. This calculation has not been validated in all clinical situations. eGFR's persistently <60 mL/min signify possible Chronic Kidney Disease.    Anion gap 10 5 - 15  Glucose, capillary     Status: Abnormal   Collection Time: 12/16/16 12:17 PM  Result Value Ref Range   Glucose-Capillary 212 (H) 65 - 99 mg/dL   Comment 1 Notify RN   Basic metabolic panel     Status: Abnormal   Collection Time: 12/16/16  4:54 PM  Result Value Ref Range   Sodium 133 (L) 135 - 145 mmol/L   Potassium 2.8 (L) 3.5 - 5.1 mmol/L   Chloride 97 (L) 101 - 111 mmol/L   CO2 27 22 - 32 mmol/L   Glucose, Bld 128 (H) 65 - 99 mg/dL   BUN 15 6 - 20 mg/dL   Creatinine, Ser 1.20 0.61 - 1.24 mg/dL   Calcium 8.0 (L) 8.9 - 10.3 mg/dL   GFR calc non Af Amer 56 (L) >60 mL/min   GFR calc Af Amer >60 >60 mL/min    Comment: (NOTE) The eGFR has been calculated using the CKD EPI equation. This calculation has not been validated in all clinical situations. eGFR's persistently <60 mL/min signify possible Chronic Kidney Disease.    Anion gap 9 5 - 15  Glucose, capillary     Status: Abnormal    Collection Time: 12/16/16  5:01 PM  Result Value Ref Range   Glucose-Capillary 121 (H) 65 - 99 mg/dL  Glucose, capillary     Status: Abnormal   Collection Time: 12/16/16  9:56 PM  Result Value Ref Range   Glucose-Capillary 108 (H) 65 - 99 mg/dL   Comment 1 Notify RN   Basic metabolic panel     Status: Abnormal   Collection Time: 12/17/16  5:12 AM  Result Value Ref Range  Sodium 132 (L) 135 - 145 mmol/L   Potassium 2.6 (LL) 3.5 - 5.1 mmol/L    Comment: REPEATED TO VERIFY CRITICAL RESULT CALLED TO, READ BACK BY AND VERIFIED WITH: BROWN,E 12.25.17 _0  ZANDO,C    Chloride 98 (L) 101 - 111 mmol/L   CO2 27 22 - 32 mmol/L   Glucose, Bld 162 (H) 65 - 99 mg/dL   BUN 13 6 - 20 mg/dL   Creatinine, Ser 1.21 0.61 - 1.24 mg/dL   Calcium 7.9 (L) 8.9 - 10.3 mg/dL   GFR calc non Af Amer 56 (L) >60 mL/min   GFR calc Af Amer >60 >60 mL/min    Comment: (NOTE) The eGFR has been calculated using the CKD EPI equation. This calculation has not been validated in all clinical situations. eGFR's persistently <60 mL/min signify possible Chronic Kidney Disease.    Anion gap 7 5 - 15  CBC with Differential/Platelet     Status: Abnormal   Collection Time: 12/17/16  5:12 AM  Result Value Ref Range   WBC 13.6 (H) 4.0 - 10.5 K/uL   RBC 3.64 (L) 4.22 - 5.81 MIL/uL   Hemoglobin 11.3 (L) 13.0 - 17.0 g/dL   HCT 31.6 (L) 39.0 - 52.0 %   MCV 86.8 78.0 - 100.0 fL   MCH 31.0 26.0 - 34.0 pg   MCHC 35.8 30.0 - 36.0 g/dL   RDW 14.1 11.5 - 15.5 %   Platelets 241 150 - 400 K/uL   Neutrophils Relative % 81 %   Neutro Abs 11.0 (H) 1.7 - 7.7 K/uL   Lymphocytes Relative 9 %   Lymphs Abs 1.3 0.7 - 4.0 K/uL   Monocytes Relative 10 %   Monocytes Absolute 1.4 (H) 0.1 - 1.0 K/uL   Eosinophils Relative 0 %   Eosinophils Absolute 0.0 0.0 - 0.7 K/uL   Basophils Relative 0 %   Basophils Absolute 0.0 0.0 - 0.1 K/uL  Glucose, capillary     Status: Abnormal   Collection Time: 12/17/16  8:10 AM  Result Value Ref Range    Glucose-Capillary 188 (H) 65 - 99 mg/dL  Glucose, capillary     Status: Abnormal   Collection Time: 12/17/16 12:02 PM  Result Value Ref Range   Glucose-Capillary 269 (H) 65 - 99 mg/dL  Basic metabolic panel     Status: Abnormal   Collection Time: 12/17/16 12:11 PM  Result Value Ref Range   Sodium 133 (L) 135 - 145 mmol/L   Potassium 3.1 (L) 3.5 - 5.1 mmol/L   Chloride 98 (L) 101 - 111 mmol/L   CO2 26 22 - 32 mmol/L   Glucose, Bld 277 (H) 65 - 99 mg/dL   BUN 15 6 - 20 mg/dL   Creatinine, Ser 1.14 0.61 - 1.24 mg/dL   Calcium 7.7 (L) 8.9 - 10.3 mg/dL   GFR calc non Af Amer 60 (L) >60 mL/min   GFR calc Af Amer >60 >60 mL/min    Comment: (NOTE) The eGFR has been calculated using the CKD EPI equation. This calculation has not been validated in all clinical situations. eGFR's persistently <60 mL/min signify possible Chronic Kidney Disease.    Anion gap 9 5 - 15  Magnesium     Status: None   Collection Time: 12/17/16 12:11 PM  Result Value Ref Range   Magnesium 1.9 1.7 - 2.4 mg/dL      Component Value Date/Time   SDES BLOOD RIGHT HAND 12/15/2016 1914   SPECREQUEST IN PEDIATRIC BOTTLE 1.5CC  12/15/2016 1914   CULT  12/15/2016 1914    TOO YOUNG TO READ Performed at [facility] PENDING 12/15/2016 1914   Dg Chest 1 View  Result Date: 12/15/2016 CLINICAL DATA:  Influenza a, DKA, cough, hemoptysis, history prostate cancer, hypertension, type I diabetes mellitus EXAM: CHEST 1 VIEW COMPARISON:  Portable exam 1656 hours compared to 12/11/2016 FINDINGS: Normal heart size, mediastinal contours, and pulmonary vascularity. Minimal patchy infiltrate in LEFT mid lung. More significant infiltrate identified at RIGHT lung base, likely RIGHT middle lobe. Remaining lungs clear. No pleural effusion or pneumothorax. Bones demineralized. IMPRESSION: BILATERAL pulmonary infiltrates greatest at RIGHT middle lobe, question pneumonia though pulmonary hemorrhage could cause a similar  appearance. Electronically Signed   By: Lavonia Dana M.D.   On: 12/15/2016 18:12   Recent Results (from the past 240 hour(s))  MRSA PCR Screening     Status: None   Collection Time: 12/11/16  5:29 AM  Result Value Ref Range Status   MRSA by PCR NEGATIVE NEGATIVE Final    Comment:        The GeneXpert MRSA Assay (FDA approved for NASAL specimens only), is one component of a comprehensive MRSA colonization surveillance program. It is not intended to diagnose MRSA infection nor to guide or monitor treatment for MRSA infections.   Culture, expectorated sputum-assessment     Status: None   Collection Time: 12/15/16  7:03 AM  Result Value Ref Range Status   Specimen Description SPUTUM  Final   Special Requests Normal  Final   Sputum evaluation THIS SPECIMEN IS ACCEPTABLE FOR SPUTUM CULTURE  Final   Report Status 12/15/2016 FINAL  Final  Culture, blood (routine x 2) Call Quinn if unable to obtain prior to antibiotics being given     Status: None (Preliminary result)   Collection Time: 12/15/16  7:14 PM  Result Value Ref Range Status   Specimen Description BLOOD RIGHT HAND  Final   Special Requests IN PEDIATRIC BOTTLE 1.5CC  Final   Culture  Setup Time   Final    PED GRAM POSITIVE COCCI IN CLUSTERS Organism ID to follow CRITICAL RESULT CALLED TO, READ BACK BY AND VERIFIED WITH: T. GREEN, PHARM AT 0754 ON [id number] BY Rhea Bleacher    Culture   Final    TOO YOUNG TO READ Performed at [facility]    Report Status PENDING  Incomplete  Blood Culture ID Panel (Reflexed)     Status: Abnormal   Collection Time: 12/15/16  7:14 PM  Result Value Ref Range Status   Enterococcus species NOT DETECTED NOT DETECTED Final   Listeria monocytogenes NOT DETECTED NOT DETECTED Final   Staphylococcus species DETECTED (A) NOT DETECTED Final    Comment: CRITICAL RESULT CALLED TO, READ BACK BY AND VERIFIED WITH: T. GREEN, PHARM AT 0754 ON [id number] BY S. YARBROUGH    Staphylococcus aureus DETECTED (A)  NOT DETECTED Final    Comment: CRITICAL RESULT CALLED TO, READ BACK BY AND VERIFIED WITH: T. GREEN, PHARM AT 0754 ON [id number] BY S. YARBROUGH    Methicillin resistance DETECTED (A) NOT DETECTED Final    Comment: CRITICAL RESULT CALLED TO, READ BACK BY AND VERIFIED WITH: T. GREEN, PHARM AT 0754 ON [id number] BY S. YARBROUGH    Streptococcus species NOT DETECTED NOT DETECTED Final   Streptococcus agalactiae NOT DETECTED NOT DETECTED Final   Streptococcus pneumoniae NOT DETECTED NOT DETECTED Final   Streptococcus pyogenes NOT DETECTED NOT DETECTED Final   Acinetobacter  baumannii NOT DETECTED NOT DETECTED Final   Enterobacteriaceae species NOT DETECTED NOT DETECTED Final   Enterobacter cloacae complex NOT DETECTED NOT DETECTED Final   Escherichia coli NOT DETECTED NOT DETECTED Final   Klebsiella oxytoca NOT DETECTED NOT DETECTED Final   Klebsiella pneumoniae NOT DETECTED NOT DETECTED Final   Proteus species NOT DETECTED NOT DETECTED Final   Serratia marcescens NOT DETECTED NOT DETECTED Final   Haemophilus influenzae NOT DETECTED NOT DETECTED Final   Neisseria meningitidis NOT DETECTED NOT DETECTED Final   Pseudomonas aeruginosa NOT DETECTED NOT DETECTED Final   Candida albicans NOT DETECTED NOT DETECTED Final   Candida glabrata NOT DETECTED NOT DETECTED Final   Candida krusei NOT DETECTED NOT DETECTED Final   Candida parapsilosis NOT DETECTED NOT DETECTED Final   Candida tropicalis NOT DETECTED NOT DETECTED Final    Comment: Performed at [facility]      12/17/2016, 3:25 PM     LOS: 4 days    Records and images were personally reviewed where available.

## 2016-12-17 NOTE — Progress Notes (Signed)
Paged Md. On call to make aware of critical K+ of 2.6

## 2016-12-18 ENCOUNTER — Inpatient Hospital Stay (HOSPITAL_COMMUNITY): Payer: Medicare Other

## 2016-12-18 DIAGNOSIS — J1100 Influenza due to unidentified influenza virus with unspecified type of pneumonia: Secondary | ICD-10-CM

## 2016-12-18 LAB — BASIC METABOLIC PANEL
ANION GAP: 9 (ref 5–15)
BUN: 12 mg/dL (ref 6–20)
CO2: 24 mmol/L (ref 22–32)
Calcium: 7.9 mg/dL — ABNORMAL LOW (ref 8.9–10.3)
Chloride: 99 mmol/L — ABNORMAL LOW (ref 101–111)
Creatinine, Ser: 1.17 mg/dL (ref 0.61–1.24)
GFR, EST NON AFRICAN AMERICAN: 58 mL/min — AB (ref >60)
Glucose, Bld: 207 mg/dL — ABNORMAL HIGH (ref 65–99)
POTASSIUM: 3.9 mmol/L (ref 3.5–5.1)
SODIUM: 132 mmol/L — AB (ref 135–145)

## 2016-12-18 LAB — GLUCOSE, CAPILLARY
GLUCOSE-CAPILLARY: 97 mg/dL (ref 65–99)
GLUCOSE-CAPILLARY: 275 mg/dL — AB (ref 65–99)
GLUCOSE-CAPILLARY: 203 mg/dL — AB (ref 65–99)
GLUCOSE-CAPILLARY: 155 mg/dL — AB (ref 65–99)

## 2016-12-18 LAB — HEPATIC FUNCTION PANEL
ALT: 10 U/L — ABNORMAL LOW (ref 17–63)
AST: 19 U/L (ref 15–41)
Albumin: 2.2 g/dL — ABNORMAL LOW (ref 3.5–5.0)
Alkaline Phosphatase: 75 U/L (ref 38–126)
BILIRUBIN DIRECT: 0.2 mg/dL (ref 0.1–0.5)
BILIRUBIN INDIRECT: 0.7 mg/dL (ref 0.3–0.9)
Total Bilirubin: 0.9 mg/dL (ref 0.3–1.2)
Total Protein: 5.6 g/dL — ABNORMAL LOW (ref 6.5–8.1)

## 2016-12-18 LAB — VANCOMYCIN, TROUGH: VANCOMYCIN TR: 20 ug/mL (ref 15–20)

## 2016-12-18 LAB — MAGNESIUM: MAGNESIUM: 1.8 mg/dL (ref 1.7–2.4)

## 2016-12-18 LAB — LIPASE, BLOOD: LIPASE: 10 U/L — AB (ref 11–51)

## 2016-12-18 MED ORDER — POTASSIUM CHLORIDE CRYS ER 20 MEQ PO TBCR
40.0000 meq | EXTENDED_RELEASE_TABLET | Freq: Two times a day (BID) | ORAL | Status: DC
  Administered 2016-12-18 – 2016-12-23 (×9): 40 meq via ORAL
  Filled 2016-12-18 (×9): qty 2

## 2016-12-18 MED ORDER — VANCOMYCIN HCL IN DEXTROSE 1-5 GM/200ML-% IV SOLN
1000.0000 mg | Freq: Two times a day (BID) | INTRAVENOUS | Status: DC
  Administered 2016-12-18 – 2016-12-21 (×5): 1000 mg via INTRAVENOUS
  Filled 2016-12-18 (×3): qty 200

## 2016-12-18 NOTE — Progress Notes (Signed)
PT Cancellation Note  Patient Details Name: Dylan Quinn MRN: [id number] DOB: 1936/12/24   Cancelled Treatment:     Attempted to work with patient this morning. Pt ws up in recliner and stated he had gotten OOB yesterday and had been up in the room, and stated he had been to [facility] this morning as well and was tired at the moment. He wanted [place] to return tomorrow, however did agree we could check back with him this afternoon if time allows.    [name] 12/18/2016, 11:17 AM  [name], PT Pager: [phone] 12/18/2016

## 2016-12-18 NOTE — Progress Notes (Signed)
Pharmacy Antibiotic Note  Dylan Quinn is a 80 y.o. male admitted on 12/11/2016 influenza pneumonia, subsequently found to have MRSA bacteremia.  Patient has finished a 5-day course of Tamiflu.  Empiric antibiotic regimen was changed to vancomycin monotherapy.  Patient is being followed by the Infectious Diseases service.  Pharmacy dosing assistance was requested for vancomycin.  Vancomycin trough today at upper end of therapeutic range Serum creatinine WNL; up slightly since admission but stable over the past 48 hours Given anticipated need for a long course of vancomycin and potential for further accumulation of drug, will reduce dosage slightly.  Plan: Reduce vancomycin to 1000 mg IV q12h Follow renal function, vancomycin trough levels, clinical course.  Height: 6' (182.9 cm) Weight: 215 lb (97.5 kg) IBW/kg (Calculated) : 77.6  Temp (24hrs), Avg:98.9 F (37.2 C), Min:98 F (36.7 C), Max:100.6 F (38.1 C)   Recent Labs Lab 12/15/16 1857  12/16/16 1654 12/17/16 0512 12/17/16 1211 12/17/16 1737 12/18/16 0912  WBC 12.7*  --   --  13.6*  --   --   --   CREATININE 0.76  < > 1.20 1.21 1.14 1.13 1.17  VANCOTROUGH  --   --   --   --   --   --  20  < > = values in this interval not displayed.  Estimated Creatinine Clearance: 63 mL/min (by C-G formula based on SCr of 1.17 mg/dL).    Allergies  Allergen Reactions  . Lisinopril Hives    hives    Antimicrobials this admission: 12/23 vanc >> 12/23 cefepime >>12/25   Microbiology results: 12/19 MRSA PCR screen: negative 12/23 BCx:   MRSA (by BCID) in 1 of 2 sets 12/23 Sputum: collected 12/26 BCx: collected  Thank you for allowing Pharmacy to participate in this patient's care.  Clayburn Pert, PharmD, BCPS Pager: [phone] 12/18/2016  10:45 AM

## 2016-12-18 NOTE — Progress Notes (Signed)
OT Cancellation Note  Patient Details Name: Dylan Quinn MRN: [id number] DOB: [birth year]   Cancelled Treatment:    Reason Eval/Treat Not Completed: Patient at procedure or test/ unavailable  Pt having chest xray- will check on pt later in day or next day  Kari Baars, [place]  Payton Mccallum D 12/18/2016, 1:31 PM

## 2016-12-18 NOTE — Care Management Important Message (Signed)
Important Message  Patient Details  Name: Dylan Quinn MRN: [id number] Date of Birth: [birth year]   Medicare Important Message Given:  Yes    Kerin Salen 12/18/2016, 1:46 PMImportant Message  Patient Details  Name: Dylan Quinn MRN: [id number] Date of Birth: 01-25-1936   Medicare Important Message Given:  Yes    Kerin Salen 12/18/2016, 1:45 PM

## 2016-12-18 NOTE — Progress Notes (Signed)
INFECTIOUS DISEASE PROGRESS NOTE  ID: Dylan Quinn is a 80 y.o. male with  Principal Problem:   Diabetic ketoacidosis without coma associated with type 1 diabetes mellitus ([place]) Active Problems:   Essential hypertension   URI (upper respiratory infection)   Nausea & vomiting   DKA, type 1 (HCC)  Subjective: Temp this AM Resting quietly  Abtx:  Anti-infectives    Start     Dose/Rate Route Frequency Ordered Stop   12/18/16 1200  vancomycin (VANCOCIN) IVPB 1000 mg/200 mL premix     1,000 mg 200 mL/hr over 60 Minutes Intravenous Every 12 hours 12/18/16 1007     12/16/16 0800  vancomycin (VANCOCIN) 1,250 mg in sodium chloride 0.9 % 250 mL IVPB  Status:  Discontinued     1,250 mg 166.7 mL/hr over 90 Minutes Intravenous Every 12 hours 12/15/16 1846 12/18/16 1007   12/15/16 2000  ceFEPIme (MAXIPIME) 1 g in dextrose 5 % 50 mL IVPB  Status:  Discontinued     1 g 100 mL/hr over 30 Minutes Intravenous Every 8 hours 12/15/16 1836 12/17/16 1549   12/15/16 1900  vancomycin (VANCOCIN) 2,000 mg in sodium chloride 0.9 % 500 mL IVPB     2,000 mg 250 mL/hr over 120 Minutes Intravenous  Once 12/15/16 1846 12/15/16 2204   12/11/16 1000  oseltamivir (TAMIFLU) capsule 75 mg     75 mg Oral 2 times daily 12/11/16 0737 12/16/16 0959      Medications:  Scheduled: . brimonidine  1 drop Both Eyes BID   And  . timolol  1 drop Both Eyes BID  . calcium carbonate  400 mg of elemental calcium Oral Daily  . dorzolamide  1 drop Both Eyes BID  . enoxaparin (LOVENOX) injection  40 mg Subcutaneous Daily  . feeding supplement (GLUCERNA SHAKE)  237 mL Oral TID BM  . guaiFENesin  600 mg Oral BID  . insulin aspart  0-15 Units Subcutaneous TID WC  . insulin aspart  0-5 Units Subcutaneous QHS  . insulin glargine  30 Units Subcutaneous Daily  . irbesartan  300 mg Oral Daily  . latanoprost  1 drop Both Eyes QHS  . potassium chloride  40 mEq Oral BID  . pregabalin  100 mg Oral BID  . vancomycin  1,000 mg  Intravenous Q12H    Objective: Vital signs in last 24 hours: Temp:  [98.2 F (36.8 C)-100.6 F (38.1 C)] 99.5 F (37.5 C) (12/26 1415) Pulse Rate:  [81-89] 89 (12/26 1415) Resp:  [18-20] 20 (12/26 1415) BP: (126-164)/(54-78) 152/78 (12/26 1415) SpO2:  [94 %-96 %] 96 % (12/26 1415)   General appearance: fatigued and no distress Resp: rhonchi anterior - bilateral Cardio: regular rate and rhythm GI: normal findings: bowel sounds normal and soft, non-tender  Lab Results  Recent Labs  12/15/16 1857  12/17/16 0512  12/17/16 1737 12/18/16 0912  WBC 12.7*  --  13.6*  --   --   --   HGB 12.2*  --  11.3*  --   --   --   HCT 34.9*  --  31.6*  --   --   --   NA 132*  < > 132*  < > 133* 132*  K 2.5*  < > 2.6*  < > 3.2* 3.9  CL 94*  < > 98*  < > 100* 99*  CO2 26  < > 27  < > 26 24  BUN 11  < > 13  < >  14 12  CREATININE 0.76  < > 1.21  < > 1.13 1.17  < > = values in this interval not displayed. Liver Panel  Recent Labs  12/18/16 0917  PROT 5.6*  ALBUMIN 2.2*  AST 19  ALT 10*  ALKPHOS 75  BILITOT 0.9  BILIDIR 0.2  IBILI 0.7   Sedimentation Rate  Recent Labs  12/15/16 1857  ESRSEDRATE 77*   C-Reactive Protein No results for input(s): CRP in the last 72 hours.  Microbiology: Recent Results (from the past 240 hour(s))  MRSA PCR Screening     Status: None   Collection Time: 12/11/16  5:29 AM  Result Value Ref Range Status   MRSA by PCR NEGATIVE NEGATIVE Final    Comment:        The GeneXpert MRSA Assay (FDA approved for NASAL specimens only), is one component of a comprehensive MRSA colonization surveillance program. It is not intended to diagnose MRSA infection nor to guide or monitor treatment for MRSA infections.   Culture, expectorated sputum-assessment     Status: None   Collection Time: 12/15/16  7:03 AM  Result Value Ref Range Status   Specimen Description SPUTUM  Final   Special Requests Normal  Final   Sputum evaluation THIS SPECIMEN IS  ACCEPTABLE FOR SPUTUM CULTURE  Final   Report Status 12/15/2016 FINAL  Final  Culture, blood (routine x 2) Call MD if unable to obtain prior to antibiotics being given     Status: None (Preliminary result)   Collection Time: 12/15/16  6:57 PM  Result Value Ref Range Status   Specimen Description BLOOD BLOOD RIGHT FOREARM  Final   Special Requests IN PEDIATRIC BOTTLE [place]  Final   Culture   Final    NO GROWTH 2 DAYS Performed at [facility]    Report Status PENDING  Incomplete  Culture, blood (routine x 2) Call MD if unable to obtain prior to antibiotics being given     Status: Abnormal (Preliminary result)   Collection Time: 12/15/16  7:14 PM  Result Value Ref Range Status   Specimen Description BLOOD RIGHT HAND  Final   Special Requests IN PEDIATRIC BOTTLE 1.5CC  Final   Culture  Setup Time   Final    PED GRAM POSITIVE COCCI IN CLUSTERS Organism ID to follow CRITICAL RESULT CALLED TO, READ BACK BY AND VERIFIED WITH: T. GREEN, PHARM AT 0754 ON [id number] BY Rhea Bleacher    Culture (A)  Final    STAPHYLOCOCCUS AUREUS SUSCEPTIBILITIES TO FOLLOW Performed at [facility]    Report Status PENDING  Incomplete  Blood Culture ID Panel (Reflexed)     Status: Abnormal   Collection Time: 12/15/16  7:14 PM  Result Value Ref Range Status   Enterococcus species NOT DETECTED NOT DETECTED Final   Listeria monocytogenes NOT DETECTED NOT DETECTED Final   Staphylococcus species DETECTED (A) NOT DETECTED Final    Comment: CRITICAL RESULT CALLED TO, READ BACK BY AND VERIFIED WITH: T. GREEN, PHARM AT 0754 ON [id number] BY S. YARBROUGH    Staphylococcus aureus DETECTED (A) NOT DETECTED Final    Comment: CRITICAL RESULT CALLED TO, READ BACK BY AND VERIFIED WITH: T. GREEN, PHARM AT 0754 ON [id number] BY S. YARBROUGH    Methicillin resistance DETECTED (A) NOT DETECTED Final    Comment: CRITICAL RESULT CALLED TO, READ BACK BY AND VERIFIED WITH: T. GREEN, PHARM AT 0754 ON [id number] BY S. YARBROUGH      Streptococcus species NOT DETECTED  NOT DETECTED Final   Streptococcus agalactiae NOT DETECTED NOT DETECTED Final   Streptococcus pneumoniae NOT DETECTED NOT DETECTED Final   Streptococcus pyogenes NOT DETECTED NOT DETECTED Final   Acinetobacter baumannii NOT DETECTED NOT DETECTED Final   Enterobacteriaceae species NOT DETECTED NOT DETECTED Final   Enterobacter cloacae complex NOT DETECTED NOT DETECTED Final   Escherichia coli NOT DETECTED NOT DETECTED Final   Klebsiella oxytoca NOT DETECTED NOT DETECTED Final   Klebsiella pneumoniae NOT DETECTED NOT DETECTED Final   Proteus species NOT DETECTED NOT DETECTED Final   Serratia marcescens NOT DETECTED NOT DETECTED Final   Haemophilus influenzae NOT DETECTED NOT DETECTED Final   Neisseria meningitidis NOT DETECTED NOT DETECTED Final   Pseudomonas aeruginosa NOT DETECTED NOT DETECTED Final   Candida albicans NOT DETECTED NOT DETECTED Final   Candida glabrata NOT DETECTED NOT DETECTED Final   Candida krusei NOT DETECTED NOT DETECTED Final   Candida parapsilosis NOT DETECTED NOT DETECTED Final   Candida tropicalis NOT DETECTED NOT DETECTED Final    Comment: Performed at [facility]    Studies/Results: Dg Chest Port 1 View  Result Date: 12/18/2016 CLINICAL DATA:  Shortness of Breath EXAM: PORTABLE CHEST 1 VIEW COMPARISON:  12/15/2016 FINDINGS: Cardiomediastinal silhouette is stable. Worsening reticular interstitial prominence bilaterally. There is persistent patchy airspace disease right lower lobe and left midlung. Worsening pneumonia or pulmonary edema cannot be excluded. IMPRESSION: Worsening reticular interstitial prominence bilaterally. There is persistent patchy airspace disease right lower lobe and left midlung. Worsening pneumonia or pulmonary edema cannot be excluded. Electronically Signed   By: Lahoma Crocker M.D.   On: 12/18/2016 13:37     Assessment/Plan: Influenza MRSA bacteremia  Continue vanco Watch temps Check  TEE Await Repeat BCx (sent 12-26) Check with infection control on when to stop influenza isolation  Total days of antibiotics: [place] [facility] (pager) [phone] [email or web address] 12/18/2016, 3:43 PM  LOS: 5 days

## 2016-12-18 NOTE — Progress Notes (Addendum)
PROGRESS NOTE    Dylan Quinn  [id number] DOB: Jul 11, 1936 DOA: 12/11/2016 PCP: Tracie Harrier, MD    Brief Narrative:  80 y/o with history of HTN and DM who presented to the hospital with + influenza A and DKA. Patient DKA resolved and influenza was treated with 5 days of tamiflu.  On 12/22 patient had 1 episode of possible hemoptysis and then another episode on 12/23.  Sputum culture was sent.  Patient underwent chest xray and showed possible pneumonia or hemorrhage.  Patient was then started on antibiotics for pneumonia. Now found to have MRSA bacteremia   Assessment & Plan:   Principal Problem:   Diabetic ketoacidosis without coma associated with type 1 diabetes mellitus ([place]) Active Problems:   Essential hypertension   URI (upper respiratory infection)   Nausea & vomiting   DKA, type 1 (HCC)  Bacteremia with MRSA,Influenza pneumonia - MRSA + - patient on Vancomycin for HCAP - infectious disease following  Stopped  Cefepime TEE requested 12/25 Await Repeat BCx (sent 12-26)  Diabetic ketoacidosis without coma associated with type 1 diabetes mellitus ([place]) - successfully transitioned to subcutaneous insulin, Will increase Lantus dose given elevated blood sugars - lantus to 32 units qAM - Diabetic diet - Antiemetic when necessary for nausea - fasting glucose of 207 Hemoglobin A1c 10.4  HCAP not on oxygen - treating with Vancomycin  - respiratory status is stable- not requiring oxygen, not tachypneic - continue current treatment  Hemoptysis, resolved - sputum culture pending - CXR showing possible pneumonia vs hemorrhage - pneumonia order set utilized for HCAP as patient has been hospitalized for past 5 days  Hypokalemia , resolved - remains consistently low - discussed with on call nephrology who encourages increasing frequency of PO potassium supplementation - repeat BMP and Mg pending - magnesium low at 1.5- 2g IV ordered yesterday      Essential  hypertension - Continue ARB - will add PRN hydralazine   Influenza A - completed course of tamiflu    Nausea & vomiting, resolved , likely secondary to DKA - nausea still affecting patient - Zofran PRN Check liver function , lipase    DVT prophylaxis: restarting lovenox as H/H stable Code Status: Full Family Communication:  discussed with daughter on the phone Disposition Plan: pending patient improvement- may need SNF for short term rehab    Consultants:   PT  Diabetes Coordinator  Infectious Disease  Procedures:   None  Antimicrobials:   Tamiflu   Cefepime 12/23>  Vancomycin 12/23>    Subjective:  Low-grade fever, denies any nausea vomiting abdominal pain   Objective: Vitals:   12/17/16 0600 12/17/16 1400 12/17/16 2105 12/18/16 0534  BP: (!) 160/82 (!) 161/82 (!) 126/54 (!) 164/68  Pulse: 84 95 81 89  Resp: 18 20 18 18   Temp: 99.1 F (37.3 C) 98 F (36.7 C) 98.2 F (36.8 C) (!) 100.6 F (38.1 C)  TempSrc: Oral Oral Oral Oral  SpO2: 92% 94% 94% 95%  Weight:      Height:        Intake/Output Summary (Last 24 hours) at 12/18/16 1309 Last data filed at 12/18/16 0900  Gross per 24 hour  Intake              520 ml  Output             1252 ml  Net             -732 ml   Filed Weights   12/11/16  0159  Weight: 97.5 kg (215 lb)    Examination:  General exam: Appears calm and comfortable  Respiratory system:  Respiratory effort normal. Few crackles scattered throughout lung fields. Cardiovascular system: S1 & S2 heard, RRR. No JVD, murmurs, rubs, gallops or clicks. No pedal edema. Gastrointestinal system: Abdomen is nondistended, soft and nontender. No organomegaly or masses felt. Normal bowel sounds heard. Central nervous system: Alert and oriented. No focal neurological deficits. Extremities: Symmetric 5 x 5 power. Skin: No rashes, lesions or ulcers Psychiatry: Judgement and insight appear normal. Mood & affect appropriate.     Data  Reviewed: I have personally reviewed following labs and imaging studies  CBC:  Recent Labs Lab 12/15/16 1857 12/17/16 0512  WBC 12.7* 13.6*  NEUTROABS  --  11.0*  HGB 12.2* 11.3*  HCT 34.9* 31.6*  MCV 86.4 86.8  PLT 195 [id number]   Basic Metabolic Panel:  Recent Labs Lab 12/16/16 1102 12/16/16 1654 12/17/16 0512 12/17/16 1211 12/17/16 1737 12/18/16 0525 12/18/16 0912  NA 132* 133* 132* 133* 133*  --  132*  K 3.0* 2.8* 2.6* 3.1* 3.2*  --  3.9  CL 95* 97* 98* 98* 100*  --  99*  CO2 27 27 27 26 26   --  24  GLUCOSE 216* 128* 162* 277* 173*  --  207*  BUN 15 15 13 15 14   --  12  CREATININE 1.14 1.20 1.21 1.14 1.13  --  1.17  CALCIUM 7.8* 8.0* 7.9* 7.7* 7.9*  --  7.9*  MG 1.5*  --   --  1.9  --  1.8  --    GFR: Estimated Creatinine Clearance: 63 mL/min (by C-G formula based on SCr of 1.17 mg/dL). Liver Function Tests: No results for input(s): AST, ALT, ALKPHOS, BILITOT, PROT, ALBUMIN in the last 168 hours. No results for input(s): LIPASE, AMYLASE in the last 168 hours. No results for input(s): AMMONIA in the last 168 hours. Coagulation Profile: No results for input(s): INR, PROTIME in the last 168 hours. Cardiac Enzymes: No results for input(s): CKTOTAL, CKMB, CKMBINDEX, TROPONINI in the last 168 hours. BNP (last 3 results) No results for input(s): PROBNP in the last 8760 hours. HbA1C: No results for input(s): HGBA1C in the last 72 hours. CBG:  Recent Labs Lab 12/17/16 1202 12/17/16 1703 12/17/16 2108 12/18/16 0731 12/18/16 1159  GLUCAP 269* 148* 113* 97 275*   Lipid Profile: No results for input(s): CHOL, HDL, LDLCALC, TRIG, CHOLHDL, LDLDIRECT in the last 72 hours. Thyroid Function Tests: No results for input(s): TSH, T4TOTAL, FREET4, T3FREE, THYROIDAB in the last 72 hours. Anemia Panel: No results for input(s): VITAMINB12, FOLATE, FERRITIN, TIBC, IRON, RETICCTPCT in the last 72 hours. Sepsis Labs: No results for input(s): PROCALCITON, LATICACIDVEN in the last  168 hours.  Recent Results (from the past 240 hour(s))  MRSA PCR Screening     Status: None   Collection Time: 12/11/16  5:29 AM  Result Value Ref Range Status   MRSA by PCR NEGATIVE NEGATIVE Final    Comment:        The GeneXpert MRSA Assay (FDA approved for NASAL specimens only), is one component of a comprehensive MRSA colonization surveillance program. It is not intended to diagnose MRSA infection nor to guide or monitor treatment for MRSA infections.   Culture, expectorated sputum-assessment     Status: None   Collection Time: 12/15/16  7:03 AM  Result Value Ref Range Status   Specimen Description SPUTUM  Final   Special Requests Normal  Final   Sputum evaluation THIS SPECIMEN IS ACCEPTABLE FOR SPUTUM CULTURE  Final   Report Status 12/15/2016 FINAL  Final  Culture, blood (routine x 2) Call MD if unable to obtain prior to antibiotics being given     Status: None (Preliminary result)   Collection Time: 12/15/16  6:57 PM  Result Value Ref Range Status   Specimen Description BLOOD BLOOD RIGHT FOREARM  Final   Special Requests IN PEDIATRIC BOTTLE [place]  Final   Culture   Final    NO GROWTH 2 DAYS Performed at [facility]    Report Status PENDING  Incomplete  Culture, blood (routine x 2) Call MD if unable to obtain prior to antibiotics being given     Status: Abnormal (Preliminary result)   Collection Time: 12/15/16  7:14 PM  Result Value Ref Range Status   Specimen Description BLOOD RIGHT HAND  Final   Special Requests IN PEDIATRIC BOTTLE 1.5CC  Final   Culture  Setup Time   Final    PED GRAM POSITIVE COCCI IN CLUSTERS Organism ID to follow CRITICAL RESULT CALLED TO, READ BACK BY AND VERIFIED WITH: T. GREEN, PHARM AT 0754 ON [id number] BY Rhea Bleacher    Culture (A)  Final    STAPHYLOCOCCUS AUREUS SUSCEPTIBILITIES TO FOLLOW Performed at [facility]    Report Status PENDING  Incomplete  Blood Culture ID Panel (Reflexed)     Status: Abnormal   Collection  Time: 12/15/16  7:14 PM  Result Value Ref Range Status   Enterococcus species NOT DETECTED NOT DETECTED Final   Listeria monocytogenes NOT DETECTED NOT DETECTED Final   Staphylococcus species DETECTED (A) NOT DETECTED Final    Comment: CRITICAL RESULT CALLED TO, READ BACK BY AND VERIFIED WITH: T. GREEN, PHARM AT 0754 ON [id number] BY S. YARBROUGH    Staphylococcus aureus DETECTED (A) NOT DETECTED Final    Comment: CRITICAL RESULT CALLED TO, READ BACK BY AND VERIFIED WITH: T. GREEN, PHARM AT 0754 ON [id number] BY S. YARBROUGH    Methicillin resistance DETECTED (A) NOT DETECTED Final    Comment: CRITICAL RESULT CALLED TO, READ BACK BY AND VERIFIED WITH: T. GREEN, PHARM AT 0754 ON [id number] BY S. YARBROUGH    Streptococcus species NOT DETECTED NOT DETECTED Final   Streptococcus agalactiae NOT DETECTED NOT DETECTED Final   Streptococcus pneumoniae NOT DETECTED NOT DETECTED Final   Streptococcus pyogenes NOT DETECTED NOT DETECTED Final   Acinetobacter baumannii NOT DETECTED NOT DETECTED Final   Enterobacteriaceae species NOT DETECTED NOT DETECTED Final   Enterobacter cloacae complex NOT DETECTED NOT DETECTED Final   Escherichia coli NOT DETECTED NOT DETECTED Final   Klebsiella oxytoca NOT DETECTED NOT DETECTED Final   Klebsiella pneumoniae NOT DETECTED NOT DETECTED Final   Proteus species NOT DETECTED NOT DETECTED Final   Serratia marcescens NOT DETECTED NOT DETECTED Final   Haemophilus influenzae NOT DETECTED NOT DETECTED Final   Neisseria meningitidis NOT DETECTED NOT DETECTED Final   Pseudomonas aeruginosa NOT DETECTED NOT DETECTED Final   Candida albicans NOT DETECTED NOT DETECTED Final   Candida glabrata NOT DETECTED NOT DETECTED Final   Candida krusei NOT DETECTED NOT DETECTED Final   Candida parapsilosis NOT DETECTED NOT DETECTED Final   Candida tropicalis NOT DETECTED NOT DETECTED Final    Comment: Performed at [facility]         Radiology Studies: No results  found.      Scheduled Meds: . brimonidine  1 drop Both Eyes  BID   And  . timolol  1 drop Both Eyes BID  . calcium carbonate  400 mg of elemental calcium Oral Daily  . dorzolamide  1 drop Both Eyes BID  . enoxaparin (LOVENOX) injection  40 mg Subcutaneous Daily  . feeding supplement (GLUCERNA SHAKE)  237 mL Oral TID BM  . guaiFENesin  600 mg Oral BID  . insulin aspart  0-15 Units Subcutaneous TID WC  . insulin aspart  0-5 Units Subcutaneous QHS  . insulin glargine  30 Units Subcutaneous Daily  . irbesartan  300 mg Oral Daily  . latanoprost  1 drop Both Eyes QHS  . potassium chloride  40 mEq Oral Q6H  . pregabalin  100 mg Oral BID  . vancomycin  1,000 mg Intravenous Q12H   Continuous Infusions: . sodium chloride 50 mL/hr at 12/17/16 2005     LOS: 5 days    Time spent: 22 minutes    Reyne Dumas, MD [facility]    If 7PM-7AM, please contact night-coverage [email or web address] Password [email or web address] 12/18/2016, 1:09 PM

## 2016-12-19 ENCOUNTER — Encounter (HOSPITAL_COMMUNITY): Payer: Self-pay | Admitting: *Deleted

## 2016-12-19 ENCOUNTER — Inpatient Hospital Stay (HOSPITAL_COMMUNITY): Payer: Medicare Other

## 2016-12-19 ENCOUNTER — Encounter (HOSPITAL_COMMUNITY)
Admission: EM | Disposition: A | Discharge status: Skilled Nursing Facility | Payer: Self-pay | Source: Home / Self Care | Attending: Family Medicine

## 2016-12-19 DIAGNOSIS — Q211 Atrial septal defect: Secondary | ICD-10-CM

## 2016-12-19 DIAGNOSIS — R042 Hemoptysis: Secondary | ICD-10-CM

## 2016-12-19 DIAGNOSIS — I10 Essential (primary) hypertension: Secondary | ICD-10-CM

## 2016-12-19 DIAGNOSIS — J111 Influenza due to unidentified influenza virus with other respiratory manifestations: Secondary | ICD-10-CM

## 2016-12-19 DIAGNOSIS — E1010 Type 1 diabetes mellitus with ketoacidosis without coma: Principal | ICD-10-CM

## 2016-12-19 DIAGNOSIS — R079 Chest pain, unspecified: Secondary | ICD-10-CM

## 2016-12-19 HISTORY — PX: TEE WITHOUT CARDIOVERSION: SHX5443

## 2016-12-19 LAB — CULTURE, BLOOD (ROUTINE X 2)

## 2016-12-19 LAB — COMPREHENSIVE METABOLIC PANEL
ALK PHOS: 72 U/L (ref 38–126)
ALT: 10 U/L — AB (ref 17–63)
AST: 15 U/L (ref 15–41)
Albumin: 2.4 g/dL — ABNORMAL LOW (ref 3.5–5.0)
Anion gap: 8 (ref 5–15)
BUN: 9 mg/dL (ref 6–20)
CALCIUM: 8.3 mg/dL — AB (ref 8.9–10.3)
CO2: 25 mmol/L (ref 22–32)
CREATININE: 0.95 mg/dL (ref 0.61–1.24)
Chloride: 104 mmol/L (ref 101–111)
Glucose, Bld: 160 mg/dL — ABNORMAL HIGH (ref 65–99)
Potassium: 3.9 mmol/L (ref 3.5–5.1)
Sodium: 137 mmol/L (ref 135–145)
Total Bilirubin: 0.9 mg/dL (ref 0.3–1.2)
Total Protein: 5.6 g/dL — ABNORMAL LOW (ref 6.5–8.1)

## 2016-12-19 LAB — CBC
HEMATOCRIT: 31.3 % — AB (ref 39.0–52.0)
HEMOGLOBIN: 10.9 g/dL — AB (ref 13.0–17.0)
MCH: 30.6 pg (ref 26.0–34.0)
MCHC: 34.8 g/dL (ref 30.0–36.0)
MCV: 87.9 fL (ref 78.0–100.0)
Platelets: 329 10*3/uL (ref 150–400)
RBC: 3.56 MIL/uL — AB (ref 4.22–5.81)
RDW: 14.4 % (ref 11.5–15.5)
WBC: 15 10*3/uL — AB (ref 4.0–10.5)

## 2016-12-19 LAB — GLUCOSE, CAPILLARY
GLUCOSE-CAPILLARY: 149 mg/dL — AB (ref 65–99)
GLUCOSE-CAPILLARY: 151 mg/dL — AB (ref 65–99)
Glucose-Capillary: 147 mg/dL — ABNORMAL HIGH (ref 65–99)
Glucose-Capillary: 256 mg/dL — ABNORMAL HIGH (ref 65–99)
Glucose-Capillary: 273 mg/dL — ABNORMAL HIGH (ref 65–99)

## 2016-12-19 LAB — ECHOCARDIOGRAM COMPLETE
Height: 72 in
Weight: 3440 oz

## 2016-12-19 SURGERY — ECHOCARDIOGRAM, TRANSESOPHAGEAL
Anesthesia: Moderate Sedation

## 2016-12-19 MED ORDER — BUTAMBEN-TETRACAINE-BENZOCAINE 2-2-14 % EX AERO
INHALATION_SPRAY | CUTANEOUS | Status: DC | PRN
  Administered 2016-12-19: 2 via TOPICAL

## 2016-12-19 MED ORDER — DIPHENHYDRAMINE HCL 25 MG PO CAPS
25.0000 mg | ORAL_CAPSULE | Freq: Every evening | ORAL | Status: DC | PRN
  Administered 2016-12-19: 25 mg via ORAL
  Filled 2016-12-19: qty 1

## 2016-12-19 MED ORDER — MIDAZOLAM HCL 10 MG/2ML IJ SOLN
INTRAMUSCULAR | Status: DC | PRN
Start: 2016-12-19 — End: 2016-12-19
  Administered 2016-12-19 (×5): 1 mg via INTRAVENOUS

## 2016-12-19 MED ORDER — ZOLPIDEM TARTRATE 5 MG PO TABS
5.0000 mg | ORAL_TABLET | Freq: Every evening | ORAL | Status: DC | PRN
  Administered 2016-12-19 – 2016-12-20 (×2): 5 mg via ORAL
  Filled 2016-12-19 (×2): qty 1

## 2016-12-19 MED ORDER — FENTANYL CITRATE (PF) 100 MCG/2ML IJ SOLN
INTRAMUSCULAR | Status: DC | PRN
  Administered 2016-12-19: 25 ug via INTRAVENOUS
  Administered 2016-12-19: 12.5 ug via INTRAVENOUS
  Administered 2016-12-19: 25 ug via INTRAVENOUS

## 2016-12-19 MED ORDER — LIDOCAINE VISCOUS 2 % MT SOLN
OROMUCOSAL | Status: DC | PRN
Start: 2016-12-19 — End: 2016-12-19
  Administered 2016-12-19: 1 via OROMUCOSAL

## 2016-12-19 NOTE — Progress Notes (Signed)
[facility]  PROGRESS NOTE  Dylan Quinn [id number] DOB: 1936-11-20 DOA: 12/11/2016 PCP: Tracie Harrier, MD   Brief HPI:   80 y/o with history of HTN and DM who presented to the hospital with + influenza A and DKA. Patient DKA resolved and influenza was treated with 5 days of tamiflu.  On 12/22 patient had 1 episode of possible hemoptysis and then another episode on 12/23.  Sputum culture was sent.  Patient underwent chest xray and showed possible pneumonia or hemorrhage.  Patient was then started on antibiotics for pneumonia. Now found to have MRSA bacteremia.    Subjective   Patient went to [facility] for TEE, which was negative for mural thrombus or valvular vegetation.   Assessment/Plan:     1. Bacteremia with MRSA- patient underwent TEE today which was negative for valvular vegetation. Patient currently on vancomycin, infectious disease following. Awaiting repeat blood cultures sent on 12/18/2016. 2. Diabetic ketoacidosis without coma- patient has type 1 diabetes mellitus, currently on Lantus 32 units every morning, hemoglobin A1c 10.4. Continue sliding scale insulin with NovoLog. 3. Healthcare associated pneumonia, influenza pneumonia- continue vancomycin, respiratory status is stable. Patient not requiring oxygen. 4. Hemoptysis- resolved, likely from above. 5. Hypertension-blood pressure stable, continue ARB's, when necessary hydralazine. 6. Influenza-completed course of Tamiflu 7.     DVT prophylaxis: Lovenox  Code Status: Full code  Family Communication: Discussed with daughter and wife at bedside  Disposition Plan: Likely home in next 1-2 days   Consultants:  Infectious disease   Procedures:  None   Continuous infusions . sodium chloride 50 mL/hr at 12/18/16 1552      Antibiotics:   Anti-infectives    Start     Dose/Rate Route Frequency Ordered Stop   12/18/16 1200  vancomycin (VANCOCIN) IVPB 1000 mg/200 mL premix     1,000 mg 200 mL/hr  over 60 Minutes Intravenous Every 12 hours 12/18/16 1007     12/16/16 0800  vancomycin (VANCOCIN) 1,250 mg in sodium chloride 0.9 % 250 mL IVPB  Status:  Discontinued     1,250 mg 166.7 mL/hr over 90 Minutes Intravenous Every 12 hours 12/15/16 1846 12/18/16 1007   12/15/16 2000  ceFEPIme (MAXIPIME) 1 g in dextrose 5 % 50 mL IVPB  Status:  Discontinued     1 g 100 mL/hr over 30 Minutes Intravenous Every 8 hours 12/15/16 1836 12/17/16 1549   12/15/16 1900  vancomycin (VANCOCIN) 2,000 mg in sodium chloride 0.9 % 500 mL IVPB     2,000 mg 250 mL/hr over 120 Minutes Intravenous  Once 12/15/16 1846 12/15/16 2204   12/11/16 1000  oseltamivir (TAMIFLU) capsule 75 mg     75 mg Oral 2 times daily 12/11/16 0737 12/16/16 0959       Objective   Vitals:   12/19/16 1115 12/19/16 1125 12/19/16 1135 12/19/16 1409  BP: (!) 171/78 (!) 168/70 (!) 145/73 126/67  Pulse: 88 87 90 (!) 109  Resp: (!) 25 (!) 25 (!) 23 20  Temp:    98.3 F (36.8 C)  TempSrc:    Oral  SpO2: 93% 94% 93% 95%  Weight:      Height:        Intake/Output Summary (Last 24 hours) at 12/19/16 1636 Last data filed at 12/19/16 1300  Gross per 24 hour  Intake              240 ml  Output              350  ml  Net             -110 ml   Filed Weights   12/11/16 0159  Weight: 97.5 kg (215 lb)     Physical Examination:  General exam: Appears calm and comfortable. Respiratory system: Clear to auscultation. Respiratory effort normal. Cardiovascular system:  RRR. No  murmurs, rubs, gallops. No pedal edema. GI system: Abdomen is nondistended, soft and nontender. No organomegaly.  Central nervous system. No focal neurological deficits. 5 x 5 power in all extremities. Skin: No rashes, lesions or ulcers. Psychiatry: Alert, oriented x 3.Judgement and insight appear normal. Affect normal.    Data Reviewed: I have personally reviewed following labs and imaging studies  CBG:  Recent Labs Lab 12/18/16 1631 12/18/16 2053  12/19/16 0354 12/19/16 0726 12/19/16 1328  GLUCAP 203* 155* 149* 147* 151*    CBC:  Recent Labs Lab 12/15/16 1857 12/17/16 0512 12/19/16 0558  WBC 12.7* 13.6* 15.0*  NEUTROABS  --  11.0*  --   HGB 12.2* 11.3* 10.9*  HCT 34.9* 31.6* 31.3*  MCV 86.4 86.8 87.9  PLT 195 241 [id number]    Basic Metabolic Panel:  Recent Labs Lab 12/16/16 1102  12/17/16 0512 12/17/16 1211 12/17/16 1737 12/18/16 0525 12/18/16 0912 12/19/16 0558  NA 132*  < > 132* 133* 133*  --  132* 137  K 3.0*  < > 2.6* 3.1* 3.2*  --  3.9 3.9  CL 95*  < > 98* 98* 100*  --  99* 104  CO2 27  < > 27 26 26   --  24 25  GLUCOSE 216*  < > 162* 277* 173*  --  207* 160*  BUN 15  < > 13 15 14   --  12 9  CREATININE 1.14  < > 1.21 1.14 1.13  --  1.17 0.95  CALCIUM 7.8*  < > 7.9* 7.7* 7.9*  --  7.9* 8.3*  MG 1.5*  --   --  1.9  --  1.8  --   --   < > = values in this interval not displayed.  Recent Results (from the past 240 hour(s))  MRSA PCR Screening     Status: None   Collection Time: 12/11/16  5:29 AM  Result Value Ref Range Status   MRSA by PCR NEGATIVE NEGATIVE Final    Comment:        The GeneXpert MRSA Assay (FDA approved for NASAL specimens only), is one component of a comprehensive MRSA colonization surveillance program. It is not intended to diagnose MRSA infection nor to guide or monitor treatment for MRSA infections.   Culture, expectorated sputum-assessment     Status: None   Collection Time: 12/15/16  7:03 AM  Result Value Ref Range Status   Specimen Description SPUTUM  Final   Special Requests Normal  Final   Sputum evaluation THIS SPECIMEN IS ACCEPTABLE FOR SPUTUM CULTURE  Final   Report Status 12/15/2016 FINAL  Final  Culture, blood (routine x 2) Call MD if unable to obtain prior to antibiotics being given     Status: None (Preliminary result)   Collection Time: 12/15/16  6:57 PM  Result Value Ref Range Status   Specimen Description BLOOD BLOOD RIGHT FOREARM  Final   Special Requests IN  PEDIATRIC BOTTLE [place]  Final   Culture   Final    NO GROWTH 3 DAYS Performed at [facility]    Report Status PENDING  Incomplete  Culture, blood (routine x 2) Call  MD if unable to obtain prior to antibiotics being given     Status: Abnormal   Collection Time: 12/15/16  7:14 PM  Result Value Ref Range Status   Specimen Description BLOOD RIGHT HAND  Final   Special Requests IN PEDIATRIC BOTTLE 1.5CC  Final   Culture  Setup Time   Final    PED GRAM POSITIVE COCCI IN CLUSTERS Organism ID to follow CRITICAL RESULT CALLED TO, READ BACK BY AND VERIFIED WITH: T. GREEN, PHARM AT 0754 ON [id number] BY Rhea Bleacher Performed at [facility] (A)  Final   Report Status 12/19/2016 FINAL  Final   Organism ID, Bacteria METHICILLIN RESISTANT STAPHYLOCOCCUS AUREUS  Final      Susceptibility   Methicillin resistant staphylococcus aureus - MIC*    CIPROFLOXACIN >=8 RESISTANT Resistant     ERYTHROMYCIN >=8 RESISTANT Resistant     GENTAMICIN <=0.5 SENSITIVE Sensitive     OXACILLIN >=4 RESISTANT Resistant     TETRACYCLINE <=1 SENSITIVE Sensitive     VANCOMYCIN <=0.5 SENSITIVE Sensitive     TRIMETH/SULFA <=10 SENSITIVE Sensitive     CLINDAMYCIN <=0.25 SENSITIVE Sensitive     RIFAMPIN <=0.5 SENSITIVE Sensitive     Inducible Clindamycin NEGATIVE Sensitive     * METHICILLIN RESISTANT STAPHYLOCOCCUS AUREUS  Blood Culture ID Panel (Reflexed)     Status: Abnormal   Collection Time: 12/15/16  7:14 PM  Result Value Ref Range Status   Enterococcus species NOT DETECTED NOT DETECTED Final   Listeria monocytogenes NOT DETECTED NOT DETECTED Final   Staphylococcus species DETECTED (A) NOT DETECTED Final    Comment: CRITICAL RESULT CALLED TO, READ BACK BY AND VERIFIED WITH: T. GREEN, PHARM AT 0754 ON [id number] BY S. YARBROUGH    Staphylococcus aureus DETECTED (A) NOT DETECTED Final    Comment: CRITICAL RESULT CALLED TO, READ BACK BY AND VERIFIED  WITH: T. GREEN, PHARM AT 0754 ON [id number] BY S. YARBROUGH    Methicillin resistance DETECTED (A) NOT DETECTED Final    Comment: CRITICAL RESULT CALLED TO, READ BACK BY AND VERIFIED WITH: T. GREEN, PHARM AT 0754 ON [id number] BY S. YARBROUGH    Streptococcus species NOT DETECTED NOT DETECTED Final   Streptococcus agalactiae NOT DETECTED NOT DETECTED Final   Streptococcus pneumoniae NOT DETECTED NOT DETECTED Final   Streptococcus pyogenes NOT DETECTED NOT DETECTED Final   Acinetobacter baumannii NOT DETECTED NOT DETECTED Final   Enterobacteriaceae species NOT DETECTED NOT DETECTED Final   Enterobacter cloacae complex NOT DETECTED NOT DETECTED Final   Escherichia coli NOT DETECTED NOT DETECTED Final   Klebsiella oxytoca NOT DETECTED NOT DETECTED Final   Klebsiella pneumoniae NOT DETECTED NOT DETECTED Final   Proteus species NOT DETECTED NOT DETECTED Final   Serratia marcescens NOT DETECTED NOT DETECTED Final   Haemophilus influenzae NOT DETECTED NOT DETECTED Final   Neisseria meningitidis NOT DETECTED NOT DETECTED Final   Pseudomonas aeruginosa NOT DETECTED NOT DETECTED Final   Candida albicans NOT DETECTED NOT DETECTED Final   Candida glabrata NOT DETECTED NOT DETECTED Final   Candida krusei NOT DETECTED NOT DETECTED Final   Candida parapsilosis NOT DETECTED NOT DETECTED Final   Candida tropicalis NOT DETECTED NOT DETECTED Final    Comment: Performed at [facility]  Culture, blood (Routine X 2) w Reflex to ID Panel     Status: None (Preliminary result)   Collection Time: 12/18/16  9:12 AM  Result Value Ref Range Status  Specimen Description BLOOD LEFT HAND  Final   Special Requests IN PEDIATRIC BOTTLE 3CC  Final   Culture   Final    NO GROWTH 1 DAY Performed at [facility]    Report Status PENDING  Incomplete     Liver Function Tests:  Recent Labs Lab 12/18/16 0917 12/19/16 0558  AST 19 15  ALT 10* 10*  ALKPHOS 75 72  BILITOT 0.9 0.9  PROT 5.6* 5.6*   ALBUMIN 2.2* 2.4*    Recent Labs Lab 12/18/16 0917  LIPASE 10*   No results for input(s): AMMONIA in the last 168 hours.  Cardiac Enzymes: No results for input(s): CKTOTAL, CKMB, CKMBINDEX, TROPONINI in the last 168 hours. BNP (last 3 results)  Recent Labs  12/15/16 1857  BNP 201.8*    ProBNP (last 3 results) No results for input(s): PROBNP in the last 8760 hours.    Studies: Dg Chest Port 1 View  Result Date: 12/18/2016 CLINICAL DATA:  Shortness of Breath EXAM: PORTABLE CHEST 1 VIEW COMPARISON:  12/15/2016 FINDINGS: Cardiomediastinal silhouette is stable. Worsening reticular interstitial prominence bilaterally. There is persistent patchy airspace disease right lower lobe and left midlung. Worsening pneumonia or pulmonary edema cannot be excluded. IMPRESSION: Worsening reticular interstitial prominence bilaterally. There is persistent patchy airspace disease right lower lobe and left midlung. Worsening pneumonia or pulmonary edema cannot be excluded. Electronically Signed   By: Lahoma Crocker M.D.   On: 12/18/2016 13:37    Scheduled Meds: . brimonidine  1 drop Both Eyes BID   And  . timolol  1 drop Both Eyes BID  . calcium carbonate  400 mg of elemental calcium Oral Daily  . dorzolamide  1 drop Both Eyes BID  . enoxaparin (LOVENOX) injection  40 mg Subcutaneous Daily  . feeding supplement (GLUCERNA SHAKE)  237 mL Oral TID BM  . guaiFENesin  600 mg Oral BID  . insulin aspart  0-15 Units Subcutaneous TID WC  . insulin aspart  0-5 Units Subcutaneous QHS  . insulin glargine  30 Units Subcutaneous Daily  . irbesartan  300 mg Oral Daily  . latanoprost  1 drop Both Eyes QHS  . potassium chloride  40 mEq Oral BID  . pregabalin  100 mg Oral BID  . vancomycin  1,000 mg Intravenous Q12H      Time spent: 25 min  [facility] Pager [phone]. If 7PM-7AM, please contact night-coverage at [email or web address], Office  [phone]  password [email or web address] 12/19/2016,  4:36 PM  LOS: 6 days

## 2016-12-19 NOTE — CV Procedure (Signed)
    TRANSESOPHAGEAL ECHOCARDIOGRAM (TEE) NOTE  INDICATIONS: infective endocarditis  PROCEDURE:   Informed consent was obtained prior to the procedure. The risks, benefits and alternatives for the procedure were discussed and the patient comprehended these risks.  Risks include, but are not limited to, cough, sore throat, vomiting, nausea, somnolence, esophageal and stomach trauma or perforation, bleeding, low blood pressure, aspiration, pneumonia, infection, trauma to the teeth and death.    After a procedural time-out, the patient was given 5 mg versed and 67.5 mcg fentanyl for moderate sedation.  The patient's heart rate, blood pressure, and oxygen saturation are monitored continuously during the procedure.The oropharynx was anesthetized 10 cc of topical 1% viscous lidocaine and 2 cetacaine sprays.  The transesophageal probe was inserted in the esophagus and stomach without difficulty and multiple views were obtained.  The patient was kept under observation until the patient left the procedure room.  The period of conscious sedation is 26 minutes, of which I was present face-to-face 100% of this time. The patient left the procedure room in stable condition.   Agitated microbubble saline contrast was administered.  COMPLICATIONS:    There were no immediate complications.  Findings:  1. LEFT VENTRICLE: The left ventricular wall thickness is mildly increased.  The left ventricular cavity is normal in size. Wall motion is normal.  LVEF is 60-65%.  2. RIGHT VENTRICLE:  The right ventricle is normal in structure and function without any thrombus or masses.    3. LEFT ATRIUM:  The left atrium is moderately dilated in size without any thrombus or masses.  There is not spontaneous echo contrast ("smoke") in the left atrium consistent with a low flow state.  4. LEFT ATRIAL APPENDAGE:  The left atrial appendage is free of any thrombus or masses. The appendage has single lobes. Pulse doppler  indicates high flow in the appendage.  5. ATRIAL SEPTUM:  The atrial septum is aneurysmal.  There is evidence for interatrial shunting by color doppler and saline microbubble consistent with small, intermittent PFO.  6. RIGHT ATRIUM:  The right atrium is mildly dilated. A prominent eustachian valve is noted.  7. MITRAL VALVE:  The mitral valve is normal in structure and function with trivial regurgitation.  There were no vegetations or stenosis.  8. AORTIC VALVE:  The aortic valve is trileaflet, normal in structure and function with no regurgitation.  There were no vegetations or stenosis  9. TRICUSPID VALVE:  The tricuspid valve is normal in structure and function with trivial regurgitation.  There were no vegetations or stenosis  10.  PULMONIC VALVE:  The pulmonic valve is normal in structure and function with no regurgitation.  There were no vegetations or stenosis.   11. AORTIC ARCH, ASCENDING AND DESCENDING AORTA:  There was grade 1 Ron Parker et. Al, 1992) atherosclerosis of the ascending aorta, aortic arch, or proximal descending aorta.  12. PULMONARY VEINS: Anomalous pulmonary venous return was not noted.  13. PERICARDIUM: The pericardium appeared normal and non-thickened.  There is no pericardial effusion.  IMPRESSION:   1. No evidence for endocarditis 2. Small PFO is present 3. No LAA thrombus 4. LVEF 60-65% with normal wall motion  RECOMMENDATIONS:    1.  Treatment of influenza pneumonia and MRSA bacteremia per ID recommendations.  Time Spent Directly with the Patient:  45 minutes   Pixie Casino, MD, [facility] Attending Cardiologist [facility]  12/19/2016, 10:45 AM

## 2016-12-19 NOTE — Progress Notes (Signed)
PT Cancellation Note  Patient Details Name: Dylan Quinn MRN: [id number] DOB: 1936/09/13   Cancelled Treatment:    Reason Eval/Treat Not Completed: Patient at procedure or test/unavailable. Pt currently being taken by transport to [facility] for procedure.  Wife did stop by PT in hallway to say that pt did take a few steps yesterday after PT left.  Will check back as schedule permits.   Willett Lefeber LUBECK 12/19/2016, 9:19 AM

## 2016-12-19 NOTE — Progress Notes (Signed)
    [facility] has been requested to perform a transesophageal echocardiogram on 12/27 for bacteremia.  After careful review of history and examination, the risks and benefits of transesophageal echocardiogram have been explained including risks of esophageal damage, perforation (1:10,000 risk), bleeding, pharyngeal hematoma as well as other potential complications associated with conscious sedation including aspiration, arrhythmia, respiratory failure and death. Alternatives to treatment were discussed, questions were answered. Patient is willing to proceed.   Lenoard Aden 12/19/2016 9:16 AM

## 2016-12-19 NOTE — Progress Notes (Signed)
Physical Therapy Treatment Patient Details Name: Dylan Quinn MRN: [id number] DOB: 1936/07/30 Today's Date: 12/19/2016    History of Present Illness 80 yo male admitted with diabetic ketoacidosis, +flu. Hx of DM, HTN, peripheral neuropathy, osteoporosis, prostate ca.    PT Comments    Pt able to ambulate ~35' in room with RW and MIN A which caused him to be very fatigued.  May need to consider SNF if he continues to be as fatigued as he is with mobility.    Follow Up Recommendations  Home health PT;Supervision/Assistance - 24 hour;SNF (may need to consider SNF if pt doesn't progress)     Equipment Recommendations  None recommended by PT    Recommendations for Other Services       Precautions / Restrictions Precautions Precautions: Fall Precaution Comments: droplet Restrictions Weight Bearing Restrictions: No    Mobility  Bed Mobility Overal bed mobility: Needs Assistance Bed Mobility: Supine to Sit     Supine to sit: Min guard     General bed mobility comments: increased time, but no physical assistance  Transfers Overall transfer level: Needs assistance Equipment used: Rolling walker (2 wheeled) Transfers: Sit to/from Stand Sit to Stand: Min guard         General transfer comment: Pt able to stand up with effort and with min/guard and no physical A.  Ambulation/Gait Ambulation/Gait assistance: Min assist Ambulation Distance (Feet): 35 Feet Assistive device: Rolling walker (2 wheeled) Gait Pattern/deviations: Step-through pattern;Decreased stride length;Trunk flexed Gait velocity: decreased   General Gait Details: Amb in room only with slow deliberate gait.   Stairs            Wheelchair Mobility    Modified Rankin (Stroke Patients Only)       Balance Overall balance assessment: Needs assistance Sitting-balance support: Feet supported Sitting balance-Leahy Scale: Good       Standing balance-Leahy Scale: Poor Standing balance  comment: requires UE support                    Cognition Arousal/Alertness: Awake/alert (but fatigued) Behavior During Therapy: WFL for tasks assessed/performed Overall Cognitive Status: Within Functional Limits for tasks assessed                      Exercises      General Comments General comments (skin integrity, edema, etc.): Pt very fatigued at end of gait      Pertinent Vitals/Pain Pain Assessment: Faces Faces Pain Scale: No hurt Pain Location: hx of B knee pain, but no c/o today    Home Living                      Prior Function            PT Goals (current goals can now be found in the care plan section) Acute Rehab PT Goals Patient Stated Goal: to feel better PT Goal Formulation: With patient Time For Goal Achievement: 12/27/16 Potential to Achieve Goals: Good Progress towards PT goals: Progressing toward goals    Frequency    Min 3X/week      PT Plan Current plan remains appropriate    Co-evaluation             End of Session Equipment Utilized During Treatment: Gait belt Activity Tolerance: Patient limited by fatigue Patient left: in chair;with call bell/phone within reach;with nursing/sitter in room;with family/visitor present     Time: [id number] PT Time Calculation (min) (ACUTE  ONLY): 17 min  Charges:  $Gait Training: 8-22 mins                    G Codes:      Marsa Matteo LUBECK 12/19/2016, 2:23 PM

## 2016-12-19 NOTE — Progress Notes (Signed)
OT Cancellation Note  Patient Details Name: Dylan Quinn MRN: [id number] DOB: 11/06/1936   Cancelled Treatment:    Reason Eval/Treat Not Completed: Patient at procedure or test/ unavailable  Parke Poisson B 12/19/2016, 10:20 AM

## 2016-12-19 NOTE — Progress Notes (Signed)
INFECTIOUS DISEASE PROGRESS NOTE  ID: Dylan Quinn is a 80 y.o. male with  Principal Problem:   Diabetic ketoacidosis without coma associated with type 1 diabetes mellitus ([place]) Active Problems:   Essential hypertension   URI (upper respiratory infection)   Nausea & vomiting   DKA, type 1 ([place])   Influenza with pneumonia   Staphylococcus aureus bacteremia  Subjective: Without complaints Having daily loose BM  Abtx:  Anti-infectives    Start     Dose/Rate Route Frequency Ordered Stop   12/18/16 1200  vancomycin (VANCOCIN) IVPB 1000 mg/200 mL premix     1,000 mg 200 mL/hr over 60 Minutes Intravenous Every 12 hours 12/18/16 1007     12/16/16 0800  vancomycin (VANCOCIN) 1,250 mg in sodium chloride 0.9 % 250 mL IVPB  Status:  Discontinued     1,250 mg 166.7 mL/hr over 90 Minutes Intravenous Every 12 hours 12/15/16 1846 12/18/16 1007   12/15/16 2000  ceFEPIme (MAXIPIME) 1 g in dextrose 5 % 50 mL IVPB  Status:  Discontinued     1 g 100 mL/hr over 30 Minutes Intravenous Every 8 hours 12/15/16 1836 12/17/16 1549   12/15/16 1900  vancomycin (VANCOCIN) 2,000 mg in sodium chloride 0.9 % 500 mL IVPB     2,000 mg 250 mL/hr over 120 Minutes Intravenous  Once 12/15/16 1846 12/15/16 2204   12/11/16 1000  oseltamivir (TAMIFLU) capsule 75 mg     75 mg Oral 2 times daily 12/11/16 0737 12/16/16 0959      Medications:  Scheduled: . brimonidine  1 drop Both Eyes BID   And  . timolol  1 drop Both Eyes BID  . calcium carbonate  400 mg of elemental calcium Oral Daily  . dorzolamide  1 drop Both Eyes BID  . enoxaparin (LOVENOX) injection  40 mg Subcutaneous Daily  . feeding supplement (GLUCERNA SHAKE)  237 mL Oral TID BM  . guaiFENesin  600 mg Oral BID  . insulin aspart  0-15 Units Subcutaneous TID WC  . insulin aspart  0-5 Units Subcutaneous QHS  . insulin glargine  30 Units Subcutaneous Daily  . irbesartan  300 mg Oral Daily  . latanoprost  1 drop Both Eyes QHS  . potassium  chloride  40 mEq Oral BID  . pregabalin  100 mg Oral BID  . vancomycin  1,000 mg Intravenous Q12H    Objective: Vital signs in last 24 hours: Temp:  [98.2 F (36.8 C)-99.7 F (37.6 C)] 98.3 F (36.8 C) (12/27 1409) Pulse Rate:  [86-134] 109 (12/27 1409) Resp:  [17-29] 20 (12/27 1409) BP: (126-187)/(67-111) 126/67 (12/27 1409) SpO2:  [92 %-97 %] 95 % (12/27 1409)   General appearance: alert, cooperative, fatigued and no distress Resp: clear to auscultation bilaterally Cardio: regular rate and rhythm GI: normal findings: bowel sounds normal and soft, non-tender  Lab Results  Recent Labs  12/17/16 0512  12/18/16 0912 12/19/16 0558  WBC 13.6*  --   --  15.0*  HGB 11.3*  --   --  10.9*  HCT 31.6*  --   --  31.3*  NA 132*  < > 132* 137  K 2.6*  < > 3.9 3.9  CL 98*  < > 99* 104  CO2 27  < > 24 25  BUN 13  < > 12 9  CREATININE 1.21  < > 1.17 0.95  < > = values in this interval not displayed. Liver Panel  Recent Labs  12/18/16 [id number]  12/19/16 0558  PROT 5.6* 5.6*  ALBUMIN 2.2* 2.4*  AST 19 15  ALT 10* 10*  ALKPHOS 75 72  BILITOT 0.9 0.9  BILIDIR 0.2  --   IBILI 0.7  --    Sedimentation Rate No results for input(s): ESRSEDRATE in the last 72 hours. C-Reactive Protein No results for input(s): CRP in the last 72 hours.  Microbiology: Recent Results (from the past 240 hour(s))  MRSA PCR Screening     Status: None   Collection Time: 12/11/16  5:29 AM  Result Value Ref Range Status   MRSA by PCR NEGATIVE NEGATIVE Final    Comment:        The GeneXpert MRSA Assay (FDA approved for NASAL specimens only), is one component of a comprehensive MRSA colonization surveillance program. It is not intended to diagnose MRSA infection nor to guide or monitor treatment for MRSA infections.   Culture, expectorated sputum-assessment     Status: None   Collection Time: 12/15/16  7:03 AM  Result Value Ref Range Status   Specimen Description SPUTUM  Final   Special  Requests Normal  Final   Sputum evaluation THIS SPECIMEN IS ACCEPTABLE FOR SPUTUM CULTURE  Final   Report Status 12/15/2016 FINAL  Final  Culture, blood (routine x 2) Call MD if unable to obtain prior to antibiotics being given     Status: None (Preliminary result)   Collection Time: 12/15/16  6:57 PM  Result Value Ref Range Status   Specimen Description BLOOD BLOOD RIGHT FOREARM  Final   Special Requests IN PEDIATRIC BOTTLE [place]  Final   Culture   Final    NO GROWTH 3 DAYS Performed at [facility]    Report Status PENDING  Incomplete  Culture, blood (routine x 2) Call MD if unable to obtain prior to antibiotics being given     Status: Abnormal   Collection Time: 12/15/16  7:14 PM  Result Value Ref Range Status   Specimen Description BLOOD RIGHT HAND  Final   Special Requests IN PEDIATRIC BOTTLE 1.5CC  Final   Culture  Setup Time   Final    PED GRAM POSITIVE COCCI IN CLUSTERS Organism ID to follow CRITICAL RESULT CALLED TO, READ BACK BY AND VERIFIED WITH: T. GREEN, PHARM AT [place] ON [id number] BY Rhea Bleacher Performed at [place] (A)  Final   Report Status 12/19/2016 FINAL  Final   Organism ID, Bacteria METHICILLIN RESISTANT STAPHYLOCOCCUS AUREUS  Final      Susceptibility   Methicillin resistant staphylococcus aureus - MIC*    CIPROFLOXACIN >=8 RESISTANT Resistant     ERYTHROMYCIN >=8 RESISTANT Resistant     GENTAMICIN <=0.5 SENSITIVE Sensitive     OXACILLIN >=4 RESISTANT Resistant     TETRACYCLINE <=1 SENSITIVE Sensitive     VANCOMYCIN <=0.5 SENSITIVE Sensitive     TRIMETH/SULFA <=10 SENSITIVE Sensitive     CLINDAMYCIN <=0.25 SENSITIVE Sensitive     RIFAMPIN <=0.5 SENSITIVE Sensitive     Inducible Clindamycin NEGATIVE Sensitive     * METHICILLIN RESISTANT STAPHYLOCOCCUS AUREUS  Blood Culture ID Panel (Reflexed)     Status: Abnormal   Collection Time: 12/15/16  7:14 PM  Result Value Ref Range Status    Enterococcus species NOT DETECTED NOT DETECTED Final   Listeria monocytogenes NOT DETECTED NOT DETECTED Final   Staphylococcus species DETECTED (A) NOT DETECTED Final    Comment: CRITICAL RESULT CALLED TO, READ BACK BY AND  VERIFIED WITH: T. GREEN, PHARM AT 0754 ON [id number] BY S. YARBROUGH    Staphylococcus aureus DETECTED (A) NOT DETECTED Final    Comment: CRITICAL RESULT CALLED TO, READ BACK BY AND VERIFIED WITH: T. GREEN, PHARM AT 0754 ON [id number] BY S. YARBROUGH    Methicillin resistance DETECTED (A) NOT DETECTED Final    Comment: CRITICAL RESULT CALLED TO, READ BACK BY AND VERIFIED WITH: T. GREEN, PHARM AT 0754 ON [id number] BY S. YARBROUGH    Streptococcus species NOT DETECTED NOT DETECTED Final   Streptococcus agalactiae NOT DETECTED NOT DETECTED Final   Streptococcus pneumoniae NOT DETECTED NOT DETECTED Final   Streptococcus pyogenes NOT DETECTED NOT DETECTED Final   Acinetobacter baumannii NOT DETECTED NOT DETECTED Final   Enterobacteriaceae species NOT DETECTED NOT DETECTED Final   Enterobacter cloacae complex NOT DETECTED NOT DETECTED Final   Escherichia coli NOT DETECTED NOT DETECTED Final   Klebsiella oxytoca NOT DETECTED NOT DETECTED Final   Klebsiella pneumoniae NOT DETECTED NOT DETECTED Final   Proteus species NOT DETECTED NOT DETECTED Final   Serratia marcescens NOT DETECTED NOT DETECTED Final   Haemophilus influenzae NOT DETECTED NOT DETECTED Final   Neisseria meningitidis NOT DETECTED NOT DETECTED Final   Pseudomonas aeruginosa NOT DETECTED NOT DETECTED Final   Candida albicans NOT DETECTED NOT DETECTED Final   Candida glabrata NOT DETECTED NOT DETECTED Final   Candida krusei NOT DETECTED NOT DETECTED Final   Candida parapsilosis NOT DETECTED NOT DETECTED Final   Candida tropicalis NOT DETECTED NOT DETECTED Final    Comment: Performed at [facility]  Culture, blood (Routine X 2) w Reflex to ID Panel     Status: None (Preliminary result)   Collection Time:  12/18/16  9:12 AM  Result Value Ref Range Status   Specimen Description BLOOD LEFT HAND  Final   Special Requests IN PEDIATRIC BOTTLE 3CC  Final   Culture   Final    NO GROWTH 1 DAY Performed at [facility]    Report Status PENDING  Incomplete    Studies/Results: Dg Chest Port 1 View  Result Date: 12/18/2016 CLINICAL DATA:  Shortness of Breath EXAM: PORTABLE CHEST 1 VIEW COMPARISON:  12/15/2016 FINDINGS: Cardiomediastinal silhouette is stable. Worsening reticular interstitial prominence bilaterally. There is persistent patchy airspace disease right lower lobe and left midlung. Worsening pneumonia or pulmonary edema cannot be excluded. IMPRESSION: Worsening reticular interstitial prominence bilaterally. There is persistent patchy airspace disease right lower lobe and left midlung. Worsening pneumonia or pulmonary edema cannot be excluded. Electronically Signed   By: Lahoma Crocker M.D.   On: 12/18/2016 13:37     Assessment/Plan: Influenza MRSA bacteremia  Continue vanco for 14 days total temps better TEE (-) for IE Await Repeat BCx (sent 12-26), ngtd Check with infection control on when to stop influenza isolation  Available as needed.   Total days of antibiotics: 4 vanco         [name] Infectious Diseases (pager) [phone] [email or web address] 12/19/2016, 6:25 PM  LOS: 6 days

## 2016-12-19 NOTE — Progress Notes (Signed)
PHARMACY NOTE -  Enoxaparin  Pharmacy has been assisting with dosing of Lovenox for VTE ppx. Dosage remains stable at 40 mg SQ daily and need for further dosage adjustment appears unlikely at present given relatively stable renal function and CBC.  Will sign off at this time.  Please reconsult if a change in clinical status warrants re-evaluation of dosage.  Reuel Boom, PharmD, BCPS Pager: [phone] 12/19/2016, 1:25 PM

## 2016-12-19 NOTE — H&P (Signed)
    INTERVAL PROCEDURE H&P  History and Physical Interval Note:  12/19/2016 9:43 AM  Dylan Quinn has presented today for their planned procedure. The various methods of treatment have been discussed with the patient and family. After consideration of risks, benefits and other options for treatment, the patient has consented to the procedure.  The patients' outpatient history has been reviewed, patient examined, and no change in status from most recent office note within the past 30 days. I have reviewed the patients' chart and labs and will proceed as planned. Questions were answered to the patient's satisfaction.   Pixie Casino, MD, [facility] Attending Cardiologist [place] C Magaly Pollina 12/19/2016, 9:43 AM

## 2016-12-19 NOTE — Progress Notes (Signed)
  Echocardiogram 2D Echocardiogram has been performed.  Darlina Sicilian M 12/19/2016, 1:19 PM

## 2016-12-20 ENCOUNTER — Telehealth: Payer: Self-pay | Admitting: Internal Medicine

## 2016-12-20 ENCOUNTER — Encounter (HOSPITAL_COMMUNITY): Payer: Self-pay | Admitting: Internal Medicine

## 2016-12-20 LAB — VANCOMYCIN, TROUGH: Vancomycin Tr: 20 ug/mL (ref 15–20)

## 2016-12-20 LAB — GLUCOSE, CAPILLARY
GLUCOSE-CAPILLARY: 373 mg/dL — AB (ref 65–99)
GLUCOSE-CAPILLARY: 216 mg/dL — AB (ref 65–99)
Glucose-Capillary: 279 mg/dL — ABNORMAL HIGH (ref 65–99)
Glucose-Capillary: 264 mg/dL — ABNORMAL HIGH (ref 65–99)

## 2016-12-20 LAB — EXPECTORATED SPUTUM ASSESSMENT W REFEX TO RESP CULTURE: SPECIAL REQUESTS: NORMAL

## 2016-12-20 LAB — EXPECTORATED SPUTUM ASSESSMENT W GRAM STAIN, RFLX TO RESP C

## 2016-12-20 NOTE — Plan of Care (Signed)
Problem: Nutrition: Goal: Adequate nutrition will be maintained Outcome: Not Met (add Reason) Patient has decreased appetite. Patient encouraged to take at least small bits of each meal. Family encouraged to bring foods that patient likes within the patient's diet.

## 2016-12-20 NOTE — Telephone Encounter (Signed)
I contacted the patient's wife. She wanted to advised you the patient had recently been in [facility] due to a bacterial infection in his blood stream, double pneumonia, the flu, and Ketoacidosis. She stated the patient is doing better, but she is going to be putting him in a rehab facility for about 2 to 3 weeks maybe longer because he is weak physically. She wanted you to be advised of this in case we receive any information on the patient.

## 2016-12-20 NOTE — Progress Notes (Signed)
[facility]  PROGRESS NOTE  PASON BRADNEY [id number] DOB: 28-Aug-1936 DOA: 12/11/2016 PCP: Tracie Harrier, MD   Brief HPI:   80 y/o with history of HTN and DM who presented to the hospital with + influenza A and DKA. Patient DKA resolved and influenza was treated with 5 days of tamiflu.  On 12/22 patient had 1 episode of possible hemoptysis and then another episode on 12/23.  Sputum culture was sent.  Patient underwent chest xray and showed possible pneumonia or hemorrhage.  Patient was then started on antibiotics for pneumonia. Now found to have MRSA bacteremia.    Subjective   Patient went to [facility] for TEE, which was negative for mural thrombus or valvular vegetation.Repeat blood cultures drawn on 12/18/2016 are negative to date.   Assessment/Plan:     1. Bacteremia with MRSA- patient underwent TEE today which was negative for valvular vegetation. Patient currently on vancomycin, infectious disease following. Repeat blood cultures sent on 12/18/2016 are negative to date. Patient will need 14 days of vancomycin, called and discussed with Dr. Johnnye Sima. Patient only had one set of blood cultures done on 12/18/2016, which is negative. Called and discussed Dr. Johnnye Sima who says it's okay to put a PICC line negative blood cultures 1 2. Diabetic ketoacidosis without coma- patient has type 1 diabetes mellitus, currently on Lantus 32 units every morning, hemoglobin A1c 10.4. Continue sliding scale insulin with NovoLog. 3. Healthcare associated pneumonia, influenza pneumonia- continue vancomycin, respiratory status is stable. Patient not requiring oxygen. 4. Hemoptysis- resolved, likely from above. 5. Hypertension-blood pressure stable, continue ARB's, when necessary hydralazine. 6. Influenza-completed course of Tamiflu 7.     DVT prophylaxis: Lovenox  Code Status: Full code  Family Communication: Discussed with daughter and wife at bedside  Disposition Plan: Likely home in next  1-2 days   Consultants:  Infectious disease   Procedures:  None   Continuous infusions . sodium chloride 50 mL/hr at 12/18/16 1552      Antibiotics:   Anti-infectives    Start     Dose/Rate Route Frequency Ordered Stop   12/18/16 1200  vancomycin (VANCOCIN) IVPB 1000 mg/200 mL premix     1,000 mg 200 mL/hr over 60 Minutes Intravenous Every 12 hours 12/18/16 1007     12/16/16 0800  vancomycin (VANCOCIN) 1,250 mg in sodium chloride 0.9 % 250 mL IVPB  Status:  Discontinued     1,250 mg 166.7 mL/hr over 90 Minutes Intravenous Every 12 hours 12/15/16 1846 12/18/16 1007   12/15/16 2000  ceFEPIme (MAXIPIME) 1 g in dextrose 5 % 50 mL IVPB  Status:  Discontinued     1 g 100 mL/hr over 30 Minutes Intravenous Every 8 hours 12/15/16 1836 12/17/16 1549   12/15/16 1900  vancomycin (VANCOCIN) 2,000 mg in sodium chloride 0.9 % 500 mL IVPB     2,000 mg 250 mL/hr over 120 Minutes Intravenous  Once 12/15/16 1846 12/15/16 2204   12/11/16 1000  oseltamivir (TAMIFLU) capsule 75 mg     75 mg Oral 2 times daily 12/11/16 0737 12/16/16 0959       Objective   Vitals:   12/19/16 1409 12/19/16 2048 12/20/16 0419 12/20/16 1425  BP: 126/67 (!) 150/58 (!) 169/73 120/61  Pulse: (!) 109  91 98  Resp: 20 18 20 18   Temp: 98.3 F (36.8 C) 99.2 F (37.3 C) 98.4 F (36.9 C) 98 F (36.7 C)  TempSrc: Oral Oral Oral Oral  SpO2: 95% 93% 93% 93%  Weight:  Height:        Intake/Output Summary (Last 24 hours) at 12/20/16 1525 Last data filed at 12/20/16 1001  Gross per 24 hour  Intake              480 ml  Output             1100 ml  Net             -620 ml   Filed Weights   12/11/16 0159  Weight: 97.5 kg (215 lb)     Physical Examination:  General exam: Appears calm and comfortable. Respiratory system: Clear to auscultation. Respiratory effort normal. Cardiovascular system:  RRR. No  murmurs, rubs, gallops. No pedal edema. GI system: Abdomen is nondistended, soft and nontender. No  organomegaly.  Central nervous system. No focal neurological deficits. 5 x 5 power in all extremities. Skin: No rashes, lesions or ulcers. Psychiatry: Alert, oriented x 3.Judgement and insight appear normal. Affect normal.    Data Reviewed: I have personally reviewed following labs and imaging studies  CBG:  Recent Labs Lab 12/19/16 1328 12/19/16 1641 12/19/16 2047 12/20/16 0720 12/20/16 1141  GLUCAP 151* 256* 273* 373* 279*    CBC:  Recent Labs Lab 12/15/16 1857 12/17/16 0512 12/19/16 0558  WBC 12.7* 13.6* 15.0*  NEUTROABS  --  11.0*  --   HGB 12.2* 11.3* 10.9*  HCT 34.9* 31.6* 31.3*  MCV 86.4 86.8 87.9  PLT 195 241 [id number]    Basic Metabolic Panel:  Recent Labs Lab 12/16/16 1102  12/17/16 0512 12/17/16 1211 12/17/16 1737 12/18/16 0525 12/18/16 0912 12/19/16 0558  NA 132*  < > 132* 133* 133*  --  132* 137  K 3.0*  < > 2.6* 3.1* 3.2*  --  3.9 3.9  CL 95*  < > 98* 98* 100*  --  99* 104  CO2 27  < > 27 26 26   --  24 25  GLUCOSE 216*  < > 162* 277* 173*  --  207* 160*  BUN 15  < > 13 15 14   --  12 9  CREATININE 1.14  < > 1.21 1.14 1.13  --  1.17 0.95  CALCIUM 7.8*  < > 7.9* 7.7* 7.9*  --  7.9* 8.3*  MG 1.5*  --   --  1.9  --  1.8  --   --   < > = values in this interval not displayed.  Recent Results (from the past 240 hour(s))  MRSA PCR Screening     Status: None   Collection Time: 12/11/16  5:29 AM  Result Value Ref Range Status   MRSA by PCR NEGATIVE NEGATIVE Final    Comment:        The GeneXpert MRSA Assay (FDA approved for NASAL specimens only), is one component of a comprehensive MRSA colonization surveillance program. It is not intended to diagnose MRSA infection nor to guide or monitor treatment for MRSA infections.   Culture, expectorated sputum-assessment     Status: None   Collection Time: 12/15/16  7:03 AM  Result Value Ref Range Status   Specimen Description SPUTUM  Final   Special Requests Normal  Final   Sputum evaluation   Final     THIS SPECIMEN IS ACCEPTABLE FOR SPUTUM CULTURE SPECIMEN LOST IN TRANSIT. NO CULTURE RESULT AVAILABLE Performed at [facility]    Report Status 12/20/2016 FINAL  Final  Culture, blood (routine x 2) Call MD if unable to obtain prior to antibiotics  being given     Status: None (Preliminary result)   Collection Time: 12/15/16  6:57 PM  Result Value Ref Range Status   Specimen Description BLOOD BLOOD RIGHT FOREARM  Final   Special Requests IN PEDIATRIC BOTTLE 2CC  Final   Culture   Final    NO GROWTH 4 DAYS Performed at [facility]    Report Status PENDING  Incomplete  Culture, blood (routine x 2) Call MD if unable to obtain prior to antibiotics being given     Status: Abnormal   Collection Time: 12/15/16  7:14 PM  Result Value Ref Range Status   Specimen Description BLOOD RIGHT HAND  Final   Special Requests IN PEDIATRIC BOTTLE 1.5CC  Final   Culture  Setup Time   Final    PED GRAM POSITIVE COCCI IN CLUSTERS Organism ID to follow CRITICAL RESULT CALLED TO, READ BACK BY AND VERIFIED WITH: T. GREEN, PHARM AT 0754 ON [id number] BY Rhea Bleacher Performed at [place] (A)  Final   Report Status 12/19/2016 FINAL  Final   Organism ID, Bacteria METHICILLIN RESISTANT STAPHYLOCOCCUS AUREUS  Final      Susceptibility   Methicillin resistant staphylococcus aureus - MIC*    CIPROFLOXACIN >=8 RESISTANT Resistant     ERYTHROMYCIN >=8 RESISTANT Resistant     GENTAMICIN <=0.5 SENSITIVE Sensitive     OXACILLIN >=4 RESISTANT Resistant     TETRACYCLINE <=1 SENSITIVE Sensitive     VANCOMYCIN <=0.5 SENSITIVE Sensitive     TRIMETH/SULFA <=10 SENSITIVE Sensitive     CLINDAMYCIN <=0.25 SENSITIVE Sensitive     RIFAMPIN <=0.5 SENSITIVE Sensitive     Inducible Clindamycin NEGATIVE Sensitive     * METHICILLIN RESISTANT STAPHYLOCOCCUS AUREUS  Blood Culture ID Panel (Reflexed)     Status: Abnormal   Collection Time: 12/15/16   7:14 PM  Result Value Ref Range Status   Enterococcus species NOT DETECTED NOT DETECTED Final   Listeria monocytogenes NOT DETECTED NOT DETECTED Final   Staphylococcus species DETECTED (A) NOT DETECTED Final    Comment: CRITICAL RESULT CALLED TO, READ BACK BY AND VERIFIED WITH: T. GREEN, PHARM AT 0754 ON [id number] BY S. YARBROUGH    Staphylococcus aureus DETECTED (A) NOT DETECTED Final    Comment: CRITICAL RESULT CALLED TO, READ BACK BY AND VERIFIED WITH: T. GREEN, PHARM AT 0754 ON [id number] BY S. YARBROUGH    Methicillin resistance DETECTED (A) NOT DETECTED Final    Comment: CRITICAL RESULT CALLED TO, READ BACK BY AND VERIFIED WITH: T. GREEN, PHARM AT 0754 ON [id number] BY S. YARBROUGH    Streptococcus species NOT DETECTED NOT DETECTED Final   Streptococcus agalactiae NOT DETECTED NOT DETECTED Final   Streptococcus pneumoniae NOT DETECTED NOT DETECTED Final   Streptococcus pyogenes NOT DETECTED NOT DETECTED Final   Acinetobacter baumannii NOT DETECTED NOT DETECTED Final   Enterobacteriaceae species NOT DETECTED NOT DETECTED Final   Enterobacter cloacae complex NOT DETECTED NOT DETECTED Final   Escherichia coli NOT DETECTED NOT DETECTED Final   Klebsiella oxytoca NOT DETECTED NOT DETECTED Final   Klebsiella pneumoniae NOT DETECTED NOT DETECTED Final   Proteus species NOT DETECTED NOT DETECTED Final   Serratia marcescens NOT DETECTED NOT DETECTED Final   Haemophilus influenzae NOT DETECTED NOT DETECTED Final   Neisseria meningitidis NOT DETECTED NOT DETECTED Final   Pseudomonas aeruginosa NOT DETECTED NOT DETECTED Final   Candida albicans NOT DETECTED NOT DETECTED Final   Candida  glabrata NOT DETECTED NOT DETECTED Final   Candida krusei NOT DETECTED NOT DETECTED Final   Candida parapsilosis NOT DETECTED NOT DETECTED Final   Candida tropicalis NOT DETECTED NOT DETECTED Final    Comment: Performed at [facility]  Culture, blood (Routine X 2) w Reflex to ID Panel     Status: None  (Preliminary result)   Collection Time: 12/18/16  9:12 AM  Result Value Ref Range Status   Specimen Description BLOOD LEFT HAND  Final   Special Requests IN PEDIATRIC BOTTLE 3CC  Final   Culture   Final    NO GROWTH 2 DAYS Performed at [facility]    Report Status PENDING  Incomplete     Liver Function Tests:  Recent Labs Lab 12/18/16 0917 12/19/16 0558  AST 19 15  ALT 10* 10*  ALKPHOS 75 72  BILITOT 0.9 0.9  PROT 5.6* 5.6*  ALBUMIN 2.2* 2.4*    Recent Labs Lab 12/18/16 0917  LIPASE 10*   No results for input(s): AMMONIA in the last 168 hours.  Cardiac Enzymes: No results for input(s): CKTOTAL, CKMB, CKMBINDEX, TROPONINI in the last 168 hours. BNP (last 3 results)  Recent Labs  12/15/16 1857  BNP 201.8*    ProBNP (last 3 results) No results for input(s): PROBNP in the last 8760 hours.    Studies: No results found.  Scheduled Meds: . brimonidine  1 drop Both Eyes BID   And  . timolol  1 drop Both Eyes BID  . calcium carbonate  400 mg of elemental calcium Oral Daily  . dorzolamide  1 drop Both Eyes BID  . enoxaparin (LOVENOX) injection  40 mg Subcutaneous Daily  . feeding supplement (GLUCERNA SHAKE)  237 mL Oral TID BM  . guaiFENesin  600 mg Oral BID  . insulin aspart  0-15 Units Subcutaneous TID WC  . insulin aspart  0-5 Units Subcutaneous QHS  . insulin glargine  30 Units Subcutaneous Daily  . irbesartan  300 mg Oral Daily  . latanoprost  1 drop Both Eyes QHS  . potassium chloride  40 mEq Oral BID  . pregabalin  100 mg Oral BID  . vancomycin  1,000 mg Intravenous Q12H      Time spent: 25 min  [facility] Pager [phone]. If 7PM-7AM, please contact night-coverage at [email or web address], Office  [phone]  password [email or web address] 12/20/2016, 3:25 PM  LOS: 7 days

## 2016-12-20 NOTE — Progress Notes (Signed)
Pharmacy Antibiotic Note  Dylan Quinn is a 80 y.o. male admitted on 12/11/2016 influenza pneumonia, subsequently found to have MRSA bacteremia.  Patient has finished a 5-day course of Tamiflu.  Empiric antibiotic regimen was changed to vancomycin monotherapy.  Patient is being followed by the Infectious Diseases service.  Pharmacy dosing assistance was requested for vancomycin.  Plan: Day 5/14 of vanc Continue vancomycin to 1000 mg IV q12h Will check vanc trough tonight prior to 5th dose since dose changed to 1g q12  Height: 6' (182.9 cm) Weight: 215 lb (97.5 kg) IBW/kg (Calculated) : 77.6  Temp (24hrs), Avg:98.7 F (37.1 C), Min:98.3 F (36.8 C), Max:99.2 F (37.3 C)   Recent Labs Lab 12/15/16 1857  12/17/16 0512 12/17/16 1211 12/17/16 1737 12/18/16 0912 12/19/16 0558  WBC 12.7*  --  13.6*  --   --   --  15.0*  CREATININE 0.76  < > 1.21 1.14 1.13 1.17 0.95  VANCOTROUGH  --   --   --   --   --  20  --   < > = values in this interval not displayed.  Estimated Creatinine Clearance: 77.6 mL/min (by C-G formula based on SCr of 0.95 mg/dL).    Allergies  Allergen Reactions  . Lisinopril Hives    hives    Antimicrobials this admission: 12/23 vanc >> 12/23 cefepime >>12/25   Microbiology results: 12/19 MRSA PCR screen: negative 12/23 BCx:   MRSA (by BCID) in 1 of 2 sets 12/23 Sputum: collected 12/26 BCx: ngtd   Thank you for allowing Pharmacy to participate in this patient's care.   Adrian Saran, PharmD, BCPS Pager [phone] 12/20/2016 8:47 AM

## 2016-12-20 NOTE — Telephone Encounter (Signed)
Pt was in the hospital and wife would like a call back to let us know the current status of the pt  She will be putting him in a SNF

## 2016-12-20 NOTE — Care Management Note (Signed)
Case Management Note  Patient Details  Name: Dylan Quinn MRN: [id number] Date of Birth: 1936-01-24  Subjective/Objective:  Awaiting Neg BCs so PICC can be placed. Anticipate Home with 14 days IV abx. Alerted Pam with [facility] so she can follow closely.                   Action/Plan:CM will follow closely for disposition/discharge needs.    Expected Discharge Date:   (unknown)               Expected Discharge Plan:  Home/Self Care  In-House Referral:  NA  Discharge planning Services  CM Consult  Post Acute Care Choice:  NA Choice offered to:  Patient  DME Arranged:  N/A DME Agency:  NA  HH Arranged:  NA HH Agency:  NA  Status of Service:  Completed, signed off  If discussed at [place] of Stay Meetings, dates discussed:    Additional Comments:  Delrae Sawyers, RN 12/20/2016, 10:57 AM

## 2016-12-20 NOTE — Evaluation (Signed)
Occupational Therapy Evaluation Patient Details Name: Dylan Quinn MRN: [id number] DOB: [birth year] Today's Date: 12/20/2016    History of Present Illness 80 yo male admitted with diabetic ketoacidosis, +flu. Hx of DM, HTN, peripheral neuropathy, osteoporosis, prostate ca.   Clinical Impression   Pt with decline in function and safety with ADLs and ADL mobility with decreased strength, balance and endurance. Pt is limited by fatigue and requires encouragement to increase participation; pt is self limiting. Pt was not sure that he had BM in bed and required assist for thorough hygiene in bathroom after another BM. Pt fatigues very easily and required a sitting  rest break after toileting and grooming at sink. Pt requires extensive assist with ADLs and +2 for mobility safety due to fatiguing easily with short distances using RW. Pt's HR increased to 112-113 during activity. Pt would benefit from acute OT services to address impairments to increase level of function and safety    Follow Up Recommendations  SNF    Equipment Recommendations  Other (comment) (TBD at next venue of care)    Recommendations for Other Services       Precautions / Restrictions Precautions Precautions: Fall Precaution Comments: droplet Restrictions Weight Bearing Restrictions: No      Mobility Bed Mobility Overal bed mobility: Needs Assistance Bed Mobility: Supine to Sit     Supine to sit: Min guard     General bed mobility comments: increased time  Transfers Overall transfer level: Needs assistance Equipment used: Rolling walker (2 wheeled) Transfers: Sit to/from Stand Sit to Stand: Min assist;+2 safety/equipment              Balance Overall balance assessment: Needs assistance Sitting-balance support: Feet supported Sitting balance-Leahy Scale: Good       Standing balance-Leahy Scale: Poor                              ADL Overall ADL's : Needs  assistance/impaired     Grooming: Wash/dry hands;Wash/dry face;Standing;Min guard   Upper Body Bathing: Minimal assistance;Sitting (simulated) Upper Body Bathing Details (indicate cue type and reason): due to fatigue Lower Body Bathing: Maximal assistance   Upper Body Dressing : Minimal assistance (due to fatigue)   Lower Body Dressing: Maximal assistance   Toilet Transfer: Minimal assistance;+2 for safety/equipment;RW;Ambulation;Grab bars;Comfort height toilet   Toileting- Clothing Manipulation and Hygiene: Moderate assistance;Sit to/from stand       Functional mobility during ADLs: Minimal assistance;+2 for safety/equipment General ADL Comments: Pt requires increased time and fatigues quickly     Vision Vision Assessment?: No apparent visual deficits              Pertinent Vitals/Pain Pain Assessment: No/denies pain     Hand Dominance Right   Extremity/Trunk Assessment Upper Extremity Assessment Upper Extremity Assessment: Generalized weakness   Lower Extremity Assessment Lower Extremity Assessment: Defer to PT evaluation   Cervical / Trunk Assessment Cervical / Trunk Assessment: Normal   Communication Communication Communication: No difficulties   Cognition Arousal/Alertness: Awake/alert Behavior During Therapy: WFL for tasks assessed/performed Overall Cognitive Status: Within Functional Limits for tasks assessed                     General Comments   pt pleasant and cooperative                 Home Living Family/patient expects to be discharged to:: Private residence Living Arrangements: Spouse/significant other  Available Help at Discharge: Family Type of Home: House Home Access: Stairs to enter   Entrance Stairs-Rails: None Home Layout: One level     Bathroom Shower/Tub: Occupational psychologist: Handicapped height     Home Equipment: Environmental consultant - 4 wheels;Cane - single point          Prior Functioning/Environment  Level of Independence: Independent with assistive device(s)    ADL's / Homemaking Assistance Needed: pt reports that he was independent with ADLs   Comments: ambulatory with cane usually. He has been using rollator when out in community due to bil knee pain, fatigue        OT Problem List: Decreased strength;Decreased activity tolerance;Impaired balance (sitting and/or standing);Decreased knowledge of use of DME or AE   OT Treatment/Interventions: Self-care/ADL training;DME and/or AE instruction;Therapeutic activities;Patient/family education    OT Goals(Current goals can be found in the care plan section) ADL Goals Pt Will Perform Grooming: with supervision;with set-up;standing Pt Will Perform Upper Body Bathing: with min guard assist;with supervision;with set-up;sitting Pt Will Perform Lower Body Bathing: with mod assist;sitting/lateral leans;sit to/from stand Pt Will Perform Upper Body Dressing: with min guard assist;with supervision;with set-up;sitting Pt Will Transfer to Toilet: with min guard assist;ambulating Pt Will Perform Toileting - Clothing Manipulation and hygiene: with min assist;sit to/from stand;sitting/lateral leans  OT Frequency: Min 2X/week   Barriers to D/C: Decreased caregiver support          Co-evaluation PT/OT/SLP Co-Evaluation/Treatment: Yes Reason for Co-Treatment: For patient/therapist safety   OT goals addressed during session: ADL's and self-care;Proper use of Adaptive equipment and DME      End of Session Equipment Utilized During Treatment: Gait belt;Rolling walker  Activity Tolerance: Patient limited by fatigue Patient left: in chair;with call bell/phone within reach   Time: 1342-1414 OT Time Calculation (min): 32 min Charges:  OT General Charges $OT Visit: 1 Procedure OT Evaluation $OT Eval Moderate Complexity: 1 Procedure G-Codes:    Britt Bottom 12/20/2016, 2:52 PM

## 2016-12-21 LAB — GLUCOSE, CAPILLARY
GLUCOSE-CAPILLARY: 262 mg/dL — AB (ref 65–99)
GLUCOSE-CAPILLARY: 212 mg/dL — AB (ref 65–99)
Glucose-Capillary: 281 mg/dL — ABNORMAL HIGH (ref 65–99)
Glucose-Capillary: 178 mg/dL — ABNORMAL HIGH (ref 65–99)

## 2016-12-21 LAB — CULTURE, BLOOD (ROUTINE X 2): Culture: NO GROWTH

## 2016-12-21 MED ORDER — TRAZODONE HCL 50 MG PO TABS
50.0000 mg | ORAL_TABLET | Freq: Every evening | ORAL | Status: DC | PRN
  Administered 2016-12-22: 50 mg via ORAL
  Filled 2016-12-21: qty 1

## 2016-12-21 MED ORDER — VANCOMYCIN HCL IN DEXTROSE 750-5 MG/150ML-% IV SOLN
750.0000 mg | Freq: Two times a day (BID) | INTRAVENOUS | Status: DC
  Administered 2016-12-21 – 2016-12-23 (×5): 750 mg via INTRAVENOUS
  Filled 2016-12-21 (×5): qty 150

## 2016-12-21 MED ORDER — SODIUM CHLORIDE 0.9% FLUSH
10.0000 mL | INTRAVENOUS | Status: DC | PRN
  Administered 2016-12-23 (×2): 10 mL
  Filled 2016-12-21 (×2): qty 40

## 2016-12-21 MED ORDER — ACETAMINOPHEN 325 MG PO TABS
650.0000 mg | ORAL_TABLET | Freq: Four times a day (QID) | ORAL | Status: DC | PRN
  Administered 2016-12-21 – 2016-12-22 (×2): 650 mg via ORAL
  Filled 2016-12-21 (×2): qty 2

## 2016-12-21 NOTE — Progress Notes (Signed)
[facility]  PROGRESS NOTE  Dylan Quinn [id number] DOB: [birth year] DOA: 12/11/2016 PCP: Tracie Harrier, MD   Brief HPI:   80 y/o with history of HTN and DM who presented to the hospital with + influenza A and DKA. Patient DKA resolved and influenza was treated with 5 days of tamiflu.  On 12/22 patient had 1 episode of possible hemoptysis and then another episode on 12/23.  Sputum culture was sent.  Patient underwent chest xray and showed possible pneumonia or hemorrhage.  Patient was then started on antibiotics for pneumonia. Now found to have MRSA bacteremia. Patient went to [facility] for TEE, which was negative for mural thrombus or valvular vegetation.Repeat blood cultures drawn on 12/18/2016 are negative to date.   Subjective   This morning he denies any chest pain , shortness of breath.   Assessment/Plan:     1. Bacteremia with MRSA- patient underwent TEE today which was negative for valvular vegetation. Patient currently on vancomycin, infectious disease following. Repeat blood cultures sent on 12/18/2016 are negative to date. Patient will need 14 days of vancomycin, called and discussed with Dr. Johnnye Sima. Patient only had one set of blood cultures done on 12/18/2016, which is negative. Called and discussed Dr. Johnnye Sima who says it's okay to put a PICC line with  negative blood cultures 1 2. Diabetic ketoacidosis without coma-  Resolved, patient has type 1 diabetes mellitus, currently on Lantus 32 units every morning, hemoglobin A1c 10.4. Continue sliding scale insulin with NovoLog. 3. Healthcare associated pneumonia, influenza pneumonia- continue vancomycin, respiratory status is stable. Patient not requiring oxygen. 4. Hemoptysis- resolved, likely from above. 5. Hypertension-blood pressure stable, continue ARB's, when necessary hydralazine. 6. Influenza-completed course of Tamiflu     DVT prophylaxis: Lovenox  Code Status: Full code  Family Communication: Discussed  with niece at bedside  Disposition Plan: SNF when bed is available   Consultants:  Infectious disease   Procedures:  None   Continuous infusions . sodium chloride 50 mL/hr at 12/18/16 1552      Antibiotics:   Anti-infectives    Start     Dose/Rate Route Frequency Ordered Stop   12/21/16 1200  vancomycin (VANCOCIN) IVPB 750 mg/150 ml premix     750 mg 150 mL/hr over 60 Minutes Intravenous Every 12 hours 12/21/16 0110     12/18/16 1200  vancomycin (VANCOCIN) IVPB 1000 mg/200 mL premix  Status:  Discontinued     1,000 mg 200 mL/hr over 60 Minutes Intravenous Every 12 hours 12/18/16 1007 12/21/16 0110   12/16/16 0800  vancomycin (VANCOCIN) 1,250 mg in sodium chloride 0.9 % 250 mL IVPB  Status:  Discontinued     1,250 mg 166.7 mL/hr over 90 Minutes Intravenous Every 12 hours 12/15/16 1846 12/18/16 1007   12/15/16 2000  ceFEPIme (MAXIPIME) 1 g in dextrose 5 % 50 mL IVPB  Status:  Discontinued     1 g 100 mL/hr over 30 Minutes Intravenous Every 8 hours 12/15/16 1836 12/17/16 1549   12/15/16 1900  vancomycin (VANCOCIN) 2,000 mg in sodium chloride 0.9 % 500 mL IVPB     2,000 mg 250 mL/hr over 120 Minutes Intravenous  Once 12/15/16 1846 12/15/16 2204   12/11/16 1000  oseltamivir (TAMIFLU) capsule 75 mg     75 mg Oral 2 times daily 12/11/16 0737 12/16/16 0959       Objective   Vitals:   12/20/16 2109 12/21/16 0510 12/21/16 1408 12/21/16 1426  BP: (!) 152/88 138/63 (!) 148/71 115/71  Pulse: 95  93 98 79  Resp: 18 18 18 18   Temp: 98.9 F (37.2 C) 98.8 F (37.1 C) 98.9 F (37.2 C) 98.3 F (36.8 C)  TempSrc: Oral Oral Oral Oral  SpO2: 95% 96% 96% 100%  Weight:      Height:        Intake/Output Summary (Last 24 hours) at 12/21/16 1600 Last data filed at 12/21/16 0942  Gross per 24 hour  Intake              240 ml  Output             1250 ml  Net            -1010 ml   Filed Weights   12/11/16 0159  Weight: 97.5 kg (215 lb)     Physical Examination:  General  exam: Appears calm and comfortable. Respiratory system: Clear to auscultation. Respiratory effort normal. Cardiovascular system:  RRR. No  murmurs, rubs, gallops. No pedal edema. GI system: Abdomen is nondistended, soft and nontender. No organomegaly.  Central nervous system. No focal neurological deficits. 5 x 5 power in all extremities. Skin: No rashes, lesions or ulcers. Psychiatry: Alert, oriented x 3.Judgement and insight appear normal. Affect normal.    Data Reviewed: I have personally reviewed following labs and imaging studies  CBG:  Recent Labs Lab 12/20/16 1141 12/20/16 1650 12/20/16 2108 12/21/16 0747 12/21/16 1142  GLUCAP 279* 264* 216* 281* 262*    CBC:  Recent Labs Lab 12/15/16 1857 12/17/16 0512 12/19/16 0558  WBC 12.7* 13.6* 15.0*  NEUTROABS  --  11.0*  --   HGB 12.2* 11.3* 10.9*  HCT 34.9* 31.6* 31.3*  MCV 86.4 86.8 87.9  PLT 195 241 [id number]    Basic Metabolic Panel:  Recent Labs Lab 12/16/16 1102  12/17/16 0512 12/17/16 1211 12/17/16 1737 12/18/16 0525 12/18/16 0912 12/19/16 0558  NA 132*  < > 132* 133* 133*  --  132* 137  K 3.0*  < > 2.6* 3.1* 3.2*  --  3.9 3.9  CL 95*  < > 98* 98* 100*  --  99* 104  CO2 27  < > 27 26 26   --  24 25  GLUCOSE 216*  < > 162* 277* 173*  --  207* 160*  BUN 15  < > 13 15 14   --  12 9  CREATININE 1.14  < > 1.21 1.14 1.13  --  1.17 0.95  CALCIUM 7.8*  < > 7.9* 7.7* 7.9*  --  7.9* 8.3*  MG 1.5*  --   --  1.9  --  1.8  --   --   < > = values in this interval not displayed.  Recent Results (from the past 240 hour(s))  Culture, expectorated sputum-assessment     Status: None   Collection Time: 12/15/16  7:03 AM  Result Value Ref Range Status   Specimen Description SPUTUM  Final   Special Requests Normal  Final   Sputum evaluation   Final    THIS SPECIMEN IS ACCEPTABLE FOR SPUTUM CULTURE SPECIMEN LOST IN TRANSIT. NO CULTURE RESULT AVAILABLE Performed at [facility]    Report Status 12/20/2016 FINAL   Final  Culture, blood (routine x 2) Call MD if unable to obtain prior to antibiotics being given     Status: None   Collection Time: 12/15/16  6:57 PM  Result Value Ref Range Status   Specimen Description BLOOD BLOOD RIGHT FOREARM  Final   Special Requests  IN PEDIATRIC BOTTLE 2CC  Final   Culture   Final    NO GROWTH 5 DAYS Performed at [facility]    Report Status 12/21/2016 FINAL  Final  Culture, blood (routine x 2) Call MD if unable to obtain prior to antibiotics being given     Status: Abnormal   Collection Time: 12/15/16  7:14 PM  Result Value Ref Range Status   Specimen Description BLOOD RIGHT HAND  Final   Special Requests IN PEDIATRIC BOTTLE 1.5CC  Final   Culture  Setup Time   Final    PED GRAM POSITIVE COCCI IN CLUSTERS Organism ID to follow CRITICAL RESULT CALLED TO, READ BACK BY AND VERIFIED WITH: T. GREEN, PHARM AT 0754 ON [id number] BY Rhea Bleacher Performed at [place] (A)  Final   Report Status 12/19/2016 FINAL  Final   Organism ID, Bacteria METHICILLIN RESISTANT STAPHYLOCOCCUS AUREUS  Final      Susceptibility   Methicillin resistant staphylococcus aureus - MIC*    CIPROFLOXACIN >=8 RESISTANT Resistant     ERYTHROMYCIN >=8 RESISTANT Resistant     GENTAMICIN <=0.5 SENSITIVE Sensitive     OXACILLIN >=4 RESISTANT Resistant     TETRACYCLINE <=1 SENSITIVE Sensitive     VANCOMYCIN <=0.5 SENSITIVE Sensitive     TRIMETH/SULFA <=10 SENSITIVE Sensitive     CLINDAMYCIN <=0.25 SENSITIVE Sensitive     RIFAMPIN <=0.5 SENSITIVE Sensitive     Inducible Clindamycin NEGATIVE Sensitive     * METHICILLIN RESISTANT STAPHYLOCOCCUS AUREUS  Blood Culture ID Panel (Reflexed)     Status: Abnormal   Collection Time: 12/15/16  7:14 PM  Result Value Ref Range Status   Enterococcus species NOT DETECTED NOT DETECTED Final   Listeria monocytogenes NOT DETECTED NOT DETECTED Final   Staphylococcus species DETECTED (A) NOT  DETECTED Final    Comment: CRITICAL RESULT CALLED TO, READ BACK BY AND VERIFIED WITH: T. GREEN, PHARM AT 0754 ON [id number] BY S. YARBROUGH    Staphylococcus aureus DETECTED (A) NOT DETECTED Final    Comment: CRITICAL RESULT CALLED TO, READ BACK BY AND VERIFIED WITH: T. GREEN, PHARM AT 0754 ON [id number] BY S. YARBROUGH    Methicillin resistance DETECTED (A) NOT DETECTED Final    Comment: CRITICAL RESULT CALLED TO, READ BACK BY AND VERIFIED WITH: T. GREEN, PHARM AT 0754 ON [id number] BY S. YARBROUGH    Streptococcus species NOT DETECTED NOT DETECTED Final   Streptococcus agalactiae NOT DETECTED NOT DETECTED Final   Streptococcus pneumoniae NOT DETECTED NOT DETECTED Final   Streptococcus pyogenes NOT DETECTED NOT DETECTED Final   Acinetobacter baumannii NOT DETECTED NOT DETECTED Final   Enterobacteriaceae species NOT DETECTED NOT DETECTED Final   Enterobacter cloacae complex NOT DETECTED NOT DETECTED Final   Escherichia coli NOT DETECTED NOT DETECTED Final   Klebsiella oxytoca NOT DETECTED NOT DETECTED Final   Klebsiella pneumoniae NOT DETECTED NOT DETECTED Final   Proteus species NOT DETECTED NOT DETECTED Final   Serratia marcescens NOT DETECTED NOT DETECTED Final   Haemophilus influenzae NOT DETECTED NOT DETECTED Final   Neisseria meningitidis NOT DETECTED NOT DETECTED Final   Pseudomonas aeruginosa NOT DETECTED NOT DETECTED Final   Candida albicans NOT DETECTED NOT DETECTED Final   Candida glabrata NOT DETECTED NOT DETECTED Final   Candida krusei NOT DETECTED NOT DETECTED Final   Candida parapsilosis NOT DETECTED NOT DETECTED Final   Candida tropicalis NOT DETECTED NOT DETECTED Final    Comment:  Performed at [facility]  Culture, blood (Routine X 2) w Reflex to ID Panel     Status: None (Preliminary result)   Collection Time: 12/18/16  9:12 AM  Result Value Ref Range Status   Specimen Description BLOOD LEFT HAND  Final   Special Requests IN PEDIATRIC BOTTLE 3CC  Final   Culture    Final    NO GROWTH 3 DAYS Performed at [facility]    Report Status PENDING  Incomplete     Liver Function Tests:  Recent Labs Lab 12/18/16 0917 12/19/16 0558  AST 19 15  ALT 10* 10*  ALKPHOS 75 72  BILITOT 0.9 0.9  PROT 5.6* 5.6*  ALBUMIN 2.2* 2.4*    Recent Labs Lab 12/18/16 0917  LIPASE 10*   No results for input(s): AMMONIA in the last 168 hours.  Cardiac Enzymes: No results for input(s): CKTOTAL, CKMB, CKMBINDEX, TROPONINI in the last 168 hours. BNP (last 3 results)  Recent Labs  12/15/16 1857  BNP 201.8*    ProBNP (last 3 results) No results for input(s): PROBNP in the last 8760 hours.    Studies: No results found.  Scheduled Meds: . brimonidine  1 drop Both Eyes BID   And  . timolol  1 drop Both Eyes BID  . calcium carbonate  400 mg of elemental calcium Oral Daily  . dorzolamide  1 drop Both Eyes BID  . enoxaparin (LOVENOX) injection  40 mg Subcutaneous Daily  . feeding supplement (GLUCERNA SHAKE)  237 mL Oral TID BM  . guaiFENesin  600 mg Oral BID  . insulin aspart  0-15 Units Subcutaneous TID WC  . insulin aspart  0-5 Units Subcutaneous QHS  . insulin glargine  30 Units Subcutaneous Daily  . irbesartan  300 mg Oral Daily  . latanoprost  1 drop Both Eyes QHS  . potassium chloride  40 mEq Oral BID  . pregabalin  100 mg Oral BID  . vancomycin  750 mg Intravenous Q12H      Time spent: 25 min  [facility] Pager [phone]. If 7PM-7AM, please contact night-coverage at [email or web address], Office  [phone]  password [email or web address] 12/21/2016, 4:00 PM  LOS: 8 days

## 2016-12-21 NOTE — Progress Notes (Signed)
Pharmacy Antibiotic Note  Dylan Quinn is a 80 y.o. male admitted on 12/11/2016 influenza pneumonia, subsequently found to have MRSA bacteremia.  Patient has finished a 5-day course of Tamiflu.  Empiric antibiotic regimen was changed to vancomycin monotherapy.  Patient is being followed by the Infectious Diseases service.  Pharmacy dosing assistance was requested for vancomycin.  Plan: Day 6/14 of vanc VT=20 mg/L on 1Gm IV q12h at Css Scr stable improved Will decrease Vancomycin to 750 mg IV q12h since at upper end of therapeutic range (VT=15-20 mg/L)  Height: 6' (182.9 cm) Weight: 215 lb (97.5 kg) IBW/kg (Calculated) : 77.6  Temp (24hrs), Avg:98.4 F (36.9 C), Min:98 F (36.7 C), Max:98.9 F (37.2 C)   Recent Labs Lab 12/15/16 1857  12/17/16 0512 12/17/16 1211 12/17/16 1737 12/18/16 0912 12/19/16 0558 12/20/16 2316  WBC 12.7*  --  13.6*  --   --   --  15.0*  --   CREATININE 0.76  < > 1.21 1.14 1.13 1.17 0.95  --   VANCOTROUGH  --   --   --   --   --  20  --  20  < > = values in this interval not displayed.  Estimated Creatinine Clearance: 77.6 mL/min (by C-G formula based on SCr of 0.95 mg/dL).    Allergies  Allergen Reactions  . Lisinopril Hives    hives    Antimicrobials this admission: 12/23 vanc >> 12/23 cefepime >>12/25   Microbiology results: 12/19 MRSA PCR screen: negative 12/23 BCx:   MRSA (by BCID) in 1 of 2 sets 12/23 Sputum: collected 12/26 BCx: ngtd   Thank you for allowing Pharmacy to participate in this patient's care.  Dorrene German 12/21/2016 12:37 AM

## 2016-12-21 NOTE — Progress Notes (Signed)
Peripherally Inserted Central Catheter/Midline Placement  The IV Nurse has discussed with the patient and/or persons authorized to consent for the patient, the purpose of this procedure and the potential benefits and risks involved with this procedure.  The benefits include less needle sticks, lab draws from the catheter, and the patient may be discharged home with the catheter. Risks include, but not limited to, infection, bleeding, blood clot (thrombus formation), and puncture of an artery; nerve damage and irregular heartbeat and possibility to perform a PICC exchange if needed/ordered by physician.  Alternatives to this procedure were also discussed.  Bard Power PICC patient education guide, fact sheet on infection prevention and patient information card has been provided to patient /or left at bedside.    PICC/Midline Placement Documentation        Dylan Quinn 12/21/2016, 11:12 AM

## 2016-12-21 NOTE — Progress Notes (Signed)
Faxed clinical information,  List of bed offers given.  2:00pm (Family wants daughter to make decision for rehab placement.)   LCSWA met with patient, daughter-Monique Gift [phone] and spouse at bedside. LCSWA provided patient daughter and spouse with bed offers. Patient daughter flew from [place] today, to assist with rehab placement. She expresses an opportunity to view facilities and will have a decision by 12/30 and has requested to talk with physician about patient care.  LCSWA informed family there will be a weekend CSW to follow up with patient and family tomorrow.   Kathrin Greathouse, Latanya Presser, MSW Clinical Social Worker 5E and Psychiatric Service Line [phone] 12/21/2016  4:23 PM

## 2016-12-21 NOTE — NC FL2 (Signed)
[place] LEVEL OF CARE SCREENING TOOL     IDENTIFICATION  Patient Name: Dylan Quinn Birthdate: Jun 27, 1936 Sex: male Admission Date (Current Location): 12/11/2016  [facility] [place] Number:  Herbalist and Address:  [facility],  [place] [address], [place]      Provider Number: [id number]  Attending Physician Name and Address:  Oswald Hillock, MD  Relative Name and Phone Number:       Current Level of Care: Hospital Recommended Level of Care: [place] Prior Approval Number:    Date Approved/Denied:   PASRR Number:   [id number]   Discharge Plan: SNF    Current Diagnoses: Patient Active Problem List   Diagnosis Date Noted  . Influenza with pneumonia   . Staphylococcus aureus bacteremia   . Diabetic ketoacidosis without coma associated with type 1 diabetes mellitus ([place]) 12/11/2016  . Nausea & vomiting 12/11/2016  . DKA, type 1 ([place]) 12/11/2016  . Right knee pain 04/18/2016  . Primary osteoarthritis of right knee 04/18/2016  . COPD (chronic obstructive pulmonary disease) ([place]) 12/22/2015  . Type 1 diabetes mellitus with nephropathy ([place]) 12/12/2015  . URI (upper respiratory infection) 12/09/2015  . Normocytic anemia 05/04/2013  . Insomnia 07/28/2012  . HEARING LOSS 02/14/2010  . PROTEINURIA, MILD 02/14/2010  . GLAUCOMA 07/19/2009  . Prostate cancer ([place]) 07/19/2009  . Essential hypertension 05/14/2008  . Other osteoporosis 12/16/2007  . 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 PERIPHERAL NEUROPATHY 06/30/2007    Orientation RESPIRATION BLADDER Height & Weight     Self, Time, Situation, Place  Normal Continent Weight: 215 lb (97.5 kg) Height:  6' (182.9 cm)  BEHAVIORAL SYMPTOMS/MOOD NEUROLOGICAL BOWEL NUTRITION STATUS      Continent Diet (Carb Modified/ [place] Healthy )  AMBULATORY STATUS COMMUNICATION OF NEEDS Skin   Extensive Assist Verbally Normal                        Personal Care Assistance Level of Assistance  Bathing, Feeding, Dressing Bathing Assistance: Limited assistance Feeding assistance: Independent Dressing Assistance: Limited assistance     Functional Limitations Info  Sight, Speech, Hearing Sight Info: Impaired Hearing Info: Impaired Speech Info: Adequate    SPECIAL CARE FACTORS FREQUENCY  PT (By licensed PT), OT (By licensed OT)     PT Frequency: 5 OT Frequency: 5            Contractures Contractures Info: Not present    Additional Factors Info  Code Status, Allergies, Isolation Precautions, Insulin Sliding Scale Code Status Info: Full Code Allergies Info: Lisinopril   Insulin Sliding Scale Info: insulin aspart (novoLOG) injection 0-15 Units, insulin glargine (LANTUS) injection 30 Units, insulin aspart (novoLOG) injection 0-5 Units Isolation Precautions Info: MRSA     Current Medications (12/21/2016):  This is the current hospital active medication list Current Facility-Administered Medications  Medication Dose Route Frequency Provider Last Rate Last Dose  . 0.9 %  sodium chloride infusion   Intravenous Continuous Eber Jones, MD 50 mL/hr at 12/18/16 1552    . benzonatate (TESSALON) capsule 100 mg  100 mg Oral BID PRN Eber Jones, MD   100 mg at 12/20/16 2240  . brimonidine (ALPHAGAN) 0.2 % ophthalmic solution 1 drop  1 drop Both Eyes BID Rise Patience, MD   1 drop at 12/20/16 2243   And  . timolol (TIMOPTIC) 0.5 % ophthalmic solution 1 drop  1  drop Both Eyes BID Rise Patience, MD   1 drop at 12/20/16 2243  . calcium carbonate (TUMS - dosed in mg elemental calcium) chewable tablet 400 mg of elemental calcium  400 mg of elemental calcium Oral Daily Eber Jones, MD   400 mg of elemental calcium at 12/20/16 1004  . diphenhydrAMINE (BENADRYL) capsule 25 mg  25 mg Oral QHS PRN Gardiner Barefoot, NP   25 mg at 12/19/16 0122  . dorzolamide (TRUSOPT) 2 %  ophthalmic solution 1 drop  1 drop Both Eyes BID Rise Patience, MD   1 drop at 12/20/16 2245  . enoxaparin (LOVENOX) injection 40 mg  40 mg Subcutaneous Daily Minda Ditto, RPH   40 mg at 12/20/16 1002  . feeding supplement (GLUCERNA SHAKE) (GLUCERNA SHAKE) liquid 237 mL  237 mL Oral TID BM Velvet Bathe, MD   237 mL at 12/20/16 1400  . guaiFENesin (MUCINEX) 12 hr tablet 600 mg  600 mg Oral BID Velvet Bathe, MD   600 mg at 12/20/16 1005  . hydrALAZINE (APRESOLINE) injection 5 mg  5 mg Intravenous Q6H PRN Velvet Bathe, MD   5 mg at 12/12/16 0914  . insulin aspart (novoLOG) injection 0-15 Units  0-15 Units Subcutaneous TID WC Velvet Bathe, MD   8 Units at 12/21/16 0803  . insulin aspart (novoLOG) injection 0-5 Units  0-5 Units Subcutaneous QHS Velvet Bathe, MD   2 Units at 12/20/16 2232  . insulin glargine (LANTUS) injection 30 Units  30 Units Subcutaneous Daily Eber Jones, MD   30 Units at 12/20/16 1002  . irbesartan (AVAPRO) tablet 300 mg  300 mg Oral Daily Eber Jones, MD   300 mg at 12/20/16 1005  . latanoprost (XALATAN) 0.005 % ophthalmic solution 1 drop  1 drop Both Eyes QHS Rise Patience, MD   1 drop at 12/20/16 2245  . lip balm (CARMEX) ointment   Topical PRN Velvet Bathe, MD      . ondansetron [facility]) injection 4 mg  4 mg Intravenous Q6H PRN Velvet Bathe, MD   4 mg at 12/16/16 [id number]  . ondansetron (ZOFRAN-ODT) disintegrating tablet 4 mg  4 mg Oral Q8H PRN Eber Jones, MD      . potassium chloride SA (K-DUR,KLOR-CON) CR tablet 40 mEq  40 mEq Oral BID Reyne Dumas, MD   40 mEq at 12/20/16 2231  . pregabalin (LYRICA) capsule 100 mg  100 mg Oral BID Rise Patience, MD   100 mg at 12/20/16 2231  . sodium chloride flush (NS) 0.9 % injection 10-40 mL  10-40 mL Intracatheter PRN Oswald Hillock, MD      . vancomycin (VANCOCIN) IVPB 750 mg/150 ml premix  750 mg Intravenous Q12H Dorrene German, RPH      . zolpidem (AMBIEN) tablet 5 mg  5 mg Oral QHS PRN  Oswald Hillock, MD   5 mg at 12/20/16 2236     Discharge Medications: Please see discharge summary for a list of discharge medications.  Relevant Imaging Results:  Relevant Lab Results:   Additional Information [place], LCSW

## 2016-12-21 NOTE — Telephone Encounter (Signed)
Noted. So sorry to hear about his illness.

## 2016-12-21 NOTE — Progress Notes (Signed)
Inpatient Diabetes Program Recommendations  AACE/ADA: New Consensus Statement on Inpatient Glycemic Control (2015)  Target Ranges:  Prepandial:   less than 140 mg/dL      Peak postprandial:   less than 180 mg/dL (1-2 hours)      Critically ill patients:  140 - 180 mg/dL   Results for JEANPAUL, PIEROTTI (MRN [id number]) as of 12/21/2016 09:27  Ref. Range 12/20/2016 07:20 12/20/2016 11:41 12/20/2016 16:50 12/20/2016 21:08  Glucose-Capillary Latest Ref Range: 65 - 99 mg/dL 373 (H) 279 (H) 264 (H) 216 (H)   Results for CARRY, MCREA (MRN [id number]) as of 12/21/2016 09:27  Ref. Range 12/21/2016 07:47  Glucose-Capillary Latest Ref Range: 65 - 99 mg/dL 281 (H)    Home DM Meds: Lantus 22 units daily                             Humalog 7-15 units TID per SSI  Current Insulin Orders: Lantus 30 units daily                                       Novolog Moderate Correction Scale/ SSI (0-15 units) TID AC + HS     MD- Please consider the following in-hospital insulin adjustments:  1. Increase Lantus to 35 units daily  2. Start Novolog Meal Coverage: Novolog 4 units TID with meals (hold if pt eats <50% of meal)    --Will follow patient during hospitalization--  Wyn Quaker RN, MSN, CDE Diabetes Coordinator Inpatient Glycemic Control Team Team Pager: [phone] (8a-5p)

## 2016-12-22 DIAGNOSIS — R112 Nausea with vomiting, unspecified: Secondary | ICD-10-CM

## 2016-12-22 LAB — GLUCOSE, CAPILLARY
GLUCOSE-CAPILLARY: 215 mg/dL — AB (ref 65–99)
Glucose-Capillary: 191 mg/dL — ABNORMAL HIGH (ref 65–99)
Glucose-Capillary: 179 mg/dL — ABNORMAL HIGH (ref 65–99)

## 2016-12-22 NOTE — Clinical Social Work Note (Signed)
SW spoke with patient's daughter multiple times today; family insisted on touring faclities this afternoon before agreeing to decide on bed offer.  Family has chosen [facility] and they have a private room available. SW spoke with Rollene Fare, Admissions who indicated that they could accept patient tomorrow.  Patient will be stable for d/c tomorrow per Dr. Darrick Meigs.  Lorie Phenix. Pauline Good, [place]  (weekend coverage)

## 2016-12-22 NOTE — Clinical Social Work Placement (Signed)
   CLINICAL SOCIAL WORK PLACEMENT  NOTE  Date:  12/22/2016  Patient Details  Name: RIAZ SHOSTAK MRN: [id number] Date of Birth: [birth year]  Clinical Social Work is seeking post-discharge placement for this patient at the [place] level of care (*CSW will initial, date and re-position this form in  chart as items are completed):      Patient/family provided with [place] Work Department's list of facilities offering this level of care within the geographic area requested by the patient (or if unable, by the patient's family).  Yes   Patient/family informed of their freedom to choose among providers that offer the needed level of care, that participate in Medicare, Medicaid or managed care program needed by the patient, have an available bed and are willing to accept the patient.  Yes   Patient/family informed of [place]'s ownership interest in [facility] and [facility], as well as of the fact that they are under no obligation to receive care at these facilities.  PASRR submitted to EDS on 12/21/16     PASRR number received on 12/21/16     Existing PASRR number confirmed on       FL2 transmitted to all facilities in geographic area requested by pt/family on 12/21/16     FL2 transmitted to all facilities within larger geographic area on       Patient informed that his/her managed care company has contracts with or will negotiate with certain facilities, including the following:   [facility])     Yes   Patient/family informed of bed offers received.  Patient chooses bed at [facility]     Physician recommends and patient chooses bed at      Patient to be transferred to [facility] on  .  Patient to be transferred to facility by [facility])     Patient family notified on   of transfer.  Name of family member notified:        PHYSICIAN Please sign FL2, Please prepare priority discharge  summary, including medications, Please prepare prescriptions     Additional Comment:    _______________________________________________ Williemae Area, LCSW 12/22/2016, 9:48 PM

## 2016-12-22 NOTE — Progress Notes (Signed)
[facility]  PROGRESS NOTE  [address] [id number] DOB: January 14, 1936 DOA: 12/11/2016 PCP: Tracie Harrier, MD   Brief HPI:   80 y/o with history of HTN and DM who presented to the hospital with + influenza A and DKA. Patient DKA resolved and influenza was treated with 5 days of tamiflu.  On 12/22 patient had 1 episode of possible hemoptysis and then another episode on 12/23.  Sputum culture was sent.  Patient underwent chest xray and showed possible pneumonia or hemorrhage.  Patient was then started on antibiotics for pneumonia. Now found to have MRSA bacteremia. Patient went to [facility] for TEE, which was negative for mural thrombus or valvular vegetation.Repeat blood cultures drawn on 12/18/2016 are negative to date.   Subjective   This morning he denies any chest pain , shortness of breath.   Assessment/Plan:     1. Bacteremia with MRSA- patient underwent TEE today which was negative for valvular vegetation. Patient currently on vancomycin, infectious disease following. Repeat blood cultures sent on 12/18/2016 are negative to date. Patient will need 14 days of vancomycin, called and discussed with Dr. Johnnye Sima. Patient only had one set of blood cultures done on 12/18/2016, which is negative. Called and discussed Dr. Johnnye Sima who says it's okay to put a PICC line with  negative blood cultures 1. Picc line placed, patient to be discharged to SNF on antibiotics  when bed available. 2. Diabetic ketoacidosis without coma-  Resolved, patient has type 1 diabetes mellitus, currently on Lantus 32 units every morning, hemoglobin A1c 10.4. Continue sliding scale insulin with NovoLog. 3. Healthcare associated pneumonia, influenza pneumonia- continue vancomycin, respiratory status is stable. Patient not requiring oxygen. 4. Hemoptysis- resolved, likely from above. 5. Hypertension-blood pressure stable, continue ARB's, when necessary hydralazine. 6. Influenza-completed course of  Tamiflu     DVT prophylaxis: Lovenox  Code Status: Full code  Family Communication: Discussed with niece at bedside  Disposition Plan: SNF when bed is available in next 24-48 hrs.   Consultants:  Infectious disease   Procedures:  None   Continuous infusions . sodium chloride 50 mL/hr at 12/18/16 1552      Antibiotics:   Anti-infectives    Start     Dose/Rate Route Frequency Ordered Stop   12/21/16 1200  vancomycin (VANCOCIN) IVPB 750 mg/150 ml premix     750 mg 150 mL/hr over 60 Minutes Intravenous Every 12 hours 12/21/16 0110     12/18/16 1200  vancomycin (VANCOCIN) IVPB 1000 mg/200 mL premix  Status:  Discontinued     1,000 mg 200 mL/hr over 60 Minutes Intravenous Every 12 hours 12/18/16 1007 12/21/16 0110   12/16/16 0800  vancomycin (VANCOCIN) 1,250 mg in sodium chloride 0.9 % 250 mL IVPB  Status:  Discontinued     1,250 mg 166.7 mL/hr over 90 Minutes Intravenous Every 12 hours 12/15/16 1846 12/18/16 1007   12/15/16 2000  ceFEPIme (MAXIPIME) 1 g in dextrose 5 % 50 mL IVPB  Status:  Discontinued     1 g 100 mL/hr over 30 Minutes Intravenous Every 8 hours 12/15/16 1836 12/17/16 1549   12/15/16 1900  vancomycin (VANCOCIN) 2,000 mg in sodium chloride 0.9 % 500 mL IVPB     2,000 mg 250 mL/hr over 120 Minutes Intravenous  Once 12/15/16 1846 12/15/16 2204   12/11/16 1000  oseltamivir (TAMIFLU) capsule 75 mg     75 mg Oral 2 times daily 12/11/16 0737 12/16/16 0959       Objective   Vitals:  12/21/16 1944 12/21/16 2207 12/22/16 0500 12/22/16 1359  BP:  129/83 (!) 166/89 138/72  Pulse:  90 91 95  Resp:  19 18 18   Temp: 98.1 F (36.7 C) 98.7 F (37.1 C) 98.7 F (37.1 C) 98.5 F (36.9 C)  TempSrc: Oral Oral Oral Oral  SpO2:  96% 96% 98%  Weight:      Height:        Intake/Output Summary (Last 24 hours) at 12/22/16 1645 Last data filed at 12/22/16 1635  Gross per 24 hour  Intake           1412.5 ml  Output             2640 ml  Net          -1227.5  ml   Filed Weights   12/11/16 0159  Weight: 97.5 kg (215 lb)     Physical Examination:  General exam: Appears calm and comfortable. Respiratory system: Clear to auscultation. Respiratory effort normal. Cardiovascular system:  RRR. No  murmurs, rubs, gallops. No pedal edema. GI system: Abdomen is nondistended, soft and nontender. No organomegaly.  Central nervous system. No focal neurological deficits. 5 x 5 power in all extremities. Skin: No rashes, lesions or ulcers. Psychiatry: Alert, oriented x 3.Judgement and insight appear normal. Affect normal.    Data Reviewed: I have personally reviewed following labs and imaging studies  CBG:  Recent Labs Lab 12/21/16 1142 12/21/16 1700 12/21/16 2152 12/22/16 0802 12/22/16 1212  GLUCAP 262* 212* 178* 215* 191*    CBC:  Recent Labs Lab 12/15/16 1857 12/17/16 0512 12/19/16 0558  WBC 12.7* 13.6* 15.0*  NEUTROABS  --  11.0*  --   HGB 12.2* 11.3* 10.9*  HCT 34.9* 31.6* 31.3*  MCV 86.4 86.8 87.9  PLT 195 241 [id number]    Basic Metabolic Panel:  Recent Labs Lab 12/16/16 1102  12/17/16 0512 12/17/16 1211 12/17/16 1737 12/18/16 0525 12/18/16 0912 12/19/16 0558  NA 132*  < > 132* 133* 133*  --  132* 137  K 3.0*  < > 2.6* 3.1* 3.2*  --  3.9 3.9  CL 95*  < > 98* 98* 100*  --  99* 104  CO2 27  < > 27 26 26   --  24 25  GLUCOSE 216*  < > 162* 277* 173*  --  207* 160*  BUN 15  < > 13 15 14   --  12 9  CREATININE 1.14  < > 1.21 1.14 1.13  --  1.17 0.95  CALCIUM 7.8*  < > 7.9* 7.7* 7.9*  --  7.9* 8.3*  MG 1.5*  --   --  1.9  --  1.8  --   --   < > = values in this interval not displayed.  Recent Results (from the past 240 hour(s))  Culture, expectorated sputum-assessment     Status: None   Collection Time: 12/15/16  7:03 AM  Result Value Ref Range Status   Specimen Description SPUTUM  Final   Special Requests Normal  Final   Sputum evaluation   Final    THIS SPECIMEN IS ACCEPTABLE FOR SPUTUM CULTURE SPECIMEN LOST IN  TRANSIT. NO CULTURE RESULT AVAILABLE Performed at [facility]    Report Status 12/20/2016 FINAL  Final  Culture, blood (routine x 2) Call MD if unable to obtain prior to antibiotics being given     Status: None   Collection Time: 12/15/16  6:57 PM  Result Value Ref Range Status  Specimen Description BLOOD BLOOD RIGHT FOREARM  Final   Special Requests IN PEDIATRIC BOTTLE [place]  Final   Culture   Final    NO GROWTH 5 DAYS Performed at [facility]    Report Status 12/21/2016 FINAL  Final  Culture, blood (routine x 2) Call MD if unable to obtain prior to antibiotics being given     Status: Abnormal   Collection Time: 12/15/16  7:14 PM  Result Value Ref Range Status   Specimen Description BLOOD RIGHT HAND  Final   Special Requests IN PEDIATRIC BOTTLE 1.5CC  Final   Culture  Setup Time   Final    PED GRAM POSITIVE COCCI IN CLUSTERS Organism ID to follow CRITICAL RESULT CALLED TO, READ BACK BY AND VERIFIED WITH: T. GREEN, PHARM AT 0754 ON [id number] BY Rhea Bleacher Performed at [place] (A)  Final   Report Status 12/19/2016 FINAL  Final   Organism ID, Bacteria METHICILLIN RESISTANT STAPHYLOCOCCUS AUREUS  Final      Susceptibility   Methicillin resistant staphylococcus aureus - MIC*    CIPROFLOXACIN >=8 RESISTANT Resistant     ERYTHROMYCIN >=8 RESISTANT Resistant     GENTAMICIN <=0.5 SENSITIVE Sensitive     OXACILLIN >=4 RESISTANT Resistant     TETRACYCLINE <=1 SENSITIVE Sensitive     VANCOMYCIN <=0.5 SENSITIVE Sensitive     TRIMETH/SULFA <=10 SENSITIVE Sensitive     CLINDAMYCIN <=0.25 SENSITIVE Sensitive     RIFAMPIN <=0.5 SENSITIVE Sensitive     Inducible Clindamycin NEGATIVE Sensitive     * METHICILLIN RESISTANT STAPHYLOCOCCUS AUREUS  Blood Culture ID Panel (Reflexed)     Status: Abnormal   Collection Time: 12/15/16  7:14 PM  Result Value Ref Range Status   Enterococcus species NOT DETECTED NOT  DETECTED Final   Listeria monocytogenes NOT DETECTED NOT DETECTED Final   Staphylococcus species DETECTED (A) NOT DETECTED Final    Comment: CRITICAL RESULT CALLED TO, READ BACK BY AND VERIFIED WITH: T. GREEN, PHARM AT 0754 ON [id number] BY S. YARBROUGH    Staphylococcus aureus DETECTED (A) NOT DETECTED Final    Comment: CRITICAL RESULT CALLED TO, READ BACK BY AND VERIFIED WITH: T. GREEN, PHARM AT 0754 ON [id number] BY S. YARBROUGH    Methicillin resistance DETECTED (A) NOT DETECTED Final    Comment: CRITICAL RESULT CALLED TO, READ BACK BY AND VERIFIED WITH: T. GREEN, PHARM AT 0754 ON [id number] BY S. YARBROUGH    Streptococcus species NOT DETECTED NOT DETECTED Final   Streptococcus agalactiae NOT DETECTED NOT DETECTED Final   Streptococcus pneumoniae NOT DETECTED NOT DETECTED Final   Streptococcus pyogenes NOT DETECTED NOT DETECTED Final   Acinetobacter baumannii NOT DETECTED NOT DETECTED Final   Enterobacteriaceae species NOT DETECTED NOT DETECTED Final   Enterobacter cloacae complex NOT DETECTED NOT DETECTED Final   Escherichia coli NOT DETECTED NOT DETECTED Final   Klebsiella oxytoca NOT DETECTED NOT DETECTED Final   Klebsiella pneumoniae NOT DETECTED NOT DETECTED Final   Proteus species NOT DETECTED NOT DETECTED Final   Serratia marcescens NOT DETECTED NOT DETECTED Final   Haemophilus influenzae NOT DETECTED NOT DETECTED Final   Neisseria meningitidis NOT DETECTED NOT DETECTED Final   Pseudomonas aeruginosa NOT DETECTED NOT DETECTED Final   Candida albicans NOT DETECTED NOT DETECTED Final   Candida glabrata NOT DETECTED NOT DETECTED Final   Candida krusei NOT DETECTED NOT DETECTED Final   Candida parapsilosis NOT DETECTED NOT DETECTED Final  Candida tropicalis NOT DETECTED NOT DETECTED Final    Comment: Performed at [facility]  Culture, blood (Routine X 2) w Reflex to ID Panel     Status: None (Preliminary result)   Collection Time: 12/18/16  9:12 AM  Result Value Ref Range  Status   Specimen Description BLOOD LEFT HAND  Final   Special Requests IN PEDIATRIC BOTTLE 3CC  Final   Culture   Final    NO GROWTH 4 DAYS Performed at [facility]    Report Status PENDING  Incomplete     Liver Function Tests:  Recent Labs Lab 12/18/16 0917 12/19/16 0558  AST 19 15  ALT 10* 10*  ALKPHOS 75 72  BILITOT 0.9 0.9  PROT 5.6* 5.6*  ALBUMIN 2.2* 2.4*    Recent Labs Lab 12/18/16 0917  LIPASE 10*   No results for input(s): AMMONIA in the last 168 hours.  Cardiac Enzymes: No results for input(s): CKTOTAL, CKMB, CKMBINDEX, TROPONINI in the last 168 hours. BNP (last 3 results)  Recent Labs  12/15/16 1857  BNP 201.8*    ProBNP (last 3 results) No results for input(s): PROBNP in the last 8760 hours.    Studies: No results found.  Scheduled Meds: . brimonidine  1 drop Both Eyes BID   And  . timolol  1 drop Both Eyes BID  . calcium carbonate  400 mg of elemental calcium Oral Daily  . dorzolamide  1 drop Both Eyes BID  . enoxaparin (LOVENOX) injection  40 mg Subcutaneous Daily  . feeding supplement (GLUCERNA SHAKE)  237 mL Oral TID BM  . guaiFENesin  600 mg Oral BID  . insulin aspart  0-15 Units Subcutaneous TID WC  . insulin aspart  0-5 Units Subcutaneous QHS  . insulin glargine  30 Units Subcutaneous Daily  . irbesartan  300 mg Oral Daily  . latanoprost  1 drop Both Eyes QHS  . potassium chloride  40 mEq Oral BID  . pregabalin  100 mg Oral BID  . vancomycin  750 mg Intravenous Q12H      Time spent: 25 min  [facility] Pager [phone]. If 7PM-7AM, please contact night-coverage at [email or web address], Office  [phone]  password [place] 12/22/2016, 4:45 PM  LOS: 9 days

## 2016-12-23 DIAGNOSIS — Z452 Encounter for adjustment and management of vascular access device: Secondary | ICD-10-CM | POA: Diagnosis not present

## 2016-12-23 DIAGNOSIS — E1010 Type 1 diabetes mellitus with ketoacidosis without coma: Secondary | ICD-10-CM | POA: Diagnosis not present

## 2016-12-23 DIAGNOSIS — R531 Weakness: Secondary | ICD-10-CM | POA: Diagnosis not present

## 2016-12-23 DIAGNOSIS — E1021 Type 1 diabetes mellitus with diabetic nephropathy: Secondary | ICD-10-CM | POA: Diagnosis not present

## 2016-12-23 DIAGNOSIS — R2681 Unsteadiness on feet: Secondary | ICD-10-CM | POA: Diagnosis not present

## 2016-12-23 DIAGNOSIS — E1310 Other specified diabetes mellitus with ketoacidosis without coma: Secondary | ICD-10-CM | POA: Diagnosis not present

## 2016-12-23 DIAGNOSIS — I10 Essential (primary) hypertension: Secondary | ICD-10-CM | POA: Diagnosis not present

## 2016-12-23 DIAGNOSIS — Z794 Long term (current) use of insulin: Secondary | ICD-10-CM | POA: Diagnosis not present

## 2016-12-23 DIAGNOSIS — R278 Other lack of coordination: Secondary | ICD-10-CM | POA: Diagnosis not present

## 2016-12-23 DIAGNOSIS — E871 Hypo-osmolality and hyponatremia: Secondary | ICD-10-CM | POA: Diagnosis not present

## 2016-12-23 DIAGNOSIS — R7881 Bacteremia: Secondary | ICD-10-CM | POA: Diagnosis not present

## 2016-12-23 DIAGNOSIS — Z79899 Other long term (current) drug therapy: Secondary | ICD-10-CM | POA: Diagnosis not present

## 2016-12-23 DIAGNOSIS — E119 Type 2 diabetes mellitus without complications: Secondary | ICD-10-CM | POA: Diagnosis not present

## 2016-12-23 DIAGNOSIS — J1100 Influenza due to unidentified influenza virus with unspecified type of pneumonia: Secondary | ICD-10-CM | POA: Diagnosis not present

## 2016-12-23 DIAGNOSIS — H4010X0 Unspecified open-angle glaucoma, stage unspecified: Secondary | ICD-10-CM | POA: Diagnosis not present

## 2016-12-23 DIAGNOSIS — D508 Other iron deficiency anemias: Secondary | ICD-10-CM | POA: Diagnosis not present

## 2016-12-23 DIAGNOSIS — M6281 Muscle weakness (generalized): Secondary | ICD-10-CM | POA: Diagnosis not present

## 2016-12-23 DIAGNOSIS — R5381 Other malaise: Secondary | ICD-10-CM | POA: Diagnosis not present

## 2016-12-23 DIAGNOSIS — E1042 Type 1 diabetes mellitus with diabetic polyneuropathy: Secondary | ICD-10-CM | POA: Diagnosis not present

## 2016-12-23 DIAGNOSIS — R21 Rash and other nonspecific skin eruption: Secondary | ICD-10-CM | POA: Diagnosis not present

## 2016-12-23 DIAGNOSIS — M1711 Unilateral primary osteoarthritis, right knee: Secondary | ICD-10-CM | POA: Diagnosis not present

## 2016-12-23 LAB — CBC
HEMATOCRIT: 29.1 % — AB (ref 39.0–52.0)
HEMOGLOBIN: 9.9 g/dL — AB (ref 13.0–17.0)
MCH: 30.5 pg (ref 26.0–34.0)
MCHC: 34 g/dL (ref 30.0–36.0)
MCV: 89.5 fL (ref 78.0–100.0)
Platelets: 462 10*3/uL — ABNORMAL HIGH (ref 150–400)
RBC: 3.25 MIL/uL — ABNORMAL LOW (ref 4.22–5.81)
RDW: 14.5 % (ref 11.5–15.5)
WBC: 9 10*3/uL (ref 4.0–10.5)

## 2016-12-23 LAB — BASIC METABOLIC PANEL
Anion gap: 7 (ref 5–15)
BUN: 16 mg/dL (ref 6–20)
CHLORIDE: 98 mmol/L — AB (ref 101–111)
CO2: 25 mmol/L (ref 22–32)
CREATININE: 1.2 mg/dL (ref 0.61–1.24)
Calcium: 7.9 mg/dL — ABNORMAL LOW (ref 8.9–10.3)
GFR calc Af Amer: >60 mL/min (ref >60)
GFR calc non Af Amer: 56 mL/min — ABNORMAL LOW (ref >60)
Glucose, Bld: 367 mg/dL — ABNORMAL HIGH (ref 65–99)
Potassium: 4.9 mmol/L (ref 3.5–5.1)
SODIUM: 130 mmol/L — AB (ref 135–145)

## 2016-12-23 LAB — GLUCOSE, CAPILLARY
GLUCOSE-CAPILLARY: 245 mg/dL — AB (ref 65–99)
Glucose-Capillary: 337 mg/dL — ABNORMAL HIGH (ref 65–99)
Glucose-Capillary: 376 mg/dL — ABNORMAL HIGH (ref 65–99)

## 2016-12-23 LAB — CULTURE, BLOOD (ROUTINE X 2): CULTURE: NO GROWTH

## 2016-12-23 LAB — VANCOMYCIN, TROUGH: Vancomycin Tr: 18 ug/mL (ref 15–20)

## 2016-12-23 MED ORDER — POTASSIUM CHLORIDE CRYS ER 20 MEQ PO TBCR
40.0000 meq | EXTENDED_RELEASE_TABLET | Freq: Every day | ORAL | 2 refills | Status: AC
Start: 2016-12-23 — End: ?

## 2016-12-23 MED ORDER — POLYETHYLENE GLYCOL 3350 17 G PO PACK
17.0000 g | PACK | Freq: Every day | ORAL | Status: DC
  Administered 2016-12-23: 17 g via ORAL
  Filled 2016-12-23: qty 1

## 2016-12-23 MED ORDER — VANCOMYCIN HCL IN DEXTROSE 750-5 MG/150ML-% IV SOLN: 750.0000 mg | Freq: Two times a day (BID) | INTRAVENOUS | Status: AC

## 2016-12-23 MED ORDER — POLYETHYLENE GLYCOL 3350 17 G PO PACK: 17.0000 g | PACK | Freq: Every day | ORAL | 0 refills | Status: DC

## 2016-12-23 MED ORDER — INSULIN ASPART 100 UNIT/ML ~~LOC~~ SOLN: 0.0000 [IU] | Freq: Three times a day (TID) | SUBCUTANEOUS | 11 refills | Status: DC

## 2016-12-23 MED ORDER — INSULIN GLARGINE 100 UNIT/ML ~~LOC~~ SOLN: 30.0000 [IU] | Freq: Every day | SUBCUTANEOUS | 11 refills | Status: DC

## 2016-12-23 MED ORDER — TRAMADOL HCL 50 MG PO TABS: 50.0000 mg | ORAL_TABLET | Freq: Four times a day (QID) | ORAL | 0 refills | Status: DC | PRN

## 2016-12-23 MED ORDER — TRAZODONE HCL 50 MG PO TABS: 50.0000 mg | ORAL_TABLET | Freq: Every evening | ORAL | 0 refills | Status: DC | PRN

## 2016-12-23 MED ORDER — HEPARIN SOD (PORK) LOCK FLUSH 100 UNIT/ML IV SOLN
250.0000 [IU] | INTRAVENOUS | Status: AC | PRN
  Administered 2016-12-23: 250 [IU]

## 2016-12-23 NOTE — Clinical Social Work Placement (Signed)
   CLINICAL SOCIAL WORK PLACEMENT  NOTE  Date:  12/23/2016  Patient Details  Name: Dylan Quinn MRN: [id number] Date of Birth: Mar 16, 1936  Clinical Social Work is seeking post-discharge placement for this patient at the [place] level of care (*CSW will initial, date and re-position this form in  chart as items are completed):      Patient/family provided with [place] Work Department's list of facilities offering this level of care within the geographic area requested by the patient (or if unable, by the patient's family).  Yes   Patient/family informed of their freedom to choose among providers that offer the needed level of care, that participate in Medicare, Medicaid or managed care program needed by the patient, have an available bed and are willing to accept the patient.  Yes   Patient/family informed of [place]'s ownership interest in [facility] and [facility], as well as of the fact that they are under no obligation to receive care at these facilities.  PASRR submitted to EDS on 12/21/16     PASRR number received on 12/21/16     Existing PASRR number confirmed on       FL2 transmitted to all facilities in geographic area requested by pt/family on 12/21/16     FL2 transmitted to all facilities within larger geographic area on       Patient informed that his/her managed care company has contracts with or will negotiate with certain facilities, including the following:   [facility] Medicare Complete)     Yes   Patient/family informed of bed offers received.  Patient chooses bed at [facility]     Physician recommends and patient chooses bed at      Patient to be transferred to [facility] on  .  Patient to be transferred to facility by [facility])     Patient family notified on 12/23/16 of transfer.  Name of family member notified:  Quincy Simmonds     PHYSICIAN Please sign FL2, Please prepare priority  discharge summary, including medications, Please prepare prescriptions     Additional Comment:  RN calling for transport.  _______________________________________________ Rigoberto Noel, LCSW 12/23/2016, 1:34 PM

## 2016-12-23 NOTE — Progress Notes (Signed)
Pharmacy Antibiotic Note  Dylan Quinn is a 80 y.o. male admitted on 12/11/2016 influenza pneumonia, subsequently found to have MRSA bacteremia.  Patient has finished a 5-day course of Tamiflu.  Empiric antibiotic regimen was changed to vancomycin monotherapy.    Day #8/14 of vancomycin, current dosage 750 mg IV q12h  Vancomycin trough 18 (therapeutic) Serum creatinine 1.20 - remains WNL and near baseline values  Recommend: Continue vancomycin 750 mg IV q12h to complete 14 days of therapy total. If vancomycin therapy extends beyond this point, recheck serum creatinine and trough at least weekly.    Height: 6' (182.9 cm) Weight: 215 lb (97.5 kg) IBW/kg (Calculated) : 77.6  Temp (24hrs), Avg:98.9 F (37.2 C), Min:98.5 F (36.9 C), Max:99.2 F (37.3 C)   Recent Labs Lab 12/17/16 0512 12/17/16 1211 12/17/16 1737  12/18/16 0912 12/19/16 0558 12/20/16 2316 12/23/16 0326 12/23/16 1127  WBC 13.6*  --   --   --   --  15.0*  --  9.0  --   CREATININE 1.21 1.14 1.13  --  1.17 0.95  --   --  1.20  VANCOTROUGH  --   --   --   < > 20  --  20  --  18  < > = values in this interval not displayed.  Estimated Creatinine Clearance: 61.4 mL/min (by C-G formula based on SCr of 1.2 mg/dL).    Allergies  Allergen Reactions  . Lisinopril Hives    hives    Antimicrobials this admission: 12/23 vanc >> 12/23 cefepime >>12/25   Microbiology results: 12/19 MRSA PCR screen: negative 12/23 BCx:   MRSA in 1 of 2 sets 12/23 Sputum: collected but lost in transit 12/26 BCx: ngtd    Thank you for allowing Pharmacy to participate in this patient's care.  Clayburn Pert, PharmD, BCPS Pager: [phone] 12/23/2016  12:28 PM

## 2016-12-23 NOTE — Progress Notes (Signed)
Pt PICC ready.  Pt dressed.  Report called to [facility] and spoke w/ Andee Poles, Therapist, sports.  Pt belongings gathered and transported w/ pt via [facility] to Rehab facility.  Kizzie Ide, RN

## 2016-12-23 NOTE — Discharge Summary (Signed)
Physician Discharge Summary  Dylan Quinn [id number] DOB: [birth year] DOA: 12/11/2016  PCP: Tracie Harrier, MD  Admit date: 12/11/2016 Discharge date: 12/23/2016  Time spent: 25* minutes  Recommendations for Outpatient Follow-up:  1. Continue vancomycin 750 mg IV q 12 hr for 6 more days,  stop on 12/30/16 2. Check BMP in one week on 12/30/16 to check potassium   Discharge Diagnoses:  Principal Problem:   Diabetic ketoacidosis without coma associated with type 1 diabetes mellitus ([place]) Active Problems:   Essential hypertension   URI (upper respiratory infection)   Nausea & vomiting   DKA, type 1 ([place])   Influenza with pneumonia   Staphylococcus aureus bacteremia   Discharge Condition: Stable  Diet recommendation: carb modified diet  Filed Weights   12/11/16 0159  Weight: 97.5 kg (215 lb)    History of present illness:  80 y/o with history of HTN and DM who presented to the hospital with + influenza A and DKA. Patient DKA resolved and influenza was treated with 5 days of tamiflu. On 12/22 patient had 1 episode of possible hemoptysis and then another episode on 12/23. Sputum culture was sent. Patient underwent chest xray and showed possible pneumonia or hemorrhage. Patient was then started on antibiotics for pneumonia. Now found to have MRSA bacteremia. Patient went to [facility] for TEE, which was negative for mural thrombus or valvular vegetation.Repeat blood cultures drawn on 12/18/2016 are negative to date.   Hospital Course:  1. Bacteremia with MRSA- patient underwent TEE  which was negative for valvular vegetation. Patient currently on vancomycin, infectious disease following. Repeat blood cultures sent on 12/18/2016 are negative to date. Patient will need 14 days of vancomycin, called and discussed with Dr. Johnnye Sima. Patient only had one set of blood cultures done on 12/18/2016, which is negative. Called and discussed Dr. Johnnye Sima who says it's okay to put a PICC line with   negative blood cultures 1. Picc line placed, will need 6 more days of vancomycin STOP date 1/71/18 2. Diabetic ketoacidosis without coma-  Resolved, patient has type 1 diabetes mellitus, currently on Lantus 32 units every morning, hemoglobin A1c 10.4. Continue sliding scale insulin with NovoLog.Lantus 30 units subcut daily. 3. Healthcare associated pneumonia, influenza pneumonia- continue vancomycin, respiratory status is stable. Patient not requiring oxygen. 4. Hemoptysis- resolved, likely from above. 5. Hypertension-blood pressure stable, continue HCTZ, benicar 6. Hypokalemia- patient had persistent hypokalemia in the hospital, requiring Potassium supplementation Kdur 40 meq po bid. Likely from HCTZ, will discharge on Kdur 40 meq po daily.Will need repeat BMP on 12/30/16 7. Influenza-completed course of Tamiflu 8. Constipation- started on Miralax 17 gm daily  Procedures:  TEE  Consultations:  Infectious disease  Discharge Exam: Vitals:   12/22/16 2100 12/23/16 0400  BP: (!) 151/75 (!) 154/81  Pulse: 87 92  Resp: 19 19  Temp: 99.2 F (37.3 C) 99.1 F (37.3 C)    General: Appears in no acute distress Cardiovascular: RRR, S1S2 Respiratory: Clear bilaterally  Discharge Instructions   Discharge Instructions    Diet - low sodium heart healthy    Complete by:  As directed    Increase activity slowly    Complete by:  As directed      Current Discharge Medication List    START taking these medications   Details  insulin aspart (NOVOLOG) 100 UNIT/ML injection Inject 0-15 Units into the skin 3 (three) times daily with meals. Sliding scale insulin Less than 70 initiate hypoglycemia protocol 70-120  0 units 121-150 2 unit  151-200 3 units 201-250 5 units 251-300 8 units 301-350 11 units 351-400 15 units  Greater than 400 call MD Qty: 10 mL, Refills: 11    insulin glargine (LANTUS) 100 UNIT/ML injection Inject 0.3 mLs (30 Units total) into the skin daily. Qty: 10 mL,  Refills: 11    polyethylene glycol (MIRALAX / GLYCOLAX) packet Take 17 g by mouth daily. Qty: 14 each, Refills: 0    potassium chloride SA (K-DUR,KLOR-CON) 20 MEQ tablet Take 2 tablets (40 mEq total) by mouth daily. Qty: 30 tablet, Refills: 2    traZODone (DESYREL) 50 MG tablet Take 1 tablet (50 mg total) by mouth at bedtime as needed for sleep. Qty: 30 tablet, Refills: 0    Vancomycin (VANCOCIN) 750-5 MG/150ML-% SOLN Inject 150 mLs (750 mg total) into the vein every 12 (twelve) hours. Qty: 4000 mL      CONTINUE these medications which have CHANGED   Details  traMADol (ULTRAM) 50 MG tablet Take 1 tablet (50 mg total) by mouth every 6 (six) hours as needed for moderate pain or severe pain. Qty: 30 tablet, Refills: 0      CONTINUE these medications which have NOT CHANGED   Details  aspirin (GOODSENSE ASPIRIN) 81 MG chewable tablet Chew 81 mg by mouth daily.     brimonidine-timolol (COMBIGAN) 0.2-0.5 % ophthalmic solution Place 1 drop into both eyes 2 (two) times daily.     clobetasol cream (TEMOVATE) AB-123456789 % Apply 1 application topically 2 (two) times daily as needed (for rash).     dorzolamide (TRUSOPT) 2 % ophthalmic solution Place 1 drop into both eyes 2 (two) times daily.    glucagon (GLUCAGON EMERGENCY) 1 MG injection Inject 1 mg into the muscle once as needed. Qty: 2 each, Refills: 1    hydrochlorothiazide (HYDRODIURIL) 25 MG tablet Take 1 tablet (25 mg total) by mouth daily. Qty: 90 tablet, Refills: 1   Associated Diagnoses: Essential hypertension    latanoprost (XALATAN) 0.005 % ophthalmic solution Place 1 drop into both eyes at bedtime.    olmesartan (BENICAR) 40 MG tablet Take 1 tablet (40 mg total) by mouth daily. 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 Qty: 180 capsule,  Refills: 1   Associated Diagnoses: Type 1 diabetes mellitus with diabetic neuropathy ([facility])      STOP taking these medications     Insulin Glargine (LANTUS) 100 UNIT/ML Solostar Pen      insulin lispro (HUMALOG) 100 UNIT/ML injection      HYDROcodone-homatropine (HYCODAN) 5-1.5 MG/5ML syrup      insulin glargine (LANTUS) 100 unit/mL SOPN        Allergies  Allergen Reactions  . Lisinopril Hives    hives   Contact information for after-discharge care    Destination    [facility] .   Specialty:  [place] information: [address] [place] [phone]               The results of significant diagnostics from this hospitalization (including imaging, microbiology, ancillary and laboratory) are listed below for reference.    Significant Diagnostic Studies: Dg Chest 1 View  Result Date: 12/15/2016 CLINICAL DATA:  Influenza a, DKA, cough, hemoptysis, history prostate cancer, hypertension, type I diabetes mellitus EXAM: CHEST 1 VIEW COMPARISON:  Portable  exam 1656 hours compared to 12/11/2016 FINDINGS: Normal heart size, mediastinal contours, and pulmonary vascularity. Minimal patchy infiltrate in LEFT mid lung. More significant infiltrate identified at RIGHT lung base, likely RIGHT middle lobe. Remaining lungs clear. No pleural effusion or pneumothorax. Bones demineralized. IMPRESSION: BILATERAL pulmonary infiltrates greatest at RIGHT middle lobe, question pneumonia though pulmonary hemorrhage could cause a similar appearance. Electronically Signed   By: Lavonia Dana M.D.   On: 12/15/2016 18:12   Dg Chest 2 View  Result Date: 12/09/2016 CLINICAL DATA:  Cough, body aches, malaise X 3 days. Non smoker. diabetic EXAM: CHEST  2 VIEW COMPARISON:  12/21/2015 FINDINGS: Heart size is normal. Aorta is mildly tortuous. There is stable elevation of left hemidiaphragm. Mildly prominent interstitial markings are stable.  There are no focal consolidations or pleural effusions. No pulmonary edema. IMPRESSION: No evidence for acute cardiopulmonary abnormality. Electronically Signed   By: Nolon Nations M.D.   On: 12/09/2016 17:44   Dg Chest Port 1 View  Result Date: 12/18/2016 CLINICAL DATA:  Shortness of Breath EXAM: PORTABLE CHEST 1 VIEW COMPARISON:  12/15/2016 FINDINGS: Cardiomediastinal silhouette is stable. Worsening reticular interstitial prominence bilaterally. There is persistent patchy airspace disease right lower lobe and left midlung. Worsening pneumonia or pulmonary edema cannot be excluded. IMPRESSION: Worsening reticular interstitial prominence bilaterally. There is persistent patchy airspace disease right lower lobe and left midlung. Worsening pneumonia or pulmonary edema cannot be excluded. Electronically Signed   By: Lahoma Crocker M.D.   On: 12/18/2016 13:37   Dg Chest Port 1 View  Result Date: 12/11/2016 CLINICAL DATA:  Cough and body aches worsening this morning. EXAM: PORTABLE CHEST 1 VIEW COMPARISON:  PA and lateral chest x-ray of December 09, 2016 FINDINGS: The left hemidiaphragm remains elevated. The lungs are adequately inflated and clear. The heart and pulmonary vascularity are normal. The mediastinum is normal in width. There is calcification in the wall of the aortic arch. The bony thorax exhibits no acute abnormality. IMPRESSION: No pneumonia nor CHF. Persistent elevation of the left hemidiaphragm. Thoracic aortic atherosclerosis. Electronically Signed   By: David  Martinique M.D.   On: 12/11/2016 08:07    Microbiology: Recent Results (from the past 240 hour(s))  Culture, expectorated sputum-assessment     Status: None   Collection Time: 12/15/16  7:03 AM  Result Value Ref Range Status   Specimen Description SPUTUM  Final   Special Requests Normal  Final   Sputum evaluation   Final    THIS SPECIMEN IS ACCEPTABLE FOR SPUTUM CULTURE SPECIMEN LOST IN TRANSIT. NO CULTURE RESULT  AVAILABLE Performed at [facility]    Report Status 12/20/2016 FINAL  Final  Culture, blood (routine x 2) Call MD if unable to obtain prior to antibiotics being given     Status: None   Collection Time: 12/15/16  6:57 PM  Result Value Ref Range Status   Specimen Description BLOOD BLOOD RIGHT FOREARM  Final   Special Requests IN PEDIATRIC BOTTLE [facility]  Final   Culture   Final    NO GROWTH 5 DAYS Performed at [facility]    Report Status 12/21/2016 FINAL  Final  Culture, blood (routine x 2) Call MD if unable to obtain prior to antibiotics being given     Status: Abnormal   Collection Time: 12/15/16  7:14 PM  Result Value Ref Range Status   Specimen Description BLOOD RIGHT HAND  Final   Special Requests IN PEDIATRIC BOTTLE 1.5CC  Final   Culture  Setup  Time   Final    PED GRAM POSITIVE COCCI IN CLUSTERS Organism ID to follow CRITICAL RESULT CALLED TO, READ BACK BY AND VERIFIED WITH: T. GREEN, PHARM AT 0754 ON [id number] BY Rhea Bleacher Performed at [place] (A)  Final   Report Status 12/19/2016 FINAL  Final   Organism ID, Bacteria METHICILLIN RESISTANT STAPHYLOCOCCUS AUREUS  Final      Susceptibility   Methicillin resistant staphylococcus aureus - MIC*    CIPROFLOXACIN >=8 RESISTANT Resistant     ERYTHROMYCIN >=8 RESISTANT Resistant     GENTAMICIN <=0.5 SENSITIVE Sensitive     OXACILLIN >=4 RESISTANT Resistant     TETRACYCLINE <=1 SENSITIVE Sensitive     VANCOMYCIN <=0.5 SENSITIVE Sensitive     TRIMETH/SULFA <=10 SENSITIVE Sensitive     CLINDAMYCIN <=0.25 SENSITIVE Sensitive     RIFAMPIN <=0.5 SENSITIVE Sensitive     Inducible Clindamycin NEGATIVE Sensitive     * METHICILLIN RESISTANT STAPHYLOCOCCUS AUREUS  Blood Culture ID Panel (Reflexed)     Status: Abnormal   Collection Time: 12/15/16  7:14 PM  Result Value Ref Range Status   Enterococcus species NOT DETECTED NOT DETECTED Final   Listeria  monocytogenes NOT DETECTED NOT DETECTED Final   Staphylococcus species DETECTED (A) NOT DETECTED Final    Comment: CRITICAL RESULT CALLED TO, READ BACK BY AND VERIFIED WITH: T. GREEN, PHARM AT 0754 ON [id number] BY S. YARBROUGH    Staphylococcus aureus DETECTED (A) NOT DETECTED Final    Comment: CRITICAL RESULT CALLED TO, READ BACK BY AND VERIFIED WITH: T. GREEN, PHARM AT 0754 ON [id number] BY S. YARBROUGH    Methicillin resistance DETECTED (A) NOT DETECTED Final    Comment: CRITICAL RESULT CALLED TO, READ BACK BY AND VERIFIED WITH: T. GREEN, PHARM AT 0754 ON [id number] BY S. YARBROUGH    Streptococcus species NOT DETECTED NOT DETECTED Final   Streptococcus agalactiae NOT DETECTED NOT DETECTED Final   Streptococcus pneumoniae NOT DETECTED NOT DETECTED Final   Streptococcus pyogenes NOT DETECTED NOT DETECTED Final   Acinetobacter baumannii NOT DETECTED NOT DETECTED Final   Enterobacteriaceae species NOT DETECTED NOT DETECTED Final   Enterobacter cloacae complex NOT DETECTED NOT DETECTED Final   Escherichia coli NOT DETECTED NOT DETECTED Final   Klebsiella oxytoca NOT DETECTED NOT DETECTED Final   Klebsiella pneumoniae NOT DETECTED NOT DETECTED Final   Proteus species NOT DETECTED NOT DETECTED Final   Serratia marcescens NOT DETECTED NOT DETECTED Final   Haemophilus influenzae NOT DETECTED NOT DETECTED Final   Neisseria meningitidis NOT DETECTED NOT DETECTED Final   Pseudomonas aeruginosa NOT DETECTED NOT DETECTED Final   Candida albicans NOT DETECTED NOT DETECTED Final   Candida glabrata NOT DETECTED NOT DETECTED Final   Candida krusei NOT DETECTED NOT DETECTED Final   Candida parapsilosis NOT DETECTED NOT DETECTED Final   Candida tropicalis NOT DETECTED NOT DETECTED Final    Comment: Performed at [facility]  Culture, blood (Routine X 2) w Reflex to ID Panel     Status: None (Preliminary result)   Collection Time: 12/18/16  9:12 AM  Result Value Ref Range Status   Specimen  Description BLOOD LEFT HAND  Final   Special Requests IN PEDIATRIC BOTTLE 3CC  Final   Culture   Final    NO GROWTH 4 DAYS Performed at [facility]    Report Status PENDING  Incomplete     Labs: Basic Metabolic Panel:  Recent  Labs Lab 12/17/16 1211 12/17/16 1737 12/18/16 0525 12/18/16 0912 12/19/16 0558 12/23/16 1127  NA 133* 133*  --  132* 137 130*  K 3.1* 3.2*  --  3.9 3.9 4.9  CL 98* 100*  --  99* 104 98*  CO2 26 26  --  24 25 25   GLUCOSE 277* 173*  --  207* 160* 367*  BUN 15 14  --  12 9 16   CREATININE 1.14 1.13  --  1.17 0.95 1.20  CALCIUM 7.7* 7.9*  --  7.9* 8.3* 7.9*  MG 1.9  --  1.8  --   --   --    Liver Function Tests:  Recent Labs Lab 12/18/16 0917 12/19/16 0558  AST 19 15  ALT 10* 10*  ALKPHOS 75 72  BILITOT 0.9 0.9  PROT 5.6* 5.6*  ALBUMIN 2.2* 2.4*    Recent Labs Lab 12/18/16 0917  LIPASE 10*   No results for input(s): AMMONIA in the last 168 hours. CBC:  Recent Labs Lab 12/17/16 0512 12/19/16 0558 12/23/16 0326  WBC 13.6* 15.0* 9.0  NEUTROABS 11.0*  --   --   HGB 11.3* 10.9* 9.9*  HCT 31.6* 31.3* 29.1*  MCV 86.8 87.9 89.5  PLT 241 329 462*   BNP: BNP (last 3 results)  Recent Labs  12/15/16 1857  BNP 201.8*     CBG:  Recent Labs Lab 12/22/16 1212 12/22/16 1737 12/23/16 0045 12/23/16 0806 12/23/16 1152  GLUCAP 191* 179* 337* 245* 376*     Signed:  Eleonore Chiquito S MD.  [facility] 12/23/2016, 12:45 PM

## 2016-12-27 ENCOUNTER — Non-Acute Institutional Stay (SKILLED_NURSING_FACILITY): Payer: Medicare Other | Admitting: Internal Medicine

## 2016-12-27 ENCOUNTER — Encounter: Payer: Self-pay | Admitting: Internal Medicine

## 2016-12-27 DIAGNOSIS — E1042 Type 1 diabetes mellitus with diabetic polyneuropathy: Secondary | ICD-10-CM | POA: Diagnosis not present

## 2016-12-27 DIAGNOSIS — E871 Hypo-osmolality and hyponatremia: Secondary | ICD-10-CM | POA: Diagnosis not present

## 2016-12-27 DIAGNOSIS — K5900 Constipation, unspecified: Secondary | ICD-10-CM

## 2016-12-27 DIAGNOSIS — R5381 Other malaise: Secondary | ICD-10-CM

## 2016-12-27 DIAGNOSIS — R7881 Bacteremia: Secondary | ICD-10-CM

## 2016-12-27 DIAGNOSIS — E876 Hypokalemia: Secondary | ICD-10-CM

## 2016-12-27 DIAGNOSIS — I10 Essential (primary) hypertension: Secondary | ICD-10-CM | POA: Diagnosis not present

## 2016-12-27 DIAGNOSIS — B9562 Methicillin resistant Staphylococcus aureus infection as the cause of diseases classified elsewhere: Secondary | ICD-10-CM

## 2016-12-27 DIAGNOSIS — J1100 Influenza due to unidentified influenza virus with unspecified type of pneumonia: Secondary | ICD-10-CM | POA: Diagnosis not present

## 2016-12-27 NOTE — Progress Notes (Signed)
LOCATION: [facility]  PCP: Tracie Harrier, MD   Code Status: Full Code  Goals of care: Advanced Directive information Advanced Directives 12/19/2016  Does Patient Have a Medical Advance Directive? No  Would patient like information on creating a medical advance directive? No - Patient declined  Pre-existing out of facility DNR order (yellow form or pink MOST form) -       Extended Emergency Contact Information Primary Emergency Contact: Stiverson,Silva Address: [address]          [place], [place] Phone: [phone] Mobile Phone: [phone] Relation: Spouse Secondary Emergency Contact: Maryelizabeth Rowan [place] of [employer] Phone: [phone] Relation: Daughter   Allergies  Allergen Reactions  . Lisinopril Hives    hives    Chief Complaint  Patient presents with  . New Admit To SNF    New Admission Visit      HPI:  Patient is a 81 y.o. male seen today for short term rehabilitation post hospital admission from 12/11/2016-12/23/2016. He was treated in the hospital for influenza a with Tamiflu for 5 days. He also had diabetic ketoacidosis and MRSA bacteremia. TEE was negative for mural thrombus or valvular vegetation. Infectious disease was consulted and he was placed on IV vancomycin.He has medical history of hypertension and diabetes. He is seen in his room today.  Review of Systems:  Constitutional: Negative for fever, chills, diaphoresis. Energy level is slowly coming back.   HENT: Negative for headache, congestion, nasal discharge Eyes: Negative for blurred vision, double vision and discharge.  Respiratory: Negative for shortness of breath and wheezing. Positive for cough with occasional phlegm.    Cardiovascular: Negative for chest pain, palpitations, leg swelling.  Gastrointestinal: Negative for heartburn, nausea, vomiting, abdominal pain. Last bowel movement was yesterday.   Genitourinary: Negative  for dysuria Musculoskeletal: Negative for back pain, fall in the facility.  Skin: Negative for itching, rash.  Neurological: Negative for dizziness.    Past Medical History:  Diagnosis Date  . BENIGN PROSTATIC HYPERTROPHY 07/19/2009  . COLONIC POLYPS 02/14/2010  . DEPRESSION 03/16/2008  . DIABETES MELLITUS, TYPE I 06/30/2007  . Dyslipidemia   . FOLLICULITIS 0000000  . GLAUCOMA 07/19/2009  . HEARING LOSS 02/14/2010  . HYPERTENSION 05/14/2008  . HYPOGONADISM, MALE 12/16/2007  . Leukopenia   . LUMBAR RADICULOPATHY, LEFT 02/23/2010  . Other osteoporosis 12/16/2007  . PERIPHERAL NEUROPATHY 06/30/2007  . Prostate cancer ([place])   . PROTEINURIA, MILD 02/14/2010  . Psoriasis   . URINARY CALCULUS 12/27/2010   Past Surgical History:  Procedure Laterality Date  . APPENDECTOMY  1986  . CATARACT EXTRACTION  2006  . CATARACT EXTRACTION  1997  . TEE WITHOUT CARDIOVERSION N/A 12/19/2016   Procedure: TRANSESOPHAGEAL ECHOCARDIOGRAM (TEE);  Surgeon: Pixie Casino, MD;  Location: [facility] ENDOSCOPY;  Service: Cardiovascular;  Laterality: N/A;   Social History:   reports that he has never smoked. He has never used smokeless tobacco. He reports that he does not drink alcohol or use drugs.  Family History  Problem Relation Age of Onset  . Cancer Father     Colon Cancer  . Heart disease Father   . High blood pressure      Medications: Allergies as of 12/27/2016      Reactions   Lisinopril Hives   hives      Medication List       Accurate as of 12/27/16 12:20 PM. Always use your most recent med list.  clobetasol cream 0.05 % Commonly known as:  TEMOVATE Apply 1 application topically 2 (two) times daily as needed (for rash).   COMBIGAN 0.2-0.5 % ophthalmic solution Generic drug:  brimonidine-timolol Place 1 drop into both eyes 2 (two) times daily.   dorzolamide 2 % ophthalmic solution Commonly known as:  TRUSOPT Place 1 drop into both eyes 2 (two) times daily.   glucagon 1 MG  injection Commonly known as:  GLUCAGON EMERGENCY Inject 1 mg into the muscle once as needed.   GOODSENSE ASPIRIN 81 MG chewable tablet Generic drug:  aspirin Chew 81 mg by mouth daily.   hydrochlorothiazide 25 MG tablet Commonly known as:  HYDRODIURIL Take 1 tablet (25 mg total) by mouth daily.   insulin glargine 100 UNIT/ML injection Commonly known as:  LANTUS Inject 0.3 mLs (30 Units total) into the skin daily.   insulin lispro 100 UNIT/ML injection Commonly known as:  HUMALOG Inject 0-10 Units into the skin 4 (four) times daily -  before meals and at bedtime.   latanoprost 0.005 % ophthalmic solution Commonly known as:  XALATAN Place 1 drop into both eyes at bedtime.   olmesartan 40 MG tablet Commonly known as:  BENICAR Take 1 tablet (40 mg total) by mouth daily.   polyethylene glycol packet Commonly known as:  MIRALAX / GLYCOLAX Take 17 g by mouth daily.   potassium chloride SA 20 MEQ tablet Commonly known as:  K-DUR,KLOR-CON Take 2 tablets (40 mEq total) by mouth daily.   pregabalin 100 MG capsule Commonly known as:  LYRICA Take 1 capsule (100 mg total) by mouth 2 (two) times daily.   traMADol 50 MG tablet Commonly known as:  ULTRAM Take 1 tablet (50 mg total) by mouth every 6 (six) hours as needed for moderate pain or severe pain.   traZODone 50 MG tablet Commonly known as:  DESYREL Take 1 tablet (50 mg total) by mouth at bedtime as needed for sleep.   Vancomycin 750-5 MG/150ML-% Soln Commonly known as:  VANCOCIN Inject 150 mLs (750 mg total) into the vein every 12 (twelve) hours.       Immunizations: Immunization History  Administered Date(s) Administered  . Influenza Whole 10/24/2008, 10/21/2009, 10/05/2010  . Influenza, High Dose Seasonal PF 10/09/2016  . Influenza,inj,Quad PF,36+ Mos 09/28/2014  . Influenza-Unspecified 10/25/2015  . Pneumococcal Polysaccharide-23 11/23/2008, 06/03/2013  . Tdap 06/03/2013     Physical Exam: Vitals:    12/27/16 1213  BP: (!) 154/89  Pulse: 96  Resp: 20  Temp: 98.8 F (37.1 C)  TempSrc: Oral  SpO2: 99%  Weight: 215 lb (97.5 kg)  Height: 6' (1.829 m)   Body mass index is 29.16 kg/m.  General- elderly male, well built, in no acute distress Head- normocephalic, atraumatic Nose- no nasal discharge Throat- moist mucus membrane Eyes- PERRLA, EOMI, no pallor, no icterus, no discharge, normal conjunctiva, normal sclera Neck- no cervical lymphadenopathy Cardiovascular- normal s1,s2, no murmur Respiratory- bilateral clear to auscultation, no wheeze, no rhonchi, no crackles, no use of accessory muscles Abdomen- bowel sounds present, soft, non tender Musculoskeletal- able to move all 4 extremities, no leg edema Neurological- alert and oriented to person, place and time Skin- warm and dry, bruise to his arms, PICC line to right upper arm with dressing clean and dry Psychiatry- normal mood and affect    Labs reviewed: Basic Metabolic Panel:  Recent Labs  12/16/16 1102  12/17/16 1211  12/18/16 0525 12/18/16 0912 12/19/16 0558 12/23/16 1127  NA 132*  < >  133*  < >  --  132* 137 130*  K 3.0*  < > 3.1*  < >  --  3.9 3.9 4.9  CL 95*  < > 98*  < >  --  99* 104 98*  CO2 27  < > 26  < >  --  24 25 25   GLUCOSE 216*  < > 277*  < >  --  207* 160* 367*  BUN 15  < > 15  < >  --  12 9 16   CREATININE 1.14  < > 1.14  < >  --  1.17 0.95 1.20  CALCIUM 7.8*  < > 7.7*  < >  --  7.9* 8.3* 7.9*  MG 1.5*  --  1.9  --  1.8  --   --   --   < > = values in this interval not displayed. Liver Function Tests:  Recent Labs  12/11/16 0235 12/18/16 0917 12/19/16 0558  AST 21 19 15   ALT 14* 10* 10*  ALKPHOS 130* 75 72  BILITOT 0.8 0.9 0.9  PROT 7.3 5.6* 5.6*  ALBUMIN 3.9 2.2* 2.4*    Recent Labs  12/09/16 1603 12/11/16 0235 12/18/16 0917  LIPASE <10* 18 10*   No results for input(s): AMMONIA in the last 8760 hours. CBC:  Recent Labs  12/29/15 1118  12/17/16 0512 12/19/16 0558  12/23/16 0326  WBC 7.3  < > 13.6* 15.0* 9.0  NEUTROABS 5.0  --  11.0*  --   --   HGB 12.2*  < > 11.3* 10.9* 9.9*  HCT 36.9*  < > 31.6* 31.3* 29.1*  MCV 90.1  < > 86.8 87.9 89.5  PLT 345.0  < > 241 329 462*  < > = values in this interval not displayed. Cardiac Enzymes:  Recent Labs  12/09/16 1603 12/11/16 0600 12/11/16 0802  TROPONINI <0.03 QUESTIONABLE RESULTS, RECOMMEND RECOLLECT TO VERIFY 0.04*   BNP: Invalid input(s): POCBNP CBG:  Recent Labs  12/23/16 0045 12/23/16 0806 12/23/16 1152  GLUCAP 337* 245* 376*    Radiological Exams: Dg Chest 1 View  Result Date: 12/15/2016 CLINICAL DATA:  Influenza a, DKA, cough, hemoptysis, history prostate cancer, hypertension, type I diabetes mellitus EXAM: CHEST 1 VIEW COMPARISON:  Portable exam 1656 hours compared to 12/11/2016 FINDINGS: Normal heart size, mediastinal contours, and pulmonary vascularity. Minimal patchy infiltrate in LEFT mid lung. More significant infiltrate identified at RIGHT lung base, likely RIGHT middle lobe. Remaining lungs clear. No pleural effusion or pneumothorax. Bones demineralized. IMPRESSION: BILATERAL pulmonary infiltrates greatest at RIGHT middle lobe, question pneumonia though pulmonary hemorrhage could cause a similar appearance. Electronically Signed   By: Lavonia Dana M.D.   On: 12/15/2016 18:12   Dg Chest 2 View  Result Date: 12/09/2016 CLINICAL DATA:  Cough, body aches, malaise X 3 days. Non smoker. diabetic EXAM: CHEST  2 VIEW COMPARISON:  12/21/2015 FINDINGS: Heart size is normal. Aorta is mildly tortuous. There is stable elevation of left hemidiaphragm. Mildly prominent interstitial markings are stable. There are no focal consolidations or pleural effusions. No pulmonary edema. IMPRESSION: No evidence for acute cardiopulmonary abnormality. Electronically Signed   By: Nolon Nations M.D.   On: 12/09/2016 17:44   Dg Chest Port 1 View  Result Date: 12/18/2016 CLINICAL DATA:  Shortness of Breath  EXAM: PORTABLE CHEST 1 VIEW COMPARISON:  12/15/2016 FINDINGS: Cardiomediastinal silhouette is stable. Worsening reticular interstitial prominence bilaterally. There is persistent patchy airspace disease right lower lobe and left midlung. Worsening pneumonia or  pulmonary edema cannot be excluded. IMPRESSION: Worsening reticular interstitial prominence bilaterally. There is persistent patchy airspace disease right lower lobe and left midlung. Worsening pneumonia or pulmonary edema cannot be excluded. Electronically Signed   By: Lahoma Crocker M.D.   On: 12/18/2016 13:37   Dg Chest Port 1 View  Result Date: 12/11/2016 CLINICAL DATA:  Cough and body aches worsening this morning. EXAM: PORTABLE CHEST 1 VIEW COMPARISON:  PA and lateral chest x-ray of December 09, 2016 FINDINGS: The left hemidiaphragm remains elevated. The lungs are adequately inflated and clear. The heart and pulmonary vascularity are normal. The mediastinum is normal in width. There is calcification in the wall of the aortic arch. The bony thorax exhibits no acute abnormality. IMPRESSION: No pneumonia nor CHF. Persistent elevation of the left hemidiaphragm. Thoracic aortic atherosclerosis. Electronically Signed   By: David  Martinique M.D.   On: 12/11/2016 08:07    Assessment/Plan  Physical deconditioning With generalized weakness from recent influenza, MRSA bacteria anemia and diabetic ketoacidosis. Will need to work with physical therapy and occupational therapy to help regain his strength and restore his balance.  MRSA bacteremia Patient remains afebrile this visit. Vital signs are stable. Continue vancomycin 750 mg IV every 12 hours until 12/30/2016. Continue PICC line care.  Influenza Has completed 5 days of Tamiflu. Will have him to use incentive spirometer to prevent atelectasis.  Insulin-dependent diabetes mellitus with neuropathy With recent episode of diabetic ketoacidosis requiring hospitalization. Reviewed hemoglobin A1c of 10.4.  Continue Lantus 30 units daily for sliding scale insulin NovoLog for now and monitor blood sugar reading.Continue Lyrica 100 mg twice a day and monitor.  Hyponatremia Monitor BMP  Constipation Continue MiraLAX and monitor.   Hypokalemia  continue potassium chloride supplement and check BMP.  Hypertension Monitor blood pressure readings every shift for now. Continue hydrochlorothiazide 25 mg daily with olmesartan 40 mg daily and baby aspirin.   Goals of care: short term rehabilitation   Labs/tests ordered: CBC, CMP 12/28/2016 and 12/31/2016  Family/ staff Communication: reviewed care plan with patient and nursing supervisor    Blanchie Serve, MD Internal Medicine [place], [place] [address] Cell Phone (Monday-Friday 8 am - 5 pm): [phone] On Call: [phone] and follow prompts after 5 pm and on weekends Office Phone: [phone] Office Fax: [phone]

## 2016-12-31 ENCOUNTER — Non-Acute Institutional Stay (SKILLED_NURSING_FACILITY): Payer: Medicare Other | Admitting: Family

## 2016-12-31 DIAGNOSIS — R21 Rash and other nonspecific skin eruption: Secondary | ICD-10-CM | POA: Diagnosis not present

## 2016-12-31 DIAGNOSIS — Z452 Encounter for adjustment and management of vascular access device: Secondary | ICD-10-CM | POA: Diagnosis not present

## 2016-12-31 NOTE — Progress Notes (Signed)
Location:  [facility] Room Number: [place] of Service:  SNF (31) Provider:  Dinah Ngetich FNP-C   Tracie Harrier, MD  Patient Care Team: Tracie Harrier, MD as PCP - General (Internal Medicine) Monna Fam, MD as Attending Physician (Ophthalmology)  Extended Emergency Contact Information Primary Emergency Contact: Dooner,Silva Address: [address]          [place], [place] Phone: [phone] Mobile Phone: [phone] Relation: Spouse Secondary Emergency Contact: Pigford,Nicole  [place] Mobile Phone: [phone] Relation: Daughter  Code Status:  Full Code  Goals of care: Advanced Directive information Advanced Directives 12/19/2016  Does Patient Have a Medical Advance Directive? No  Would patient like information on creating a medical advance directive? No - Patient declined  Pre-existing out of facility DNR order (yellow form or pink MOST form) -     Chief Complaint  Patient presents with  . Acute Visit    Rash     HPI:  Pt is a 81 y.o. male seen today at [facility] for an acute visit for evaluation of rash. He is seen in his room today. He complains of itchy rash on the abdomen and arms X 4 days. He has taken Benadryl 12.5 mg Tablet every 6 hours as needed with some relief.Of note he completed I.V. Vancomycin 12/30/2016 for MRSA bacteremia.He denies any fever, chills or cough. Facility Nurse reports no other new concerns.   Past Medical History:  Diagnosis Date  . BENIGN PROSTATIC HYPERTROPHY 07/19/2009  . COLONIC POLYPS 02/14/2010  . DEPRESSION 03/16/2008  . DIABETES MELLITUS, TYPE I 06/30/2007  . Dyslipidemia   . FOLLICULITIS 0000000  . GLAUCOMA 07/19/2009  . HEARING LOSS 02/14/2010  . HYPERTENSION 05/14/2008  . HYPOGONADISM, MALE 12/16/2007  . Leukopenia   . LUMBAR RADICULOPATHY, LEFT 02/23/2010  . Other osteoporosis 12/16/2007  . PERIPHERAL  NEUROPATHY 06/30/2007  . Prostate cancer ([name])   . PROTEINURIA, MILD 02/14/2010  . Psoriasis   . URINARY CALCULUS 12/27/2010   Past Surgical History:  Procedure Laterality Date  . APPENDECTOMY  1986  . CATARACT EXTRACTION  2006  . CATARACT EXTRACTION  1997  . TEE WITHOUT CARDIOVERSION N/A 12/19/2016   Procedure: TRANSESOPHAGEAL ECHOCARDIOGRAM (TEE);  Surgeon: Pixie Casino, MD;  Location: [facility] ENDOSCOPY;  Service: Cardiovascular;  Laterality: N/A;    Allergies  Allergen Reactions  . Lisinopril Hives    hives    Allergies as of 12/31/2016      Reactions   Lisinopril Hives   hives      Medication List       Accurate as of 12/31/16  1:53 PM. Always use your most recent med list.          clobetasol cream 0.05 % Commonly known as:  TEMOVATE Apply 1 application topically 2 (two) times daily as needed (for rash).   COMBIGAN 0.2-0.5 % ophthalmic solution Generic drug:  brimonidine-timolol Place 1 drop into both eyes 2 (two) times daily.   dorzolamide 2 % ophthalmic solution Commonly known as:  TRUSOPT Place 1 drop into both eyes 2 (two) times daily.   glucagon 1 MG injection Commonly known as:  GLUCAGON EMERGENCY Inject 1 mg into the muscle once as needed.   GOODSENSE ASPIRIN 81 MG chewable tablet Generic drug:  aspirin Chew 81 mg by mouth daily.   hydrochlorothiazide 25 MG tablet Commonly known as:  HYDRODIURIL Take 1 tablet (25 mg total)  by mouth daily.   insulin glargine 100 UNIT/ML injection Commonly known as:  LANTUS Inject 0.3 mLs (30 Units total) into the skin daily.   insulin lispro 100 UNIT/ML injection Commonly known as:  HUMALOG Inject 0-10 Units into the skin 4 (four) times daily -  before meals and at bedtime.   latanoprost 0.005 % ophthalmic solution Commonly known as:  XALATAN Place 1 drop into both eyes at bedtime.   olmesartan 40 MG tablet Commonly known as:  BENICAR Take 1 tablet (40 mg total) by mouth daily.   polyethylene glycol  packet Commonly known as:  MIRALAX / GLYCOLAX Take 17 g by mouth daily.   potassium chloride SA 20 MEQ tablet Commonly known as:  K-DUR,KLOR-CON Take 2 tablets (40 mEq total) by mouth daily.   pregabalin 100 MG capsule Commonly known as:  LYRICA Take 1 capsule (100 mg total) by mouth 2 (two) times daily.   traMADol 50 MG tablet Commonly known as:  ULTRAM Take 1 tablet (50 mg total) by mouth every 6 (six) hours as needed for moderate pain or severe pain.   traZODone 50 MG tablet Commonly known as:  DESYREL Take 1 tablet (50 mg total) by mouth at bedtime as needed for sleep.       Review of Systems  Constitutional: Negative for activity change, appetite change, chills, fatigue and fever.  HENT: Negative for congestion, rhinorrhea, sinus pressure, sneezing and sore throat.   Eyes: Negative.   Respiratory: Negative for cough, chest tightness, shortness of breath and wheezing.   Cardiovascular: Negative for chest pain, palpitations and leg swelling.  Gastrointestinal: Negative for abdominal distention, abdominal pain, constipation, diarrhea, nausea and vomiting.  Endocrine: Negative for cold intolerance, heat intolerance, polydipsia, polyphagia and polyuria.  Genitourinary: Negative for dysuria, flank pain, frequency and urgency.  Skin: Positive for rash. Negative for color change and pallor.  Neurological: Negative for dizziness, seizures, syncope, light-headedness and headaches.  Psychiatric/Behavioral: Negative for agitation, confusion, hallucinations and sleep disturbance. The patient is not nervous/anxious.     Immunization History  Administered Date(s) Administered  . Influenza Whole 10/24/2008, 10/21/2009, 10/05/2010  . Influenza, High Dose Seasonal PF 10/09/2016  . Influenza,inj,Quad PF,36+ Mos 09/28/2014  . Influenza-Unspecified 10/25/2015  . Pneumococcal Polysaccharide-23 11/23/2008, 06/03/2013  . Tdap 06/03/2013   Pertinent  Health Maintenance Due  Topic Date Due   . FOOT EXAM  06/03/2014  . PNA vac Low Risk Adult (2 of 2 - PCV13) 06/03/2014  . OPHTHALMOLOGY EXAM  01/05/2017  . HEMOGLOBIN A1C  04/09/2017  . INFLUENZA VACCINE  Completed   Fall Risk  12/09/2015 06/17/2013 06/04/2013  Falls in the past year? No No No    Vitals:   12/31/16 1200  BP: (!) 143/82  Pulse: 88  Resp: 20  Temp: 98.2 F (36.8 C)  SpO2: 96%  Weight: 205 lb (93 kg)  Height: 6' (1.829 m)   Body mass index is 27.8 kg/m. Physical Exam  Constitutional: He is oriented to person, place, and time. He appears well-developed and well-nourished. No distress.  HENT:  Head: Normocephalic.  Mouth/Throat: Oropharynx is clear and moist. No oropharyngeal exudate.  Eyes: Conjunctivae and EOM are normal. Pupils are equal, round, and reactive to light. Right eye exhibits no discharge. Left eye exhibits no discharge.  Neck: Normal range of motion. No JVD present. No thyromegaly present.  Cardiovascular: Normal rate, regular rhythm, normal heart sounds and intact distal pulses.  Exam reveals no gallop and no friction rub.   No murmur  heard. Pulmonary/Chest: Effort normal and breath sounds normal. No respiratory distress. He has no wheezes. He has no rales.  Abdominal: Soft. Bowel sounds are normal. He exhibits no distension. There is no tenderness. There is no rebound and no guarding.  Musculoskeletal: Normal range of motion. He exhibits no edema, tenderness or deformity.  Lymphadenopathy:    He has no cervical adenopathy.  Neurological: He is oriented to person, place, and time.  Skin: Skin is warm and dry. No erythema. No pallor.  Red rash to abdomen and both arms noted with scratch marks.   Psychiatric: He has a normal mood and affect.    Labs reviewed:  Recent Labs  12/16/16 1102  12/17/16 1211  12/18/16 0525 12/18/16 0912 12/19/16 0558 12/23/16 1127  NA 132*  < > 133*  < >  --  132* 137 130*  K 3.0*  < > 3.1*  < >  --  3.9 3.9 4.9  CL 95*  < > 98*  < >  --  99* 104 98*   CO2 27  < > 26  < >  --  24 25 25   GLUCOSE 216*  < > 277*  < >  --  207* 160* 367*  BUN 15  < > 15  < >  --  12 9 16   CREATININE 1.14  < > 1.14  < >  --  1.17 0.95 1.20  CALCIUM 7.8*  < > 7.7*  < >  --  7.9* 8.3* 7.9*  MG 1.5*  --  1.9  --  1.8  --   --   --   < > = values in this interval not displayed.  Recent Labs  12/11/16 0235 12/18/16 0917 12/19/16 0558  AST 21 19 15   ALT 14* 10* 10*  ALKPHOS 130* 75 72  BILITOT 0.8 0.9 0.9  PROT 7.3 5.6* 5.6*  ALBUMIN 3.9 2.2* 2.4*    Recent Labs  12/17/16 0512 12/19/16 0558 12/23/16 0326  WBC 13.6* 15.0* 9.0  NEUTROABS 11.0*  --   --   HGB 11.3* 10.9* 9.9*  HCT 31.6* 31.3* 29.1*  MCV 86.8 87.9 89.5  PLT 241 329 462*   Lab Results  Component Value Date   TSH 0.53 12/12/2015   Lab Results  Component Value Date   HGBA1C 10.4 10/09/2016   Lab Results  Component Value Date   CHOL 122 12/12/2015   HDL 44.50 12/12/2015   LDLCALC 59 12/12/2015   TRIG 91.0 12/12/2015   CHOLHDL 3 12/12/2015   Assessment/Plan  Rash and nonspecific skin eruption Afebrile. Possible associated with recent I.v. Vancomycin for MRSA bacteremia. Benadryl 12.5 mg Tablet with some relief. Will increase Benadryl to 25 mg Tablet every 8 Hrs X 48 hours then change to as needed. Continue to monitor.   Attention to PICC  Completed  I.v. Vancomycin for MRSA bacteremia 12/30/2016. Will have PICC line removed today by facility Nurse.     Family/ staff Communication: Reviewed plan of care with patient and facility Nurse Supervisor.   Labs/tests ordered: None

## 2017-01-10 ENCOUNTER — Ambulatory Visit: Payer: Medicare Other | Admitting: Internal Medicine

## 2017-01-11 ENCOUNTER — Non-Acute Institutional Stay (SKILLED_NURSING_FACILITY): Payer: Medicare Other | Admitting: Family

## 2017-01-11 DIAGNOSIS — E1021 Type 1 diabetes mellitus with diabetic nephropathy: Secondary | ICD-10-CM

## 2017-01-11 DIAGNOSIS — I10 Essential (primary) hypertension: Secondary | ICD-10-CM

## 2017-01-11 DIAGNOSIS — M1711 Unilateral primary osteoarthritis, right knee: Secondary | ICD-10-CM | POA: Diagnosis not present

## 2017-01-11 DIAGNOSIS — R2681 Unsteadiness on feet: Secondary | ICD-10-CM | POA: Diagnosis not present

## 2017-01-11 DIAGNOSIS — H4010X0 Unspecified open-angle glaucoma, stage unspecified: Secondary | ICD-10-CM | POA: Diagnosis not present

## 2017-01-11 DIAGNOSIS — J1100 Influenza due to unidentified influenza virus with unspecified type of pneumonia: Secondary | ICD-10-CM

## 2017-01-11 NOTE — Progress Notes (Signed)
Location:  [place] Room Number: [place] of Service:  SNF (31)  Provider: Marlowe Sax FNP-C   PCP: Tracie Harrier, MD Patient Care Team: Tracie Harrier, MD as PCP - General (Internal Medicine) Monna Fam, MD as Attending Physician (Ophthalmology)  Extended Emergency Contact Information Primary Emergency Contact: Mcmartin,Silva Address: [address]          [place], [place] Phone: [phone] Mobile Phone: [phone] Relation: Spouse Secondary Emergency Contact: Lajeunesse,Nicole  [place] Mobile Phone: [phone] Relation: Daughter  Code Status: Full Code  Goals of care:  Advanced Directive information Advanced Directives 12/19/2016  Does Patient Have a Medical Advance Directive? No  Would patient like information on creating a medical advance directive? No - Patient declined  Pre-existing out of facility DNR order (yellow form or pink MOST form) -     Allergies  Allergen Reactions  . Lisinopril Hives    hives    Chief Complaint  Patient presents with  . Discharge Note    HPI:  81 y.o. male seen today at  [facility] for discharge home. He was here for short term rehabilitation post hospital admission from 12/11/2016-12/23/2016. He was treated in the hospital for influenza a with Tamiflu for 5 days. He also had diabetic ketoacidosis and MRSA bacteremia. TEE was negative for mural thrombus or valvular vegetation. Infectious disease was consulted and he was placed on IV vancomycin. He completed IV antibiotics. He has a medical history of HTN, Neuropathy, Type 1 DM, COPD,BPH, prostate Cancer, depression, OA among other conditions. He is seen in his room today. He denies any acute issues this visit.He was recently treated for red rash on trunk area and arms with much improvement.Unclear etiology but suspected possible delayed reaction to antibiotics.He  has worked well with PT/OT now stable for discharge home.He will be discharged home with Home health PT/OT to continue with ROM, Exercise, Gait stability and muscle strengthening. He does not require any DME he states has own FWW at home. Home health services will be arranged by facility social worker prior to discharge. Prescription medication will be written x 1 month then patient to follow up with PCP in 1-2 weeks.Facility staff report no new concerns.      Past Medical History:  Diagnosis Date  . BENIGN PROSTATIC HYPERTROPHY 07/19/2009  . COLONIC POLYPS 02/14/2010  . DEPRESSION 03/16/2008  . DIABETES MELLITUS, TYPE I 06/30/2007  . Dyslipidemia   . FOLLICULITIS 0000000  . GLAUCOMA 07/19/2009  . HEARING LOSS 02/14/2010  . HYPERTENSION 05/14/2008  . HYPOGONADISM, MALE 12/16/2007  . Leukopenia   . LUMBAR RADICULOPATHY, LEFT 02/23/2010  . Other osteoporosis 12/16/2007  . PERIPHERAL NEUROPATHY 06/30/2007  . Prostate cancer ([place])   . PROTEINURIA, MILD 02/14/2010  . Psoriasis   . URINARY CALCULUS 12/27/2010    Past Surgical History:  Procedure Laterality Date  . APPENDECTOMY  1986  . CATARACT EXTRACTION  2006  . CATARACT EXTRACTION  1997  . TEE WITHOUT CARDIOVERSION N/A 12/19/2016   Procedure: TRANSESOPHAGEAL ECHOCARDIOGRAM (TEE);  Surgeon: Pixie Casino, MD;  Location: [facility] ENDOSCOPY;  Service: Cardiovascular;  Laterality: N/A;      reports that he has never smoked. He has never used smokeless tobacco. He reports that he does not drink alcohol or use drugs. Social History   Social History  . Marital status: Married    Spouse name: N/A  . Number of children: 2  .  Years of education: N/A   Occupational History  . Retired Retired   Social History Main Topics  . Smoking status: Never Smoker  . Smokeless tobacco: Never Used  . Alcohol use No     Comment: rare  . Drug use: No  . Sexual activity: No   Other Topics Concern  . 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 Live at home with wife.  Drives and completes ADLs without assistance.  Does not cane or walker.  No home services.  Exercises with personal trainer at the [facility].      Allergies  Allergen Reactions  . Lisinopril Hives    hives    Pertinent  Health Maintenance Due  Topic Date Due  . FOOT EXAM  06/03/2014  . PNA vac Low Risk Adult (2 of 2 - PCV13) 06/03/2014  . OPHTHALMOLOGY EXAM  01/05/2017  . HEMOGLOBIN A1C  04/09/2017  . INFLUENZA VACCINE  Completed    Medications: Allergies as of 01/11/2017      Reactions   Lisinopril Hives   hives      Medication List       Accurate as of 01/11/17  3:59 PM. Always use your most recent med list.          clobetasol cream 0.05 % Commonly known as:  TEMOVATE Apply 1 application topically 2 (two) times daily as needed (for rash).   COMBIGAN 0.2-0.5 % ophthalmic solution Generic drug:  brimonidine-timolol Place 1 drop into both eyes 2 (two) times daily.   diphenhydrAMINE 25 MG tablet Commonly known as:  BENADRYL Take 25 mg by mouth every 6 (six) hours as needed.   dorzolamide 2 % ophthalmic solution Commonly known as:  TRUSOPT Place 1 drop into both eyes 2 (two) times daily.   glucagon 1 MG injection Commonly known as:  GLUCAGON EMERGENCY Inject 1 mg into the muscle once as needed.   GOODSENSE ASPIRIN 81 MG chewable tablet Generic drug:  aspirin Chew 81 mg by mouth daily.   hydrochlorothiazide 25 MG tablet Commonly known as:  HYDRODIURIL Take 1 tablet (25 mg total) by mouth daily.   insulin glargine 100 UNIT/ML injection Commonly known as:  LANTUS Inject 0.3 mLs (30 Units total) into the skin daily.   insulin lispro 100 UNIT/ML injection Commonly known as:  HUMALOG Inject 0-10 Units into the skin 4 (four) times daily -  before meals and at bedtime.   latanoprost 0.005 % ophthalmic solution Commonly known as:  XALATAN Place 1 drop into both eyes at bedtime.   olmesartan 40 MG tablet Commonly known  as:  BENICAR Take 1 tablet (40 mg total) by mouth daily.   polyethylene glycol packet Commonly known as:  MIRALAX / GLYCOLAX Take 17 g by mouth daily.   potassium chloride SA 20 MEQ tablet Commonly known as:  K-DUR,KLOR-CON Take 2 tablets (40 mEq total) by mouth daily.   pregabalin 100 MG capsule Commonly known as:  LYRICA Take 1 capsule (100 mg total) by mouth 2 (two) times daily.   traMADol 50 MG tablet Commonly known as:  ULTRAM Take 1 tablet (50 mg total) by mouth every 6 (six) hours as needed for moderate pain or severe pain.       Review of Systems  Constitutional: Negative for activity change, appetite change, chills, fatigue and fever.  HENT: Negative for congestion, rhinorrhea, sinus pressure, sneezing and sore throat.   Eyes: Negative.   Respiratory: Negative  for cough, chest tightness, shortness of breath and wheezing.   Cardiovascular: Negative for chest pain, palpitations and leg swelling.  Gastrointestinal: Negative for abdominal distention, abdominal pain, constipation, diarrhea, nausea and vomiting.  Endocrine: Negative for cold intolerance, heat intolerance, polydipsia, polyphagia and polyuria.  Genitourinary: Negative for dysuria, flank pain, frequency and urgency.  Skin: Negative for color change and pallor.       Rash has improved.   Neurological: Negative for dizziness, seizures, syncope, light-headedness and headaches.  Hematological: Does not bruise/bleed easily.  Psychiatric/Behavioral: Negative for agitation, confusion, hallucinations and sleep disturbance. The patient is not nervous/anxious.     Vitals:   01/11/17 1230  BP: 123/63  Pulse: 80  Resp: 20  Temp: (!) 96.2 F (35.7 C)  SpO2: 99%  Weight: 193 lb 12.8 oz (87.9 kg)  Height: 6' (1.829 m)   Body mass index is 26.28 kg/m. Physical Exam  Constitutional: He is oriented to person, place, and time. He appears well-developed and well-nourished. No distress.  HENT:  Head: Normocephalic.    Mouth/Throat: Oropharynx is clear and moist. No oropharyngeal exudate.  Eyes: Conjunctivae and EOM are normal. Pupils are equal, round, and reactive to light. Right eye exhibits no discharge. Left eye exhibits no discharge.  Neck: Normal range of motion. No JVD present. No thyromegaly present.  Cardiovascular: Normal rate, regular rhythm, normal heart sounds and intact distal pulses.  Exam reveals no gallop and no friction rub.   No murmur heard. Pulmonary/Chest: Effort normal and breath sounds normal. No respiratory distress. He has no wheezes. He has no rales.  Abdominal: Soft. Bowel sounds are normal. He exhibits no distension. There is no tenderness. There is no rebound and no guarding.  Musculoskeletal: Normal range of motion. He exhibits no edema, tenderness or deformity.  Lymphadenopathy:    He has no cervical adenopathy.  Neurological: He is oriented to person, place, and time.  Skin: Skin is warm and dry. No erythema. No pallor.   rash to abdomen resolved with progressive healing on upper back and arms noted.   Psychiatric: He has a normal mood and affect.    Labs reviewed: Basic Metabolic Panel:  Recent Labs  12/16/16 1102  12/17/16 1211  12/18/16 0525 12/18/16 0912 12/19/16 0558 12/23/16 1127  NA 132*  < > 133*  < >  --  132* 137 130*  K 3.0*  < > 3.1*  < >  --  3.9 3.9 4.9  CL 95*  < > 98*  < >  --  99* 104 98*  CO2 27  < > 26  < >  --  24 25 25   GLUCOSE 216*  < > 277*  < >  --  207* 160* 367*  BUN 15  < > 15  < >  --  12 9 16   CREATININE 1.14  < > 1.14  < >  --  1.17 0.95 1.20  CALCIUM 7.8*  < > 7.7*  < >  --  7.9* 8.3* 7.9*  MG 1.5*  --  1.9  --  1.8  --   --   --   < > = values in this interval not displayed. Liver Function Tests:  Recent Labs  12/11/16 0235 12/18/16 0917 12/19/16 0558  AST 21 19 15   ALT 14* 10* 10*  ALKPHOS 130* 75 72  BILITOT 0.8 0.9 0.9  PROT 7.3 5.6* 5.6*  ALBUMIN 3.9 2.2* 2.4*    Recent Labs  12/09/16 1603 12/11/16 0235  12/18/16 0917  LIPASE <10* 18 10*   CBC:  Recent Labs  12/17/16 0512 12/19/16 0558 12/23/16 0326  WBC 13.6* 15.0* 9.0  NEUTROABS 11.0*  --   --   HGB 11.3* 10.9* 9.9*  HCT 31.6* 31.3* 29.1*  MCV 86.8 87.9 89.5  PLT 241 329 462*   Cardiac Enzymes:  Recent Labs  12/09/16 1603 12/11/16 0600 12/11/16 0802  TROPONINI <0.03 QUESTIONABLE RESULTS, RECOMMEND RECOLLECT TO VERIFY 0.04*    Recent Labs  12/23/16 0045 12/23/16 0806 12/23/16 1152  GLUCAP 337* 245* 376*    Assessment/Plan:   1. Essential hypertension B/p stable. Continue on HCTZ and olmesartan. BMP in 1-2 weeks with PCP.   2. 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 Continue on Lantus and Humalog per SSI before meals and at bedtime. Continue on ASA. Hgb A1C, annual eye and foot exam  deferred to PCP.   3. Open-angle glaucoma of both eyes, unspecified glaucoma stage, unspecified open-angle glaucoma type Continue on combigan , trusopt and Xalatan eye drops. Continue to follow up with Opthalmology.   4. Primary osteoarthritis of right knee Continue current regimen. PT/OT for ROM and exercise.   5. Unsteady gait Has worked well with PT/ OT.Will discharge home PT/OT to continue with ROM, Exercise, Gait stability and muscle strengthening. Fall and safety precautions.  6. Influenza   Resolved. Status post hospital admission from 12/11/2016-12/23/2016.Completed Tamiflu for 5 days.   Patient is being discharged with the following home health services:    - PT/OT for ROM, Exercise, gait stability and muscle strenghtening  Patient is being discharged with the following durable medical equipment:    -None required has own FWW at home.   Patient has been advised to f/u with their PCP in 1-2 weeks to for a transitions of care visit.  Social services at their facility was responsible for arranging this appointment.  Pt was provided with adequate prescriptions of noncontrolled medications to reach  the scheduled appointment .  For controlled substances, a limited supply was provided as appropriate for the individual patient.  If the pt normally receives these medications from a pain clinic or has a contract with another physician, these medications should be received from that clinic or physician only).    Future labs/tests needed:  CBC, BMP in 1-2 weeks with PCP

## 2017-01-15 DIAGNOSIS — Z7982 Long term (current) use of aspirin: Secondary | ICD-10-CM | POA: Diagnosis not present

## 2017-01-15 DIAGNOSIS — M1711 Unilateral primary osteoarthritis, right knee: Secondary | ICD-10-CM | POA: Diagnosis not present

## 2017-01-15 DIAGNOSIS — E1042 Type 1 diabetes mellitus with diabetic polyneuropathy: Secondary | ICD-10-CM | POA: Diagnosis not present

## 2017-01-15 DIAGNOSIS — M5416 Radiculopathy, lumbar region: Secondary | ICD-10-CM | POA: Diagnosis not present

## 2017-01-15 DIAGNOSIS — R531 Weakness: Secondary | ICD-10-CM | POA: Diagnosis not present

## 2017-01-15 DIAGNOSIS — E1065 Type 1 diabetes mellitus with hyperglycemia: Secondary | ICD-10-CM | POA: Diagnosis not present

## 2017-01-15 DIAGNOSIS — J449 Chronic obstructive pulmonary disease, unspecified: Secondary | ICD-10-CM | POA: Diagnosis not present

## 2017-01-15 DIAGNOSIS — H4010X4 Unspecified open-angle glaucoma, indeterminate stage: Secondary | ICD-10-CM | POA: Diagnosis not present

## 2017-01-15 DIAGNOSIS — I10 Essential (primary) hypertension: Secondary | ICD-10-CM | POA: Diagnosis not present

## 2017-01-15 DIAGNOSIS — R21 Rash and other nonspecific skin eruption: Secondary | ICD-10-CM | POA: Diagnosis not present

## 2017-01-18 DIAGNOSIS — Z09 Encounter for follow-up examination after completed treatment for conditions other than malignant neoplasm: Secondary | ICD-10-CM | POA: Diagnosis not present

## 2017-01-18 DIAGNOSIS — I10 Essential (primary) hypertension: Secondary | ICD-10-CM | POA: Diagnosis not present

## 2017-01-18 DIAGNOSIS — E1040 Type 1 diabetes mellitus with diabetic neuropathy, unspecified: Secondary | ICD-10-CM | POA: Diagnosis not present

## 2017-01-18 DIAGNOSIS — C61 Malignant neoplasm of prostate: Secondary | ICD-10-CM | POA: Diagnosis not present

## 2017-01-18 DIAGNOSIS — L282 Other prurigo: Secondary | ICD-10-CM | POA: Diagnosis not present

## 2017-01-21 DIAGNOSIS — M1711 Unilateral primary osteoarthritis, right knee: Secondary | ICD-10-CM | POA: Diagnosis not present

## 2017-01-21 DIAGNOSIS — R531 Weakness: Secondary | ICD-10-CM | POA: Diagnosis not present

## 2017-01-21 DIAGNOSIS — E1042 Type 1 diabetes mellitus with diabetic polyneuropathy: Secondary | ICD-10-CM | POA: Diagnosis not present

## 2017-01-21 DIAGNOSIS — H4010X4 Unspecified open-angle glaucoma, indeterminate stage: Secondary | ICD-10-CM | POA: Diagnosis not present

## 2017-01-21 DIAGNOSIS — E1065 Type 1 diabetes mellitus with hyperglycemia: Secondary | ICD-10-CM | POA: Diagnosis not present

## 2017-01-21 DIAGNOSIS — Z7982 Long term (current) use of aspirin: Secondary | ICD-10-CM | POA: Diagnosis not present

## 2017-01-21 DIAGNOSIS — J449 Chronic obstructive pulmonary disease, unspecified: Secondary | ICD-10-CM | POA: Diagnosis not present

## 2017-01-21 DIAGNOSIS — I10 Essential (primary) hypertension: Secondary | ICD-10-CM | POA: Diagnosis not present

## 2017-01-21 DIAGNOSIS — R21 Rash and other nonspecific skin eruption: Secondary | ICD-10-CM | POA: Diagnosis not present

## 2017-01-21 DIAGNOSIS — M5416 Radiculopathy, lumbar region: Secondary | ICD-10-CM | POA: Diagnosis not present

## 2017-01-22 DIAGNOSIS — R21 Rash and other nonspecific skin eruption: Secondary | ICD-10-CM | POA: Diagnosis not present

## 2017-01-22 DIAGNOSIS — R531 Weakness: Secondary | ICD-10-CM | POA: Diagnosis not present

## 2017-01-22 DIAGNOSIS — M1711 Unilateral primary osteoarthritis, right knee: Secondary | ICD-10-CM | POA: Diagnosis not present

## 2017-01-22 DIAGNOSIS — H4010X4 Unspecified open-angle glaucoma, indeterminate stage: Secondary | ICD-10-CM | POA: Diagnosis not present

## 2017-01-22 DIAGNOSIS — J449 Chronic obstructive pulmonary disease, unspecified: Secondary | ICD-10-CM | POA: Diagnosis not present

## 2017-01-22 DIAGNOSIS — E1065 Type 1 diabetes mellitus with hyperglycemia: Secondary | ICD-10-CM | POA: Diagnosis not present

## 2017-01-22 DIAGNOSIS — M5416 Radiculopathy, lumbar region: Secondary | ICD-10-CM | POA: Diagnosis not present

## 2017-01-22 DIAGNOSIS — E1042 Type 1 diabetes mellitus with diabetic polyneuropathy: Secondary | ICD-10-CM | POA: Diagnosis not present

## 2017-01-22 DIAGNOSIS — Z7982 Long term (current) use of aspirin: Secondary | ICD-10-CM | POA: Diagnosis not present

## 2017-01-22 DIAGNOSIS — I10 Essential (primary) hypertension: Secondary | ICD-10-CM | POA: Diagnosis not present

## 2017-01-25 DIAGNOSIS — I10 Essential (primary) hypertension: Secondary | ICD-10-CM | POA: Diagnosis not present

## 2017-01-25 DIAGNOSIS — M5416 Radiculopathy, lumbar region: Secondary | ICD-10-CM | POA: Diagnosis not present

## 2017-01-25 DIAGNOSIS — H4010X4 Unspecified open-angle glaucoma, indeterminate stage: Secondary | ICD-10-CM | POA: Diagnosis not present

## 2017-01-25 DIAGNOSIS — E1065 Type 1 diabetes mellitus with hyperglycemia: Secondary | ICD-10-CM | POA: Diagnosis not present

## 2017-01-25 DIAGNOSIS — R531 Weakness: Secondary | ICD-10-CM | POA: Diagnosis not present

## 2017-01-25 DIAGNOSIS — M1711 Unilateral primary osteoarthritis, right knee: Secondary | ICD-10-CM | POA: Diagnosis not present

## 2017-01-25 DIAGNOSIS — J449 Chronic obstructive pulmonary disease, unspecified: Secondary | ICD-10-CM | POA: Diagnosis not present

## 2017-01-25 DIAGNOSIS — E1042 Type 1 diabetes mellitus with diabetic polyneuropathy: Secondary | ICD-10-CM | POA: Diagnosis not present

## 2017-01-25 DIAGNOSIS — Z7982 Long term (current) use of aspirin: Secondary | ICD-10-CM | POA: Diagnosis not present

## 2017-01-25 DIAGNOSIS — R21 Rash and other nonspecific skin eruption: Secondary | ICD-10-CM | POA: Diagnosis not present

## 2017-01-29 DIAGNOSIS — E1042 Type 1 diabetes mellitus with diabetic polyneuropathy: Secondary | ICD-10-CM | POA: Diagnosis not present

## 2017-01-29 DIAGNOSIS — Z7982 Long term (current) use of aspirin: Secondary | ICD-10-CM | POA: Diagnosis not present

## 2017-01-29 DIAGNOSIS — R21 Rash and other nonspecific skin eruption: Secondary | ICD-10-CM | POA: Diagnosis not present

## 2017-01-29 DIAGNOSIS — R531 Weakness: Secondary | ICD-10-CM | POA: Diagnosis not present

## 2017-01-29 DIAGNOSIS — H4010X4 Unspecified open-angle glaucoma, indeterminate stage: Secondary | ICD-10-CM | POA: Diagnosis not present

## 2017-01-29 DIAGNOSIS — I10 Essential (primary) hypertension: Secondary | ICD-10-CM | POA: Diagnosis not present

## 2017-01-29 DIAGNOSIS — E1065 Type 1 diabetes mellitus with hyperglycemia: Secondary | ICD-10-CM | POA: Diagnosis not present

## 2017-01-29 DIAGNOSIS — M1711 Unilateral primary osteoarthritis, right knee: Secondary | ICD-10-CM | POA: Diagnosis not present

## 2017-01-29 DIAGNOSIS — J449 Chronic obstructive pulmonary disease, unspecified: Secondary | ICD-10-CM | POA: Diagnosis not present

## 2017-01-29 DIAGNOSIS — M5416 Radiculopathy, lumbar region: Secondary | ICD-10-CM | POA: Diagnosis not present

## 2017-01-30 DIAGNOSIS — I10 Essential (primary) hypertension: Secondary | ICD-10-CM | POA: Diagnosis not present

## 2017-01-30 DIAGNOSIS — H4010X4 Unspecified open-angle glaucoma, indeterminate stage: Secondary | ICD-10-CM | POA: Diagnosis not present

## 2017-01-30 DIAGNOSIS — J449 Chronic obstructive pulmonary disease, unspecified: Secondary | ICD-10-CM | POA: Diagnosis not present

## 2017-01-30 DIAGNOSIS — E1042 Type 1 diabetes mellitus with diabetic polyneuropathy: Secondary | ICD-10-CM | POA: Diagnosis not present

## 2017-01-30 DIAGNOSIS — R531 Weakness: Secondary | ICD-10-CM | POA: Diagnosis not present

## 2017-01-30 DIAGNOSIS — M1711 Unilateral primary osteoarthritis, right knee: Secondary | ICD-10-CM | POA: Diagnosis not present

## 2017-01-30 DIAGNOSIS — Z7982 Long term (current) use of aspirin: Secondary | ICD-10-CM | POA: Diagnosis not present

## 2017-01-30 DIAGNOSIS — R21 Rash and other nonspecific skin eruption: Secondary | ICD-10-CM | POA: Diagnosis not present

## 2017-01-30 DIAGNOSIS — E1065 Type 1 diabetes mellitus with hyperglycemia: Secondary | ICD-10-CM | POA: Diagnosis not present

## 2017-01-30 DIAGNOSIS — M5416 Radiculopathy, lumbar region: Secondary | ICD-10-CM | POA: Diagnosis not present

## 2017-02-01 DIAGNOSIS — E1042 Type 1 diabetes mellitus with diabetic polyneuropathy: Secondary | ICD-10-CM | POA: Diagnosis not present

## 2017-02-01 DIAGNOSIS — M5416 Radiculopathy, lumbar region: Secondary | ICD-10-CM | POA: Diagnosis not present

## 2017-02-01 DIAGNOSIS — R21 Rash and other nonspecific skin eruption: Secondary | ICD-10-CM | POA: Diagnosis not present

## 2017-02-01 DIAGNOSIS — R531 Weakness: Secondary | ICD-10-CM | POA: Diagnosis not present

## 2017-02-01 DIAGNOSIS — Z7982 Long term (current) use of aspirin: Secondary | ICD-10-CM | POA: Diagnosis not present

## 2017-02-01 DIAGNOSIS — E1065 Type 1 diabetes mellitus with hyperglycemia: Secondary | ICD-10-CM | POA: Diagnosis not present

## 2017-02-01 DIAGNOSIS — H4010X4 Unspecified open-angle glaucoma, indeterminate stage: Secondary | ICD-10-CM | POA: Diagnosis not present

## 2017-02-01 DIAGNOSIS — I10 Essential (primary) hypertension: Secondary | ICD-10-CM | POA: Diagnosis not present

## 2017-02-01 DIAGNOSIS — M1711 Unilateral primary osteoarthritis, right knee: Secondary | ICD-10-CM | POA: Diagnosis not present

## 2017-02-01 DIAGNOSIS — J449 Chronic obstructive pulmonary disease, unspecified: Secondary | ICD-10-CM | POA: Diagnosis not present

## 2017-02-05 DIAGNOSIS — E1065 Type 1 diabetes mellitus with hyperglycemia: Secondary | ICD-10-CM | POA: Diagnosis not present

## 2017-02-05 DIAGNOSIS — E1042 Type 1 diabetes mellitus with diabetic polyneuropathy: Secondary | ICD-10-CM | POA: Diagnosis not present

## 2017-02-05 DIAGNOSIS — M5416 Radiculopathy, lumbar region: Secondary | ICD-10-CM | POA: Diagnosis not present

## 2017-02-05 DIAGNOSIS — I10 Essential (primary) hypertension: Secondary | ICD-10-CM | POA: Diagnosis not present

## 2017-02-05 DIAGNOSIS — R21 Rash and other nonspecific skin eruption: Secondary | ICD-10-CM | POA: Diagnosis not present

## 2017-02-05 DIAGNOSIS — R531 Weakness: Secondary | ICD-10-CM | POA: Diagnosis not present

## 2017-02-05 DIAGNOSIS — B351 Tinea unguium: Secondary | ICD-10-CM | POA: Diagnosis not present

## 2017-02-05 DIAGNOSIS — J449 Chronic obstructive pulmonary disease, unspecified: Secondary | ICD-10-CM | POA: Diagnosis not present

## 2017-02-05 DIAGNOSIS — Z7982 Long term (current) use of aspirin: Secondary | ICD-10-CM | POA: Diagnosis not present

## 2017-02-05 DIAGNOSIS — M1711 Unilateral primary osteoarthritis, right knee: Secondary | ICD-10-CM | POA: Diagnosis not present

## 2017-02-05 DIAGNOSIS — H4010X4 Unspecified open-angle glaucoma, indeterminate stage: Secondary | ICD-10-CM | POA: Diagnosis not present

## 2017-02-07 DIAGNOSIS — M1711 Unilateral primary osteoarthritis, right knee: Secondary | ICD-10-CM | POA: Diagnosis not present

## 2017-02-07 DIAGNOSIS — Z7982 Long term (current) use of aspirin: Secondary | ICD-10-CM | POA: Diagnosis not present

## 2017-02-07 DIAGNOSIS — E1065 Type 1 diabetes mellitus with hyperglycemia: Secondary | ICD-10-CM | POA: Diagnosis not present

## 2017-02-07 DIAGNOSIS — J449 Chronic obstructive pulmonary disease, unspecified: Secondary | ICD-10-CM | POA: Diagnosis not present

## 2017-02-07 DIAGNOSIS — R21 Rash and other nonspecific skin eruption: Secondary | ICD-10-CM | POA: Diagnosis not present

## 2017-02-07 DIAGNOSIS — M5416 Radiculopathy, lumbar region: Secondary | ICD-10-CM | POA: Diagnosis not present

## 2017-02-07 DIAGNOSIS — H4010X4 Unspecified open-angle glaucoma, indeterminate stage: Secondary | ICD-10-CM | POA: Diagnosis not present

## 2017-02-07 DIAGNOSIS — E1042 Type 1 diabetes mellitus with diabetic polyneuropathy: Secondary | ICD-10-CM | POA: Diagnosis not present

## 2017-02-07 DIAGNOSIS — I10 Essential (primary) hypertension: Secondary | ICD-10-CM | POA: Diagnosis not present

## 2017-02-07 DIAGNOSIS — R531 Weakness: Secondary | ICD-10-CM | POA: Diagnosis not present

## 2017-02-13 DIAGNOSIS — H4010X4 Unspecified open-angle glaucoma, indeterminate stage: Secondary | ICD-10-CM | POA: Diagnosis not present

## 2017-02-13 DIAGNOSIS — M5416 Radiculopathy, lumbar region: Secondary | ICD-10-CM | POA: Diagnosis not present

## 2017-02-13 DIAGNOSIS — E1065 Type 1 diabetes mellitus with hyperglycemia: Secondary | ICD-10-CM | POA: Diagnosis not present

## 2017-02-13 DIAGNOSIS — R531 Weakness: Secondary | ICD-10-CM | POA: Diagnosis not present

## 2017-02-13 DIAGNOSIS — I10 Essential (primary) hypertension: Secondary | ICD-10-CM | POA: Diagnosis not present

## 2017-02-13 DIAGNOSIS — M1711 Unilateral primary osteoarthritis, right knee: Secondary | ICD-10-CM | POA: Diagnosis not present

## 2017-02-13 DIAGNOSIS — R21 Rash and other nonspecific skin eruption: Secondary | ICD-10-CM | POA: Diagnosis not present

## 2017-02-13 DIAGNOSIS — E1042 Type 1 diabetes mellitus with diabetic polyneuropathy: Secondary | ICD-10-CM | POA: Diagnosis not present

## 2017-02-13 DIAGNOSIS — Z7982 Long term (current) use of aspirin: Secondary | ICD-10-CM | POA: Diagnosis not present

## 2017-02-13 DIAGNOSIS — J449 Chronic obstructive pulmonary disease, unspecified: Secondary | ICD-10-CM | POA: Diagnosis not present

## 2017-02-15 DIAGNOSIS — R21 Rash and other nonspecific skin eruption: Secondary | ICD-10-CM | POA: Diagnosis not present

## 2017-02-15 DIAGNOSIS — M5416 Radiculopathy, lumbar region: Secondary | ICD-10-CM | POA: Diagnosis not present

## 2017-02-15 DIAGNOSIS — H4010X4 Unspecified open-angle glaucoma, indeterminate stage: Secondary | ICD-10-CM | POA: Diagnosis not present

## 2017-02-15 DIAGNOSIS — Z7982 Long term (current) use of aspirin: Secondary | ICD-10-CM | POA: Diagnosis not present

## 2017-02-15 DIAGNOSIS — J449 Chronic obstructive pulmonary disease, unspecified: Secondary | ICD-10-CM | POA: Diagnosis not present

## 2017-02-15 DIAGNOSIS — E1065 Type 1 diabetes mellitus with hyperglycemia: Secondary | ICD-10-CM | POA: Diagnosis not present

## 2017-02-15 DIAGNOSIS — R531 Weakness: Secondary | ICD-10-CM | POA: Diagnosis not present

## 2017-02-15 DIAGNOSIS — M1711 Unilateral primary osteoarthritis, right knee: Secondary | ICD-10-CM | POA: Diagnosis not present

## 2017-02-15 DIAGNOSIS — I10 Essential (primary) hypertension: Secondary | ICD-10-CM | POA: Diagnosis not present

## 2017-02-15 DIAGNOSIS — E1042 Type 1 diabetes mellitus with diabetic polyneuropathy: Secondary | ICD-10-CM | POA: Diagnosis not present

## 2017-02-20 DIAGNOSIS — M1711 Unilateral primary osteoarthritis, right knee: Secondary | ICD-10-CM | POA: Diagnosis not present

## 2017-02-20 DIAGNOSIS — E1042 Type 1 diabetes mellitus with diabetic polyneuropathy: Secondary | ICD-10-CM | POA: Diagnosis not present

## 2017-02-20 DIAGNOSIS — I10 Essential (primary) hypertension: Secondary | ICD-10-CM | POA: Diagnosis not present

## 2017-02-20 DIAGNOSIS — Z7982 Long term (current) use of aspirin: Secondary | ICD-10-CM | POA: Diagnosis not present

## 2017-02-20 DIAGNOSIS — R531 Weakness: Secondary | ICD-10-CM | POA: Diagnosis not present

## 2017-02-20 DIAGNOSIS — H4010X4 Unspecified open-angle glaucoma, indeterminate stage: Secondary | ICD-10-CM | POA: Diagnosis not present

## 2017-02-20 DIAGNOSIS — E1065 Type 1 diabetes mellitus with hyperglycemia: Secondary | ICD-10-CM | POA: Diagnosis not present

## 2017-02-20 DIAGNOSIS — R21 Rash and other nonspecific skin eruption: Secondary | ICD-10-CM | POA: Diagnosis not present

## 2017-02-20 DIAGNOSIS — M5416 Radiculopathy, lumbar region: Secondary | ICD-10-CM | POA: Diagnosis not present

## 2017-02-20 DIAGNOSIS — J449 Chronic obstructive pulmonary disease, unspecified: Secondary | ICD-10-CM | POA: Diagnosis not present

## 2017-02-20 DIAGNOSIS — R809 Proteinuria, unspecified: Secondary | ICD-10-CM | POA: Diagnosis not present

## 2017-02-20 DIAGNOSIS — E1129 Type 2 diabetes mellitus with other diabetic kidney complication: Secondary | ICD-10-CM | POA: Diagnosis not present

## 2017-02-21 DIAGNOSIS — E1040 Type 1 diabetes mellitus with diabetic neuropathy, unspecified: Secondary | ICD-10-CM | POA: Diagnosis not present

## 2017-02-21 DIAGNOSIS — C61 Malignant neoplasm of prostate: Secondary | ICD-10-CM | POA: Diagnosis not present

## 2017-02-21 DIAGNOSIS — M1711 Unilateral primary osteoarthritis, right knee: Secondary | ICD-10-CM | POA: Diagnosis not present

## 2017-02-21 DIAGNOSIS — R531 Weakness: Secondary | ICD-10-CM | POA: Diagnosis not present

## 2017-02-21 DIAGNOSIS — E559 Vitamin D deficiency, unspecified: Secondary | ICD-10-CM | POA: Diagnosis not present

## 2017-02-21 DIAGNOSIS — L739 Follicular disorder, unspecified: Secondary | ICD-10-CM | POA: Diagnosis not present

## 2017-02-22 DIAGNOSIS — R531 Weakness: Secondary | ICD-10-CM | POA: Diagnosis not present

## 2017-02-22 DIAGNOSIS — R21 Rash and other nonspecific skin eruption: Secondary | ICD-10-CM | POA: Diagnosis not present

## 2017-02-22 DIAGNOSIS — H4010X4 Unspecified open-angle glaucoma, indeterminate stage: Secondary | ICD-10-CM | POA: Diagnosis not present

## 2017-02-22 DIAGNOSIS — Z7982 Long term (current) use of aspirin: Secondary | ICD-10-CM | POA: Diagnosis not present

## 2017-02-22 DIAGNOSIS — E1042 Type 1 diabetes mellitus with diabetic polyneuropathy: Secondary | ICD-10-CM | POA: Diagnosis not present

## 2017-02-22 DIAGNOSIS — M5416 Radiculopathy, lumbar region: Secondary | ICD-10-CM | POA: Diagnosis not present

## 2017-02-22 DIAGNOSIS — E1065 Type 1 diabetes mellitus with hyperglycemia: Secondary | ICD-10-CM | POA: Diagnosis not present

## 2017-02-22 DIAGNOSIS — I10 Essential (primary) hypertension: Secondary | ICD-10-CM | POA: Diagnosis not present

## 2017-02-22 DIAGNOSIS — J449 Chronic obstructive pulmonary disease, unspecified: Secondary | ICD-10-CM | POA: Diagnosis not present

## 2017-02-22 DIAGNOSIS — M1711 Unilateral primary osteoarthritis, right knee: Secondary | ICD-10-CM | POA: Diagnosis not present

## 2017-02-23 DIAGNOSIS — E1040 Type 1 diabetes mellitus with diabetic neuropathy, unspecified: Secondary | ICD-10-CM | POA: Insufficient documentation

## 2017-02-26 DIAGNOSIS — J449 Chronic obstructive pulmonary disease, unspecified: Secondary | ICD-10-CM | POA: Diagnosis not present

## 2017-02-26 DIAGNOSIS — E1065 Type 1 diabetes mellitus with hyperglycemia: Secondary | ICD-10-CM | POA: Diagnosis not present

## 2017-02-26 DIAGNOSIS — H4010X4 Unspecified open-angle glaucoma, indeterminate stage: Secondary | ICD-10-CM | POA: Diagnosis not present

## 2017-02-26 DIAGNOSIS — M5416 Radiculopathy, lumbar region: Secondary | ICD-10-CM | POA: Diagnosis not present

## 2017-02-26 DIAGNOSIS — E1042 Type 1 diabetes mellitus with diabetic polyneuropathy: Secondary | ICD-10-CM | POA: Diagnosis not present

## 2017-02-26 DIAGNOSIS — Z7982 Long term (current) use of aspirin: Secondary | ICD-10-CM | POA: Diagnosis not present

## 2017-02-26 DIAGNOSIS — R531 Weakness: Secondary | ICD-10-CM | POA: Diagnosis not present

## 2017-02-26 DIAGNOSIS — M1711 Unilateral primary osteoarthritis, right knee: Secondary | ICD-10-CM | POA: Diagnosis not present

## 2017-02-26 DIAGNOSIS — I10 Essential (primary) hypertension: Secondary | ICD-10-CM | POA: Diagnosis not present

## 2017-02-26 DIAGNOSIS — R21 Rash and other nonspecific skin eruption: Secondary | ICD-10-CM | POA: Diagnosis not present

## 2017-03-01 DIAGNOSIS — E1042 Type 1 diabetes mellitus with diabetic polyneuropathy: Secondary | ICD-10-CM | POA: Diagnosis not present

## 2017-03-01 DIAGNOSIS — I10 Essential (primary) hypertension: Secondary | ICD-10-CM | POA: Diagnosis not present

## 2017-03-01 DIAGNOSIS — R531 Weakness: Secondary | ICD-10-CM | POA: Diagnosis not present

## 2017-03-01 DIAGNOSIS — E1065 Type 1 diabetes mellitus with hyperglycemia: Secondary | ICD-10-CM | POA: Diagnosis not present

## 2017-03-01 DIAGNOSIS — J449 Chronic obstructive pulmonary disease, unspecified: Secondary | ICD-10-CM | POA: Diagnosis not present

## 2017-03-01 DIAGNOSIS — Z7982 Long term (current) use of aspirin: Secondary | ICD-10-CM | POA: Diagnosis not present

## 2017-03-01 DIAGNOSIS — M5416 Radiculopathy, lumbar region: Secondary | ICD-10-CM | POA: Diagnosis not present

## 2017-03-01 DIAGNOSIS — R21 Rash and other nonspecific skin eruption: Secondary | ICD-10-CM | POA: Diagnosis not present

## 2017-03-01 DIAGNOSIS — H4010X4 Unspecified open-angle glaucoma, indeterminate stage: Secondary | ICD-10-CM | POA: Diagnosis not present

## 2017-03-01 DIAGNOSIS — M1711 Unilateral primary osteoarthritis, right knee: Secondary | ICD-10-CM | POA: Diagnosis not present

## 2017-03-04 ENCOUNTER — Ambulatory Visit: Payer: Medicare Other | Admitting: Dietician

## 2017-03-06 DIAGNOSIS — I10 Essential (primary) hypertension: Secondary | ICD-10-CM | POA: Diagnosis not present

## 2017-03-06 DIAGNOSIS — Z7982 Long term (current) use of aspirin: Secondary | ICD-10-CM | POA: Diagnosis not present

## 2017-03-06 DIAGNOSIS — E1065 Type 1 diabetes mellitus with hyperglycemia: Secondary | ICD-10-CM | POA: Diagnosis not present

## 2017-03-06 DIAGNOSIS — R531 Weakness: Secondary | ICD-10-CM | POA: Diagnosis not present

## 2017-03-06 DIAGNOSIS — B351 Tinea unguium: Secondary | ICD-10-CM | POA: Diagnosis not present

## 2017-03-06 DIAGNOSIS — Z794 Long term (current) use of insulin: Secondary | ICD-10-CM | POA: Diagnosis not present

## 2017-03-06 DIAGNOSIS — E1140 Type 2 diabetes mellitus with diabetic neuropathy, unspecified: Secondary | ICD-10-CM | POA: Diagnosis not present

## 2017-03-06 DIAGNOSIS — R21 Rash and other nonspecific skin eruption: Secondary | ICD-10-CM | POA: Diagnosis not present

## 2017-03-06 DIAGNOSIS — M5416 Radiculopathy, lumbar region: Secondary | ICD-10-CM | POA: Diagnosis not present

## 2017-03-06 DIAGNOSIS — J449 Chronic obstructive pulmonary disease, unspecified: Secondary | ICD-10-CM | POA: Diagnosis not present

## 2017-03-06 DIAGNOSIS — E1042 Type 1 diabetes mellitus with diabetic polyneuropathy: Secondary | ICD-10-CM | POA: Diagnosis not present

## 2017-03-06 DIAGNOSIS — M1711 Unilateral primary osteoarthritis, right knee: Secondary | ICD-10-CM | POA: Diagnosis not present

## 2017-03-06 DIAGNOSIS — H4010X4 Unspecified open-angle glaucoma, indeterminate stage: Secondary | ICD-10-CM | POA: Diagnosis not present

## 2017-03-07 DIAGNOSIS — C61 Malignant neoplasm of prostate: Secondary | ICD-10-CM | POA: Diagnosis not present

## 2017-03-07 DIAGNOSIS — M1711 Unilateral primary osteoarthritis, right knee: Secondary | ICD-10-CM | POA: Diagnosis not present

## 2017-03-07 DIAGNOSIS — E1040 Type 1 diabetes mellitus with diabetic neuropathy, unspecified: Secondary | ICD-10-CM | POA: Diagnosis not present

## 2017-03-11 DIAGNOSIS — Z794 Long term (current) use of insulin: Secondary | ICD-10-CM | POA: Diagnosis not present

## 2017-03-11 DIAGNOSIS — Z79899 Other long term (current) drug therapy: Secondary | ICD-10-CM | POA: Diagnosis not present

## 2017-03-11 DIAGNOSIS — E1165 Type 2 diabetes mellitus with hyperglycemia: Secondary | ICD-10-CM | POA: Diagnosis not present

## 2017-03-12 DIAGNOSIS — H401133 Primary open-angle glaucoma, bilateral, severe stage: Secondary | ICD-10-CM | POA: Diagnosis not present

## 2017-03-12 DIAGNOSIS — H04123 Dry eye syndrome of bilateral lacrimal glands: Secondary | ICD-10-CM | POA: Diagnosis not present

## 2017-03-12 DIAGNOSIS — H524 Presbyopia: Secondary | ICD-10-CM | POA: Diagnosis not present

## 2017-03-13 DIAGNOSIS — Z7982 Long term (current) use of aspirin: Secondary | ICD-10-CM | POA: Diagnosis not present

## 2017-03-13 DIAGNOSIS — M1711 Unilateral primary osteoarthritis, right knee: Secondary | ICD-10-CM | POA: Diagnosis not present

## 2017-03-13 DIAGNOSIS — R21 Rash and other nonspecific skin eruption: Secondary | ICD-10-CM | POA: Diagnosis not present

## 2017-03-13 DIAGNOSIS — R531 Weakness: Secondary | ICD-10-CM | POA: Diagnosis not present

## 2017-03-13 DIAGNOSIS — H4010X4 Unspecified open-angle glaucoma, indeterminate stage: Secondary | ICD-10-CM | POA: Diagnosis not present

## 2017-03-13 DIAGNOSIS — E1042 Type 1 diabetes mellitus with diabetic polyneuropathy: Secondary | ICD-10-CM | POA: Diagnosis not present

## 2017-03-13 DIAGNOSIS — J449 Chronic obstructive pulmonary disease, unspecified: Secondary | ICD-10-CM | POA: Diagnosis not present

## 2017-03-13 DIAGNOSIS — I10 Essential (primary) hypertension: Secondary | ICD-10-CM | POA: Diagnosis not present

## 2017-03-13 DIAGNOSIS — E1065 Type 1 diabetes mellitus with hyperglycemia: Secondary | ICD-10-CM | POA: Diagnosis not present

## 2017-03-13 DIAGNOSIS — M5416 Radiculopathy, lumbar region: Secondary | ICD-10-CM | POA: Diagnosis not present

## 2017-03-15 DIAGNOSIS — H4010X4 Unspecified open-angle glaucoma, indeterminate stage: Secondary | ICD-10-CM | POA: Diagnosis not present

## 2017-03-15 DIAGNOSIS — R531 Weakness: Secondary | ICD-10-CM | POA: Diagnosis not present

## 2017-03-15 DIAGNOSIS — R21 Rash and other nonspecific skin eruption: Secondary | ICD-10-CM | POA: Diagnosis not present

## 2017-03-15 DIAGNOSIS — M1711 Unilateral primary osteoarthritis, right knee: Secondary | ICD-10-CM | POA: Diagnosis not present

## 2017-03-15 DIAGNOSIS — I10 Essential (primary) hypertension: Secondary | ICD-10-CM | POA: Diagnosis not present

## 2017-03-15 DIAGNOSIS — M5416 Radiculopathy, lumbar region: Secondary | ICD-10-CM | POA: Diagnosis not present

## 2017-03-15 DIAGNOSIS — E1065 Type 1 diabetes mellitus with hyperglycemia: Secondary | ICD-10-CM | POA: Diagnosis not present

## 2017-03-15 DIAGNOSIS — J449 Chronic obstructive pulmonary disease, unspecified: Secondary | ICD-10-CM | POA: Diagnosis not present

## 2017-03-15 DIAGNOSIS — E1042 Type 1 diabetes mellitus with diabetic polyneuropathy: Secondary | ICD-10-CM | POA: Diagnosis not present

## 2017-03-15 DIAGNOSIS — Z7982 Long term (current) use of aspirin: Secondary | ICD-10-CM | POA: Diagnosis not present

## 2017-03-18 ENCOUNTER — Encounter: Payer: Self-pay | Admitting: Dietician

## 2017-03-18 ENCOUNTER — Encounter: Payer: Medicare Other | Attending: Internal Medicine | Admitting: Dietician

## 2017-03-18 VITALS — BP 118/78 | Ht 72.5 in | Wt 194.6 lb

## 2017-03-18 DIAGNOSIS — E119 Type 2 diabetes mellitus without complications: Secondary | ICD-10-CM | POA: Insufficient documentation

## 2017-03-18 DIAGNOSIS — Z713 Dietary counseling and surveillance: Secondary | ICD-10-CM | POA: Insufficient documentation

## 2017-03-18 DIAGNOSIS — E1149 Type 2 diabetes mellitus with other diabetic neurological complication: Secondary | ICD-10-CM

## 2017-03-18 DIAGNOSIS — Z794 Long term (current) use of insulin: Secondary | ICD-10-CM

## 2017-03-18 NOTE — Patient Instructions (Signed)
  Check blood sugars 4 x day before each meal and before bed every day and record Avoid sugar sweetened drinks (soda, tea, coffee, sports drinks, juices) Limit intake of sweets and fried foods Eat 3 meals day,  1 snack a day at bedtime Eat 3 carbohydrate servings/meal + protein Eat 1 carbohydrate serving/snack + protein Drink plenty of water Space meals 4-5 hours apart Bring blood sugar records to the next appointment/class Get a Regulatory affairs officer fast acting glucose and a snack at all times Rotate injection sites Take Humalog and Lantus as directed by MD Take Humalog 15 min.before eating meals if able Take extra Humalog for BG>150 as instructed by MD per  sliding scale Get urine ketone strips and check if BG>250 Return for appointment/classes on: 03-21-17

## 2017-03-18 NOTE — Progress Notes (Signed)
Diabetes Self-Management Education  Visit Type: First/Initial  Appt. Start Time:1330 Appt. End Time:1500  03/18/2017  Mr. Dylan Quinn, identified by name and date of birth, is a 81 y.o. male with a diagnosis of Diabetes: Type 2 (?type 1 per patient).   ASSESSMENT  Blood pressure 118/78, height 6' 0.5" (1.842 m), weight 194 lb 9.6 oz (88.3 kg). Body mass index is 26.03 kg/m.      Diabetes Self-Management Education - 03/18/17 1612      Visit Information   Visit Type First/Initial     Initial Visit   Diabetes Type Type 2  ?type 1 per patient     Health Coping   How would you rate your overall health? Fair     Psychosocial Assessment   Patient Belief/Attitude about Diabetes Motivated to manage diabetes   Self-care barriers Unsteady gait/risk for falls   Other persons present Spouse/SO   Patient Concerns Healthy Lifestyle;Glycemic Control;Medication;Monitoring   Special Needs None   Preferred Learning Style Visual;Auditory;Hands on   Learning Readiness Ready   What is the last grade level you completed in school? BA degree     Pre-Education Assessment   Patient understands the diabetes disease and treatment process. Needs Review   Patient understands incorporating nutritional management into lifestyle. Needs Review   Patient undertands incorporating physical activity into lifestyle. Needs Review  exercise is limited due to severe pain right knee with arthritis   Patient understands using medications safely. Needs Review   Patient understands monitoring blood glucose, interpreting and using results Needs Review   Patient understands prevention, detection, and treatment of acute complications. Needs Review   Patient understands prevention, detection, and treatment of chronic complications. Needs Review   Patient understands how to develop strategies to address psychosocial issues. Needs Review   Patient understands how to develop strategies to promote health/change  behavior. Needs Review     Complications   Last HgB A1C per patient/outside source 10.6 %  01-2017   How often do you check your blood sugar? > 4 times/day (recent BG's vary 45-510)   Have you had a dilated eye exam in the past 12 months? Yes  05-2016   Have you had a dental exam in the past 12 months? No  12-2015; appt 03-2017   Are you checking your feet? Yes   How many days per week are you checking your feet? 7     Dietary Intake   Breakfast --  eats breakfast at 10a-often eats raisin bran, milk and banana   Snack (morning) --  none   Lunch --  eats lunch at 3p-eats fried foods, sweets and snack foods 2-3x/wk.   Snack (afternoon) --  none   Dinner --  eats supper at 7p   Snack (evening) --  eats snack at 9p (before checking bedtime BG)   Beverage(s) --  drinks water 6-7x/day and sugar free drinks 6-7x/day; milk 1x/day; drinks occasional fruit juice     Exercise   Exercise Type Other (comment)  PT 30 min. 2x/wk.   How many days per week to you exercise? 2   How many minutes per day do you exercise? 30   Total minutes per week of exercise 60     Patient Education   Previous Diabetes Education Yes (please comment)  in [place] about 20 years ago   Disease state  Definition of diabetes, type 1 and 2, and the diagnosis of diabetes   Nutrition management  Role of diet in the treatment  of diabetes and the relationship between the three main macronutrients and blood glucose level;Food label reading, portion sizes and measuring food.;Carbohydrate counting   Medications Taught/reviewed insulin injection, site rotation, insulin storage and needle disposal.;Reviewed patients medication for diabetes, action, purpose, timing of dose and side effects.  reviewed use of Lantus and Humalog insulins along with use of sliding scale for Humalog as per MD instructions    Monitoring Purpose and frequency of SMBG.;Yearly dilated eye exam   Acute complications Taught treatment of hypoglycemia -  the 15 rule.;Discussed and identified patients' treatment of hyperglycemia.;Trained/discussed glucagon administration to patient and designated other.   Chronic complications Relationship between chronic complications and blood glucose control;Dental care;Retinopathy and reason for yearly dilated eye exams   Psychosocial adjustment Role of stress on diabetes   Personal strategies to promote health Lifestyle issues that need to be addressed for better diabetes care;Helped patient develop diabetes management plan for (enter comment)      Individualized Plan for Diabetes Self-Management Training:   Learning Objective:  Patient will have a greater understanding of diabetes self-management. Patient education plan is to attend individual and/or group sessions per assessed needs and concerns.   Plan:   Patient Instructions   Check blood sugars 4 x day before each meal and before bed every day and record Avoid sugar sweetened drinks (soda, tea, coffee, sports drinks, juices) Limit intake of sweets and fried foods Eat 3 meals day,  1 snack a day at bedtime Eat 3 carbohydrate servings/meal + protein Eat 1 carbohydrate serving/snack + protein Drink plenty of water Space meals 4-5 hours apart Bring blood sugar records to the next appointment/class Get a Regulatory affairs officer fast acting glucose and a snack at all times Rotate injection sites Take Humalog and Lantus as directed by MD Take Humalog 15 min.before eating meals if able Take extra Humalog for BG>150 as instructed by MD per  sliding scale Get urine ketone strips and check if BG>250 Return for appointment/classes on: 03-21-17   Expected Outcomes:   positive  Education material provided: General meal planning guidelines, high and low BG handouts  If problems or questions, patient to contact team via:  [phone]  Future DSME appointment:  03-21-17

## 2017-03-19 DIAGNOSIS — J449 Chronic obstructive pulmonary disease, unspecified: Secondary | ICD-10-CM | POA: Diagnosis not present

## 2017-03-19 DIAGNOSIS — I10 Essential (primary) hypertension: Secondary | ICD-10-CM | POA: Diagnosis not present

## 2017-03-19 DIAGNOSIS — H4010X4 Unspecified open-angle glaucoma, indeterminate stage: Secondary | ICD-10-CM | POA: Diagnosis not present

## 2017-03-19 DIAGNOSIS — R21 Rash and other nonspecific skin eruption: Secondary | ICD-10-CM | POA: Diagnosis not present

## 2017-03-19 DIAGNOSIS — Z7982 Long term (current) use of aspirin: Secondary | ICD-10-CM | POA: Diagnosis not present

## 2017-03-19 DIAGNOSIS — E1042 Type 1 diabetes mellitus with diabetic polyneuropathy: Secondary | ICD-10-CM | POA: Diagnosis not present

## 2017-03-19 DIAGNOSIS — R531 Weakness: Secondary | ICD-10-CM | POA: Diagnosis not present

## 2017-03-19 DIAGNOSIS — M1711 Unilateral primary osteoarthritis, right knee: Secondary | ICD-10-CM | POA: Diagnosis not present

## 2017-03-19 DIAGNOSIS — E1065 Type 1 diabetes mellitus with hyperglycemia: Secondary | ICD-10-CM | POA: Diagnosis not present

## 2017-03-19 DIAGNOSIS — M5416 Radiculopathy, lumbar region: Secondary | ICD-10-CM | POA: Diagnosis not present

## 2017-03-20 DIAGNOSIS — I10 Essential (primary) hypertension: Secondary | ICD-10-CM | POA: Diagnosis not present

## 2017-03-20 DIAGNOSIS — Z7982 Long term (current) use of aspirin: Secondary | ICD-10-CM | POA: Diagnosis not present

## 2017-03-20 DIAGNOSIS — H4010X4 Unspecified open-angle glaucoma, indeterminate stage: Secondary | ICD-10-CM | POA: Diagnosis not present

## 2017-03-20 DIAGNOSIS — E1042 Type 1 diabetes mellitus with diabetic polyneuropathy: Secondary | ICD-10-CM | POA: Diagnosis not present

## 2017-03-20 DIAGNOSIS — M1711 Unilateral primary osteoarthritis, right knee: Secondary | ICD-10-CM | POA: Diagnosis not present

## 2017-03-20 DIAGNOSIS — R21 Rash and other nonspecific skin eruption: Secondary | ICD-10-CM | POA: Diagnosis not present

## 2017-03-20 DIAGNOSIS — R531 Weakness: Secondary | ICD-10-CM | POA: Diagnosis not present

## 2017-03-20 DIAGNOSIS — M5416 Radiculopathy, lumbar region: Secondary | ICD-10-CM | POA: Diagnosis not present

## 2017-03-20 DIAGNOSIS — J449 Chronic obstructive pulmonary disease, unspecified: Secondary | ICD-10-CM | POA: Diagnosis not present

## 2017-03-20 DIAGNOSIS — E1065 Type 1 diabetes mellitus with hyperglycemia: Secondary | ICD-10-CM | POA: Diagnosis not present

## 2017-03-21 ENCOUNTER — Encounter: Payer: Medicare Other | Admitting: Dietician

## 2017-03-21 ENCOUNTER — Encounter: Payer: Self-pay | Admitting: Dietician

## 2017-03-21 VITALS — Wt 199.2 lb

## 2017-03-21 DIAGNOSIS — Z713 Dietary counseling and surveillance: Secondary | ICD-10-CM | POA: Diagnosis not present

## 2017-03-21 DIAGNOSIS — E1149 Type 2 diabetes mellitus with other diabetic neurological complication: Secondary | ICD-10-CM

## 2017-03-21 DIAGNOSIS — E119 Type 2 diabetes mellitus without complications: Secondary | ICD-10-CM | POA: Diagnosis not present

## 2017-03-21 DIAGNOSIS — Z794 Long term (current) use of insulin: Secondary | ICD-10-CM

## 2017-03-21 NOTE — Progress Notes (Signed)

## 2017-03-27 DIAGNOSIS — Z7982 Long term (current) use of aspirin: Secondary | ICD-10-CM | POA: Diagnosis not present

## 2017-03-27 DIAGNOSIS — I10 Essential (primary) hypertension: Secondary | ICD-10-CM | POA: Diagnosis not present

## 2017-03-27 DIAGNOSIS — J449 Chronic obstructive pulmonary disease, unspecified: Secondary | ICD-10-CM | POA: Diagnosis not present

## 2017-03-27 DIAGNOSIS — M1711 Unilateral primary osteoarthritis, right knee: Secondary | ICD-10-CM | POA: Diagnosis not present

## 2017-03-27 DIAGNOSIS — R531 Weakness: Secondary | ICD-10-CM | POA: Diagnosis not present

## 2017-03-27 DIAGNOSIS — E1065 Type 1 diabetes mellitus with hyperglycemia: Secondary | ICD-10-CM | POA: Diagnosis not present

## 2017-03-27 DIAGNOSIS — H4010X4 Unspecified open-angle glaucoma, indeterminate stage: Secondary | ICD-10-CM | POA: Diagnosis not present

## 2017-03-27 DIAGNOSIS — M5416 Radiculopathy, lumbar region: Secondary | ICD-10-CM | POA: Diagnosis not present

## 2017-03-27 DIAGNOSIS — R21 Rash and other nonspecific skin eruption: Secondary | ICD-10-CM | POA: Diagnosis not present

## 2017-03-27 DIAGNOSIS — E1042 Type 1 diabetes mellitus with diabetic polyneuropathy: Secondary | ICD-10-CM | POA: Diagnosis not present

## 2017-03-28 ENCOUNTER — Encounter: Payer: Medicare Other | Attending: Internal Medicine | Admitting: *Deleted

## 2017-03-28 ENCOUNTER — Encounter: Payer: Self-pay | Admitting: *Deleted

## 2017-03-28 VITALS — Wt 202.0 lb

## 2017-03-28 DIAGNOSIS — Z713 Dietary counseling and surveillance: Secondary | ICD-10-CM | POA: Insufficient documentation

## 2017-03-28 DIAGNOSIS — Z794 Long term (current) use of insulin: Secondary | ICD-10-CM

## 2017-03-28 DIAGNOSIS — E1149 Type 2 diabetes mellitus with other diabetic neurological complication: Secondary | ICD-10-CM

## 2017-03-28 DIAGNOSIS — E119 Type 2 diabetes mellitus without complications: Secondary | ICD-10-CM | POA: Insufficient documentation

## 2017-03-28 NOTE — Progress Notes (Signed)
During class pt checked his blood sugar with reading of 469 mg/dL. He reports that he didn't take his insulin this morning. He did eat oatmeal before class. Pt injected Lantus and Humalog. Discussed having meals on regular basis and allowing 2-3 hours between meals and snacks.   Appt. Start Time: 0900 Appt. End Time: 1200  Class 2 Nutritional Management - identify sources of carbohydrate, protein and fat; plan balanced meals; estimate servings of carbohydrates in meals  Psychosocial - identify DM as a source of stress; state the effects of stress on BG control  Exercise - describe the effects of exercise on blood glucose and importance of regular exercise in controlling diabetes; state a plan for personal exercise; verbalize contraindications for exercise  Self-Monitoring - state importance of SMBG; use SMBG results to effectively manage diabetes; identify importance of regular HbA1C testing and goals for results  Acute Complications - recognize hyperglycemia and hypoglycemia with causes and effects; identify blood glucose results as high, low or in control; list steps in treating and preventing high and low blood glucose  Sick Day Guidelines: state appropriate measure to manage blood glucose when ill (need for meds, HBGM plan, when to call physician, need for fluids)  Chronic Complications/Foot, Skin, Eye Dental Care - identify possible long-term complications of diabetes (retinopathy, neuropathy, nephropathy, cardiovascular disease, infections); explain steps in prevention and treatment of chronic complications; state importance of daily self-foot exams; describe how to examine feet and what to look for; explain appropriate eye and dental care  Lifestyle Changes/Goals - state benefits of making appropriate lifestyle changes; identify habits that need to change (meals, tobacco, alcohol); identify strategies to reduce risk factors for personal health  Pregnancy/Sexual Health - state importance  of good blood glucose control in preventing sexual problems (impotence, vaginal dryness, infections, loss of desire)  Teaching Materials Used: Class 2 Slide Packet A1C Pamphlet Foot Care Literature Kidney Test Handout Quick and "Balanced" Meal Ideas Carb Counting and Meal Planning Book Goals for Class 2

## 2017-04-02 DIAGNOSIS — M25561 Pain in right knee: Secondary | ICD-10-CM | POA: Diagnosis not present

## 2017-04-02 DIAGNOSIS — M1711 Unilateral primary osteoarthritis, right knee: Secondary | ICD-10-CM | POA: Diagnosis not present

## 2017-04-03 DIAGNOSIS — M5416 Radiculopathy, lumbar region: Secondary | ICD-10-CM | POA: Diagnosis not present

## 2017-04-03 DIAGNOSIS — M1711 Unilateral primary osteoarthritis, right knee: Secondary | ICD-10-CM | POA: Diagnosis not present

## 2017-04-03 DIAGNOSIS — E1065 Type 1 diabetes mellitus with hyperglycemia: Secondary | ICD-10-CM | POA: Diagnosis not present

## 2017-04-03 DIAGNOSIS — R531 Weakness: Secondary | ICD-10-CM | POA: Diagnosis not present

## 2017-04-03 DIAGNOSIS — H4010X4 Unspecified open-angle glaucoma, indeterminate stage: Secondary | ICD-10-CM | POA: Diagnosis not present

## 2017-04-03 DIAGNOSIS — Z7982 Long term (current) use of aspirin: Secondary | ICD-10-CM | POA: Diagnosis not present

## 2017-04-03 DIAGNOSIS — J449 Chronic obstructive pulmonary disease, unspecified: Secondary | ICD-10-CM | POA: Diagnosis not present

## 2017-04-03 DIAGNOSIS — E1042 Type 1 diabetes mellitus with diabetic polyneuropathy: Secondary | ICD-10-CM | POA: Diagnosis not present

## 2017-04-03 DIAGNOSIS — I10 Essential (primary) hypertension: Secondary | ICD-10-CM | POA: Diagnosis not present

## 2017-04-03 DIAGNOSIS — R21 Rash and other nonspecific skin eruption: Secondary | ICD-10-CM | POA: Diagnosis not present

## 2017-04-04 ENCOUNTER — Encounter: Payer: Medicare Other | Admitting: Dietician

## 2017-04-04 ENCOUNTER — Encounter: Payer: Self-pay | Admitting: Dietician

## 2017-04-04 VITALS — BP 140/84 | Wt 196.1 lb

## 2017-04-04 DIAGNOSIS — Z79899 Other long term (current) drug therapy: Secondary | ICD-10-CM | POA: Diagnosis not present

## 2017-04-04 DIAGNOSIS — Z794 Long term (current) use of insulin: Secondary | ICD-10-CM | POA: Diagnosis not present

## 2017-04-04 DIAGNOSIS — E1165 Type 2 diabetes mellitus with hyperglycemia: Secondary | ICD-10-CM | POA: Diagnosis not present

## 2017-04-04 DIAGNOSIS — E119 Type 2 diabetes mellitus without complications: Secondary | ICD-10-CM | POA: Diagnosis not present

## 2017-04-04 DIAGNOSIS — E1040 Type 1 diabetes mellitus with diabetic neuropathy, unspecified: Secondary | ICD-10-CM

## 2017-04-04 DIAGNOSIS — Z713 Dietary counseling and surveillance: Secondary | ICD-10-CM | POA: Diagnosis not present

## 2017-04-04 DIAGNOSIS — E162 Hypoglycemia, unspecified: Secondary | ICD-10-CM | POA: Diagnosis not present

## 2017-04-04 NOTE — Progress Notes (Signed)

## 2017-04-05 DIAGNOSIS — R21 Rash and other nonspecific skin eruption: Secondary | ICD-10-CM | POA: Diagnosis not present

## 2017-04-05 DIAGNOSIS — H4010X4 Unspecified open-angle glaucoma, indeterminate stage: Secondary | ICD-10-CM | POA: Diagnosis not present

## 2017-04-05 DIAGNOSIS — E1042 Type 1 diabetes mellitus with diabetic polyneuropathy: Secondary | ICD-10-CM | POA: Diagnosis not present

## 2017-04-05 DIAGNOSIS — M5416 Radiculopathy, lumbar region: Secondary | ICD-10-CM | POA: Diagnosis not present

## 2017-04-05 DIAGNOSIS — I10 Essential (primary) hypertension: Secondary | ICD-10-CM | POA: Diagnosis not present

## 2017-04-05 DIAGNOSIS — E1065 Type 1 diabetes mellitus with hyperglycemia: Secondary | ICD-10-CM | POA: Diagnosis not present

## 2017-04-05 DIAGNOSIS — J449 Chronic obstructive pulmonary disease, unspecified: Secondary | ICD-10-CM | POA: Diagnosis not present

## 2017-04-05 DIAGNOSIS — M1711 Unilateral primary osteoarthritis, right knee: Secondary | ICD-10-CM | POA: Diagnosis not present

## 2017-04-05 DIAGNOSIS — Z7982 Long term (current) use of aspirin: Secondary | ICD-10-CM | POA: Diagnosis not present

## 2017-04-05 DIAGNOSIS — R531 Weakness: Secondary | ICD-10-CM | POA: Diagnosis not present

## 2017-04-08 ENCOUNTER — Encounter: Payer: Self-pay | Admitting: Dietician

## 2017-05-06 DIAGNOSIS — E1042 Type 1 diabetes mellitus with diabetic polyneuropathy: Secondary | ICD-10-CM | POA: Diagnosis not present

## 2017-05-06 DIAGNOSIS — R21 Rash and other nonspecific skin eruption: Secondary | ICD-10-CM | POA: Diagnosis not present

## 2017-05-06 DIAGNOSIS — R531 Weakness: Secondary | ICD-10-CM | POA: Diagnosis not present

## 2017-05-06 DIAGNOSIS — M1711 Unilateral primary osteoarthritis, right knee: Secondary | ICD-10-CM | POA: Diagnosis not present

## 2017-05-06 DIAGNOSIS — E1065 Type 1 diabetes mellitus with hyperglycemia: Secondary | ICD-10-CM | POA: Diagnosis not present

## 2017-06-04 DIAGNOSIS — E1165 Type 2 diabetes mellitus with hyperglycemia: Secondary | ICD-10-CM | POA: Diagnosis not present

## 2017-06-04 DIAGNOSIS — Z794 Long term (current) use of insulin: Secondary | ICD-10-CM | POA: Diagnosis not present

## 2017-07-02 DIAGNOSIS — I10 Essential (primary) hypertension: Secondary | ICD-10-CM | POA: Diagnosis not present

## 2017-07-02 DIAGNOSIS — C61 Malignant neoplasm of prostate: Secondary | ICD-10-CM | POA: Diagnosis not present

## 2017-07-02 DIAGNOSIS — Z13220 Encounter for screening for lipoid disorders: Secondary | ICD-10-CM | POA: Diagnosis not present

## 2017-07-02 DIAGNOSIS — Z794 Long term (current) use of insulin: Secondary | ICD-10-CM | POA: Diagnosis not present

## 2017-07-02 DIAGNOSIS — E1140 Type 2 diabetes mellitus with diabetic neuropathy, unspecified: Secondary | ICD-10-CM | POA: Diagnosis not present

## 2017-07-03 DIAGNOSIS — I10 Essential (primary) hypertension: Secondary | ICD-10-CM | POA: Diagnosis not present

## 2017-07-03 DIAGNOSIS — M1711 Unilateral primary osteoarthritis, right knee: Secondary | ICD-10-CM | POA: Diagnosis not present

## 2017-07-03 DIAGNOSIS — E1040 Type 1 diabetes mellitus with diabetic neuropathy, unspecified: Secondary | ICD-10-CM | POA: Diagnosis not present

## 2017-07-03 DIAGNOSIS — C61 Malignant neoplasm of prostate: Secondary | ICD-10-CM | POA: Diagnosis not present

## 2017-07-03 DIAGNOSIS — Z0000 Encounter for general adult medical examination without abnormal findings: Secondary | ICD-10-CM | POA: Diagnosis not present

## 2017-07-16 DIAGNOSIS — G4733 Obstructive sleep apnea (adult) (pediatric): Secondary | ICD-10-CM | POA: Diagnosis not present

## 2017-07-22 DIAGNOSIS — Z794 Long term (current) use of insulin: Secondary | ICD-10-CM | POA: Diagnosis not present

## 2017-07-22 DIAGNOSIS — E1140 Type 2 diabetes mellitus with diabetic neuropathy, unspecified: Secondary | ICD-10-CM | POA: Diagnosis not present

## 2017-07-22 DIAGNOSIS — B351 Tinea unguium: Secondary | ICD-10-CM | POA: Diagnosis not present

## 2017-08-01 DIAGNOSIS — I129 Hypertensive chronic kidney disease with stage 1 through stage 4 chronic kidney disease, or unspecified chronic kidney disease: Secondary | ICD-10-CM | POA: Diagnosis not present

## 2017-08-01 DIAGNOSIS — R809 Proteinuria, unspecified: Secondary | ICD-10-CM | POA: Diagnosis not present

## 2017-08-01 DIAGNOSIS — E1122 Type 2 diabetes mellitus with diabetic chronic kidney disease: Secondary | ICD-10-CM | POA: Diagnosis not present

## 2017-08-01 DIAGNOSIS — N183 Chronic kidney disease, stage 3 (moderate): Secondary | ICD-10-CM | POA: Diagnosis not present

## 2017-08-09 DIAGNOSIS — H401133 Primary open-angle glaucoma, bilateral, severe stage: Secondary | ICD-10-CM | POA: Diagnosis not present

## 2017-08-09 DIAGNOSIS — E119 Type 2 diabetes mellitus without complications: Secondary | ICD-10-CM | POA: Diagnosis not present

## 2017-08-09 DIAGNOSIS — E113291 Type 2 diabetes mellitus with mild nonproliferative diabetic retinopathy without macular edema, right eye: Secondary | ICD-10-CM | POA: Diagnosis not present

## 2017-08-09 DIAGNOSIS — E083212 Diabetes mellitus due to underlying condition with mild nonproliferative diabetic retinopathy with macular edema, left eye: Secondary | ICD-10-CM | POA: Diagnosis not present

## 2017-08-16 DIAGNOSIS — G4733 Obstructive sleep apnea (adult) (pediatric): Secondary | ICD-10-CM | POA: Diagnosis not present

## 2017-08-20 DIAGNOSIS — H359 Unspecified retinal disorder: Secondary | ICD-10-CM | POA: Diagnosis not present

## 2017-08-20 DIAGNOSIS — H35043 Retinal micro-aneurysms, unspecified, bilateral: Secondary | ICD-10-CM | POA: Diagnosis not present

## 2017-08-20 DIAGNOSIS — E113391 Type 2 diabetes mellitus with moderate nonproliferative diabetic retinopathy without macular edema, right eye: Secondary | ICD-10-CM | POA: Diagnosis not present

## 2017-08-20 DIAGNOSIS — H3563 Retinal hemorrhage, bilateral: Secondary | ICD-10-CM | POA: Diagnosis not present

## 2017-08-20 DIAGNOSIS — E113312 Type 2 diabetes mellitus with moderate nonproliferative diabetic retinopathy with macular edema, left eye: Secondary | ICD-10-CM | POA: Diagnosis not present

## 2017-08-27 DIAGNOSIS — M5442 Lumbago with sciatica, left side: Secondary | ICD-10-CM | POA: Diagnosis not present

## 2017-08-27 DIAGNOSIS — I10 Essential (primary) hypertension: Secondary | ICD-10-CM | POA: Diagnosis not present

## 2017-08-27 DIAGNOSIS — M1711 Unilateral primary osteoarthritis, right knee: Secondary | ICD-10-CM | POA: Diagnosis not present

## 2017-08-27 DIAGNOSIS — E1040 Type 1 diabetes mellitus with diabetic neuropathy, unspecified: Secondary | ICD-10-CM | POA: Diagnosis not present

## 2017-08-28 ENCOUNTER — Other Ambulatory Visit: Payer: Self-pay | Admitting: Internal Medicine

## 2017-08-28 DIAGNOSIS — M5442 Lumbago with sciatica, left side: Secondary | ICD-10-CM

## 2017-09-02 ENCOUNTER — Ambulatory Visit
Admission: RE | Admit: 2017-09-02 | Discharge: 2017-09-02 | Disposition: A | Discharge status: Home / Self Care | Payer: Medicare Other | Source: Ambulatory Visit | Attending: Internal Medicine | Admitting: Internal Medicine

## 2017-09-02 ENCOUNTER — Ambulatory Visit: Payer: Medicare Other

## 2017-09-02 DIAGNOSIS — M1288 Other specific arthropathies, not elsewhere classified, other specified site: Secondary | ICD-10-CM | POA: Insufficient documentation

## 2017-09-02 DIAGNOSIS — M5442 Lumbago with sciatica, left side: Secondary | ICD-10-CM | POA: Diagnosis not present

## 2017-09-02 DIAGNOSIS — M8938 Hypertrophy of bone, other site: Secondary | ICD-10-CM | POA: Insufficient documentation

## 2017-09-02 DIAGNOSIS — M4807 Spinal stenosis, lumbosacral region: Secondary | ICD-10-CM | POA: Diagnosis not present

## 2017-09-02 DIAGNOSIS — M5126 Other intervertebral disc displacement, lumbar region: Secondary | ICD-10-CM | POA: Insufficient documentation

## 2017-09-02 DIAGNOSIS — M48061 Spinal stenosis, lumbar region without neurogenic claudication: Secondary | ICD-10-CM | POA: Insufficient documentation

## 2017-09-12 DIAGNOSIS — M47816 Spondylosis without myelopathy or radiculopathy, lumbar region: Secondary | ICD-10-CM | POA: Diagnosis not present

## 2017-09-12 DIAGNOSIS — M4646 Discitis, unspecified, lumbar region: Secondary | ICD-10-CM | POA: Diagnosis not present

## 2017-09-12 DIAGNOSIS — M48062 Spinal stenosis, lumbar region with neurogenic claudication: Secondary | ICD-10-CM | POA: Diagnosis not present

## 2017-09-16 ENCOUNTER — Other Ambulatory Visit: Payer: Self-pay | Admitting: Neurosurgery

## 2017-09-16 DIAGNOSIS — M4646 Discitis, unspecified, lumbar region: Secondary | ICD-10-CM

## 2017-09-16 DIAGNOSIS — G4733 Obstructive sleep apnea (adult) (pediatric): Secondary | ICD-10-CM | POA: Diagnosis not present

## 2017-09-17 ENCOUNTER — Ambulatory Visit
Admission: RE | Admit: 2017-09-17 | Discharge: 2017-09-17 | Disposition: A | Discharge status: Home / Self Care | Payer: Medicare Other | Source: Ambulatory Visit | Attending: Neurosurgery | Admitting: Neurosurgery

## 2017-09-17 ENCOUNTER — Ambulatory Visit: Payer: Medicare Other

## 2017-09-17 ENCOUNTER — Other Ambulatory Visit: Payer: Self-pay | Admitting: Radiology

## 2017-09-17 DIAGNOSIS — M48062 Spinal stenosis, lumbar region with neurogenic claudication: Secondary | ICD-10-CM | POA: Insufficient documentation

## 2017-09-17 DIAGNOSIS — M4646 Discitis, unspecified, lumbar region: Secondary | ICD-10-CM

## 2017-09-17 DIAGNOSIS — M48061 Spinal stenosis, lumbar region without neurogenic claudication: Secondary | ICD-10-CM | POA: Diagnosis not present

## 2017-09-17 LAB — POCT I-STAT CREATININE: Creatinine, Ser: 1.3 mg/dL — ABNORMAL HIGH (ref 0.61–1.24)

## 2017-09-17 MED ORDER — GADOBENATE DIMEGLUMINE 529 MG/ML IV SOLN
18.0000 mL | Freq: Once | INTRAVENOUS | Status: AC | PRN
  Administered 2017-09-17: 18 mL via INTRAVENOUS

## 2017-09-18 ENCOUNTER — Ambulatory Visit: Payer: Medicare Other

## 2017-09-18 ENCOUNTER — Ambulatory Visit
Admission: RE | Admit: 2017-09-18 | Discharge: 2017-09-18 | Disposition: A | Discharge status: Home / Self Care | Payer: Medicare Other | Source: Ambulatory Visit | Attending: Neurosurgery | Admitting: Neurosurgery

## 2017-09-18 DIAGNOSIS — M4646 Discitis, unspecified, lumbar region: Secondary | ICD-10-CM | POA: Diagnosis not present

## 2017-09-18 DIAGNOSIS — M5116 Intervertebral disc disorders with radiculopathy, lumbar region: Secondary | ICD-10-CM | POA: Diagnosis not present

## 2017-09-18 LAB — GLUCOSE, CAPILLARY
Glucose-Capillary: 56 mg/dL — ABNORMAL LOW (ref 65–99)
Glucose-Capillary: 96 mg/dL (ref 65–99)
Glucose-Capillary: 54 mg/dL — ABNORMAL LOW (ref 65–99)

## 2017-09-18 MED ORDER — MIDAZOLAM HCL 2 MG/2ML IJ SOLN
INTRAMUSCULAR | Status: AC
  Filled 2017-09-18: qty 2

## 2017-09-18 MED ORDER — SODIUM CHLORIDE 0.9 % IV SOLN
INTRAVENOUS | Status: DC
  Administered 2017-09-18: 13:00:00 via INTRAVENOUS

## 2017-09-18 MED ORDER — FENTANYL CITRATE (PF) 100 MCG/2ML IJ SOLN
INTRAMUSCULAR | Status: AC
  Filled 2017-09-18: qty 2

## 2017-09-18 NOTE — Progress Notes (Signed)
Pt sitting up and eating and drinking at this time in NAD.  Discharge and follow up instructions reviewed, pt and family verbalizes understanding.  Pt denies pain at this time.

## 2017-09-18 NOTE — Progress Notes (Signed)
Pt returned from procedure, BG noted to be 54, pt given sandwich tray and apple juice

## 2017-09-18 NOTE — Progress Notes (Addendum)
BG checked upon pt arrival, noted to be 56, Dr. Golden Circle called, per Dr. Golden Circle, allow pt to have 3 glucose tablets.  Pt took 3 glucose tablets provided by wife.  Also, per Dr. Golden Circle, d/c lab orders at this time.

## 2017-09-18 NOTE — Procedures (Signed)
CT disc aspiration at W0-3  Complications:  None  Blood Loss: none  See dictation in canopy pacs

## 2017-09-18 NOTE — Discharge Instructions (Signed)

## 2017-09-18 NOTE — Progress Notes (Signed)
Pt c/o pain at this time, pt allowed to take norco from his home medications.  Pt asking to wait in room another 66min prior to discharge.  Family remains at bedside.

## 2017-09-20 LAB — ACID FAST SMEAR (AFB): SOURCE (AFB): 5

## 2017-09-20 LAB — ACID FAST SMEAR (AFB, MYCOBACTERIA): Acid Fast Smear: NEGATIVE

## 2017-09-23 LAB — AEROBIC/ANAEROBIC CULTURE W GRAM STAIN (SURGICAL/DEEP WOUND): Culture: NO GROWTH

## 2017-09-23 LAB — AEROBIC/ANAEROBIC CULTURE (SURGICAL/DEEP WOUND)

## 2017-09-24 DIAGNOSIS — E113391 Type 2 diabetes mellitus with moderate nonproliferative diabetic retinopathy without macular edema, right eye: Secondary | ICD-10-CM | POA: Diagnosis not present

## 2017-09-24 DIAGNOSIS — E113312 Type 2 diabetes mellitus with moderate nonproliferative diabetic retinopathy with macular edema, left eye: Secondary | ICD-10-CM | POA: Diagnosis not present

## 2017-09-25 DIAGNOSIS — E1040 Type 1 diabetes mellitus with diabetic neuropathy, unspecified: Secondary | ICD-10-CM | POA: Diagnosis not present

## 2017-09-25 DIAGNOSIS — I10 Essential (primary) hypertension: Secondary | ICD-10-CM | POA: Diagnosis not present

## 2017-09-25 DIAGNOSIS — E1159 Type 2 diabetes mellitus with other circulatory complications: Secondary | ICD-10-CM | POA: Diagnosis not present

## 2017-09-25 DIAGNOSIS — E559 Vitamin D deficiency, unspecified: Secondary | ICD-10-CM | POA: Diagnosis not present

## 2017-09-30 DIAGNOSIS — G4733 Obstructive sleep apnea (adult) (pediatric): Secondary | ICD-10-CM | POA: Diagnosis not present

## 2017-10-02 DIAGNOSIS — J159 Unspecified bacterial pneumonia: Secondary | ICD-10-CM | POA: Diagnosis not present

## 2017-10-02 DIAGNOSIS — Z0389 Encounter for observation for other suspected diseases and conditions ruled out: Secondary | ICD-10-CM | POA: Diagnosis not present

## 2017-10-02 DIAGNOSIS — E1159 Type 2 diabetes mellitus with other circulatory complications: Secondary | ICD-10-CM | POA: Diagnosis not present

## 2017-10-02 DIAGNOSIS — E1040 Type 1 diabetes mellitus with diabetic neuropathy, unspecified: Secondary | ICD-10-CM | POA: Diagnosis not present

## 2017-10-02 DIAGNOSIS — M5442 Lumbago with sciatica, left side: Secondary | ICD-10-CM | POA: Diagnosis not present

## 2017-10-02 DIAGNOSIS — C61 Malignant neoplasm of prostate: Secondary | ICD-10-CM | POA: Diagnosis not present

## 2017-10-15 DIAGNOSIS — Z794 Long term (current) use of insulin: Secondary | ICD-10-CM | POA: Diagnosis not present

## 2017-10-15 DIAGNOSIS — E1165 Type 2 diabetes mellitus with hyperglycemia: Secondary | ICD-10-CM | POA: Diagnosis not present

## 2017-10-16 DIAGNOSIS — G4733 Obstructive sleep apnea (adult) (pediatric): Secondary | ICD-10-CM | POA: Diagnosis not present

## 2017-10-17 ENCOUNTER — Encounter: Payer: Self-pay | Admitting: Student in an Organized Health Care Education/Training Program

## 2017-10-17 ENCOUNTER — Ambulatory Visit
Payer: Medicare Other | Attending: Student in an Organized Health Care Education/Training Program | Admitting: Student in an Organized Health Care Education/Training Program

## 2017-10-17 VITALS — BP 117/67 | HR 89 | Temp 97.9°F | Resp 16 | Ht 71.0 in | Wt 176.0 lb

## 2017-10-17 DIAGNOSIS — E1042 Type 1 diabetes mellitus with diabetic polyneuropathy: Secondary | ICD-10-CM | POA: Diagnosis not present

## 2017-10-17 DIAGNOSIS — C61 Malignant neoplasm of prostate: Secondary | ICD-10-CM | POA: Insufficient documentation

## 2017-10-17 DIAGNOSIS — Z7982 Long term (current) use of aspirin: Secondary | ICD-10-CM | POA: Diagnosis not present

## 2017-10-17 DIAGNOSIS — M5416 Radiculopathy, lumbar region: Secondary | ICD-10-CM | POA: Diagnosis not present

## 2017-10-17 DIAGNOSIS — I10 Essential (primary) hypertension: Secondary | ICD-10-CM | POA: Diagnosis not present

## 2017-10-17 DIAGNOSIS — E1021 Type 1 diabetes mellitus with diabetic nephropathy: Secondary | ICD-10-CM | POA: Insufficient documentation

## 2017-10-17 DIAGNOSIS — M9983 Other biomechanical lesions of lumbar region: Secondary | ICD-10-CM

## 2017-10-17 DIAGNOSIS — M549 Dorsalgia, unspecified: Secondary | ICD-10-CM | POA: Insufficient documentation

## 2017-10-17 DIAGNOSIS — M48061 Spinal stenosis, lumbar region without neurogenic claudication: Secondary | ICD-10-CM | POA: Insufficient documentation

## 2017-10-17 DIAGNOSIS — M47816 Spondylosis without myelopathy or radiculopathy, lumbar region: Secondary | ICD-10-CM

## 2017-10-17 DIAGNOSIS — Z794 Long term (current) use of insulin: Secondary | ICD-10-CM | POA: Insufficient documentation

## 2017-10-17 DIAGNOSIS — H409 Unspecified glaucoma: Secondary | ICD-10-CM | POA: Insufficient documentation

## 2017-10-17 DIAGNOSIS — G8929 Other chronic pain: Secondary | ICD-10-CM | POA: Insufficient documentation

## 2017-10-17 DIAGNOSIS — J449 Chronic obstructive pulmonary disease, unspecified: Secondary | ICD-10-CM | POA: Insufficient documentation

## 2017-10-17 DIAGNOSIS — M5136 Other intervertebral disc degeneration, lumbar region: Secondary | ICD-10-CM | POA: Insufficient documentation

## 2017-10-17 DIAGNOSIS — D649 Anemia, unspecified: Secondary | ICD-10-CM | POA: Insufficient documentation

## 2017-10-17 DIAGNOSIS — M1711 Unilateral primary osteoarthritis, right knee: Secondary | ICD-10-CM | POA: Diagnosis not present

## 2017-10-17 NOTE — Progress Notes (Signed)
Safety precautions to be maintained throughout the outpatient stay will include: orient to surroundings, keep bed in low position, maintain call bell within reach at all times, provide assistance with transfer out of bed and ambulation.  

## 2017-10-17 NOTE — Patient Instructions (Signed)
Today we discussed performing a lumbar epidural steroid injection for your back and leg pain. In order for [place] to do this safely, you will need to see the infectious disease doctor that Dr. Cari Caraway referred due to to make sure that you do not have an infection in your lumbar spine.  If there is no concern for infection, we can proceed with lumbar epidural steroid injection. I will place a standing order so that you can schedule the injection if you have been cleared by infectious disease.  If you want to see me for a clinic appointment prior to your epidural that is also okay and you can call the clinic to schedule.

## 2017-10-17 NOTE — Progress Notes (Signed)
Patient's Name: Dylan Quinn  MRN: [id number]  Referring Provider: Meade Maw, MD  DOB: [birth year]  PCP: Tracie Harrier, MD  DOS: 10/17/2017  Note by: Gillis Santa, MD  Service setting: Ambulatory outpatient  Specialty: Interventional Pain Management  Location: [facility] (AMB) Pain Management Facility  Visit type: Initial Patient Evaluation  Patient type: New Patient   Primary Reason(s) for Visit: Encounter for initial evaluation of one or more chronic problems (new to examiner) potentially causing chronic pain, and posing a threat to normal musculoskeletal function. (Level of risk: High) CC: Back Pain (lower)  HPI  Dylan Quinn is a 81 y.o. year old, male patient, who comes today to see [place] for the first time for an initial evaluation of his chronic pain. 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 ([place]); PERIPHERAL NEUROPATHY; Glaucoma; HEARING LOSS; Essential hypertension; Prostate cancer ([place]); Other osteoporosis; PROTEINURIA, MILD; Insomnia; Normocytic anemia; URI (upper respiratory infection); Type 1 diabetes mellitus with nephropathy ([place]); COPD (chronic obstructive pulmonary disease) ([place]); Right knee pain; Primary osteoarthritis of right knee; Diabetic ketoacidosis without coma associated with type 1 diabetes mellitus ([place]); Nausea & vomiting; DKA, type 1 ([place]); Influenza with pneumonia; Staphylococcus aureus bacteremia; and Rash and nonspecific skin eruption on his problem list. Today he comes in for evaluation of his Back Pain (lower)  Pain Assessment: Location: Lower Back Radiating: both upper legs Onset: More than a month ago Duration: Chronic pain Quality:  (piercing) Severity: 7 /10 (self-reported pain score)  Note: Reported level is compatible with observation.                   When using our objective Pain Scale, levels between 6 and 10/10 are said to belong in an emergency room, as it progressively worsens from a 6/10, described as severely limiting,  requiring emergency care not usually available at an outpatient pain management facility. At a 6/10 level, communication becomes difficult and requires great effort. Assistance to reach the emergency department may be required. Facial flushing and profuse sweating along with potentially dangerous increases in heart rate and blood pressure will be evident. Effect on ADL:   Timing: Intermittent Modifying factors: medications  Onset and Duration: Present less than 3 months Cause of pain: Unknown Severity: NAS-11 at its worse: 10/10, NAS-11 at its best: 7/10, NAS-11 now: 7/10 and NAS-11 on the average: 7/10 Timing: Morning, Night and After activity or exercise Aggravating Factors: Bending, Climbing, Kneeling, Nerve blocks, Prolonged standing, Walking and Walking uphill Alleviating Factors: Lying down, Medications and Resting Associated Problems: Numbness, Sadness and Tingling Quality of Pain: Agonizing, Annoying, Cruel, Deep, Disabling, Distressing, Dreadful, Dull, Exhausting and Feeling of constriction Previous Examinations or Tests: Biopsy, CT scan, MRI scan, Neurosurgical evaluation and Orthopedic evaluation Previous Treatments: Epidural steroid injections  The patient comes into the clinics today for the first time for a chronic pain management evaluation.   81 year old male with a history of type 1 diabetes (A1c 10.6) who presents with bilateral axial low back pain that radiates into his bilateral legs. Patient states that pain has been present for an extended period of time but has gone worse over the last few months. Patient endorses pain along his lateral thighs up to his knee and occasionally has pain that radiates into his feet. She does endorse numbness and tingling in his feet which could be secondary to his diabetic peripheral neuropathy. No bowel or bladder dysfunction.  Of note patient was bacteremic and December 2017 when he was admitted for flulike symptoms.  At that time there was  concern for discitis given his bacteremia. Patient was seen by Dr. Cari Quinn and was not deemed a surgical candidate. Patient had a CT-guided biopsy of his L4-L5 disc which was unremarkable. Patient's repeat lumbar MRI is negative for epidural abscess. Dr. Cari Quinn has requested the patient to see infectious disease.  Patient is currently taking hydrocodone 5 mg a day along with meloxicam which he finds somewhat helpful. He is also utilizing Tylenol. Patient also has right knee pain secondary to knee osteoarthritis by his low back and leg pain is most debilitating.  Today I took the time to provide the patient with information regarding my pain practice. The patient was informed that my practice is divided into two sections: an interventional pain management section, as well as a completely separate and distinct medication management section. I explained that I have procedure days for my interventional therapies, and evaluation days for follow-ups and medication management. Because of the amount of documentation required during both, they are kept separated. This means that there is the possibility that he may be scheduled for a procedure on one day, and medication management the next. I have also informed him that because of staffing and facility limitations, I no longer take patients for medication management only. To illustrate the reasons for this, I gave the patient the example of surgeons, and how inappropriate it would be to refer a patient to his/her care, just to write for the post-surgical antibiotics on a surgery done by a different surgeon.   Because interventional pain management is my board-certified specialty, the patient was informed that joining my practice means that they are open to any and all interventional therapies. I made it clear that this does not mean that they will be forced to have any procedures done. What this means is that I believe interventional therapies to be essential  part of the diagnosis and proper management of chronic pain conditions. Therefore, patients not interested in these interventional alternatives will be better served under the care of a different practitioner.  The patient was also made aware of my Comprehensive Pain Management Safety Guidelines where by joining my practice, they limit all of their nerve blocks and joint injections to those done by our practice, for as long as we are retained to manage their care.   Historic Controlled Substance Pharmacotherapy Review  PMP and historical list of controlled substances: Hydrocodone 5 mg, quantity 60, last fill 09/18/2017. MME/day: 10 mg/day Medications: The patient did not bring the medication(s) to the appointment, as requested in our "New Patient Package" Pharmacodynamics: Desired effects: Analgesia: The patient reports >50% benefit. Reported improvement in function: The patient reports medication allows him to accomplish basic ADLs. Clinically meaningful improvement in function (CMIF): Sustained CMIF goals met Perceived effectiveness: Described as relatively effective, allowing for increase in activities of daily living (ADL) Undesirable effects: Side-effects or Adverse reactions: None reported Historical Monitoring: The patient  reports that he does not use drugs. List of all UDS Test(s): Lab Results  Component Value Date   COCAINSCRNUR NONE DETECTED 12/11/2016   THCU NONE DETECTED 12/11/2016   List of all Serum Drug Screening Test(s):  No results found for: AMPHSCRSER, BARBSCRSER, BENZOSCRSER, COCAINSCRSER, PCPSCRSER, PCPQUANT, THCSCRSER, CANNABQUANT, OPIATESCRSER, OXYSCRSER, PROPOXSCRSER Historical Background Evaluation: [place] PMP: Six (6) year initial data search conducted.             PMP NARX Score Report:  Narcotic: 361 Sedative: 310 Stimulant: 0 [place] Department of public safety, offender search: (  Public Information) Non-contributory Risk Assessment Profile: Aberrant behavior:  None observed or detected today Risk factors for fatal opioid overdose: None identified today PMP NARX Overdose Risk Score: 290 Fatal overdose hazard ratio (HR): Calculation deferred Non-fatal overdose hazard ratio (HR): Calculation deferred Risk of opioid abuse or dependence: 0.7-3.0% with doses ? 36 MME/day and 6.1-26% with doses ? 120 MME/day. Substance use disorder (SUD) risk level: Pending results of Medical Psychology Evaluation for SUD Opioid risk tool (ORT) (Total Score): 0     Opioid Risk Tool - 10/17/17 1423      Family History of Substance Abuse   Alcohol Negative   Illegal Drugs Negative   Rx Drugs Negative     Personal History of Substance Abuse   Alcohol Negative   Illegal Drugs Negative   Rx Drugs Negative     Age   Age between 16-45 years  No     History of Preadolescent Sexual Abuse   History of Preadolescent Sexual Abuse Negative or Male     Psychological Disease   Psychological Disease Negative   Depression Negative     Total Score   Opioid Risk Tool Scoring 0   Opioid Risk Interpretation Low Risk     ORT Scoring interpretation table:  Score <3 = Low Risk for SUD  Score between 4-7 = Moderate Risk for SUD  Score >8 = High Risk for Opioid Abuse   PHQ-2 Depression Scale:  Total score: 0  PHQ-2 Scoring interpretation table: (Score and probability of major depressive disorder)  Score 0 = No depression  Score 1 = 15.4% Probability  Score 2 = 21.1% Probability  Score 3 = 38.4% Probability  Score 4 = 45.5% Probability  Score 5 = 56.4% Probability  Score 6 = 78.6% Probability   PHQ-9 Depression Scale:  Total score: 0  PHQ-9 Scoring interpretation table:  Score 0-4 = No depression  Score 5-9 = Mild depression  Score 10-14 = Moderate depression  Score 15-19 = Moderately severe depression  Score 20-27 = Severe depression (2.4 times higher risk of SUD and 2.89 times higher risk of overuse)   Pharmacologic Plan: Med management per PCP.             Initial impression: Discussed lumbar epidural steroid injection once patient has been seen by infectious disease  Meds   Current Outpatient Prescriptions:  .  aspirin (GOODSENSE ASPIRIN) 81 MG chewable tablet, Chew 81 mg by mouth daily. , Disp: , Rfl:  .  brimonidine-timolol (COMBIGAN) 0.2-0.5 % ophthalmic solution, Place 1 drop into both eyes 2 (two) times daily. , Disp: , Rfl:  .  clobetasol cream (TEMOVATE) 0.05 %, Apply 1 application topically 2 (two) times daily as needed (for rash). , Disp: , Rfl:  .  Docusate Sodium (COLACE PO), Take 2 capsules by mouth as needed., Disp: , Rfl:  .  dorzolamide (TRUSOPT) 2 % ophthalmic solution, Place 1 drop into both eyes 2 (two) times daily., Disp: , Rfl:  .  hydrochlorothiazide (MICROZIDE) 12.5 MG capsule, Take 1 capsule by mouth daily., Disp: , Rfl:  .  HYDROcodone-acetaminophen (NORCO/VICODIN) 5-325 MG tablet, Take 1 tablet by mouth every 6 (six) hours as needed for moderate pain., Disp: , Rfl:  .  Insulin Glargine (LANTUS SOLOSTAR) 100 UNIT/ML Solostar Pen, Inject 11 Units into the skin daily. , Disp: , Rfl:  .  insulin lispro (HUMALOG) 100 UNIT/ML injection, Inject 0-10 Units into the skin 3 (three) times daily before meals. + sliding scale if BG>150,  Disp: , Rfl:  .  latanoprost (XALATAN) 0.005 % ophthalmic solution, Place 1 drop into both eyes at bedtime., Disp: , Rfl:  .  levocetirizine (XYZAL) 5 MG tablet, Take 1 tablet by mouth daily. In evening, Disp: , Rfl:  .  meloxicam (MOBIC) 7.5 MG tablet, Take 7.5 mg by mouth daily., Disp: , Rfl:  .  olmesartan (BENICAR) 40 MG tablet, Take 1 tablet (40 mg total) by mouth daily., Disp: 90 tablet, Rfl: 3 .  polyethylene glycol (MIRALAX / GLYCOLAX) packet, Take 17 g by mouth daily., Disp: 14 each, Rfl: 0 .  potassium chloride SA (K-DUR,KLOR-CON) 20 MEQ tablet, Take 2 tablets (40 mEq total) by mouth daily., Disp: 30 tablet, Rfl: 2 .  pregabalin (LYRICA) 100 MG capsule, Take 1 capsule (100 mg total) by mouth  2 (two) times daily., Disp: 180 capsule, Rfl: 1 .  colchicine 0.6 MG tablet, Take 2 tablets by mouth. 2 tablets at first sign of gout flare followed by 1, Disp: , Rfl:  .  Cyanocobalamin (B-12 IJ), Inject 1 mcg as directed every 30 (thirty) days., Disp: , Rfl:  .  diclofenac sodium (VOLTAREN) 1 % GEL, Apply 2 application topically 4 (four) times daily., Disp: , Rfl:  .  glucagon (GLUCAGON EMERGENCY) 1 MG injection, Inject 1 mg into the muscle once as needed. (Patient not taking: Reported on 10/17/2017), Disp: 2 each, Rfl: 1 .  traMADol (ULTRAM) 50 MG tablet, Take 1 tablet (50 mg total) by mouth every 6 (six) hours as needed for moderate pain or severe pain. (Patient not taking: Reported on 10/17/2017), Disp: 30 tablet, Rfl: 0  Imaging Review     Lumbosacral Imaging: Lumbar MR wo contrast:  Results for orders placed during the hospital encounter of 09/02/17  MR LUMBAR SPINE WO CONTRAST   Narrative CLINICAL DATA:  Initial evaluation for acute back pain with left sided sciatica.  EXAM: MRI LUMBAR SPINE WITHOUT CONTRAST  TECHNIQUE: Multiplanar, multisequence MR imaging of the lumbar spine was performed. No intravenous contrast was administered.  COMPARISON:  Comparison made with previous MRI from 02/25/2010.  FINDINGS: Segmentation: Normal segmentation. Lowest well-formed disc labeled the L5-S1 level. Same numbering system is employed as on previous exam.  Alignment: New 7 mm anterolisthesis of L4 on L5. No associated pars defect. Trace anterolisthesis of L3 on L4. Vertebral bodies otherwise normally aligned with preservation of the normal lumbar lordosis.  Vertebrae: Vertebral body heights maintained. No evidence for acute or chronic fracture. Extensive severe reactive endplate changes present about the L4-5 interspace, markedly progressed relative to previous exam. Associated reactive marrow edema within the bilateral L4-5 facets. Additional reactive endplate changes present at  L5-S1. Benign hemangioma partially visualize within the L4 vertebral body. No worrisome osseous lesions.  Conus medullaris: Extends to the L1-2 level and appears normal.  Paraspinal and other soft tissues: Paraspinous soft tissues within normal limits. Scattered parenchymal and parapelvic cyst noted within the kidneys bilaterally. Visualized visceral structures otherwise unremarkable.  Disc levels:  T11-12: Seen only on sagittal projection. Diffuse degenerative disc bulge with intervertebral disc space narrowing, disc desiccation, chronic reactive endplate changes. No stenosis.  T12-L1:  Mild disc bulge.  No stenosis.  L1-2: Mild diffuse disc bulge, progressed from previous. No significant canal or foraminal stenosis.  L2-3: Broad posterior disc bulge with disc desiccation. Disc bulge slightly more prominent on the left, decreased in prominence relative to previous exam. Mild facet and ligamentum flavum hypertrophy. Resultant mild bilateral lateral recess narrowing, improved on the left. No  significant canal stenosis. Foramina remain patent.  L3-4: Trace anterolisthesis. Mild diffuse disc bulge with disc desiccation. Moderate facet and ligamentum flavum hypertrophy, slightly progressed from previous. Resultant mild bilateral lateral recess stenosis, relatively unchanged. Mild bilateral foraminal narrowing also relatively stable, slightly worse on the left.  L4-5: Progressive 7 mm anterolisthesis with loss of disc space height, diffuse disc bulge, and extensive reactive endplate changes. Progressive moderate to advanced facet arthropathy with ligamentum flavum hypertrophy. There is new severe canal and bilateral subarticular stenosis. Thecal sac measures approximately 9 mm in AP diameter. Progressive moderate bilateral L4 foraminal stenosis. Either L4 or L5 nerve roots could be affected bilaterally.  L5-S1: Diffuse disc bulge with intervertebral disc space narrowing and  disc desiccation. Associated chronic reactive endplate changes with marginal endplate osteophytic spurring. Mild facet and ligamentum flavum hypertrophy. No significant canal or subarticular stenosis. Mild to moderate bilateral L5 foraminal narrowing, relatively unchanged.  IMPRESSION: 1. New 7 mm anterolisthesis of L4 on L5 with markedly progressive reactive endplate changes, disc bulge, and facet arthropathy, with new severe canal and bilateral subarticular stenosis, and progressive bilateral L4 foraminal narrowing. Either of the L4 or L5 nerve roots could be affected. 2. Stable mild to moderate foraminal narrowing at L5-S1, potentially affecting either of the exiting L5 nerve roots. 3. Mild disc bulge and facet hypertrophy at L2-3 and L3-4 with resultant mild lateral recess stenosis, improved from previous on the left at L2-3.   Electronically Signed   By: Jeannine Boga M.D.   On: 09/02/2017 17:14    Lumbar MR w/wo contrast:  Results for orders placed during the hospital encounter of 02/25/10  MR Lumbar Spine W Wo Contrast   Narrative Clinical Data: Back pain.  Left leg pain.   MRI LUMBAR SPINE WITHOUT AND WITH CONTRAST   Technique:  Multiplanar and multiecho pulse sequences of the lumbar spine were obtained without and with intravenous contrast.   Contrast: 18 ml Multihance   Comparison: None   Findings: T11-12:  The disc is degenerated.  There are endplate osteophytes there is bulging of the disc.  No significant canal narrowing however.   T12-L1 and L1-2:  Normal.  The distal cord and conus are normal with conus tip at lower L1.   L2-3:  Disc is degenerated.  There is shallow protrusion of disc material more prominent on the left.  There is mild facet and ligamentous hypertrophy.  There is narrowing of both lateral recesses, left worse than right.  Neural compression would be possible on the left lateral recess.   L3-4:  The disc is degenerated and  bulges in a diffuse fashion. There is facet and ligamentous hypertrophy.  There is mild narrowing of both lateral recesses, not grossly compressive.   L4-5:  The disc is degenerated and bulges in a diffuse fashion. There is bilateral facet and ligamentous hypertrophy.  There is narrowing of both lateral recesses.  L5 nerve root irritation could occur on either side.  There is mild foraminal narrowing without definite compression of the exiting L4 nerve roots.   L5-S1:  The disc is degenerated.  There are endplate osteophytes and there is shallow protrusion of disc material slightly more pronounced towards the right.  There is mild facet and ligamentous hypertrophy.  There is foraminal narrowing bilaterally.  Either L5 nerve root could be irritated.  There is mild narrowing of the subarticular lateral recesses.   Incidental note is made of a benign hemangioma within the L4 vertebral body.   There are  some changes of discogenic edema and enhancement within the endplates at L5 and S1, findings could be associated with back pain.   IMPRESSION: L2-3:  Broad-based protrusion of disc material more pronounced towards the left.  Left lateral recess stenosis that could cause neural compression.   L3-4:  Mild lateral recess narrowing, not grossly compressive.   L4-5:  Moderate bilateral lateral recess narrowing.  Neural compression could occur on either side.   L5-S1:  Bilateral neural foraminal narrowing.  Neural compression could occur on either side. Discogenic edema and enhancement of the endplates.  This finding can be associated with back pain.  Provider: Tamera Reason   Lumbar MR w contrast:  Results for orders placed during the hospital encounter of 09/17/17  MR LUMBAR SPINE W CONTRAST   Narrative CLINICAL DATA:  Low back pain.  Abnormal MRI  EXAM: MRI LUMBAR SPINE WITH CONTRAST  CONTRAST:  2m MULTIHANCE GADOBENATE DIMEGLUMINE 529 MG/ML IV SOLN  COMPARISON:  Unenhanced  lumbar MRI 09/02/2017, 02/25/2010  FINDINGS: Postcontrast imaging of the lumbar spine demonstrates diffuse bone marrow enhancement throughout the L4 and L5 vertebra. This extends into the pedicles at L5 bilaterally. There is marked disc space narrowing with endplate irregularity. There is enhancement of the paraspinous soft tissues at this level. No fluid collection or abscess is identified. Bilateral facet degeneration and severe spinal stenosis at L4-5. 7 mm anterolisthesis L4-5.  No other abnormal enhancement is identified.  15 mm slightly complex cyst right kidney unchanged from 2011  IMPRESSION: Diffuse bone marrow edema and enhancement throughout the L4-L5 vertebral bodies. Advanced disc space narrowing at L4-5. Paraspinous soft tissue thickening and enhancement. The imaging findings could be seen with advanced degenerative change versus disc space infection. Negative for epidural abscess. Severe spinal stenosis L4-5.  These results will be called to the ordering clinician or representative by the Radiologist Assistant, and communication documented in the PACS or zVision Dashboard.   Electronically Signed   By: CFranchot GalloM.D.   On: 09/17/2017 14:38     CT-Guided Biopsy:  Results for orders placed during the hospital encounter of 09/18/17  CT BIOPSY   Narrative INDICATION: Back pain and lower leg pain with abnormalities at L4-5 on recent MRI. Request is been made for disc aspiration to rule out discitis.  EXAM: CT-GUIDED ASPIRATION OF THE L4-5 DISC WITH SUBSEQUENT BIOPSY OF THE DISC SPACE.  MEDICATIONS: None.  ANESTHESIA/SEDATION: None  FLUOROSCOPY TIME:  None  COMPLICATIONS: None immediate.  PROCEDURE: Informed written consent was obtained from the patient after a thorough discussion of the procedural risks, benefits and alternatives. All questions were addressed. Maximal Sterile Barrier Technique was utilized including caps, mask, sterile gowns,  sterile gloves, sterile drape, hand hygiene and skin antiseptic. A timeout was performed prior to the initiation of the procedure.  Utilizing CT fluoroscopic guidance and 1% xylocaine as local anesthetic a a 19 gauge guiding needle was placed percutaneously into the disc space at L4-5. Aspiration was performed an yielded approximately 2-3 mL of a a mildly bloody fluid. This was sent for laboratory evaluation. Additionally a single 20 gauge core biopsy of the disc space was obtained and sent along with the aspirated fluid for cultures. The guiding needle was then removed. The patient tolerated procedure well and was returned his room in satisfactory condition.  IMPRESSION: Successful CT-guided aspiration of the L4-5 disc space with subsequent 20 gauge biopsy of the disc space for laboratory evaluation to rule out discitis.   Electronically Signed   By:  Inez Catalina M.D.   On: 09/18/2017 15:41     Knee-R DG 4 views:  Results for orders placed during the hospital encounter of 04/18/16  DG Knee Complete 4 Views Right   Narrative CLINICAL DATA:  Right knee pain and swelling anteriorly for the past 2 months without known injury  EXAM: RIGHT KNEE - COMPLETE 4+ VIEW  COMPARISON:  None in PACs  FINDINGS: The bones are subjectively osteopenic. There is no acute or healing fracture. There is narrowing of the medial joint compartment with mild subarticular reactive change in the medial tibial plateau. There is beaking of the tibial spines. Small spurs arise from the articular margins of the patella.  IMPRESSION: There is moderate osteoarthritic change centered on the medial joint compartment with milder changes of the patellofemoral compartment. There is no acute fracture nor dislocation.   Electronically Signed   By: David  Martinique M.D.   On: 04/18/2016 15:16     Complexity Note: Imaging results reviewed. Results shared with Mr. Starnes, using Layman's terms.                          ROS  Cardiovascular History: Daily Aspirin intake Pulmonary or Respiratory History: Snoring  and Temporary stoppage of breathing during sleep Neurological History: No reported neurological signs or symptoms such as seizures, abnormal skin sensations, urinary and/or fecal incontinence, being born with an abnormal open spine and/or a tethered spinal cord Review of Past Neurological Studies:  Results for orders placed or performed during the hospital encounter of 03/16/05  MR Brain W Wo Contrast   Narrative   Clinical Data: Hypopituitarism.   MRI OF THE BRAIN AND SELLA WITH AND WITHOUT CONTRAST:  Multiplanar T1- and T2-weighted imaging was performed including serial imaging of the pituitary gland after the intravenous administration of 15 cc of Omniscan.   The brain does not show accelerated atrophy. Diffusion imaging does not show any acute or subacute stroke. The pituitary gland shows a normal morphology with a normal volume of pituitary tissue. The upward margin is flat. The infundibulum is midline. After contrast administration, the gland enhances in a heterogeneous manner. There is a 5 cm region of hypoenhancing tissue in the posterior aspect of the gland that could represent a microadenoma.   IMPRESSION:  1. The brain itself has a normal appearance.  2. 5 mm hypoenhancing focus within the posterior aspect of the gland that could represent a pituitary microadenoma.  Provider: Mancel Bale   Psychological-Psychiatric History: No reported psychological or psychiatric signs or symptoms such as difficulty sleeping, anxiety, depression, delusions or hallucinations (schizophrenial), mood swings (bipolar disorders) or suicidal ideations or attempts Gastrointestinal History: Irregular, infrequent bowel movements (Constipation) Genitourinary History: Passing kidney stones Hematological History: No reported hematological signs or symptoms such as prolonged bleeding, low or poor  functioning platelets, bruising or bleeding easily, hereditary bleeding problems, low energy levels due to low hemoglobin or being anemic Endocrine History: High blood sugar requiring insulin (IDDM) Rheumatologic History: No reported rheumatological signs and symptoms such as fatigue, joint pain, tenderness, swelling, redness, heat, stiffness, decreased range of motion, with or without associated rash Musculoskeletal History: Negative for myasthenia gravis, muscular dystrophy, multiple sclerosis or malignant hyperthermia Work History: Retired  Allergies  Mr. Tandy is allergic to lisinopril.  Laboratory Chemistry  Inflammation Markers (CRP: Acute Phase) (ESR: Chronic Phase) Lab Results  Component Value Date   ESRSEDRATE 77 (H) 12/15/2016  Renal Function Markers Lab Results  Component Value Date   BUN 16 12/23/2016   CREATININE 1.30 (H) 09/17/2017   GFRAA >60 12/23/2016   GFRNONAA 56 (L) 12/23/2016                 Hepatic Function Markers Lab Results  Component Value Date   AST 15 12/19/2016   ALT 10 (L) 12/19/2016   ALBUMIN 2.4 (L) 12/19/2016   ALKPHOS 72 12/19/2016                 Electrolytes Lab Results  Component Value Date   NA 130 (L) 12/23/2016   K 4.9 12/23/2016   CL 98 (L) 12/23/2016   CALCIUM 7.9 (L) 12/23/2016   MG 1.8 12/18/2016                 Neuropathy Markers Lab Results  Component Value Date   VITAMINB12 551 12/29/2015                 Bone Pathology Markers Lab Results  Component Value Date   ALKPHOS 72 12/19/2016   CALCIUM 7.9 (L) 12/23/2016   TESTOSTERONE 300.16 (L) 11/24/2010                 Coagulation Parameters Lab Results  Component Value Date   PLT 462 (H) 12/23/2016                 Cardiovascular Markers Lab Results  Component Value Date   BNP 201.8 (H) 12/15/2016   HGB 9.9 (L) 12/23/2016   HCT 29.1 (L) 12/23/2016                 Note: Lab results reviewed.  [place]  Drug: Mr. Lemelin  reports that he  does not use drugs. Alcohol:  reports that he does not drink alcohol. Tobacco:  reports that he has never smoked. He has never used smokeless tobacco. Medical:  has a past medical history of BENIGN PROSTATIC HYPERTROPHY (07/19/2009); COLONIC POLYPS (02/14/2010); DEPRESSION (03/16/2008); DIABETES MELLITUS, TYPE I (06/30/2007); Dyslipidemia; FOLLICULITIS (1/0/9604); GLAUCOMA (07/19/2009); HEARING LOSS (02/14/2010); HYPERTENSION (05/14/2008); HYPOGONADISM, MALE (12/16/2007); Leukopenia; LUMBAR RADICULOPATHY, LEFT (02/23/2010); Other osteoporosis (12/16/2007); PERIPHERAL NEUROPATHY (06/30/2007); Prostate cancer ([place]); PROTEINURIA, MILD (02/14/2010); Psoriasis; and URINARY CALCULUS (12/27/2010). Family: family history includes Cancer in his father; Heart disease in his father; High blood pressure in his unknown relative.  Past Surgical History:  Procedure Laterality Date  . APPENDECTOMY  1986  . CATARACT EXTRACTION  2006  . CATARACT EXTRACTION  1997  . TEE WITHOUT CARDIOVERSION N/A 12/19/2016   Procedure: TRANSESOPHAGEAL ECHOCARDIOGRAM (TEE);  Surgeon: Pixie Casino, MD;  Location: [facility] ENDOSCOPY;  Service: Cardiovascular;  Laterality: N/A;   Active Ambulatory Problems    Diagnosis Date Noted  . 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 PERIPHERAL NEUROPATHY 06/30/2007  . Glaucoma 07/19/2009  . HEARING LOSS 02/14/2010  . Essential hypertension 05/14/2008  . Prostate cancer ([place]) 07/19/2009  . Other osteoporosis 12/16/2007  . PROTEINURIA, MILD 02/14/2010  . Insomnia 07/28/2012  . Normocytic anemia 05/04/2013  . URI (upper respiratory infection) 12/09/2015  . Type 1 diabetes mellitus with nephropathy ([place]) 12/12/2015  . COPD (chronic obstructive pulmonary disease) ([place]) 12/22/2015  . Right knee pain 04/18/2016  . Primary osteoarthritis of right knee 04/18/2016  . Diabetic ketoacidosis without coma associated with type 1 diabetes mellitus ([place]) 12/11/2016  . Nausea &  vomiting 12/11/2016  . DKA, type 1 ([place]) 12/11/2016  . Influenza with pneumonia   .  Staphylococcus aureus bacteremia   . Rash and nonspecific skin eruption 12/31/2016   Resolved Ambulatory Problems    Diagnosis Date Noted  . COLONIC POLYPS 02/14/2010  . HYPOGONADISM, MALE 12/16/2007  . DEPRESSION 03/16/2008  . FOLLICULITIS 72/53/6644  . LUMBAR RADICULOPATHY, LEFT 02/23/2010  . NUMBNESS 10/21/2009  . Cough 03/10/2010  . URINARY CALCULUS 12/27/2010  . ABDOMINAL PAIN 01/04/2011  . Encounter for long-term (current) use of other medications 01/13/2013  . Screening for prostate cancer 01/13/2013  . Sleep disturbance 03/31/2013  . Psoriasis 05/04/2013  . DKA, type 1 ([place]) 05/04/2013  . Allergic urticaria 06/04/2013  . Hypoglycemia 06/17/2013  . Cough 12/12/2015  . COPD exacerbation ([place]) 12/21/2015   Past Medical History:  Diagnosis Date  . BENIGN PROSTATIC HYPERTROPHY 07/19/2009  . COLONIC POLYPS 02/14/2010  . DEPRESSION 03/16/2008  . DIABETES MELLITUS, TYPE I 06/30/2007  . Dyslipidemia   . FOLLICULITIS 0/02/4741  . GLAUCOMA 07/19/2009  . HEARING LOSS 02/14/2010  . HYPERTENSION 05/14/2008  . HYPOGONADISM, MALE 12/16/2007  . Leukopenia   . LUMBAR RADICULOPATHY, LEFT 02/23/2010  . Other osteoporosis 12/16/2007  . PERIPHERAL NEUROPATHY 06/30/2007  . Prostate cancer ([place])   . PROTEINURIA, MILD 02/14/2010  . Psoriasis   . URINARY CALCULUS 12/27/2010   Constitutional Exam  General appearance: Well nourished, well developed, and well hydrated. In no apparent acute distress Vitals:   10/17/17 1412  BP: 117/67  Pulse: 89  Resp: 16  Temp: 97.9 F (36.6 C)  TempSrc: Oral  SpO2: 100%  Weight: 176 lb (79.8 kg)  Height: _0  (1.803 m)   BMI Assessment: Estimated body mass index is 24.55 kg/m as calculated from the following:   Height as of this encounter: _1  (1.803 m).   Weight as of this encounter: 176 lb (79.8 kg).  BMI interpretation table: BMI level Category Range  association with higher incidence of chronic pain  <18 kg/m2 Underweight   18.5-24.9 kg/m2 Ideal body weight   25-29.9 kg/m2 Overweight Increased incidence by 20%  30-34.9 kg/m2 Obese (Class I) Increased incidence by 68%  35-39.9 kg/m2 Severe obesity (Class II) Increased incidence by 136%  >40 kg/m2 Extreme obesity (Class III) Increased incidence by 254%   BMI Readings from Last 4 Encounters:  10/17/17 24.55 kg/m  09/18/17 26.23 kg/m  04/04/17 26.23 kg/m  03/28/17 27.02 kg/m   Wt Readings from Last 4 Encounters:  10/17/17 176 lb (79.8 kg)  09/18/17 196 lb 1.6 oz (89 kg)  04/04/17 196 lb 1.6 oz (89 kg)  03/28/17 202 lb (91.6 kg)  Psych/Mental status: Alert, oriented x 3 (person, place, & time)       Eyes: PERLA Respiratory: No evidence of acute respiratory distress  Cervical Spine Area Exam  Skin & Axial Inspection: No masses, redness, edema, swelling, or associated skin lesions Alignment: Symmetrical Functional ROM: Unrestricted ROM      Stability: No instability detected Muscle Tone/Strength: Functionally intact. No obvious neuro-muscular anomalies detected. Sensory (Neurological): Unimpaired Palpation: No palpable anomalies              Upper Extremity (UE) Exam    Side: Right upper extremity  Side: Left upper extremity  Skin & Extremity Inspection: Skin color, temperature, and hair growth are WNL. No peripheral edema or cyanosis. No masses, redness, swelling, asymmetry, or associated skin lesions. No contractures.  Skin & Extremity Inspection: Skin color, temperature, and hair growth are WNL. No peripheral edema or cyanosis. No masses, redness, swelling, asymmetry, or associated skin lesions. No contractures.  Functional ROM: Unrestricted ROM          Functional ROM: Unrestricted ROM          Muscle Tone/Strength: Functionally intact. No obvious neuro-muscular anomalies detected.  Muscle Tone/Strength: Functionally intact. No obvious neuro-muscular anomalies detected.   Sensory (Neurological): Unimpaired          Sensory (Neurological): Unimpaired          Palpation: No palpable anomalies              Palpation: No palpable anomalies              Specialized Test(s): Deferred         Specialized Test(s): Deferred          Thoracic Spine Area Exam  Skin & Axial Inspection: No masses, redness, or swelling Alignment: Symmetrical Functional ROM: Unrestricted ROM Stability: No instability detected Muscle Tone/Strength: Functionally intact. No obvious neuro-muscular anomalies detected. Sensory (Neurological): Unimpaired Muscle strength & Tone: No palpable anomalies  Lumbar Spine Area Exam  Skin & Axial Inspection: Paravertebral muscle atrophy Alignment: Asymmetric Functional ROM: Decreased ROM      Stability: No instability detected Muscle Tone/Strength: Functionally intact. No obvious neuro-muscular anomalies detected. Sensory (Neurological): Movement-associated pain Palpation: Complains of area being tender to palpation Bilateral Fist Percussion Test Provocative Tests: Lumbar Hyperextension and rotation test: Positive bilaterally for facet joint pain. Lumbar Lateral bending test: Positive due to pain. Patrick's Maneuver: evaluation deferred today                    Gait & Posture Assessment  Ambulation: Limited Gait: Antalgic Posture: Difficulty standing up straight, due to pain   Lower Extremity Exam    Side: Right lower extremity  Side: Left lower extremity  Skin & Extremity Inspection: Skin color, temperature, and hair growth are WNL. No peripheral edema or cyanosis. No masses, redness, swelling, asymmetry, or associated skin lesions. No contractures.  Skin & Extremity Inspection: Skin color, temperature, and hair growth are WNL. No peripheral edema or cyanosis. No masses, redness, swelling, asymmetry, or associated skin lesions. No contractures.  Functional ROM: Unrestricted ROM          Functional ROM: Unrestricted ROM          Muscle  Tone/Strength: Functionally intact. No obvious neuro-muscular anomalies detected.  Muscle Tone/Strength: Functionally intact. No obvious neuro-muscular anomalies detected.  Sensory (Neurological): Unimpaired  Sensory (Neurological): Unimpaired  Palpation: No palpable anomalies  Palpation: No palpable anomalies   Assessment  Primary Diagnosis & Pertinent Problem List: The primary encounter diagnosis was Lumbar radiculopathy. Diagnoses of Lumbar degenerative disc disease, Facet arthropathy, lumbar, Foraminal stenosis of lumbar region, and Lumbar spondylosis were also pertinent to this visit.  Visit Diagnosis (New problems to examiner): 1. Lumbar radiculopathy   2. Lumbar degenerative disc disease   3. Facet arthropathy, lumbar   4. Foraminal stenosis of lumbar region   5. Lumbar spondylosis    81 year old male with a history of type 1 diabetes who presents with axial low back pain with radiation to bilateral lower extremities that has worsened over the last 2-4 months. Low back pain with radiation to his legs could be secondary to multilevel lumbar spondylosis, facet arthropathy, subarticular stenosis most pronounced at L4-L5 with mild disc bulges and mild lateral recess stenosis at L2-L3 and L3-L4. Patient has never had epidural steroid injections and this is something that we discussed. Before proceeding, patient needs to follow-up with infectious disease as Dr. Cari Quinn has recommended to  ensure no active infection and lumbar spine. Patient's lumbar MRI from 09/17/2017 is negative for epidural abscess in his CT-guided biopsy was negative for discitis. The patient has poorly controlled type 1 diabetes thereby increasing his risk of infection with any neuraxial procedure. I would like infectious disease to weigh in as Dr Dylan Quinn has suggested to ensure that there is no active infection in his lumbar spine before we proceed with a L5-S1 ESI. Patient and family members understand potential benefits  and risks. We will follow with infectious disease and contact me for lumbar epidural steroid injection so long as they are cleared from an ID standpoint.  Continue medication management with PCP.  Plan: -Evaluation by infectious disease to ensure no active lumbar spinal infection. Most recent lumbar MRI negative for epidural abscess. CT-guided biopsy negative for discitis. -Tentative L5-S1 ESI so long as no concerns from infectious disease about active lumbar spinal infection. Patient informed that he carries an increased risk of infection secondary to his diabetes which is poorly controlled (elevated A1c independent risk factor for neuraxial infection).  Orders Placed This Encounter  Procedures  . Lumbar Epidural Injection    For leg pain with or without low back pain.    Standing Status:   Standing    Number of Occurrences:   1    Standing Expiration Date:   10/17/2018    Scheduling Instructions:     Side: Midline L5-S1.     Sedation: No Sedation.     Timeframe: PRN Procedure. Patient will call to schedule. Patient must have a note from infectious disease stating that there is no concern for lumbar spinal infection including discitis or abscess for [place] to do procedure safely.    Order Specific Question:   Where will this procedure be performed?    Answer:   [facility]   Time Note: Greater than 50% of the 60 minute(s) of face-to-face time spent with Mr. Obar, was spent in counseling/coordination of care regarding: Mr. Strohm primary cause of pain, the results of his recent test(s) and the treatment plan.    Provider-requested follow-up: No Follow-up on file.  No future appointments.  Primary Care Physician: Tracie Harrier, MD Location: [facility] Note by: Gillis Santa, M.D, Date: 10/17/2017; Time: 4:08 PM  Patient Instructions  Today we discussed performing a lumbar epidural steroid injection for your back and leg pain. In order for  [place] to do this safely, you will need to see the infectious disease doctor that Dr. Cari Quinn referred due to to make sure that you do not have an infection in your lumbar spine.  If there is no concern for infection, we can proceed with lumbar epidural steroid injection. I will place a standing order so that you can schedule the injection if you have been cleared by infectious disease.  If you want to see me for a clinic appointment prior to your epidural that is also okay and you can call the clinic to schedule.

## 2017-10-18 LAB — FUNGUS CULTURE WITH STAIN

## 2017-10-18 LAB — FUNGAL ORGANISM REFLEX

## 2017-10-18 LAB — FUNGUS CULTURE RESULT

## 2017-10-23 DIAGNOSIS — E1140 Type 2 diabetes mellitus with diabetic neuropathy, unspecified: Secondary | ICD-10-CM | POA: Diagnosis not present

## 2017-10-23 DIAGNOSIS — B351 Tinea unguium: Secondary | ICD-10-CM | POA: Diagnosis not present

## 2017-10-23 DIAGNOSIS — Z794 Long term (current) use of insulin: Secondary | ICD-10-CM | POA: Diagnosis not present

## 2017-10-28 DIAGNOSIS — D649 Anemia, unspecified: Secondary | ICD-10-CM | POA: Diagnosis not present

## 2017-10-30 DIAGNOSIS — M545 Low back pain: Secondary | ICD-10-CM | POA: Diagnosis not present

## 2017-10-30 DIAGNOSIS — M9983 Other biomechanical lesions of lumbar region: Secondary | ICD-10-CM | POA: Diagnosis not present

## 2017-10-30 DIAGNOSIS — R7881 Bacteremia: Secondary | ICD-10-CM

## 2017-10-30 DIAGNOSIS — C61 Malignant neoplasm of prostate: Secondary | ICD-10-CM | POA: Diagnosis not present

## 2017-10-30 DIAGNOSIS — R937 Abnormal findings on diagnostic imaging of other parts of musculoskeletal system: Secondary | ICD-10-CM | POA: Diagnosis not present

## 2017-11-02 LAB — ACID FAST CULTURE WITH REFLEXED SENSITIVITIES (MYCOBACTERIA): Acid Fast Culture: NEGATIVE

## 2017-11-02 LAB — ACID FAST CULTURE WITH REFLEXED SENSITIVITIES

## 2017-11-04 DIAGNOSIS — E1040 Type 1 diabetes mellitus with diabetic neuropathy, unspecified: Secondary | ICD-10-CM | POA: Diagnosis not present

## 2017-11-04 DIAGNOSIS — M5442 Lumbago with sciatica, left side: Secondary | ICD-10-CM | POA: Diagnosis not present

## 2017-11-04 DIAGNOSIS — Z23 Encounter for immunization: Secondary | ICD-10-CM | POA: Diagnosis not present

## 2017-11-04 DIAGNOSIS — I10 Essential (primary) hypertension: Secondary | ICD-10-CM | POA: Diagnosis not present

## 2017-11-04 DIAGNOSIS — R531 Weakness: Secondary | ICD-10-CM | POA: Diagnosis not present

## 2017-11-08 DIAGNOSIS — E1040 Type 1 diabetes mellitus with diabetic neuropathy, unspecified: Secondary | ICD-10-CM | POA: Diagnosis not present

## 2017-11-08 DIAGNOSIS — G4733 Obstructive sleep apnea (adult) (pediatric): Secondary | ICD-10-CM | POA: Diagnosis not present

## 2017-11-08 DIAGNOSIS — M1711 Unilateral primary osteoarthritis, right knee: Secondary | ICD-10-CM | POA: Diagnosis not present

## 2017-11-08 DIAGNOSIS — M5442 Lumbago with sciatica, left side: Secondary | ICD-10-CM | POA: Diagnosis not present

## 2017-11-08 DIAGNOSIS — I10 Essential (primary) hypertension: Secondary | ICD-10-CM | POA: Diagnosis not present

## 2017-11-16 DIAGNOSIS — G4733 Obstructive sleep apnea (adult) (pediatric): Secondary | ICD-10-CM | POA: Diagnosis not present

## 2017-11-18 DIAGNOSIS — H04123 Dry eye syndrome of bilateral lacrimal glands: Secondary | ICD-10-CM | POA: Diagnosis not present

## 2017-11-18 DIAGNOSIS — H401133 Primary open-angle glaucoma, bilateral, severe stage: Secondary | ICD-10-CM | POA: Diagnosis not present

## 2017-11-20 ENCOUNTER — Ambulatory Visit
Payer: Medicare Other | Attending: Student in an Organized Health Care Education/Training Program | Admitting: Student in an Organized Health Care Education/Training Program

## 2017-11-20 ENCOUNTER — Encounter: Payer: Self-pay | Admitting: Student in an Organized Health Care Education/Training Program

## 2017-11-20 ENCOUNTER — Other Ambulatory Visit: Payer: Self-pay

## 2017-11-20 VITALS — BP 123/87 | HR 69 | Temp 97.9°F | Resp 16 | Ht 71.0 in | Wt 184.0 lb

## 2017-11-20 DIAGNOSIS — Z794 Long term (current) use of insulin: Secondary | ICD-10-CM | POA: Diagnosis not present

## 2017-11-20 DIAGNOSIS — M1711 Unilateral primary osteoarthritis, right knee: Secondary | ICD-10-CM | POA: Diagnosis not present

## 2017-11-20 DIAGNOSIS — H42 Glaucoma in diseases classified elsewhere: Secondary | ICD-10-CM | POA: Insufficient documentation

## 2017-11-20 DIAGNOSIS — E1021 Type 1 diabetes mellitus with diabetic nephropathy: Secondary | ICD-10-CM | POA: Diagnosis not present

## 2017-11-20 DIAGNOSIS — M47816 Spondylosis without myelopathy or radiculopathy, lumbar region: Secondary | ICD-10-CM | POA: Diagnosis not present

## 2017-11-20 DIAGNOSIS — Z7982 Long term (current) use of aspirin: Secondary | ICD-10-CM | POA: Diagnosis not present

## 2017-11-20 DIAGNOSIS — G4700 Insomnia, unspecified: Secondary | ICD-10-CM | POA: Diagnosis not present

## 2017-11-20 DIAGNOSIS — E1039 Type 1 diabetes mellitus with other diabetic ophthalmic complication: Secondary | ICD-10-CM | POA: Insufficient documentation

## 2017-11-20 DIAGNOSIS — Z5181 Encounter for therapeutic drug level monitoring: Secondary | ICD-10-CM | POA: Diagnosis not present

## 2017-11-20 DIAGNOSIS — M5416 Radiculopathy, lumbar region: Secondary | ICD-10-CM | POA: Diagnosis not present

## 2017-11-20 DIAGNOSIS — M5116 Intervertebral disc disorders with radiculopathy, lumbar region: Secondary | ICD-10-CM | POA: Insufficient documentation

## 2017-11-20 DIAGNOSIS — M5136 Other intervertebral disc degeneration, lumbar region: Secondary | ICD-10-CM | POA: Diagnosis not present

## 2017-11-20 DIAGNOSIS — Z79899 Other long term (current) drug therapy: Secondary | ICD-10-CM | POA: Diagnosis not present

## 2017-11-20 DIAGNOSIS — M47896 Other spondylosis, lumbar region: Secondary | ICD-10-CM | POA: Insufficient documentation

## 2017-11-20 DIAGNOSIS — E785 Hyperlipidemia, unspecified: Secondary | ICD-10-CM | POA: Insufficient documentation

## 2017-11-20 DIAGNOSIS — Z86010 Personal history of colonic polyps: Secondary | ICD-10-CM | POA: Insufficient documentation

## 2017-11-20 DIAGNOSIS — Z87442 Personal history of urinary calculi: Secondary | ICD-10-CM | POA: Insufficient documentation

## 2017-11-20 DIAGNOSIS — I10 Essential (primary) hypertension: Secondary | ICD-10-CM | POA: Diagnosis not present

## 2017-11-20 DIAGNOSIS — M9983 Other biomechanical lesions of lumbar region: Secondary | ICD-10-CM

## 2017-11-20 DIAGNOSIS — M48061 Spinal stenosis, lumbar region without neurogenic claudication: Secondary | ICD-10-CM | POA: Diagnosis not present

## 2017-11-20 DIAGNOSIS — J449 Chronic obstructive pulmonary disease, unspecified: Secondary | ICD-10-CM | POA: Insufficient documentation

## 2017-11-20 DIAGNOSIS — E1042 Type 1 diabetes mellitus with diabetic polyneuropathy: Secondary | ICD-10-CM | POA: Diagnosis not present

## 2017-11-20 DIAGNOSIS — E1010 Type 1 diabetes mellitus with ketoacidosis without coma: Secondary | ICD-10-CM | POA: Diagnosis not present

## 2017-11-20 DIAGNOSIS — F329 Major depressive disorder, single episode, unspecified: Secondary | ICD-10-CM | POA: Insufficient documentation

## 2017-11-20 NOTE — Patient Instructions (Addendum)
1. Left sided L-ESI with sedationPain Management Discharge Instructions  General Discharge Instructions :  If you need to reach your doctor call: Monday-Friday 8:00 am - 4:00 pm at [phone] or toll free [phone].  After clinic hours [phone] to have operator reach doctor.  Bring all of your medication bottles to all your appointments in the pain clinic.  To cancel or reschedule your appointment with Pain Management please remember to call 24 hours in advance to avoid a fee.  Refer to the educational materials which you have been given on: General Risks, I had my Procedure. Discharge Instructions, Post Sedation.  Post Procedure Instructions:  The drugs you were given will stay in your system until tomorrow, so for the next 24 hours you should not drive, make any legal decisions or drink any alcoholic beverages.  You may eat anything you prefer, but it is better to start with liquids then soups and crackers, and gradually work up to solid foods.  Please notify your doctor immediately if you have any unusual bleeding, trouble breathing or pain that is not related to your normal pain.  Depending on the type of procedure that was done, some parts of your body may feel week and/or numb.  This usually clears up by tonight or the next day.  Walk with the use of an assistive device or accompanied by an adult for the 24 hours.  You may use ice on the affected area for the first 24 hours.  Put ice in a Ziploc bag and cover with a towel and place against area 15 minutes on 15 minutes off.  You may switch to heat after 24 hours.GENERAL RISKS AND COMPLICATIONS  What are the risk, side effects and possible complications? Generally speaking, most procedures are safe.  However, with any procedure there are risks, side effects, and the possibility of complications.  The risks and complications are dependent upon the sites that are lesioned, or the type of nerve block to be performed.  The  closer the procedure is to the spine, the more serious the risks are.  Great care is taken when placing the radio frequency needles, block needles or lesioning probes, but sometimes complications can occur. 1. Infection: Any time there is an injection through the skin, there is a risk of infection.  This is why sterile conditions are used for these blocks.  There are four possible types of infection. 1. Localized skin infection. 2. Central Nervous System Infection-This can be in the form of Meningitis, which can be deadly. 3. Epidural Infections-This can be in the form of an epidural abscess, which can cause pressure inside of the spine, causing compression of the spinal cord with subsequent paralysis. This would require an emergency surgery to decompress, and there are no guarantees that the patient would recover from the paralysis. 4. Discitis-This is an infection of the intervertebral discs.  It occurs in about 1% of discography procedures.  It is difficult to treat and it may lead to surgery.        2. Pain: the needles have to go through skin and soft tissues, will cause soreness.       3. Damage to internal structures:  The nerves to be lesioned may be near blood vessels or    other nerves which can be potentially damaged.       4. Bleeding: Bleeding is more common if the patient is taking blood thinners such as  aspirin, Coumadin, Ticiid, Plavix, etc., or if he/she have some  genetic predisposition  such as hemophilia. Bleeding into the spinal canal can cause compression of the spinal  cord with subsequent paralysis.  This would require an emergency surgery to  decompress and there are no guarantees that the patient would recover from the  paralysis.       5. Pneumothorax:  Puncturing of a lung is a possibility, every time a needle is introduced in  the area of the chest or upper back.  Pneumothorax refers to free air around the  collapsed lung(s), inside of the thoracic cavity (chest cavity).   Another two possible  complications related to a similar event would include: Hemothorax and Chylothorax.   These are variations of the Pneumothorax, where instead of air around the collapsed  lung(s), you may have blood or chyle, respectively.       6. Spinal headaches: They may occur with any procedures in the area of the spine.       7. Persistent CSF (Cerebro-Spinal Fluid) leakage: This is a rare problem, but may occur  with prolonged intrathecal or epidural catheters either due to the formation of a fistulous  track or a dural tear.       8. Nerve damage: By working so close to the spinal cord, there is always a possibility of  nerve damage, which could be as serious as a permanent spinal cord injury with  paralysis.       9. Death:  Although rare, severe deadly allergic reactions known as "Anaphylactic  reaction" can occur to any of the medications used.      10. Worsening of the symptoms:  We can always make thing worse.  What are the chances of something like this happening? Chances of any of this occuring are extremely low.  By statistics, you have more of a chance of getting killed in a motor vehicle accident: while driving to the hospital than any of the above occurring .  Nevertheless, you should be aware that they are possibilities.  In general, it is similar to taking a shower.  Everybody knows that you can slip, hit your head and get killed.  Does that mean that you should not shower again?  Nevertheless always keep in mind that statistics do not mean anything if you happen to be on the wrong side of them.  Even if a procedure has a 1 (one) in a 1,000,000 (million) chance of going wrong, it you happen to be that one..Also, keep in mind that by statistics, you have more of a chance of having something go wrong when taking medications.  Who should not have this procedure? If you are on a blood thinning medication (e.g. Coumadin, Plavix, see list of "Blood Thinners"), or if you have an active  infection going on, you should not have the procedure.  If you are taking any blood thinners, please inform your physician.  How should I prepare for this procedure?  Do not eat or drink anything at least six hours prior to the procedure.  Bring a driver with you .  It cannot be a taxi.  Come accompanied by an adult that can drive you back, and that is strong enough to help you if your legs get weak or numb from the local anesthetic.  Take all of your medicines the morning of the procedure with just enough water to swallow them.  If you have diabetes, make sure that you are scheduled to have your procedure done first thing in the morning, whenever possible.  If you have diabetes, take only half of your insulin dose and notify our nurse that you have done so as soon as you arrive at the clinic.  If you are diabetic, but only take blood sugar pills (oral hypoglycemic), then do not take them on the morning of your procedure.  You may take them after you have had the procedure.  Do not take aspirin or any aspirin-containing medications, at least eleven (11) days prior to the procedure.  They may prolong bleeding.  Wear loose fitting clothing that may be easy to take off and that you would not mind if it got stained with Betadine or blood.  Do not wear any jewelry or perfume  Remove any nail coloring.  It will interfere with some of our monitoring equipment.  NOTE: Remember that this is not meant to be interpreted as a complete list of all possible complications.  Unforeseen problems may occur.  BLOOD THINNERS The following drugs contain aspirin or other products, which can cause increased bleeding during surgery and should not be taken for 2 weeks prior to and 1 week after surgery.  If you should need take something for relief of minor pain, you may take acetaminophen which is found in Tylenol,m Datril, Anacin-3 and Panadol. It is not blood thinner. The products listed below are.  Do not  take any of the products listed below in addition to any listed on your instruction sheet.  A.P.C or A.P.C with Codeine Codeine Phosphate Capsules #3 Ibuprofen Ridaura  ABC compound Congesprin Imuran rimadil  Advil Cope Indocin Robaxisal  Alka-Seltzer Effervescent Pain Reliever and Antacid Coricidin or Coricidin-D  Indomethacin Rufen  Alka-Seltzer plus Cold Medicine Cosprin Ketoprofen S-A-C Tablets  Anacin Analgesic Tablets or Capsules Coumadin Korlgesic Salflex  Anacin Extra Strength Analgesic tablets or capsules CP-2 Tablets Lanoril Salicylate  Anaprox Cuprimine Capsules Levenox Salocol  Anexsia-D Dalteparin Magan Salsalate  Anodynos Darvon compound Magnesium Salicylate Sine-off  Ansaid Dasin Capsules Magsal Sodium Salicylate  Anturane Depen Capsules Marnal Soma  APF Arthritis pain formula Dewitt's Pills Measurin Stanback  Argesic Dia-Gesic Meclofenamic Sulfinpyrazone  Arthritis Bayer Timed Release Aspirin Diclofenac Meclomen Sulindac  Arthritis pain formula Anacin Dicumarol Medipren Supac  Analgesic (Safety coated) Arthralgen Diffunasal Mefanamic Suprofen  Arthritis Strength Bufferin Dihydrocodeine Mepro Compound Suprol  Arthropan liquid Dopirydamole Methcarbomol with Aspirin Synalgos  ASA tablets/Enseals Disalcid Micrainin Tagament  Ascriptin Doan's Midol Talwin  Ascriptin A/D Dolene Mobidin Tanderil  Ascriptin Extra Strength Dolobid Moblgesic Ticlid  Ascriptin with Codeine Doloprin or Doloprin with Codeine Momentum Tolectin  Asperbuf Duoprin Mono-gesic Trendar  Aspergum Duradyne Motrin or Motrin IB Triminicin  Aspirin plain, buffered or enteric coated Durasal Myochrisine Trigesic  Aspirin Suppositories Easprin Nalfon Trillsate  Aspirin with Codeine Ecotrin Regular or Extra Strength Naprosyn Uracel  Atromid-S Efficin Naproxen Ursinus  Auranofin Capsules Elmiron Neocylate Vanquish  Axotal Emagrin Norgesic Verin  Azathioprine Empirin or Empirin with Codeine Normiflo Vitamin E   Azolid Emprazil Nuprin Voltaren  Bayer Aspirin plain, buffered or children's or timed BC Tablets or powders Encaprin Orgaran Warfarin Sodium  Buff-a-Comp Enoxaparin Orudis Zorpin  Buff-a-Comp with Codeine Equegesic Os-Cal-Gesic   Buffaprin Excedrin plain, buffered or Extra Strength Oxalid   Bufferin Arthritis Strength Feldene Oxphenbutazone   Bufferin plain or Extra Strength Feldene Capsules Oxycodone with Aspirin   Bufferin with Codeine Fenoprofen Fenoprofen Pabalate or Pabalate-SF   Buffets II Flogesic Panagesic   Buffinol plain or Extra Strength Florinal or Florinal with Codeine Panwarfarin   Buf-Tabs Flurbiprofen Penicillamine   Butalbital  Compound Four-way cold tablets Penicillin   Butazolidin Fragmin Pepto-Bismol   Carbenicillin Geminisyn Percodan   Carna Arthritis Reliever Geopen Persantine   Carprofen Gold's salt Persistin   Chloramphenicol Goody's Phenylbutazone   Chloromycetin Haltrain Piroxlcam   Clmetidine heparin Plaquenil   Cllnoril Hyco-pap Ponstel   Clofibrate Hydroxy chloroquine Propoxyphen         Before stopping any of these medications, be sure to consult the physician who ordered them.  Some, such as Coumadin (Warfarin) are ordered to prevent or treat serious conditions such as "deep thrombosis", "pumonary embolisms", and other heart problems.  The amount of time that you may need off of the medication may also vary with the medication and the reason for which you were taking it.  If you are taking any of these medications, please make sure you notify your pain physician before you undergo any procedures.         Epidural Steroid Injection Patient Information  Description: The epidural space surrounds the nerves as they exit the spinal cord.  In some patients, the nerves can be compressed and inflamed by a bulging disc or a tight spinal canal (spinal stenosis).  By injecting steroids into the epidural space, we can bring irritated nerves into direct contact  with a potentially helpful medication.  These steroids act directly on the irritated nerves and can reduce swelling and inflammation which often leads to decreased pain.  Epidural steroids may be injected anywhere along the spine and from the neck to the low back depending upon the location of your pain.   After numbing the skin with local anesthetic (like Novocaine), a small needle is passed into the epidural space slowly.  You may experience a sensation of pressure while this is being done.  The entire block usually last less than 10 minutes.  Conditions which may be treated by epidural steroids:   Low back and leg pain  Neck and arm pain  Spinal stenosis  Post-laminectomy syndrome  Herpes zoster (shingles) pain  Pain from compression fractures  Preparation for the injection:  1. Do not eat any solid food or dairy products within 8 hours of your appointment.  2. You may drink clear liquids up to 3 hours before appointment.  Clear liquids include water, black coffee, juice or soda.  No milk or cream please. 3. You may take your regular medication, including pain medications, with a sip of water before your appointment  Diabetics should hold regular insulin (if taken separately) and take 1/2 normal NPH dos the morning of the procedure.  Carry some sugar containing items with you to your appointment. 4. A driver must accompany you and be prepared to drive you home after your procedure.  5. Bring all your current medications with your. 6. An IV may be inserted and sedation may be given at the discretion of the physician.   7. A blood pressure cuff, EKG and other monitors will often be applied during the procedure.  Some patients may need to have extra oxygen administered for a short period. 8. You will be asked to provide medical information, including your allergies, prior to the procedure.  We must know immediately if you are taking blood thinners (like Coumadin/Warfarin)  Or if you are  allergic to IV iodine contrast (dye). We must know if you could possible be pregnant.  Possible side-effects:  Bleeding from needle site  Infection (rare, may require surgery)  Nerve injury (rare)  Numbness & tingling (temporary)  Difficulty urinating (rare,  temporary)  Spinal headache ( a headache worse with upright posture)  Light -headedness (temporary)  Pain at injection site (several days)  Decreased blood pressure (temporary)  Weakness in arm/leg (temporary)  Pressure sensation in back/neck (temporary)  Call if you experience:  Fever/chills associated with headache or increased back/neck pain.  Headache worsened by an upright position.  New onset weakness or numbness of an extremity below the injection site  Hives or difficulty breathing (go to the emergency room)  Inflammation or drainage at the infection site  Severe back/neck pain  Any new symptoms which are concerning to you  Please note:  Although the local anesthetic injected can often make your back or neck feel good for several hours after the injection, the pain will likely return.  It takes 3-7 days for steroids to work in the epidural space.  You may not notice any pain relief for at least that one week.  If effective, we will often do a series of three injections spaced 3-6 weeks apart to maximally decrease your pain.  After the initial series, we generally will wait several months before considering a repeat injection of the same type.  If you have any questions, please call [phone] [facility]

## 2017-11-20 NOTE — Progress Notes (Signed)
Patient's Name: Dylan Quinn  MRN: [id number]  Referring Provider: Tracie Harrier, MD  DOB: 1936/09/21  PCP: Tracie Harrier, MD  DOS: 11/20/2017  Note by: Gillis Santa, MD  Service setting: Ambulatory outpatient  Specialty: Interventional Pain Management  Location: [facility] (AMB) Pain Management Facility    Patient type: Established   Primary Reason(s) for Visit: Encounter for prescription drug management. (Level of risk: moderate)  CC: Back Pain (left, lower)  HPI  Dylan Quinn is a 81 y.o. year old, male patient, who comes today for a medication management evaluation. 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 ([place]); PERIPHERAL NEUROPATHY; Glaucoma; HEARING LOSS; Essential hypertension; Prostate cancer ([place]); Other osteoporosis; PROTEINURIA, MILD; Insomnia; Normocytic anemia; URI (upper respiratory infection); Type 1 diabetes mellitus with nephropathy ([place]); COPD (chronic obstructive pulmonary disease) ([place]); Right knee pain; Primary osteoarthritis of right knee; Diabetic ketoacidosis without coma associated with type 1 diabetes mellitus ([place]); Nausea & vomiting; DKA, type 1 ([place]); Influenza with pneumonia; Staphylococcus aureus bacteremia; Rash and nonspecific skin eruption; Lumbar radiculopathy; Lumbar degenerative disc disease; and Foraminal stenosis of lumbar region on their problem list. His primarily concern today is the Back Pain (left, lower)  Pain Assessment: Location: Left, Lower Back Radiating: left upper leg Onset: More than a month ago Duration: Chronic pain Quality: (piercing) Severity: 3 /10 (self-reported pain score)  Note: Reported level is compatible with observation.                         When using our objective Pain Scale, levels between 6 and 10/10 are said to belong in an emergency room, as it progressively worsens from a 6/10, described as severely limiting, requiring emergency care not usually available at an outpatient pain management  facility. At a 6/10 level, communication becomes difficult and requires great effort. Assistance to reach the emergency department may be required. Facial flushing and profuse sweating along with potentially dangerous increases in heart rate and blood pressure will be evident. Effect on ADL:   Timing: Constant Modifying factors: rest  Dylan Quinn was last scheduled for an appointment on 10/17/2017 for medication management. During today's appointment we reviewed Dylan Quinn chronic pain status, as well as his outpatient medication regimen.  Since our last appointment, patient has been seen by infectious disease, Dr. Ola Spurr and concern for active lumbar infection is low.  At this point, we have discussed the potential risks and benefits of performing a lumbar epidural steroid injection in the context of his type 1 diabetes and patient would like to proceed.  The patient  reports that he does not use drugs. His body mass index is 25.66 kg/m.  Further details on both, my assessment(s), as well as the proposed treatment plan, please see below.   Recent Diagnostic Imaging Results  CT BIOPSY INDICATION: Back pain and lower leg pain with abnormalities at L4-5 on recent MRI. Request is been made for disc aspiration to rule out discitis.  EXAM: CT-GUIDED ASPIRATION OF THE L4-5 DISC WITH SUBSEQUENT BIOPSY OF THE DISC SPACE.  MEDICATIONS: None.  ANESTHESIA/SEDATION: None  FLUOROSCOPY TIME:  None  COMPLICATIONS: None immediate.  PROCEDURE: Informed written consent was obtained from the patient after a thorough discussion of the procedural risks, benefits and alternatives. All questions were addressed. Maximal Sterile Barrier Technique was utilized including caps, mask, sterile gowns, sterile gloves, sterile drape, hand hygiene and skin antiseptic. A timeout was performed prior to the initiation of the procedure.  Utilizing CT fluoroscopic  guidance and 1% xylocaine as  local anesthetic a a 19 gauge guiding needle was placed percutaneously into the disc space at L4-5. Aspiration was performed an yielded approximately 2-3 mL of a a mildly bloody fluid. This was sent for laboratory evaluation. Additionally a single 20 gauge core biopsy of the disc space was obtained and sent along with the aspirated fluid for cultures. The guiding needle was then removed. The patient tolerated procedure well and was returned his room in satisfactory condition.  IMPRESSION: Successful CT-guided aspiration of the L4-5 disc space with subsequent 20 gauge biopsy of the disc space for laboratory evaluation to rule out discitis.  Electronically Signed   By: Inez Catalina M.D.   On: 09/18/2017 15:41  Complexity Note: Imaging results reviewed. Results shared with Dylan Quinn, using Layman's terms.                         Meds   Current Outpatient Medications:  .  aspirin (GOODSENSE ASPIRIN) 81 MG chewable tablet, Chew 81 mg by mouth daily. , Disp: , Rfl:  .  brimonidine-timolol (COMBIGAN) 0.2-0.5 % ophthalmic solution, Place 1 drop into both eyes 2 (two) times daily. , Disp: , Rfl:  .  clobetasol cream (TEMOVATE) 1.61 %, Apply 1 application topically 2 (two) times daily as needed (for rash). , Disp: , Rfl:  .  Cyanocobalamin (B-12 IJ), Inject 1 mcg as directed every 30 (thirty) days., Disp: , Rfl:  .  Docusate Sodium (COLACE PO), Take 2 capsules by mouth as needed., Disp: , Rfl:  .  dorzolamide (TRUSOPT) 2 % ophthalmic solution, Place 1 drop into both eyes 2 (two) times daily., Disp: , Rfl:  .  hydrochlorothiazide (MICROZIDE) 12.5 MG capsule, Take 1 capsule by mouth daily., Disp: , Rfl:  .  HYDROcodone-acetaminophen (NORCO/VICODIN) 5-325 MG tablet, Take 1 tablet by mouth every 6 (six) hours as needed for moderate pain., Disp: , Rfl:  .  Insulin Glargine (LANTUS SOLOSTAR) 100 UNIT/ML Solostar Pen, Inject 11 Units into the skin daily. , Disp: , Rfl:  .  insulin lispro (HUMALOG)  100 UNIT/ML injection, Inject 0-10 Units into the skin 3 (three) times daily before meals. + sliding scale if BG>150, Disp: , Rfl:  .  latanoprost (XALATAN) 0.005 % ophthalmic solution, Place 1 drop into both eyes at bedtime., Disp: , Rfl:  .  levocetirizine (XYZAL) 5 MG tablet, Take 1 tablet by mouth daily. In evening, Disp: , Rfl:  .  meloxicam (MOBIC) 7.5 MG tablet, Take 7.5 mg by mouth daily., Disp: , Rfl:  .  olmesartan (BENICAR) 40 MG tablet, Take 1 tablet (40 mg total) by mouth daily., Disp: 90 tablet, Rfl: 3 .  potassium chloride SA (K-DUR,KLOR-CON) 20 MEQ tablet, Take 2 tablets (40 mEq total) by mouth daily., Disp: 30 tablet, Rfl: 2 .  pregabalin (LYRICA) 100 MG capsule, Take 1 capsule (100 mg total) by mouth 2 (two) times daily., Disp: 180 capsule, Rfl: 1 .  colchicine 0.6 MG tablet, Take 2 tablets by mouth. 2 tablets at first sign of gout flare followed by 1, Disp: , Rfl:  .  glucagon (GLUCAGON EMERGENCY) 1 MG injection, Inject 1 mg into the muscle once as needed. (Patient not taking: Reported on 10/17/2017), Disp: 2 each, Rfl: 1 .  polyethylene glycol (MIRALAX / GLYCOLAX) packet, Take 17 g by mouth daily. (Patient not taking: Reported on 11/20/2017), Disp: 14 each, Rfl: 0 .  traMADol (ULTRAM) 50 MG tablet, Take 1  tablet (50 mg total) by mouth every 6 (six) hours as needed for moderate pain or severe pain. (Patient not taking: Reported on 10/17/2017), Disp: 30 tablet, Rfl: 0  ROS  Constitutional: Denies any fever or chills Gastrointestinal: No reported hemesis, hematochezia, vomiting, or acute GI distress Musculoskeletal: Denies any acute onset joint swelling, redness, loss of ROM, or weakness Neurological: No reported episodes of acute onset apraxia, aphasia, dysarthria, agnosia, amnesia, paralysis, loss of coordination, or loss of consciousness  Allergies  Mr. Bouillon is allergic to lisinopril.  [place]  Drug: Mr. Stclair  reports that he does not use drugs. Alcohol:  reports that he  does not drink alcohol. Tobacco:  reports that  has never smoked. he has never used smokeless tobacco. Medical:  has a past medical history of BENIGN PROSTATIC HYPERTROPHY (07/19/2009), COLONIC POLYPS (02/14/2010), DEPRESSION (03/16/2008), DIABETES MELLITUS, TYPE I (06/30/2007), Dyslipidemia, FOLLICULITIS (7/0/9628), GLAUCOMA (07/19/2009), HEARING LOSS (02/14/2010), HYPERTENSION (05/14/2008), HYPOGONADISM, MALE (12/16/2007), Leukopenia, LUMBAR RADICULOPATHY, LEFT (02/23/2010), Other osteoporosis (12/16/2007), PERIPHERAL NEUROPATHY (06/30/2007), Prostate cancer ([place]), PROTEINURIA, MILD (02/14/2010), Psoriasis, and URINARY CALCULUS (12/27/2010). Surgical: Mr. Relph  has a past surgical history that includes Cataract extraction (2006); Cataract extraction (1997); Appendectomy (1986); and TEE without cardioversion (N/A, 12/19/2016). Family: family history includes Cancer in his father; Heart disease in his father; High blood pressure in his unknown relative.  Constitutional Exam  General appearance: Well nourished, well developed, and well hydrated. In no apparent acute distress Vitals:   11/20/17 1432  BP: 123/87  Pulse: 69  Resp: 16  Temp: 97.9 F (36.6 C)  TempSrc: Oral  SpO2: 99%  Weight: 184 lb (83.5 kg)  Height: 5\' 11"  (1.803 m)   BMI Assessment: Estimated body mass index is 25.66 kg/m as calculated from the following:   Height as of this encounter: 5\' 11"  (1.803 m).   Weight as of this encounter: 184 lb (83.5 kg).  BMI interpretation table: BMI level Category Range association with higher incidence of chronic pain  <18 kg/m2 Underweight   18.5-24.9 kg/m2 Ideal body weight   25-29.9 kg/m2 Overweight Increased incidence by 20%  30-34.9 kg/m2 Obese (Class I) Increased incidence by 68%  35-39.9 kg/m2 Severe obesity (Class II) Increased incidence by 136%  >40 kg/m2 Extreme obesity (Class III) Increased incidence by 254%   BMI Readings from Last 4 Encounters:  11/20/17 25.66 kg/m  10/17/17 24.55  kg/m  09/18/17 26.23 kg/m  04/04/17 26.23 kg/m   Wt Readings from Last 4 Encounters:  11/20/17 184 lb (83.5 kg)  10/17/17 176 lb (79.8 kg)  09/18/17 196 lb 1.6 oz (89 kg)  04/04/17 196 lb 1.6 oz (89 kg)  Psych/Mental status: Alert, oriented x 3 (person, place, & time)       Eyes: PERLA Respiratory: No evidence of acute respiratory distress  Cervical Spine Area Exam  Skin & Axial Inspection: No masses, redness, edema, swelling, or associated skin lesions Alignment: Symmetrical Functional ROM: Unrestricted ROM      Stability: No instability detected Muscle Tone/Strength: Functionally intact. No obvious neuro-muscular anomalies detected. Sensory (Neurological): Unimpaired Palpation: No palpable anomalies              Upper Extremity (UE) Exam    Side: Right upper extremity  Side: Left upper extremity  Skin & Extremity Inspection: Skin color, temperature, and hair growth are WNL. No peripheral edema or cyanosis. No masses, redness, swelling, asymmetry, or associated skin lesions. No contractures.  Skin & Extremity Inspection: Skin color, temperature, and hair growth are WNL. No  peripheral edema or cyanosis. No masses, redness, swelling, asymmetry, or associated skin lesions. No contractures.  Functional ROM: Unrestricted ROM          Functional ROM: Unrestricted ROM          Muscle Tone/Strength: Functionally intact. No obvious neuro-muscular anomalies detected.  Muscle Tone/Strength: Functionally intact. No obvious neuro-muscular anomalies detected.  Sensory (Neurological): Unimpaired          Sensory (Neurological): Unimpaired          Palpation: No palpable anomalies              Palpation: No palpable anomalies              Specialized Test(s): Deferred         Specialized Test(s): Deferred          Thoracic Spine Area Exam  Skin & Axial Inspection: No masses, redness, or swelling Alignment: Symmetrical Functional ROM: Unrestricted ROM Stability: No instability detected Muscle  Tone/Strength: Functionally intact. No obvious neuro-muscular anomalies detected. Sensory (Neurological): Unimpaired Muscle strength & Tone: No palpable anomalies Lumbar Spine Area Exam  Skin & Axial Inspection: Paravertebral muscle atrophy Alignment: Asymmetric Functional ROM: Decreased ROM      Stability: No instability detected Muscle Tone/Strength: Functionally intact. No obvious neuro-muscular anomalies detected. Sensory (Neurological): Movement-associated pain Palpation: Complains of area being tender to palpation Bilateral Fist Percussion Test Provocative Tests: Lumbar Hyperextension and rotation test: Positive bilaterally for facet joint pain. Lumbar Lateral bending test: Positive due to pain. Patrick's Maneuver: evaluation deferred today                    Gait & Posture Assessment  Ambulation: Limited Gait: Antalgic Posture: Difficulty standing up straight, due to pain   Lower Extremity Exam    Side: Right lower extremity  Side: Left lower extremity  Skin & Extremity Inspection: Skin color, temperature, and hair growth are WNL. No peripheral edema or cyanosis. No masses, redness, swelling, asymmetry, or associated skin lesions. No contractures.  Skin & Extremity Inspection: Skin color, temperature, and hair growth are WNL. No peripheral edema or cyanosis. No masses, redness, swelling, asymmetry, or associated skin lesions. No contractures.  Functional ROM: Unrestricted ROM          Functional ROM: Unrestricted ROM          Muscle Tone/Strength: Functionally intact. No obvious neuro-muscular anomalies detected.  Muscle Tone/Strength: Functionally intact. No obvious neuro-muscular anomalies detected.  Sensory (Neurological): Unimpaired  Sensory (Neurological): Unimpaired  Palpation: No palpable anomalies  Palpation: No palpable anomalies     Assessment  Primary Diagnosis & Pertinent Problem List: The primary encounter diagnosis was Lumbar radiculopathy. Diagnoses of  Lumbar degenerative disc disease, Foraminal stenosis of lumbar region, and Lumbar spondylosis were also pertinent to this visit.  Status Diagnosis  Persistent Persistent Persistent 1. Lumbar radiculopathy   2. Lumbar degenerative disc disease   3. Foraminal stenosis of lumbar region   4. Lumbar spondylosis      81 year old male with a history of type 1 diabetes who presents with axial low back pain with radiation to bilateral lower extremities that has worsened over the last 2-4 months. Low back pain with radiation to his legs could be secondary to multilevel lumbar spondylosis, facet arthropathy, subarticular stenosis most pronounced at L4-L5 with mild disc bulges and mild lateral recess stenosis at L2-L3 and L3-L4.  Patient's lumbar MRI from 09/17/2017 is negative for epidural abscess in his CT-guided biopsy was negative for discitis.  The patient has poorly controlled type 1 diabetes thereby increasing his risk of infection with any neuraxial procedure.   Patient has seen infectious disease physician, Dr. Ola Spurr and according to his evaluation, very low chance of an active infection in the patient's lumbar paraspinal muscles or epidural space.  I had an extensive discussion with the patient about his increased risk of infection with a lumbar epidural steroid injection along with worsening blood glucose control as a result of steroid side effect.  I did counsel him on the fact that this risk increases with cumulative steroid exposure and that we would be mindful of his total steroid exposure if his radicular symptoms are responsive to this epidural steroid injection.  All questions and concerns were addressed and discussed with the patient and his wife.  We will proceed with left-sided epidural steroid injection at L5-S1 with sedation.  Plan: -Left L5-S1 ESI with sedation.  Lab-work, procedure(s), and/or referral(s): Orders Placed This Encounter  Procedures  . Lumbar Epidural Injection     Provider-requested follow-up: Return in 5 days (on 11/25/2017) for Procedure.  Future Appointments  Date Time Provider [place]  11/25/2017  8:00 AM Gillis Santa, MD [facility] None    Primary Care Physician: Tracie Harrier, MD Location: [facility] Outpatient Pain Management Facility Note by: Gillis Santa, M.D Date: 11/20/2017; Time: 3:40 PM  Patient Instructions   1. Left sided L-ESI with sedationPain Management Discharge Instructions  General Discharge Instructions :  If you need to reach your doctor call: Monday-Friday 8:00 am - 4:00 pm at [phone] or toll free [phone].  After clinic hours [phone] to have operator reach doctor.  Bring all of your medication bottles to all your appointments in the pain clinic.  To cancel or reschedule your appointment with Pain Management please remember to call 24 hours in advance to avoid a fee.  Refer to the educational materials which you have been given on: General Risks, I had my Procedure. Discharge Instructions, Post Sedation.  Post Procedure Instructions:  The drugs you were given will stay in your system until tomorrow, so for the next 24 hours you should not drive, make any legal decisions or drink any alcoholic beverages.  You may eat anything you prefer, but it is better to start with liquids then soups and crackers, and gradually work up to solid foods.  Please notify your doctor immediately if you have any unusual bleeding, trouble breathing or pain that is not related to your normal pain.  Depending on the type of procedure that was done, some parts of your body may feel week and/or numb.  This usually clears up by tonight or the next day.  Walk with the use of an assistive device or accompanied by an adult for the 24 hours.  You may use ice on the affected area for the first 24 hours.  Put ice in a Ziploc bag and cover with a towel and place against area 15 minutes on 15 minutes off.  You may switch  to heat after 24 hours.GENERAL RISKS AND COMPLICATIONS  What are the risk, side effects and possible complications? Generally speaking, most procedures are safe.  However, with any procedure there are risks, side effects, and the possibility of complications.  The risks and complications are dependent upon the sites that are lesioned, or the type of nerve block to be performed.  The closer the procedure is to the spine, the more serious the risks are.  Great care is taken when placing the radio  frequency needles, block needles or lesioning probes, but sometimes complications can occur. 1. Infection: Any time there is an injection through the skin, there is a risk of infection.  This is why sterile conditions are used for these blocks.  There are four possible types of infection. 1. Localized skin infection. 2. Central Nervous System Infection-This can be in the form of Meningitis, which can be deadly. 3. Epidural Infections-This can be in the form of an epidural abscess, which can cause pressure inside of the spine, causing compression of the spinal cord with subsequent paralysis. This would require an emergency surgery to decompress, and there are no guarantees that the patient would recover from the paralysis. 4. Discitis-This is an infection of the intervertebral discs.  It occurs in about 1% of discography procedures.  It is difficult to treat and it may lead to surgery.        2. Pain: the needles have to go through skin and soft tissues, will cause soreness.       3. Damage to internal structures:  The nerves to be lesioned may be near blood vessels or    other nerves which can be potentially damaged.       4. Bleeding: Bleeding is more common if the patient is taking blood thinners such as  aspirin, Coumadin, Ticiid, Plavix, etc., or if he/she have some genetic predisposition  such as hemophilia. Bleeding into the spinal canal can cause compression of the spinal  cord with subsequent paralysis.   This would require an emergency surgery to  decompress and there are no guarantees that the patient would recover from the  paralysis.       5. Pneumothorax:  Puncturing of a lung is a possibility, every time a needle is introduced in  the area of the chest or upper back.  Pneumothorax refers to free air around the  collapsed lung(s), inside of the thoracic cavity (chest cavity).  Another two possible  complications related to a similar event would include: Hemothorax and Chylothorax.   These are variations of the Pneumothorax, where instead of air around the collapsed  lung(s), you may have blood or chyle, respectively.       6. Spinal headaches: They may occur with any procedures in the area of the spine.       7. Persistent CSF (Cerebro-Spinal Fluid) leakage: This is a rare problem, but may occur  with prolonged intrathecal or epidural catheters either due to the formation of a fistulous  track or a dural tear.       8. Nerve damage: By working so close to the spinal cord, there is always a possibility of  nerve damage, which could be as serious as a permanent spinal cord injury with  paralysis.       9. Death:  Although rare, severe deadly allergic reactions known as "Anaphylactic  reaction" can occur to any of the medications used.      10. Worsening of the symptoms:  We can always make thing worse.  What are the chances of something like this happening? Chances of any of this occuring are extremely low.  By statistics, you have more of a chance of getting killed in a motor vehicle accident: while driving to the hospital than any of the above occurring .  Nevertheless, you should be aware that they are possibilities.  In general, it is similar to taking a shower.  Everybody knows that you can slip, hit your head and get killed.  Does that mean that you should not shower again?  Nevertheless always keep in mind that statistics do not mean anything if you happen to be on the wrong side of them.  Even if  a procedure has a 1 (one) in a 1,000,000 (million) chance of going wrong, it you happen to be that one..Also, keep in mind that by statistics, you have more of a chance of having something go wrong when taking medications.  Who should not have this procedure? If you are on a blood thinning medication (e.g. Coumadin, Plavix, see list of "Blood Thinners"), or if you have an active infection going on, you should not have the procedure.  If you are taking any blood thinners, please inform your physician.  How should I prepare for this procedure?  Do not eat or drink anything at least six hours prior to the procedure.  Bring a driver with you .  It cannot be a taxi.  Come accompanied by an adult that can drive you back, and that is strong enough to help you if your legs get weak or numb from the local anesthetic.  Take all of your medicines the morning of the procedure with just enough water to swallow them.  If you have diabetes, make sure that you are scheduled to have your procedure done first thing in the morning, whenever possible.  If you have diabetes, take only half of your insulin dose and notify our nurse that you have done so as soon as you arrive at the clinic.  If you are diabetic, but only take blood sugar pills (oral hypoglycemic), then do not take them on the morning of your procedure.  You may take them after you have had the procedure.  Do not take aspirin or any aspirin-containing medications, at least eleven (11) days prior to the procedure.  They may prolong bleeding.  Wear loose fitting clothing that may be easy to take off and that you would not mind if it got stained with Betadine or blood.  Do not wear any jewelry or perfume  Remove any nail coloring.  It will interfere with some of our monitoring equipment.  NOTE: Remember that this is not meant to be interpreted as a complete list of all possible complications.  Unforeseen problems may occur.  BLOOD THINNERS The  following drugs contain aspirin or other products, which can cause increased bleeding during surgery and should not be taken for 2 weeks prior to and 1 week after surgery.  If you should need take something for relief of minor pain, you may take acetaminophen which is found in Tylenol,m Datril, Anacin-3 and Panadol. It is not blood thinner. The products listed below are.  Do not take any of the products listed below in addition to any listed on your instruction sheet.  A.P.C or A.P.C with Codeine Codeine Phosphate Capsules #3 Ibuprofen Ridaura  ABC compound Congesprin Imuran rimadil  Advil Cope Indocin Robaxisal  Alka-Seltzer Effervescent Pain Reliever and Antacid Coricidin or Coricidin-D  Indomethacin Rufen  Alka-Seltzer plus Cold Medicine Cosprin Ketoprofen S-A-C Tablets  Anacin Analgesic Tablets or Capsules Coumadin Korlgesic Salflex  Anacin Extra Strength Analgesic tablets or capsules CP-2 Tablets Lanoril Salicylate  Anaprox Cuprimine Capsules Levenox Salocol  Anexsia-D Dalteparin Magan Salsalate  Anodynos Darvon compound Magnesium Salicylate Quinn-off  Ansaid Dasin Capsules Magsal Sodium Salicylate  Anturane Depen Capsules Marnal Soma  APF Arthritis pain formula Dewitt's Pills Measurin Stanback  Argesic Dia-Gesic Meclofenamic Sulfinpyrazone  Arthritis Bayer Timed Release Aspirin Diclofenac  Meclomen Sulindac  Arthritis pain formula Anacin Dicumarol Medipren Supac  Analgesic (Safety coated) Arthralgen Diffunasal Mefanamic Suprofen  Arthritis Strength Bufferin Dihydrocodeine Mepro Compound Suprol  Arthropan liquid Dopirydamole Methcarbomol with Aspirin Synalgos  ASA tablets/Enseals Disalcid Micrainin Tagament  Ascriptin Doan's Midol Talwin  Ascriptin A/D Dolene Mobidin Tanderil  Ascriptin Extra Strength Dolobid Moblgesic Ticlid  Ascriptin with Codeine Doloprin or Doloprin with Codeine Momentum Tolectin  Asperbuf Duoprin Mono-gesic Trendar  Aspergum Duradyne Motrin or Motrin IB Triminicin   Aspirin plain, buffered or enteric coated Durasal Myochrisine Trigesic  Aspirin Suppositories Easprin Nalfon Trillsate  Aspirin with Codeine Ecotrin Regular or Extra Strength Naprosyn Uracel  Atromid-S Efficin Naproxen Ursinus  Auranofin Capsules Elmiron Neocylate Vanquish  Axotal Emagrin Norgesic Verin  Azathioprine Empirin or Empirin with Codeine Normiflo Vitamin E  Azolid Emprazil Nuprin Voltaren  Bayer Aspirin plain, buffered or children's or timed BC Tablets or powders Encaprin Orgaran Warfarin Sodium  Buff-a-Comp Enoxaparin Orudis Zorpin  Buff-a-Comp with Codeine Equegesic Os-Cal-Gesic   Buffaprin Excedrin plain, buffered or Extra Strength Oxalid   Bufferin Arthritis Strength Feldene Oxphenbutazone   Bufferin plain or Extra Strength Feldene Capsules Oxycodone with Aspirin   Bufferin with Codeine Fenoprofen Fenoprofen Pabalate or Pabalate-SF   Buffets II Flogesic Panagesic   Buffinol plain or Extra Strength Florinal or Florinal with Codeine Panwarfarin   Buf-Tabs Flurbiprofen Penicillamine   Butalbital Compound Four-way cold tablets Penicillin   Butazolidin Fragmin Pepto-Bismol   Carbenicillin Geminisyn Percodan   Carna Arthritis Reliever Geopen Persantine   Carprofen Gold's salt Persistin   Chloramphenicol Goody's Phenylbutazone   Chloromycetin Haltrain Piroxlcam   Clmetidine heparin Plaquenil   Cllnoril Hyco-pap Ponstel   Clofibrate Hydroxy chloroquine Propoxyphen         Before stopping any of these medications, be sure to consult the physician who ordered them.  Some, such as Coumadin (Warfarin) are ordered to prevent or treat serious conditions such as "deep thrombosis", "pumonary embolisms", and other heart problems.  The amount of time that you may need off of the medication may also vary with the medication and the reason for which you were taking it.  If you are taking any of these medications, please make sure you notify your pain physician before you undergo any  procedures.         Epidural Steroid Injection Patient Information  Description: The epidural space surrounds the nerves as they exit the spinal cord.  In some patients, the nerves can be compressed and inflamed by a bulging disc or a tight spinal canal (spinal stenosis).  By injecting steroids into the epidural space, we can bring irritated nerves into direct contact with a potentially helpful medication.  These steroids act directly on the irritated nerves and can reduce swelling and inflammation which often leads to decreased pain.  Epidural steroids may be injected anywhere along the spine and from the neck to the low back depending upon the location of your pain.   After numbing the skin with local anesthetic (like Novocaine), a small needle is passed into the epidural space slowly.  You may experience a sensation of pressure while this is being done.  The entire block usually last less than 10 minutes.  Conditions which may be treated by epidural steroids:   Low back and leg pain  Neck and arm pain  Spinal stenosis  Post-laminectomy syndrome  Herpes zoster (shingles) pain  Pain from compression fractures  Preparation for the injection:  1. Do not eat any solid food or dairy products  within 8 hours of your appointment.  2. You may drink clear liquids up to 3 hours before appointment.  Clear liquids include water, black coffee, juice or soda.  No milk or cream please. 3. You may take your regular medication, including pain medications, with a sip of water before your appointment  Diabetics should hold regular insulin (if taken separately) and take 1/2 normal NPH dos the morning of the procedure.  Carry some sugar containing items with you to your appointment. 4. A driver must accompany you and be prepared to drive you home after your procedure.  5. Bring all your current medications with your. 6. An IV may be inserted and sedation may be given at the discretion of the  physician.   7. A blood pressure cuff, EKG and other monitors will often be applied during the procedure.  Some patients may need to have extra oxygen administered for a short period. 8. You will be asked to provide medical information, including your allergies, prior to the procedure.  We must know immediately if you are taking blood thinners (like Coumadin/Warfarin)  Or if you are allergic to IV iodine contrast (dye). We must know if you could possible be pregnant.  Possible side-effects:  Bleeding from needle site  Infection (rare, may require surgery)  Nerve injury (rare)  Numbness & tingling (temporary)  Difficulty urinating (rare, temporary)  Spinal headache ( a headache worse with upright posture)  Light -headedness (temporary)  Pain at injection site (several days)  Decreased blood pressure (temporary)  Weakness in arm/leg (temporary)  Pressure sensation in back/neck (temporary)  Call if you experience:  Fever/chills associated with headache or increased back/neck pain.  Headache worsened by an upright position.  New onset weakness or numbness of an extremity below the injection site  Hives or difficulty breathing (go to the emergency room)  Inflammation or drainage at the infection site  Severe back/neck pain  Any new symptoms which are concerning to you  Please note:  Although the local anesthetic injected can often make your back or neck feel good for several hours after the injection, the pain will likely return.  It takes 3-7 days for steroids to work in the epidural space.  You may not notice any pain relief for at least that one week.  If effective, we will often do a series of three injections spaced 3-6 weeks apart to maximally decrease your pain.  After the initial series, we generally will wait several months before considering a repeat injection of the same type.  If you have any questions, please call [phone] [facility]

## 2017-11-20 NOTE — Progress Notes (Signed)
Safety precautions to be maintained throughout the outpatient stay will include: orient to surroundings, keep bed in low position, maintain call bell within reach at all times, provide assistance with transfer out of bed and ambulation.  

## 2017-11-25 ENCOUNTER — Other Ambulatory Visit: Payer: Self-pay

## 2017-11-25 ENCOUNTER — Ambulatory Visit
Admission: RE | Admit: 2017-11-25 | Discharge: 2017-11-25 | Disposition: A | Discharge status: Home / Self Care | Payer: Medicare Other | Source: Ambulatory Visit | Attending: Student in an Organized Health Care Education/Training Program | Admitting: Student in an Organized Health Care Education/Training Program

## 2017-11-25 ENCOUNTER — Encounter: Payer: Self-pay | Admitting: Student in an Organized Health Care Education/Training Program

## 2017-11-25 ENCOUNTER — Ambulatory Visit (HOSPITAL_BASED_OUTPATIENT_CLINIC_OR_DEPARTMENT_OTHER): Payer: Medicare Other | Admitting: Student in an Organized Health Care Education/Training Program

## 2017-11-25 VITALS — BP 201/93 | HR 66 | Temp 97.6°F | Resp 16 | Ht 71.0 in | Wt 187.0 lb

## 2017-11-25 DIAGNOSIS — Z79899 Other long term (current) drug therapy: Secondary | ICD-10-CM | POA: Diagnosis not present

## 2017-11-25 DIAGNOSIS — Z794 Long term (current) use of insulin: Secondary | ICD-10-CM | POA: Insufficient documentation

## 2017-11-25 DIAGNOSIS — Z9889 Other specified postprocedural states: Secondary | ICD-10-CM | POA: Insufficient documentation

## 2017-11-25 DIAGNOSIS — M48061 Spinal stenosis, lumbar region without neurogenic claudication: Secondary | ICD-10-CM | POA: Insufficient documentation

## 2017-11-25 DIAGNOSIS — M5416 Radiculopathy, lumbar region: Secondary | ICD-10-CM | POA: Diagnosis not present

## 2017-11-25 DIAGNOSIS — Z7982 Long term (current) use of aspirin: Secondary | ICD-10-CM | POA: Insufficient documentation

## 2017-11-25 DIAGNOSIS — M9983 Other biomechanical lesions of lumbar region: Secondary | ICD-10-CM

## 2017-11-25 DIAGNOSIS — Z888 Allergy status to other drugs, medicaments and biological substances status: Secondary | ICD-10-CM | POA: Insufficient documentation

## 2017-11-25 DIAGNOSIS — Z79891 Long term (current) use of opiate analgesic: Secondary | ICD-10-CM | POA: Diagnosis not present

## 2017-11-25 MED ORDER — DEXAMETHASONE SODIUM PHOSPHATE 10 MG/ML IJ SOLN
10.0000 mg | Freq: Once | INTRAMUSCULAR | Status: AC
  Administered 2017-11-25: 10 mg
  Filled 2017-11-25: qty 1

## 2017-11-25 MED ORDER — LIDOCAINE HCL (PF) 1 % IJ SOLN
4.5000 mL | Freq: Once | INTRAMUSCULAR | Status: AC
  Administered 2017-11-25: 4.5 mL
  Filled 2017-11-25: qty 5

## 2017-11-25 MED ORDER — LACTATED RINGERS IV SOLN
1000.0000 mL | Freq: Once | INTRAVENOUS | Status: AC
  Administered 2017-11-25: 1000 mL via INTRAVENOUS

## 2017-11-25 MED ORDER — SODIUM CHLORIDE 0.9% FLUSH
2.0000 mL | Freq: Once | INTRAVENOUS | Status: AC
  Administered 2017-11-25: 2 mL

## 2017-11-25 MED ORDER — ROPIVACAINE HCL 2 MG/ML IJ SOLN
2.0000 mL | Freq: Once | INTRAMUSCULAR | Status: AC
  Administered 2017-11-25: 2 mL via EPIDURAL
  Filled 2017-11-25: qty 10

## 2017-11-25 MED ORDER — FENTANYL CITRATE (PF) 100 MCG/2ML IJ SOLN
25.0000 ug | INTRAMUSCULAR | Status: DC | PRN
  Administered 2017-11-25: 25 ug via INTRAVENOUS
  Filled 2017-11-25: qty 2

## 2017-11-25 MED ORDER — IOPAMIDOL (ISOVUE-M 200) INJECTION 41%
10.0000 mL | Freq: Once | INTRAMUSCULAR | Status: AC
  Administered 2017-11-25: 1 mL via EPIDURAL
  Filled 2017-11-25: qty 10

## 2017-11-25 NOTE — Patient Instructions (Signed)
GENERAL RISKS AND COMPLICATIONS  What are the risk, side effects and possible complications? Generally speaking, most procedures are safe.  However, with any procedure there are risks, side effects, and the possibility of complications.  The risks and complications are dependent upon the sites that are lesioned, or the type of nerve block to be performed.  The closer the procedure is to the spine, the more serious the risks are.  Great care is taken when placing the radio frequency needles, block needles or lesioning probes, but sometimes complications can occur. 1. Infection: Any time there is an injection through the skin, there is a risk of infection.  This is why sterile conditions are used for these blocks.  There are four possible types of infection. 1. Localized skin infection. 2. Central Nervous System Infection-This can be in the form of Meningitis, which can be deadly. 3. Epidural Infections-This can be in the form of an epidural abscess, which can cause pressure inside of the spine, causing compression of the spinal cord with subsequent paralysis. This would require an emergency surgery to decompress, and there are no guarantees that the patient would recover from the paralysis. 4. Discitis-This is an infection of the intervertebral discs.  It occurs in about 1% of discography procedures.  It is difficult to treat and it may lead to surgery.        2. Pain: the needles have to go through skin and soft tissues, will cause soreness.       3. Damage to internal structures:  The nerves to be lesioned may be near blood vessels or    other nerves which can be potentially damaged.       4. Bleeding: Bleeding is more common if the patient is taking blood thinners such as  aspirin, Coumadin, Ticiid, Plavix, etc., or if he/she have some genetic predisposition  such as hemophilia. Bleeding into the spinal canal can cause compression of the spinal  cord with subsequent paralysis.  This would require an  emergency surgery to  decompress and there are no guarantees that the patient would recover from the  paralysis.       5. Pneumothorax:  Puncturing of a lung is a possibility, every time a needle is introduced in  the area of the chest or upper back.  Pneumothorax refers to free air around the  collapsed lung(s), inside of the thoracic cavity (chest cavity).  Another two possible  complications related to a similar event would include: Hemothorax and Chylothorax.   These are variations of the Pneumothorax, where instead of air around the collapsed  lung(s), you may have blood or chyle, respectively.       6. Spinal headaches: They may occur with any procedures in the area of the spine.       7. Persistent CSF (Cerebro-Spinal Fluid) leakage: This is a rare problem, but may occur  with prolonged intrathecal or epidural catheters either due to the formation of a fistulous  track or a dural tear.       8. Nerve damage: By working so close to the spinal cord, there is always a possibility of  nerve damage, which could be as serious as a permanent spinal cord injury with  paralysis.       9. Death:  Although rare, severe deadly allergic reactions known as "Anaphylactic  reaction" can occur to any of the medications used.      10. Worsening of the symptoms:  We can always make thing worse.    What are the chances of something like this happening? Chances of any of this occuring are extremely low.  By statistics, you have more of a chance of getting killed in a motor vehicle accident: while driving to the hospital than any of the above occurring .  Nevertheless, you should be aware that they are possibilities.  In general, it is similar to taking a shower.  Everybody knows that you can slip, hit your head and get killed.  Does that mean that you should not shower again?  Nevertheless always keep in mind that statistics do not mean anything if you happen to be on the wrong side of them.  Even if a procedure has a 1  (one) in a 1,000,000 (million) chance of going wrong, it you happen to be that one..Also, keep in mind that by statistics, you have more of a chance of having something go wrong when taking medications.  Who should not have this procedure? If you are on a blood thinning medication (e.g. Coumadin, Plavix, see list of "Blood Thinners"), or if you have an active infection going on, you should not have the procedure.  If you are taking any blood thinners, please inform your physician.  How should I prepare for this procedure?  Do not eat or drink anything at least six hours prior to the procedure.  Bring a driver with you .  It cannot be a taxi.  Come accompanied by an adult that can drive you back, and that is strong enough to help you if your legs get weak or numb from the local anesthetic.  Take all of your medicines the morning of the procedure with just enough water to swallow them.  If you have diabetes, make sure that you are scheduled to have your procedure done first thing in the morning, whenever possible.  If you have diabetes, take only half of your insulin dose and notify our nurse that you have done so as soon as you arrive at the clinic.  If you are diabetic, but only take blood sugar pills (oral hypoglycemic), then do not take them on the morning of your procedure.  You may take them after you have had the procedure.  Do not take aspirin or any aspirin-containing medications, at least eleven (11) days prior to the procedure.  They may prolong bleeding.  Wear loose fitting clothing that may be easy to take off and that you would not mind if it got stained with Betadine or blood.  Do not wear any jewelry or perfume  Remove any nail coloring.  It will interfere with some of our monitoring equipment.  NOTE: Remember that this is not meant to be interpreted as a complete list of all possible complications.  Unforeseen problems may occur.  BLOOD THINNERS The following drugs  contain aspirin or other products, which can cause increased bleeding during surgery and should not be taken for 2 weeks prior to and 1 week after surgery.  If you should need take something for relief of minor pain, you may take acetaminophen which is found in Tylenol,m Datril, Anacin-3 and Panadol. It is not blood thinner. The products listed below are.  Do not take any of the products listed below in addition to any listed on your instruction sheet.  A.P.C or A.P.C with Codeine Codeine Phosphate Capsules #3 Ibuprofen Ridaura  ABC compound Congesprin Imuran rimadil  Advil Cope Indocin Robaxisal  Alka-Seltzer Effervescent Pain Reliever and Antacid Coricidin or Coricidin-D  Indomethacin Rufen    Alka-Seltzer plus Cold Medicine Cosprin Ketoprofen S-A-C Tablets  Anacin Analgesic Tablets or Capsules Coumadin Korlgesic Salflex  Anacin Extra Strength Analgesic tablets or capsules CP-2 Tablets Lanoril Salicylate  Anaprox Cuprimine Capsules Levenox Salocol  Anexsia-D Dalteparin Magan Salsalate  Anodynos Darvon compound Magnesium Salicylate Sine-off  Ansaid Dasin Capsules Magsal Sodium Salicylate  Anturane Depen Capsules Marnal Soma  APF Arthritis pain formula Dewitt's Pills Measurin Stanback  Argesic Dia-Gesic Meclofenamic Sulfinpyrazone  Arthritis Bayer Timed Release Aspirin Diclofenac Meclomen Sulindac  Arthritis pain formula Anacin Dicumarol Medipren Supac  Analgesic (Safety coated) Arthralgen Diffunasal Mefanamic Suprofen  Arthritis Strength Bufferin Dihydrocodeine Mepro Compound Suprol  Arthropan liquid Dopirydamole Methcarbomol with Aspirin Synalgos  ASA tablets/Enseals Disalcid Micrainin Tagament  Ascriptin Doan's Midol Talwin  Ascriptin A/D Dolene Mobidin Tanderil  Ascriptin Extra Strength Dolobid Moblgesic Ticlid  Ascriptin with Codeine Doloprin or Doloprin with Codeine Momentum Tolectin  Asperbuf Duoprin Mono-gesic Trendar  Aspergum Duradyne Motrin or Motrin IB Triminicin  Aspirin  plain, buffered or enteric coated Durasal Myochrisine Trigesic  Aspirin Suppositories Easprin Nalfon Trillsate  Aspirin with Codeine Ecotrin Regular or Extra Strength Naprosyn Uracel  Atromid-S Efficin Naproxen Ursinus  Auranofin Capsules Elmiron Neocylate Vanquish  Axotal Emagrin Norgesic Verin  Azathioprine Empirin or Empirin with Codeine Normiflo Vitamin E  Azolid Emprazil Nuprin Voltaren  Bayer Aspirin plain, buffered or children's or timed BC Tablets or powders Encaprin Orgaran Warfarin Sodium  Buff-a-Comp Enoxaparin Orudis Zorpin  Buff-a-Comp with Codeine Equegesic Os-Cal-Gesic   Buffaprin Excedrin plain, buffered or Extra Strength Oxalid   Bufferin Arthritis Strength Feldene Oxphenbutazone   Bufferin plain or Extra Strength Feldene Capsules Oxycodone with Aspirin   Bufferin with Codeine Fenoprofen Fenoprofen Pabalate or Pabalate-SF   Buffets II Flogesic Panagesic   Buffinol plain or Extra Strength Florinal or Florinal with Codeine Panwarfarin   Buf-Tabs Flurbiprofen Penicillamine   Butalbital Compound Four-way cold tablets Penicillin   Butazolidin Fragmin Pepto-Bismol   Carbenicillin Geminisyn Percodan   Carna Arthritis Reliever Geopen Persantine   Carprofen Gold's salt Persistin   Chloramphenicol Goody's Phenylbutazone   Chloromycetin Haltrain Piroxlcam   Clmetidine heparin Plaquenil   Cllnoril Hyco-pap Ponstel   Clofibrate Hydroxy chloroquine Propoxyphen         Before stopping any of these medications, be sure to consult the physician who ordered them.  Some, such as Coumadin (Warfarin) are ordered to prevent or treat serious conditions such as "deep thrombosis", "pumonary embolisms", and other heart problems.  The amount of time that you may need off of the medication may also vary with the medication and the reason for which you were taking it.  If you are taking any of these medications, please make sure you notify your pain physician before you undergo any  procedures.         Pain Management Discharge Instructions  General Discharge Instructions :  If you need to reach your doctor call: Monday-Friday 8:00 am - 4:00 pm at [phone] or toll free [phone].  After clinic hours [phone] to have operator reach doctor.  Bring all of your medication bottles to all your appointments in the pain clinic.  To cancel or reschedule your appointment with Pain Management please remember to call 24 hours in advance to avoid a fee.  Refer to the educational materials which you have been given on: General Risks, I had my Procedure. Discharge Instructions, Post Sedation.  Post Procedure Instructions:  The drugs you were given will stay in your system until tomorrow, so for the next 24 hours you should   not drive, make any legal decisions or drink any alcoholic beverages.  You may eat anything you prefer, but it is better to start with liquids then soups and crackers, and gradually work up to solid foods.  Please notify your doctor immediately if you have any unusual bleeding, trouble breathing or pain that is not related to your normal pain.  Depending on the type of procedure that was done, some parts of your body may feel week and/or numb.  This usually clears up by tonight or the next day.  Walk with the use of an assistive device or accompanied by an adult for the 24 hours.  You may use ice on the affected area for the first 24 hours.  Put ice in a Ziploc bag and cover with a towel and place against area 15 minutes on 15 minutes off.  You may switch to heat after 24 hours.

## 2017-11-25 NOTE — Progress Notes (Signed)
Safety precautions to be maintained throughout the outpatient stay will include: orient to surroundings, keep bed in low position, maintain call bell within reach at all times, provide assistance with transfer out of bed and ambulation.  

## 2017-11-25 NOTE — Progress Notes (Deleted)
Patient's Name: Dylan Quinn  MRN: [id number]  Referring Provider: Tracie Harrier, MD  DOB: 15-Oct-1936  PCP: Tracie Harrier, MD  DOS: 11/25/2017  Note by: Gillis Santa, MD  Service setting: Ambulatory outpatient  Specialty: Interventional Pain Management  Patient type: Established  Location: [facility] (AMB) Pain Management Facility  Visit type: Interventional Procedure   Primary Reason for Visit: Interventional Pain Management Treatment. CC: Back Pain (lower)  Procedure:  Anesthesia, Analgesia, Anxiolysis:  Type: Therapeutic Inter-Laminar Epidural Steroid Injection Region: Lumbar Level: L5-S1 Level. Laterality: Midline         Type: Local Anesthesia with Moderate (Conscious) Sedation Local Anesthetic: Lidocaine 1% Route: Intravenous (IV) IV Access: Secured Sedation: Meaningful verbal contact was maintained at all times during the procedure  Indication(s): Analgesia and Anxiety   Indications: 1. Lumbar radiculopathy   2. Foraminal stenosis of lumbar region    Pain Score: Pre-procedure: 6 /10 Post-procedure: 6 /10  Pre-op Assessment:  Mr. Alvizo is a 81 y.o. (year old), male patient, seen today for interventional treatment. He  has a past surgical history that includes Cataract extraction (2006); Cataract extraction (1997); Appendectomy (1986); and TEE without cardioversion (N/A, 12/19/2016). Mr. Viloria has a current medication list which includes the following prescription(s): aspirin, brimonidine-timolol, clobetasol cream, colchicine, cyanocobalamin, docusate sodium, dorzolamide, hydrochlorothiazide, hydrocodone-acetaminophen, insulin glargine, insulin lispro, latanoprost, levocetirizine, meloxicam, olmesartan, potassium chloride sa, pregabalin, glucagon, polyethylene glycol, and tramadol, and the following Facility-Administered Medications: fentanyl and lactated ringers. His primarily concern today is the Back Pain (lower)  Initial Vital Signs: There were no vitals taken for this  visit. BMI: Estimated body mass index is 26.08 kg/m as calculated from the following:   Height as of this encounter: 5\' 11"  (1.803 m).   Weight as of this encounter: 187 lb (84.8 kg).  Risk Assessment: Allergies: Reviewed. He is allergic to lisinopril.  Allergy Precautions: None required Coagulopathies: Reviewed. None identified.  Blood-thinner therapy: None at this time Active Infection(s): Reviewed. None identified. Mr. Blasius is afebrile  Site Confirmation: Mr. Mangano was asked to confirm the procedure and laterality before marking the site Procedure checklist: Completed Consent: Before the procedure and under the influence of no sedative(s), amnesic(s), or anxiolytics, the patient was informed of the treatment options, risks and possible complications. To fulfill our ethical and legal obligations, as recommended by the American Medical Association's Code of Ethics, I have informed the patient of my clinical impression; the nature and purpose of the treatment or procedure; the risks, benefits, and possible complications of the intervention; the alternatives, including doing nothing; the risk(s) and benefit(s) of the alternative treatment(s) or procedure(s); and the risk(s) and benefit(s) of doing nothing. The patient was provided information about the general risks and possible complications associated with the procedure. These may include, but are not limited to: failure to achieve desired goals, infection, bleeding, organ or nerve damage, allergic reactions, paralysis, and death. In addition, the patient was informed of those risks and complications associated to Spine-related procedures, such as failure to decrease pain; infection (i.e.: Meningitis, epidural or intraspinal abscess); bleeding (i.e.: epidural hematoma, subarachnoid hemorrhage, or any other type of intraspinal or peri-dural bleeding); organ or nerve damage (i.e.: Any type of peripheral nerve, nerve root, or spinal cord injury)  with subsequent damage to sensory, motor, and/or autonomic systems, resulting in permanent pain, numbness, and/or weakness of one or several areas of the body; allergic reactions; (i.e.: anaphylactic reaction); and/or death. Furthermore, the patient was informed of those risks and complications associated with the medications. These  include, but are not limited to: allergic reactions (i.e.: anaphylactic or anaphylactoid reaction(s)); adrenal axis suppression; blood sugar elevation that in diabetics may result in ketoacidosis or comma; water retention that in patients with history of congestive heart failure may result in shortness of breath, pulmonary edema, and decompensation with resultant heart failure; weight gain; swelling or edema; medication-induced neural toxicity; particulate matter embolism and blood vessel occlusion with resultant organ, and/or nervous system infarction; and/or aseptic necrosis of one or more joints. Finally, the patient was informed that Medicine is not an exact science; therefore, there is also the possibility of unforeseen or unpredictable risks and/or possible complications that may result in a catastrophic outcome. The patient indicated having understood very clearly. We have given the patient no guarantees and we have made no promises. Enough time was given to the patient to ask questions, all of which were answered to the patient's satisfaction. Mr. Jumper has indicated that he wanted to continue with the procedure. Attestation: I, the ordering provider, attest that I have discussed with the patient the benefits, risks, side-effects, alternatives, likelihood of achieving goals, and potential problems during recovery for the procedure that I have provided informed consent. Date: 11/25/2017; Time: 8:05 AM  Pre-Procedure Preparation:  Monitoring: As per clinic protocol. Respiration, ETCO2, SpO2, BP, heart rate and rhythm monitor placed and checked for adequate function Safety  Precautions: Patient was assessed for positional comfort and pressure points before starting the procedure. Time-out: I initiated and conducted the "Time-out" before starting the procedure, as per protocol. The patient was asked to participate by confirming the accuracy of the "Time Out" information. Verification of the correct person, site, and procedure were performed and confirmed by me, the nursing staff, and the patient. "Time-out" conducted as per Joint Commission's Universal Protocol (UP.01.01.01). "Time-out" Date & Time: 11/25/2017;   hrs.  Description of Procedure Process:   Position: Prone with head of the table was raised to facilitate breathing. Target Area: The interlaminar space, initially targeting the lower laminar border of the superior vertebral body. Approach: Paramedial approach. Area Prepped: Entire Posterior Lumbar Region Prepping solution: ChloraPrep (2% chlorhexidine gluconate and 70% isopropyl alcohol) Safety Precautions: Aspiration looking for blood return was conducted prior to all injections. At no point did we inject any substances, as a needle was being advanced. No attempts were made at seeking any paresthesias. Safe injection practices and needle disposal techniques used. Medications properly checked for expiration dates. SDV (single dose vial) medications used. Description of the Procedure: Protocol guidelines were followed. The procedure needle was introduced through the skin, ipsilateral to the reported pain, and advanced to the target area. Bone was contacted and the needle walked caudad, until the lamina was cleared. The epidural space was identified using "loss-of-resistance technique" with 2-3 ml of PF-NaCl (0.9% NSS), in a 5cc LOR glass syringe. Vitals:   11/25/17 0808  BP: (!) 167/89  Pulse: 72  Resp: 16  Temp: 98.2 F (36.8 C)  TempSrc: Oral  SpO2: 100%  Weight: 187 lb (84.8 kg)  Height: 5\' 11"  (1.803 m)    Start Time:   hrs. End Time:    hrs. Materials:  Needle(s) Type: Epidural needle Gauge: 17G Length: 3.5-in Medication(s): We administered fentaNYL, iopamidol, ropivacaine (PF) 2 mg/mL (0.2%), sodium chloride flush, dexamethasone, and lidocaine (PF). Please see chart orders for dosing details.  Imaging Guidance (Spinal):  Type of Imaging Technique: Fluoroscopy Guidance (Spinal) Indication(s): Assistance in needle guidance and placement for procedures requiring needle placement in or near specific anatomical  locations not easily accessible without such assistance. Exposure Time: Please see nurses notes. Contrast: Before injecting any contrast, we confirmed that the patient did not have an allergy to iodine, shellfish, or radiological contrast. Once satisfactory needle placement was completed at the desired level, radiological contrast was injected. Contrast injected under live fluoroscopy. No contrast complications. See chart for type and volume of contrast used. Fluoroscopic Guidance: I was personally present during the use of fluoroscopy. "Tunnel Vision Technique" used to obtain the best possible view of the target area. Parallax error corrected before commencing the procedure. "Direction-depth-direction" technique used to introduce the needle under continuous pulsed fluoroscopy. Once target was reached, antero-posterior, oblique, and lateral fluoroscopic projection used confirm needle placement in all planes. Images permanently stored in EMR. Interpretation: I personally interpreted the imaging intraoperatively. Adequate needle placement confirmed in multiple planes. Appropriate spread of contrast into desired area was observed. No evidence of afferent or efferent intravascular uptake. No intrathecal or subarachnoid spread observed. Permanent images saved into the patient's record.  Antibiotic Prophylaxis:  Indication(s): None identified Antibiotic given: None  Post-operative Assessment:  EBL: None Complications: No immediate  post-treatment complications observed by team, or reported by patient. Note: The patient tolerated the entire procedure well. A repeat set of vitals were taken after the procedure and the patient was kept under observation following institutional policy, for this type of procedure. Post-procedural neurological assessment was performed, showing return to baseline, prior to discharge. The patient was provided with post-procedure discharge instructions, including a section on how to identify potential problems. Should any problems arise concerning this procedure, the patient was given instructions to immediately contact us, at any time, without hesitation. In any case, we plan to contact the patient by telephone for a follow-up status report regarding this interventional procedure. Comments:  No additional relevant information.  Plan of Care    Imaging Orders     DG C-Arm 1-60 Min-No Report Procedure Orders    No procedure(s) ordered today    Medications ordered for procedure: Meds ordered this encounter  Medications  . lactated ringers infusion 1,000 mL  . fentaNYL (SUBLIMAZE) injection 25-100 mcg    Make sure Narcan is available in the pyxis when using this medication. In the event of respiratory depression (RR< 8/min): Titrate NARCAN (naloxone) in increments of 0.1 to 0.2 mg IV at 2-3 minute intervals, until desired degree of reversal.  . iopamidol (ISOVUE-M) 41 % intrathecal injection 10 mL  . ropivacaine (PF) 2 mg/mL (0.2%) (NAROPIN) injection 2 mL  . sodium chloride flush (NS) 0.9 % injection 2 mL  . dexamethasone (DECADRON) injection 10 mg  . lidocaine (PF) (XYLOCAINE) 1 % injection 4.5 mL   Medications administered: We administered fentaNYL, iopamidol, ropivacaine (PF) 2 mg/mL (0.2%), sodium chloride flush, dexamethasone, and lidocaine (PF).  See the medical record for exact dosing, route, and time of administration.  This SmartLink is deprecated. Use AVSMEDLIST instead to display  the medication list for a patient. Disposition: Discharge home  Discharge Date & Time: 11/25/2017;   hrs.   Physician-requested Follow-up: No Follow-up on file. No future appointments. Primary Care Physician: Tracie Harrier, MD Location: [facility] Note by: Gillis Santa, MD Date: 11/25/2017; Time: 8:41 AM  Disclaimer:  Medicine is not an exact science. The only guarantee in medicine is that nothing is guaranteed. It is important to note that the decision to proceed with this intervention was based on the information collected from the patient. The Data and conclusions were drawn  from the patient's questionnaire, the interview, and the physical examination. Because the information was provided in large part by the patient, it cannot be guaranteed that it has not been purposely or unconsciously manipulated. Every effort has been made to obtain as much relevant data as possible for this evaluation. It is important to note that the conclusions that lead to this procedure are derived in large part from the available data. Always take into account that the treatment will also be dependent on availability of resources and existing treatment guidelines, considered by other Pain Management Practitioners as being common knowledge and practice, at the time of the intervention. For Medico-Legal purposes, it is also important to point out that variation in procedural techniques and pharmacological choices are the acceptable norm. The indications, contraindications, technique, and results of the above procedure should only be interpreted and judged by a Board-Certified Interventional Pain Specialist with extensive familiarity and expertise in the same exact procedure and technique.

## 2017-11-25 NOTE — Progress Notes (Signed)
Patient's Name: Dylan Quinn  MRN: [id number]  Referring Provider: Tracie Harrier, MD  DOB: Jun 05, 1936  PCP: Tracie Harrier, MD  DOS: 11/25/2017  Note by: Gillis Santa, MD  Service setting: Ambulatory outpatient  Specialty: Interventional Pain Management  Patient type: Established  Location: [facility] (AMB) Pain Management Facility  Visit type: Interventional Procedure   Primary Reason for Visit: Interventional Pain Management Treatment. CC: Back Pain (lower)  Procedure:  Anesthesia, Analgesia, Anxiolysis:  Type: Diagnostic Epidural Steroid Injection Region: Caudal Level: Sacrococcygeal   Laterality: Midline aiming at the left  Type: Local Anesthesia with Moderate (Conscious) Sedation Local Anesthetic: Lidocaine 1% Route: Intravenous (IV) IV Access: Secured Sedation: Meaningful verbal contact was maintained at all times during the procedure  Indication(s): Analgesia and Anxiety   Indications: 1. Lumbar radiculopathy   2. Foraminal stenosis of lumbar region    Pain Score: Pre-procedure: 6 /10 Post-procedure: 0-No pain/10  Pre-op Assessment:  Dylan Quinn is a 81 y.o. (year old), male patient, seen today for interventional treatment. He  has a past surgical history that includes Cataract extraction (2006); Cataract extraction (1997); Appendectomy (1986); and TEE without cardioversion (N/A, 12/19/2016). Dylan Quinn has a current medication list which includes the following prescription(s): aspirin, brimonidine-timolol, clobetasol cream, colchicine, cyanocobalamin, docusate sodium, dorzolamide, hydrochlorothiazide, hydrocodone-acetaminophen, insulin glargine, insulin lispro, latanoprost, levocetirizine, meloxicam, olmesartan, potassium chloride sa, pregabalin, glucagon, polyethylene glycol, and tramadol, and the following Facility-Administered Medications: fentanyl. His primarily concern today is the Back Pain (lower)  Initial Vital Signs: Blood pressure (!) 201/93, pulse 66, temperature  97.6 F (36.4 C), resp. rate 16, height 5\' 11"  (1.803 m), weight 187 lb (84.8 kg), SpO2 99 %. BMI: Estimated body mass index is 26.08 kg/m as calculated from the following:   Height as of this encounter: 5\' 11"  (1.803 m).   Weight as of this encounter: 187 lb (84.8 kg).  Risk Assessment: Allergies: Reviewed. He is allergic to lisinopril.  Allergy Precautions: None required Coagulopathies: Reviewed. None identified.  Blood-thinner therapy: None at this time Active Infection(s): Reviewed. None identified. Dylan Quinn is afebrile  Site Confirmation: Dylan Quinn was asked to confirm the procedure and laterality before marking the site Procedure checklist: Completed Consent: Before the procedure and under the influence of no sedative(s), amnesic(s), or anxiolytics, the patient was informed of the treatment options, risks and possible complications. To fulfill our ethical and legal obligations, as recommended by the American Medical Association's Code of Ethics, I have informed the patient of my clinical impression; the nature and purpose of the treatment or procedure; the risks, benefits, and possible complications of the intervention; the alternatives, including doing nothing; the risk(s) and benefit(s) of the alternative treatment(s) or procedure(s); and the risk(s) and benefit(s) of doing nothing. The patient was provided information about the general risks and possible complications associated with the procedure. These may include, but are not limited to: failure to achieve desired goals, infection, bleeding, organ or nerve damage, allergic reactions, paralysis, and death. In addition, the patient was informed of those risks and complications associated to Spine-related procedures, such as failure to decrease pain; infection (i.e.: Meningitis, epidural or intraspinal abscess); bleeding (i.e.: epidural hematoma, subarachnoid hemorrhage, or any other type of intraspinal or peri-dural bleeding); organ  or nerve damage (i.e.: Any type of peripheral nerve, nerve root, or spinal cord injury) with subsequent damage to sensory, motor, and/or autonomic systems, resulting in permanent pain, numbness, and/or weakness of one or several areas of the body; allergic reactions; (i.e.: anaphylactic reaction); and/or death. Furthermore, the  patient was informed of those risks and complications associated with the medications. These include, but are not limited to: allergic reactions (i.e.: anaphylactic or anaphylactoid reaction(s)); adrenal axis suppression; blood sugar elevation that in diabetics may result in ketoacidosis or comma; water retention that in patients with history of congestive heart failure may result in shortness of breath, pulmonary edema, and decompensation with resultant heart failure; weight gain; swelling or edema; medication-induced neural toxicity; particulate matter embolism and blood vessel occlusion with resultant organ, and/or nervous system infarction; and/or aseptic necrosis of one or more joints. Finally, the patient was informed that Medicine is not an exact science; therefore, there is also the possibility of unforeseen or unpredictable risks and/or possible complications that may result in a catastrophic outcome. The patient indicated having understood very clearly. We have given the patient no guarantees and we have made no promises. Enough time was given to the patient to ask questions, all of which were answered to the patient's satisfaction. Dylan Quinn has indicated that he wanted to continue with the procedure. Attestation: I, the ordering provider, attest that I have discussed with the patient the benefits, risks, side-effects, alternatives, likelihood of achieving goals, and potential problems during recovery for the procedure that I have provided informed consent. Date: 11/25/2017; Time: 10:04 AM  Pre-Procedure Preparation:  Monitoring: As per clinic protocol. Respiration, ETCO2,  SpO2, BP, heart rate and rhythm monitor placed and checked for adequate function Safety Precautions: Patient was assessed for positional comfort and pressure points before starting the procedure. Time-out: I initiated and conducted the "Time-out" before starting the procedure, as per protocol. The patient was asked to participate by confirming the accuracy of the "Time Out" information. Verification of the correct person, site, and procedure were performed and confirmed by me, the nursing staff, and the patient. "Time-out" conducted as per Joint Commission's Universal Protocol (UP.01.01.01). "Time-out" Date & Time: 11/25/2017; 0859 hrs.  Description of Procedure Process:   Position: Prone Target Area: Caudal Epidural Canal. Approach: Midline approach. Area Prepped: Entire Posterior Sacrococcygeal Region Prepping solution: ChloraPrep (2% chlorhexidine gluconate and 70% isopropyl alcohol) Safety Precautions: Aspiration looking for blood return was conducted prior to all injections. At no point did we inject any substances, as a needle was being advanced. No attempts were made at seeking any paresthesias. Safe injection practices and needle disposal techniques used. Medications properly checked for expiration dates. SDV (single dose vial) medications used. Description of the Procedure: Protocol guidelines were followed. The patient was placed in position over the fluoroscopy table. The target area was identified and the area prepped in the usual manner. Skin desensitized using vapocoolant spray. Skin & deeper tissues infiltrated with local anesthetic. Appropriate amount of time allowed to pass for local anesthetics to take effect. The procedure needles were then advanced to the target area. Proper needle placement secured. Negative aspiration confirmed. Solution injected in intermittent fashion, asking for systemic symptoms every 0.5cc of injectate. The needles were then removed and the area cleansed, making  sure to leave some of the prepping solution back to take advantage of its long term bactericidal properties. Vitals:   11/25/17 0915 11/25/17 0925 11/25/17 0935 11/25/17 0945  BP: (!) 164/75 (!) 178/80 (!) 146/69 (!) 201/93  Pulse: 66 62 64 66  Resp: 14 16 16 16   Temp:  97.9 F (36.6 C)  97.6 F (36.4 C)  TempSrc:      SpO2: 99% 98% 98% 99%  Weight:      Height:  Start Time: 0859 hrs. End Time: 0914 hrs. Materials:  Needle(s) Type: Epidural needle Gauge: 17G Length: 3.5-in Medication(s): We administered lactated ringers, fentaNYL, iopamidol, ropivacaine (PF) 2 mg/mL (0.2%), sodium chloride flush, dexamethasone, and lidocaine (PF). Please see chart orders for dosing details. 8 cc total: 5 cc preservative-free saline, 2 cc 0.2% ropivacaine, 1 cc Decadron 10 mg/cc Imaging Guidance (Spinal):  Type of Imaging Technique: Fluoroscopy Guidance (Spinal) Indication(s): Assistance in needle guidance and placement for procedures requiring needle placement in or near specific anatomical locations not easily accessible without such assistance. Exposure Time: Please see nurses notes. Contrast: Before injecting any contrast, we confirmed that the patient did not have an allergy to iodine, shellfish, or radiological contrast. Once satisfactory needle placement was completed at the desired level, radiological contrast was injected. Contrast injected under live fluoroscopy. No contrast complications. See chart for type and volume of contrast used. Fluoroscopic Guidance: I was personally present during the use of fluoroscopy. "Tunnel Vision Technique" used to obtain the best possible view of the target area. Parallax error corrected before commencing the procedure. "Direction-depth-direction" technique used to introduce the needle under continuous pulsed fluoroscopy. Once target was reached, antero-posterior, oblique, and lateral fluoroscopic projection used confirm needle placement in all planes.  Images permanently stored in EMR. Interpretation: I personally interpreted the imaging intraoperatively. Adequate needle placement confirmed in multiple planes. Appropriate spread of contrast into desired area was observed. No evidence of afferent or efferent intravascular uptake. No intrathecal or subarachnoid spread observed. Permanent images saved into the patient's record.  Antibiotic Prophylaxis:  Indication(s): None identified Antibiotic given: None  Post-operative Assessment:  EBL: None Complications: No immediate post-treatment complications observed by team, or reported by patient. Note: The patient tolerated the entire procedure well. A repeat set of vitals were taken after the procedure and the patient was kept under observation following institutional policy, for this type of procedure. Post-procedural neurological assessment was performed, showing return to baseline, prior to discharge. The patient was provided with post-procedure discharge instructions, including a section on how to identify potential problems. Should any problems arise concerning this procedure, the patient was given instructions to immediately contact us, at any time, without hesitation. In any case, we plan to contact the patient by telephone for a follow-up status report regarding this interventional procedure. Comments:  No additional relevant information. Lower extremity strength at baseline post procedure.  Provider-requested follow-up: Return in about 2 weeks (around 12/09/2017) for Post Procedure Evaluation.  Future Appointments  Date Time Provider Meta  12/03/2017  1:45 PM Gillis Santa, MD [facility] None    Primary Care Physician: Tracie Harrier, MD Location: [facility] Management Facility Note by: Gillis Santa, M.D Date: 11/25/2017; Time: 1:44 PM  Patient Instructions   GENERAL RISKS AND COMPLICATIONS  What are the risk, side effects and possible  complications? Generally speaking, most procedures are safe.  However, with any procedure there are risks, side effects, and the possibility of complications.  The risks and complications are dependent upon the sites that are lesioned, or the type of nerve block to be performed.  The closer the procedure is to the spine, the more serious the risks are.  Great care is taken when placing the radio frequency needles, block needles or lesioning probes, but sometimes complications can occur. 1. Infection: Any time there is an injection through the skin, there is a risk of infection.  This is why sterile conditions are used for these blocks.  There are four possible types of infection.  1. Localized skin infection. 2. Central Nervous System Infection-This can be in the form of Meningitis, which can be deadly. 3. Epidural Infections-This can be in the form of an epidural abscess, which can cause pressure inside of the spine, causing compression of the spinal cord with subsequent paralysis. This would require an emergency surgery to decompress, and there are no guarantees that the patient would recover from the paralysis. 4. Discitis-This is an infection of the intervertebral discs.  It occurs in about 1% of discography procedures.  It is difficult to treat and it may lead to surgery.        2. Pain: the needles have to go through skin and soft tissues, will cause soreness.       3. Damage to internal structures:  The nerves to be lesioned may be near blood vessels or    other nerves which can be potentially damaged.       4. Bleeding: Bleeding is more common if the patient is taking blood thinners such as  aspirin, Coumadin, Ticiid, Plavix, etc., or if he/she have some genetic predisposition  such as hemophilia. Bleeding into the spinal canal can cause compression of the spinal  cord with subsequent paralysis.  This would require an emergency surgery to  decompress and there are no guarantees that the patient  would recover from the  paralysis.       5. Pneumothorax:  Puncturing of a lung is a possibility, every time a needle is introduced in  the area of the chest or upper back.  Pneumothorax refers to free air around the  collapsed lung(s), inside of the thoracic cavity (chest cavity).  Another two possible  complications related to a similar event would include: Hemothorax and Chylothorax.   These are variations of the Pneumothorax, where instead of air around the collapsed  lung(s), you may have blood or chyle, respectively.       6. Spinal headaches: They may occur with any procedures in the area of the spine.       7. Persistent CSF (Cerebro-Spinal Fluid) leakage: This is a rare problem, but may occur  with prolonged intrathecal or epidural catheters either due to the formation of a fistulous  track or a dural tear.       8. Nerve damage: By working so close to the spinal cord, there is always a possibility of  nerve damage, which could be as serious as a permanent spinal cord injury with  paralysis.       9. Death:  Although rare, severe deadly allergic reactions known as "Anaphylactic  reaction" can occur to any of the medications used.      10. Worsening of the symptoms:  We can always make thing worse.  What are the chances of something like this happening? Chances of any of this occuring are extremely low.  By statistics, you have more of a chance of getting killed in a motor vehicle accident: while driving to the hospital than any of the above occurring .  Nevertheless, you should be aware that they are possibilities.  In general, it is similar to taking a shower.  Everybody knows that you can slip, hit your head and get killed.  Does that mean that you should not shower again?  Nevertheless always keep in mind that statistics do not mean anything if you happen to be on the wrong side of them.  Even if a procedure has a 1 (one) in a 1,000,000 (million) chance of  going wrong, it you happen to be that  one..Also, keep in mind that by statistics, you have more of a chance of having something go wrong when taking medications.  Who should not have this procedure? If you are on a blood thinning medication (e.g. Coumadin, Plavix, see list of "Blood Thinners"), or if you have an active infection going on, you should not have the procedure.  If you are taking any blood thinners, please inform your physician.  How should I prepare for this procedure?  Do not eat or drink anything at least six hours prior to the procedure.  Bring a driver with you .  It cannot be a taxi.  Come accompanied by an adult that can drive you back, and that is strong enough to help you if your legs get weak or numb from the local anesthetic.  Take all of your medicines the morning of the procedure with just enough water to swallow them.  If you have diabetes, make sure that you are scheduled to have your procedure done first thing in the morning, whenever possible.  If you have diabetes, take only half of your insulin dose and notify our nurse that you have done so as soon as you arrive at the clinic.  If you are diabetic, but only take blood sugar pills (oral hypoglycemic), then do not take them on the morning of your procedure.  You may take them after you have had the procedure.  Do not take aspirin or any aspirin-containing medications, at least eleven (11) days prior to the procedure.  They may prolong bleeding.  Wear loose fitting clothing that may be easy to take off and that you would not mind if it got stained with Betadine or blood.  Do not wear any jewelry or perfume  Remove any nail coloring.  It will interfere with some of our monitoring equipment.  NOTE: Remember that this is not meant to be interpreted as a complete list of all possible complications.  Unforeseen problems may occur.  BLOOD THINNERS The following drugs contain aspirin or other products, which can cause increased bleeding during  surgery and should not be taken for 2 weeks prior to and 1 week after surgery.  If you should need take something for relief of minor pain, you may take acetaminophen which is found in Tylenol,m Datril, Anacin-3 and Panadol. It is not blood thinner. The products listed below are.  Do not take any of the products listed below in addition to any listed on your instruction sheet.  A.P.C or A.P.C with Codeine Codeine Phosphate Capsules #3 Ibuprofen Ridaura  ABC compound Congesprin Imuran rimadil  Advil Cope Indocin Robaxisal  Alka-Seltzer Effervescent Pain Reliever and Antacid Coricidin or Coricidin-D  Indomethacin Rufen  Alka-Seltzer plus Cold Medicine Cosprin Ketoprofen S-A-C Tablets  Anacin Analgesic Tablets or Capsules Coumadin Korlgesic Salflex  Anacin Extra Strength Analgesic tablets or capsules CP-2 Tablets Lanoril Salicylate  Anaprox Cuprimine Capsules Levenox Salocol  Anexsia-D Dalteparin Magan Salsalate  Anodynos Darvon compound Magnesium Salicylate Sine-off  Ansaid Dasin Capsules Magsal Sodium Salicylate  Anturane Depen Capsules Marnal Soma  APF Arthritis pain formula Dewitt's Pills Measurin Stanback  Argesic Dia-Gesic Meclofenamic Sulfinpyrazone  Arthritis Bayer Timed Release Aspirin Diclofenac Meclomen Sulindac  Arthritis pain formula Anacin Dicumarol Medipren Supac  Analgesic (Safety coated) Arthralgen Diffunasal Mefanamic Suprofen  Arthritis Strength Bufferin Dihydrocodeine Mepro Compound Suprol  Arthropan liquid Dopirydamole Methcarbomol with Aspirin Synalgos  ASA tablets/Enseals Disalcid Micrainin Tagament  Ascriptin Doan's Midol Talwin  Ascriptin  A/D Dolene Mobidin Tanderil  Ascriptin Extra Strength Dolobid Moblgesic Ticlid  Ascriptin with Codeine Doloprin or Doloprin with Codeine Momentum Tolectin  Asperbuf Duoprin Mono-gesic Trendar  Aspergum Duradyne Motrin or Motrin IB Triminicin  Aspirin plain, buffered or enteric coated Durasal Myochrisine Trigesic  Aspirin  Suppositories Easprin Nalfon Trillsate  Aspirin with Codeine Ecotrin Regular or Extra Strength Naprosyn Uracel  Atromid-S Efficin Naproxen Ursinus  Auranofin Capsules Elmiron Neocylate Vanquish  Axotal Emagrin Norgesic Verin  Azathioprine Empirin or Empirin with Codeine Normiflo Vitamin E  Azolid Emprazil Nuprin Voltaren  Bayer Aspirin plain, buffered or children's or timed BC Tablets or powders Encaprin Orgaran Warfarin Sodium  Buff-a-Comp Enoxaparin Orudis Zorpin  Buff-a-Comp with Codeine Equegesic Os-Cal-Gesic   Buffaprin Excedrin plain, buffered or Extra Strength Oxalid   Bufferin Arthritis Strength Feldene Oxphenbutazone   Bufferin plain or Extra Strength Feldene Capsules Oxycodone with Aspirin   Bufferin with Codeine Fenoprofen Fenoprofen Pabalate or Pabalate-SF   Buffets II Flogesic Panagesic   Buffinol plain or Extra Strength Florinal or Florinal with Codeine Panwarfarin   Buf-Tabs Flurbiprofen Penicillamine   Butalbital Compound Four-way cold tablets Penicillin   Butazolidin Fragmin Pepto-Bismol   Carbenicillin Geminisyn Percodan   Carna Arthritis Reliever Geopen Persantine   Carprofen Gold's salt Persistin   Chloramphenicol Goody's Phenylbutazone   Chloromycetin Haltrain Piroxlcam   Clmetidine heparin Plaquenil   Cllnoril Hyco-pap Ponstel   Clofibrate Hydroxy chloroquine Propoxyphen         Before stopping any of these medications, be sure to consult the physician who ordered them.  Some, such as Coumadin (Warfarin) are ordered to prevent or treat serious conditions such as "deep thrombosis", "pumonary embolisms", and other heart problems.  The amount of time that you may need off of the medication may also vary with the medication and the reason for which you were taking it.  If you are taking any of these medications, please make sure you notify your pain physician before you undergo any procedures.         Pain Management Discharge Instructions  General Discharge  Instructions :  If you need to reach your doctor call: Monday-Friday 8:00 am - 4:00 pm at [phone] or toll free [phone].  After clinic hours [phone] to have operator reach doctor.  Bring all of your medication bottles to all your appointments in the pain clinic.  To cancel or reschedule your appointment with Pain Management please remember to call 24 hours in advance to avoid a fee.  Refer to the educational materials which you have been given on: General Risks, I had my Procedure. Discharge Instructions, Post Sedation.  Post Procedure Instructions:  The drugs you were given will stay in your system until tomorrow, so for the next 24 hours you should not drive, make any legal decisions or drink any alcoholic beverages.  You may eat anything you prefer, but it is better to start with liquids then soups and crackers, and gradually work up to solid foods.  Please notify your doctor immediately if you have any unusual bleeding, trouble breathing or pain that is not related to your normal pain.  Depending on the type of procedure that was done, some parts of your body may feel week and/or numb.  This usually clears up by tonight or the next day.  Walk with the use of an assistive device or accompanied by an adult for the 24 hours.  You may use ice on the affected area for the first 24 hours.  Put ice in  a Ziploc bag and cover with a towel and place against area 15 minutes on 15 minutes off.  You may switch to heat after 24 hours.   Plan of Care   Imaging Orders     DG C-Arm 1-60 Min-No Report Procedure Orders    No procedure(s) ordered today    Medications ordered for procedure: Meds ordered this encounter  Medications  . lactated ringers infusion 1,000 mL  . fentaNYL (SUBLIMAZE) injection 25-100 mcg    Make sure Narcan is available in the pyxis when using this medication. In the event of respiratory depression (RR< 8/min): Titrate NARCAN (naloxone) in increments of  0.1 to 0.2 mg IV at 2-3 minute intervals, until desired degree of reversal.  . iopamidol (ISOVUE-M) 41 % intrathecal injection 10 mL  . ropivacaine (PF) 2 mg/mL (0.2%) (NAROPIN) injection 2 mL  . sodium chloride flush (NS) 0.9 % injection 2 mL  . dexamethasone (DECADRON) injection 10 mg  . lidocaine (PF) (XYLOCAINE) 1 % injection 4.5 mL   Medications administered: We administered lactated ringers, fentaNYL, iopamidol, ropivacaine (PF) 2 mg/mL (0.2%), sodium chloride flush, dexamethasone, and lidocaine (PF).  See the medical record for exact dosing, route, and time of administration.  This SmartLink is deprecated. Use AVSMEDLIST instead to display the medication list for a patient. Disposition: Discharge home  Discharge Date & Time: 11/25/2017; 0950 hrs.   Physician-requested Follow-up: Return in about 2 weeks (around 12/09/2017) for Post Procedure Evaluation. Future Appointments  Date Time Provider [place]  12/03/2017  1:45 PM Gillis Santa, MD [facility] None   Primary Care Physician: Tracie Harrier, MD Location: [facility] Note by: Gillis Santa, MD Date: 11/25/2017; Time: 1:44 PM  Disclaimer:  Medicine is not an exact science. The only guarantee in medicine is that nothing is guaranteed. It is important to note that the decision to proceed with this intervention was based on the information collected from the patient. The Data and conclusions were drawn from the patient's questionnaire, the interview, and the physical examination. Because the information was provided in large part by the patient, it cannot be guaranteed that it has not been purposely or unconsciously manipulated. Every effort has been made to obtain as much relevant data as possible for this evaluation. It is important to note that the conclusions that lead to this procedure are derived in large part from the available data. Always take into account that the treatment will also be  dependent on availability of resources and existing treatment guidelines, considered by other Pain Management Practitioners as being common knowledge and practice, at the time of the intervention. For Medico-Legal purposes, it is also important to point out that variation in procedural techniques and pharmacological choices are the acceptable norm. The indications, contraindications, technique, and results of the above procedure should only be interpreted and judged by a Board-Certified Interventional Pain Specialist with extensive familiarity and expertise in the same exact procedure and technique.

## 2017-11-26 ENCOUNTER — Telehealth: Payer: Self-pay | Admitting: Student in an Organized Health Care Education/Training Program

## 2017-11-26 NOTE — Telephone Encounter (Signed)
Spoke with patient re; s/s.  Explained steroids and how they can elevate sugar as well as BP and that the steroids need a couple of days before helping the pain.  I did discuss with Dr Holley Raring and he was in agreement to continue BP meds as directed and see how things go.  Patient denies headache, blurred vision, chest pain or dizziness.  If s/s worsen he will be back in contact with [place] if s/s level out we will discuss at next visit.

## 2017-11-26 NOTE — Telephone Encounter (Addendum)
Patient having significant pain and would like to know what to do about it. Had procedure 11-25-14 BP is 170/95  Sugar is 430 Please call asap.

## 2017-12-03 ENCOUNTER — Ambulatory Visit: Payer: Medicare Other | Admitting: Student in an Organized Health Care Education/Training Program

## 2017-12-09 ENCOUNTER — Ambulatory Visit: Payer: Medicare Other | Admitting: Pain Medicine

## 2017-12-11 ENCOUNTER — Ambulatory Visit
Payer: Medicare Other | Attending: Pain Medicine | Admitting: Student in an Organized Health Care Education/Training Program

## 2017-12-11 ENCOUNTER — Other Ambulatory Visit: Payer: Self-pay

## 2017-12-11 ENCOUNTER — Encounter: Payer: Self-pay | Admitting: Student in an Organized Health Care Education/Training Program

## 2017-12-11 VITALS — BP 113/70 | HR 91 | Temp 97.8°F | Resp 16 | Ht 73.5 in | Wt 185.0 lb

## 2017-12-11 DIAGNOSIS — Z7982 Long term (current) use of aspirin: Secondary | ICD-10-CM | POA: Diagnosis not present

## 2017-12-11 DIAGNOSIS — M5416 Radiculopathy, lumbar region: Secondary | ICD-10-CM | POA: Diagnosis not present

## 2017-12-11 DIAGNOSIS — M51369 Other intervertebral disc degeneration, lumbar region without mention of lumbar back pain or lower extremity pain: Secondary | ICD-10-CM

## 2017-12-11 DIAGNOSIS — Z888 Allergy status to other drugs, medicaments and biological substances status: Secondary | ICD-10-CM | POA: Insufficient documentation

## 2017-12-11 DIAGNOSIS — Z86010 Personal history of colonic polyps: Secondary | ICD-10-CM | POA: Diagnosis not present

## 2017-12-11 DIAGNOSIS — M9983 Other biomechanical lesions of lumbar region: Secondary | ICD-10-CM | POA: Diagnosis not present

## 2017-12-11 DIAGNOSIS — D649 Anemia, unspecified: Secondary | ICD-10-CM | POA: Diagnosis not present

## 2017-12-11 DIAGNOSIS — J449 Chronic obstructive pulmonary disease, unspecified: Secondary | ICD-10-CM | POA: Insufficient documentation

## 2017-12-11 DIAGNOSIS — Z79899 Other long term (current) drug therapy: Secondary | ICD-10-CM | POA: Insufficient documentation

## 2017-12-11 DIAGNOSIS — E1021 Type 1 diabetes mellitus with diabetic nephropathy: Secondary | ICD-10-CM | POA: Insufficient documentation

## 2017-12-11 DIAGNOSIS — F329 Major depressive disorder, single episode, unspecified: Secondary | ICD-10-CM | POA: Insufficient documentation

## 2017-12-11 DIAGNOSIS — M5136 Other intervertebral disc degeneration, lumbar region: Secondary | ICD-10-CM | POA: Diagnosis not present

## 2017-12-11 DIAGNOSIS — Z9889 Other specified postprocedural states: Secondary | ICD-10-CM | POA: Insufficient documentation

## 2017-12-11 DIAGNOSIS — C61 Malignant neoplasm of prostate: Secondary | ICD-10-CM | POA: Insufficient documentation

## 2017-12-11 DIAGNOSIS — G894 Chronic pain syndrome: Secondary | ICD-10-CM

## 2017-12-11 DIAGNOSIS — Z8249 Family history of ischemic heart disease and other diseases of the circulatory system: Secondary | ICD-10-CM | POA: Insufficient documentation

## 2017-12-11 DIAGNOSIS — M48061 Spinal stenosis, lumbar region without neurogenic claudication: Secondary | ICD-10-CM

## 2017-12-11 DIAGNOSIS — I10 Essential (primary) hypertension: Secondary | ICD-10-CM | POA: Insufficient documentation

## 2017-12-11 DIAGNOSIS — M170 Bilateral primary osteoarthritis of knee: Secondary | ICD-10-CM | POA: Diagnosis not present

## 2017-12-11 DIAGNOSIS — M5116 Intervertebral disc disorders with radiculopathy, lumbar region: Secondary | ICD-10-CM | POA: Insufficient documentation

## 2017-12-11 DIAGNOSIS — Z87442 Personal history of urinary calculi: Secondary | ICD-10-CM | POA: Diagnosis not present

## 2017-12-11 DIAGNOSIS — E1010 Type 1 diabetes mellitus with ketoacidosis without coma: Secondary | ICD-10-CM | POA: Insufficient documentation

## 2017-12-11 DIAGNOSIS — H409 Unspecified glaucoma: Secondary | ICD-10-CM | POA: Insufficient documentation

## 2017-12-11 DIAGNOSIS — G4700 Insomnia, unspecified: Secondary | ICD-10-CM | POA: Insufficient documentation

## 2017-12-11 DIAGNOSIS — E785 Hyperlipidemia, unspecified: Secondary | ICD-10-CM | POA: Insufficient documentation

## 2017-12-11 DIAGNOSIS — Z79891 Long term (current) use of opiate analgesic: Secondary | ICD-10-CM | POA: Diagnosis not present

## 2017-12-11 DIAGNOSIS — Z794 Long term (current) use of insulin: Secondary | ICD-10-CM | POA: Diagnosis not present

## 2017-12-11 DIAGNOSIS — N400 Enlarged prostate without lower urinary tract symptoms: Secondary | ICD-10-CM | POA: Insufficient documentation

## 2017-12-11 DIAGNOSIS — E1042 Type 1 diabetes mellitus with diabetic polyneuropathy: Secondary | ICD-10-CM | POA: Insufficient documentation

## 2017-12-11 MED ORDER — HYDROCODONE-ACETAMINOPHEN 7.5-325 MG PO TABS: 1.0000 | ORAL_TABLET | Freq: Three times a day (TID) | ORAL | 0 refills | Status: DC | PRN

## 2017-12-11 MED ORDER — MELOXICAM 7.5 MG PO TABS: 7.5000 mg | ORAL_TABLET | Freq: Every day | ORAL | 3 refills | Status: DC

## 2017-12-11 NOTE — Progress Notes (Signed)
Patient's Name: Dylan Quinn  MRN: [id number]  Referring Provider: Tracie Harrier, MD  DOB: 25-May-1936  PCP: Tracie Harrier, MD  DOS: 12/11/2017  Note by: Gillis Santa, MD  Service setting: Ambulatory outpatient  Specialty: Interventional Pain Management  Location: [facility] (AMB) Pain Management Facility    Patient type: Established   Primary Reason(s) for Visit: Encounter for post-procedure evaluation of chronic illness with mild to moderate exacerbation CC: Back Pain (lower left is worse) and Knee Pain (right)  HPI  Mr. Lumpkin is a 81 y.o. year old, male patient, who comes today for a post-procedure evaluation. 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 ([place]); PERIPHERAL NEUROPATHY; Glaucoma; HEARING LOSS; Essential hypertension; Prostate cancer ([place]); Other osteoporosis; PROTEINURIA, MILD; Insomnia; Normocytic anemia; URI (upper respiratory infection); Type 1 diabetes mellitus with nephropathy ([place]); COPD (chronic obstructive pulmonary disease) ([place]); Right knee pain; Primary osteoarthritis of right knee; Diabetic ketoacidosis without coma associated with type 1 diabetes mellitus ([place]); Nausea & vomiting; DKA, type 1 ([place]); Influenza with pneumonia; Staphylococcus aureus bacteremia; Rash and nonspecific skin eruption; Lumbar radiculopathy; Lumbar degenerative disc disease; and Foraminal stenosis of lumbar region on their problem list. His primarily concern today is the Back Pain (lower left is worse) and Knee Pain (right)  Pain Assessment: Location: Left, Lower(bilateral) Back(knee) Radiating: na Onset: More than a month ago Duration: Chronic pain Quality: Aching, Constant Severity: 2 /10 (self-reported pain score)  Note: Reported level is compatible with observation.                         When using our objective Pain Scale, levels between 6 and 10/10 are said to belong in an emergency room, as it progressively worsens from a 6/10, described as severely limiting,  requiring emergency care not usually available at an outpatient pain management facility. At a 6/10 level, communication becomes difficult and requires great effort. Assistance to reach the emergency department may be required. Facial flushing and profuse sweating along with potentially dangerous increases in heart rate and blood pressure will be evident. Effect on ADL: pain intensifies with activity Timing: Constant Modifying factors: pain medication and tylenol  Mr. Hoben comes in today for post-procedure evaluation after the treatment done on 11/26/2017.  Further details on both, my assessment(s), as well as the proposed treatment plan, please see below.  Post-Procedure Assessment  11/26/2017 Procedure:caudal ESI Pre-procedure pain score:  6/10 Post-procedure pain score: 0/10         Influential Factors: BMI: 24.08 kg/m Intra-procedural challenges: None observed.         Assessment challenges: None detected.              Reported side-effects: None.        Post-procedural adverse reactions or complications: None reported         Sedation: Please see nurses note. When no sedatives are used, the analgesic levels obtained are directly associated to the effectiveness of the local anesthetics. However, when sedation is provided, the level of analgesia obtained during the initial 1 hour following the intervention, is believed to be the result of a combination of factors. These factors may include, but are not limited to: 1. The effectiveness of the local anesthetics used. 2. The effects of the analgesic(s) and/or anxiolytic(s) used. 3. The degree of discomfort experienced by the patient at the time of the procedure. 4. The patients ability and reliability in recalling and recording the events. 5. The presence and influence of possible  secondary gains and/or psychosocial factors. Reported result: Relief experienced during the 1st hour after the procedure: 100 % (Ultra-Short Term Relief)             Interpretative annotation: Clinically appropriate result. Analgesia during this period is likely to be Local Anesthetic and/or IV Sedative (Analgesic/Anxiolytic) related.          Effects of local anesthetic: The analgesic effects attained during this period are directly associated to the localized infiltration of local anesthetics and therefore cary significant diagnostic value as to the etiological location, or anatomical origin, of the pain. Expected duration of relief is directly dependent on the pharmacodynamics of the local anesthetic used. Long-acting (4-6 hours) anesthetics used.  Reported result: Relief during the next 4 to 6 hour after the procedure: 100 % (Short-Term Relief)            Interpretative annotation: Clinically appropriate result. Analgesia during this period is likely to be Local Anesthetic-related.          Long-term benefit: Defined as the period of time past the expected duration of local anesthetics (1 hour for short-acting and 4-6 hours for long-acting). With the possible exception of prolonged sympathetic blockade from the local anesthetics, benefits during this period are typically attributed to, or associated with, other factors such as analgesic sensory neuropraxia, antiinflammatory effects, or beneficial biochemical changes provided by agents other than the local anesthetics.  Reported result: Extended relief following procedure: 0 %(over the last few days the pain has become intense ) (Long-Term Relief)            Interpretative annotation: Unexpected result. No long-term benefit. Incomplete therapeutic success. Inflammation plays a part in the etiology to the pain.          Current benefits: Defined as reported results that persistent at this point in time.   Analgesia: 0-25 %            Function: Back to baseline ROM: Back to baseline Interpretative annotation: Recurrence of symptoms. No permanent benefit expected. Results would suggest persistent aggravating  factors.          Interpretation: Results would suggest failure of therapy in achieving desired goal(s).                  Plan:  Please see "Plan of Care" for details.        Laboratory Chemistry  Inflammation Markers (CRP: Acute Phase) (ESR: Chronic Phase) Lab Results  Component Value Date   ESRSEDRATE 77 (H) 12/15/2016                 Rheumatology Markers No results found for: RF, ANA, LABURIC, URICUR, LYMEIGGIGMAB, [facility]              Renal Function Markers Lab Results  Component Value Date   BUN 16 12/23/2016   CREATININE 1.30 (H) 09/17/2017   GFRAA >60 12/23/2016   GFRNONAA 56 (L) 12/23/2016                 Hepatic Function Markers Lab Results  Component Value Date   AST 15 12/19/2016   ALT 10 (L) 12/19/2016   ALBUMIN 2.4 (L) 12/19/2016   ALKPHOS 72 12/19/2016   AMYLASE 32 12/27/2010   LIPASE 10 (L) 12/18/2016                 Electrolytes Lab Results  Component Value Date   NA 130 (L) 12/23/2016   K 4.9 12/23/2016   CL 98 (L)  12/23/2016   CALCIUM 7.9 (L) 12/23/2016   MG 1.8 12/18/2016                 Neuropathy Markers Lab Results  Component Value Date   VITAMINB12 551 12/29/2015   FOLATE 8.2 12/29/2015   HGBA1C 10.4 10/09/2016   HIV Non Reactive 12/15/2016                 Bone Pathology Markers Lab Results  Component Value Date   TESTOSTERONE 300.16 (L) 11/24/2010                 Coagulation Parameters Lab Results  Component Value Date   PLT 462 (H) 12/23/2016                 Cardiovascular Markers Lab Results  Component Value Date   BNP 201.8 (H) 12/15/2016   TROPONINI 0.04 (HH) 12/11/2016   HGB 9.9 (L) 12/23/2016   HCT 29.1 (L) 12/23/2016                 CA Markers No results found for: CEA, CA125, LABCA2               Note: Lab results reviewed.   Meds   Current Outpatient Medications:  .  aspirin (GOODSENSE ASPIRIN) 81 MG chewable tablet, Chew 81 mg by mouth daily. , Disp: , Rfl:  .  brimonidine-timolol (COMBIGAN)  0.2-0.5 % ophthalmic solution, Place 1 drop into both eyes 2 (two) times daily. , Disp: , Rfl:  .  clobetasol cream (TEMOVATE) 2.45 %, Apply 1 application topically 2 (two) times daily as needed (for rash). , Disp: , Rfl:  .  Docusate Sodium (COLACE PO), Take 2 capsules by mouth as needed., Disp: , Rfl:  .  dorzolamide (TRUSOPT) 2 % ophthalmic solution, Place 1 drop into both eyes 2 (two) times daily., Disp: , Rfl:  .  hydrochlorothiazide (MICROZIDE) 12.5 MG capsule, Take 1 capsule by mouth daily., Disp: , Rfl:  .  Insulin Glargine (LANTUS SOLOSTAR) 100 UNIT/ML Solostar Pen, Inject 11 Units into the skin daily. , Disp: , Rfl:  .  insulin lispro (HUMALOG) 100 UNIT/ML injection, Inject 0-10 Units into the skin 3 (three) times daily before meals. + sliding scale if BG>150, Disp: , Rfl:  .  latanoprost (XALATAN) 0.005 % ophthalmic solution, Place 1 drop into both eyes at bedtime., Disp: , Rfl:  .  levocetirizine (XYZAL) 5 MG tablet, Take 1 tablet by mouth daily. In evening, Disp: , Rfl:  .  meloxicam (MOBIC) 7.5 MG tablet, Take 1 tablet (7.5 mg total) by mouth daily., Disp: 30 tablet, Rfl: 3 .  olmesartan (BENICAR) 40 MG tablet, Take 1 tablet (40 mg total) by mouth daily., Disp: 90 tablet, Rfl: 3 .  potassium chloride SA (K-DUR,KLOR-CON) 20 MEQ tablet, Take 2 tablets (40 mEq total) by mouth daily., Disp: 30 tablet, Rfl: 2 .  pregabalin (LYRICA) 100 MG capsule, Take 1 capsule (100 mg total) by mouth 2 (two) times daily., Disp: 180 capsule, Rfl: 1 .  colchicine 0.6 MG tablet, Take 2 tablets by mouth. 2 tablets at first sign of gout flare followed by 1, Disp: , Rfl:  .  Cyanocobalamin (B-12 IJ), Inject 1 mcg as directed every 30 (thirty) days., Disp: , Rfl:  .  glucagon (GLUCAGON EMERGENCY) 1 MG injection, Inject 1 mg into the muscle once as needed. (Patient not taking: Reported on 10/17/2017), Disp: 2 each, Rfl: 1 .  HYDROcodone-acetaminophen (NORCO) 7.5-325 MG tablet,  Take 1 tablet by mouth 3 (three)  times daily as needed for moderate pain., Disp: 90 tablet, Rfl: 0 .  polyethylene glycol (MIRALAX / GLYCOLAX) packet, Take 17 g by mouth daily. (Patient not taking: Reported on 11/20/2017), Disp: 14 each, Rfl: 0 .  traMADol (ULTRAM) 50 MG tablet, Take 1 tablet (50 mg total) by mouth every 6 (six) hours as needed for moderate pain or severe pain. (Patient not taking: Reported on 10/17/2017), Disp: 30 tablet, Rfl: 0  ROS  Constitutional: Denies any fever or chills Gastrointestinal: No reported hemesis, hematochezia, vomiting, or acute GI distress Musculoskeletal: Denies any acute onset joint swelling, redness, loss of ROM, or weakness Neurological: No reported episodes of acute onset apraxia, aphasia, dysarthria, agnosia, amnesia, paralysis, loss of coordination, or loss of consciousness  Allergies  Mr. Heritage is allergic to lisinopril.  [place]  Drug: Mr. Mckim  reports that he does not use drugs. Alcohol:  reports that he does not drink alcohol. Tobacco:  reports that  has never smoked. he has never used smokeless tobacco. Medical:  has a past medical history of BENIGN PROSTATIC HYPERTROPHY (07/19/2009), COLONIC POLYPS (02/14/2010), DEPRESSION (03/16/2008), DIABETES MELLITUS, TYPE I (06/30/2007), Dyslipidemia, FOLLICULITIS (01/31/3661), GLAUCOMA (07/19/2009), HEARING LOSS (02/14/2010), HYPERTENSION (05/14/2008), HYPOGONADISM, MALE (12/16/2007), Leukopenia, LUMBAR RADICULOPATHY, LEFT (02/23/2010), Other osteoporosis (12/16/2007), PERIPHERAL NEUROPATHY (06/30/2007), Prostate cancer ([place]), PROTEINURIA, MILD (02/14/2010), Psoriasis, and URINARY CALCULUS (12/27/2010). Surgical: Mr. Harr  has a past surgical history that includes Cataract extraction (2006); Cataract extraction (1997); Appendectomy (1986); and TEE without cardioversion (N/A, 12/19/2016). Family: family history includes Cancer in his father; Heart disease in his father; High blood pressure in his unknown relative.  Constitutional Exam  General  appearance: Well nourished, well developed, and well hydrated. In no apparent acute distress Vitals:   12/11/17 1426  BP: 113/70  Pulse: 91  Resp: 16  Temp: 97.8 F (36.6 C)  TempSrc: Oral  SpO2: 100%  Weight: 185 lb (83.9 kg)  Height: 6' 1.5" (1.867 m)   BMI Assessment: Estimated body mass index is 24.08 kg/m as calculated from the following:   Height as of this encounter: 6' 1.5" (1.867 m).   Weight as of this encounter: 185 lb (83.9 kg).  BMI interpretation table: BMI level Category Range association with higher incidence of chronic pain  <18 kg/m2 Underweight   18.5-24.9 kg/m2 Ideal body weight   25-29.9 kg/m2 Overweight Increased incidence by 20%  30-34.9 kg/m2 Obese (Class I) Increased incidence by 68%  35-39.9 kg/m2 Severe obesity (Class II) Increased incidence by 136%  >40 kg/m2 Extreme obesity (Class III) Increased incidence by 254%   BMI Readings from Last 4 Encounters:  12/11/17 24.08 kg/m  11/25/17 26.08 kg/m  11/20/17 25.66 kg/m  10/17/17 24.55 kg/m   Wt Readings from Last 4 Encounters:  12/11/17 185 lb (83.9 kg)  11/25/17 187 lb (84.8 kg)  11/20/17 184 lb (83.5 kg)  10/17/17 176 lb (79.8 kg)  Psych/Mental status: Alert, oriented x 3 (person, place, & time)       Eyes: PERLA Respiratory: No evidence of acute respiratory distress  Cervical Spine Area Exam  Skin & Axial Inspection: No masses, redness, edema, swelling, or associated skin lesions Alignment: Symmetrical Functional ROM: Unrestricted ROM      Stability: No instability detected Muscle Tone/Strength: Functionally intact. No obvious neuro-muscular anomalies detected. Sensory (Neurological): Unimpaired Palpation: No palpable anomalies              Upper Extremity (UE) Exam    Side: Right upper  extremity  Side: Left upper extremity  Skin & Extremity Inspection: Skin color, temperature, and hair growth are WNL. No peripheral edema or cyanosis. No masses, redness, swelling, asymmetry, or  associated skin lesions. No contractures.  Skin & Extremity Inspection: Skin color, temperature, and hair growth are WNL. No peripheral edema or cyanosis. No masses, redness, swelling, asymmetry, or associated skin lesions. No contractures.  Functional ROM: Unrestricted ROM          Functional ROM: Unrestricted ROM          Muscle Tone/Strength: Functionally intact. No obvious neuro-muscular anomalies detected.  Muscle Tone/Strength: Functionally intact. No obvious neuro-muscular anomalies detected.  Sensory (Neurological): Unimpaired          Sensory (Neurological): Unimpaired          Palpation: No palpable anomalies              Palpation: No palpable anomalies              Specialized Test(s): Deferred         Specialized Test(s): Deferred          Thoracic Spine Area Exam  Skin & Axial Inspection: No masses, redness, or swelling Alignment: Symmetrical Functional ROM: Unrestricted ROM Stability: No instability detected Muscle Tone/Strength: Functionally intact. No obvious neuro-muscular anomalies detected. Sensory (Neurological): Unimpaired Muscle strength & Tone: No palpable anomalies Lumbar Spine Area Exam  Skin & Axial Inspection:Paravertebral muscle atrophy Alignment:Asymmetric Functional ZOX:WRUEAVWUJ ROM Stability:No instability detected Muscle Tone/Strength:Functionally intact. No obvious neuro-muscular anomalies detected. Sensory (Neurological):Movement-associated pain Palpation:Complains of area being tender to palpationBilateral Fist Percussion Test Provocative Tests: Lumbar Hyperextension and rotation test:Positivebilaterally for facet joint pain. Lumbar Lateral bending test:Positivedue to pain. Patrick's Maneuver:evaluation deferred today  Gait & Posture Assessment  Ambulation:Limited Gait:Antalgic Posture:Difficulty standing up straight, due to pain  Lower Extremity Exam    Side:Right lower extremity  Side:Left lower  extremity  Skin & Extremity Inspection:Skin color, temperature, and hair growth are WNL. No peripheral edema or cyanosis. No masses, redness, swelling, asymmetry, or associated skin lesions. No contractures.  Skin & Extremity Inspection:Skin color, temperature, and hair growth are WNL. No peripheral edema or cyanosis. No masses, redness, swelling, asymmetry, or associated skin lesions. No contractures.  Functional WJX:[id number] ROM  Functional YQM:VHQIONGEXBMW ROM  Muscle Tone/Strength:Functionally intact. No obvious neuro-muscular anomalies detected.  Muscle Tone/Strength:Functionally intact. No obvious neuro-muscular anomalies detected.  Sensory (Neurological):Unimpaired  Sensory (Neurological):Unimpaired  Palpation:No palpable anomalies  Palpation:No palpable anomalies     Assessment  Primary Diagnosis & Pertinent Problem List: The primary encounter diagnosis was Bilateral primary osteoarthritis of knee. Diagnoses of Lumbar radiculopathy, Foraminal stenosis of lumbar region, Lumbar degenerative disc disease, and Chronic pain syndrome were also pertinent to this visit.  Status Diagnosis  Persistent Persistent Persistent 1. Bilateral primary osteoarthritis of knee   2. Lumbar radiculopathy   3. Foraminal stenosis of lumbar region   4. Lumbar degenerative disc disease   5. Chronic pain syndrome      81 year old male with a history of type 1 diabetes who presents with axial low back pain with radiation to bilateral lower extremities thathasworsened over the last 2-4 months. Low back pain with radiation to his legs could be secondary to multilevel lumbar spondylosis, facet arthropathy, subarticular stenosis most pronounced at L4-L5 with mild disc bulges and mild lateral recess stenosis at L2-L3 and L3-L4.  Patient returns for follow-up status post caudal epidural steroid injection performed on November 25, 2017.  Patient notes improvement in radicular pain  symptoms  for only 2-3 days after the injection.  This would suggest a negative diagnostic block.  Patient also states that he had difficulties controlling his blood sugars and his blood pressure 1-2 days after the block.  Given that the patient is a diabetic, I do not see any utility in repeating another caudal for this patient.  Patient continues to endorse bilateral knee pain secondary to bilateral knee osteoarthritis.  He has had intra-articular steroid injections which were not effective.  He has not had genicular nerve block.  We discussed the risks and benefits of this procedure and the patient would like to proceed.  In regards to medication management, I will have the patient complete an opioid agreement with our clinic and also obtain a urine drug screen today.  Patient has been taken hydrocodone 5 mg twice daily to 3 times daily as needed which he finds effective but notes dose failure 2-3 hours after intake of medication.  I will have the patient increase his hydrocodone to 7.5 mg 3 times daily as needed.  We will also refill his Mobic 7.5 mg which he can take daily.  Furthermore I feel that the patient would benefit from home health physical therapy as he is very debilitated and has difficulty ambulating and leaving the house.  This will help with his lumbar paraspinal muscle strengthening and range of motion.  Plan: -Do not recommend any additional caudal injections at this time -Schedule bilateral genicular nerve block for bilateral knee osteoarthritis -Signed opioid agreement today -UDS today -Prescription for hydrocodone 7.5 mg 3 times daily as needed, Mobic 7.5 mg daily -Home health referral for PT.   Plan of Care  Pharmacotherapy (Medications Ordered): Meds ordered this encounter  Medications  . meloxicam (MOBIC) 7.5 MG tablet    Sig: Take 1 tablet (7.5 mg total) by mouth daily.    Dispense:  30 tablet    Refill:  3  . HYDROcodone-acetaminophen (NORCO) 7.5-325 MG tablet     Sig: Take 1 tablet by mouth 3 (three) times daily as needed for moderate pain.    Dispense:  90 tablet    Refill:  0    Do not place this medication, or any other prescription from our practice, on "Automatic Refill". Patient may have prescription filled one day early if pharmacy is closed on scheduled refill date. Do not fill until:  To last until:   Lab-work, procedure(s), and/or referral(s): Orders Placed This Encounter  Procedures  . GENICULAR NERVE BLOCK  . Compliance Drug Analysis, Ur  . Ambulatory referral to Home Health    Time Note: Greater than 50% of the 25 minute(s) of face-to-face time spent with Mr. Reffner, was spent in counseling/coordination of care regarding: the treatment plan, treatment alternatives, the risks and possible complications of proposed treatment, the opioid analgesic risks and possible complications, the results, interpretation and significance of  his recent diagnostic interventional treatment(s), the medication agreement and the patient's responsibilities when it comes to controlled substances. Provider-requested follow-up: Return for Procedure.  Future Appointments  Date Time Provider [place]  12/18/2017  9:45 AM Gillis Santa, MD [facility] None    Primary Care Physician: Tracie Harrier, MD Location: [facility] Note by: Gillis Santa, M.D Date: 12/11/2017; Time: 4:14 PM  Patient Instructions  1. Sign opioid agreement 2. Rx for Norco 7.5 mg up to three times a day as needed  3. Continue Mobic 7.5 mg daily 4. Schedule for bilateral genicular nerve block with sedation 5. Follow up  for procedure  ____________________________________________________________________________________________  Preparing for Procedure with Sedation Instructions: . Oral Intake: Do not eat or drink anything for at least 8 hours prior to your procedure. . Transportation: Public transportation is not allowed. Bring an adult  driver. The driver must be physically present in our waiting room before any procedure can be started. [place] Physical Assistance: Bring an adult physically capable of assisting you, in the event you need help. This adult should keep you company at home for at least 6 hours after the procedure. . Blood Pressure Medicine: Take your blood pressure medicine with a sip of water the morning of the procedure. . Blood thinners:  . Diabetics on insulin: Notify the staff so that you can be scheduled 1st case in the morning. If your diabetes requires high dose insulin, take only  of your normal insulin dose the morning of the procedure and notify the staff that you have done so. . Preventing infections: Shower with an antibacterial soap the morning of your procedure. . Build-up your immune system: Take 1000 mg of Vitamin C with every meal (3 times a day) the day prior to your procedure. [place] Antibiotics: Inform the staff if you have a condition or reason that requires you to take antibiotics before dental procedures. . Pregnancy: If you are pregnant, call and cancel the procedure. . Sickness: If you have a cold, fever, or any active infections, call and cancel the procedure. . Arrival: You must be in the facility at least 30 minutes prior to your scheduled procedure. . Children: Do not bring children with you. . Dress appropriately: Bring dark clothing that you would not mind if they get stained. . Valuables: Do not bring any jewelry or valuables. Procedure appointments are reserved for interventional treatments only. [place] No Prescription Refills. . No medication changes will be discussed during procedure appointments. . No disability issues will be discussed. ____________________________________________________________________________________________

## 2017-12-11 NOTE — Patient Instructions (Addendum)
1. Sign opioid agreement 2. Rx for Norco 7.5 mg up to three times a day as needed  3. Continue Mobic 7.5 mg daily 4. Schedule for bilateral genicular nerve block with sedation 5. Follow up for procedure  ____________________________________________________________________________________________  Preparing for Procedure with Sedation Instructions: . Oral Intake: Do not eat or drink anything for at least 8 hours prior to your procedure. . Transportation: Public transportation is not allowed. Bring an adult driver. The driver must be physically present in our waiting room before any procedure can be started. Marland Kitchen Physical Assistance: Bring an adult physically capable of assisting you, in the event you need help. This adult should keep you company at home for at least 6 hours after the procedure. . Blood Pressure Medicine: Take your blood pressure medicine with a sip of water the morning of the procedure. . Blood thinners:  . Diabetics on insulin: Notify the staff so that you can be scheduled 1st case in the morning. If your diabetes requires high dose insulin, take only  of your normal insulin dose the morning of the procedure and notify the staff that you have done so. . Preventing infections: Shower with an antibacterial soap the morning of your procedure. . Build-up your immune system: Take 1000 mg of Vitamin C with every meal (3 times a day) the day prior to your procedure. Marland Kitchen Antibiotics: Inform the staff if you have a condition or reason that requires you to take antibiotics before dental procedures. . Pregnancy: If you are pregnant, call and cancel the procedure. . Sickness: If you have a cold, fever, or any active infections, call and cancel the procedure. . Arrival: You must be in the facility at least 30 minutes prior to your scheduled procedure. . Children: Do not bring children with you. . Dress appropriately: Bring dark clothing that you would not mind if they get  stained. . Valuables: Do not bring any jewelry or valuables. Procedure appointments are reserved for interventional treatments only. Marland Kitchen No Prescription Refills. . No medication changes will be discussed during procedure appointments. . No disability issues will be discussed. ____________________________________________________________________________________________

## 2017-12-11 NOTE — Progress Notes (Signed)
Safety precautions to be maintained throughout the outpatient stay will include: orient to surroundings, keep bed in low position, maintain call bell within reach at all times, provide assistance with transfer out of bed and ambulation.  

## 2017-12-12 ENCOUNTER — Telehealth: Payer: Self-pay | Admitting: Student in an Organized Health Care Education/Training Program

## 2017-12-12 NOTE — Telephone Encounter (Signed)
[facility] lvmail asking to verify Script. Patient recently had script filled. Please call ASAP

## 2017-12-13 NOTE — Telephone Encounter (Signed)
Spoke with [facility].  States patient has gotten Hydrocodone 5/325 mg #60 from Dr Ginette Pitman.  Called Dr Holley Raring and was told to cancel prescription that we have given him  Of Hydrocodone 7.5/325 mg and do not fill it.  Called Cristy at [facility] and notified her not to fill script from [place].

## 2017-12-16 DIAGNOSIS — G4733 Obstructive sleep apnea (adult) (pediatric): Secondary | ICD-10-CM | POA: Diagnosis not present

## 2017-12-17 LAB — COMPLIANCE DRUG ANALYSIS, UR

## 2017-12-18 ENCOUNTER — Encounter: Payer: Self-pay | Admitting: Student in an Organized Health Care Education/Training Program

## 2017-12-18 ENCOUNTER — Ambulatory Visit (HOSPITAL_BASED_OUTPATIENT_CLINIC_OR_DEPARTMENT_OTHER): Payer: Medicare Other | Admitting: Student in an Organized Health Care Education/Training Program

## 2017-12-18 ENCOUNTER — Ambulatory Visit
Admission: RE | Admit: 2017-12-18 | Discharge: 2017-12-18 | Disposition: A | Discharge status: Home / Self Care | Payer: Medicare Other | Source: Ambulatory Visit | Attending: Student in an Organized Health Care Education/Training Program | Admitting: Student in an Organized Health Care Education/Training Program

## 2017-12-18 VITALS — BP 159/86 | HR 70 | Temp 97.9°F | Resp 14 | Ht 71.5 in | Wt 184.0 lb

## 2017-12-18 DIAGNOSIS — M170 Bilateral primary osteoarthritis of knee: Secondary | ICD-10-CM

## 2017-12-18 DIAGNOSIS — Z7982 Long term (current) use of aspirin: Secondary | ICD-10-CM | POA: Insufficient documentation

## 2017-12-18 DIAGNOSIS — G894 Chronic pain syndrome: Secondary | ICD-10-CM | POA: Insufficient documentation

## 2017-12-18 DIAGNOSIS — Z794 Long term (current) use of insulin: Secondary | ICD-10-CM | POA: Diagnosis not present

## 2017-12-18 DIAGNOSIS — Z79899 Other long term (current) drug therapy: Secondary | ICD-10-CM | POA: Insufficient documentation

## 2017-12-18 MED ORDER — FENTANYL CITRATE (PF) 100 MCG/2ML IJ SOLN
25.0000 ug | INTRAMUSCULAR | Status: DC | PRN
Start: 2017-12-18 — End: 2017-12-18
  Administered 2017-12-18: 75 ug via INTRAVENOUS
  Filled 2017-12-18: qty 2

## 2017-12-18 MED ORDER — LIDOCAINE HCL (PF) 1 % IJ SOLN
10.0000 mL | Freq: Once | INTRAMUSCULAR | Status: DC
  Filled 2017-12-18: qty 10

## 2017-12-18 MED ORDER — DEXAMETHASONE SODIUM PHOSPHATE 10 MG/ML IJ SOLN
10.0000 mg | Freq: Once | INTRAMUSCULAR | Status: AC
  Administered 2017-12-18: 10 mg
  Filled 2017-12-18: qty 1

## 2017-12-18 MED ORDER — ROPIVACAINE HCL 2 MG/ML IJ SOLN
10.0000 mL | Freq: Once | INTRAMUSCULAR | Status: AC
  Administered 2017-12-18: 10 mL
  Filled 2017-12-18: qty 10

## 2017-12-18 MED ORDER — LACTATED RINGERS IV SOLN: 1000.0000 mL | Freq: Once | INTRAVENOUS | Status: DC

## 2017-12-18 NOTE — Progress Notes (Signed)
Safety precautions to be maintained throughout the outpatient stay will include: orient to surroundings, keep bed in low position, maintain call bell within reach at all times, provide assistance with transfer out of bed and ambulation.  

## 2017-12-18 NOTE — Patient Instructions (Signed)

## 2017-12-18 NOTE — Progress Notes (Signed)
Patient's Name: Dylan Quinn  MRN: [id number]  Referring Provider: Tracie Harrier, MD  DOB: [birth year]  PCP: Tracie Harrier, MD  DOS: 12/18/2017  Note by: Gillis Santa, MD  Service setting: Ambulatory outpatient  Specialty: Interventional Pain Management  Patient type: Established  Location: [facility] (AMB) Pain Management Facility  Visit type: Interventional Procedure   Primary Reason for Visit: Interventional Pain Management Treatment. CC: Knee Pain (BILATERAL)  Procedure:  Anesthesia, Analgesia, Anxiolysis:  Type: Therapeutic Superior-lateral, Superior-medial, and Inferior-medial, Genicular Nerves Block. (CPT [phone]) Region: Lateral, Anterior, and Medial aspects of the knee joint, above and below the knee joint proper. Level: Superior and inferior to the knee joint. Laterality: Bilateral  Type: Local Anesthesia with Moderate (Conscious) Sedation Local Anesthetic: Lidocaine 1% Route: Intravenous (IV) IV Access: Secured Sedation: Meaningful verbal contact was maintained at all times during the procedure  Indication(s): Analgesia and Anxiety   Indications: 1. Bilateral primary osteoarthritis of knee   2. Chronic pain syndrome    Pain Score: Pre-procedure: 7 /10 Post-procedure: 0-No pain/10  Pre-op Assessment:  Dylan Quinn is a 81 y.o. (year old), male patient, seen today for interventional treatment. He  has a past surgical history that includes Cataract extraction (2006); Cataract extraction (1997); Appendectomy (1986); and TEE without cardioversion (N/A, 12/19/2016). Dylan Quinn has a current medication list which includes the following prescription(s): aspirin, brimonidine-timolol, clobetasol cream, colchicine, cyanocobalamin, docusate sodium, dorzolamide, hydrochlorothiazide, insulin glargine, insulin lispro, latanoprost, levocetirizine, meloxicam, olmesartan, potassium chloride sa, pregabalin, glucagon, hydrocodone-acetaminophen, polyethylene glycol, and tramadol, and the  following Facility-Administered Medications: fentanyl, lactated ringers, and lidocaine (pf). His primarily concern today is the Knee Pain (BILATERAL)  Initial Vital Signs: Blood pressure 136/70, pulse 70, temperature 98.3 F (36.8 C), temperature source Oral, resp. rate 16, height 5' 11.5" (1.816 m), weight 184 lb (83.5 kg), SpO2 97 %. BMI: Estimated body mass index is 25.31 kg/m as calculated from the following:   Height as of this encounter: 5' 11.5" (1.816 m).   Weight as of this encounter: 184 lb (83.5 kg).  Risk Assessment: Allergies: Reviewed. He is allergic to lisinopril.  Allergy Precautions: None required Coagulopathies: Reviewed. None identified.  Blood-thinner therapy: None at this time Active Infection(s): Reviewed. None identified. Dylan Quinn is afebrile  Site Confirmation: Dylan Quinn was asked to confirm the procedure and laterality before marking the site Procedure checklist: Completed Consent: Before the procedure and under the influence of no sedative(s), amnesic(s), or anxiolytics, the patient was informed of the treatment options, risks and possible complications. To fulfill our ethical and legal obligations, as recommended by the American Medical Association's Code of Ethics, I have informed the patient of my clinical impression; the nature and purpose of the treatment or procedure; the risks, benefits, and possible complications of the intervention; the alternatives, including doing nothing; the risk(s) and benefit(s) of the alternative treatment(s) or procedure(s); and the risk(s) and benefit(s) of doing nothing. The patient was provided information about the general risks and possible complications associated with the procedure. These may include, but are not limited to: failure to achieve desired goals, infection, bleeding, organ or nerve damage, allergic reactions, paralysis, and death. In addition, the patient was informed of those risks and complications associated to  the procedure, such as failure to decrease pain; infection; bleeding; organ or nerve damage with subsequent damage to sensory, motor, and/or autonomic systems, resulting in permanent pain, numbness, and/or weakness of one or several areas of the body; allergic reactions; (i.e.: anaphylactic reaction); and/or death. Furthermore, the patient was informed  of those risks and complications associated with the medications. These include, but are not limited to: allergic reactions (i.e.: anaphylactic or anaphylactoid reaction(s)); adrenal axis suppression; blood sugar elevation that in diabetics may result in ketoacidosis or comma; water retention that in patients with history of congestive heart failure may result in shortness of breath, pulmonary edema, and decompensation with resultant heart failure; weight gain; swelling or edema; medication-induced neural toxicity; particulate matter embolism and blood vessel occlusion with resultant organ, and/or nervous system infarction; and/or aseptic necrosis of one or more joints. Finally, the patient was informed that Medicine is not an exact science; therefore, there is also the possibility of unforeseen or unpredictable risks and/or possible complications that may result in a catastrophic outcome. The patient indicated having understood very clearly. We have given the patient no guarantees and we have made no promises. Enough time was given to the patient to ask questions, all of which were answered to the patient's satisfaction. Dylan Quinn has indicated that he wanted to continue with the procedure. Attestation: I, the ordering provider, attest that I have discussed with the patient the benefits, risks, side-effects, alternatives, likelihood of achieving goals, and potential problems during recovery for the procedure that I have provided informed consent. Date: 12/18/2017; Time: 10:20 AM  Pre-Procedure Preparation:  Monitoring: As per clinic protocol. Respiration,  ETCO2, SpO2, BP, heart rate and rhythm monitor placed and checked for adequate function Safety Precautions: Patient was assessed for positional comfort and pressure points before starting the procedure. Time-out: I initiated and conducted the "Time-out" before starting the procedure, as per protocol. The patient was asked to participate by confirming the accuracy of the "Time Out" information. Verification of the correct person, site, and procedure were performed and confirmed by me, the nursing staff, and the patient. "Time-out" conducted as per Joint Commission's Universal Protocol (UP.01.01.01). "Time-out" Date & Time: 12/18/2017; 1052 hrs.  Description of Procedure Process:  Position: Supine Target Area: For Genicular Nerve block(s), the targets are: the superior-lateral genicular nerve, located in the lateral distal portion of the femoral shaft as it curves to form the lateral epicondyle, in the region of the distal femoral metaphysis; the superior-medial genicular nerve, located in the medial distal portion of the femoral shaft as it curves to form the medial epicondyle; and the inferior-medial genicular nerve, located in the medial, proximal portion of the tibial shaft, as it curves to form the medial epicondyle, in the region of the proximal tibial metaphysis. Approach: Anterior, ipsilateral approach. Area Prepped: Entire knee area, from mid-thigh to mid-shin, lateral, anterior, and medial aspects. Prepping solution: ChloraPrep (2% chlorhexidine gluconate and 70% isopropyl alcohol) Safety Precautions: Aspiration looking for blood return was conducted prior to all injections. At no point did we inject any substances, as a needle was being advanced. No attempts were made at seeking any paresthesias. Safe injection practices and needle disposal techniques used. Medications properly checked for expiration dates. SDV (single dose vial) medications used. Latex Allergy precautions taken.   Description  of the Procedure: Protocol guidelines were followed. The patient was placed in position over the procedure table. The target area was identified and the area prepped in the usual manner. Skin desensitized using vapocoolant spray. Skin & deeper tissues infiltrated with local anesthetic. Appropriate amount of time allowed to pass for local anesthetics to take effect. The procedure needles were then advanced to the target area. Proper needle placement secured. Negative aspiration confirmed. Solution injected in intermittent fashion, asking for systemic symptoms every 0.5cc of injectate.  The needles were then removed and the area cleansed, making sure to leave some of the prepping solution back to take advantage of its long term bactericidal properties.  Vitals:   12/18/17 1113 12/18/17 1122 12/18/17 1132 12/18/17 1142  BP: (!) 167/84 (!) 170/77 (!) 164/79 (!) 159/86  Pulse:      Resp: 11 10 10 14   Temp:  97.9 F (36.6 C)  97.9 F (36.6 C)  TempSrc:      SpO2: 97% 95% 98% 97%  Weight:      Height:        Start Time: 1053 hrs. End Time: 1111 hrs. Materials:  Needle(s) Type: Regular needle Gauge: 22G Length: 3.5-in Medication(s): We administered fentaNYL, ropivacaine (PF) 2 mg/mL (0.2%), and dexamethasone. Please see chart orders for dosing details. 10 cc solution made of 9 cc of 0.2% ropivacaine, 1 cc of Decadron 10 mg/cc.  1-1.5 cc injected at each level bilaterally. Imaging Guidance (Non-Spinal):  Type of Imaging Technique: Fluoroscopy Guidance (Non-Spinal) Indication(s): Assistance in needle guidance and placement for procedures requiring needle placement in or near specific anatomical locations not easily accessible without such assistance. Exposure Time: Please see nurses notes. Contrast: Before injecting any contrast, we confirmed that the patient did not have an allergy to iodine, shellfish, or radiological contrast. Once satisfactory needle placement was completed at the desired level,  radiological contrast was injected. Contrast injected under live fluoroscopy. No contrast complications. See chart for type and volume of contrast used. Fluoroscopic Guidance: I was personally present during the use of fluoroscopy. "Tunnel Vision Technique" used to obtain the best possible view of the target area. Parallax error corrected before commencing the procedure. "Direction-depth-direction" technique used to introduce the needle under continuous pulsed fluoroscopy. Once target was reached, antero-posterior, oblique, and lateral fluoroscopic projection used confirm needle placement in all planes. Images permanently stored in EMR. Interpretation: I personally interpreted the imaging intraoperatively. Adequate needle placement confirmed in multiple planes. Appropriate spread of contrast into desired area was observed. No evidence of afferent or efferent intravascular uptake. Permanent images saved into the patient's record.  Antibiotic Prophylaxis:  Indication(s): None identified Antibiotic given: None  Post-operative Assessment:  EBL: None Complications: No immediate post-treatment complications observed by team, or reported by patient. Note: The patient tolerated the entire procedure well. A repeat set of vitals were taken after the procedure and the patient was kept under observation following institutional policy, for this type of procedure. Post-procedural neurological assessment was performed, showing return to baseline, prior to discharge. The patient was provided with post-procedure discharge instructions, including a section on how to identify potential problems. Should any problems arise concerning this procedure, the patient was given instructions to immediately contact us, at any time, without hesitation. In any case, we plan to contact the patient by telephone for a follow-up status report regarding this interventional procedure. Comments:  No additional relevant information.  Plan of  Care   Imaging Orders     DG C-Arm 1-60 Min-No Report Procedure Orders    No procedure(s) ordered today    Medications ordered for procedure: Meds ordered this encounter  Medications  . fentaNYL (SUBLIMAZE) injection 25-100 mcg    Make sure Narcan is available in the pyxis when using this medication. In the event of respiratory depression (RR< 8/min): Titrate NARCAN (naloxone) in increments of 0.1 to 0.2 mg IV at 2-3 minute intervals, until desired degree of reversal.  . lidocaine (PF) (XYLOCAINE) 1 % injection 10 mL  . ropivacaine (PF) 2  mg/mL (0.2%) (NAROPIN) injection 10 mL  . dexamethasone (DECADRON) injection 10 mg  . lactated ringers infusion 1,000 mL   Medications administered: We administered fentaNYL, ropivacaine (PF) 2 mg/mL (0.2%), and dexamethasone.  See the medical record for exact dosing, route, and time of administration.  This SmartLink is deprecated. Use AVSMEDLIST instead to display the medication list for a patient. Disposition: Discharge home  Discharge Date & Time: 12/18/2017; 1145 hrs.   Physician-requested Follow-up: Return in about 3 weeks (around 01/09/2018) for Post Procedure Evaluation. Future Appointments  Date Time Provider [place]  01/09/2018  1:30 PM Gillis Santa, MD [facility] None   Primary Care Physician: Tracie Harrier, MD Location: [facility] Outpatient Pain Management Facility Note by: Gillis Santa, MD Date: 12/18/2017; Time: 1:56 PM  Disclaimer:  Medicine is not an exact science. The only guarantee in medicine is that nothing is guaranteed. It is important to note that the decision to proceed with this intervention was based on the information collected from the patient. The Data and conclusions were drawn from the patient's questionnaire, the interview, and the physical examination. Because the information was provided in large part by the patient, it cannot be guaranteed that it has not been purposely or unconsciously manipulated.  Every effort has been made to obtain as much relevant data as possible for this evaluation. It is important to note that the conclusions that lead to this procedure are derived in large part from the available data. Always take into account that the treatment will also be dependent on availability of resources and existing treatment guidelines, considered by other Pain Management Practitioners as being common knowledge and practice, at the time of the intervention. For Medico-Legal purposes, it is also important to point out that variation in procedural techniques and pharmacological choices are the acceptable norm. The indications, contraindications, technique, and results of the above procedure should only be interpreted and judged by a Board-Certified Interventional Pain Specialist with extensive familiarity and expertise in the same exact procedure and technique.

## 2017-12-19 ENCOUNTER — Telehealth: Payer: Self-pay

## 2017-12-19 NOTE — Telephone Encounter (Signed)
t message to call if needed after procedure

## 2017-12-20 DIAGNOSIS — E1165 Type 2 diabetes mellitus with hyperglycemia: Secondary | ICD-10-CM | POA: Diagnosis not present

## 2017-12-20 DIAGNOSIS — Z794 Long term (current) use of insulin: Secondary | ICD-10-CM | POA: Diagnosis not present

## 2017-12-27 DIAGNOSIS — H9190 Unspecified hearing loss, unspecified ear: Secondary | ICD-10-CM | POA: Diagnosis not present

## 2017-12-27 DIAGNOSIS — E1021 Type 1 diabetes mellitus with diabetic nephropathy: Secondary | ICD-10-CM | POA: Diagnosis not present

## 2017-12-27 DIAGNOSIS — I10 Essential (primary) hypertension: Secondary | ICD-10-CM | POA: Diagnosis not present

## 2017-12-27 DIAGNOSIS — E1042 Type 1 diabetes mellitus with diabetic polyneuropathy: Secondary | ICD-10-CM | POA: Diagnosis not present

## 2017-12-27 DIAGNOSIS — M810 Age-related osteoporosis without current pathological fracture: Secondary | ICD-10-CM | POA: Diagnosis not present

## 2017-12-27 DIAGNOSIS — Z79891 Long term (current) use of opiate analgesic: Secondary | ICD-10-CM | POA: Diagnosis not present

## 2017-12-27 DIAGNOSIS — J449 Chronic obstructive pulmonary disease, unspecified: Secondary | ICD-10-CM | POA: Diagnosis not present

## 2017-12-27 DIAGNOSIS — M5116 Intervertebral disc disorders with radiculopathy, lumbar region: Secondary | ICD-10-CM | POA: Diagnosis not present

## 2017-12-27 DIAGNOSIS — H409 Unspecified glaucoma: Secondary | ICD-10-CM | POA: Diagnosis not present

## 2017-12-27 DIAGNOSIS — G894 Chronic pain syndrome: Secondary | ICD-10-CM | POA: Diagnosis not present

## 2017-12-27 DIAGNOSIS — M170 Bilateral primary osteoarthritis of knee: Secondary | ICD-10-CM | POA: Diagnosis not present

## 2017-12-27 DIAGNOSIS — M48061 Spinal stenosis, lumbar region without neurogenic claudication: Secondary | ICD-10-CM | POA: Diagnosis not present

## 2017-12-30 ENCOUNTER — Telehealth: Payer: Self-pay | Admitting: Student in an Organized Health Care Education/Training Program

## 2017-12-30 NOTE — Telephone Encounter (Signed)
[facility] asking if PT 2 times per week for 8 weeks is ok. Verbal OK given.

## 2017-12-30 NOTE — Telephone Encounter (Signed)
[facility] they need to verify verbal orders for 8 weeks. Please call the number listed.

## 2018-01-01 DIAGNOSIS — E1021 Type 1 diabetes mellitus with diabetic nephropathy: Secondary | ICD-10-CM | POA: Diagnosis not present

## 2018-01-01 DIAGNOSIS — M810 Age-related osteoporosis without current pathological fracture: Secondary | ICD-10-CM | POA: Diagnosis not present

## 2018-01-01 DIAGNOSIS — M170 Bilateral primary osteoarthritis of knee: Secondary | ICD-10-CM | POA: Diagnosis not present

## 2018-01-01 DIAGNOSIS — I10 Essential (primary) hypertension: Secondary | ICD-10-CM | POA: Diagnosis not present

## 2018-01-01 DIAGNOSIS — Z79891 Long term (current) use of opiate analgesic: Secondary | ICD-10-CM | POA: Diagnosis not present

## 2018-01-01 DIAGNOSIS — M5116 Intervertebral disc disorders with radiculopathy, lumbar region: Secondary | ICD-10-CM | POA: Diagnosis not present

## 2018-01-01 DIAGNOSIS — M48061 Spinal stenosis, lumbar region without neurogenic claudication: Secondary | ICD-10-CM | POA: Diagnosis not present

## 2018-01-01 DIAGNOSIS — G894 Chronic pain syndrome: Secondary | ICD-10-CM | POA: Diagnosis not present

## 2018-01-01 DIAGNOSIS — J449 Chronic obstructive pulmonary disease, unspecified: Secondary | ICD-10-CM | POA: Diagnosis not present

## 2018-01-01 DIAGNOSIS — H409 Unspecified glaucoma: Secondary | ICD-10-CM | POA: Diagnosis not present

## 2018-01-01 DIAGNOSIS — H9190 Unspecified hearing loss, unspecified ear: Secondary | ICD-10-CM | POA: Diagnosis not present

## 2018-01-01 DIAGNOSIS — E1042 Type 1 diabetes mellitus with diabetic polyneuropathy: Secondary | ICD-10-CM | POA: Diagnosis not present

## 2018-01-02 DIAGNOSIS — H409 Unspecified glaucoma: Secondary | ICD-10-CM | POA: Diagnosis not present

## 2018-01-02 DIAGNOSIS — G894 Chronic pain syndrome: Secondary | ICD-10-CM | POA: Diagnosis not present

## 2018-01-02 DIAGNOSIS — M170 Bilateral primary osteoarthritis of knee: Secondary | ICD-10-CM | POA: Diagnosis not present

## 2018-01-02 DIAGNOSIS — E1042 Type 1 diabetes mellitus with diabetic polyneuropathy: Secondary | ICD-10-CM | POA: Diagnosis not present

## 2018-01-02 DIAGNOSIS — I10 Essential (primary) hypertension: Secondary | ICD-10-CM | POA: Diagnosis not present

## 2018-01-02 DIAGNOSIS — E1021 Type 1 diabetes mellitus with diabetic nephropathy: Secondary | ICD-10-CM | POA: Diagnosis not present

## 2018-01-02 DIAGNOSIS — Z79891 Long term (current) use of opiate analgesic: Secondary | ICD-10-CM | POA: Diagnosis not present

## 2018-01-02 DIAGNOSIS — M5116 Intervertebral disc disorders with radiculopathy, lumbar region: Secondary | ICD-10-CM | POA: Diagnosis not present

## 2018-01-02 DIAGNOSIS — J449 Chronic obstructive pulmonary disease, unspecified: Secondary | ICD-10-CM | POA: Diagnosis not present

## 2018-01-02 DIAGNOSIS — H9190 Unspecified hearing loss, unspecified ear: Secondary | ICD-10-CM | POA: Diagnosis not present

## 2018-01-02 DIAGNOSIS — M48061 Spinal stenosis, lumbar region without neurogenic claudication: Secondary | ICD-10-CM | POA: Diagnosis not present

## 2018-01-02 DIAGNOSIS — M810 Age-related osteoporosis without current pathological fracture: Secondary | ICD-10-CM | POA: Diagnosis not present

## 2018-01-06 DIAGNOSIS — H409 Unspecified glaucoma: Secondary | ICD-10-CM | POA: Diagnosis not present

## 2018-01-06 DIAGNOSIS — G894 Chronic pain syndrome: Secondary | ICD-10-CM | POA: Diagnosis not present

## 2018-01-06 DIAGNOSIS — M170 Bilateral primary osteoarthritis of knee: Secondary | ICD-10-CM | POA: Diagnosis not present

## 2018-01-06 DIAGNOSIS — H9190 Unspecified hearing loss, unspecified ear: Secondary | ICD-10-CM | POA: Diagnosis not present

## 2018-01-06 DIAGNOSIS — J449 Chronic obstructive pulmonary disease, unspecified: Secondary | ICD-10-CM | POA: Diagnosis not present

## 2018-01-06 DIAGNOSIS — Z79891 Long term (current) use of opiate analgesic: Secondary | ICD-10-CM | POA: Diagnosis not present

## 2018-01-06 DIAGNOSIS — I10 Essential (primary) hypertension: Secondary | ICD-10-CM | POA: Diagnosis not present

## 2018-01-06 DIAGNOSIS — M48061 Spinal stenosis, lumbar region without neurogenic claudication: Secondary | ICD-10-CM | POA: Diagnosis not present

## 2018-01-06 DIAGNOSIS — M810 Age-related osteoporosis without current pathological fracture: Secondary | ICD-10-CM | POA: Diagnosis not present

## 2018-01-06 DIAGNOSIS — E1042 Type 1 diabetes mellitus with diabetic polyneuropathy: Secondary | ICD-10-CM | POA: Diagnosis not present

## 2018-01-06 DIAGNOSIS — E1021 Type 1 diabetes mellitus with diabetic nephropathy: Secondary | ICD-10-CM | POA: Diagnosis not present

## 2018-01-06 DIAGNOSIS — M5116 Intervertebral disc disorders with radiculopathy, lumbar region: Secondary | ICD-10-CM | POA: Diagnosis not present

## 2018-01-09 ENCOUNTER — Ambulatory Visit: Payer: Medicare Other | Admitting: Student in an Organized Health Care Education/Training Program

## 2018-01-10 ENCOUNTER — Telehealth: Payer: Self-pay | Admitting: *Deleted

## 2018-01-10 NOTE — Telephone Encounter (Signed)
Spoke with patients wife re; patient experiencing N/V since yesterday morning and she is wondering if it is his medicine.  She states that his dosage was increased from 5 - 325 mg to 7.5-325 mg of the hydrocodone - apap and he began this dosage on Monday.  In my opinion since it was the same medication and a minor dose increase and he has been tolerating since Monday I didn't feel that this was the reason for his N/V.  I asked if patient was tolerating any food and she states that she had been trying to get him to eat an apple but he would not and that he had been taking "those shots" to get his sugar down.  When asked what his sugar was she reported over 500.  Patient told that n/v could be due to this and this needed to be addressed right away whether she called her provider that manages his diabetes or take him to the hospital.  I did emphasize that she needed to do this right away and she verbalizes u/o information.  I did tell her to call me back if she needed anything else.

## 2018-01-15 DIAGNOSIS — R531 Weakness: Secondary | ICD-10-CM | POA: Diagnosis not present

## 2018-01-15 DIAGNOSIS — E1040 Type 1 diabetes mellitus with diabetic neuropathy, unspecified: Secondary | ICD-10-CM | POA: Diagnosis not present

## 2018-01-15 DIAGNOSIS — Z125 Encounter for screening for malignant neoplasm of prostate: Secondary | ICD-10-CM | POA: Diagnosis not present

## 2018-01-15 DIAGNOSIS — E1159 Type 2 diabetes mellitus with other circulatory complications: Secondary | ICD-10-CM | POA: Diagnosis not present

## 2018-01-15 DIAGNOSIS — I10 Essential (primary) hypertension: Secondary | ICD-10-CM | POA: Diagnosis not present

## 2018-01-16 DIAGNOSIS — E1021 Type 1 diabetes mellitus with diabetic nephropathy: Secondary | ICD-10-CM | POA: Diagnosis not present

## 2018-01-16 DIAGNOSIS — I10 Essential (primary) hypertension: Secondary | ICD-10-CM | POA: Diagnosis not present

## 2018-01-16 DIAGNOSIS — G4733 Obstructive sleep apnea (adult) (pediatric): Secondary | ICD-10-CM | POA: Diagnosis not present

## 2018-01-16 DIAGNOSIS — H409 Unspecified glaucoma: Secondary | ICD-10-CM | POA: Diagnosis not present

## 2018-01-16 DIAGNOSIS — E1042 Type 1 diabetes mellitus with diabetic polyneuropathy: Secondary | ICD-10-CM | POA: Diagnosis not present

## 2018-01-16 DIAGNOSIS — J449 Chronic obstructive pulmonary disease, unspecified: Secondary | ICD-10-CM | POA: Diagnosis not present

## 2018-01-16 DIAGNOSIS — G894 Chronic pain syndrome: Secondary | ICD-10-CM | POA: Diagnosis not present

## 2018-01-16 DIAGNOSIS — M170 Bilateral primary osteoarthritis of knee: Secondary | ICD-10-CM | POA: Diagnosis not present

## 2018-01-16 DIAGNOSIS — M48061 Spinal stenosis, lumbar region without neurogenic claudication: Secondary | ICD-10-CM | POA: Diagnosis not present

## 2018-01-16 DIAGNOSIS — H9190 Unspecified hearing loss, unspecified ear: Secondary | ICD-10-CM | POA: Diagnosis not present

## 2018-01-16 DIAGNOSIS — Z79891 Long term (current) use of opiate analgesic: Secondary | ICD-10-CM | POA: Diagnosis not present

## 2018-01-16 DIAGNOSIS — M810 Age-related osteoporosis without current pathological fracture: Secondary | ICD-10-CM | POA: Diagnosis not present

## 2018-01-16 DIAGNOSIS — M5116 Intervertebral disc disorders with radiculopathy, lumbar region: Secondary | ICD-10-CM | POA: Diagnosis not present

## 2018-01-18 DIAGNOSIS — Z794 Long term (current) use of insulin: Secondary | ICD-10-CM | POA: Diagnosis not present

## 2018-01-18 DIAGNOSIS — E1165 Type 2 diabetes mellitus with hyperglycemia: Secondary | ICD-10-CM | POA: Diagnosis not present

## 2018-01-20 ENCOUNTER — Other Ambulatory Visit: Payer: Self-pay | Admitting: Internal Medicine

## 2018-01-21 ENCOUNTER — Other Ambulatory Visit: Payer: Self-pay | Admitting: Internal Medicine

## 2018-01-21 ENCOUNTER — Encounter: Payer: Medicare Other | Admitting: Student in an Organized Health Care Education/Training Program

## 2018-01-21 DIAGNOSIS — I10 Essential (primary) hypertension: Secondary | ICD-10-CM | POA: Diagnosis not present

## 2018-01-21 DIAGNOSIS — M170 Bilateral primary osteoarthritis of knee: Secondary | ICD-10-CM | POA: Diagnosis not present

## 2018-01-21 DIAGNOSIS — Z79891 Long term (current) use of opiate analgesic: Secondary | ICD-10-CM | POA: Diagnosis not present

## 2018-01-21 DIAGNOSIS — H409 Unspecified glaucoma: Secondary | ICD-10-CM | POA: Diagnosis not present

## 2018-01-21 DIAGNOSIS — R634 Abnormal weight loss: Secondary | ICD-10-CM

## 2018-01-21 DIAGNOSIS — J449 Chronic obstructive pulmonary disease, unspecified: Secondary | ICD-10-CM | POA: Diagnosis not present

## 2018-01-21 DIAGNOSIS — M810 Age-related osteoporosis without current pathological fracture: Secondary | ICD-10-CM | POA: Diagnosis not present

## 2018-01-21 DIAGNOSIS — M5116 Intervertebral disc disorders with radiculopathy, lumbar region: Secondary | ICD-10-CM | POA: Diagnosis not present

## 2018-01-21 DIAGNOSIS — H9190 Unspecified hearing loss, unspecified ear: Secondary | ICD-10-CM | POA: Diagnosis not present

## 2018-01-21 DIAGNOSIS — E1042 Type 1 diabetes mellitus with diabetic polyneuropathy: Secondary | ICD-10-CM | POA: Diagnosis not present

## 2018-01-21 DIAGNOSIS — M48061 Spinal stenosis, lumbar region without neurogenic claudication: Secondary | ICD-10-CM | POA: Diagnosis not present

## 2018-01-21 DIAGNOSIS — G894 Chronic pain syndrome: Secondary | ICD-10-CM | POA: Diagnosis not present

## 2018-01-21 DIAGNOSIS — E1021 Type 1 diabetes mellitus with diabetic nephropathy: Secondary | ICD-10-CM | POA: Diagnosis not present

## 2018-01-23 ENCOUNTER — Ambulatory Visit
Payer: Medicare Other | Attending: Student in an Organized Health Care Education/Training Program | Admitting: Student in an Organized Health Care Education/Training Program

## 2018-01-23 ENCOUNTER — Encounter: Payer: Self-pay | Admitting: Student in an Organized Health Care Education/Training Program

## 2018-01-23 VITALS — BP 129/76 | HR 71 | Resp 16 | Ht 72.5 in | Wt 187.0 lb

## 2018-01-23 DIAGNOSIS — Z7982 Long term (current) use of aspirin: Secondary | ICD-10-CM | POA: Diagnosis not present

## 2018-01-23 DIAGNOSIS — G894 Chronic pain syndrome: Secondary | ICD-10-CM | POA: Diagnosis not present

## 2018-01-23 DIAGNOSIS — I10 Essential (primary) hypertension: Secondary | ICD-10-CM | POA: Insufficient documentation

## 2018-01-23 DIAGNOSIS — Z888 Allergy status to other drugs, medicaments and biological substances status: Secondary | ICD-10-CM | POA: Diagnosis not present

## 2018-01-23 DIAGNOSIS — Z794 Long term (current) use of insulin: Secondary | ICD-10-CM | POA: Insufficient documentation

## 2018-01-23 DIAGNOSIS — Z87442 Personal history of urinary calculi: Secondary | ICD-10-CM | POA: Diagnosis not present

## 2018-01-23 DIAGNOSIS — Z809 Family history of malignant neoplasm, unspecified: Secondary | ICD-10-CM | POA: Insufficient documentation

## 2018-01-23 DIAGNOSIS — E1042 Type 1 diabetes mellitus with diabetic polyneuropathy: Secondary | ICD-10-CM | POA: Insufficient documentation

## 2018-01-23 DIAGNOSIS — M47816 Spondylosis without myelopathy or radiculopathy, lumbar region: Secondary | ICD-10-CM

## 2018-01-23 DIAGNOSIS — N400 Enlarged prostate without lower urinary tract symptoms: Secondary | ICD-10-CM | POA: Diagnosis not present

## 2018-01-23 DIAGNOSIS — Z8546 Personal history of malignant neoplasm of prostate: Secondary | ICD-10-CM | POA: Insufficient documentation

## 2018-01-23 DIAGNOSIS — M5416 Radiculopathy, lumbar region: Secondary | ICD-10-CM

## 2018-01-23 DIAGNOSIS — Z8249 Family history of ischemic heart disease and other diseases of the circulatory system: Secondary | ICD-10-CM | POA: Diagnosis not present

## 2018-01-23 DIAGNOSIS — H409 Unspecified glaucoma: Secondary | ICD-10-CM | POA: Insufficient documentation

## 2018-01-23 DIAGNOSIS — M170 Bilateral primary osteoarthritis of knee: Secondary | ICD-10-CM

## 2018-01-23 DIAGNOSIS — E785 Hyperlipidemia, unspecified: Secondary | ICD-10-CM | POA: Diagnosis not present

## 2018-01-23 DIAGNOSIS — Z86010 Personal history of colonic polyps: Secondary | ICD-10-CM | POA: Diagnosis not present

## 2018-01-23 DIAGNOSIS — Z9889 Other specified postprocedural states: Secondary | ICD-10-CM | POA: Diagnosis not present

## 2018-01-23 DIAGNOSIS — Z9849 Cataract extraction status, unspecified eye: Secondary | ICD-10-CM | POA: Diagnosis not present

## 2018-01-23 DIAGNOSIS — Z79899 Other long term (current) drug therapy: Secondary | ICD-10-CM | POA: Insufficient documentation

## 2018-01-23 DIAGNOSIS — M9983 Other biomechanical lesions of lumbar region: Secondary | ICD-10-CM | POA: Diagnosis not present

## 2018-01-23 DIAGNOSIS — M5136 Other intervertebral disc degeneration, lumbar region: Secondary | ICD-10-CM | POA: Diagnosis not present

## 2018-01-23 DIAGNOSIS — M51369 Other intervertebral disc degeneration, lumbar region without mention of lumbar back pain or lower extremity pain: Secondary | ICD-10-CM

## 2018-01-23 DIAGNOSIS — M48061 Spinal stenosis, lumbar region without neurogenic claudication: Secondary | ICD-10-CM

## 2018-01-23 MED ORDER — POLYETHYLENE GLYCOL 3350 17 G PO PACK: 17.0000 g | PACK | Freq: Every day | ORAL | 0 refills | Status: DC

## 2018-01-23 NOTE — Progress Notes (Deleted)
Safety precautions to be maintained throughout the outpatient stay will include: orient to surroundings, keep bed in low position, maintain call bell within reach at all times, provide assistance with transfer out of bed and ambulation.  

## 2018-01-23 NOTE — Progress Notes (Signed)
Nursing Pain Medication Assessment:  Safety precautions to be maintained throughout the outpatient stay will include: orient to surroundings, keep bed in low position, maintain call bell within reach at all times, provide assistance with transfer out of bed and ambulation.  Medication Inspection Compliance: Pill count conducted under aseptic conditions, in front of the patient. Neither the pills nor the bottle was removed from the patient's sight at any time. Once count was completed pills were immediately returned to the patient in their original bottle.  Medication: Hydrocodone/APAP Pill/Patch Count: 68 of 90 pills remain Pill/Patch Appearance: Markings consistent with prescribed medication Bottle Appearance: Standard pharmacy container. Clearly labeled. Filled Date: 01 / 12 / 2019 Last Medication intake:  Today

## 2018-01-23 NOTE — Progress Notes (Signed)
Patient's Name: Dylan Quinn  MRN: [id number]  Referring Provider: Tracie Harrier, MD  DOB: 22-Dec-1936  PCP: Tracie Harrier, MD  DOS: 01/23/2018  Note by: Gillis Santa, MD  Service setting: Ambulatory outpatient  Specialty: Interventional Pain Management  Location: [facility] (AMB) Pain Management Facility    Patient type: Established   Primary Reason(s) for Visit: Encounter for post-procedure evaluation of chronic illness with mild to moderate exacerbation CC: Knee Pain (bilateral) and Hip Pain (left)  HPI  Dylan Quinn is a 82 y.o. year old, male patient, who comes today for a post-procedure evaluation. 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 ([place]); PERIPHERAL NEUROPATHY; Glaucoma; HEARING LOSS; Essential hypertension; Prostate cancer ([place]); Other osteoporosis; PROTEINURIA, MILD; Insomnia; Normocytic anemia; URI (upper respiratory infection); Type 1 diabetes mellitus with nephropathy ([place]); COPD (chronic obstructive pulmonary disease) ([place]); Right knee pain; Primary osteoarthritis of right knee; Diabetic ketoacidosis without coma associated with type 1 diabetes mellitus ([place]); Nausea & vomiting; DKA, type 1 ([place]); Influenza with pneumonia; Staphylococcus aureus bacteremia; Rash and nonspecific skin eruption; Lumbar radiculopathy; Lumbar degenerative disc disease; and Foraminal stenosis of lumbar region on their problem list. His primarily concern today is the Knee Pain (bilateral) and Hip Pain (left)  Pain Assessment: Location: Left, Right(left) Knee(hip) Radiating: na Onset: More than a month ago Duration: Chronic pain Quality: Aching Severity: 5 /10 (self-reported pain score)  Note: Reported level is compatible with observation.                         When using our objective Pain Scale, levels between 6 and 10/10 are said to belong in an emergency room, as it progressively worsens from a 6/10, described as severely limiting, requiring emergency care not usually  available at an outpatient pain management facility. At a 6/10 level, communication becomes difficult and requires great effort. Assistance to reach the emergency department may be required. Facial flushing and profuse sweating along with potentially dangerous increases in heart rate and blood pressure will be evident. Effect on ADL: mobility is better and he also has PT coming into the home and working  Timing: Intermittent Modifying factors: pain medications, procedures  Dylan Quinn comes in today for post-procedure evaluation after the treatment done on 01/09/2018.  Further details on both, my assessment(s), as well as the proposed treatment plan, please see below.  Post-Procedure Assessment  01/09/2018 Procedure: Bilateral Genicular nerves block Pre-procedure pain score:  7/10 Post-procedure pain score: 0/10         Influential Factors: BMI: 25.01 kg/m Intra-procedural challenges: None observed.         Assessment challenges: None detected.              Reported side-effects: None.        Post-procedural adverse reactions or complications: None reported         Sedation: Please see nurses note. When no sedatives are used, the analgesic levels obtained are directly associated to the effectiveness of the local anesthetics. However, when sedation is provided, the level of analgesia obtained during the initial 1 hour following the intervention, is believed to be the result of a combination of factors. These factors may include, but are not limited to: 1. The effectiveness of the local anesthetics used. 2. The effects of the analgesic(s) and/or anxiolytic(s) used. 3. The degree of discomfort experienced by the patient at the time of the procedure. 4. The patients ability and reliability in recalling and recording the events. 5.  The presence and influence of possible secondary gains and/or psychosocial factors. Reported result: Relief experienced during the 1st hour after the procedure: 100 %  (Ultra-Short Term Relief)            Interpretative annotation: Clinically appropriate result. Analgesia during this period is likely to be Local Anesthetic and/or IV Sedative (Analgesic/Anxiolytic) related.          Effects of local anesthetic: The analgesic effects attained during this period are directly associated to the localized infiltration of local anesthetics and therefore cary significant diagnostic value as to the etiological location, or anatomical origin, of the pain. Expected duration of relief is directly dependent on the pharmacodynamics of the local anesthetic used. Long-acting (4-6 hours) anesthetics used.  Reported result: Relief during the next 4 to 6 hour after the procedure: 100 % (Short-Term Relief)            Interpretative annotation: Clinically appropriate result. Analgesia during this period is likely to be Local Anesthetic-related.          Long-term benefit: Defined as the period of time past the expected duration of local anesthetics (1 hour for short-acting and 4-6 hours for long-acting). With the possible exception of prolonged sympathetic blockade from the local anesthetics, benefits during this period are typically attributed to, or associated with, other factors such as analgesic sensory neuropraxia, antiinflammatory effects, or beneficial biochemical changes provided by agents other than the local anesthetics.  Reported result: Extended relief following procedure: 100 %(no pain for about the first week and then gradually the pain began to come back) (Long-Term Relief)            Interpretative annotation: Clinically appropriate result. Good relief. No permanent benefit expected. Inflammation plays a part in the etiology to the pain.          Current benefits: Defined as reported results that persistent at this point in time.   Analgesia: 50-75 % Dylan Quinn reports improvement of extremity symptoms. Function: Somewhat improved ROM: Somewhat improved Interpretative  annotation: Ongoing benefit. No permanent benefit expected. Effective diagnostic intervention.          Interpretation: Results would suggest a successful diagnostic and therapeutic intervention.                  Plan:  Please see "Plan of Care" for details.        Laboratory Chemistry  Inflammation Markers (CRP: Acute Phase) (ESR: Chronic Phase) Lab Results  Component Value Date   ESRSEDRATE 77 (H) 12/15/2016                 Rheumatology Markers No results found for: RF, ANA, LABURIC, URICUR, LYMEIGGIGMAB, [facility]              Renal Function Markers Lab Results  Component Value Date   BUN 16 12/23/2016   CREATININE 1.30 (H) 09/17/2017   GFRAA >60 12/23/2016   GFRNONAA 56 (L) 12/23/2016                 Hepatic Function Markers Lab Results  Component Value Date   AST 15 12/19/2016   ALT 10 (L) 12/19/2016   ALBUMIN 2.4 (L) 12/19/2016   ALKPHOS 72 12/19/2016   AMYLASE 32 12/27/2010   LIPASE 10 (L) 12/18/2016                 Electrolytes Lab Results  Component Value Date   NA 130 (L) 12/23/2016   K 4.9 12/23/2016   CL 98 (  L) 12/23/2016   CALCIUM 7.9 (L) 12/23/2016   MG 1.8 12/18/2016                 Neuropathy Markers Lab Results  Component Value Date   VITAMINB12 551 12/29/2015   FOLATE 8.2 12/29/2015   HGBA1C 10.4 10/09/2016   HIV Non Reactive 12/15/2016                 Bone Pathology Markers Lab Results  Component Value Date   TESTOSTERONE 300.16 (L) 11/24/2010                 Coagulation Parameters Lab Results  Component Value Date   PLT 462 (H) 12/23/2016                 Cardiovascular Markers Lab Results  Component Value Date   BNP 201.8 (H) 12/15/2016   TROPONINI 0.04 (HH) 12/11/2016   HGB 9.9 (L) 12/23/2016   HCT 29.1 (L) 12/23/2016                 CA Markers No results found for: CEA, CA125, LABCA2               Note: Lab results reviewed.  Recent Diagnostic Imaging Results  DG C-Arm 1-60 Min-No Report Fluoroscopy was  utilized by the requesting physician.  No radiographic  interpretation.   Complexity Note: Imaging results reviewed. Results shared with Dylan Quinn, using Layman's terms.                         Meds   Current Outpatient Medications:  .  aspirin (GOODSENSE ASPIRIN) 81 MG chewable tablet, Chew 81 mg by mouth daily. , Disp: , Rfl:  .  brimonidine-timolol (COMBIGAN) 0.2-0.5 % ophthalmic solution, Place 1 drop into both eyes 2 (two) times daily. , Disp: , Rfl:  .  clobetasol cream (TEMOVATE) 6.96 %, Apply 1 application topically 2 (two) times daily as needed (for rash). , Disp: , Rfl:  .  colchicine 0.6 MG tablet, Take 2 tablets by mouth. 2 tablets at first sign of gout flare followed by 1, Disp: , Rfl:  .  Docusate Sodium (COLACE PO), Take 2 capsules by mouth as needed., Disp: , Rfl:  .  dorzolamide (TRUSOPT) 2 % ophthalmic solution, Place 1 drop into both eyes 2 (two) times daily., Disp: , Rfl:  .  HYDROcodone-acetaminophen (NORCO) 7.5-325 MG tablet, Take 1 tablet by mouth 3 (three) times daily as needed for moderate pain., Disp: 90 tablet, Rfl: 0 .  Insulin Glargine (LANTUS SOLOSTAR) 100 UNIT/ML Solostar Pen, Inject 11 Units into the skin daily. , Disp: , Rfl:  .  insulin lispro (HUMALOG) 100 UNIT/ML injection, Inject 0-10 Units into the skin 3 (three) times daily before meals. + sliding scale if BG>150, Disp: , Rfl:  .  latanoprost (XALATAN) 0.005 % ophthalmic solution, Place 1 drop into both eyes at bedtime., Disp: , Rfl:  .  olmesartan (BENICAR) 40 MG tablet, Take 1 tablet (40 mg total) by mouth daily., Disp: 90 tablet, Rfl: 3 .  polyethylene glycol (MIRALAX / GLYCOLAX) packet, Take 17 g by mouth daily., Disp: 14 each, Rfl: 0 .  potassium chloride SA (K-DUR,KLOR-CON) 20 MEQ tablet, Take 2 tablets (40 mEq total) by mouth daily., Disp: 30 tablet, Rfl: 2 .  pregabalin (LYRICA) 100 MG capsule, Take 1 capsule (100 mg total) by mouth 2 (two) times daily., Disp: 180 capsule, Rfl: 1 .   Cyanocobalamin (  B-12 IJ), Inject 1 mcg as directed every 30 (thirty) days., Disp: , Rfl:  .  glucagon (GLUCAGON EMERGENCY) 1 MG injection, Inject 1 mg into the muscle once as needed. (Patient not taking: Reported on 10/17/2017), Disp: 2 each, Rfl: 1 .  hydrochlorothiazide (MICROZIDE) 12.5 MG capsule, Take 1 capsule by mouth daily., Disp: , Rfl:  .  levocetirizine (XYZAL) 5 MG tablet, Take 1 tablet by mouth daily. In evening, Disp: , Rfl:  .  meloxicam (MOBIC) 7.5 MG tablet, Take 1 tablet (7.5 mg total) by mouth daily. (Patient not taking: Reported on 01/23/2018), Disp: 30 tablet, Rfl: 3 .  traMADol (ULTRAM) 50 MG tablet, Take 1 tablet (50 mg total) by mouth every 6 (six) hours as needed for moderate pain or severe pain. (Patient not taking: Reported on 10/17/2017), Disp: 30 tablet, Rfl: 0  ROS  Constitutional: Denies any fever or chills Gastrointestinal: No reported hemesis, hematochezia, vomiting, or acute GI distress Musculoskeletal: Denies any acute onset joint swelling, redness, loss of ROM, or weakness Neurological: No reported episodes of acute onset apraxia, aphasia, dysarthria, agnosia, amnesia, paralysis, loss of coordination, or loss of consciousness  Allergies  Dylan Quinn is allergic to lisinopril.  Dylan Quinn  Drug: Dylan Quinn  reports that he does not use drugs. Alcohol:  reports that he does not drink alcohol. Tobacco:  reports that  has never smoked. he has never used smokeless tobacco. Medical:  has a past medical history of BENIGN PROSTATIC HYPERTROPHY (07/19/2009), COLONIC POLYPS (02/14/2010), DEPRESSION (03/16/2008), DIABETES MELLITUS, TYPE I (06/30/2007), Dyslipidemia, FOLLICULITIS (01/30/2535), GLAUCOMA (07/19/2009), HEARING LOSS (02/14/2010), HYPERTENSION (05/14/2008), HYPOGONADISM, MALE (12/16/2007), Leukopenia, LUMBAR RADICULOPATHY, LEFT (02/23/2010), Other osteoporosis (12/16/2007), PERIPHERAL NEUROPATHY (06/30/2007), Prostate cancer ([place]), PROTEINURIA, MILD (02/14/2010), Psoriasis, and  URINARY CALCULUS (12/27/2010). Surgical: Mr. Weier  has a past surgical history that includes Cataract extraction (2006); Cataract extraction (1997); Appendectomy (1986); and TEE without cardioversion (N/A, 12/19/2016). Family: family history includes Cancer in his father; Heart disease in his father; High blood pressure in his unknown relative.  Constitutional Exam  General appearance: Well nourished, well developed, and well hydrated. In no apparent acute distress Vitals:   01/23/18 1223  BP: 129/76  Pulse: 71  Resp: 16  SpO2: 100%  Weight: 187 lb (84.8 kg)  Height: 6' 0.5" (1.842 m)   BMI Assessment: Estimated body mass index is 25.01 kg/m as calculated from the following:   Height as of this encounter: 6' 0.5" (1.842 m).   Weight as of this encounter: 187 lb (84.8 kg).  BMI interpretation table: BMI level Category Range association with higher incidence of chronic pain  <18 kg/m2 Underweight   18.5-24.9 kg/m2 Ideal body weight   25-29.9 kg/m2 Overweight Increased incidence by 20%  30-34.9 kg/m2 Obese (Class I) Increased incidence by 68%  35-39.9 kg/m2 Severe obesity (Class II) Increased incidence by 136%  >40 kg/m2 Extreme obesity (Class III) Increased incidence by 254%   BMI Readings from Last 4 Encounters:  01/23/18 25.01 kg/m  12/18/17 25.31 kg/m  12/11/17 24.08 kg/m  11/25/17 26.08 kg/m   Wt Readings from Last 4 Encounters:  01/23/18 187 lb (84.8 kg)  12/18/17 184 lb (83.5 kg)  12/11/17 185 lb (83.9 kg)  11/25/17 187 lb (84.8 kg)  Psych/Mental status: Alert, oriented x 3 (person, place, & time)       Eyes: PERLA Respiratory: No evidence of acute respiratory distress  Cervical Spine Area Exam  Skin & Axial Inspection: No masses, redness, edema, swelling, or associated skin lesions Alignment: Symmetrical  Functional ROM: Unrestricted ROM      Stability: No instability detected Muscle Tone/Strength: Functionally intact. No obvious neuro-muscular anomalies  detected. Sensory (Neurological): Unimpaired Palpation: No palpable anomalies              Upper Extremity (UE) Exam    Side: Right upper extremity  Side: Left upper extremity  Skin & Extremity Inspection: Skin color, temperature, and hair growth are WNL. No peripheral edema or cyanosis. No masses, redness, swelling, asymmetry, or associated skin lesions. No contractures.  Skin & Extremity Inspection: Skin color, temperature, and hair growth are WNL. No peripheral edema or cyanosis. No masses, redness, swelling, asymmetry, or associated skin lesions. No contractures.  Functional ROM: Unrestricted ROM          Functional ROM: Unrestricted ROM          Muscle Tone/Strength: Functionally intact. No obvious neuro-muscular anomalies detected.  Muscle Tone/Strength: Functionally intact. No obvious neuro-muscular anomalies detected.  Sensory (Neurological): Unimpaired          Sensory (Neurological): Unimpaired          Palpation: No palpable anomalies              Palpation: No palpable anomalies              Specialized Test(s): Deferred         Specialized Test(s): Deferred          Thoracic Spine Area Exam  Skin & Axial Inspection: No masses, redness, or swelling Alignment: Symmetrical Functional ROM: Unrestricted ROM Stability: No instability detected Muscle Tone/Strength: Functionally intact. No obvious neuro-muscular anomalies detected. Sensory (Neurological): Unimpaired Muscle strength & Tone: No palpable anomalies  Lumbar Spine Area Exam  Skin & Axial Inspection:Paravertebral muscle atrophy Alignment:Asymmetric Functional SKA:[id number] ROM Stability:No instability detected Muscle Tone/Strength:Functionally intact. No obvious neuro-muscular anomalies detected. Sensory (Neurological):Movement-associated pain Palpation:Complains of area being tender to palpationBilateral Fist Percussion Test Provocative Tests: Lumbar Hyperextension and rotation test:Positivebilaterally  for facet joint pain. Lumbar Lateral bending test:Positivedue to pain. Patrick's Maneuver:evaluation deferred today  Gait & Posture Assessment  Ambulation:Limited Gait:Antalgic Posture:Difficulty standing up straight, due to pain   Lower Extremity Exam    Side: Right lower extremity  Side: Left lower extremity  Skin & Extremity Inspection: Skin color, temperature, and hair growth are WNL. No peripheral edema or cyanosis. No masses, redness, swelling, asymmetry, or associated skin lesions. No contractures.  Skin & Extremity Inspection: Skin color, temperature, and hair growth are WNL. No peripheral edema or cyanosis. No masses, redness, swelling, asymmetry, or associated skin lesions. No contractures.  Functional ROM: Unrestricted ROM          Functional ROM: Unrestricted ROM          Muscle Tone/Strength: Functionally intact. No obvious neuro-muscular anomalies detected.  Muscle Tone/Strength: Functionally intact. No obvious neuro-muscular anomalies detected.  Sensory (Neurological): Unimpaired  Sensory (Neurological): Unimpaired  Palpation: No palpable anomalies  Palpation: No palpable anomalies   Assessment  Primary Diagnosis & Pertinent Problem List: The primary encounter diagnosis was Bilateral primary osteoarthritis of knee. Diagnoses of Chronic pain syndrome, Lumbar radiculopathy, Foraminal stenosis of lumbar region, Lumbar degenerative disc disease, and Lumbar spondylosis were also pertinent to this visit.  Status Diagnosis  Controlled Controlled Controlled 1. Bilateral primary osteoarthritis of knee   2. Chronic pain syndrome   3. Lumbar radiculopathy   4. Foraminal stenosis of lumbar region   5. Lumbar degenerative disc disease   6. Lumbar spondylosis     General Recommendations: The  pain condition that the patient suffers from is best treated with a multidisciplinary approach that involves an increase in physical activity to prevent de-conditioning  and worsening of the pain cycle, as well as psychological counseling (formal and/or informal) to address the co-morbid psychological affects of pain. Treatment will often involve judicious use of pain medications and interventional procedures to decrease the pain, allowing the patient to participate in the physical activity that will ultimately produce long-lasting pain reductions. The goal of the multidisciplinary approach is to return the patient to a higher level of overall function and to restore their ability to perform activities of daily living.   82 year old male with a history of type 1 diabetes who presents with axial low back pain with radiation to bilateral lower extremities thathasworsened over the last 2-4 months. Low back pain with radiation to his legs could be secondary to multilevel lumbar spondylosis, facet arthropathy, subarticular stenosis most pronounced at L4-L5 with mild disc bulges and mild lateral recess stenosis at L2-L3 and L3-L4.  Patient also has chronic bilateral knee pain secondary to knee osteoarthritis.  He is status post bilateral genicular nerve block performed on 12/18/2017 and presents for postprocedural follow-up.  Patient notes significant improvement in his knee pain and his ability to ambulate with his walker for an extended period of time without having as much pain.  Patient is also finding benefit from his hydrocodone 7.5 mg which he takes twice daily, once in the morning and once in the evening.  Overall the patient is pleased with his progress and states that he is doing better in terms of his overall pain since the last visit after doing a genicular nerve block.  Patient will follow-up in 3 weeks for medication management.  I will prescribe the patient MiraLAX as below for constipation.   Plan of Care  Pharmacotherapy (Medications Ordered): Meds ordered this encounter  Medications  . polyethylene glycol (MIRALAX / GLYCOLAX) packet    Sig: Take 17 g by  mouth daily.    Dispense:  14 each    Refill:  0    Provider-requested follow-up: Return in about 3 weeks (around 02/13/2018) for Medication Management. Time Note: Greater than 50% of the 15 minute(s) of face-to-face time spent with Dylan Quinn, was spent in counseling/coordination of care regarding: Dylan Quinn primary cause of pain, the treatment plan, treatment alternatives, the results, interpretation and significance of  his recent diagnostic interventional treatment(s), the appropriate use of his medications and the medication agreement. Future Appointments  Date Time Provider [place]  02/06/2018  2:30 PM Gillis Santa, MD [facility] None    Primary Care Physician: Tracie Harrier, MD Location: [facility] Outpatient Pain Management Facility Note by: Gillis Santa, M.D Date: 01/23/2018; Time: 1:32 PM  There are no Patient Instructions on file for this visit.

## 2018-01-24 DIAGNOSIS — E1042 Type 1 diabetes mellitus with diabetic polyneuropathy: Secondary | ICD-10-CM | POA: Diagnosis not present

## 2018-01-24 DIAGNOSIS — H409 Unspecified glaucoma: Secondary | ICD-10-CM | POA: Diagnosis not present

## 2018-01-24 DIAGNOSIS — J449 Chronic obstructive pulmonary disease, unspecified: Secondary | ICD-10-CM | POA: Diagnosis not present

## 2018-01-24 DIAGNOSIS — M5116 Intervertebral disc disorders with radiculopathy, lumbar region: Secondary | ICD-10-CM | POA: Diagnosis not present

## 2018-01-24 DIAGNOSIS — G894 Chronic pain syndrome: Secondary | ICD-10-CM | POA: Diagnosis not present

## 2018-01-24 DIAGNOSIS — I10 Essential (primary) hypertension: Secondary | ICD-10-CM | POA: Diagnosis not present

## 2018-01-24 DIAGNOSIS — E1021 Type 1 diabetes mellitus with diabetic nephropathy: Secondary | ICD-10-CM | POA: Diagnosis not present

## 2018-01-24 DIAGNOSIS — Z79891 Long term (current) use of opiate analgesic: Secondary | ICD-10-CM | POA: Diagnosis not present

## 2018-01-24 DIAGNOSIS — M170 Bilateral primary osteoarthritis of knee: Secondary | ICD-10-CM | POA: Diagnosis not present

## 2018-01-24 DIAGNOSIS — M48061 Spinal stenosis, lumbar region without neurogenic claudication: Secondary | ICD-10-CM | POA: Diagnosis not present

## 2018-01-24 DIAGNOSIS — H9190 Unspecified hearing loss, unspecified ear: Secondary | ICD-10-CM | POA: Diagnosis not present

## 2018-01-24 DIAGNOSIS — M810 Age-related osteoporosis without current pathological fracture: Secondary | ICD-10-CM | POA: Diagnosis not present

## 2018-01-28 DIAGNOSIS — E1042 Type 1 diabetes mellitus with diabetic polyneuropathy: Secondary | ICD-10-CM | POA: Diagnosis not present

## 2018-01-28 DIAGNOSIS — G894 Chronic pain syndrome: Secondary | ICD-10-CM | POA: Diagnosis not present

## 2018-01-28 DIAGNOSIS — M810 Age-related osteoporosis without current pathological fracture: Secondary | ICD-10-CM | POA: Diagnosis not present

## 2018-01-28 DIAGNOSIS — M48061 Spinal stenosis, lumbar region without neurogenic claudication: Secondary | ICD-10-CM | POA: Diagnosis not present

## 2018-01-28 DIAGNOSIS — I10 Essential (primary) hypertension: Secondary | ICD-10-CM | POA: Diagnosis not present

## 2018-01-28 DIAGNOSIS — H9190 Unspecified hearing loss, unspecified ear: Secondary | ICD-10-CM | POA: Diagnosis not present

## 2018-01-28 DIAGNOSIS — H409 Unspecified glaucoma: Secondary | ICD-10-CM | POA: Diagnosis not present

## 2018-01-28 DIAGNOSIS — M170 Bilateral primary osteoarthritis of knee: Secondary | ICD-10-CM | POA: Diagnosis not present

## 2018-01-28 DIAGNOSIS — Z79891 Long term (current) use of opiate analgesic: Secondary | ICD-10-CM | POA: Diagnosis not present

## 2018-01-28 DIAGNOSIS — M5116 Intervertebral disc disorders with radiculopathy, lumbar region: Secondary | ICD-10-CM | POA: Diagnosis not present

## 2018-01-28 DIAGNOSIS — J449 Chronic obstructive pulmonary disease, unspecified: Secondary | ICD-10-CM | POA: Diagnosis not present

## 2018-01-28 DIAGNOSIS — E1021 Type 1 diabetes mellitus with diabetic nephropathy: Secondary | ICD-10-CM | POA: Diagnosis not present

## 2018-01-30 DIAGNOSIS — E1042 Type 1 diabetes mellitus with diabetic polyneuropathy: Secondary | ICD-10-CM | POA: Diagnosis not present

## 2018-01-30 DIAGNOSIS — M48061 Spinal stenosis, lumbar region without neurogenic claudication: Secondary | ICD-10-CM | POA: Diagnosis not present

## 2018-01-30 DIAGNOSIS — I10 Essential (primary) hypertension: Secondary | ICD-10-CM | POA: Diagnosis not present

## 2018-01-30 DIAGNOSIS — H9190 Unspecified hearing loss, unspecified ear: Secondary | ICD-10-CM | POA: Diagnosis not present

## 2018-01-30 DIAGNOSIS — E1021 Type 1 diabetes mellitus with diabetic nephropathy: Secondary | ICD-10-CM | POA: Diagnosis not present

## 2018-01-30 DIAGNOSIS — J449 Chronic obstructive pulmonary disease, unspecified: Secondary | ICD-10-CM | POA: Diagnosis not present

## 2018-01-30 DIAGNOSIS — G894 Chronic pain syndrome: Secondary | ICD-10-CM | POA: Diagnosis not present

## 2018-01-30 DIAGNOSIS — M5116 Intervertebral disc disorders with radiculopathy, lumbar region: Secondary | ICD-10-CM | POA: Diagnosis not present

## 2018-01-30 DIAGNOSIS — M170 Bilateral primary osteoarthritis of knee: Secondary | ICD-10-CM | POA: Diagnosis not present

## 2018-01-30 DIAGNOSIS — Z79891 Long term (current) use of opiate analgesic: Secondary | ICD-10-CM | POA: Diagnosis not present

## 2018-01-30 DIAGNOSIS — H409 Unspecified glaucoma: Secondary | ICD-10-CM | POA: Diagnosis not present

## 2018-01-30 DIAGNOSIS — M810 Age-related osteoporosis without current pathological fracture: Secondary | ICD-10-CM | POA: Diagnosis not present

## 2018-02-03 ENCOUNTER — Ambulatory Visit
Admission: RE | Admit: 2018-02-03 | Discharge: 2018-02-03 | Disposition: A | Discharge status: Home / Self Care | Payer: Medicare Other | Source: Ambulatory Visit | Attending: Internal Medicine | Admitting: Internal Medicine

## 2018-02-03 DIAGNOSIS — I700 Atherosclerosis of aorta: Secondary | ICD-10-CM | POA: Insufficient documentation

## 2018-02-03 DIAGNOSIS — E1122 Type 2 diabetes mellitus with diabetic chronic kidney disease: Secondary | ICD-10-CM | POA: Diagnosis not present

## 2018-02-03 DIAGNOSIS — J986 Disorders of diaphragm: Secondary | ICD-10-CM | POA: Insufficient documentation

## 2018-02-03 DIAGNOSIS — N183 Chronic kidney disease, stage 3 (moderate): Secondary | ICD-10-CM | POA: Diagnosis not present

## 2018-02-03 DIAGNOSIS — N200 Calculus of kidney: Secondary | ICD-10-CM | POA: Diagnosis not present

## 2018-02-03 DIAGNOSIS — M47816 Spondylosis without myelopathy or radiculopathy, lumbar region: Secondary | ICD-10-CM | POA: Insufficient documentation

## 2018-02-03 DIAGNOSIS — R634 Abnormal weight loss: Secondary | ICD-10-CM | POA: Insufficient documentation

## 2018-02-03 DIAGNOSIS — R809 Proteinuria, unspecified: Secondary | ICD-10-CM | POA: Diagnosis not present

## 2018-02-03 DIAGNOSIS — I129 Hypertensive chronic kidney disease with stage 1 through stage 4 chronic kidney disease, or unspecified chronic kidney disease: Secondary | ICD-10-CM | POA: Diagnosis not present

## 2018-02-04 DIAGNOSIS — E1021 Type 1 diabetes mellitus with diabetic nephropathy: Secondary | ICD-10-CM | POA: Diagnosis not present

## 2018-02-04 DIAGNOSIS — I10 Essential (primary) hypertension: Secondary | ICD-10-CM | POA: Diagnosis not present

## 2018-02-04 DIAGNOSIS — M810 Age-related osteoporosis without current pathological fracture: Secondary | ICD-10-CM | POA: Diagnosis not present

## 2018-02-04 DIAGNOSIS — E1042 Type 1 diabetes mellitus with diabetic polyneuropathy: Secondary | ICD-10-CM | POA: Diagnosis not present

## 2018-02-04 DIAGNOSIS — J449 Chronic obstructive pulmonary disease, unspecified: Secondary | ICD-10-CM | POA: Diagnosis not present

## 2018-02-04 DIAGNOSIS — M48061 Spinal stenosis, lumbar region without neurogenic claudication: Secondary | ICD-10-CM | POA: Diagnosis not present

## 2018-02-04 DIAGNOSIS — M5116 Intervertebral disc disorders with radiculopathy, lumbar region: Secondary | ICD-10-CM | POA: Diagnosis not present

## 2018-02-04 DIAGNOSIS — H409 Unspecified glaucoma: Secondary | ICD-10-CM | POA: Diagnosis not present

## 2018-02-04 DIAGNOSIS — M170 Bilateral primary osteoarthritis of knee: Secondary | ICD-10-CM | POA: Diagnosis not present

## 2018-02-04 DIAGNOSIS — G894 Chronic pain syndrome: Secondary | ICD-10-CM | POA: Diagnosis not present

## 2018-02-04 DIAGNOSIS — Z79891 Long term (current) use of opiate analgesic: Secondary | ICD-10-CM | POA: Diagnosis not present

## 2018-02-04 DIAGNOSIS — H9190 Unspecified hearing loss, unspecified ear: Secondary | ICD-10-CM | POA: Diagnosis not present

## 2018-02-06 ENCOUNTER — Encounter: Payer: Self-pay | Admitting: Student in an Organized Health Care Education/Training Program

## 2018-02-06 ENCOUNTER — Ambulatory Visit
Payer: Medicare Other | Attending: Student in an Organized Health Care Education/Training Program | Admitting: Student in an Organized Health Care Education/Training Program

## 2018-02-06 ENCOUNTER — Other Ambulatory Visit: Payer: Self-pay

## 2018-02-06 VITALS — BP 123/70 | HR 83 | Temp 97.7°F | Resp 16 | Ht 73.0 in | Wt 187.0 lb

## 2018-02-06 DIAGNOSIS — H409 Unspecified glaucoma: Secondary | ICD-10-CM | POA: Insufficient documentation

## 2018-02-06 DIAGNOSIS — M5416 Radiculopathy, lumbar region: Secondary | ICD-10-CM

## 2018-02-06 DIAGNOSIS — Z86010 Personal history of colonic polyps: Secondary | ICD-10-CM | POA: Diagnosis not present

## 2018-02-06 DIAGNOSIS — I700 Atherosclerosis of aorta: Secondary | ICD-10-CM | POA: Diagnosis not present

## 2018-02-06 DIAGNOSIS — M51369 Other intervertebral disc degeneration, lumbar region without mention of lumbar back pain or lower extremity pain: Secondary | ICD-10-CM

## 2018-02-06 DIAGNOSIS — M9983 Other biomechanical lesions of lumbar region: Secondary | ICD-10-CM | POA: Diagnosis not present

## 2018-02-06 DIAGNOSIS — G4700 Insomnia, unspecified: Secondary | ICD-10-CM | POA: Diagnosis not present

## 2018-02-06 DIAGNOSIS — F329 Major depressive disorder, single episode, unspecified: Secondary | ICD-10-CM | POA: Diagnosis not present

## 2018-02-06 DIAGNOSIS — M170 Bilateral primary osteoarthritis of knee: Secondary | ICD-10-CM | POA: Diagnosis not present

## 2018-02-06 DIAGNOSIS — D649 Anemia, unspecified: Secondary | ICD-10-CM | POA: Diagnosis not present

## 2018-02-06 DIAGNOSIS — I10 Essential (primary) hypertension: Secondary | ICD-10-CM | POA: Diagnosis not present

## 2018-02-06 DIAGNOSIS — M47816 Spondylosis without myelopathy or radiculopathy, lumbar region: Secondary | ICD-10-CM | POA: Diagnosis not present

## 2018-02-06 DIAGNOSIS — E1042 Type 1 diabetes mellitus with diabetic polyneuropathy: Secondary | ICD-10-CM | POA: Diagnosis not present

## 2018-02-06 DIAGNOSIS — M5116 Intervertebral disc disorders with radiculopathy, lumbar region: Secondary | ICD-10-CM | POA: Diagnosis not present

## 2018-02-06 DIAGNOSIS — J449 Chronic obstructive pulmonary disease, unspecified: Secondary | ICD-10-CM | POA: Insufficient documentation

## 2018-02-06 DIAGNOSIS — E1010 Type 1 diabetes mellitus with ketoacidosis without coma: Secondary | ICD-10-CM | POA: Insufficient documentation

## 2018-02-06 DIAGNOSIS — Z79891 Long term (current) use of opiate analgesic: Secondary | ICD-10-CM | POA: Insufficient documentation

## 2018-02-06 DIAGNOSIS — N400 Enlarged prostate without lower urinary tract symptoms: Secondary | ICD-10-CM | POA: Diagnosis not present

## 2018-02-06 DIAGNOSIS — M5136 Other intervertebral disc degeneration, lumbar region: Secondary | ICD-10-CM | POA: Diagnosis not present

## 2018-02-06 DIAGNOSIS — C61 Malignant neoplasm of prostate: Secondary | ICD-10-CM | POA: Insufficient documentation

## 2018-02-06 DIAGNOSIS — Z79899 Other long term (current) drug therapy: Secondary | ICD-10-CM | POA: Insufficient documentation

## 2018-02-06 DIAGNOSIS — Z888 Allergy status to other drugs, medicaments and biological substances status: Secondary | ICD-10-CM | POA: Insufficient documentation

## 2018-02-06 DIAGNOSIS — G894 Chronic pain syndrome: Secondary | ICD-10-CM | POA: Diagnosis not present

## 2018-02-06 DIAGNOSIS — Z8249 Family history of ischemic heart disease and other diseases of the circulatory system: Secondary | ICD-10-CM | POA: Insufficient documentation

## 2018-02-06 DIAGNOSIS — M48061 Spinal stenosis, lumbar region without neurogenic claudication: Secondary | ICD-10-CM

## 2018-02-06 DIAGNOSIS — Z809 Family history of malignant neoplasm, unspecified: Secondary | ICD-10-CM | POA: Insufficient documentation

## 2018-02-06 DIAGNOSIS — Z791 Long term (current) use of non-steroidal anti-inflammatories (NSAID): Secondary | ICD-10-CM | POA: Diagnosis not present

## 2018-02-06 DIAGNOSIS — Z7982 Long term (current) use of aspirin: Secondary | ICD-10-CM | POA: Diagnosis not present

## 2018-02-06 DIAGNOSIS — E785 Hyperlipidemia, unspecified: Secondary | ICD-10-CM | POA: Insufficient documentation

## 2018-02-06 DIAGNOSIS — Z9889 Other specified postprocedural states: Secondary | ICD-10-CM | POA: Insufficient documentation

## 2018-02-06 DIAGNOSIS — Z87442 Personal history of urinary calculi: Secondary | ICD-10-CM | POA: Diagnosis not present

## 2018-02-06 DIAGNOSIS — E1021 Type 1 diabetes mellitus with diabetic nephropathy: Secondary | ICD-10-CM | POA: Diagnosis not present

## 2018-02-06 DIAGNOSIS — Z794 Long term (current) use of insulin: Secondary | ICD-10-CM | POA: Insufficient documentation

## 2018-02-06 MED ORDER — HYDROCODONE-ACETAMINOPHEN 7.5-325 MG PO TABS: 1.0000 | ORAL_TABLET | Freq: Three times a day (TID) | ORAL | 0 refills | Status: DC | PRN

## 2018-02-06 NOTE — Progress Notes (Signed)
Nursing Pain Medication Assessment:  Safety precautions to be maintained throughout the outpatient stay will include: orient to surroundings, keep bed in low position, maintain call bell within reach at all times, provide assistance with transfer out of bed and ambulation.  Medication Inspection Compliance: Pill count conducted under aseptic conditions, in front of the patient. Neither the pills nor the bottle was removed from the patient's sight at any time. Once count was completed pills were immediately returned to the patient in their original bottle.  Medication: Hydrocodone/APAP Pill/Patch Count: 54 of 90 pills remain Pill/Patch Appearance: Markings consistent with prescribed medication Bottle Appearance: Standard pharmacy container. Clearly labeled. Filled Date:01/12/ 2019 Last Medication intake:  Today

## 2018-02-06 NOTE — Patient Instructions (Signed)
You were given 3 prescriptions for Hydrocodone today. 

## 2018-02-06 NOTE — Progress Notes (Signed)
Patient's Name: Dylan Quinn  MRN: [id number]  Referring Provider: Tracie Harrier, MD  DOB: 1936/06/06  PCP: Tracie Harrier, MD  DOS: 02/06/2018  Note by: Gillis Santa, MD  Service setting: Ambulatory outpatient  Specialty: Interventional Pain Management  Location: [facility] (AMB) Pain Management Facility    Patient type: Established   Primary Reason(s) for Visit: Encounter for prescription drug management. (Level of risk: moderate)  CC: Knee Pain (both, worse in right)  HPI  Dylan Quinn is a 82 y.o. year old, male patient, who comes today for a medication management evaluation. 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 ([place]); PERIPHERAL NEUROPATHY; Glaucoma; HEARING LOSS; Essential hypertension; Prostate cancer ([place]); Other osteoporosis; PROTEINURIA, MILD; Insomnia; Normocytic anemia; URI (upper respiratory infection); Type 1 diabetes mellitus with nephropathy ([place]); COPD (chronic obstructive pulmonary disease) ([place]); Right knee pain; Primary osteoarthritis of right knee; Diabetic ketoacidosis without coma associated with type 1 diabetes mellitus ([place]); Nausea & vomiting; DKA, type 1 ([place]); Influenza with pneumonia; Staphylococcus aureus bacteremia; Rash and nonspecific skin eruption; Lumbar radiculopathy; Lumbar degenerative disc disease; and Foraminal stenosis of lumbar region on their problem list. His primarily concern today is the Knee Pain (both, worse in right)  Pain Assessment: Location: Right, Left Knee Radiating: denies Onset: More than a month ago Duration: Chronic pain Quality: (piercing) Severity: 5 /10 (self-reported pain score)  Note: Reported level is inconsistent with clinical observations. Clinically the patient looks like a 2/10             When using our objective Pain Scale, levels between 6 and 10/10 are said to belong in an emergency room, as it progressively worsens from a 6/10, described as severely limiting, requiring emergency care not usually  available at an outpatient pain management facility. At a 6/10 level, communication becomes difficult and requires great effort. Assistance to reach the emergency department may be required. Facial flushing and profuse sweating along with potentially dangerous increases in heart rate and blood pressure will be evident. Effect on ADL:   Timing: Intermittent Modifying factors: rest, medications  Dylan Quinn was last scheduled for an appointment on 01/23/2018 for medication management. During today's appointment we reviewed Dylan Quinn chronic pain status, as well as his outpatient medication regimen.  The patient  reports that he does not use drugs. His body mass index is 24.67 kg/m.  Further details on both, my assessment(s), as well as the proposed treatment plan, please see below.  Controlled Substance Pharmacotherapy Assessment REMS (Risk Evaluation and Mitigation Strategy)  Analgesic: Hydrocodone 7.5 mg 3 times daily as needed, quantity 37-monthMME/day: 22.5 mg/day.  WLandis Martins RN  02/06/2018  2:03 PM  Sign at close encounter Nursing Pain Medication Assessment:  Safety precautions to be maintained throughout the outpatient stay will include: orient to surroundings, keep bed in low position, maintain call bell within reach at all times, provide assistance with transfer out of bed and ambulation.  Medication Inspection Compliance: Pill count conducted under aseptic conditions, in front of the patient. Neither the pills nor the bottle was removed from the patient's sight at any time. Once count was completed pills were immediately returned to the patient in their original bottle.  Medication: Hydrocodone/APAP Pill/Patch Count: 54 of 90 pills remain Pill/Patch Appearance: Markings consistent with prescribed medication Bottle Appearance: Standard pharmacy container. Clearly labeled. Filled Date:01/12/ 2019 Last Medication intake:  Today   Pharmacokinetics: Liberation and absorption  (onset of action): WNL Distribution (time to peak effect): WNL Metabolism and excretion (duration  of action): WNL         Pharmacodynamics: Desired effects: Analgesia: Dylan Quinn reports >50% benefit. Functional ability: Patient reports that medication allows him to accomplish basic ADLs Clinically meaningful improvement in function (CMIF): Sustained CMIF goals met Perceived effectiveness: Described as relatively effective, allowing for increase in activities of daily living (ADL) Undesirable effects: Side-effects or Adverse reactions: None reported Monitoring: [place] PMP: Online review of the past 58-monthperiod conducted. Compliant with practice rules and regulations Last UDS on record: Summary  Date Value Ref Range Status  12/11/2017 FINAL  Final    Comment:    ==================================================================== TOXASSURE COMP DRUG ANALYSIS,UR ==================================================================== Test                             Result       Flag       Units Drug Present and Declared for Prescription Verification   Hydrocodone                    37           EXPECTED   ng/mg creat   Hydromorphone                  72           EXPECTED   ng/mg creat   Norhydrocodone                 84           EXPECTED   ng/mg creat    Sources of hydrocodone include scheduled prescription    medications. Hydromorphone and norhydrocodone are expected    metabolites of hydrocodone. Hydromorphone is also available as a    scheduled prescription medication.   Pregabalin                     PRESENT      EXPECTED   Acetaminophen                  PRESENT      EXPECTED Drug Absent but Declared for Prescription Verification   Tramadol                       Not Detected UNEXPECTED ng/mg creat   Salicylate                     Not Detected UNEXPECTED    Aspirin, as indicated in the declared medication list, is not    always detected even when used as  directed. ==================================================================== Test                      Result    Flag   Units      Ref Range   Creatinine              163              mg/dL      >=20 ==================================================================== Declared Medications:  The flagging and interpretation on this report are based on the  following declared medications.  Unexpected results may arise from  inaccuracies in the declared medications.  **Note: The testing scope of this panel includes these medications:  Hydrocodone (Norco)  Pregabalin (Lyrica)  Tramadol (Ultram)  **Note: The testing scope of this panel does not include small to  moderate amounts of these reported medications:  Acetaminophen (Norco)  Aspirin  **Note: The testing scope of this panel does not include following  reported medications:  Brimonidine Tartrate  Clobetasol (Temovate)  Colchicine  Cyanocobalamin  Docusate  Dorzolamide  Glucagon  Hydrochlorothiazide  Insulin (Humalog)  Insulin (Lantus)  Latanoprost (Xalatan)  Levocetirizine (Xyzal)  Meloxicam (Mobic)  Olmesartan (Benicar)  Polyethylene Glycol (MiraLAX)  Potassium (Klor-Con) ==================================================================== For clinical consultation, please call [phone]. ====================================================================    UDS interpretation: Compliant          Medication Assessment Form: Reviewed. Patient indicates being compliant with therapy Treatment compliance: Compliant Risk Assessment Profile: Aberrant behavior: See prior evaluations. None observed or detected today Comorbid factors increasing risk of overdose: See prior notes. No additional risks detected today Risk of substance use disorder (SUD): Low Opioid Risk Tool - 12/18/17 1020      Family History of Substance Abuse   Alcohol  Negative    Illegal Drugs  Negative    Rx Drugs  Negative      Personal  History of Substance Abuse   Alcohol  Negative    Illegal Drugs  Negative    Rx Drugs  Negative      Psychological Disease   Psychological Disease  Negative    Depression  Negative      Total Score   Opioid Risk Tool Scoring  0    Opioid Risk Interpretation  Low Risk      ORT Scoring interpretation table:  Score <3 = Low Risk for SUD  Score between 4-7 = Moderate Risk for SUD  Score >8 = High Risk for Opioid Abuse   Risk Mitigation Strategies:  Patient Counseling: Covered Patient-Prescriber Agreement (PPA): Present and active  Notification to other healthcare providers: Done  Pharmacologic Plan: No change in therapy, at this time.             Laboratory Chemistry  Inflammation Markers (CRP: Acute Phase) (ESR: Chronic Phase) Lab Results  Component Value Date   ESRSEDRATE 77 (H) 12/15/2016                         Rheumatology Markers No results found for: RF, ANA, LABURIC, URICUR, LYMEIGGIGMAB, [facility]              Renal Function Markers Lab Results  Component Value Date   BUN 16 12/23/2016   CREATININE 1.30 (H) 09/17/2017   GFRAA >60 12/23/2016   GFRNONAA 56 (L) 12/23/2016                 Hepatic Function Markers Lab Results  Component Value Date   AST 15 12/19/2016   ALT 10 (L) 12/19/2016   ALBUMIN 2.4 (L) 12/19/2016   ALKPHOS 72 12/19/2016   AMYLASE 32 12/27/2010   LIPASE 10 (L) 12/18/2016                 Electrolytes Lab Results  Component Value Date   NA 130 (L) 12/23/2016   K 4.9 12/23/2016   CL 98 (L) 12/23/2016   CALCIUM 7.9 (L) 12/23/2016   MG 1.8 12/18/2016                        Neuropathy Markers Lab Results  Component Value Date   VITAMINB12 551 12/29/2015   FOLATE 8.2 12/29/2015   HGBA1C 10.4 10/09/2016   HIV Non Reactive 12/15/2016                 Bone Pathology Markers  Lab Results  Component Value Date   TESTOSTERONE 300.16 (L) 11/24/2010                         Coagulation Parameters Lab Results  Component Value  Date   PLT 462 (H) 12/23/2016                 Cardiovascular Markers Lab Results  Component Value Date   BNP 201.8 (H) 12/15/2016   TROPONINI 0.04 (HH) 12/11/2016   HGB 9.9 (L) 12/23/2016   HCT 29.1 (L) 12/23/2016                 CA Markers No results found for: CEA, CA125, LABCA2               Note: Lab results reviewed.  Recent Diagnostic Imaging Results  CT ABDOMEN PELVIS WO CONTRAST CLINICAL DATA:  82 year old male with weight loss over the past 3 months. Prior appendectomy. Initial encounter.  EXAM: CT ABDOMEN AND PELVIS WITHOUT CONTRAST  TECHNIQUE: Multidetector CT imaging of the abdomen and pelvis was performed following the standard protocol without IV contrast.  COMPARISON:  01/09/2011 CT abdomen and pelvis.  FINDINGS: Lower chest: Elevated left hemidiaphragm. No worrisome lung base abnormality. Heart size within normal limits. Mild gynecomastia.  Hepatobiliary: Slightly lobular contour of the liver without secondary findings of cirrhosis. Right lobe of liver cysts measure up to 9 mm minimally changed from prior exam. Taking into account limitation by non contrast imaging, no worrisome hepatic lesion. No calcified gallstone.  Pancreas: Taking into account limitation by non contrast imaging, no worrisome pancreatic mass or inflammation.  Spleen: Taking into account limitation by non contrast imaging, no mass or enlargement.  Adrenals/Urinary Tract: Several bilateral small nonobstructing renal calculi. Cysts including parapelvic cysts without evidence of hydronephrosis. Taking into account limitation by non contrast imaging, no worrisome renal or adrenal lesion. Partially contracted noncontrast filled urinary bladder without gross abnormality.  Stomach/Bowel: No bowel inflammatory process or obvious mass noted (portions under distended and evaluation slightly limited).  Vascular/Lymphatic: Atherosclerotic changes ectatic aorta without focal aneurysm.  Atherosclerotic changes ectatic iliac arteries.  No adenopathy.  Reproductive: Slightly lobulated prostate gland with minimal impression upon the bladder base.  Other: No bowel containing hernia or free intraperitoneal air.  Musculoskeletal: L4-5 facet degenerative changes and grade 1 slip L4. Marked L4-5 and L5-S1 disc degeneration with endplate sclerosis and irregularity. Vacuum disc at each of these levels. No surrounding soft tissue abnormality to suggest infection.  IMPRESSION: Chronically elevated left hemidiaphragm.  Several bilateral nonobstructing renal calculi.  No obvious bowel mass although evaluation limited by areas of under distention. No bowel inflammatory process noted.  Progressive significant degenerative changes L4-5 and L5-S1.  Slightly lobulated prostate gland with minimal impression upon the bladder base.  Aortic Atherosclerosis (ICD10-I70.0).  Electronically Signed   By: Genia Del M.D.   On: 02/04/2018 07:02  Complexity Note: Imaging results reviewed. Results shared with Dylan Quinn, using Layman's terms.                         Meds   Current Outpatient Medications:  .  brimonidine-timolol (COMBIGAN) 0.2-0.5 % ophthalmic solution, Place 1 drop into both eyes 2 (two) times daily. , Disp: , Rfl:  .  clobetasol cream (TEMOVATE) 1.61 %, Apply 1 application topically 2 (two) times daily as needed (for rash). , Disp: , Rfl:  .  colchicine 0.6 MG tablet, Take  2 tablets by mouth. 2 tablets at first sign of gout flare followed by 1, Disp: , Rfl:  .  Cyanocobalamin (B-12 IJ), Inject 1 mcg as directed every 30 (thirty) days., Disp: , Rfl:  .  Docusate Sodium (COLACE PO), Take 2 capsules by mouth as needed., Disp: , Rfl:  .  dorzolamide (TRUSOPT) 2 % ophthalmic solution, Place 1 drop into both eyes 2 (two) times daily., Disp: , Rfl:  .  glucagon (GLUCAGON EMERGENCY) 1 MG injection, Inject 1 mg into the muscle once as needed., Disp: 2 each, Rfl: 1 .   HYDROcodone-acetaminophen (NORCO) 7.5-325 MG tablet, Take 1 tablet by mouth 3 (three) times daily as needed for moderate pain. For chronic pain To last for 30 days from fill date To fill on or after: 02/21/18, 03/22/18, 04/21/18, Disp: 90 tablet, Rfl: 0 .  Insulin Glargine (LANTUS SOLOSTAR) 100 UNIT/ML Solostar Pen, Inject 11 Units into the skin daily. , Disp: , Rfl:  .  insulin lispro (HUMALOG) 100 UNIT/ML injection, Inject 0-10 Units into the skin 3 (three) times daily before meals. + sliding scale if BG>150, Disp: , Rfl:  .  latanoprost (XALATAN) 0.005 % ophthalmic solution, Place 1 drop into both eyes at bedtime., Disp: , Rfl:  .  olmesartan (BENICAR) 40 MG tablet, Take 1 tablet (40 mg total) by mouth daily., Disp: 90 tablet, Rfl: 3 .  polyethylene glycol (MIRALAX / GLYCOLAX) packet, Take 17 g by mouth daily., Disp: 14 each, Rfl: 0 .  potassium chloride SA (K-DUR,KLOR-CON) 20 MEQ tablet, Take 2 tablets (40 mEq total) by mouth daily., Disp: 30 tablet, Rfl: 2 .  pregabalin (LYRICA) 100 MG capsule, Take 1 capsule (100 mg total) by mouth 2 (two) times daily., Disp: 180 capsule, Rfl: 1 .  traMADol (ULTRAM) 50 MG tablet, Take 1 tablet (50 mg total) by mouth every 6 (six) hours as needed for moderate pain or severe pain., Disp: 30 tablet, Rfl: 0 .  aspirin (GOODSENSE ASPIRIN) 81 MG chewable tablet, Chew 81 mg by mouth daily. , Disp: , Rfl:  .  hydrochlorothiazide (MICROZIDE) 12.5 MG capsule, Take 1 capsule by mouth daily., Disp: , Rfl:  .  levocetirizine (XYZAL) 5 MG tablet, Take 1 tablet by mouth daily. In evening, Disp: , Rfl:  .  meloxicam (MOBIC) 7.5 MG tablet, Take 1 tablet (7.5 mg total) by mouth daily. (Patient not taking: Reported on 01/23/2018), Disp: 30 tablet, Rfl: 3  ROS  Constitutional: Denies any fever or chills Gastrointestinal: No reported hemesis, hematochezia, vomiting, or acute GI distress Musculoskeletal: Denies any acute onset joint swelling, redness, loss of ROM, or  weakness Neurological: No reported episodes of acute onset apraxia, aphasia, dysarthria, agnosia, amnesia, paralysis, loss of coordination, or loss of consciousness  Allergies  Dylan Quinn is allergic to lisinopril.  [place]  Drug: Dylan Quinn  reports that he does not use drugs. Alcohol:  reports that he does not drink alcohol. Tobacco:  reports that  has never smoked. he has never used smokeless tobacco. Medical:  has a past medical history of BENIGN PROSTATIC HYPERTROPHY (07/19/2009), COLONIC POLYPS (02/14/2010), DEPRESSION (03/16/2008), DIABETES MELLITUS, TYPE I (06/30/2007), Dyslipidemia, FOLLICULITIS (07/28/5363), GLAUCOMA (07/19/2009), HEARING LOSS (02/14/2010), HYPERTENSION (05/14/2008), HYPOGONADISM, MALE (12/16/2007), Leukopenia, LUMBAR RADICULOPATHY, LEFT (02/23/2010), Other osteoporosis (12/16/2007), PERIPHERAL NEUROPATHY (06/30/2007), Prostate cancer ([place]), PROTEINURIA, MILD (02/14/2010), Psoriasis, and URINARY CALCULUS (12/27/2010). Surgical: Dylan Quinn  has a past surgical history that includes Cataract extraction (2006); Cataract extraction (1997); Appendectomy (1986); and TEE without cardioversion (N/A, 12/19/2016). Family: family  history includes Cancer in his father; Heart disease in his father; High blood pressure in his unknown relative.  Constitutional Exam  General appearance: Well nourished, well developed, and well hydrated. In no apparent acute distress Vitals:   02/06/18 1353  BP: 123/70  Pulse: 83  Resp: 16  Temp: 97.7 F (36.5 C)  TempSrc: Oral  SpO2: 100%  Weight: 187 lb (84.8 kg)  Height: '6\' 1"'  (1.854 m)   BMI Assessment: Estimated body mass index is 24.67 kg/m as calculated from the following:   Height as of this encounter: '6\' 1"'  (1.854 m).   Weight as of this encounter: 187 lb (84.8 kg).  BMI interpretation table: BMI level Category Range association with higher incidence of chronic pain  <18 kg/m2 Underweight   18.5-24.9 kg/m2 Ideal body weight   25-29.9 kg/m2  Overweight Increased incidence by 20%  30-34.9 kg/m2 Obese (Class I) Increased incidence by 68%  35-39.9 kg/m2 Severe obesity (Class II) Increased incidence by 136%  >40 kg/m2 Extreme obesity (Class III) Increased incidence by 254%   BMI Readings from Last 4 Encounters:  02/06/18 24.67 kg/m  01/23/18 25.01 kg/m  12/18/17 25.31 kg/m  12/11/17 24.08 kg/m   Wt Readings from Last 4 Encounters:  02/06/18 187 lb (84.8 kg)  01/23/18 187 lb (84.8 kg)  12/18/17 184 lb (83.5 kg)  12/11/17 185 lb (83.9 kg)  Psych/Mental status: Alert, oriented x 3 (person, place, & time)       Eyes: PERLA Respiratory: No evidence of acute respiratory distress  Cervical Spine Area Exam  Skin & Axial Inspection: No masses, redness, edema, swelling, or associated skin lesions Alignment: Symmetrical Functional ROM: Unrestricted ROM      Stability: No instability detected Muscle Tone/Strength: Functionally intact. No obvious neuro-muscular anomalies detected. Sensory (Neurological): Unimpaired Palpation: No palpable anomalies              Upper Extremity (UE) Exam    Side: Right upper extremity  Side: Left upper extremity  Skin & Extremity Inspection: Skin color, temperature, and hair growth are WNL. No peripheral edema or cyanosis. No masses, redness, swelling, asymmetry, or associated skin lesions. No contractures.  Skin & Extremity Inspection: Skin color, temperature, and hair growth are WNL. No peripheral edema or cyanosis. No masses, redness, swelling, asymmetry, or associated skin lesions. No contractures.  Functional ROM: Unrestricted ROM          Functional ROM: Unrestricted ROM          Muscle Tone/Strength: Functionally intact. No obvious neuro-muscular anomalies detected.  Muscle Tone/Strength: Functionally intact. No obvious neuro-muscular anomalies detected.  Sensory (Neurological): Unimpaired          Sensory (Neurological): Unimpaired          Palpation: No palpable anomalies               Palpation: No palpable anomalies              Specialized Test(s): Deferred         Specialized Test(s): Deferred          Thoracic Spine Area Exam  Skin & Axial Inspection: No masses, redness, or swelling Alignment: Symmetrical Functional ROM: Unrestricted ROM Stability: No instability detected Muscle Tone/Strength: Functionally intact. No obvious neuro-muscular anomalies detected. Sensory (Neurological): Unimpaired Muscle strength & Tone: No palpable anomalies Lumbar Spine Area Exam  Skin & Axial Inspection:Paravertebral muscle atrophy Alignment:Asymmetric Functional KLK:[id number] ROM Stability:No instability detected Muscle Tone/Strength:Functionally intact. No obvious neuro-muscular anomalies detected. Sensory (Neurological):Movement-associated pain  Palpation:Complains of area being tender to palpationBilateral Fist Percussion Test Provocative Tests: Lumbar Hyperextension and rotation test:Positivebilaterally for facet joint pain. Lumbar Lateral bending test:Positivedue to pain. Patrick's Maneuver:evaluation deferred today  Gait & Posture Assessment  Ambulation:Limited Gait:Antalgic Posture:Difficulty standing up straight, due to pain   Lower Extremity Exam    Side: Right lower extremity  Side: Left lower extremity  Skin & Extremity Inspection: Skin color, temperature, and hair growth are WNL. No peripheral edema or cyanosis. No masses, redness, swelling, asymmetry, or associated skin lesions. No contractures.  Skin & Extremity Inspection: Skin color, temperature, and hair growth are WNL. No peripheral edema or cyanosis. No masses, redness, swelling, asymmetry, or associated skin lesions. No contractures.  Functional ROM: Unrestricted ROM          Functional ROM: Unrestricted ROM          Muscle Tone/Strength: Functionally intact. No obvious neuro-muscular anomalies detected.  Muscle Tone/Strength: Functionally intact. No obvious  neuro-muscular anomalies detected.  Sensory (Neurological): Unimpaired  Sensory (Neurological): Unimpaired  Palpation: No palpable anomalies  Palpation: No palpable anomalies     Assessment  Primary Diagnosis & Pertinent Problem List: The primary encounter diagnosis was Bilateral primary osteoarthritis of knee. Diagnoses of Lumbar radiculopathy, Foraminal stenosis of lumbar region, Lumbar degenerative disc disease, Lumbar spondylosis, Chronic pain syndrome, and Facet arthropathy, lumbar were also pertinent to this visit.  Status Diagnosis  Controlled Controlled Controlled 1. Bilateral primary osteoarthritis of knee   2. Lumbar radiculopathy   3. Foraminal stenosis of lumbar region   4. Lumbar degenerative disc disease   5. Lumbar spondylosis   6. Chronic pain syndrome   7. Facet arthropathy, lumbar      General Recommendations: The pain condition that the patient suffers from is best treated with a multidisciplinary approach that involves an increase in physical activity to prevent de-conditioning and worsening of the pain cycle, as well as psychological counseling (formal and/or informal) to address the co-morbid psychological affects of pain. Treatment will often involve judicious use of pain medications and interventional procedures to decrease the pain, allowing the patient to participate in the physical activity that will ultimately produce long-lasting pain reductions. The goal of the multidisciplinary approach is to return the patient to a higher level of overall function and to restore their ability to perform activities of daily living.   82 year old male with a history of type 1 diabetes who presents with axial low back pain with radiation to bilateral lower extremities thathasworsened over the last 2-4 months. Low back pain with radiation to his legs could be secondary to multilevel lumbar spondylosis, facet arthropathy, subarticular stenosis most pronounced at L4-L5 with  mild disc bulges and mild lateral recess stenosis at L2-L3 and L3-L4.  Patient also has chronic bilateral knee pain secondary to knee osteoarthritis.  He is status post bilateral genicular nerve block performed on 12/18/2017 which were significantly effective for his bilateral knee pain.  Patient is here for medication refill.  He is utilizing his hydrocodone twice daily to 3 times daily as needed given his activity level.  He finds it easier to ambulate with his walker and also perform activities of daily living.  He states that his bowel habits are also more regular by taking Dulcolax as well as occasional MiraLAX.  Patient is pleased with his progress that he has made since October.  Plan: -Refill hydrocodone below 7.5 mg 3 times daily as needed, quantity 90.  Prescription provided for 3 months. -Patient to follow-up in 3  months. -Continue bowel regimen of Dulcolax and MiraLAX as needed  Plan of Care  Pharmacotherapy (Medications Ordered): Meds ordered this encounter  Medications  . DISCONTD: HYDROcodone-acetaminophen (NORCO) 7.5-325 MG tablet    Sig: Take 1 tablet by mouth 3 (three) times daily as needed for moderate pain. For chronic pain To last for 30 days from fill date To fill on or after: 02/21/18, 03/22/18, 04/21/18    Dispense:  90 tablet    Refill:  0    Do not place this medication, or any other prescription from our practice, on "Automatic Refill". Patient may have prescription filled one day early if pharmacy is closed on scheduled refill date.  Marland Kitchen DISCONTD: HYDROcodone-acetaminophen (NORCO) 7.5-325 MG tablet    Sig: Take 1 tablet by mouth 3 (three) times daily as needed for moderate pain. For chronic pain To last for 30 days from fill date To fill on or after: 02/21/18, 03/22/18, 04/21/18    Dispense:  90 tablet    Refill:  0    Do not place this medication, or any other prescription from our practice, on "Automatic Refill". Patient may have prescription filled one day early if  pharmacy is closed on scheduled refill date.  Marland Kitchen HYDROcodone-acetaminophen (NORCO) 7.5-325 MG tablet    Sig: Take 1 tablet by mouth 3 (three) times daily as needed for moderate pain. For chronic pain To last for 30 days from fill date To fill on or after: 02/21/18, 03/22/18, 04/21/18    Dispense:  90 tablet    Refill:  0    Do not place this medication, or any other prescription from our practice, on "Automatic Refill". Patient may have prescription filled one day early if pharmacy is closed on scheduled refill date.   Time Note: Greater than 50% of the 25 minute(s) of face-to-face time spent with Dylan Quinn, was spent in counseling/coordination of care regarding: opioid tolerance, Dylan Quinn primary cause of pain, the treatment plan, treatment alternatives, medication side effects, the opioid analgesic risks and possible complications, the appropriate use of his medications, realistic expectations and the medication agreement.  Provider-requested follow-up: Return in about 4 months (around 06/06/2018) for Medication Management.  Future Appointments  Date Time Provider [place]  06/02/2018  1:30 PM Gillis Santa, MD [facility] None    Primary Care Physician: Tracie Harrier, MD Location: [facility] Note by: Gillis Santa, M.D Date: 02/06/2018; Time: 2:55 PM  Patient Instructions  You were given 3 prescriptions for Hydrocodone today.

## 2018-02-07 DIAGNOSIS — M48061 Spinal stenosis, lumbar region without neurogenic claudication: Secondary | ICD-10-CM | POA: Diagnosis not present

## 2018-02-07 DIAGNOSIS — H9190 Unspecified hearing loss, unspecified ear: Secondary | ICD-10-CM | POA: Diagnosis not present

## 2018-02-07 DIAGNOSIS — E1021 Type 1 diabetes mellitus with diabetic nephropathy: Secondary | ICD-10-CM | POA: Diagnosis not present

## 2018-02-07 DIAGNOSIS — E1042 Type 1 diabetes mellitus with diabetic polyneuropathy: Secondary | ICD-10-CM | POA: Diagnosis not present

## 2018-02-07 DIAGNOSIS — Z79891 Long term (current) use of opiate analgesic: Secondary | ICD-10-CM | POA: Diagnosis not present

## 2018-02-07 DIAGNOSIS — M810 Age-related osteoporosis without current pathological fracture: Secondary | ICD-10-CM | POA: Diagnosis not present

## 2018-02-07 DIAGNOSIS — G894 Chronic pain syndrome: Secondary | ICD-10-CM | POA: Diagnosis not present

## 2018-02-07 DIAGNOSIS — J449 Chronic obstructive pulmonary disease, unspecified: Secondary | ICD-10-CM | POA: Diagnosis not present

## 2018-02-07 DIAGNOSIS — I10 Essential (primary) hypertension: Secondary | ICD-10-CM | POA: Diagnosis not present

## 2018-02-07 DIAGNOSIS — M170 Bilateral primary osteoarthritis of knee: Secondary | ICD-10-CM | POA: Diagnosis not present

## 2018-02-07 DIAGNOSIS — H409 Unspecified glaucoma: Secondary | ICD-10-CM | POA: Diagnosis not present

## 2018-02-07 DIAGNOSIS — M5116 Intervertebral disc disorders with radiculopathy, lumbar region: Secondary | ICD-10-CM | POA: Diagnosis not present

## 2018-02-11 DIAGNOSIS — Z79891 Long term (current) use of opiate analgesic: Secondary | ICD-10-CM | POA: Diagnosis not present

## 2018-02-11 DIAGNOSIS — H409 Unspecified glaucoma: Secondary | ICD-10-CM | POA: Diagnosis not present

## 2018-02-11 DIAGNOSIS — H9190 Unspecified hearing loss, unspecified ear: Secondary | ICD-10-CM | POA: Diagnosis not present

## 2018-02-11 DIAGNOSIS — M170 Bilateral primary osteoarthritis of knee: Secondary | ICD-10-CM | POA: Diagnosis not present

## 2018-02-11 DIAGNOSIS — E1042 Type 1 diabetes mellitus with diabetic polyneuropathy: Secondary | ICD-10-CM | POA: Diagnosis not present

## 2018-02-11 DIAGNOSIS — M810 Age-related osteoporosis without current pathological fracture: Secondary | ICD-10-CM | POA: Diagnosis not present

## 2018-02-11 DIAGNOSIS — I10 Essential (primary) hypertension: Secondary | ICD-10-CM | POA: Diagnosis not present

## 2018-02-11 DIAGNOSIS — M5116 Intervertebral disc disorders with radiculopathy, lumbar region: Secondary | ICD-10-CM | POA: Diagnosis not present

## 2018-02-11 DIAGNOSIS — E1021 Type 1 diabetes mellitus with diabetic nephropathy: Secondary | ICD-10-CM | POA: Diagnosis not present

## 2018-02-11 DIAGNOSIS — G894 Chronic pain syndrome: Secondary | ICD-10-CM | POA: Diagnosis not present

## 2018-02-11 DIAGNOSIS — M48061 Spinal stenosis, lumbar region without neurogenic claudication: Secondary | ICD-10-CM | POA: Diagnosis not present

## 2018-02-11 DIAGNOSIS — J449 Chronic obstructive pulmonary disease, unspecified: Secondary | ICD-10-CM | POA: Diagnosis not present

## 2018-02-13 DIAGNOSIS — Z79891 Long term (current) use of opiate analgesic: Secondary | ICD-10-CM | POA: Diagnosis not present

## 2018-02-13 DIAGNOSIS — J449 Chronic obstructive pulmonary disease, unspecified: Secondary | ICD-10-CM | POA: Diagnosis not present

## 2018-02-13 DIAGNOSIS — G894 Chronic pain syndrome: Secondary | ICD-10-CM | POA: Diagnosis not present

## 2018-02-13 DIAGNOSIS — E1021 Type 1 diabetes mellitus with diabetic nephropathy: Secondary | ICD-10-CM | POA: Diagnosis not present

## 2018-02-13 DIAGNOSIS — E1042 Type 1 diabetes mellitus with diabetic polyneuropathy: Secondary | ICD-10-CM | POA: Diagnosis not present

## 2018-02-13 DIAGNOSIS — M48061 Spinal stenosis, lumbar region without neurogenic claudication: Secondary | ICD-10-CM | POA: Diagnosis not present

## 2018-02-13 DIAGNOSIS — M810 Age-related osteoporosis without current pathological fracture: Secondary | ICD-10-CM | POA: Diagnosis not present

## 2018-02-13 DIAGNOSIS — H9190 Unspecified hearing loss, unspecified ear: Secondary | ICD-10-CM | POA: Diagnosis not present

## 2018-02-13 DIAGNOSIS — M170 Bilateral primary osteoarthritis of knee: Secondary | ICD-10-CM | POA: Diagnosis not present

## 2018-02-13 DIAGNOSIS — H409 Unspecified glaucoma: Secondary | ICD-10-CM | POA: Diagnosis not present

## 2018-02-13 DIAGNOSIS — I10 Essential (primary) hypertension: Secondary | ICD-10-CM | POA: Diagnosis not present

## 2018-02-13 DIAGNOSIS — M5116 Intervertebral disc disorders with radiculopathy, lumbar region: Secondary | ICD-10-CM | POA: Diagnosis not present

## 2018-02-16 DIAGNOSIS — G4733 Obstructive sleep apnea (adult) (pediatric): Secondary | ICD-10-CM | POA: Diagnosis not present

## 2018-02-18 DIAGNOSIS — H409 Unspecified glaucoma: Secondary | ICD-10-CM | POA: Diagnosis not present

## 2018-02-18 DIAGNOSIS — H9190 Unspecified hearing loss, unspecified ear: Secondary | ICD-10-CM | POA: Diagnosis not present

## 2018-02-18 DIAGNOSIS — G894 Chronic pain syndrome: Secondary | ICD-10-CM | POA: Diagnosis not present

## 2018-02-18 DIAGNOSIS — E1021 Type 1 diabetes mellitus with diabetic nephropathy: Secondary | ICD-10-CM | POA: Diagnosis not present

## 2018-02-18 DIAGNOSIS — M48061 Spinal stenosis, lumbar region without neurogenic claudication: Secondary | ICD-10-CM | POA: Diagnosis not present

## 2018-02-18 DIAGNOSIS — Z79891 Long term (current) use of opiate analgesic: Secondary | ICD-10-CM | POA: Diagnosis not present

## 2018-02-18 DIAGNOSIS — I10 Essential (primary) hypertension: Secondary | ICD-10-CM | POA: Diagnosis not present

## 2018-02-18 DIAGNOSIS — J449 Chronic obstructive pulmonary disease, unspecified: Secondary | ICD-10-CM | POA: Diagnosis not present

## 2018-02-18 DIAGNOSIS — M810 Age-related osteoporosis without current pathological fracture: Secondary | ICD-10-CM | POA: Diagnosis not present

## 2018-02-18 DIAGNOSIS — M170 Bilateral primary osteoarthritis of knee: Secondary | ICD-10-CM | POA: Diagnosis not present

## 2018-02-18 DIAGNOSIS — M5116 Intervertebral disc disorders with radiculopathy, lumbar region: Secondary | ICD-10-CM | POA: Diagnosis not present

## 2018-02-18 DIAGNOSIS — E1042 Type 1 diabetes mellitus with diabetic polyneuropathy: Secondary | ICD-10-CM | POA: Diagnosis not present

## 2018-02-20 DIAGNOSIS — M5116 Intervertebral disc disorders with radiculopathy, lumbar region: Secondary | ICD-10-CM | POA: Diagnosis not present

## 2018-02-20 DIAGNOSIS — G894 Chronic pain syndrome: Secondary | ICD-10-CM | POA: Diagnosis not present

## 2018-02-20 DIAGNOSIS — I10 Essential (primary) hypertension: Secondary | ICD-10-CM | POA: Diagnosis not present

## 2018-02-20 DIAGNOSIS — J449 Chronic obstructive pulmonary disease, unspecified: Secondary | ICD-10-CM | POA: Diagnosis not present

## 2018-02-20 DIAGNOSIS — M48061 Spinal stenosis, lumbar region without neurogenic claudication: Secondary | ICD-10-CM | POA: Diagnosis not present

## 2018-02-20 DIAGNOSIS — M170 Bilateral primary osteoarthritis of knee: Secondary | ICD-10-CM | POA: Diagnosis not present

## 2018-02-20 DIAGNOSIS — H9190 Unspecified hearing loss, unspecified ear: Secondary | ICD-10-CM | POA: Diagnosis not present

## 2018-02-20 DIAGNOSIS — E1042 Type 1 diabetes mellitus with diabetic polyneuropathy: Secondary | ICD-10-CM | POA: Diagnosis not present

## 2018-02-20 DIAGNOSIS — E1021 Type 1 diabetes mellitus with diabetic nephropathy: Secondary | ICD-10-CM | POA: Diagnosis not present

## 2018-02-20 DIAGNOSIS — Z79891 Long term (current) use of opiate analgesic: Secondary | ICD-10-CM | POA: Diagnosis not present

## 2018-02-20 DIAGNOSIS — H409 Unspecified glaucoma: Secondary | ICD-10-CM | POA: Diagnosis not present

## 2018-02-20 DIAGNOSIS — M810 Age-related osteoporosis without current pathological fracture: Secondary | ICD-10-CM | POA: Diagnosis not present

## 2018-02-21 DIAGNOSIS — Z794 Long term (current) use of insulin: Secondary | ICD-10-CM | POA: Diagnosis not present

## 2018-02-21 DIAGNOSIS — E1165 Type 2 diabetes mellitus with hyperglycemia: Secondary | ICD-10-CM | POA: Diagnosis not present

## 2018-03-04 DIAGNOSIS — H401133 Primary open-angle glaucoma, bilateral, severe stage: Secondary | ICD-10-CM | POA: Diagnosis not present

## 2018-03-06 DIAGNOSIS — E559 Vitamin D deficiency, unspecified: Secondary | ICD-10-CM | POA: Diagnosis not present

## 2018-03-06 DIAGNOSIS — I10 Essential (primary) hypertension: Secondary | ICD-10-CM | POA: Diagnosis not present

## 2018-03-06 DIAGNOSIS — E1159 Type 2 diabetes mellitus with other circulatory complications: Secondary | ICD-10-CM | POA: Diagnosis not present

## 2018-03-06 DIAGNOSIS — E1040 Type 1 diabetes mellitus with diabetic neuropathy, unspecified: Secondary | ICD-10-CM | POA: Diagnosis not present

## 2018-03-16 DIAGNOSIS — G4733 Obstructive sleep apnea (adult) (pediatric): Secondary | ICD-10-CM | POA: Diagnosis not present

## 2018-03-24 DIAGNOSIS — Z794 Long term (current) use of insulin: Secondary | ICD-10-CM | POA: Diagnosis not present

## 2018-03-24 DIAGNOSIS — E1165 Type 2 diabetes mellitus with hyperglycemia: Secondary | ICD-10-CM | POA: Diagnosis not present

## 2018-04-10 DIAGNOSIS — E1040 Type 1 diabetes mellitus with diabetic neuropathy, unspecified: Secondary | ICD-10-CM | POA: Diagnosis not present

## 2018-04-10 DIAGNOSIS — E1159 Type 2 diabetes mellitus with other circulatory complications: Secondary | ICD-10-CM | POA: Diagnosis not present

## 2018-04-10 DIAGNOSIS — E559 Vitamin D deficiency, unspecified: Secondary | ICD-10-CM | POA: Diagnosis not present

## 2018-04-10 DIAGNOSIS — I10 Essential (primary) hypertension: Secondary | ICD-10-CM | POA: Diagnosis not present

## 2018-04-16 DIAGNOSIS — G4733 Obstructive sleep apnea (adult) (pediatric): Secondary | ICD-10-CM | POA: Diagnosis not present

## 2018-04-23 DIAGNOSIS — E1165 Type 2 diabetes mellitus with hyperglycemia: Secondary | ICD-10-CM | POA: Diagnosis not present

## 2018-04-23 DIAGNOSIS — Z794 Long term (current) use of insulin: Secondary | ICD-10-CM | POA: Diagnosis not present

## 2018-04-25 DIAGNOSIS — G4733 Obstructive sleep apnea (adult) (pediatric): Secondary | ICD-10-CM | POA: Diagnosis not present

## 2018-04-28 DIAGNOSIS — E1159 Type 2 diabetes mellitus with other circulatory complications: Secondary | ICD-10-CM | POA: Diagnosis not present

## 2018-04-28 DIAGNOSIS — C61 Malignant neoplasm of prostate: Secondary | ICD-10-CM | POA: Diagnosis not present

## 2018-04-28 DIAGNOSIS — Z0000 Encounter for general adult medical examination without abnormal findings: Secondary | ICD-10-CM | POA: Diagnosis not present

## 2018-04-28 DIAGNOSIS — E1040 Type 1 diabetes mellitus with diabetic neuropathy, unspecified: Secondary | ICD-10-CM | POA: Diagnosis not present

## 2018-04-28 DIAGNOSIS — R05 Cough: Secondary | ICD-10-CM | POA: Diagnosis not present

## 2018-05-16 DIAGNOSIS — G4733 Obstructive sleep apnea (adult) (pediatric): Secondary | ICD-10-CM | POA: Diagnosis not present

## 2018-05-21 DIAGNOSIS — M109 Gout, unspecified: Secondary | ICD-10-CM | POA: Diagnosis not present

## 2018-05-21 DIAGNOSIS — E1122 Type 2 diabetes mellitus with diabetic chronic kidney disease: Secondary | ICD-10-CM | POA: Diagnosis not present

## 2018-05-21 DIAGNOSIS — N183 Chronic kidney disease, stage 3 (moderate): Secondary | ICD-10-CM | POA: Diagnosis not present

## 2018-05-21 DIAGNOSIS — I129 Hypertensive chronic kidney disease with stage 1 through stage 4 chronic kidney disease, or unspecified chronic kidney disease: Secondary | ICD-10-CM | POA: Diagnosis not present

## 2018-05-21 DIAGNOSIS — R809 Proteinuria, unspecified: Secondary | ICD-10-CM | POA: Diagnosis not present

## 2018-06-02 ENCOUNTER — Ambulatory Visit
Payer: Medicare Other | Attending: Student in an Organized Health Care Education/Training Program | Admitting: Nurse Practitioner

## 2018-06-02 ENCOUNTER — Encounter: Payer: Self-pay | Admitting: Nurse Practitioner

## 2018-06-02 VITALS — BP 128/72 | HR 72 | Temp 97.7°F | Resp 16 | Ht 72.5 in | Wt 185.0 lb

## 2018-06-02 DIAGNOSIS — G894 Chronic pain syndrome: Secondary | ICD-10-CM | POA: Insufficient documentation

## 2018-06-02 DIAGNOSIS — H9190 Unspecified hearing loss, unspecified ear: Secondary | ICD-10-CM | POA: Diagnosis not present

## 2018-06-02 DIAGNOSIS — Z8249 Family history of ischemic heart disease and other diseases of the circulatory system: Secondary | ICD-10-CM | POA: Diagnosis not present

## 2018-06-02 DIAGNOSIS — M25561 Pain in right knee: Secondary | ICD-10-CM | POA: Diagnosis not present

## 2018-06-02 DIAGNOSIS — E1021 Type 1 diabetes mellitus with diabetic nephropathy: Secondary | ICD-10-CM | POA: Diagnosis not present

## 2018-06-02 DIAGNOSIS — M5116 Intervertebral disc disorders with radiculopathy, lumbar region: Secondary | ICD-10-CM | POA: Diagnosis not present

## 2018-06-02 DIAGNOSIS — E785 Hyperlipidemia, unspecified: Secondary | ICD-10-CM | POA: Insufficient documentation

## 2018-06-02 DIAGNOSIS — C61 Malignant neoplasm of prostate: Secondary | ICD-10-CM | POA: Insufficient documentation

## 2018-06-02 DIAGNOSIS — H401132 Primary open-angle glaucoma, bilateral, moderate stage: Secondary | ICD-10-CM | POA: Insufficient documentation

## 2018-06-02 DIAGNOSIS — L409 Psoriasis, unspecified: Secondary | ICD-10-CM | POA: Diagnosis not present

## 2018-06-02 DIAGNOSIS — E1042 Type 1 diabetes mellitus with diabetic polyneuropathy: Secondary | ICD-10-CM | POA: Diagnosis not present

## 2018-06-02 DIAGNOSIS — R7881 Bacteremia: Secondary | ICD-10-CM | POA: Diagnosis not present

## 2018-06-02 DIAGNOSIS — I152 Hypertension secondary to endocrine disorders: Secondary | ICD-10-CM | POA: Diagnosis not present

## 2018-06-02 DIAGNOSIS — Z87442 Personal history of urinary calculi: Secondary | ICD-10-CM | POA: Insufficient documentation

## 2018-06-02 DIAGNOSIS — Z888 Allergy status to other drugs, medicaments and biological substances status: Secondary | ICD-10-CM | POA: Diagnosis not present

## 2018-06-02 DIAGNOSIS — G4700 Insomnia, unspecified: Secondary | ICD-10-CM | POA: Insufficient documentation

## 2018-06-02 DIAGNOSIS — F329 Major depressive disorder, single episode, unspecified: Secondary | ICD-10-CM | POA: Diagnosis not present

## 2018-06-02 DIAGNOSIS — M1711 Unilateral primary osteoarthritis, right knee: Secondary | ICD-10-CM | POA: Insufficient documentation

## 2018-06-02 DIAGNOSIS — Z86010 Personal history of colonic polyps: Secondary | ICD-10-CM | POA: Diagnosis not present

## 2018-06-02 DIAGNOSIS — Z79899 Other long term (current) drug therapy: Secondary | ICD-10-CM | POA: Diagnosis not present

## 2018-06-02 DIAGNOSIS — M48061 Spinal stenosis, lumbar region without neurogenic claudication: Secondary | ICD-10-CM | POA: Diagnosis not present

## 2018-06-02 DIAGNOSIS — E559 Vitamin D deficiency, unspecified: Secondary | ICD-10-CM | POA: Insufficient documentation

## 2018-06-02 DIAGNOSIS — Z794 Long term (current) use of insulin: Secondary | ICD-10-CM | POA: Insufficient documentation

## 2018-06-02 DIAGNOSIS — N400 Enlarged prostate without lower urinary tract symptoms: Secondary | ICD-10-CM | POA: Insufficient documentation

## 2018-06-02 DIAGNOSIS — J449 Chronic obstructive pulmonary disease, unspecified: Secondary | ICD-10-CM | POA: Insufficient documentation

## 2018-06-02 DIAGNOSIS — G8929 Other chronic pain: Secondary | ICD-10-CM

## 2018-06-02 DIAGNOSIS — M5416 Radiculopathy, lumbar region: Secondary | ICD-10-CM

## 2018-06-02 DIAGNOSIS — M810 Age-related osteoporosis without current pathological fracture: Secondary | ICD-10-CM | POA: Diagnosis not present

## 2018-06-02 DIAGNOSIS — M5136 Other intervertebral disc degeneration, lumbar region: Secondary | ICD-10-CM

## 2018-06-02 MED ORDER — HYDROCODONE-ACETAMINOPHEN 7.5-325 MG PO TABS: 1.0000 | ORAL_TABLET | Freq: Three times a day (TID) | ORAL | 0 refills | Status: DC | PRN

## 2018-06-02 MED ORDER — HYDROCODONE-ACETAMINOPHEN 7.5-325 MG PO TABS: 1.0000 | ORAL_TABLET | Freq: Three times a day (TID) | ORAL | 0 refills | Status: AC | PRN

## 2018-06-02 NOTE — Progress Notes (Signed)
Nursing Pain Medication Assessment:  Safety precautions to be maintained throughout the outpatient stay will include: orient to surroundings, keep bed in low position, maintain call bell within reach at all times, provide assistance with transfer out of bed and ambulation.  Medication Inspection Compliance: Pill count conducted under aseptic conditions, in front of the patient. Neither the pills nor the bottle was removed from the patient's sight at any time. Once count was completed pills were immediately returned to the patient in their original bottle.  Medication: Hydrocodone/APAP Pill/Patch Count: 78 of 90 pills remain Pill/Patch Appearance: Markings consistent with prescribed medication Bottle Appearance: Standard pharmacy container. Clearly labeled. Filled Date: 04 / 15 / 2019 Last Medication intake:  Today

## 2018-06-02 NOTE — Progress Notes (Signed)
Patient's Name: Dylan Quinn  MRN: [id number]  Referring Provider: Tracie Harrier, MD  DOB: 1936-11-15  PCP: Tracie Harrier, MD  DOS: 06/02/2018  Note by: Vevelyn Francois NP  Service setting: Ambulatory outpatient  Specialty: Interventional Pain Management  Location: [facility] (AMB) Pain Management Facility    Patient type: Established    Primary Reason(s) for Visit: Encounter for prescription drug management. (Level of risk: moderate)  CC: Knee Pain (right )  HPI  Mr. Gruenewald is a 82 y.o. year old, male patient, who comes today for a medication management evaluation. 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 ([place]); PERIPHERAL NEUROPATHY; Glaucoma; HEARING LOSS; Hypertension associated with diabetes ([place]); Prostate cancer ([place]); Other osteoporosis; PROTEINURIA, MILD; Insomnia; Normocytic anemia; URI (upper respiratory infection); Type 1 diabetes mellitus with nephropathy ([place]); COPD (chronic obstructive pulmonary disease) ([place]); Right knee pain; Primary osteoarthritis of right knee; Diabetic ketoacidosis without coma associated with type 1 diabetes mellitus ([place]); Nausea & vomiting; DKA, type 1 ([place]); Influenza with pneumonia; Staphylococcus aureus bacteremia; Rash and nonspecific skin eruption; Lumbar radiculopathy; Lumbar degenerative disc disease; Foraminal stenosis of lumbar region; Primary open angle glaucoma of both eyes, moderate stage; Vitamin D deficiency; Type 1 diabetes mellitus with diabetic neuropathy (HCC); and Chronic pain syndrome on their problem list. His primarily concern today is the Knee Pain (right )  Pain Assessment: Location: Right Knee Radiating: na Onset: More than a month ago Duration: Chronic pain Quality: Discomfort, Sore, Other (Comment)(tender) Severity: 5 /10 (subjective, self-reported pain score)  Note: Reported level is compatible with observation.                          Effect on ADL: medication allows him to more than he would  otherwise be able to.  does short durations of activity Timing: Intermittent(only when he stands up) Modifying factors: medication is helping take the edge off the pain in the knee BP: 128/72  HR: 72  Mr. Lazaro was last scheduled for an appointment on Visit date not found for medication management. During today's appointment we reviewed Mr. Hartel chronic pain status, as well as his outpatient medication regimen. He admits that he continues to have the right knee pain. He  Admits that some days are worse than others. He also admits that standing for long periods makes his pain worse. He is not doing a lot of walking or standing secondary to the pain. He is SP Genicular NB he admits that is helped to left knee but the right is still painful. He only uses the Norco prn.   The patient  reports that he does not use drugs. His body mass index is 24.75 kg/m.  Further details on both, my assessment(s), as well as the proposed treatment plan, please see below.  Controlled Substance Pharmacotherapy Assessment REMS (Risk Evaluation and Mitigation Strategy)  Analgesic: Hydrocodone 7.5 mg 3 times daily as needed, quantity 20-monthMME/day: 22.5 mg/day.    PJanett Billow RN  06/02/2018  1:51 PM  Sign at close encounter Nursing Pain Medication Assessment:  Safety precautions to be maintained throughout the outpatient stay will include: orient to surroundings, keep bed in low position, maintain call bell within reach at all times, provide assistance with transfer out of bed and ambulation.  Medication Inspection Compliance: Pill count conducted under aseptic conditions, in front of the patient. Neither the pills nor the bottle was removed from the patient's sight at any time. Once count was completed  pills were immediately returned to the patient in their original bottle.  Medication: Hydrocodone/APAP Pill/Patch Count: 78 of 90 pills remain Pill/Patch Appearance: Markings consistent with  prescribed medication Bottle Appearance: Standard pharmacy container. Clearly labeled. Filled Date: 04 / 15 / 2019 Last Medication intake:  Today   Pharmacokinetics: Liberation and absorption (onset of action): WNL Distribution (time to peak effect): WNL Metabolism and excretion (duration of action): WNL         Pharmacodynamics: Desired effects: Analgesia: Mr. Rokosz reports >50% benefit. Functional ability: Patient reports that medication allows him to accomplish basic ADLs Clinically meaningful improvement in function (CMIF): Sustained CMIF goals met Perceived effectiveness: Described as relatively effective, allowing for increase in activities of daily living (ADL) Undesirable effects: Side-effects or Adverse reactions: None reported Monitoring: [place] PMP: Online review of the past 49-monthperiod conducted. Compliant with practice rules and regulations Last UDS on record: Summary  Date Value Ref Range Status  12/11/2017 FINAL  Final    Comment:    ==================================================================== TOXASSURE COMP DRUG ANALYSIS,UR ==================================================================== Test                             Result       Flag       Units Drug Present and Declared for Prescription Verification   Hydrocodone                    37           EXPECTED   ng/mg creat   Hydromorphone                  72           EXPECTED   ng/mg creat   Norhydrocodone                 84           EXPECTED   ng/mg creat    Sources of hydrocodone include scheduled prescription    medications. Hydromorphone and norhydrocodone are expected    metabolites of hydrocodone. Hydromorphone is also available as a    scheduled prescription medication.   Pregabalin                     PRESENT      EXPECTED   Acetaminophen                  PRESENT      EXPECTED Drug Absent but Declared for Prescription Verification   Tramadol                       Not Detected UNEXPECTED  ng/mg creat   Salicylate                     Not Detected UNEXPECTED    Aspirin, as indicated in the declared medication list, is not    always detected even when used as directed. ==================================================================== Test                      Result    Flag   Units      Ref Range   Creatinine              163              mg/dL      >=20 ==================================================================== Declared Medications:  The flagging  and interpretation on this report are based on the  following declared medications.  Unexpected results may arise from  inaccuracies in the declared medications.  **Note: The testing scope of this panel includes these medications:  Hydrocodone (Norco)  Pregabalin (Lyrica)  Tramadol (Ultram)  **Note: The testing scope of this panel does not include small to  moderate amounts of these reported medications:  Acetaminophen (Norco)  Aspirin  **Note: The testing scope of this panel does not include following  reported medications:  Brimonidine Tartrate  Clobetasol (Temovate)  Colchicine  Cyanocobalamin  Docusate  Dorzolamide  Glucagon  Hydrochlorothiazide  Insulin (Humalog)  Insulin (Lantus)  Latanoprost (Xalatan)  Levocetirizine (Xyzal)  Meloxicam (Mobic)  Olmesartan (Benicar)  Polyethylene Glycol (MiraLAX)  Potassium (Klor-Con) ==================================================================== For clinical consultation, please call [phone]. ====================================================================    UDS interpretation: Compliant          Medication Assessment Form: Reviewed. Patient indicates being compliant with therapy Treatment compliance: Compliant Risk Assessment Profile: Aberrant behavior: See prior evaluations. None observed or detected today Comorbid factors increasing risk of overdose: See prior notes. No additional risks detected today Risk of substance use disorder (SUD):  Low Opioid Risk Tool - 06/02/18 1347      Family History of Substance Abuse   Alcohol  Negative    Illegal Drugs  Positive Male    Rx Drugs  Negative      Personal History of Substance Abuse   Alcohol  Negative    Illegal Drugs  Negative    Rx Drugs  Negative      Psychological Disease   Psychological Disease  Negative    Depression  Negative      Total Score   Opioid Risk Tool Scoring  3    Opioid Risk Interpretation  Low Risk      ORT Scoring interpretation table:  Score <3 = Low Risk for SUD  Score between 4-7 = Moderate Risk for SUD  Score >8 = High Risk for Opioid Abuse   Risk Mitigation Strategies:  Patient Counseling: Covered Patient-Prescriber Agreement (PPA): Present and active  Notification to other healthcare providers: Done  Pharmacologic Plan: No change in therapy, at this time.             Laboratory Chemistry  Inflammation Markers (CRP: Acute Phase) (ESR: Chronic Phase) Lab Results  Component Value Date   ESRSEDRATE 77 (H) 12/15/2016                         Rheumatology Markers No results found for: RF, ANA, LABURIC, URICUR, LYMEIGGIGMAB, LYMEABIGMQN, HLAB27                      Renal Function Markers Lab Results  Component Value Date   BUN 16 12/23/2016   CREATININE 1.30 (H) 09/17/2017   GFRAA >60 12/23/2016   GFRNONAA 56 (L) 12/23/2016                             Hepatic Function Markers Lab Results  Component Value Date   AST 15 12/19/2016   ALT 10 (L) 12/19/2016   ALBUMIN 2.4 (L) 12/19/2016   ALKPHOS 72 12/19/2016   AMYLASE 32 12/27/2010   LIPASE 10 (L) 12/18/2016                        Electrolytes Lab Results  Component Value Date  NA 130 (L) 12/23/2016   K 4.9 12/23/2016   CL 98 (L) 12/23/2016   CALCIUM 7.9 (L) 12/23/2016   MG 1.8 12/18/2016                        Neuropathy Markers Lab Results  Component Value Date   VITAMINB12 551 12/29/2015   FOLATE 8.2 12/29/2015   HGBA1C 10.4 10/09/2016   HIV Non Reactive  12/15/2016                        Bone Pathology Markers Lab Results  Component Value Date   TESTOSTERONE 300.16 (L) 11/24/2010                         Coagulation Parameters Lab Results  Component Value Date   PLT 462 (H) 12/23/2016                        Cardiovascular Markers Lab Results  Component Value Date   BNP 201.8 (H) 12/15/2016   TROPONINI 0.04 (HH) 12/11/2016   HGB 9.9 (L) 12/23/2016   HCT 29.1 (L) 12/23/2016                         CA Markers No results found for: CEA, CA125, LABCA2                      Note: Lab results reviewed.  Recent Diagnostic Imaging Results  CT ABDOMEN PELVIS WO CONTRAST CLINICAL DATA:  82 year old male with weight loss over the past 3 months. Prior appendectomy. Initial encounter.  EXAM: CT ABDOMEN AND PELVIS WITHOUT CONTRAST  TECHNIQUE: Multidetector CT imaging of the abdomen and pelvis was performed following the standard protocol without IV contrast.  COMPARISON:  01/09/2011 CT abdomen and pelvis.  FINDINGS: Lower chest: Elevated left hemidiaphragm. No worrisome lung base abnormality. Heart size within normal limits. Mild gynecomastia.  Hepatobiliary: Slightly lobular contour of the liver without secondary findings of cirrhosis. Right lobe of liver cysts measure up to 9 mm minimally changed from prior exam. Taking into account limitation by non contrast imaging, no worrisome hepatic lesion. No calcified gallstone.  Pancreas: Taking into account limitation by non contrast imaging, no worrisome pancreatic mass or inflammation.  Spleen: Taking into account limitation by non contrast imaging, no mass or enlargement.  Adrenals/Urinary Tract: Several bilateral small nonobstructing renal calculi. Cysts including parapelvic cysts without evidence of hydronephrosis. Taking into account limitation by non contrast imaging, no worrisome renal or adrenal lesion. Partially contracted noncontrast filled urinary bladder without  gross abnormality.  Stomach/Bowel: No bowel inflammatory process or obvious mass noted (portions under distended and evaluation slightly limited).  Vascular/Lymphatic: Atherosclerotic changes ectatic aorta without focal aneurysm. Atherosclerotic changes ectatic iliac arteries.  No adenopathy.  Reproductive: Slightly lobulated prostate gland with minimal impression upon the bladder base.  Other: No bowel containing hernia or free intraperitoneal air.  Musculoskeletal: L4-5 facet degenerative changes and grade 1 slip L4. Marked L4-5 and L5-S1 disc degeneration with endplate sclerosis and irregularity. Vacuum disc at each of these levels. No surrounding soft tissue abnormality to suggest infection.  IMPRESSION: Chronically elevated left hemidiaphragm.  Several bilateral nonobstructing renal calculi.  No obvious bowel mass although evaluation limited by areas of under distention. No bowel inflammatory process noted.  Progressive significant degenerative changes L4-5 and L5-S1.  Slightly lobulated prostate  gland with minimal impression upon the bladder base.  Aortic Atherosclerosis (ICD10-I70.0).  Electronically Signed   By: Genia Del M.D.   On: 02/04/2018 07:02  Complexity Note: Imaging results reviewed. Results shared with Mr. Junkin, using Layman's terms.                         Meds   Current Outpatient Medications:  .  brimonidine-timolol (COMBIGAN) 0.2-0.5 % ophthalmic solution, Place 1 drop into both eyes 2 (two) times daily. , Disp: , Rfl:  .  clobetasol cream (TEMOVATE) 3.76 %, Apply 1 application topically 2 (two) times daily as needed (for rash). , Disp: , Rfl:  .  colchicine 0.6 MG tablet, Take 2 tablets by mouth. 2 tablets at first sign of gout flare followed by 1, Disp: , Rfl:  .  Cyanocobalamin (B-12 IJ), Inject 1 mcg as directed every 30 (thirty) days., Disp: , Rfl:  .  Docusate Sodium (COLACE PO), Take 2 capsules by mouth as needed., Disp: , Rfl:  .   dorzolamide (TRUSOPT) 2 % ophthalmic solution, Place 1 drop into both eyes 2 (two) times daily., Disp: , Rfl:  .  glucagon (GLUCAGON EMERGENCY) 1 MG injection, Inject 1 mg into the muscle once as needed., Disp: 2 each, Rfl: 1 .  [START ON 08/01/2018] HYDROcodone-acetaminophen (NORCO) 7.5-325 MG tablet, Take 1 tablet by mouth 3 (three) times daily as needed for moderate pain. For chronic pain To last for 30 days from fill date To fill on or after:08/01/2018, Disp: 90 tablet, Rfl: 0 .  Insulin Glargine (LANTUS SOLOSTAR) 100 UNIT/ML Solostar Pen, Inject 15 Units into the skin daily. , Disp: , Rfl:  .  insulin lispro (HUMALOG) 100 UNIT/ML injection, Inject 0-10 Units into the skin 3 (three) times daily before meals. + sliding scale if BG>150, Disp: , Rfl:  .  latanoprost (XALATAN) 0.005 % ophthalmic solution, Place 1 drop into both eyes at bedtime., Disp: , Rfl:  .  olmesartan (BENICAR) 40 MG tablet, Take 1 tablet (40 mg total) by mouth daily., Disp: 90 tablet, Rfl: 3 .  potassium chloride SA (K-DUR,KLOR-CON) 20 MEQ tablet, Take 2 tablets (40 mEq total) by mouth daily., Disp: 30 tablet, Rfl: 2 .  pregabalin (LYRICA) 100 MG capsule, Take 1 capsule (100 mg total) by mouth 2 (two) times daily., Disp: 180 capsule, Rfl: 1 .  hydrochlorothiazide (MICROZIDE) 12.5 MG capsule, Take 1 capsule by mouth daily., Disp: , Rfl:  .  levocetirizine (XYZAL) 5 MG tablet, Take 1 tablet by mouth daily. In evening, Disp: , Rfl:   ROS  Constitutional: Denies any fever or chills Gastrointestinal: No reported hemesis, hematochezia, vomiting, or acute GI distress Musculoskeletal: Denies any acute onset joint swelling, redness, loss of ROM, or weakness Neurological: No reported episodes of acute onset apraxia, aphasia, dysarthria, agnosia, amnesia, paralysis, loss of coordination, or loss of consciousness  Allergies  Mr. Eckardt is allergic to lisinopril.  [place]  Drug: Mr. Renner  reports that he does not use drugs. Alcohol:   reports that he does not drink alcohol. Tobacco:  reports that he has never smoked. He has never used smokeless tobacco. Medical:  has a past medical history of BENIGN PROSTATIC HYPERTROPHY (07/19/2009), COLONIC POLYPS (02/14/2010), DEPRESSION (03/16/2008), DIABETES MELLITUS, TYPE I (06/30/2007), Dyslipidemia, FOLLICULITIS (01/31/3150), GLAUCOMA (07/19/2009), HEARING LOSS (02/14/2010), HYPERTENSION (05/14/2008), HYPOGONADISM, MALE (12/16/2007), Leukopenia, LUMBAR RADICULOPATHY, LEFT (02/23/2010), Other osteoporosis (12/16/2007), PERIPHERAL NEUROPATHY (06/30/2007), Prostate cancer ([place]), PROTEINURIA, MILD (02/14/2010), Psoriasis, and URINARY CALCULUS (  12/27/2010). Surgical: Mr. Delucia  has a past surgical history that includes Cataract extraction (2006); Cataract extraction (1997); Appendectomy (1986); and TEE without cardioversion (N/A, 12/19/2016). Family: family history includes Cancer in his father; Heart disease in his father; High blood pressure in his unknown relative.  Constitutional Exam  General appearance: Well nourished, well developed, and well hydrated. In no apparent acute distress Vitals:   06/02/18 1332  BP: 128/72  Pulse: 72  Resp: 16  Temp: 97.7 F (36.5 C)  TempSrc: Oral  SpO2: 99%  Weight: 185 lb (83.9 kg)  Height: 6' 0.5" (1.842 m)   BMI Assessment: Estimated body mass index is 24.75 kg/m as calculated from the following:   Height as of this encounter: 6' 0.5" (1.842 m).   Weight as of this encounter: 185 lb (83.9 kg). Psych/Mental status: Alert, oriented x 3 (person, place, & time)       Eyes: PERLA Respiratory: No evidence of acute respiratory distress   Gait & Posture Assessment  Ambulation: Patient ambulates using a walker Gait: Relatively normal for age and body habitus Posture: WNL   Lower Extremity Exam    Side: Right lower extremity  Side: Left lower extremity  Stability: No instability observed          Stability: No instability observed          Skin & Extremity  Inspection: Skin color, temperature, and hair growth are WNL. No peripheral edema or cyanosis. No masses, redness, swelling, asymmetry, or associated skin lesions. No contractures.  Skin & Extremity Inspection: Skin color, temperature, and hair growth are WNL. No peripheral edema or cyanosis. No masses, redness, swelling, asymmetry, or associated skin lesions. No contractures.  Functional ROM: Decreased ROM                  Functional ROM: Unrestricted ROM                  Muscle Tone/Strength: Functionally intact. No obvious neuro-muscular anomalies detected.  Muscle Tone/Strength: Functionally intact. No obvious neuro-muscular anomalies detected.  Sensory (Neurological): Unimpaired  Sensory (Neurological): Unimpaired  Palpation: Tender  Palpation: No palpable anomalies   Assessment  Primary Diagnosis & Pertinent Problem List: The primary encounter diagnosis was Primary osteoarthritis of right knee. Diagnoses of Lumbar degenerative disc disease, Lumbar radiculopathy, Chronic pain of right knee, and Chronic pain syndrome were also pertinent to this visit.  Status Diagnosis  Controlled Controlled Controlled 1. Primary osteoarthritis of right knee   2. Lumbar degenerative disc disease   3. Lumbar radiculopathy   4. Chronic pain of right knee   5. Chronic pain syndrome     Problems updated and reviewed during this visit: Problem  Chronic Pain Syndrome  Lumbar Radiculopathy  Lumbar Degenerative Disc Disease  Foraminal Stenosis of Lumbar Region  Right Knee Pain  Staphylococcus Aureus Bacteremia  Vitamin D Deficiency  Type 1 Diabetes Mellitus With Diabetic Neuropathy ([facility])  Primary Open Angle Glaucoma of Both Eyes, Moderate Stage  Primary Osteoarthritis of Right Knee  Prostate cancer ([place])       Hypertension associated with diabetes ([place])        Plan of Care  Pharmacotherapy (Medications Ordered): Meds ordered this encounter  Medications  . DISCONTD: HYDROcodone-acetaminophen  (NORCO) 7.5-325 MG tablet    Sig: Take 1 tablet by mouth 3 (three) times daily as needed for moderate pain. For chronic pain To last for 30 days from fill date To fill on or after: 06/02/18    Dispense:  90 tablet    Refill:  0    Do not place this medication, or any other prescription from our practice, on "Automatic Refill". Patient may have prescription filled one day early if pharmacy is closed on scheduled refill date.    Order Specific Question:   Supervising Provider    Answer:   Milinda Pointer [phone]  . DISCONTD: HYDROcodone-acetaminophen (NORCO) 7.5-325 MG tablet    Sig: Take 1 tablet by mouth 3 (three) times daily as needed for moderate pain. For chronic pain To last for 30 days from fill date To fill on or after:07/02/2018    Dispense:  90 tablet    Refill:  0    Do not place this medication, or any other prescription from our practice, on "Automatic Refill". Patient may have prescription filled one day early if pharmacy is closed on scheduled refill date.    Order Specific Question:   Supervising Provider    Answer:   Milinda Pointer [phone]  . HYDROcodone-acetaminophen (NORCO) 7.5-325 MG tablet    Sig: Take 1 tablet by mouth 3 (three) times daily as needed for moderate pain. For chronic pain To last for 30 days from fill date To fill on or after:08/01/2018    Dispense:  90 tablet    Refill:  0    Do not place this medication, or any other prescription from our practice, on "Automatic Refill". Patient may have prescription filled one day early if pharmacy is closed on scheduled refill date.    Order Specific Question:   Supervising Provider    Answer:   Milinda Pointer [[id number]]   New Prescriptions   No medications on file   Medications administered today: Ethelda Chick had no medications administered during this visit. Lab-work, procedure(s), and/or referral(s): Orders Placed This Encounter  Procedures  . GENICULAR NERVE BLOCK   Imaging and/or  referral(s): None  Interventional therapies: Planned, scheduled, and/or pending:   Right Genicular NB     Provider-requested follow-up: No follow-ups on file.  No future appointments. Primary Care Physician: Tracie Harrier, MD Location: [facility] Note by: Vevelyn Francois NP Date: 06/02/2018; Time: 4:00 PM  Pain Score Disclaimer: We use the NRS-11 scale. This is a self-reported, subjective measurement of pain severity with only modest accuracy. It is used primarily to identify changes within a particular patient. It must be understood that outpatient pain scales are significantly less accurate that those used for research, where they can be applied under ideal controlled circumstances with minimal exposure to variables. In reality, the score is likely to be a combination of pain intensity and pain affect, where pain affect describes the degree of emotional arousal or changes in action readiness caused by the sensory experience of pain. Factors such as social and work situation, setting, emotional state, anxiety levels, expectation, and prior pain experience may influence pain perception and show large inter-individual differences that may also be affected by time variables.  Patient instructions provided during this appointment: Patient Instructions  ____________________________________________________________________________________________  Genicular Nerve Block  What is a genicular nerve block? A genicular nerve block is the injection of a local anesthetic to block the nerves that transmits pain from the knee.  What is the purpose of a facet nerve block? A genicular nerve block is a diagnostic procedure to determine if the pathologic changes (i.e. arthritis, meniscal tears, etc) and inflammation within the knee joint is the source of your knee pain. It also confirms that the knee pain will  respond well to the actual treatment procedure. If a genicular  nerve block works, it will give you relief for several hours. After that, the pain is expected to return to normal. This test is always performed twice (usually a week or two apart) because two successful tests are required to move onto treatment. If both diagnostic tests are positive, then we schedule a treatment called radiofrequency (RF) ablation. In this procedure, the same nerves are cauterized, which typically leads to pain relief for 4 -18 months. If this process works well for one knee, it can be performed on the other knee if needed.  How is the procedure performed? You will be placed on the procedure table. The injection site is sterilized with either iodine or chlorhexadine. The site to be injected is numbed with a local anesthetic, and a needle is directed to the target area. X-ray guidance is used to ensure proper placement and positioning of the needle. When the needle is properly positioned near the genicular nerve, local anesthetic is injected to numb that nerve. This will be repeated at multiple sites around the knee to block all genicular nerves.  Will the procedure be painful? The injection can be painful and we therefore provide the option of receiving IV sedation. IV sedation, combined with local anesthetic, can make the injection nearly pain free. It allows you to remain very still during the procedure, which can also make the injection easier, faster, and more successful. If you decide to have IV sedation, you must have a driver to get you home safely afterwards. In addition, you cannot have anything to eat or drink within 8 hours of your appointment (clear liquids are allowed until 3 hours before the procedure). If you take medications for diabetes, these medications may need to be adjusted the morning of the procedure. Your primary care physician can help you with this adjustment.  What are the discharge instructions? If you received IV sedation do not drive or operate machinery for  at least 24 hours after the procedure. You may return to work the next day following your procedure. You may resume your normal diet immediately. Do not engage in any strenuous activity for 24 hours. You should, however, engage in moderate activity that typically causes your ususal pain. If the block works, those activities should not be painful for several hours after the injection. Do not take a bath, swim, or use a hot tub for 24 hours (you may take a shower). Call the office if you have any of the following: severe pain afterwards (different than your usual symptoms), redness/swelling/discharge at the injection site(s), fevers/chills, difficulty with bowel or bladder functions.  What are the risks and side effects? The complication rate for this procedure is very low. Whenever a needle enters the skin, bleeding or infection can occur. Some other serious but extremely rare risks include paralysis and death. You may have an allergic reaction to any of the medications used. If you have a known allergy to any medications, especially local anesthetics, notify our staff before the procedure takes place. You may experience any of the following side effects up to 4 - 6 hours after the procedure: . Leg muscle weakness or numbness may occur due to the local anesthetic affecting the nerves that control your legs (this is a temporary affect and it is not paralysis). If you have any leg weakness or numbness, walk only with assistance in order to prevent falls and injury. Your leg strength will return slowly and completely. Marland Kitchen  Dizziness may occur due to a decrease in your blood pressure. If this occurs, remain in a seated or lying position. Gradually sit up, and then stand after at least 10 minutes of sitting. . Mild headaches may occur. Drink fluids and take pain medications if needed. If the headaches persist or become severe, call the office. . Mild discomfort at the injection site can occur. This typically lasts  for a few hours but can persist for a couple days. If this occurs, take anti-inflammatories or pain medications, apply ice to the area the day of the procedure. If it persists, apply moist heat in the day(s) following.  The side effects listed above can be normal. They are not dangerous and will resolve on their own. If, however, you experience any of the following, a complication may have occurred and you should either contact your doctor. If he is not readily available, then you should proceed to the closest urgent care center for evaluation: . Severe or progressive pain at the injection site(s) . Arm or leg weakness that progressively worsens or persists for longer than 8 hours . Severe or progressive redness, swelling, or discharge from the injections site(s) . Fevers, chills, nausea, or vomiting . Bowel or bladder dysfunction (i.e. inability to urinate or pass stool or difficulty controlling either)  How long does it take for the procedure to work? You should feel relief from your usual pain within the first hour. Again, this is only expected to last for several hours, at the most. Remember, you may be sore in the middle part of your back from the needles, and you must distinguish this from your usual pain. ____________________________________________________________________________________________  ____________________________________________________________________________________________  Preparing for Procedure with Sedation  Instructions: . Oral Intake: Do not eat or drink anything for at least 8 hours prior to your procedure. . Transportation: Public transportation is not allowed. Bring an adult driver. The driver must be physically present in our waiting room before any procedure can be started. Marland Kitchen Physical Assistance: Bring an adult physically capable of assisting you, in the event you need help. This adult should keep you company at home for at least 6 hours after the  procedure. . Blood Pressure Medicine: Take your blood pressure medicine with a sip of water the morning of the procedure. . Blood thinners:  . Diabetics on insulin: Notify the staff so that you can be scheduled 1st case in the morning. If your diabetes requires high dose insulin, take only  of your normal insulin dose the morning of the procedure and notify the staff that you have done so. . Preventing infections: Shower with an antibacterial soap the morning of your procedure. . Build-up your immune system: Take 1000 mg of Vitamin C with every meal (3 times a day) the day prior to your procedure. Marland Kitchen Antibiotics: Inform the staff if you have a condition or reason that requires you to take antibiotics before dental procedures. . Pregnancy: If you are pregnant, call and cancel the procedure. . Sickness: If you have a cold, fever, or any active infections, call and cancel the procedure. . Arrival: You must be in the facility at least 30 minutes prior to your scheduled procedure. . Children: Do not bring children with you. . Dress appropriately: Bring dark clothing that you would not mind if they get stained. . Valuables: Do not bring any jewelry or valuables.  Procedure appointments are reserved for interventional treatments only. Marland Kitchen No Prescription Refills. . No medication changes will be discussed during  procedure appointments. . No disability issues will be discussed.  Remember:  Regular Business hours are:  Monday to Thursday 8:00 AM to 4:00 PM  Provider's Schedule: Milinda Pointer, MD:  Procedure days: Tuesday and Thursday 7:30 AM to 4:00 PM  Gillis Santa, MD:  Procedure days: Monday and Wednesday 7:30 AM to 4:00 PM ____________________________________________________________________________________________

## 2018-06-02 NOTE — Patient Instructions (Signed)
____________________________________________________________________________________________  Genicular Nerve Block  What is a genicular nerve block? A genicular nerve block is the injection of a local anesthetic to block the nerves that transmits pain from the knee.  What is the purpose of a facet nerve block? A genicular nerve block is a diagnostic procedure to determine if the pathologic changes (i.e. arthritis, meniscal tears, etc) and inflammation within the knee joint is the source of your knee pain. It also confirms that the knee pain will respond well to the actual treatment procedure. If a genicular nerve block works, it will give you relief for several hours. After that, the pain is expected to return to normal. This test is always performed twice (usually a week or two apart) because two successful tests are required to move onto treatment. If both diagnostic tests are positive, then we schedule a treatment called radiofrequency (RF) ablation. In this procedure, the same nerves are cauterized, which typically leads to pain relief for 4 -18 months. If this process works well for one knee, it can be performed on the other knee if needed.  How is the procedure performed? You will be placed on the procedure table. The injection site is sterilized with either iodine or chlorhexadine. The site to be injected is numbed with a local anesthetic, and a needle is directed to the target area. X-ray guidance is used to ensure proper placement and positioning of the needle. When the needle is properly positioned near the genicular nerve, local anesthetic is injected to numb that nerve. This will be repeated at multiple sites around the knee to block all genicular nerves.  Will the procedure be painful? The injection can be painful and we therefore provide the option of receiving IV sedation. IV sedation, combined with local anesthetic, can make the injection nearly pain free. It allows you to remain very  still during the procedure, which can also make the injection easier, faster, and more successful. If you decide to have IV sedation, you must have a driver to get you home safely afterwards. In addition, you cannot have anything to eat or drink within 8 hours of your appointment (clear liquids are allowed until 3 hours before the procedure). If you take medications for diabetes, these medications may need to be adjusted the morning of the procedure. Your primary care physician can help you with this adjustment.  What are the discharge instructions? If you received IV sedation do not drive or operate machinery for at least 24 hours after the procedure. You may return to work the next day following your procedure. You may resume your normal diet immediately. Do not engage in any strenuous activity for 24 hours. You should, however, engage in moderate activity that typically causes your ususal pain. If the block works, those activities should not be painful for several hours after the injection. Do not take a bath, swim, or use a hot tub for 24 hours (you may take a shower). Call the office if you have any of the following: severe pain afterwards (different than your usual symptoms), redness/swelling/discharge at the injection site(s), fevers/chills, difficulty with bowel or bladder functions.  What are the risks and side effects? The complication rate for this procedure is very low. Whenever a needle enters the skin, bleeding or infection can occur. Some other serious but extremely rare risks include paralysis and death. You may have an allergic reaction to any of the medications used. If you have a known allergy to any medications, especially local anesthetics, notify  our staff before the procedure takes place. You may experience any of the following side effects up to 4 - 6 hours after the procedure: . Leg muscle weakness or numbness may occur due to the local anesthetic affecting the nerves that control  your legs (this is a temporary affect and it is not paralysis). If you have any leg weakness or numbness, walk only with assistance in order to prevent falls and injury. Your leg strength will return slowly and completely. . Dizziness may occur due to a decrease in your blood pressure. If this occurs, remain in a seated or lying position. Gradually sit up, and then stand after at least 10 minutes of sitting. . Mild headaches may occur. Drink fluids and take pain medications if needed. If the headaches persist or become severe, call the office. . Mild discomfort at the injection site can occur. This typically lasts for a few hours but can persist for a couple days. If this occurs, take anti-inflammatories or pain medications, apply ice to the area the day of the procedure. If it persists, apply moist heat in the day(s) following.  The side effects listed above can be normal. They are not dangerous and will resolve on their own. If, however, you experience any of the following, a complication may have occurred and you should either contact your doctor. If he is not readily available, then you should proceed to the closest urgent care center for evaluation: . Severe or progressive pain at the injection site(s) . Arm or leg weakness that progressively worsens or persists for longer than 8 hours . Severe or progressive redness, swelling, or discharge from the injections site(s) . Fevers, chills, nausea, or vomiting . Bowel or bladder dysfunction (i.e. inability to urinate or pass stool or difficulty controlling either)  How long does it take for the procedure to work? You should feel relief from your usual pain within the first hour. Again, this is only expected to last for several hours, at the most. Remember, you may be sore in the middle part of your back from the needles, and you must distinguish this from your usual  pain. ____________________________________________________________________________________________  ____________________________________________________________________________________________  Preparing for Procedure with Sedation  Instructions: . Oral Intake: Do not eat or drink anything for at least 8 hours prior to your procedure. . Transportation: Public transportation is not allowed. Bring an adult driver. The driver must be physically present in our waiting room before any procedure can be started. Marland Kitchen Physical Assistance: Bring an adult physically capable of assisting you, in the event you need help. This adult should keep you company at home for at least 6 hours after the procedure. . Blood Pressure Medicine: Take your blood pressure medicine with a sip of water the morning of the procedure. . Blood thinners:  . Diabetics on insulin: Notify the staff so that you can be scheduled 1st case in the morning. If your diabetes requires high dose insulin, take only  of your normal insulin dose the morning of the procedure and notify the staff that you have done so. . Preventing infections: Shower with an antibacterial soap the morning of your procedure. . Build-up your immune system: Take 1000 mg of Vitamin C with every meal (3 times a day) the day prior to your procedure. Marland Kitchen Antibiotics: Inform the staff if you have a condition or reason that requires you to take antibiotics before dental procedures. . Pregnancy: If you are pregnant, call and cancel the procedure. . Sickness: If you  have a cold, fever, or any active infections, call and cancel the procedure. . Arrival: You must be in the facility at least 30 minutes prior to your scheduled procedure. . Children: Do not bring children with you. . Dress appropriately: Bring dark clothing that you would not mind if they get stained. . Valuables: Do not bring any jewelry or valuables.  Procedure appointments are reserved for interventional  treatments only. Marland Kitchen No Prescription Refills. . No medication changes will be discussed during procedure appointments. . No disability issues will be discussed.  Remember:  Regular Business hours are:  Monday to Thursday 8:00 AM to 4:00 PM  Provider's Schedule: Milinda Pointer, MD:  Procedure days: Tuesday and Thursday 7:30 AM to 4:00 PM  Gillis Santa, MD:  Procedure days: Monday and Wednesday 7:30 AM to 4:00 PM ____________________________________________________________________________________________

## 2018-06-09 IMAGING — CR DG CHEST 2V
2 series · 2 of 2 positions shown · non-contrast
Comparison: 12/21/2015

CLINICAL DATA: Cough, body aches, malaise X 3 days. Non smoker.
diabetic

EXAM:
CHEST  2 VIEW

[chest pa]
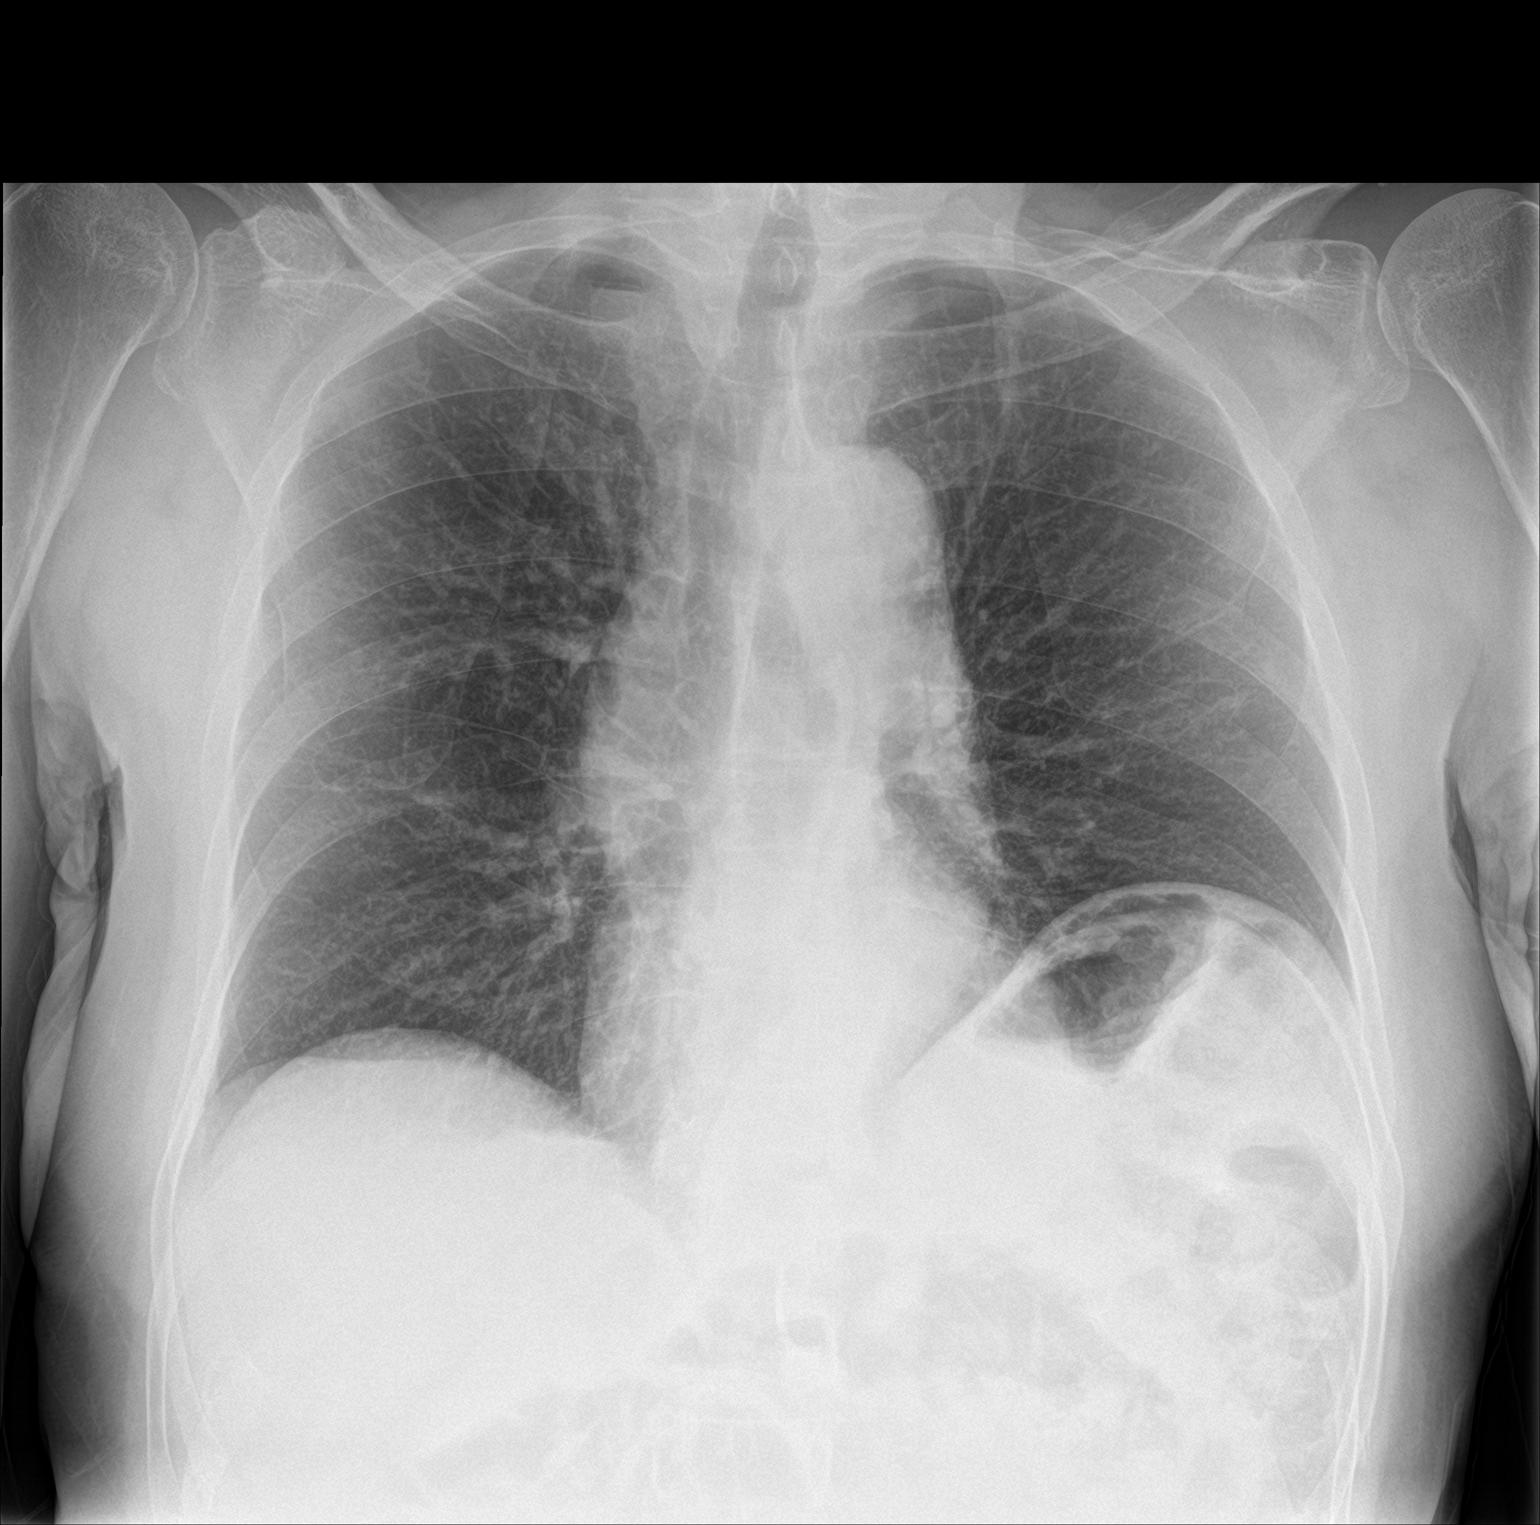

[chest lat]
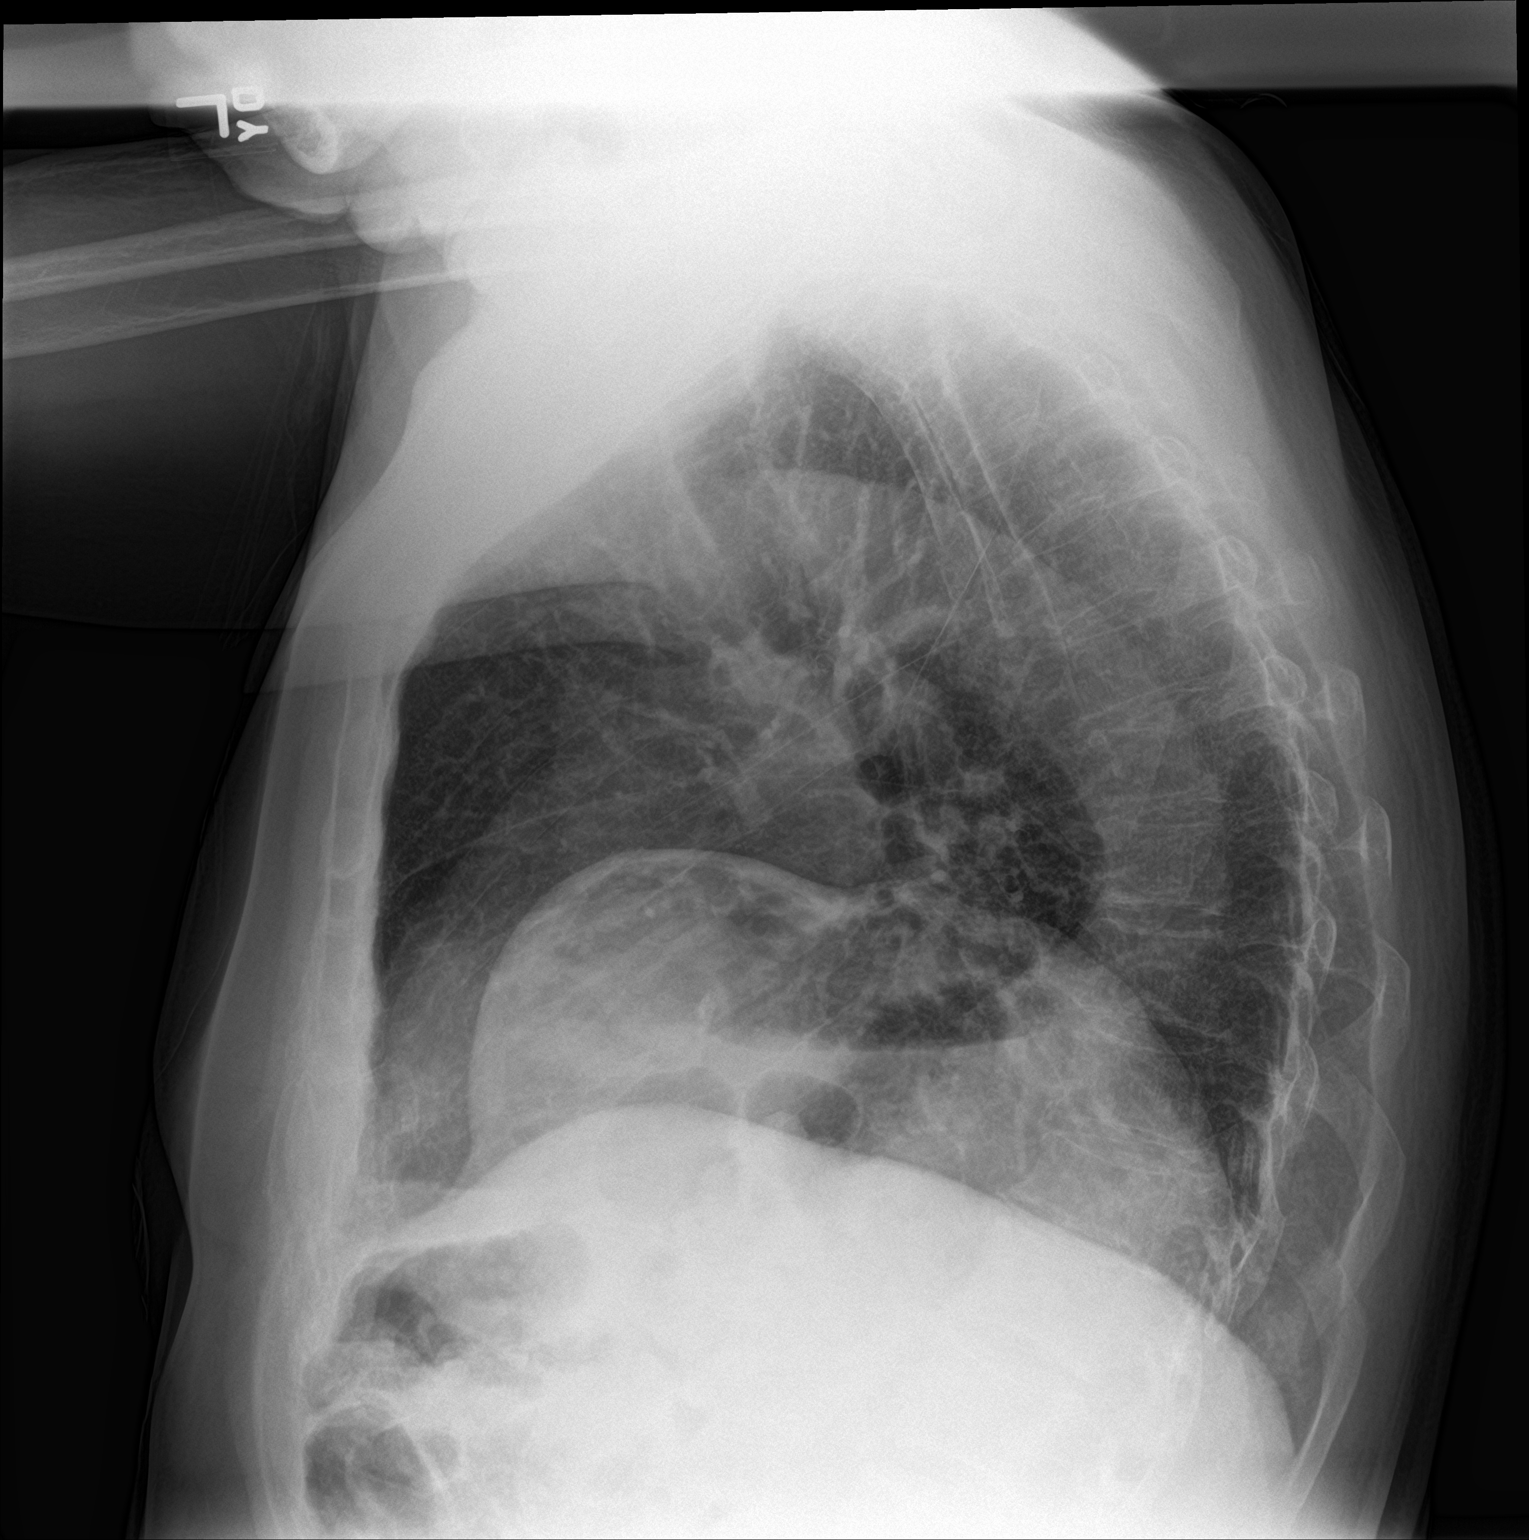

[2 of 2 positions shown; findings below may reference images not displayed]

FINDINGS: Heart size is normal. Aorta is mildly tortuous. There is stable
elevation of left hemidiaphragm. Mildly prominent interstitial
markings are stable. There are no focal consolidations or pleural
effusions. No pulmonary edema.
IMPRESSION: No evidence for acute cardiopulmonary abnormality.

## 2018-06-10 DIAGNOSIS — Z794 Long term (current) use of insulin: Secondary | ICD-10-CM | POA: Diagnosis not present

## 2018-06-10 DIAGNOSIS — E1165 Type 2 diabetes mellitus with hyperglycemia: Secondary | ICD-10-CM | POA: Diagnosis not present

## 2018-06-30 ENCOUNTER — Ambulatory Visit: Payer: Medicare Other | Admitting: Student in an Organized Health Care Education/Training Program

## 2018-07-02 DIAGNOSIS — E1159 Type 2 diabetes mellitus with other circulatory complications: Secondary | ICD-10-CM | POA: Diagnosis not present

## 2018-07-02 DIAGNOSIS — E559 Vitamin D deficiency, unspecified: Secondary | ICD-10-CM | POA: Diagnosis not present

## 2018-07-02 DIAGNOSIS — E1040 Type 1 diabetes mellitus with diabetic neuropathy, unspecified: Secondary | ICD-10-CM | POA: Diagnosis not present

## 2018-07-02 DIAGNOSIS — I10 Essential (primary) hypertension: Secondary | ICD-10-CM | POA: Diagnosis not present

## 2018-07-11 DIAGNOSIS — E1165 Type 2 diabetes mellitus with hyperglycemia: Secondary | ICD-10-CM | POA: Diagnosis not present

## 2018-07-11 DIAGNOSIS — Z794 Long term (current) use of insulin: Secondary | ICD-10-CM | POA: Diagnosis not present

## 2018-07-29 DIAGNOSIS — G4733 Obstructive sleep apnea (adult) (pediatric): Secondary | ICD-10-CM | POA: Diagnosis not present

## 2018-07-31 DIAGNOSIS — B351 Tinea unguium: Secondary | ICD-10-CM | POA: Diagnosis not present

## 2018-07-31 DIAGNOSIS — Z794 Long term (current) use of insulin: Secondary | ICD-10-CM | POA: Diagnosis not present

## 2018-07-31 DIAGNOSIS — E1140 Type 2 diabetes mellitus with diabetic neuropathy, unspecified: Secondary | ICD-10-CM | POA: Diagnosis not present

## 2018-08-12 DIAGNOSIS — E1165 Type 2 diabetes mellitus with hyperglycemia: Secondary | ICD-10-CM | POA: Diagnosis not present

## 2018-08-12 DIAGNOSIS — Z794 Long term (current) use of insulin: Secondary | ICD-10-CM | POA: Diagnosis not present

## 2018-08-18 ENCOUNTER — Ambulatory Visit
Admission: RE | Admit: 2018-08-18 | Discharge: 2018-08-18 | Disposition: A | Discharge status: Home / Self Care | Payer: Medicare Other | Source: Ambulatory Visit | Attending: Student in an Organized Health Care Education/Training Program | Admitting: Student in an Organized Health Care Education/Training Program

## 2018-08-18 ENCOUNTER — Encounter: Payer: Self-pay | Admitting: Student in an Organized Health Care Education/Training Program

## 2018-08-18 ENCOUNTER — Ambulatory Visit (HOSPITAL_BASED_OUTPATIENT_CLINIC_OR_DEPARTMENT_OTHER): Payer: Medicare Other | Admitting: Student in an Organized Health Care Education/Training Program

## 2018-08-18 VITALS — BP 155/92 | HR 76 | Temp 98.4°F | Resp 16 | Ht 73.5 in | Wt 183.0 lb

## 2018-08-18 DIAGNOSIS — M1711 Unilateral primary osteoarthritis, right knee: Secondary | ICD-10-CM

## 2018-08-18 MED ORDER — LIDOCAINE HCL 2 % IJ SOLN
INTRAMUSCULAR | Status: AC
  Filled 2018-08-18: qty 20

## 2018-08-18 MED ORDER — DEXAMETHASONE SODIUM PHOSPHATE 10 MG/ML IJ SOLN
10.0000 mg | Freq: Once | INTRAMUSCULAR | Status: AC
  Administered 2018-08-18: 10 mg

## 2018-08-18 MED ORDER — ROPIVACAINE HCL 2 MG/ML IJ SOLN
INTRAMUSCULAR | Status: AC
  Filled 2018-08-18: qty 10

## 2018-08-18 MED ORDER — DEXAMETHASONE SODIUM PHOSPHATE 10 MG/ML IJ SOLN
INTRAMUSCULAR | Status: AC
  Filled 2018-08-18: qty 1

## 2018-08-18 MED ORDER — LIDOCAINE HCL 2 % IJ SOLN
20.0000 mL | Freq: Once | INTRAMUSCULAR | Status: AC
  Administered 2018-08-18: 400 mg

## 2018-08-18 MED ORDER — ROPIVACAINE HCL 2 MG/ML IJ SOLN
10.0000 mL | Freq: Once | INTRAMUSCULAR | Status: AC
  Administered 2018-08-18: 10 mL

## 2018-08-18 NOTE — Progress Notes (Signed)
Patient's Name: Dylan Quinn  MRN: [id number]  Referring Provider: Tracie Harrier, MD  DOB: 04/21/1936  PCP: Tracie Harrier, MD  DOS: 08/18/2018  Note by: Gillis Santa, MD  Service setting: Ambulatory outpatient  Specialty: Interventional Pain Management  Patient type: Established  Location: [facility] (AMB) Pain Management Facility  Visit type: Interventional Procedure   Primary Reason for Visit: Interventional Pain Management Treatment. CC: Knee Pain (right)  Procedure:          Anesthesia, Analgesia, Anxiolysis:  Type: Genicular Nerves Block (Superior-lateral, Superior-medial, and Inferior-medial Genicular Nerves)          CPT: 63335      Primary Purpose: Therapeutic Region: Lateral, Anterior, and Medial aspects of the knee joint, above and below the knee joint proper. Level: Superior and inferior to the knee joint. Target Area: For Genicular Nerve block(s), the targets are: the superior-lateral genicular nerve, located in the lateral distal portion of the femoral shaft as it curves to form the lateral epicondyle, in the region of the distal femoral metaphysis; the superior-medial genicular nerve, located in the medial distal portion of the femoral shaft as it curves to form the medial epicondyle; and the inferior-medial genicular nerve, located in the medial, proximal portion of the tibial shaft, as it curves to form the medial epicondyle, in the region of the proximal tibial metaphysis. Approach: Anterior, percutaneous, ipsilateral approach. Laterality: Right knee Position: Modified Fowler's position with pillows under the targeted knee(s).  Type: Local Anesthesia Indication(s): Analgesia         Route: Infiltration ([place]/IM) IV Access: Declined Sedation: Declined  Local Anesthetic: Lidocaine 1-2%   Indications: 1. Primary osteoarthritis of right knee    Pain Score: Pre-procedure: 3 /10 Post-procedure: 3 /10  Pre-op Assessment:  Mr. Fecher is a 81 y.o. (year old), male patient,  seen today for interventional treatment. He  has a past surgical history that includes Cataract extraction (2006); Cataract extraction (1997); Appendectomy (1986); and TEE without cardioversion (N/A, 12/19/2016). Mr. Fluegel has a current medication list which includes the following prescription(s): brimonidine-timolol, clobetasol cream, colchicine, cyanocobalamin, docusate sodium, dorzolamide, glucagon, hydrocodone-acetaminophen, insulin glargine, insulin lispro, latanoprost, olmesartan, potassium chloride sa, pregabalin, hydrochlorothiazide, and levocetirizine. His primarily concern today is the Knee Pain (right)  Initial Vital Signs:  Pulse/HCG Rate: 76ECG Heart Rate: 67 Temp: 98.4 F (36.9 C) Resp: 16 BP: (!) 152/91 SpO2: 100 %  BMI: Estimated body mass index is 23.82 kg/m as calculated from the following:   Height as of this encounter: 6' 1.5" (1.867 m).   Weight as of this encounter: 183 lb (83 kg).  Risk Assessment: Allergies: Reviewed. He is allergic to lisinopril.  Allergy Precautions: None required Coagulopathies: Reviewed. None identified.  Blood-thinner therapy: None at this time Active Infection(s): Reviewed. None identified. Mr. Kilgallon is afebrile  Site Confirmation: Mr. Gittins was asked to confirm the procedure and laterality before marking the site Procedure checklist: Completed Consent: Before the procedure and under the influence of no sedative(s), amnesic(s), or anxiolytics, the patient was informed of the treatment options, risks and possible complications. To fulfill our ethical and legal obligations, as recommended by the American Medical Association's Code of Ethics, I have informed the patient of my clinical impression; the nature and purpose of the treatment or procedure; the risks, benefits, and possible complications of the intervention; the alternatives, including doing nothing; the risk(s) and benefit(s) of the alternative treatment(s) or procedure(s); and the  risk(s) and benefit(s) of doing nothing. The patient was provided information about the  general risks and possible complications associated with the procedure. These may include, but are not limited to: failure to achieve desired goals, infection, bleeding, organ or nerve damage, allergic reactions, paralysis, and death. In addition, the patient was informed of those risks and complications associated to the procedure, such as failure to decrease pain; infection; bleeding; organ or nerve damage with subsequent damage to sensory, motor, and/or autonomic systems, resulting in permanent pain, numbness, and/or weakness of one or several areas of the body; allergic reactions; (i.e.: anaphylactic reaction); and/or death. Furthermore, the patient was informed of those risks and complications associated with the medications. These include, but are not limited to: allergic reactions (i.e.: anaphylactic or anaphylactoid reaction(s)); adrenal axis suppression; blood sugar elevation that in diabetics may result in ketoacidosis or comma; water retention that in patients with history of congestive heart failure may result in shortness of breath, pulmonary edema, and decompensation with resultant heart failure; weight gain; swelling or edema; medication-induced neural toxicity; particulate matter embolism and blood vessel occlusion with resultant organ, and/or nervous system infarction; and/or aseptic necrosis of one or more joints. Finally, the patient was informed that Medicine is not an exact science; therefore, there is also the possibility of unforeseen or unpredictable risks and/or possible complications that may result in a catastrophic outcome. The patient indicated having understood very clearly. We have given the patient no guarantees and we have made no promises. Enough time was given to the patient to ask questions, all of which were answered to the patient's satisfaction. Mr. Ryback has indicated that he wanted  to continue with the procedure. Attestation: I, the ordering provider, attest that I have discussed with the patient the benefits, risks, side-effects, alternatives, likelihood of achieving goals, and potential problems during recovery for the procedure that I have provided informed consent. Date  Time: 08/18/2018  9:08 AM  Pre-Procedure Preparation:  Monitoring: As per clinic protocol. Respiration, ETCO2, SpO2, BP, heart rate and rhythm monitor placed and checked for adequate function Safety Precautions: Patient was assessed for positional comfort and pressure points before starting the procedure. Time-out: I initiated and conducted the "Time-out" before starting the procedure, as per protocol. The patient was asked to participate by confirming the accuracy of the "Time Out" information. Verification of the correct person, site, and procedure were performed and confirmed by me, the nursing staff, and the patient. "Time-out" conducted as per Joint Commission's Universal Protocol (UP.01.01.01). Time: [phone]  Description of Procedure:          Area Prepped: Entire knee area, from mid-thigh to mid-shin, lateral, anterior, and medial aspects. Prepping solution: ChloraPrep (2% chlorhexidine gluconate and 70% isopropyl alcohol) Safety Precautions: Aspiration looking for blood return was conducted prior to all injections. At no point did we inject any substances, as a needle was being advanced. No attempts were made at seeking any paresthesias. Safe injection practices and needle disposal techniques used. Medications properly checked for expiration dates. SDV (single dose vial) medications used. Latex Allergy precautions taken.   Description of the Procedure: Protocol guidelines were followed. The patient was placed in position over the procedure table. The target area was identified and the area prepped in the usual manner. Skin & deeper tissues infiltrated with local anesthetic. Appropriate amount of time  allowed to pass for local anesthetics to take effect. The procedure needles were then advanced to the target area. Proper needle placement secured. Negative aspiration confirmed. Solution injected in intermittent fashion, asking for systemic symptoms every 0.5cc of injectate. The needles were  then removed and the area cleansed, making sure to leave some of the prepping solution back to take advantage of its long term bactericidal properties.  Vitals:   08/18/18 0948 08/18/18 0953 08/18/18 0958 08/18/18 1004  BP: (!) 169/88 (!) 179/96 (!) 170/101 (!) 155/92  Pulse:      Resp: 12 12 17 16   Temp:      TempSrc:      SpO2: 99% 99% 99% 99%  Weight:      Height:        Start Time: 0948 hrs. End Time: 0958 hrs. Materials:  Needle(s) Type: Regular needle Gauge: 22G Length: 3.5-in Medication(s): Please see orders for medications and dosing details. 6 cc solution made of 5 cc of 0.2% ropivacaine, 1 cc of Decadron 10 mg/cc.  2 cc injected at each level above. Imaging Guidance (Non-Spinal):          Type of Imaging Technique: Fluoroscopy Guidance (Non-Spinal) Indication(s): Assistance in needle guidance and placement for procedures requiring needle placement in or near specific anatomical locations not easily accessible without such assistance. Exposure Time: Please see nurses notes. Contrast: Before injecting any contrast, we confirmed that the patient did not have an allergy to iodine, shellfish, or radiological contrast. Once satisfactory needle placement was completed at the desired level, radiological contrast was injected. Contrast injected under live fluoroscopy. No contrast complications. See chart for type and volume of contrast used. Fluoroscopic Guidance: I was personally present during the use of fluoroscopy. "Tunnel Vision Technique" used to obtain the best possible view of the target area. Parallax error corrected before commencing the procedure. "Direction-depth-direction" technique used  to introduce the needle under continuous pulsed fluoroscopy. Once target was reached, antero-posterior, oblique, and lateral fluoroscopic projection used confirm needle placement in all planes. Images permanently stored in EMR. Interpretation: I personally interpreted the imaging intraoperatively. Adequate needle placement confirmed in multiple planes. Appropriate spread of contrast into desired area was observed. No evidence of afferent or efferent intravascular uptake. Permanent images saved into the patient's record.  Antibiotic Prophylaxis:   Anti-infectives (From admission, onward)   None     Indication(s): None identified  Post-operative Assessment:  Post-procedure Vital Signs:  Pulse/HCG Rate: 7671 Temp: 98.4 F (36.9 C) Resp: 16 BP: (!) 155/92 SpO2: 99 %  EBL: None  Complications: No immediate post-treatment complications observed by team, or reported by patient.  Note: The patient tolerated the entire procedure well. A repeat set of vitals were taken after the procedure and the patient was kept under observation following institutional policy, for this type of procedure. Post-procedural neurological assessment was performed, showing return to baseline, prior to discharge. The patient was provided with post-procedure discharge instructions, including a section on how to identify potential problems. Should any problems arise concerning this procedure, the patient was given instructions to immediately contact us, at any time, without hesitation. In any case, we plan to contact the patient by telephone for a follow-up status report regarding this interventional procedure.  Comments:  No additional relevant information.  Plan of Care   Imaging Orders     DG C-Arm 1-60 Min-No Report Procedure Orders    No procedure(s) ordered today    Medications ordered for procedure: Meds ordered this encounter  Medications  . ropivacaine (PF) 2 mg/mL (0.2%) (NAROPIN) injection 10 mL  .  lidocaine (XYLOCAINE) 2 % (with pres) injection 400 mg  . dexamethasone (DECADRON) injection 10 mg   Medications administered: We administered ropivacaine (PF) 2 mg/mL (0.2%), lidocaine, and  dexamethasone.  See the medical record for exact dosing, route, and time of administration.  New Prescriptions   No medications on file   Disposition: Discharge home  Discharge Date & Time: 08/18/2018; 1010 hrs.   Physician-requested Follow-up: Return in about 6 weeks (around 09/29/2018).  Future Appointments  Date Time Provider [place]  09/30/2018  1:30 PM Gillis Santa, MD [facility] None   Primary Care Physician: Tracie Harrier, MD Location: [facility] Out[facility] Pain Management Facility Note by: Gillis Santa, MD Date: 08/18/2018; Time: 1:07 PM  Disclaimer:  Medicine is not an exact science. The only guarantee in medicine is that nothing is guaranteed. It is important to note that the decision to proceed with this intervention was based on the information collected from the patient. The Data and conclusions were drawn from the patient's questionnaire, the interview, and the physical examination. Because the information was provided in large part by the patient, it cannot be guaranteed that it has not been purposely or unconsciously manipulated. Every effort has been made to obtain as much relevant data as possible for this evaluation. It is important to note that the conclusions that lead to this procedure are derived in large part from the available data. Always take into account that the treatment will also be dependent on availability of resources and existing treatment guidelines, considered by other Pain Management Practitioners as being common knowledge and practice, at the time of the intervention. For Medico-Legal purposes, it is also important to point out that variation in procedural techniques and pharmacological choices are the acceptable norm. The indications, contraindications,  technique, and results of the above procedure should only be interpreted and judged by a Board-Certified Interventional Pain Specialist with extensive familiarity and expertise in the same exact procedure and technique.

## 2018-08-18 NOTE — Progress Notes (Signed)
Nursing Pain Medication Assessment:  Safety precautions to be maintained throughout the outpatient stay will include: orient to surroundings, keep bed in low position, maintain call bell within reach at all times, provide assistance with transfer out of bed and ambulation.  Medication Inspection Compliance: Pill count conducted under aseptic conditions, in front of the patient. Neither the pills nor the bottle was removed from the patient's sight at any time. Once count was completed pills were immediately returned to the patient in their original bottle.  Medication: Hydrocodone/APAP Pill/Patch Count: 46 of 90 pills remain Pill/Patch Appearance: Markings consistent with prescribed medication Bottle Appearance: Standard pharmacy container. Clearly labeled. Filled Date: 04 / 15 / 2019 Last Medication intake:  Yesterday

## 2018-08-18 NOTE — Patient Instructions (Signed)

## 2018-08-19 ENCOUNTER — Telehealth: Payer: Self-pay | Admitting: *Deleted

## 2018-08-19 NOTE — Telephone Encounter (Signed)
Attempted to call for post procedure follow-up. No answer, message left.

## 2018-08-22 DIAGNOSIS — E1159 Type 2 diabetes mellitus with other circulatory complications: Secondary | ICD-10-CM | POA: Diagnosis not present

## 2018-08-22 DIAGNOSIS — E1040 Type 1 diabetes mellitus with diabetic neuropathy, unspecified: Secondary | ICD-10-CM | POA: Diagnosis not present

## 2018-08-22 DIAGNOSIS — R5381 Other malaise: Secondary | ICD-10-CM | POA: Diagnosis not present

## 2018-08-22 DIAGNOSIS — R05 Cough: Secondary | ICD-10-CM | POA: Diagnosis not present

## 2018-08-29 DIAGNOSIS — E1159 Type 2 diabetes mellitus with other circulatory complications: Secondary | ICD-10-CM | POA: Diagnosis not present

## 2018-08-29 DIAGNOSIS — E538 Deficiency of other specified B group vitamins: Secondary | ICD-10-CM | POA: Diagnosis not present

## 2018-08-29 DIAGNOSIS — E559 Vitamin D deficiency, unspecified: Secondary | ICD-10-CM | POA: Diagnosis not present

## 2018-08-29 DIAGNOSIS — E1040 Type 1 diabetes mellitus with diabetic neuropathy, unspecified: Secondary | ICD-10-CM | POA: Diagnosis not present

## 2018-09-15 DIAGNOSIS — Z794 Long term (current) use of insulin: Secondary | ICD-10-CM | POA: Diagnosis not present

## 2018-09-15 DIAGNOSIS — E1165 Type 2 diabetes mellitus with hyperglycemia: Secondary | ICD-10-CM | POA: Diagnosis not present

## 2018-09-29 ENCOUNTER — Ambulatory Visit: Payer: Medicare Other | Admitting: Student in an Organized Health Care Education/Training Program

## 2018-09-30 ENCOUNTER — Ambulatory Visit: Payer: Medicare Other | Admitting: Student in an Organized Health Care Education/Training Program

## 2018-09-30 DIAGNOSIS — E1040 Type 1 diabetes mellitus with diabetic neuropathy, unspecified: Secondary | ICD-10-CM | POA: Diagnosis not present

## 2018-09-30 DIAGNOSIS — E1159 Type 2 diabetes mellitus with other circulatory complications: Secondary | ICD-10-CM | POA: Diagnosis not present

## 2018-09-30 DIAGNOSIS — I10 Essential (primary) hypertension: Secondary | ICD-10-CM | POA: Diagnosis not present

## 2018-10-06 DIAGNOSIS — E083212 Diabetes mellitus due to underlying condition with mild nonproliferative diabetic retinopathy with macular edema, left eye: Secondary | ICD-10-CM | POA: Diagnosis not present

## 2018-10-06 DIAGNOSIS — H401133 Primary open-angle glaucoma, bilateral, severe stage: Secondary | ICD-10-CM | POA: Diagnosis not present

## 2018-10-06 DIAGNOSIS — H04123 Dry eye syndrome of bilateral lacrimal glands: Secondary | ICD-10-CM | POA: Diagnosis not present

## 2018-10-06 DIAGNOSIS — E113291 Type 2 diabetes mellitus with mild nonproliferative diabetic retinopathy without macular edema, right eye: Secondary | ICD-10-CM | POA: Diagnosis not present

## 2018-11-03 DIAGNOSIS — H35043 Retinal micro-aneurysms, unspecified, bilateral: Secondary | ICD-10-CM | POA: Diagnosis not present

## 2018-11-03 DIAGNOSIS — H3563 Retinal hemorrhage, bilateral: Secondary | ICD-10-CM | POA: Diagnosis not present

## 2018-11-03 DIAGNOSIS — H359 Unspecified retinal disorder: Secondary | ICD-10-CM | POA: Diagnosis not present

## 2018-11-03 DIAGNOSIS — E113312 Type 2 diabetes mellitus with moderate nonproliferative diabetic retinopathy with macular edema, left eye: Secondary | ICD-10-CM | POA: Diagnosis not present

## 2018-11-05 DIAGNOSIS — E1129 Type 2 diabetes mellitus with other diabetic kidney complication: Secondary | ICD-10-CM | POA: Diagnosis not present

## 2018-11-05 DIAGNOSIS — I10 Essential (primary) hypertension: Secondary | ICD-10-CM | POA: Diagnosis not present

## 2018-11-05 DIAGNOSIS — M109 Gout, unspecified: Secondary | ICD-10-CM | POA: Diagnosis not present

## 2018-11-05 DIAGNOSIS — R809 Proteinuria, unspecified: Secondary | ICD-10-CM | POA: Diagnosis not present

## 2018-12-12 DIAGNOSIS — E559 Vitamin D deficiency, unspecified: Secondary | ICD-10-CM | POA: Diagnosis not present

## 2018-12-12 DIAGNOSIS — E1159 Type 2 diabetes mellitus with other circulatory complications: Secondary | ICD-10-CM | POA: Diagnosis not present

## 2018-12-12 DIAGNOSIS — E1040 Type 1 diabetes mellitus with diabetic neuropathy, unspecified: Secondary | ICD-10-CM | POA: Diagnosis not present

## 2018-12-12 DIAGNOSIS — R350 Frequency of micturition: Secondary | ICD-10-CM | POA: Diagnosis not present

## 2018-12-18 DIAGNOSIS — E1165 Type 2 diabetes mellitus with hyperglycemia: Secondary | ICD-10-CM | POA: Diagnosis not present

## 2018-12-18 DIAGNOSIS — Z794 Long term (current) use of insulin: Secondary | ICD-10-CM | POA: Diagnosis not present

## 2018-12-19 DIAGNOSIS — E1040 Type 1 diabetes mellitus with diabetic neuropathy, unspecified: Secondary | ICD-10-CM | POA: Diagnosis not present

## 2018-12-19 DIAGNOSIS — Z0000 Encounter for general adult medical examination without abnormal findings: Secondary | ICD-10-CM | POA: Diagnosis not present

## 2018-12-19 DIAGNOSIS — R5383 Other fatigue: Secondary | ICD-10-CM | POA: Diagnosis not present

## 2018-12-19 DIAGNOSIS — R5381 Other malaise: Secondary | ICD-10-CM | POA: Diagnosis not present

## 2018-12-29 DIAGNOSIS — R6889 Other general symptoms and signs: Secondary | ICD-10-CM | POA: Diagnosis not present

## 2018-12-29 DIAGNOSIS — J40 Bronchitis, not specified as acute or chronic: Secondary | ICD-10-CM | POA: Diagnosis not present

## 2019-01-08 DIAGNOSIS — E113312 Type 2 diabetes mellitus with moderate nonproliferative diabetic retinopathy with macular edema, left eye: Secondary | ICD-10-CM | POA: Diagnosis not present

## 2019-01-08 DIAGNOSIS — I10 Essential (primary) hypertension: Secondary | ICD-10-CM | POA: Diagnosis not present

## 2019-01-08 DIAGNOSIS — E1159 Type 2 diabetes mellitus with other circulatory complications: Secondary | ICD-10-CM | POA: Diagnosis not present

## 2019-01-08 DIAGNOSIS — E113391 Type 2 diabetes mellitus with moderate nonproliferative diabetic retinopathy without macular edema, right eye: Secondary | ICD-10-CM | POA: Diagnosis not present

## 2019-01-08 DIAGNOSIS — E1040 Type 1 diabetes mellitus with diabetic neuropathy, unspecified: Secondary | ICD-10-CM | POA: Diagnosis not present

## 2019-01-13 DIAGNOSIS — G4733 Obstructive sleep apnea (adult) (pediatric): Secondary | ICD-10-CM | POA: Diagnosis not present

## 2019-02-19 DIAGNOSIS — E1040 Type 1 diabetes mellitus with diabetic neuropathy, unspecified: Secondary | ICD-10-CM | POA: Diagnosis not present

## 2019-02-19 DIAGNOSIS — I10 Essential (primary) hypertension: Secondary | ICD-10-CM | POA: Diagnosis not present

## 2019-02-19 DIAGNOSIS — E1159 Type 2 diabetes mellitus with other circulatory complications: Secondary | ICD-10-CM | POA: Diagnosis not present

## 2019-02-23 DIAGNOSIS — R5383 Other fatigue: Secondary | ICD-10-CM | POA: Diagnosis not present

## 2019-02-23 DIAGNOSIS — M48061 Spinal stenosis, lumbar region without neurogenic claudication: Secondary | ICD-10-CM | POA: Diagnosis not present

## 2019-02-23 DIAGNOSIS — M1711 Unilateral primary osteoarthritis, right knee: Secondary | ICD-10-CM | POA: Diagnosis not present

## 2019-02-23 DIAGNOSIS — E1040 Type 1 diabetes mellitus with diabetic neuropathy, unspecified: Secondary | ICD-10-CM | POA: Diagnosis not present

## 2019-02-23 DIAGNOSIS — I10 Essential (primary) hypertension: Secondary | ICD-10-CM | POA: Diagnosis not present

## 2019-02-26 DIAGNOSIS — H04123 Dry eye syndrome of bilateral lacrimal glands: Secondary | ICD-10-CM | POA: Diagnosis not present

## 2019-02-26 DIAGNOSIS — H401133 Primary open-angle glaucoma, bilateral, severe stage: Secondary | ICD-10-CM | POA: Diagnosis not present

## 2019-03-12 DIAGNOSIS — E1165 Type 2 diabetes mellitus with hyperglycemia: Secondary | ICD-10-CM | POA: Diagnosis not present

## 2019-03-12 DIAGNOSIS — Z794 Long term (current) use of insulin: Secondary | ICD-10-CM | POA: Diagnosis not present

## 2019-04-13 DIAGNOSIS — G4733 Obstructive sleep apnea (adult) (pediatric): Secondary | ICD-10-CM | POA: Diagnosis not present

## 2019-05-06 DIAGNOSIS — I129 Hypertensive chronic kidney disease with stage 1 through stage 4 chronic kidney disease, or unspecified chronic kidney disease: Secondary | ICD-10-CM | POA: Diagnosis not present

## 2019-05-06 DIAGNOSIS — N183 Chronic kidney disease, stage 3 (moderate): Secondary | ICD-10-CM | POA: Diagnosis not present

## 2019-05-06 DIAGNOSIS — R809 Proteinuria, unspecified: Secondary | ICD-10-CM | POA: Diagnosis not present

## 2019-05-06 DIAGNOSIS — E1122 Type 2 diabetes mellitus with diabetic chronic kidney disease: Secondary | ICD-10-CM | POA: Diagnosis not present

## 2019-05-13 DIAGNOSIS — E1040 Type 1 diabetes mellitus with diabetic neuropathy, unspecified: Secondary | ICD-10-CM | POA: Diagnosis not present

## 2019-05-13 DIAGNOSIS — I10 Essential (primary) hypertension: Secondary | ICD-10-CM | POA: Diagnosis not present

## 2019-05-13 DIAGNOSIS — E1159 Type 2 diabetes mellitus with other circulatory complications: Secondary | ICD-10-CM | POA: Diagnosis not present

## 2019-05-13 DIAGNOSIS — E559 Vitamin D deficiency, unspecified: Secondary | ICD-10-CM | POA: Diagnosis not present

## 2019-06-18 DIAGNOSIS — E1165 Type 2 diabetes mellitus with hyperglycemia: Secondary | ICD-10-CM | POA: Diagnosis not present

## 2019-06-18 DIAGNOSIS — Z794 Long term (current) use of insulin: Secondary | ICD-10-CM | POA: Diagnosis not present

## 2019-07-17 ENCOUNTER — Other Ambulatory Visit: Payer: Self-pay

## 2019-07-17 DIAGNOSIS — R6889 Other general symptoms and signs: Secondary | ICD-10-CM | POA: Diagnosis not present

## 2019-07-17 DIAGNOSIS — Z20822 Contact with and (suspected) exposure to covid-19: Secondary | ICD-10-CM

## 2019-07-20 LAB — NOVEL CORONAVIRUS, NAA: SARS-CoV-2, NAA: NOT DETECTED

## 2019-07-27 ENCOUNTER — Telehealth: Payer: Self-pay | Admitting: Internal Medicine

## 2019-07-27 NOTE — Telephone Encounter (Signed)
Patient called in for the results of his Covid-19 test.  Patient was told the Covid was Not Detected.

## 2019-09-16 DIAGNOSIS — R600 Localized edema: Secondary | ICD-10-CM | POA: Diagnosis not present

## 2019-09-16 DIAGNOSIS — E1040 Type 1 diabetes mellitus with diabetic neuropathy, unspecified: Secondary | ICD-10-CM | POA: Diagnosis not present

## 2019-09-16 DIAGNOSIS — M48061 Spinal stenosis, lumbar region without neurogenic claudication: Secondary | ICD-10-CM | POA: Diagnosis not present

## 2019-09-16 DIAGNOSIS — I10 Essential (primary) hypertension: Secondary | ICD-10-CM | POA: Diagnosis not present

## 2019-09-16 DIAGNOSIS — M1711 Unilateral primary osteoarthritis, right knee: Secondary | ICD-10-CM | POA: Diagnosis not present

## 2019-09-17 DIAGNOSIS — E1040 Type 1 diabetes mellitus with diabetic neuropathy, unspecified: Secondary | ICD-10-CM | POA: Diagnosis not present

## 2019-09-23 DIAGNOSIS — R5381 Other malaise: Secondary | ICD-10-CM | POA: Diagnosis not present

## 2019-09-23 DIAGNOSIS — E1040 Type 1 diabetes mellitus with diabetic neuropathy, unspecified: Secondary | ICD-10-CM | POA: Diagnosis not present

## 2019-09-23 DIAGNOSIS — Z0000 Encounter for general adult medical examination without abnormal findings: Secondary | ICD-10-CM | POA: Diagnosis not present

## 2019-09-23 DIAGNOSIS — Z794 Long term (current) use of insulin: Secondary | ICD-10-CM | POA: Diagnosis not present

## 2019-09-23 DIAGNOSIS — R5383 Other fatigue: Secondary | ICD-10-CM | POA: Diagnosis not present

## 2019-09-23 DIAGNOSIS — I10 Essential (primary) hypertension: Secondary | ICD-10-CM | POA: Diagnosis not present

## 2019-09-23 DIAGNOSIS — E1059 Type 1 diabetes mellitus with other circulatory complications: Secondary | ICD-10-CM | POA: Diagnosis not present

## 2019-10-01 DIAGNOSIS — H04123 Dry eye syndrome of bilateral lacrimal glands: Secondary | ICD-10-CM | POA: Diagnosis not present

## 2019-10-01 DIAGNOSIS — E113213 Type 2 diabetes mellitus with mild nonproliferative diabetic retinopathy with macular edema, bilateral: Secondary | ICD-10-CM | POA: Diagnosis not present

## 2019-10-01 DIAGNOSIS — H401133 Primary open-angle glaucoma, bilateral, severe stage: Secondary | ICD-10-CM | POA: Diagnosis not present

## 2019-10-01 DIAGNOSIS — Z961 Presence of intraocular lens: Secondary | ICD-10-CM | POA: Diagnosis not present

## 2019-10-20 ENCOUNTER — Other Ambulatory Visit: Payer: Self-pay

## 2019-10-20 NOTE — Patient Outreach (Signed)
[facility]) Care Management  10/20/2019  Dylan Quinn [birth year] [id number]   Medication Adherence call to [employer] [facility] [employer] Verify spoke with patient wife she explain patient is taking 1 tablet daily Olmesartan 40 mg,patient explain he has not been taking it the way he is supposes,to take it,but he has started taking it on a regular basis,patient will order in 10 days,Dylan Quinn is showing past due under [place].   [facility] Direct Dial [phone]  Fax [phone] [email or web address]

## 2019-11-02 DIAGNOSIS — E1165 Type 2 diabetes mellitus with hyperglycemia: Secondary | ICD-10-CM | POA: Diagnosis not present

## 2019-11-02 DIAGNOSIS — Z794 Long term (current) use of insulin: Secondary | ICD-10-CM | POA: Diagnosis not present

## 2019-11-09 DIAGNOSIS — I10 Essential (primary) hypertension: Secondary | ICD-10-CM | POA: Diagnosis not present

## 2019-11-09 DIAGNOSIS — E1129 Type 2 diabetes mellitus with other diabetic kidney complication: Secondary | ICD-10-CM | POA: Diagnosis not present

## 2019-11-09 DIAGNOSIS — R81 Glycosuria: Secondary | ICD-10-CM | POA: Diagnosis not present

## 2019-11-09 DIAGNOSIS — R809 Proteinuria, unspecified: Secondary | ICD-10-CM | POA: Diagnosis not present

## 2019-11-11 DIAGNOSIS — E1129 Type 2 diabetes mellitus with other diabetic kidney complication: Secondary | ICD-10-CM | POA: Diagnosis present

## 2019-11-11 DIAGNOSIS — I10 Essential (primary) hypertension: Secondary | ICD-10-CM | POA: Diagnosis present

## 2019-11-11 DIAGNOSIS — R809 Proteinuria, unspecified: Secondary | ICD-10-CM | POA: Diagnosis not present

## 2019-11-12 DIAGNOSIS — H3563 Retinal hemorrhage, bilateral: Secondary | ICD-10-CM | POA: Diagnosis not present

## 2019-11-12 DIAGNOSIS — E113312 Type 2 diabetes mellitus with moderate nonproliferative diabetic retinopathy with macular edema, left eye: Secondary | ICD-10-CM | POA: Diagnosis not present

## 2019-11-12 DIAGNOSIS — E113391 Type 2 diabetes mellitus with moderate nonproliferative diabetic retinopathy without macular edema, right eye: Secondary | ICD-10-CM | POA: Diagnosis not present

## 2020-01-20 DIAGNOSIS — E1040 Type 1 diabetes mellitus with diabetic neuropathy, unspecified: Secondary | ICD-10-CM | POA: Diagnosis not present

## 2020-03-21 LAB — HM DIABETES EYE EXAM

## 2020-04-25 DIAGNOSIS — H35043 Retinal micro-aneurysms, unspecified, bilateral: Secondary | ICD-10-CM | POA: Insufficient documentation

## 2020-04-25 DIAGNOSIS — E113391 Type 2 diabetes mellitus with moderate nonproliferative diabetic retinopathy without macular edema, right eye: Secondary | ICD-10-CM | POA: Insufficient documentation

## 2020-04-25 DIAGNOSIS — H3563 Retinal hemorrhage, bilateral: Secondary | ICD-10-CM | POA: Insufficient documentation

## 2020-04-25 DIAGNOSIS — E113312 Type 2 diabetes mellitus with moderate nonproliferative diabetic retinopathy with macular edema, left eye: Secondary | ICD-10-CM | POA: Insufficient documentation

## 2020-04-25 DIAGNOSIS — E103311 Type 1 diabetes mellitus with moderate nonproliferative diabetic retinopathy with macular edema, right eye: Secondary | ICD-10-CM | POA: Insufficient documentation

## 2020-04-25 DIAGNOSIS — H359 Unspecified retinal disorder: Secondary | ICD-10-CM | POA: Insufficient documentation

## 2020-04-26 ENCOUNTER — Other Ambulatory Visit: Payer: Self-pay

## 2020-04-26 ENCOUNTER — Encounter (INDEPENDENT_AMBULATORY_CARE_PROVIDER_SITE_OTHER): Payer: Self-pay | Admitting: Ophthalmology

## 2020-04-26 ENCOUNTER — Ambulatory Visit (INDEPENDENT_AMBULATORY_CARE_PROVIDER_SITE_OTHER): Payer: Medicare Other | Admitting: Ophthalmology

## 2020-04-26 DIAGNOSIS — E103312 Type 1 diabetes mellitus with moderate nonproliferative diabetic retinopathy with macular edema, left eye: Secondary | ICD-10-CM

## 2020-04-26 DIAGNOSIS — H359 Unspecified retinal disorder: Secondary | ICD-10-CM

## 2020-04-26 DIAGNOSIS — H35043 Retinal micro-aneurysms, unspecified, bilateral: Secondary | ICD-10-CM | POA: Diagnosis not present

## 2020-04-26 DIAGNOSIS — E113312 Type 2 diabetes mellitus with moderate nonproliferative diabetic retinopathy with macular edema, left eye: Secondary | ICD-10-CM

## 2020-04-26 DIAGNOSIS — H3563 Retinal hemorrhage, bilateral: Secondary | ICD-10-CM

## 2020-04-26 MED ORDER — BEVACIZUMAB CHEMO INJECTION 1.25MG/0.05ML SYRINGE FOR KALEIDOSCOPE
1.2500 mg | INTRAVITREAL | Status: AC | PRN
  Administered 2020-04-26: 15:00:00 1.25 mg via INTRAVITREAL

## 2020-04-26 NOTE — Progress Notes (Signed)
04/26/2020     CHIEF COMPLAINT Patient presents for Retina Follow Up   HISTORY OF PRESENT ILLNESS: Dylan Quinn is a 84 y.o. male who presents to the clinic today for:   HPI    Retina Follow Up    Patient presents with  Diabetic Retinopathy.  In left eye.  Duration of 5 months.  Since onset it is stable.          Comments    5 month follow up - OCT OU, overdue for Avastin OS Patient denies change in vision and overall has no complaints. LBS 193 @ 12:53 A1C "9 or 10"  "Follow-up since November 2020 whenever ongoing therapy for diabetic CSME was planned for follow-up in December and then  January 2021       Last edited by Hurman Horn, MD on 04/26/2020  3:03 PM. (History)      Referring physician: Tracie Harrier, MD [address] [place],  [place] [address]  HISTORICAL INFORMATION:   Selected notes from the MEDICAL RECORD NUMBER    Lab Results  Component Value Date   HGBA1C 10.4 10/09/2016     CURRENT MEDICATIONS: Current Outpatient Medications (Ophthalmic Drugs)  Medication Sig  . brimonidine-timolol (COMBIGAN) 0.2-0.5 % ophthalmic solution Place 1 drop into both eyes 2 (two) times daily.   . dorzolamide (TRUSOPT) 2 % ophthalmic solution Place 1 drop into both eyes 2 (two) times daily.  Marland Kitchen latanoprost (XALATAN) 0.005 % ophthalmic solution Place 1 drop into both eyes at bedtime.   No current facility-administered medications for this visit. (Ophthalmic Drugs)   Current Outpatient Medications (Other)  Medication Sig  . clobetasol cream (TEMOVATE) [id number] % Apply 1 application topically 2 (two) times daily as needed (for rash).   . colchicine 0.6 MG tablet Take 2 tablets by mouth. 2 tablets at first sign of gout flare followed by 1  . Cyanocobalamin (B-12 IJ) Inject 1 mcg as directed every 30 (thirty) days.  Mariane Baumgarten Sodium (COLACE PO) Take 2 capsules by mouth as needed.  Marland Kitchen glucagon (GLUCAGON EMERGENCY) 1 MG injection Inject 1 mg  into the muscle once as needed.  . hydrochlorothiazide (MICROZIDE) 12.5 MG capsule Take 1 capsule by mouth daily.  . Insulin Glargine (LANTUS SOLOSTAR) 100 UNIT/ML Solostar Pen Inject 18 Units into the skin daily.   . insulin lispro (HUMALOG) 100 UNIT/ML injection Inject 0-10 Units into the skin 3 (three) times daily before meals. + sliding scale if BG>150  . levocetirizine (XYZAL) 5 MG tablet Take 1 tablet by mouth daily. In evening  . olmesartan (BENICAR) 40 MG tablet Take 1 tablet (40 mg total) by mouth daily.  . potassium chloride SA (K-DUR,KLOR-CON) 20 MEQ tablet Take 2 tablets (40 mEq total) by mouth daily.  . pregabalin (LYRICA) 100 MG capsule Take 1 capsule (100 mg total) by mouth 2 (two) times daily.  . pregabalin (LYRICA) 25 MG capsule Take 25 mg by mouth as directed.   No current facility-administered medications for this visit. (Other)      REVIEW OF SYSTEMS:    ALLERGIES Allergies  Allergen Reactions  . Lisinopril Hives    hives    PAST MEDICAL HISTORY Past Medical History:  Diagnosis Date  . BENIGN PROSTATIC HYPERTROPHY 07/19/2009  . COLONIC POLYPS 02/14/2010  . DEPRESSION 03/16/2008  . DIABETES MELLITUS, TYPE I 06/30/2007  . Dyslipidemia   . FOLLICULITIS 0000000  . GLAUCOMA 07/19/2009  . HEARING LOSS 02/14/2010  . HYPERTENSION 05/14/2008  .  HYPOGONADISM, MALE 12/16/2007  . Leukopenia   . LUMBAR RADICULOPATHY, LEFT 02/23/2010  . Other osteoporosis 12/16/2007  . PERIPHERAL NEUROPATHY 06/30/2007  . Prostate cancer ([place])   . PROTEINURIA, MILD 02/14/2010  . Psoriasis   . URINARY CALCULUS 12/27/2010   Past Surgical History:  Procedure Laterality Date  . APPENDECTOMY  1986  . CATARACT EXTRACTION  2006  . CATARACT EXTRACTION  1997  . TEE WITHOUT CARDIOVERSION N/A 12/19/2016   Procedure: TRANSESOPHAGEAL ECHOCARDIOGRAM (TEE);  Surgeon: Pixie Casino, MD;  Location[facility] ENDOSCOPY;  Service: Cardiovascular;  Laterality: N/A;    FAMILY HISTORY Family History  Problem  Relation Age of Onset  . Cancer Father        Colon Cancer  . Heart disease Father   . High blood pressure Unknown     SOCIAL HISTORY Social History   Tobacco Use  . Smoking status: Never Smoker  . Smokeless tobacco: Never Used  Substance Use Topics  . Alcohol use: No    Comment: rare  . Drug use: No         OPHTHALMIC EXAM:  Base Eye Exam    Visual Acuity (Snellen - Linear)      Right Left   Dist cc 20/20-1 20/30+1   Dist ph cc  NI   Correction: Glasses       Tonometry (Tonopen, 2:36 PM)      Right Left   Pressure 10 7       Pupils      Pupils Dark Light Shape React APD   Right PERRL 4 3 Round Slow None   Left PERRL 4 3 Round Slow None       Visual Fields (Counting fingers)      Left Right   Restrictions Partial outer superior temporal, inferior temporal, superior nasal, inferior nasal deficiencies Partial outer superior temporal, inferior temporal, superior nasal, inferior nasal deficiencies       Extraocular Movement      Right Left    Full Full       Neuro/Psych    Oriented x3: Yes   Mood/Affect: Normal       Dilation    Both eyes: 1.0% Mydriacyl, 2.5% Phenylephrine @ 2:38 PM        Slit Lamp and Fundus Exam    External Exam      Right Left   External Normal Normal       Slit Lamp Exam      Right Left   Lids/Lashes Normal Normal   Conjunctiva/Sclera White and quiet White and quiet   Cornea Clear Clear   Anterior Chamber Deep and quiet Deep and quiet   Iris Round and reactive Round and reactive   Lens Posterior chamber intraocular lens Posterior chamber intraocular lens   Vitreous Normal Normal          IMAGING AND PROCEDURES  Imaging and Procedures for 04/26/20  OCT, Retina - OU - Both Eyes       Right Eye Quality was good. Scan locations included subfoveal. Central Foveal Thickness: 309. Progression has been stable. Findings include cystoid macular edema.   Left Eye Quality was good. Scan locations included subfoveal.  Central Foveal Thickness: 422. Progression has worsened. Findings include abnormal foveal contour, cystoid macular edema.   Notes OD with micro-CME inferior to the FAZ  OS, with increased clinically significant macular edema diffusely centrally and nasal       Intravitreal Injection, Pharmacologic Agent - OS - Left Eye  Time Out 04/26/2020. 3:23 PM. Confirmed correct patient, procedure, site, and patient consented.   Anesthesia Topical anesthesia was used. Anesthetic medications included Akten 3.5%.   Procedure Preparation included Ofloxacin , 10% betadine to eyelids, 5% betadine to ocular surface. A 30 gauge needle was used.   Injection:  1.25 mg Bevacizumab (AVASTIN) SOLN   NDC: [id number], [id number]   Route: Intravitreal, Site: Left Eye, Waste: 0 mg  Post-op Post injection exam found visual acuity of at least counting fingers. The patient tolerated the procedure well. There were no complications. The patient received written and verbal post procedure care education. Post injection medications were not given.                 ASSESSMENT/PLAN:  No problem-specific Assessment & Plan notes found for this encounter.      ICD-10-CM   1. 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 Retinal hemorrhage, bilateral  H35.63   3. 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 Retinal exudates and deposits  H35.9     1.  Will resume intravitreal Avastin OS today.  Repeat examination in 5 weeks and attempt to control diabetic macular edema left eye  2.  3.  Ophthalmic Meds Ordered this visit:  Meds ordered this encounter  Medications  . Bevacizumab (AVASTIN) SOLN 1.25 mg       No follow-ups on file.  Patient Instructions  Diabetic  Retinopathy Diabetic retinopathy is a disease of the retina. The retina is a light-sensitive membrane at the back of the eye. Retinopathy is a complication of diabetes (diabetes mellitus) and a common cause of bad eyesight (visual impairment). It can eventually cause blindness. Early detection and treatment of diabetic retinopathy is important in keeping your eyes healthy and preventing further damage to them. What are the causes? Diabetic retinopathy is caused by blood sugar (glucose) levels that are too high for an extended period of time. High blood glucose over an extended period of time can:  Damage small blood vessels in the retina, allowing blood to leak through the vessel walls.  Cause new, abnormal blood vessels to grow on the retina. This can scar the retina in the advanced stage of diabetic retinopathy. What increases the risk? You are more likely to develop this condition if:  You have had diabetes for a long time.  You have poorly controlled blood glucose.  You have high blood pressure. What are the signs or symptoms? In the early stages of diabetic retinopathy, there are often no symptoms. As the condition gets worse, symptoms may include:  Blurred vision. This is usually caused by swelling due to abnormal blood glucose levels. The blurriness may go away when blood glucose levels return to normal.  Moving specks or dark spots (floaters) in your vision. These can be caused by a small amount of bleeding (hemorrhage) from retinal blood vessels.  Missing parts of your field of vision, such as vision at the sides of the eyes. This can be caused by larger retinal hemorrhages.  Difficulty reading.  Double vision.  Pain in one or both eyes.  Feeling pressure in one or both eyes.  Trouble seeing straight lines. Straight lines may not look straight.  Redness of the eyes that does not  go away. How is this diagnosed? This condition may be diagnosed with an eye exam in which  your eye care specialist puts drops in your eyes that enlarge (dilate) your pupils. This lets your health care provider examine your retina and check for changes in your retinal blood vessels. How is this treated? This condition may be treated by:  Keeping your blood glucose and blood pressure within a target range.  Using a type of laser beam to seal your retinal blood vessels. This stops them from bleeding and decreases pressure in your eye.  Getting shots of medicine in the eye to reduce swelling of the center of the retina (macula). You may be given: ? Anti-VEGF medicine. This medicine can help slow vision loss, and may even improve vision. ? Steroid medicine. Follow these instructions at home:   Follow your diabetes management plan as directed by your health care provider. This may include exercising regularly and eating a healthy diet.  Keep your blood glucose level and your blood pressure in your target range, as directed by your health care provider.  Check your blood glucose as often as directed.  Take over the counter and prescription medicines only as told by your health care provider. This includes insulin and oral diabetes medicine.  Get your eyes checked at least once every year. An eye specialist can usually see diabetic retinopathy developing long before it starts to cause problems. In many cases, it can be treated to prevent complications from occurring.  Do not use any products that contain nicotine or tobacco, such as cigarettes and e-cigarettes. If you need help quitting, ask your health care provider.  Keep all follow-up visits as told by your health care provider. This is important. Contact a health care provider if:  You notice gradual blurring or other changes in your vision over time.  You notice that your glasses or contact lenses do not make things look as sharp as they once did.  You have trouble reading or seeing details at a distance with either  eye.  You notice a change in your vision or notice that parts of your field of vision appear missing or hazy.  You suddenly see moving specks or dark spots in the field of vision of either eye. Get help right away if:  You have sudden pain or pressure in one or both eyes.  You suddenly lose vision or a curtain or veil seems to come across your eyes.  You have a sudden burst of floaters in your vision. Summary  Diabetic retinopathy is a disease of the retina. The retina is a light-sensitive membrane at the back of the eye. Retinopathy is a complication of diabetes.  Get your eyes checked at least once every year. An eye specialist can usually see diabetic retinopathy developing long before it starts to cause problems. In many cases, it can be treated to prevent complications from occurring.  Keep your blood glucose and your blood pressure in target range. Follow your diabetes management plan as directed by your health care provider.  Protect your eyes. Wear sunglasses and eye protection when needed. This information is not intended to replace advice given to you by your health care provider. Make sure you discuss any questions you have with your health care provider. Document Revised: 01/22/2018 Document Reviewed: 01/14/2017 Elsevier Patient Education  2020 [employer].     Explained the diagnoses, plan, and follow up with the patient and they expressed understanding.  Patient expressed understanding of the  importance of proper follow up care.   Clent Demark Amerigo Mcglory M.D. Diseases & Surgery of the Retina and Vitreous Retina & Diabetic [place] 04/26/20     Abbreviations: M myopia (nearsighted); A astigmatism; H hyperopia (farsighted); P presbyopia; Mrx spectacle prescription;  CTL contact lenses; OD right eye; OS left eye; OU both eyes  XT exotropia; ET esotropia; PEK punctate epithelial keratitis; PEE punctate epithelial erosions; DES dry eye syndrome; MGD meibomian gland  dysfunction; ATs artificial tears; PFAT's preservative free artificial tears; [place] nuclear sclerotic cataract; PSC posterior subcapsular cataract; ERM epi-retinal membrane; PVD posterior vitreous detachment; RD retinal detachment; DM diabetes mellitus; DR diabetic retinopathy; NPDR non-proliferative diabetic retinopathy; PDR proliferative diabetic retinopathy; CSME clinically significant macular edema; DME diabetic macular edema; dbh dot blot hemorrhages; CWS cotton wool spot; POAG primary open angle glaucoma; C/D cup-to-disc ratio; HVF humphrey visual field; GVF goldmann visual field; OCT optical coherence tomography; IOP intraocular pressure; BRVO Branch retinal vein occlusion; CRVO central retinal vein occlusion; CRAO central retinal artery occlusion; BRAO branch retinal artery occlusion; RT retinal tear; SB scleral buckle; PPV pars plana vitrectomy; VH Vitreous hemorrhage; PRP panretinal laser photocoagulation; IVK intravitreal kenalog; VMT vitreomacular traction; MH Macular hole;  NVD neovascularization of the disc; NVE neovascularization elsewhere; AREDS age related eye disease study; ARMD age related macular degeneration; POAG primary open angle glaucoma; EBMD epithelial/anterior basement membrane dystrophy; ACIOL anterior chamber intraocular lens; IOL intraocular lens; PCIOL posterior chamber intraocular lens; Phaco/IOL phacoemulsification with intraocular lens placement; [place] photorefractive keratectomy; LASIK laser assisted in situ keratomileusis; HTN hypertension; DM diabetes mellitus; COPD chronic obstructive pulmonary disease

## 2020-04-26 NOTE — Patient Instructions (Signed)
Diabetic Retinopathy Diabetic retinopathy is a disease of the retina. The retina is a light-sensitive membrane at the back of the eye. Retinopathy is a complication of diabetes (diabetes mellitus) and a common cause of bad eyesight (visual impairment). It can eventually cause blindness. Early detection and treatment of diabetic retinopathy is important in keeping your eyes healthy and preventing further damage to them. What are the causes? Diabetic retinopathy is caused by blood sugar (glucose) levels that are too high for an extended period of time. High blood glucose over an extended period of time can:  Damage small blood vessels in the retina, allowing blood to leak through the vessel walls.  Cause new, abnormal blood vessels to grow on the retina. This can scar the retina in the advanced stage of diabetic retinopathy. What increases the risk? You are more likely to develop this condition if:  You have had diabetes for a long time.  You have poorly controlled blood glucose.  You have high blood pressure. What are the signs or symptoms? In the early stages of diabetic retinopathy, there are often no symptoms. As the condition gets worse, symptoms may include:  Blurred vision. This is usually caused by swelling due to abnormal blood glucose levels. The blurriness may go away when blood glucose levels return to normal.  Moving specks or dark spots (floaters) in your vision. These can be caused by a small amount of bleeding (hemorrhage) from retinal blood vessels.  Missing parts of your field of vision, such as vision at the sides of the eyes. This can be caused by larger retinal hemorrhages.  Difficulty reading.  Double vision.  Pain in one or both eyes.  Feeling pressure in one or both eyes.  Trouble seeing straight lines. Straight lines may not look straight.  Redness of the eyes that does not go away. How is this diagnosed? This condition may be diagnosed with an eye exam in  which your eye care specialist puts drops in your eyes that enlarge (dilate) your pupils. This lets your health care provider examine your retina and check for changes in your retinal blood vessels. How is this treated? This condition may be treated by:  Keeping your blood glucose and blood pressure within a target range.  Using a type of laser beam to seal your retinal blood vessels. This stops them from bleeding and decreases pressure in your eye.  Getting shots of medicine in the eye to reduce swelling of the center of the retina (macula). You may be given: ? Anti-VEGF medicine. This medicine can help slow vision loss, and may even improve vision. ? Steroid medicine. Follow these instructions at home:   Follow your diabetes management plan as directed by your health care provider. This may include exercising regularly and eating a healthy diet.  Keep your blood glucose level and your blood pressure in your target range, as directed by your health care provider.  Check your blood glucose as often as directed.  Take over the counter and prescription medicines only as told by your health care provider. This includes insulin and oral diabetes medicine.  Get your eyes checked at least once every year. An eye specialist can usually see diabetic retinopathy developing long before it starts to cause problems. In many cases, it can be treated to prevent complications from occurring.  Do not use any products that contain nicotine or tobacco, such as cigarettes and e-cigarettes. If you need help quitting, ask your health care provider.  Keep all follow-up   visits as told by your health care provider. This is important. Contact a health care provider if:  You notice gradual blurring or other changes in your vision over time.  You notice that your glasses or contact lenses do not make things look as sharp as they once did.  You have trouble reading or seeing details at a distance with either  eye.  You notice a change in your vision or notice that parts of your field of vision appear missing or hazy.  You suddenly see moving specks or dark spots in the field of vision of either eye. Get help right away if:  You have sudden pain or pressure in one or both eyes.  You suddenly lose vision or a curtain or veil seems to come across your eyes.  You have a sudden burst of floaters in your vision. Summary  Diabetic retinopathy is a disease of the retina. The retina is a light-sensitive membrane at the back of the eye. Retinopathy is a complication of diabetes.  Get your eyes checked at least once every year. An eye specialist can usually see diabetic retinopathy developing long before it starts to cause problems. In many cases, it can be treated to prevent complications from occurring.  Keep your blood glucose and your blood pressure in target range. Follow your diabetes management plan as directed by your health care provider.  Protect your eyes. Wear sunglasses and eye protection when needed. This information is not intended to replace advice given to you by your health care provider. Make sure you discuss any questions you have with your health care provider. Document Revised: 01/22/2018 Document Reviewed: 01/14/2017 Elsevier Patient Education  2020 [employer]  

## 2020-06-02 ENCOUNTER — Other Ambulatory Visit: Payer: Self-pay

## 2020-06-02 ENCOUNTER — Ambulatory Visit (INDEPENDENT_AMBULATORY_CARE_PROVIDER_SITE_OTHER): Payer: Medicare Other | Admitting: Ophthalmology

## 2020-06-02 ENCOUNTER — Encounter (INDEPENDENT_AMBULATORY_CARE_PROVIDER_SITE_OTHER): Payer: Self-pay | Admitting: Ophthalmology

## 2020-06-02 DIAGNOSIS — E103312 Type 1 diabetes mellitus with moderate nonproliferative diabetic retinopathy with macular edema, left eye: Secondary | ICD-10-CM | POA: Diagnosis not present

## 2020-06-02 MED ORDER — BEVACIZUMAB CHEMO INJECTION 1.25MG/0.05ML SYRINGE FOR KALEIDOSCOPE
1.2500 mg | INTRAVITREAL | Status: AC | PRN
  Administered 2020-06-02: 1.25 mg via INTRAVITREAL

## 2020-06-02 NOTE — Progress Notes (Signed)
06/02/2020     CHIEF COMPLAINT Patient presents for Retina Follow Up   HISTORY OF PRESENT ILLNESS: Dylan Quinn is a 84 y.o. male who presents to the clinic today for:   HPI    Retina Follow Up    Patient presents with  Diabetic Retinopathy.  In left eye.  Duration of 5 weeks.  Since onset it is stable.          Comments    5 week follow up - OCT OU, Poss Avastin OS Patient denies change in vision and overall has no complaints. LBS 100 this AM A1C 10        Last edited by Gerda Diss on 06/02/2020  2:13 PM. (History)      Referring physician: Tracie Harrier, MD [address] [place],  [place] [address]  HISTORICAL INFORMATION:   Selected notes from the MEDICAL RECORD NUMBER    Lab Results  Component Value Date   HGBA1C 10.4 10/09/2016     CURRENT MEDICATIONS: Current Outpatient Medications (Ophthalmic Drugs)  Medication Sig  . brimonidine-timolol (COMBIGAN) 0.2-0.5 % ophthalmic solution Place 1 drop into both eyes 2 (two) times daily.   . dorzolamide (TRUSOPT) 2 % ophthalmic solution Place 1 drop into both eyes 2 (two) times daily.  Marland Kitchen latanoprost (XALATAN) 0.005 % ophthalmic solution Place 1 drop into both eyes at bedtime.   No current facility-administered medications for this visit. (Ophthalmic Drugs)   Current Outpatient Medications (Other)  Medication Sig  . clobetasol cream (TEMOVATE) 3.54 % Apply 1 application topically 2 (two) times daily as needed (for rash).   . colchicine 0.6 MG tablet Take 2 tablets by mouth. 2 tablets at first sign of gout flare followed by 1  . Cyanocobalamin (B-12 IJ) Inject 1 mcg as directed every 30 (thirty) days.  Mariane Baumgarten Sodium (COLACE PO) Take 2 capsules by mouth as needed.  Marland Kitchen glucagon (GLUCAGON EMERGENCY) 1 MG injection Inject 1 mg into the muscle once as needed.  . hydrochlorothiazide (MICROZIDE) 12.5 MG capsule Take 1 capsule by mouth daily.  . Insulin Glargine (LANTUS SOLOSTAR)  100 UNIT/ML Solostar Pen Inject 18 Units into the skin daily.   . insulin lispro (HUMALOG) 100 UNIT/ML injection Inject 0-10 Units into the skin 3 (three) times daily before meals. + sliding scale if BG>150  . levocetirizine (XYZAL) 5 MG tablet Take 1 tablet by mouth daily. In evening  . olmesartan (BENICAR) 40 MG tablet Take 1 tablet (40 mg total) by mouth daily.  . potassium chloride SA (K-DUR,KLOR-CON) 20 MEQ tablet Take 2 tablets (40 mEq total) by mouth daily.  . pregabalin (LYRICA) 100 MG capsule Take 1 capsule (100 mg total) by mouth 2 (two) times daily.  . pregabalin (LYRICA) 25 MG capsule Take 25 mg by mouth as directed.   No current facility-administered medications for this visit. (Other)      REVIEW OF SYSTEMS:    ALLERGIES Allergies  Allergen Reactions  . Lisinopril Hives    hives    PAST MEDICAL HISTORY Past Medical History:  Diagnosis Date  . BENIGN PROSTATIC HYPERTROPHY 07/19/2009  . COLONIC POLYPS 02/14/2010  . DEPRESSION 03/16/2008  . DIABETES MELLITUS, TYPE I 06/30/2007  . Dyslipidemia   . FOLLICULITIS 04/28/2562  . GLAUCOMA 07/19/2009  . HEARING LOSS 02/14/2010  . HYPERTENSION 05/14/2008  . HYPOGONADISM, MALE 12/16/2007  . Leukopenia   . LUMBAR RADICULOPATHY, LEFT 02/23/2010  . Other osteoporosis 12/16/2007  . PERIPHERAL NEUROPATHY 06/30/2007  .  Prostate cancer ([place])   . PROTEINURIA, MILD 02/14/2010  . Psoriasis   . URINARY CALCULUS 12/27/2010   Past Surgical History:  Procedure Laterality Date  . APPENDECTOMY  1986  . CATARACT EXTRACTION  2006  . CATARACT EXTRACTION  1997  . TEE WITHOUT CARDIOVERSION N/A 12/19/2016   Procedure: TRANSESOPHAGEAL ECHOCARDIOGRAM (TEE);  Surgeon: Pixie Casino, MD;  Location: [facility] ENDOSCOPY;  Service: Cardiovascular;  Laterality: N/A;    FAMILY HISTORY Family History  Problem Relation Age of Onset  . Cancer Father        Colon Cancer  . Heart disease Father   . High blood pressure Unknown     SOCIAL HISTORY Social  History   Tobacco Use  . Smoking status: Never Smoker  . Smokeless tobacco: Never Used  Substance Use Topics  . Alcohol use: No    Comment: rare  . Drug use: No         OPHTHALMIC EXAM:  Base Eye Exam    Visual Acuity (Snellen - Linear)      Right Left   Dist [place] 20/20-2 20/25-2       Tonometry (Tonopen, 2:18 PM)      Right Left   Pressure 10 7       Pupils      Pupils Dark Light Shape React APD   Right PERRL 4 3 Round Slow None   Left PERRL 4 3 Round Slow None       Visual Fields (Counting fingers)      Left Right   Restrictions Partial outer superior temporal, inferior temporal, superior nasal, inferior nasal deficiencies Partial outer superior temporal, inferior temporal, superior nasal, inferior nasal deficiencies       Extraocular Movement      Right Left    Full Full       Neuro/Psych    Oriented x3: Yes   Mood/Affect: Normal       Dilation    Left eye: 1.0% Mydriacyl, 2.5% Phenylephrine @ 2:18 PM        Slit Lamp and Fundus Exam    External Exam      Right Left   External Normal Normal       Slit Lamp Exam      Right Left   Lids/Lashes Normal Normal   Conjunctiva/Sclera White and quiet White and quiet   Cornea Clear Clear   Anterior Chamber Deep and quiet Deep and quiet   Iris Round and reactive Round and reactive   Lens Posterior chamber intraocular lens Posterior chamber intraocular lens   Anterior Vitreous Normal Normal       Fundus Exam      Right Left   Posterior Vitreous  Normal   Disc  Thin rim   C/D Ratio  0.8   Macula  Macular thickening, Mild clinically significant macular edema   Vessels  NPDR- Moderate   Periphery  Normal          IMAGING AND PROCEDURES  Imaging and Procedures for 06/02/20  OCT, Retina - OU - Both Eyes       Right Eye Quality was good. Scan locations included subfoveal. Central Foveal Thickness: 312. Progression has been stable.   Left Eye Quality was good. Scan locations included  subfoveal. Central Foveal Thickness: 405. Progression has improved.   Notes OS with CSME much improved 5 weeks status post intravitreal Avastin.  This is accompanied by acuity improvement as well.       Intravitreal Injection,  Pharmacologic Agent - OS - Left Eye       Time Out 06/02/2020. 3:18 PM. Confirmed correct patient, procedure, site, and patient consented.   Anesthesia Topical anesthesia was used. Anesthetic medications included Akten 3.5%.   Procedure Preparation included Tobramycin 0.3%, 10% betadine to eyelids. A 30 gauge needle was used.   Injection:  1.25 mg Bevacizumab (AVASTIN) SOLN   NDC: [address]-0, Lot: [id number]   Route: Intravitreal, Site: Left Eye, Waste: 0 mg  Post-op Post injection exam found visual acuity of at least counting fingers. The patient tolerated the procedure well. There were no complications. The patient received written and verbal post procedure care education. Post injection medications were not given.                 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  Examination repeat in 5 weeks      ICD-10-CM   1. 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  This is accompanied by acuity improvement as well.  2.  3.  Ophthalmic Meds Ordered this visit:  Meds ordered this encounter  Medications  . Bevacizumab (AVASTIN) SOLN 1.25 mg       Return in about 5 weeks (around 07/07/2020) for AVASTIN OCT, OS.  There are no  Patient Instructions on file for this visit.   Explained the diagnoses, plan, and follow up with the patient and they expressed understanding.  Patient expressed understanding of the importance of proper follow up care.   Clent Demark Carlus Stay M.D. Diseases & Surgery of the Retina and Vitreous Retina & Diabetic [place] 06/02/20     Abbreviations: M myopia (nearsighted); A astigmatism; H hyperopia (farsighted); P presbyopia; Mrx spectacle prescription;  CTL contact lenses; OD right eye; OS left eye; OU both eyes  XT exotropia; ET esotropia; PEK punctate epithelial keratitis; PEE punctate epithelial erosions; DES dry eye syndrome; MGD meibomian gland dysfunction; ATs artificial tears; PFAT's preservative free artificial tears; [place] nuclear sclerotic cataract; PSC posterior subcapsular cataract; ERM epi-retinal membrane; PVD posterior vitreous detachment; RD retinal detachment; DM diabetes mellitus; DR diabetic retinopathy; NPDR non-proliferative diabetic retinopathy; PDR proliferative diabetic retinopathy; CSME clinically significant macular edema; DME diabetic macular edema; dbh dot blot hemorrhages; CWS cotton wool spot; POAG primary open angle glaucoma; C/D cup-to-disc ratio; HVF humphrey visual field; GVF goldmann visual field; OCT optical coherence tomography; IOP intraocular pressure; BRVO Branch retinal vein occlusion; CRVO central retinal vein occlusion; CRAO central retinal artery occlusion; BRAO branch retinal artery occlusion; RT retinal tear; SB scleral buckle; PPV pars plana vitrectomy; VH Vitreous hemorrhage; PRP panretinal laser photocoagulation; IVK intravitreal kenalog; VMT vitreomacular traction; MH Macular hole;  NVD neovascularization of the disc; NVE neovascularization elsewhere; AREDS age related eye disease study; ARMD age related macular degeneration; POAG primary open angle glaucoma; EBMD epithelial/anterior basement membrane dystrophy; ACIOL anterior chamber intraocular lens; IOL  intraocular lens; PCIOL posterior chamber intraocular lens; Phaco/IOL phacoemulsification with intraocular lens placement; [place] photorefractive keratectomy; LASIK laser assisted in situ keratomileusis; HTN hypertension; DM diabetes mellitus;  COPD chronic obstructive pulmonary disease

## 2020-06-02 NOTE — Assessment & Plan Note (Signed)
OS will repeat vitreal Avastin today as much less central foveal edema, 5 weeks status post recent intravitreal Avastin.  Examination repeat in 5 weeks

## 2020-07-07 ENCOUNTER — Other Ambulatory Visit: Payer: Self-pay

## 2020-07-07 ENCOUNTER — Encounter (INDEPENDENT_AMBULATORY_CARE_PROVIDER_SITE_OTHER): Payer: Self-pay | Admitting: Ophthalmology

## 2020-07-07 ENCOUNTER — Ambulatory Visit (INDEPENDENT_AMBULATORY_CARE_PROVIDER_SITE_OTHER): Payer: Medicare Other | Admitting: Ophthalmology

## 2020-07-07 DIAGNOSIS — E103312 Type 1 diabetes mellitus with moderate nonproliferative diabetic retinopathy with macular edema, left eye: Secondary | ICD-10-CM | POA: Diagnosis not present

## 2020-07-07 MED ORDER — BEVACIZUMAB CHEMO INJECTION 1.25MG/0.05ML SYRINGE FOR KALEIDOSCOPE
1.2500 mg | INTRAVITREAL | Status: AC | PRN
  Administered 2020-07-07: 1.25 mg via INTRAVITREAL

## 2020-07-07 NOTE — Assessment & Plan Note (Signed)
Repeat intravitreal Avastin OS today to maintain current excellent vision, and prevent progression of CSME today at 5-week exam interval.  We will repeat exam in 6 weeks

## 2020-07-07 NOTE — Progress Notes (Signed)
07/07/2020     CHIEF COMPLAINT Patient presents for Retina Follow Up   HISTORY OF PRESENT ILLNESS: Dylan Quinn is a 83 y.o. male who presents to the clinic today for:   HPI    Retina Follow Up    Patient presents with  Diabetic Retinopathy.  In left eye.  Duration of 5 weeks.  Since onset it is stable.          Comments    5 week follow up - OCT OU, poss Avastin OS Patient denies change in vision and overall has no complaints.        Last edited by Gerda Diss on 07/07/2020  2:33 PM. (History)      Referring physician: Tracie Harrier, MD [address] [facility] [place],  [place] [address]  HISTORICAL INFORMATION:   Selected notes from the MEDICAL RECORD NUMBER    Lab Results  Component Value Date   HGBA1C 10.4 10/09/2016     CURRENT MEDICATIONS: Current Outpatient Medications (Ophthalmic Drugs)  Medication Sig  . brimonidine-timolol (COMBIGAN) 0.2-0.5 % ophthalmic solution Place 1 drop into both eyes 2 (two) times daily.   . dorzolamide (TRUSOPT) 2 % ophthalmic solution Place 1 drop into both eyes 2 (two) times daily.  Marland Kitchen latanoprost (XALATAN) 0.005 % ophthalmic solution Place 1 drop into both eyes at bedtime.   No current facility-administered medications for this visit. (Ophthalmic Drugs)   Current Outpatient Medications (Other)  Medication Sig  . clobetasol cream (TEMOVATE) 7.68 % Apply 1 application topically 2 (two) times daily as needed (for rash).   . colchicine 0.6 MG tablet Take 2 tablets by mouth. 2 tablets at first sign of gout flare followed by 1  . Cyanocobalamin (B-12 IJ) Inject 1 mcg as directed every 30 (thirty) days.  Mariane Baumgarten Sodium (COLACE PO) Take 2 capsules by mouth as needed.  Marland Kitchen glucagon (GLUCAGON EMERGENCY) 1 MG injection Inject 1 mg into the muscle once as needed.  . hydrochlorothiazide (MICROZIDE) 12.5 MG capsule Take 1 capsule by mouth daily.  . Insulin Glargine (LANTUS SOLOSTAR) 100 UNIT/ML Solostar Pen  Inject 18 Units into the skin daily.   . insulin lispro (HUMALOG) 100 UNIT/ML injection Inject 0-10 Units into the skin 3 (three) times daily before meals. + sliding scale if BG>150  . levocetirizine (XYZAL) 5 MG tablet Take 1 tablet by mouth daily. In evening  . olmesartan (BENICAR) 40 MG tablet Take 1 tablet (40 mg total) by mouth daily.  . potassium chloride SA (K-DUR,KLOR-CON) 20 MEQ tablet Take 2 tablets (40 mEq total) by mouth daily.  . pregabalin (LYRICA) 100 MG capsule Take 1 capsule (100 mg total) by mouth 2 (two) times daily.  . pregabalin (LYRICA) 25 MG capsule Take 25 mg by mouth as directed.   No current facility-administered medications for this visit. (Other)      REVIEW OF SYSTEMS:    ALLERGIES Allergies  Allergen Reactions  . Lisinopril Hives    hives    PAST MEDICAL HISTORY Past Medical History:  Diagnosis Date  . BENIGN PROSTATIC HYPERTROPHY 07/19/2009  . COLONIC POLYPS 02/14/2010  . DEPRESSION 03/16/2008  . DIABETES MELLITUS, TYPE I 06/30/2007  . Dyslipidemia   . FOLLICULITIS 12/24/5724  . GLAUCOMA 07/19/2009  . HEARING LOSS 02/14/2010  . HYPERTENSION 05/14/2008  . HYPOGONADISM, MALE 12/16/2007  . Leukopenia   . LUMBAR RADICULOPATHY, LEFT 02/23/2010  . Other osteoporosis 12/16/2007  . PERIPHERAL NEUROPATHY 06/30/2007  . Prostate cancer ([place])   .  PROTEINURIA, MILD 02/14/2010  . Psoriasis   . URINARY CALCULUS 12/27/2010   Past Surgical History:  Procedure Laterality Date  . APPENDECTOMY  1986  . CATARACT EXTRACTION  2006  . CATARACT EXTRACTION  1997  . TEE WITHOUT CARDIOVERSION N/A 12/19/2016   Procedure: TRANSESOPHAGEAL ECHOCARDIOGRAM (TEE);  Surgeon: Pixie Casino, MD;  Location: [facility] ENDOSCOPY;  Service: Cardiovascular;  Laterality: N/A;    FAMILY HISTORY Family History  Problem Relation Age of Onset  . Cancer Father        Colon Cancer  . Heart disease Father   . High blood pressure Unknown     SOCIAL HISTORY Social History   Tobacco Use  .  Smoking status: Never Smoker  . Smokeless tobacco: Never Used  Substance Use Topics  . Alcohol use: No    Comment: rare  . Drug use: No         OPHTHALMIC EXAM:  Base Eye Exam    Visual Acuity (Snellen - Linear)      Right Left   Dist [place] 20/20-2 20/25-2   Correction: Glasses       Tonometry (Tonopen, 2:47 PM)      Right Left   Pressure 10 7       Pupils      Pupils Dark Light Shape React APD   Right PERRL 4 3 Round Slow None   Left PERRL 4 3 Round Slow None       Visual Fields (Counting fingers)      Left Right   Restrictions Partial outer superior temporal, inferior temporal, superior nasal, inferior nasal deficiencies Partial outer superior temporal, inferior temporal, superior nasal, inferior nasal deficiencies       Extraocular Movement      Right Left    Full Full       Neuro/Psych    Oriented x3: Yes   Mood/Affect: Normal       Dilation    Left eye: 1.0% Mydriacyl, 2.5% Phenylephrine @ 2:47 PM        Slit Lamp and Fundus Exam    External Exam      Right Left   External Normal Normal       Slit Lamp Exam      Right Left   Lids/Lashes Normal Normal   Conjunctiva/Sclera White and quiet White and quiet   Cornea Clear Clear   Anterior Chamber Deep and quiet Deep and quiet   Iris Round and reactive Round and reactive   Lens Posterior chamber intraocular lens Posterior chamber intraocular lens   Anterior Vitreous Normal Normal       Fundus Exam      Right Left   Posterior Vitreous  Normal   Disc  Thin rim   C/D Ratio  0.8   Macula  Macular thickening, Mild clinically significant macular edema   Vessels  NPDR- Moderate   Periphery  Normal          IMAGING AND PROCEDURES  Imaging and Procedures for 07/07/20  OCT, Retina - OU - Both Eyes       Right Eye Quality was good. Scan locations included subfoveal. Central Foveal Thickness: 312. Progression has been stable. Findings include abnormal foveal contour.   Left Eye Quality was  good. Scan locations included subfoveal. Central Foveal Thickness: 405. Progression has been stable. Findings include abnormal foveal contour.   Notes OD with small region of intraretinal thickening inferior to the fovea, no active intraretinal fluid or CME.  Will  observe  OS, with chronic active CSME, stable at current 5-week interval will examine again in 6 weeks OS       Intravitreal Injection, Pharmacologic Agent - OS - Left Eye       Time Out 07/07/2020. 3:06 PM. Confirmed correct patient, procedure, site, and patient consented.   Anesthesia Topical anesthesia was used. Anesthetic medications included Akten 3.5%.   Procedure Preparation included 10% betadine to eyelids, 5% betadine to ocular surface, Ofloxacin .   Injection:  1.25 mg Bevacizumab (AVASTIN) SOLN   NDC: [address]-8, Lot: [id number]   Route: Intravitreal, Site: Left Eye, Waste: 0 mg  Post-op Post injection exam found visual acuity of at least counting fingers. The patient tolerated the procedure well. There were no complications. The patient received written and verbal post procedure care education. Post injection medications were not given.                 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  We will repeat exam in 6 weeks      ICD-10-CM   1. 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  2.  3.  Ophthalmic Meds Ordered this visit:  Meds ordered this encounter  Medications  . Bevacizumab (AVASTIN) SOLN 1.25 mg       Return in  about 6 weeks (around 08/18/2020) for OS, dilate, AVASTIN OCT.  There are no Patient Instructions on file for this visit.   Explained the diagnoses, plan, and follow up with the patient and they expressed understanding.  Patient expressed understanding of the importance of proper follow up care.   Clent Demark Rexton Greulich M.D. Diseases & Surgery of the Retina and Vitreous Retina & Diabetic [place] 07/07/20     Abbreviations: M myopia (nearsighted); A astigmatism; H hyperopia (farsighted); P presbyopia; Mrx spectacle prescription;  CTL contact lenses; OD right eye; OS left eye; OU both eyes  XT exotropia; ET esotropia; PEK punctate epithelial keratitis; PEE punctate epithelial erosions; DES dry eye syndrome; MGD meibomian gland dysfunction; ATs artificial tears; PFAT's preservative free artificial tears; [place] nuclear sclerotic cataract; PSC posterior subcapsular cataract; ERM epi-retinal membrane; PVD posterior vitreous detachment; RD retinal detachment; DM diabetes mellitus; DR diabetic retinopathy; NPDR non-proliferative diabetic retinopathy; PDR proliferative diabetic retinopathy; CSME clinically significant macular edema; DME diabetic macular edema; dbh dot blot hemorrhages; CWS cotton wool spot; POAG primary open angle glaucoma; C/D cup-to-disc ratio; HVF humphrey visual field; GVF goldmann visual field; OCT optical coherence tomography; IOP intraocular pressure; BRVO Branch retinal vein occlusion; CRVO central retinal vein occlusion; CRAO central retinal artery occlusion; BRAO branch retinal artery occlusion; RT retinal tear; SB scleral buckle; PPV pars plana vitrectomy; VH Vitreous hemorrhage; PRP panretinal laser photocoagulation; IVK intravitreal kenalog; VMT vitreomacular traction; MH Macular hole;  NVD neovascularization of the disc; NVE neovascularization elsewhere; AREDS age related eye disease study; ARMD age related macular degeneration; POAG primary open angle glaucoma; EBMD epithelial/anterior  basement membrane dystrophy; ACIOL anterior chamber intraocular lens; IOL intraocular lens; PCIOL posterior chamber intraocular lens; Phaco/IOL phacoemulsification with intraocular lens placement; [place] photorefractive keratectomy;  LASIK laser assisted in situ keratomileusis; HTN hypertension; DM diabetes mellitus; COPD chronic obstructive pulmonary disease

## 2020-08-18 ENCOUNTER — Ambulatory Visit (INDEPENDENT_AMBULATORY_CARE_PROVIDER_SITE_OTHER): Payer: Medicare Other | Admitting: Ophthalmology

## 2020-08-18 ENCOUNTER — Other Ambulatory Visit: Payer: Self-pay

## 2020-08-18 ENCOUNTER — Encounter (INDEPENDENT_AMBULATORY_CARE_PROVIDER_SITE_OTHER): Payer: Self-pay | Admitting: Ophthalmology

## 2020-08-18 DIAGNOSIS — E103312 Type 1 diabetes mellitus with moderate nonproliferative diabetic retinopathy with macular edema, left eye: Secondary | ICD-10-CM | POA: Diagnosis not present

## 2020-08-18 MED ORDER — BEVACIZUMAB CHEMO INJECTION 1.25MG/0.05ML SYRINGE FOR KALEIDOSCOPE
1.2500 mg | INTRAVITREAL | Status: AC | PRN
  Administered 2020-08-18: 1.25 mg via INTRAVITREAL

## 2020-08-18 NOTE — Progress Notes (Signed)
08/18/2020     CHIEF COMPLAINT Patient presents for Retina Follow Up   HISTORY OF PRESENT ILLNESS: Dylan Quinn is a 84 y.o. male who presents to the clinic today for:   HPI    Retina Follow Up    Patient presents with  Diabetic Retinopathy.  In left eye.  Severity is moderate.  Duration of 6 weeks.  Since onset it is stable.  I, the attending physician,  performed the HPI with the patient and updated documentation appropriately.          Comments    6 Week NPDR f\u OS. Possible Avastin OS. OCT  Pt states vision is doing better with new gls rx. Denies any complaints. Using gtts as directed. BGL: 97       Last edited by Tilda Franco on 08/18/2020  2:26 PM. (History)      Referring physician: Tracie Harrier, [facility] [place],  [place] [address]  HISTORICAL INFORMATION:   Selected notes from the MEDICAL RECORD NUMBER    Lab Results  Component Value Date   HGBA1C 10.4 10/09/2016     CURRENT MEDICATIONS: Current Outpatient Medications (Ophthalmic Drugs)  Medication Sig  . brimonidine-timolol (COMBIGAN) 0.2-0.5 % ophthalmic solution Place 1 drop into both eyes 2 (two) times daily.   . dorzolamide (TRUSOPT) 2 % ophthalmic solution Place 1 drop into both eyes 2 (two) times daily.  Marland Kitchen latanoprost (XALATAN) 0.005 % ophthalmic solution Place 1 drop into both eyes at bedtime.   No current facility-administered medications for this visit. (Ophthalmic Drugs)   Current Outpatient Medications (Other)  Medication Sig  . clobetasol cream (TEMOVATE) 0.98 % Apply 1 application topically 2 (two) times daily as needed (for rash).   . colchicine 0.6 MG tablet Take 2 tablets by mouth. 2 tablets at first sign of gout flare followed by 1  . Cyanocobalamin (B-12 IJ) Inject 1 mcg as directed every 30 (thirty) days.  Mariane Baumgarten Sodium (COLACE PO) Take 2 capsules by mouth as needed.  Marland Kitchen glucagon (GLUCAGON EMERGENCY) 1 MG injection Inject 1 mg  into the muscle once as needed.  . hydrochlorothiazide (MICROZIDE) 12.5 MG capsule Take 1 capsule by mouth daily.  . Insulin Glargine (LANTUS SOLOSTAR) 100 UNIT/ML Solostar Pen Inject 18 Units into the skin daily.   . insulin lispro (HUMALOG) 100 UNIT/ML injection Inject 0-10 Units into the skin 3 (three) times daily before meals. + sliding scale if BG>150  . levocetirizine (XYZAL) 5 MG tablet Take 1 tablet by mouth daily. In evening  . olmesartan (BENICAR) 40 MG tablet Take 1 tablet (40 mg total) by mouth daily.  . potassium chloride SA (K-DUR,KLOR-CON) 20 MEQ tablet Take 2 tablets (40 mEq total) by mouth daily.  . pregabalin (LYRICA) 100 MG capsule Take 1 capsule (100 mg total) by mouth 2 (two) times daily.  . pregabalin (LYRICA) 25 MG capsule Take 25 mg by mouth as directed.   No current facility-administered medications for this visit. (Other)      REVIEW OF SYSTEMS: ROS    Positive for: Endocrine   Last edited by Tilda Franco on 08/18/2020  2:20 PM. (History)       ALLERGIES Allergies  Allergen Reactions  . Lisinopril Hives    hives    PAST MEDICAL HISTORY Past Medical History:  Diagnosis Date  . BENIGN PROSTATIC HYPERTROPHY 07/19/2009  . COLONIC POLYPS 02/14/2010  . DEPRESSION 03/16/2008  . DIABETES MELLITUS, TYPE I 06/30/2007  .  Dyslipidemia   . FOLLICULITIS 02/26/92  . GLAUCOMA 07/19/2009  . HEARING LOSS 02/14/2010  . HYPERTENSION 05/14/2008  . HYPOGONADISM, MALE 12/16/2007  . Leukopenia   . LUMBAR RADICULOPATHY, LEFT 02/23/2010  . Other osteoporosis 12/16/2007  . PERIPHERAL NEUROPATHY 06/30/2007  . Prostate cancer ([place])   . PROTEINURIA, MILD 02/14/2010  . Psoriasis   . URINARY CALCULUS 12/27/2010   Past Surgical History:  Procedure Laterality Date  . APPENDECTOMY  1986  . CATARACT EXTRACTION  2006  . CATARACT EXTRACTION  1997  . TEE WITHOUT CARDIOVERSION N/A 12/19/2016   Procedure: TRANSESOPHAGEAL ECHOCARDIOGRAM (TEE);  Surgeon: Pixie Casino, MD;  Location:  [facility] ENDOSCOPY;  Service: Cardiovascular;  Laterality: N/A;    FAMILY HISTORY Family History  Problem Relation Age of Onset  . Cancer Father        Colon Cancer  . Heart disease Father   . High blood pressure Unknown     SOCIAL HISTORY Social History   Tobacco Use  . Smoking status: Never Smoker  . Smokeless tobacco: Never Used  Substance Use Topics  . Alcohol use: No    Comment: rare  . Drug use: No         OPHTHALMIC EXAM: Base Eye Exam    Visual Acuity (Snellen - Linear)      Right Left   Dist cc 20/20 -1 20/25 +   Correction: Glasses       Tonometry (Tonopen, 2:25 PM)      Right Left   Pressure 10 8       Pupils      Pupils Dark Light Shape React APD   Right PERRL 4 3 Round Slow None   Left PERRL 4 3 Round Slow None       Visual Fields      Left Right   Restrictions Partial outer superior temporal, inferior temporal, superior nasal, inferior nasal deficiencies Partial outer superior temporal, inferior temporal, superior nasal, inferior nasal deficiencies       Neuro/Psych    Oriented x3: Yes   Mood/Affect: Normal       Dilation    Left eye: 1.0% Mydriacyl, 2.5% Phenylephrine @ 2:25 PM        Slit Lamp and Fundus Exam    External Exam      Right Left   External Normal Normal       Slit Lamp Exam      Right Left   Lids/Lashes Normal Normal   Conjunctiva/Sclera White and quiet White and quiet   Cornea Clear Clear   Anterior Chamber Deep and quiet Deep and quiet   Iris Round and reactive Round and reactive   Lens Posterior chamber intraocular lens Posterior chamber intraocular lens   Anterior Vitreous Normal Normal       Fundus Exam      Right Left   Posterior Vitreous  Posterior vitreous detachment   Disc  Thin rim   C/D Ratio  0.8   Macula  Macular thickening, Mild clinically significant macular edema, Microaneurysms, Exudates   Vessels  NPDR- Moderate   Periphery  Normal          IMAGING AND PROCEDURES  Imaging and  Procedures for 08/18/20  OCT, Retina - OU - Both Eyes       Right Eye Quality was good. Scan locations included subfoveal. Central Foveal Thickness: 298. Findings include normal foveal contour.   Left Eye Quality was good. Scan locations included subfoveal. Central Foveal Thickness: 387.  Progression has improved. Findings include cystoid macular edema.   Notes OS, CSME much improved 6 weeks after intravitreal Avastin for diabetic macular edema       Intravitreal Injection, Pharmacologic Agent - OS - Left Eye       Time Out 08/18/2020. 3:33 PM. Confirmed correct patient, procedure, site, and patient consented.   Anesthesia Anesthetic medications included Akten 3.5%.   Procedure Preparation included Ofloxacin , 10% betadine to eyelids, 5% betadine to ocular surface. A supplied needle was used.   Injection:  1.25 mg Bevacizumab (AVASTIN) SOLN   NDC: [address]-5   Route: Intravitreal, Site: Left Eye, Waste: 0 mg  Post-op Post injection exam found visual acuity of at least counting fingers. The patient tolerated the procedure well. There were no complications. The patient received written and verbal post procedure care education. Post injection medications were not given.                 ASSESSMENT/PLAN:  Diabetic macular edema of left eye with moderate nonproliferative retinopathy associated with type 1 diabetes mellitus (HCC) OS much improved after recent intravitreal Avastin, 6 weeks, will repeat injection today and examination in 6 to 7 weeks      ICD-10-CM   1. Diabetic macular edema of left eye with moderate nonproliferative retinopathy associated with type 1 diabetes mellitus (HCC)  T61.4431 OCT, Retina - OU - Both Eyes    Intravitreal Injection, Pharmacologic Agent - OS - Left Eye    Bevacizumab (AVASTIN) SOLN 1.25 mg    1.  Repeat intravitreal E[place] OS today and examination in 6 to 7 weeks  2.  3.  Ophthalmic Meds Ordered this visit:  Meds ordered  this encounter  Medications  . Bevacizumab (AVASTIN) SOLN 1.25 mg       Return in about 7 weeks (around 10/06/2020) for dilate, OS, AVASTIN OCT.  There are no Patient Instructions on file for this visit.   Explained the diagnoses, plan, and follow up with the patient and they expressed understanding.  Patient expressed understanding of the importance of proper follow up care.   Clent Demark Cadie Sorci M.D. Diseases & Surgery of the Retina and Vitreous Retina & Diabetic [place] 08/18/20     Abbreviations: M myopia (nearsighted); A astigmatism; H hyperopia (farsighted); P presbyopia; Mrx spectacle prescription;  CTL contact lenses; OD right eye; OS left eye; OU both eyes  XT exotropia; ET esotropia; PEK punctate epithelial keratitis; PEE punctate epithelial erosions; DES dry eye syndrome; MGD meibomian gland dysfunction; ATs artificial tears; PFAT's preservative free artificial tears; [place] nuclear sclerotic cataract; PSC posterior subcapsular cataract; ERM epi-retinal membrane; PVD posterior vitreous detachment; RD retinal detachment; DM diabetes mellitus; DR diabetic retinopathy; NPDR non-proliferative diabetic retinopathy; PDR proliferative diabetic retinopathy; CSME clinically significant macular edema; DME diabetic macular edema; dbh dot blot hemorrhages; CWS cotton wool spot; POAG primary open angle glaucoma; C/D cup-to-disc ratio; HVF humphrey visual field; GVF goldmann visual field; OCT optical coherence tomography; IOP intraocular pressure; BRVO Branch retinal vein occlusion; CRVO central retinal vein occlusion; CRAO central retinal artery occlusion; BRAO branch retinal artery occlusion; RT retinal tear; SB scleral buckle; PPV pars plana vitrectomy; VH Vitreous hemorrhage; PRP panretinal laser photocoagulation; IVK intravitreal kenalog; VMT vitreomacular traction; MH Macular hole;  NVD neovascularization of the disc; NVE neovascularization elsewhere; AREDS age related eye disease study; ARMD  age related macular degeneration; POAG primary open angle glaucoma; EBMD epithelial/anterior basement membrane dystrophy; ACIOL anterior chamber intraocular lens; IOL intraocular lens; PCIOL posterior chamber intraocular lens;  Phaco/IOL phacoemulsification with intraocular lens placement; [place] photorefractive keratectomy; LASIK laser assisted in situ keratomileusis; HTN hypertension; DM diabetes mellitus; COPD chronic obstructive pulmonary disease

## 2020-08-18 NOTE — Assessment & Plan Note (Signed)
OS much improved after recent intravitreal Avastin, 6 weeks, will repeat injection today and examination in 6 to 7 weeks

## 2020-10-10 ENCOUNTER — Encounter (INDEPENDENT_AMBULATORY_CARE_PROVIDER_SITE_OTHER): Payer: Medicare Other | Admitting: Ophthalmology

## 2020-10-20 ENCOUNTER — Encounter (INDEPENDENT_AMBULATORY_CARE_PROVIDER_SITE_OTHER): Payer: Medicare Other | Admitting: Ophthalmology

## 2020-10-25 ENCOUNTER — Ambulatory Visit (INDEPENDENT_AMBULATORY_CARE_PROVIDER_SITE_OTHER): Payer: Medicare Other | Admitting: Ophthalmology

## 2020-10-25 ENCOUNTER — Other Ambulatory Visit: Payer: Self-pay

## 2020-10-25 ENCOUNTER — Encounter (INDEPENDENT_AMBULATORY_CARE_PROVIDER_SITE_OTHER): Payer: Self-pay | Admitting: Ophthalmology

## 2020-10-25 DIAGNOSIS — H401132 Primary open-angle glaucoma, bilateral, moderate stage: Secondary | ICD-10-CM | POA: Diagnosis not present

## 2020-10-25 DIAGNOSIS — E103312 Type 1 diabetes mellitus with moderate nonproliferative diabetic retinopathy with macular edema, left eye: Secondary | ICD-10-CM | POA: Diagnosis not present

## 2020-10-25 DIAGNOSIS — E103391 Type 1 diabetes mellitus with moderate nonproliferative diabetic retinopathy without macular edema, right eye: Secondary | ICD-10-CM | POA: Diagnosis not present

## 2020-10-25 MED ORDER — BEVACIZUMAB CHEMO INJECTION 1.25MG/0.05ML SYRINGE FOR KALEIDOSCOPE
1.2500 mg | INTRAVITREAL | Status: AC | PRN
  Administered 2020-10-25: 1.25 mg via INTRAVITREAL

## 2020-10-25 NOTE — Assessment & Plan Note (Signed)
No significant findings of active CSME OD at this stage

## 2020-10-25 NOTE — Progress Notes (Signed)
10/25/2020     CHIEF COMPLAINT Patient presents for Retina Follow Up   HISTORY OF PRESENT ILLNESS: Dylan Quinn is a 84 y.o. male who presents to the clinic today for:   HPI    Retina Follow Up    Patient presents with  Diabetic Retinopathy.  In left eye.  This started 10 weeks ago.  Severity is mild.  Duration of 10 weeks.  Since onset it is stable.          Comments    10 Week Diabetic F/U OS, poss Avastin OS  Pt denies noticeable changes to [place] OU since last visit. Pt denies ocular pain, flashes of light, or floaters OU.  LBS: 183 this AM       Last edited by Rockie Neighbours, Laura on 10/25/2020  2:49 PM. (History)      Referring physician: Tracie Harrier, [facility] [place],  [place] [address]  HISTORICAL INFORMATION:   Selected notes from the MEDICAL RECORD NUMBER    Lab Results  Component Value Date   HGBA1C 10.4 10/09/2016     CURRENT MEDICATIONS: Current Outpatient Medications (Ophthalmic Drugs)  Medication Sig  . brimonidine-timolol (COMBIGAN) 0.2-0.5 % ophthalmic solution Place 1 drop into both eyes 2 (two) times daily.   . dorzolamide (TRUSOPT) 2 % ophthalmic solution Place 1 drop into both eyes 2 (two) times daily.  Marland Kitchen latanoprost (XALATAN) 0.005 % ophthalmic solution Place 1 drop into both eyes at bedtime.   No current facility-administered medications for this visit. (Ophthalmic Drugs)   Current Outpatient Medications (Other)  Medication Sig  . clobetasol cream (TEMOVATE) 6.06 % Apply 1 application topically 2 (two) times daily as needed (for rash).   . colchicine 0.6 MG tablet Take 2 tablets by mouth. 2 tablets at first sign of gout flare followed by 1  . Cyanocobalamin (B-12 IJ) Inject 1 mcg as directed every 30 (thirty) days.  Mariane Baumgarten Sodium (COLACE PO) Take 2 capsules by mouth as needed.  Marland Kitchen glucagon (GLUCAGON EMERGENCY) 1 MG injection Inject 1 mg into the muscle once as needed.  . hydrochlorothiazide  (MICROZIDE) 12.5 MG capsule Take 1 capsule by mouth daily.  . Insulin Glargine (LANTUS SOLOSTAR) 100 UNIT/ML Solostar Pen Inject 18 Units into the skin daily.   . insulin lispro (HUMALOG) 100 UNIT/ML injection Inject 0-10 Units into the skin 3 (three) times daily before meals. + sliding scale if BG>150  . levocetirizine (XYZAL) 5 MG tablet Take 1 tablet by mouth daily. In evening  . olmesartan (BENICAR) 40 MG tablet Take 1 tablet (40 mg total) by mouth daily.  . potassium chloride SA (K-DUR,KLOR-CON) 20 MEQ tablet Take 2 tablets (40 mEq total) by mouth daily.  . pregabalin (LYRICA) 100 MG capsule Take 1 capsule (100 mg total) by mouth 2 (two) times daily.  . pregabalin (LYRICA) 25 MG capsule Take 25 mg by mouth as directed.   No current facility-administered medications for this visit. (Other)      REVIEW OF SYSTEMS:    ALLERGIES Allergies  Allergen Reactions  . Lisinopril Hives    hives    PAST MEDICAL HISTORY Past Medical History:  Diagnosis Date  . BENIGN PROSTATIC HYPERTROPHY 07/19/2009  . COLONIC POLYPS 02/14/2010  . DEPRESSION 03/16/2008  . DIABETES MELLITUS, TYPE I 06/30/2007  . Dyslipidemia   . FOLLICULITIS 3/0/1601  . GLAUCOMA 07/19/2009  . HEARING LOSS 02/14/2010  . HYPERTENSION 05/14/2008  . HYPOGONADISM, MALE 12/16/2007  . Leukopenia   .  LUMBAR RADICULOPATHY, LEFT 02/23/2010  . Other osteoporosis 12/16/2007  . PERIPHERAL NEUROPATHY 06/30/2007  . Prostate cancer ([place])   . PROTEINURIA, MILD 02/14/2010  . Psoriasis   . URINARY CALCULUS 12/27/2010   Past Surgical History:  Procedure Laterality Date  . APPENDECTOMY  1986  . CATARACT EXTRACTION  2006  . CATARACT EXTRACTION  1997  . TEE WITHOUT CARDIOVERSION N/A 12/19/2016   Procedure: TRANSESOPHAGEAL ECHOCARDIOGRAM (TEE);  Surgeon: Pixie Casino, MD;  Location: [facility] ENDOSCOPY;  Service: Cardiovascular;  Laterality: N/A;    FAMILY HISTORY Family History  Problem Relation Age of Onset  . Cancer Father        Colon  Cancer  . Heart disease Father   . High blood pressure Unknown     SOCIAL HISTORY Social History   Tobacco Use  . Smoking status: Never Smoker  . Smokeless tobacco: Never Used  Substance Use Topics  . Alcohol use: No    Comment: rare  . Drug use: No         OPHTHALMIC EXAM:  Base Eye Exam    Visual Acuity (ETDRS)      Right Left   Dist cc 20/20 -2 20/20 -1   Correction: Glasses       Tonometry (Tonopen, 2:49 PM)      Right Left   Pressure 10 06       Pupils      Pupils Dark Light Shape React APD   Right PERRL 4 3 Round Slow None   Left PERRL 4 3 Round Slow None       Visual Fields (Counting fingers)      Left Right   Restrictions Partial outer superior temporal, inferior temporal, superior nasal, inferior nasal deficiencies Partial outer superior temporal, inferior temporal, superior nasal, inferior nasal deficiencies       Extraocular Movement      Right Left    Full Full       Neuro/Psych    Oriented x3: Yes   Mood/Affect: Normal       Dilation    Left eye: 1.0% Mydriacyl, 2.5% Phenylephrine @ 2:53 PM        Slit Lamp and Fundus Exam    External Exam      Right Left   External Normal Normal       Slit Lamp Exam      Right Left   Lids/Lashes Normal Normal   Conjunctiva/Sclera White and quiet White and quiet   Cornea Clear Clear   Anterior Chamber Deep and quiet Deep and quiet   Iris Round and reactive Round and reactive   Lens Posterior chamber intraocular lens Posterior chamber intraocular lens   Anterior Vitreous Normal Normal       Fundus Exam      Right Left   Posterior Vitreous  Posterior vitreous detachment   Disc  Thin rim   C/D Ratio  0.85   Macula  Macular thickening, Mild clinically significant macular edema, Microaneurysms, Exudates   Vessels  NPDR- Moderate   Periphery  Normal          IMAGING AND PROCEDURES  Imaging and Procedures for 10/25/20  OCT, Retina - OU - Both Eyes       Right Eye Quality was good.  Scan locations included subfoveal. Central Foveal Thickness: 300. Progression has been stable. Findings include normal observations.   Left Eye Quality was good. Scan locations included subfoveal. Central Foveal Thickness: 361. Progression has improved. Findings include cystoid  macular edema.   Notes OS with improved CME, secondary to diabetic macular edema.  Will repeat injection today at 10-week interval and repeat evaluation in 3 months OS  OD no active maculopathy, with some minor CME changes inferior to the fovea       Intravitreal Injection, Pharmacologic Agent - OS - Left Eye       Time Out 10/25/2020. 3:59 PM. Confirmed correct patient, procedure, site, and patient consented.   Anesthesia Topical anesthesia was used. Anesthetic medications included Akten 3.5%.   Procedure Preparation included Ofloxacin , 10% betadine to eyelids, 5% betadine to ocular surface. A 30 gauge needle was used.   Injection:  1.25 mg Bevacizumab (AVASTIN) SOLN   NDC: 70360-001-02, Lot: [id number]   Route: Intravitreal, Site: Left Eye, Waste: 0 mg  Post-op Post injection exam found visual acuity of at least counting fingers. The patient tolerated the procedure well. There were no complications. The patient received written and verbal post procedure care education. Post injection medications were not given.                 ASSESSMENT/PLAN:  Diabetic macular edema of left eye with moderate nonproliferative retinopathy associated with type 1 diabetes mellitus ([place]) OS continues to improve CSME at 10-week follow-up today.  Repeat injection Avastin today and examination in 3 months OU  Moderate nonproliferative diabetic retinopathy of right eye (HCC) No significant findings of active CSME OD at this stage  Primary open angle glaucoma of both eyes, moderate stage Continue current therapy as prescribed elsewhere      ICD-10-CM   1. Diabetic macular edema of left eye with moderate  nonproliferative retinopathy associated with type 1 diabetes mellitus (HCC)  E33.2951 OCT, Retina - OU - Both Eyes    Intravitreal Injection, Pharmacologic Agent - OS - Left Eye    Bevacizumab (AVASTIN) SOLN 1.25 mg  2. Moderate nonproliferative diabetic retinopathy of right eye without macular edema associated with type 1 diabetes mellitus ([place])  E10.3391   3. Primary open angle glaucoma of both eyes, moderate stage  H40.1132     1.  OS, CSME continues to improve nicely, currently at 10-week follow-up.  Repeat injection Avastin today and examination left eye 3 months.  2.  Dilate OU next possible Avastin OS  3.  Ophthalmic Meds Ordered this visit:  Meds ordered this encounter  Medications  . Bevacizumab (AVASTIN) SOLN 1.25 mg       Return in about 3 months (around 01/25/2021) for DILATE OU, AVASTIN OCT, OS.  There are no Patient Instructions on file for this visit.   Explained the diagnoses, plan, and follow up with the patient and they expressed understanding.  Patient expressed understanding of the importance of proper follow up care.   Clent Demark Sharni Negron M.D. Diseases & Surgery of the Retina and Vitreous Retina & Diabetic [place] 10/25/20     Abbreviations: M myopia (nearsighted); A astigmatism; H hyperopia (farsighted); P presbyopia; Mrx spectacle prescription;  CTL contact lenses; OD right eye; OS left eye; OU both eyes  XT exotropia; ET esotropia; PEK punctate epithelial keratitis; PEE punctate epithelial erosions; DES dry eye syndrome; MGD meibomian gland dysfunction; ATs artificial tears; PFAT's preservative free artificial tears; [place] nuclear sclerotic cataract; PSC posterior subcapsular cataract; ERM epi-retinal membrane; PVD posterior vitreous detachment; RD retinal detachment; DM diabetes mellitus; DR diabetic retinopathy; NPDR non-proliferative diabetic retinopathy; PDR proliferative diabetic retinopathy; CSME clinically significant macular edema; DME diabetic macular  edema; dbh dot blot hemorrhages; CWS cotton  wool spot; POAG primary open angle glaucoma; C/D cup-to-disc ratio; HVF humphrey visual field; GVF goldmann visual field; OCT optical coherence tomography; IOP intraocular pressure; BRVO Branch retinal vein occlusion; CRVO central retinal vein occlusion; CRAO central retinal artery occlusion; BRAO branch retinal artery occlusion; RT retinal tear; SB scleral buckle; PPV pars plana vitrectomy; VH Vitreous hemorrhage; PRP panretinal laser photocoagulation; IVK intravitreal kenalog; VMT vitreomacular traction; MH Macular hole;  NVD neovascularization of the disc; NVE neovascularization elsewhere; AREDS age related eye disease study; ARMD age related macular degeneration; POAG primary open angle glaucoma; EBMD epithelial/anterior basement membrane dystrophy; ACIOL anterior chamber intraocular lens; IOL intraocular lens; PCIOL posterior chamber intraocular lens; Phaco/IOL phacoemulsification with intraocular lens placement; [place] photorefractive keratectomy; LASIK laser assisted in situ keratomileusis; HTN hypertension; DM diabetes mellitus; COPD chronic obstructive pulmonary disease

## 2020-10-25 NOTE — Assessment & Plan Note (Signed)
Continue current therapy as prescribed elsewhere

## 2020-10-25 NOTE — Assessment & Plan Note (Signed)
OS continues to improve CSME at 10-week follow-up today.  Repeat injection Avastin today and examination in 3 months OU

## 2021-01-26 ENCOUNTER — Encounter (INDEPENDENT_AMBULATORY_CARE_PROVIDER_SITE_OTHER): Payer: Self-pay | Admitting: Ophthalmology

## 2021-01-26 ENCOUNTER — Ambulatory Visit (INDEPENDENT_AMBULATORY_CARE_PROVIDER_SITE_OTHER): Payer: Medicare Other | Admitting: Ophthalmology

## 2021-01-26 ENCOUNTER — Other Ambulatory Visit: Payer: Self-pay

## 2021-01-26 DIAGNOSIS — E103312 Type 1 diabetes mellitus with moderate nonproliferative diabetic retinopathy with macular edema, left eye: Secondary | ICD-10-CM | POA: Diagnosis not present

## 2021-01-26 DIAGNOSIS — E103391 Type 1 diabetes mellitus with moderate nonproliferative diabetic retinopathy without macular edema, right eye: Secondary | ICD-10-CM | POA: Diagnosis not present

## 2021-01-26 MED ORDER — BEVACIZUMAB 2.5 MG/0.1ML IZ SOSY
2.5000 mg | PREFILLED_SYRINGE | INTRAVITREAL | Status: AC | PRN
  Administered 2021-01-26: 2.5 mg via INTRAVITREAL

## 2021-01-26 NOTE — Progress Notes (Signed)
01/26/2021     CHIEF COMPLAINT Patient presents for Retina Follow Up (3 MO FU OU, POSS AVASTIN OS///Pt reports stable vision OU. Pt denies any new F/F, pain, or pressure OU.///Last A1C: 10.0  taken 12/2020///Last BS: 221 this AM  )   HISTORY OF PRESENT ILLNESS: Dylan Quinn is a 85 y.o. male who presents to the clinic today for:   HPI    Retina Follow Up    Patient presents with  Diabetic Retinopathy.  In both eyes.  This started 3 months ago.  Duration of 3 months.  Since onset it is stable. Additional comments: 3 MO FU OU, POSS AVASTIN OS   Pt reports stable vision OU. Pt denies any new F/F, pain, or pressure OU.   Last A1C: 10.0  taken 12/2020   Last BS: 221 this AM         Last edited by Nichola Sizer D on 01/26/2021  1:46 PM. (History)      Referring physician: Tracie Harrier, MD [address] [facility] [place],  [place] [address]  HISTORICAL INFORMATION:   Selected notes from the MEDICAL RECORD NUMBER    Lab Results  Component Value Date   HGBA1C 10.4 10/09/2016     CURRENT MEDICATIONS: Current Outpatient Medications (Ophthalmic Drugs)  Medication Sig  . brimonidine-timolol (COMBIGAN) 0.2-0.5 % ophthalmic solution Place 1 drop into both eyes 2 (two) times daily.   . dorzolamide (TRUSOPT) 2 % ophthalmic solution Place 1 drop into both eyes 2 (two) times daily.  Marland Kitchen latanoprost (XALATAN) 0.005 % ophthalmic solution Place 1 drop into both eyes at bedtime.   No current facility-administered medications for this visit. (Ophthalmic Drugs)   Current Outpatient Medications (Other)  Medication Sig  . clobetasol cream (TEMOVATE) 0.62 % Apply 1 application topically 2 (two) times daily as needed (for rash).   . colchicine 0.6 MG tablet Take 2 tablets by mouth. 2 tablets at first sign of gout flare followed by 1  . Cyanocobalamin (B-12 IJ) Inject 1 mcg as directed every 30 (thirty) days.  Mariane Baumgarten Sodium (COLACE PO) Take 2 capsules by mouth  as needed.  Marland Kitchen glucagon (GLUCAGON EMERGENCY) 1 MG injection Inject 1 mg into the muscle once as needed.  . hydrochlorothiazide (MICROZIDE) 12.5 MG capsule Take 1 capsule by mouth daily.  . Insulin Glargine (LANTUS SOLOSTAR) 100 UNIT/ML Solostar Pen Inject 18 Units into the skin daily.   . insulin lispro (HUMALOG) 100 UNIT/ML injection Inject 0-10 Units into the skin 3 (three) times daily before meals. + sliding scale if BG>150  . levocetirizine (XYZAL) 5 MG tablet Take 1 tablet by mouth daily. In evening  . olmesartan (BENICAR) 40 MG tablet Take 1 tablet (40 mg total) by mouth daily.  . potassium chloride SA (K-DUR,KLOR-CON) 20 MEQ tablet Take 2 tablets (40 mEq total) by mouth daily.  . pregabalin (LYRICA) 100 MG capsule Take 1 capsule (100 mg total) by mouth 2 (two) times daily.  . pregabalin (LYRICA) 25 MG capsule Take 25 mg by mouth as directed.   No current facility-administered medications for this visit. (Other)      REVIEW OF SYSTEMS:    ALLERGIES Allergies  Allergen Reactions  . Lisinopril Hives    hives    PAST MEDICAL HISTORY Past Medical History:  Diagnosis Date  . BENIGN PROSTATIC HYPERTROPHY 07/19/2009  . COLONIC POLYPS 02/14/2010  . DEPRESSION 03/16/2008  . DIABETES MELLITUS, TYPE I 06/30/2007  . Dyslipidemia   . FOLLICULITIS  07/28/2008  . GLAUCOMA 07/19/2009  . HEARING LOSS 02/14/2010  . HYPERTENSION 05/14/2008  . HYPOGONADISM, MALE 12/16/2007  . Leukopenia   . LUMBAR RADICULOPATHY, LEFT 02/23/2010  . Other osteoporosis 12/16/2007  . PERIPHERAL NEUROPATHY 06/30/2007  . Prostate cancer ([place])   . PROTEINURIA, MILD 02/14/2010  . Psoriasis   . URINARY CALCULUS 12/27/2010   Past Surgical History:  Procedure Laterality Date  . APPENDECTOMY  1986  . CATARACT EXTRACTION  2006  . CATARACT EXTRACTION  1997  . TEE WITHOUT CARDIOVERSION N/A 12/19/2016   Procedure: TRANSESOPHAGEAL ECHOCARDIOGRAM (TEE);  Surgeon: Pixie Casino, MD;  Location: [facility] ENDOSCOPY;  Service:  Cardiovascular;  Laterality: N/A;    FAMILY HISTORY Family History  Problem Relation Age of Onset  . Cancer Father        Colon Cancer  . Heart disease Father   . High blood pressure Unknown     SOCIAL HISTORY Social History   Tobacco Use  . Smoking status: Never Smoker  . Smokeless tobacco: Never Used  Substance Use Topics  . Alcohol use: No    Comment: rare  . Drug use: No         OPHTHALMIC EXAM:  Base Eye Exam    Visual Acuity (ETDRS)      Right Left   Dist cc 20/30 +2 20/20   Dist ph cc NI    Correction: Glasses       Tonometry (Tonopen, 1:53 PM)      Right Left   Pressure 9 8       Pupils      Pupils Dark Light Shape React APD   Right PERRL 4 3 Round Slow None   Left PERRL 4 3 Round Slow None       Visual Fields (Counting fingers)      Left Right   Restrictions Partial outer superior temporal, inferior temporal, superior nasal, inferior nasal deficiencies Partial outer superior temporal, inferior temporal, superior nasal, inferior nasal deficiencies       Extraocular Movement      Right Left    Full Full       Neuro/Psych    Oriented x3: Yes   Mood/Affect: Normal       Dilation    Both eyes: 1.0% Mydriacyl, 2.5% Phenylephrine @ 1:54 PM        Slit Lamp and Fundus Exam    External Exam      Right Left   External Normal Normal       Slit Lamp Exam      Right Left   Lids/Lashes Normal Normal   Conjunctiva/Sclera White and quiet White and quiet   Cornea Clear Clear   Anterior Chamber Deep and quiet Deep and quiet   Iris Round and reactive Round and reactive   Lens Posterior chamber intraocular lens Posterior chamber intraocular lens   Anterior Vitreous Normal Normal       Fundus Exam      Right Left   Posterior Vitreous  Posterior vitreous detachment   Disc  Thin rim   C/D Ratio  0.85   Macula  Macular thickening, Mild clinically significant macular edema, Microaneurysms, Exudates   Vessels  NPDR- Moderate   Periphery   Normal          IMAGING AND PROCEDURES  Imaging and Procedures for 01/26/21  OCT, Retina - OU - Both Eyes       Right Eye Quality was good. Scan locations included subfoveal. Central Foveal  Thickness: 314. Progression has been stable.   Left Eye Quality was good. Scan locations included subfoveal. Central Foveal Thickness: 322. Progression has improved. Findings include cystoid macular edema.   Notes Improved CSME OS some 3 months post injection of vitreal Avastin , Will repeat today and likely simple examination next in 4 months without treatment       Intravitreal Injection, Pharmacologic Agent - OS - Left Eye       Time Out 01/26/2021. 2:36 PM. Confirmed correct patient, procedure, site, and patient consented.   Anesthesia Topical anesthesia was used. Anesthetic medications included Akten 3.5%.   Procedure Preparation included Ofloxacin , 10% betadine to eyelids, 5% betadine to ocular surface. A 30 gauge needle was used.   Injection:  2.5 mg Bevacizumab (AVASTIN) 2.5mg /0.84mL SOSY   NDC: M6102387, [id number]   Route: Intravitreal, Site: Left Eye  Post-op Post injection exam found visual acuity of at least counting fingers. The patient tolerated the procedure well. There were no complications. The patient received written and verbal post procedure care education. Post injection medications were not given.                 ASSESSMENT/PLAN:  Diabetic macular edema of left eye with moderate nonproliferative retinopathy associated with type 1 diabetes mellitus (HCC) Much less CSME OS, will repeat injection Avastin today, and follow-up in 4 months      ICD-10-CM   1. Diabetic macular edema of left eye with moderate nonproliferative retinopathy associated with type 1 diabetes mellitus (HCC)  [id number] OCT, Retina - OU - Both Eyes    Intravitreal Injection, Pharmacologic Agent - OS - Left Eye    bevacizumab (AVASTIN) SOSY 2.5 mg  2. Moderate nonproliferative  diabetic retinopathy of right eye without macular edema associated with type 1 diabetes mellitus (HCC)  E10.3391 OCT, Retina - OU - Both Eyes    1.  2.  3.  Ophthalmic Meds Ordered this visit:  Meds ordered this encounter  Medications  . bevacizumab (AVASTIN) SOSY 2.5 mg       Return in about 4 months (around 05/26/2021) for DILATE OU, COLOR FP, OCT.  There are no Patient Instructions on file for this visit.   Explained the diagnoses, plan, and follow up with the patient and they expressed understanding.  Patient expressed understanding of the importance of proper follow up care.   Dylan Quinn M.D. Diseases & Surgery of the Retina and Vitreous Retina & Diabetic [place] 01/26/21     Abbreviations: M myopia (nearsighted); A astigmatism; H hyperopia (farsighted); P presbyopia; Mrx spectacle prescription;  CTL contact lenses; OD right eye; OS left eye; OU both eyes  XT exotropia; ET esotropia; PEK punctate epithelial keratitis; PEE punctate epithelial erosions; DES dry eye syndrome; MGD meibomian gland dysfunction; ATs artificial tears; PFAT's preservative free artificial tears; [place] nuclear sclerotic cataract; PSC posterior subcapsular cataract; ERM epi-retinal membrane; PVD posterior vitreous detachment; RD retinal detachment; DM diabetes mellitus; DR diabetic retinopathy; NPDR non-proliferative diabetic retinopathy; PDR proliferative diabetic retinopathy; CSME clinically significant macular edema; DME diabetic macular edema; dbh dot blot hemorrhages; CWS cotton wool spot; POAG primary open angle glaucoma; C/D cup-to-disc ratio; HVF humphrey visual field; GVF goldmann visual field; OCT optical coherence tomography; IOP intraocular pressure; BRVO Branch retinal vein occlusion; CRVO central retinal vein occlusion; CRAO central retinal artery occlusion; BRAO branch retinal artery occlusion; RT retinal tear; SB scleral buckle; PPV pars plana vitrectomy; VH Vitreous hemorrhage; PRP  panretinal laser photocoagulation; IVK intravitreal kenalog; VMT  vitreomacular traction; MH Macular hole;  NVD neovascularization of the disc; NVE neovascularization elsewhere; AREDS age related eye disease study; ARMD age related macular degeneration; POAG primary open angle glaucoma; EBMD epithelial/anterior basement membrane dystrophy; ACIOL anterior chamber intraocular lens; IOL intraocular lens; PCIOL posterior chamber intraocular lens; Phaco/IOL phacoemulsification with intraocular lens placement; PRK photorefractive keratectomy; LASIK laser assisted in situ keratomileusis; HTN hypertension; DM diabetes mellitus; COPD chronic obstructive pulmonary disease   01/26/2021     CHIEF COMPLAINT Patient presents for Retina Follow Up (3 MO FU OU, POSS AVASTIN OS///Pt reports stable vision OU. Pt denies any new F/F, pain, or pressure OU.///Last A1C: 10.0  taken 12/2020///Last BS: 221 this AM  )   HISTORY OF PRESENT ILLNESS: Dylan Quinn is a 85 y.o. male who presents to the clinic today for:   HPI    Retina Follow Up    Patient presents with  Diabetic Retinopathy.  In both eyes.  This started 3 months ago.  Duration of 3 months.  Since onset it is stable. Additional comments: 3 MO FU OU, POSS AVASTIN OS   Pt reports stable vision OU. Pt denies any new F/F, pain, or pressure OU.   Last A1C: 10.0  taken 12/2020   Last BS: 221 this AM         Last edited by Nichola Sizer D on 01/26/2021  1:46 PM. (History)      Referring physician: Tracie Harrier, MD [address] [facility] [place],  [place] [address]  HISTORICAL INFORMATION:   Selected notes from the MEDICAL RECORD NUMBER    Lab Results  Component Value Date   HGBA1C 10.4 10/09/2016     CURRENT MEDICATIONS: Current Outpatient Medications (Ophthalmic Drugs)  Medication Sig  . brimonidine-timolol (COMBIGAN) 0.2-0.5 % ophthalmic solution Place 1 drop into both eyes 2 (two) times daily.   . dorzolamide  (TRUSOPT) 2 % ophthalmic solution Place 1 drop into both eyes 2 (two) times daily.  Marland Kitchen latanoprost (XALATAN) 0.005 % ophthalmic solution Place 1 drop into both eyes at bedtime.   No current facility-administered medications for this visit. (Ophthalmic Drugs)   Current Outpatient Medications (Other)  Medication Sig  . clobetasol cream (TEMOVATE) [id number] % Apply 1 application topically 2 (two) times daily as needed (for rash).   . colchicine 0.6 MG tablet Take 2 tablets by mouth. 2 tablets at first sign of gout flare followed by 1  . Cyanocobalamin (B-12 IJ) Inject 1 mcg as directed every 30 (thirty) days.  Mariane Baumgarten Sodium (COLACE PO) Take 2 capsules by mouth as needed.  Marland Kitchen glucagon (GLUCAGON EMERGENCY) 1 MG injection Inject 1 mg into the muscle once as needed.  . hydrochlorothiazide (MICROZIDE) 12.5 MG capsule Take 1 capsule by mouth daily.  . Insulin Glargine (LANTUS SOLOSTAR) 100 UNIT/ML Solostar Pen Inject 18 Units into the skin daily.   . insulin lispro (HUMALOG) 100 UNIT/ML injection Inject 0-10 Units into the skin 3 (three) times daily before meals. + sliding scale if BG>150  . levocetirizine (XYZAL) 5 MG tablet Take 1 tablet by mouth daily. In evening  . olmesartan (BENICAR) 40 MG tablet Take 1 tablet (40 mg total) by mouth daily.  . potassium chloride SA (K-DUR,KLOR-CON) 20 MEQ tablet Take 2 tablets (40 mEq total) by mouth daily.  . pregabalin (LYRICA) 100 MG capsule Take 1 capsule (100 mg total) by mouth 2 (two) times daily.  . pregabalin (LYRICA) 25 MG capsule Take 25 mg by mouth as directed.  No current facility-administered medications for this visit. (Other)      REVIEW OF SYSTEMS:    ALLERGIES Allergies  Allergen Reactions  . Lisinopril Hives    hives    PAST MEDICAL HISTORY Past Medical History:  Diagnosis Date  . BENIGN PROSTATIC HYPERTROPHY 07/19/2009  . COLONIC POLYPS 02/14/2010  . DEPRESSION 03/16/2008  . DIABETES MELLITUS, TYPE I 06/30/2007  . Dyslipidemia   .  FOLLICULITIS 0000000  . GLAUCOMA 07/19/2009  . HEARING LOSS 02/14/2010  . HYPERTENSION 05/14/2008  . HYPOGONADISM, MALE 12/16/2007  . Leukopenia   . LUMBAR RADICULOPATHY, LEFT 02/23/2010  . Other osteoporosis 12/16/2007  . PERIPHERAL NEUROPATHY 06/30/2007  . Prostate cancer ([place])   . PROTEINURIA, MILD 02/14/2010  . Psoriasis   . URINARY CALCULUS 12/27/2010   Past Surgical History:  Procedure Laterality Date  . APPENDECTOMY  1986  . CATARACT EXTRACTION  2006  . CATARACT EXTRACTION  1997  . TEE WITHOUT CARDIOVERSION N/A 12/19/2016   Procedure: TRANSESOPHAGEAL ECHOCARDIOGRAM (TEE);  Surgeon: Pixie Casino, MD;  Location: [facility] ENDOSCOPY;  Service: Cardiovascular;  Laterality: N/A;    FAMILY HISTORY Family History  Problem Relation Age of Onset  . Cancer Father        Colon Cancer  . Heart disease Father   . High blood pressure Unknown     SOCIAL HISTORY Social History   Tobacco Use  . Smoking status: Never Smoker  . Smokeless tobacco: Never Used  Substance Use Topics  . Alcohol use: No    Comment: rare  . Drug use: No         OPHTHALMIC EXAM:  Base Eye Exam    Visual Acuity (ETDRS)      Right Left   Dist cc 20/30 +2 20/20   Dist ph cc NI    Correction: Glasses       Tonometry (Tonopen, 1:53 PM)      Right Left   Pressure 9 8       Pupils      Pupils Dark Light Shape React APD   Right PERRL 4 3 Round Slow None   Left PERRL 4 3 Round Slow None       Visual Fields (Counting fingers)      Left Right   Restrictions Partial outer superior temporal, inferior temporal, superior nasal, inferior nasal deficiencies Partial outer superior temporal, inferior temporal, superior nasal, inferior nasal deficiencies       Extraocular Movement      Right Left    Full Full       Neuro/Psych    Oriented x3: Yes   Mood/Affect: Normal       Dilation    Both eyes: 1.0% Mydriacyl, 2.5% Phenylephrine @ 1:54 PM        Slit Lamp and Fundus Exam    External Exam       Right Left   External Normal Normal       Slit Lamp Exam      Right Left   Lids/Lashes Normal Normal   Conjunctiva/Sclera White and quiet White and quiet   Cornea Clear Clear   Anterior Chamber Deep and quiet Deep and quiet   Iris Round and reactive Round and reactive   Lens Posterior chamber intraocular lens Posterior chamber intraocular lens   Anterior Vitreous Normal Normal       Fundus Exam      Right Left   Posterior Vitreous  Posterior vitreous detachment   Disc  Thin rim  C/D Ratio  0.85   Macula  Macular thickening, Mild clinically significant macular edema, Microaneurysms, Exudates   Vessels  NPDR- Moderate   Periphery  Normal          IMAGING AND PROCEDURES  Imaging and Procedures for 01/26/21  OCT, Retina - OU - Both Eyes       Right Eye Quality was good. Scan locations included subfoveal. Central Foveal Thickness: 314. Progression has been stable.   Left Eye Quality was good. Scan locations included subfoveal. Central Foveal Thickness: 322. Progression has improved. Findings include cystoid macular edema.   Notes Improved CSME OS some 3 months post injection of vitreal Avastin , Will repeat today and likely simple examination next in 4 months without treatment       Intravitreal Injection, Pharmacologic Agent - OS - Left Eye       Time Out 01/26/2021. 2:36 PM. Confirmed correct patient, procedure, site, and patient consented.   Anesthesia Topical anesthesia was used. Anesthetic medications included Akten 3.5%.   Procedure Preparation included Ofloxacin , 10% betadine to eyelids, 5% betadine to ocular surface. A 30 gauge needle was used.   Injection:  2.5 mg Bevacizumab (AVASTIN) 2.5mg /0.51mL SOSY   NDC: M6102387, [id number]   Route: Intravitreal, Site: Left Eye  Post-op Post injection exam found visual acuity of at least counting fingers. The patient tolerated the procedure well. There were no complications. The patient received written  and verbal post procedure care education. Post injection medications were not given.                 ASSESSMENT/PLAN:  Diabetic macular edema of left eye with moderate nonproliferative retinopathy associated with type 1 diabetes mellitus (HCC) Much less CSME OS, will repeat injection Avastin today, and follow-up in 4 months      ICD-10-CM   1. Diabetic macular edema of left eye with moderate nonproliferative retinopathy associated with type 1 diabetes mellitus (HCC)  [id number] OCT, Retina - OU - Both Eyes    Intravitreal Injection, Pharmacologic Agent - OS - Left Eye    bevacizumab (AVASTIN) SOSY 2.5 mg  2. Moderate nonproliferative diabetic retinopathy of right eye without macular edema associated with type 1 diabetes mellitus (HCC)  E10.3391 OCT, Retina - OU - Both Eyes    1.  OS with diabetic CSME much improved now at 68-month examination will repeat injection today and examination again in 4 months no planned injection  2.  OD, moderate nonproliferative diabetic retinopathy with no signs of active CSME will continue to monitor and observe  3.  Ophthalmic Meds Ordered this visit:  Meds ordered this encounter  Medications  . bevacizumab (AVASTIN) SOSY 2.5 mg       Return in about 4 months (around 05/26/2021) for DILATE OU, COLOR FP, OCT.  There are no Patient Instructions on file for this visit.   Explained the diagnoses, plan, and follow up with the patient and they expressed understanding.  Patient expressed understanding of the importance of proper follow up care.   Dylan Quinn M.D. Diseases & Surgery of the Retina and Vitreous Retina & Diabetic [place] 01/26/21     Abbreviations: M myopia (nearsighted); A astigmatism; H hyperopia (farsighted); P presbyopia; Mrx spectacle prescription;  CTL contact lenses; OD right eye; OS left eye; OU both eyes  XT exotropia; ET esotropia; PEK punctate epithelial keratitis; PEE punctate epithelial erosions; DES dry eye  syndrome; MGD meibomian gland dysfunction; ATs artificial tears; PFAT's preservative free artificial tears;  [place] nuclear sclerotic cataract; PSC posterior subcapsular cataract; ERM epi-retinal membrane; PVD posterior vitreous detachment; RD retinal detachment; DM diabetes mellitus; DR diabetic retinopathy; NPDR non-proliferative diabetic retinopathy; PDR proliferative diabetic retinopathy; CSME clinically significant macular edema; DME diabetic macular edema; dbh dot blot hemorrhages; CWS cotton wool spot; POAG primary open angle glaucoma; C/D cup-to-disc ratio; HVF humphrey visual field; GVF goldmann visual field; OCT optical coherence tomography; IOP intraocular pressure; BRVO Branch retinal vein occlusion; CRVO central retinal vein occlusion; CRAO central retinal artery occlusion; BRAO branch retinal artery occlusion; RT retinal tear; SB scleral buckle; PPV pars plana vitrectomy; VH Vitreous hemorrhage; PRP panretinal laser photocoagulation; IVK intravitreal kenalog; VMT vitreomacular traction; MH Macular hole;  NVD neovascularization of the disc; NVE neovascularization elsewhere; AREDS age related eye disease study; ARMD age related macular degeneration; POAG primary open angle glaucoma; EBMD epithelial/anterior basement membrane dystrophy; ACIOL anterior chamber intraocular lens; IOL intraocular lens; PCIOL posterior chamber intraocular lens; Phaco/IOL phacoemulsification with intraocular lens placement; [place] photorefractive keratectomy; LASIK laser assisted in situ keratomileusis; HTN hypertension; DM diabetes mellitus; COPD chronic obstructive pulmonary disease

## 2021-01-26 NOTE — Assessment & Plan Note (Signed)
Much less CSME OS, will repeat injection Avastin today, and follow-up in 4 months

## 2021-04-11 ENCOUNTER — Other Ambulatory Visit: Payer: Self-pay

## 2021-04-11 ENCOUNTER — Ambulatory Visit (INDEPENDENT_AMBULATORY_CARE_PROVIDER_SITE_OTHER): Payer: Medicare Other | Admitting: Gastroenterology

## 2021-04-11 ENCOUNTER — Encounter: Payer: Self-pay | Admitting: Gastroenterology

## 2021-04-11 VITALS — BP 133/74 | HR 78 | Temp 98.0°F | Wt 195.6 lb

## 2021-04-11 DIAGNOSIS — K648 Other hemorrhoids: Secondary | ICD-10-CM | POA: Diagnosis not present

## 2021-04-11 MED ORDER — HYDROCORTISONE ACETATE 25 MG RE SUPP: 25.0000 mg | Freq: Every evening | RECTAL | 0 refills | Status: AC

## 2021-04-11 NOTE — Progress Notes (Signed)
ERROR

## 2021-04-11 NOTE — Progress Notes (Signed)
Jonathon Bellows MD, MRCP(U.K) [address]  [place]  [place], [place] [address]  Main: [phone]  Fax: [phone]   Gastroenterology Consultation  Referring Provider:     Tracie Harrier, MD Primary Care Physician:  Tracie Harrier, MD Primary Gastroenterologist:  Dr. Jonathon Bellows  Reason for Consultation:     Issues with hemorrhoids        HPI:   Dylan Quinn is a 85 y.o. y/o male referred for consultation & management  by Dr. Tracie Harrier, MD.     He states that for the past few months he feels an abnormal sensation in his anal area.  Denies any bleeding.  He thinks it is hemorrhoids.  Denies any constipation.  Denies any change in his bowel habits.  No other complaints.  Past Medical History:  Diagnosis Date  . BENIGN PROSTATIC HYPERTROPHY 07/19/2009  . COLONIC POLYPS 02/14/2010  . DEPRESSION 03/16/2008  . DIABETES MELLITUS, TYPE I 06/30/2007  . Dyslipidemia   . FOLLICULITIS 07/29/7618  . GLAUCOMA 07/19/2009  . HEARING LOSS 02/14/2010  . HYPERTENSION 05/14/2008  . HYPOGONADISM, MALE 12/16/2007  . Leukopenia   . LUMBAR RADICULOPATHY, LEFT 02/23/2010  . Other osteoporosis 12/16/2007  . PERIPHERAL NEUROPATHY 06/30/2007  . Prostate cancer ([place])   . PROTEINURIA, MILD 02/14/2010  . Psoriasis   . URINARY CALCULUS 12/27/2010    Past Surgical History:  Procedure Laterality Date  . APPENDECTOMY  1986  . CATARACT EXTRACTION  2006  . CATARACT EXTRACTION  1997  . TEE WITHOUT CARDIOVERSION N/A 12/19/2016   Procedure: TRANSESOPHAGEAL ECHOCARDIOGRAM (TEE);  Surgeon: Pixie Casino, MD;  Location: [facility] ENDOSCOPY;  Service: Cardiovascular;  Laterality: N/A;    Prior to Admission medications   Medication Sig Start Date End Date Taking? Authorizing Provider  brimonidine-timolol (COMBIGAN) 0.2-0.5 % ophthalmic solution Place 1 drop into both eyes 2 (two) times daily.     [provider]  clobetasol cream (TEMOVATE) 5.09 % Apply 1 application topically 2 (two) times  daily as needed (for rash).     [provider]  colchicine 0.6 MG tablet Take 2 tablets by mouth. 2 tablets at first sign of gout flare followed by 1 03/07/17   [provider]  Cyanocobalamin (B-12 IJ) Inject 1 mcg as directed every 30 (thirty) days.    [provider]  Docusate Sodium (COLACE PO) Take 2 capsules by mouth as needed.    [provider]  dorzolamide (TRUSOPT) 2 % ophthalmic solution Place 1 drop into both eyes 2 (two) times daily.    [provider]  glucagon (GLUCAGON EMERGENCY) 1 MG injection Inject 1 mg into the muscle once as needed. 09/28/14   Philemon Kingdom, MD  hydrochlorothiazide (MICROZIDE) 12.5 MG capsule Take 1 capsule by mouth daily. 03/07/17 03/07/18  [provider]  Insulin Glargine (LANTUS SOLOSTAR) 100 UNIT/ML Solostar Pen Inject 18 Units into the skin daily.  09/16/14   [provider]  insulin lispro (HUMALOG) 100 UNIT/ML injection Inject 0-10 Units into the skin 3 (three) times daily before meals. + sliding scale if BG>150    [provider]  latanoprost (XALATAN) 0.005 % ophthalmic solution Place 1 drop into both eyes at bedtime.    [provider]  levocetirizine (XYZAL) 5 MG tablet Take 1 tablet by mouth daily. In evening 01/18/17 01/18/18  [provider]  olmesartan (BENICAR) 40 MG tablet Take 1 tablet (40 mg total) by mouth daily. 12/12/15   Janith Lima, MD  potassium chloride SA (K-DUR,KLOR-CON) 20 MEQ tablet Take 2 tablets (40 mEq total) by mouth daily. 12/23/16   Oswald Hillock, MD  pregabalin (LYRICA) 100 MG capsule Take 1 capsule (100 mg total) by mouth 2 (two) times daily. 12/12/15   Janith Lima, MD  pregabalin (LYRICA) 25 MG capsule Take 25 mg by mouth as directed. 03/21/20   [provider]    Family History  Problem Relation Age of Onset  . Cancer Father        Colon Cancer  . Heart disease Father   . High blood pressure Unknown      Social  History   Tobacco Use  . Smoking status: Never Smoker  . Smokeless tobacco: Never Used  Substance Use Topics  . Alcohol use: No    Comment: rare  . Drug use: No    Allergies as of 04/11/2021 - Review Complete 01/26/2021  Allergen Reaction Noted  . Lisinopril Hives 06/03/2013    Review of Systems:    All systems reviewed and negative except where noted in HPI.   Physical Exam:  There were no vitals taken for this visit. No LMP for male patient. Psych:  Alert and cooperative. Normal mood and affect. General:   Alert,  Well-developed, well-nourished, pleasant and cooperative in NAD Head:  Normocephalic and atraumatic. Eyes:  Sclera clear, no icterus.   Conjunctiva pink. Ears:  Normal auditory acuity.  Neurologic:  Alert and oriented x3;  grossly normal neurologically. Psych:  Alert and cooperative. Normal mood and affect.  Rectal exam with chaperone in the room: Externally mild swelling of prolapsed internal hemorrhoids in the left lateral and right posterior area.  Anoscopy performed no other abnormality seen in the anal canal except probably mildly thrombosed hemorrhoids in the above-noted positions.  No pain or tenderness noted.  Imaging Studies: No results found.  Assessment and Plan:   Dylan Quinn is a 85 y.o. y/o male has been referred for abnormal sensation in the perianal area appears to be swollen prolapsing hemorrhoids.  I performed anoscopic exam.  Discussed about conservative management of hemorrhoids including a high-fiber diet, patient information be provided, 25 g of fiber per day, trial of Anusol suppositories.  Video visit in 4 to 6 weeks  Follow up in 4 to 6 weeks video visit  Dr Jonathon Bellows MD,MRCP(U.K)

## 2021-04-20 ENCOUNTER — Encounter (INDEPENDENT_AMBULATORY_CARE_PROVIDER_SITE_OTHER): Payer: Self-pay

## 2021-05-15 ENCOUNTER — Encounter: Payer: Self-pay | Admitting: Gastroenterology

## 2021-05-15 ENCOUNTER — Telehealth (INDEPENDENT_AMBULATORY_CARE_PROVIDER_SITE_OTHER): Payer: Medicare Other | Admitting: Gastroenterology

## 2021-05-15 ENCOUNTER — Other Ambulatory Visit: Payer: Self-pay

## 2021-05-15 VITALS — BP 157/82 | HR 78 | Wt 194.0 lb

## 2021-05-15 DIAGNOSIS — K648 Other hemorrhoids: Secondary | ICD-10-CM

## 2021-05-15 NOTE — Progress Notes (Signed)
Jonathon Bellows MD, MRCP(U.K) [address]  [place]  [place], [place] [address]  Main: [phone]  Fax: [phone]   Primary Care Physician: Tracie Harrier, MD  Primary Gastroenterologist:  Dr. Jonathon Bellows   Chief Complaint  Patient presents with  . office visit    6 wk f/u    HPI: Dylan Quinn is a 85 y.o. male    Summary of history :   For the past few months he feels an abnormal sensation in his anal area.  Denies any bleeding.  He thinks it is hemorrhoids.  Denies any constipation.  Denies any change in his bowel habits.  No other complaints Rectal exam showed  Externally mild swelling of prolapsed internal hemorrhoids in the left lateral and right posterior area.  Anoscopy performed no other abnormality seen in the anal canal except probably mildly thrombosed hemorrhoids in the above-noted positions.  No pain or tenderness noted.   Interval history   04/11/2021-05/15/2021  Since her last visit he has started taking Metamucil and following the instructions given to him about conservative management of internal hemorrhoids and he states that all his symptoms have completely resolved and he is doing great    Current Outpatient Medications  Medication Sig Dispense Refill  . brimonidine-timolol (COMBIGAN) 0.2-0.5 % ophthalmic solution Place 1 drop into both eyes 2 (two) times daily.     . clobetasol cream (TEMOVATE) 5.46 % Apply 1 application topically 2 (two) times daily as needed (for rash).     . colchicine 0.6 MG tablet Take 2 tablets by mouth. 2 tablets at first sign of gout flare followed by 1    . Cyanocobalamin (B-12 IJ) Inject 1 mcg as directed every 30 (thirty) days.    Mariane Baumgarten Sodium (COLACE PO) Take 2 capsules by mouth as needed.    . dorzolamide (TRUSOPT) 2 % ophthalmic solution Place 1 drop into both eyes 2 (two) times daily.    Marland Kitchen glucagon (GLUCAGON EMERGENCY) 1 MG injection Inject 1 mg into the muscle once as needed. 2 each 1  .  hydrocortisone (ANUSOL-HC) 25 MG suppository Place 1 suppository (25 mg total) rectally at bedtime. 5 suppository 0  . insulin glargine (LANTUS) 100 UNIT/ML Solostar Pen Inject 18 Units into the skin daily.     . insulin lispro (HUMALOG) 100 UNIT/ML injection Inject 0-10 Units into the skin 3 (three) times daily before meals. + sliding scale if BG>150    . latanoprost (XALATAN) 0.005 % ophthalmic solution Place 1 drop into both eyes at bedtime.    Marland Kitchen olmesartan (BENICAR) 40 MG tablet Take 1 tablet (40 mg total) by mouth daily. 90 tablet 3  . potassium chloride SA (K-DUR,KLOR-CON) 20 MEQ tablet Take 2 tablets (40 mEq total) by mouth daily. 30 tablet 2  . pregabalin (LYRICA) 25 MG capsule Take 25 mg by mouth as directed.    . hydrochlorothiazide (MICROZIDE) 12.5 MG capsule Take 1 capsule by mouth daily.    Marland Kitchen levocetirizine (XYZAL) 5 MG tablet Take 1 tablet by mouth daily. In evening    . pregabalin (LYRICA) 100 MG capsule Take 1 capsule (100 mg total) by mouth 2 (two) times daily. (Patient not taking: No sig reported) 180 capsule 1   No current facility-administered medications for this visit.    Allergies as of 05/15/2021 - Review Complete 05/15/2021  Allergen Reaction Noted  . Lisinopril Hives 06/03/2013    ROS:  General: Negative for anorexia, weight loss, fever, chills, fatigue, weakness. ENT:  Negative for hoarseness, difficulty swallowing , nasal congestion. CV: Negative for chest pain, angina, palpitations, dyspnea on exertion, peripheral edema.  Respiratory: Negative for dyspnea at rest, dyspnea on exertion, cough, sputum, wheezing.  GI: See history of present illness. GU:  Negative for dysuria, hematuria, urinary incontinence, urinary frequency, nocturnal urination.  Endo: Negative for unusual weight change.    Physical Examination:   BP (!) 157/82 (BP Location: Right Arm, Patient Position: Sitting, Cuff Size: Large)   Pulse 78   Wt 194 lb (88 kg)   BMI 25.25 kg/m   General:  Well-nourished, well-developed in no acute distress.  Eyes: No icterus. Conjunctivae pink Neuro: Alert and oriented x 3.  Grossly intact. Skin: Warm and dry, no jaundice.   Psych: Alert and cooperative, normal mood and affect.   Imaging Studies: No results found.  Assessment and Plan:   Dylan Quinn is a 85 y.o. y/o male here to follow up  for abnormal sensation in the perianal area appears to be swollen prolapsing hemorrhoids.  He has responded very well to conservative management with increased fiber in his diet, adhering to perianal hygiene practices discussed at the prior visit.  Since he is having no issues I do not believe he requires any further intervention at this point of time.  I advised him to continue the conservative management for now and if the symptoms were to return we could consider banding of his hemorrhoids at the office   Dr Jonathon Bellows  MD,MRCP [facility]) Follow up in as needed

## 2021-06-01 ENCOUNTER — Other Ambulatory Visit: Payer: Self-pay

## 2021-06-01 ENCOUNTER — Ambulatory Visit (INDEPENDENT_AMBULATORY_CARE_PROVIDER_SITE_OTHER): Payer: Medicare Other | Admitting: Ophthalmology

## 2021-06-01 ENCOUNTER — Encounter (INDEPENDENT_AMBULATORY_CARE_PROVIDER_SITE_OTHER): Payer: Self-pay | Admitting: Ophthalmology

## 2021-06-01 DIAGNOSIS — E103312 Type 1 diabetes mellitus with moderate nonproliferative diabetic retinopathy with macular edema, left eye: Secondary | ICD-10-CM

## 2021-06-01 DIAGNOSIS — E103391 Type 1 diabetes mellitus with moderate nonproliferative diabetic retinopathy without macular edema, right eye: Secondary | ICD-10-CM | POA: Diagnosis not present

## 2021-06-01 NOTE — Assessment & Plan Note (Signed)
CSME OS has continued to remain stable now 4 months post antivegF, Avastin usage will observe

## 2021-06-01 NOTE — Progress Notes (Signed)
06/01/2021     CHIEF COMPLAINT Patient presents for Retina Follow Up (4 month fu OU and OCT/FP/Pt states VA OU stable since last visit. Pt denies FOL, floaters, or ocular pain OU. /Pt reports using Combigan Qday OU, Dorzolamide BID OU and Latanoprost QHS OU/A1C: 9.5/LBS: 364)   HISTORY OF PRESENT ILLNESS: Dylan Quinn is a 85 y.o. male who presents to the clinic today for:   HPI     Retina Follow Up           Diagnosis: Other   Laterality: both eyes   Onset: 4 months ago   Severity: mild   Duration: 4 weeks   Course: stable   Comments: 4 month fu OU and OCT/FP Pt states VA OU stable since last visit. Pt denies FOL, floaters, or ocular pain OU.  Pt reports using Combigan Qday OU, Dorzolamide BID OU and Latanoprost QHS OU A1C: 9.5 LBS: 364       Last edited by Kendra Opitz, COA on 06/01/2021  1:25 PM.      Referring physician: Tracie Harrier, MD [address] [facility] [place],  [place] [address]  HISTORICAL INFORMATION:   Selected notes from the MEDICAL RECORD NUMBER    Lab Results  Component Value Date   HGBA1C 10.4 10/09/2016     CURRENT MEDICATIONS: Current Outpatient Medications (Ophthalmic Drugs)  Medication Sig   brimonidine-timolol (COMBIGAN) 0.2-0.5 % ophthalmic solution Place 1 drop into both eyes 2 (two) times daily.    dorzolamide (TRUSOPT) 2 % ophthalmic solution Place 1 drop into both eyes 2 (two) times daily.   latanoprost (XALATAN) 0.005 % ophthalmic solution Place 1 drop into both eyes at bedtime.   No current facility-administered medications for this visit. (Ophthalmic Drugs)   Current Outpatient Medications (Other)  Medication Sig   clobetasol cream (TEMOVATE) 6.33 % Apply 1 application topically 2 (two) times daily as needed (for rash).    colchicine 0.6 MG tablet Take 2 tablets by mouth. 2 tablets at first sign of gout flare followed by 1   Cyanocobalamin (B-12 IJ) Inject 1 mcg as directed every 30 (thirty) days.    Docusate Sodium (COLACE PO) Take 2 capsules by mouth as needed.   glucagon (GLUCAGON EMERGENCY) 1 MG injection Inject 1 mg into the muscle once as needed.   hydrochlorothiazide (MICROZIDE) 12.5 MG capsule Take 1 capsule by mouth daily.   hydrocortisone (ANUSOL-HC) 25 MG suppository Place 1 suppository (25 mg total) rectally at bedtime.   insulin glargine (LANTUS) 100 UNIT/ML Solostar Pen Inject 18 Units into the skin daily.    insulin lispro (HUMALOG) 100 UNIT/ML injection Inject 0-10 Units into the skin 3 (three) times daily before meals. + sliding scale if BG>150   levocetirizine (XYZAL) 5 MG tablet Take 1 tablet by mouth daily. In evening   olmesartan (BENICAR) 40 MG tablet Take 1 tablet (40 mg total) by mouth daily.   potassium chloride SA (K-DUR,KLOR-CON) 20 MEQ tablet Take 2 tablets (40 mEq total) by mouth daily.   pregabalin (LYRICA) 100 MG capsule Take 1 capsule (100 mg total) by mouth 2 (two) times daily. (Patient not taking: No sig reported)   pregabalin (LYRICA) 25 MG capsule Take 25 mg by mouth as directed.   No current facility-administered medications for this visit. (Other)      REVIEW OF SYSTEMS:    ALLERGIES Allergies  Allergen Reactions   Lisinopril Hives    hives    PAST MEDICAL HISTORY Past  Medical History:  Diagnosis Date   BENIGN PROSTATIC HYPERTROPHY 07/19/2009   COLONIC POLYPS 02/14/2010   DEPRESSION 03/16/2008   DIABETES MELLITUS, TYPE I 06/30/2007   Dyslipidemia    FOLLICULITIS 12/31/8414   GLAUCOMA 07/19/2009   HEARING LOSS 02/14/2010   HYPERTENSION 05/14/2008   HYPOGONADISM, MALE 12/16/2007   Leukopenia    LUMBAR RADICULOPATHY, LEFT 02/23/2010   Other osteoporosis 12/16/2007   PERIPHERAL NEUROPATHY 06/30/2007   Prostate cancer ([place])    PROTEINURIA, MILD 02/14/2010   Psoriasis    URINARY CALCULUS 12/27/2010   Past Surgical History:  Procedure Laterality Date   APPENDECTOMY  1986   CATARACT EXTRACTION  2006   CATARACT EXTRACTION  1997   TEE WITHOUT  CARDIOVERSION N/A 12/19/2016   Procedure: TRANSESOPHAGEAL ECHOCARDIOGRAM (TEE);  Surgeon: Pixie Casino, MD;  Location: [facility] ENDOSCOPY;  Service: Cardiovascular;  Laterality: N/A;    FAMILY HISTORY Family History  Problem Relation Age of Onset   Cancer Father        Colon Cancer   Heart disease Father    High blood pressure Unknown     SOCIAL HISTORY Social History   Tobacco Use   Smoking status: Never   Smokeless tobacco: Never  Substance Use Topics   Alcohol use: No    Comment: rare   Drug use: No         OPHTHALMIC EXAM:  Base Eye Exam     Visual Acuity (ETDRS)       Right Left   Dist Cylinder 20/40 -2 20/30   Dist ph [place] NI 20/25 -2         Tonometry (Tonopen, 1:30 PM)       Right Left   Pressure 09 09         Pupils       Pupils Dark Light Shape React APD   Right PERRL 4 3 Round Slow None   Left PERRL 4 3 Round Slow None         Visual Fields       Left Right   Restrictions Partial outer superior temporal, inferior temporal, superior nasal, inferior nasal deficiencies Partial outer superior temporal, inferior temporal, superior nasal, inferior nasal deficiencies         Extraocular Movement       Right Left    Full Full         Neuro/Psych     Oriented x3: Yes   Mood/Affect: Normal         Dilation     Both eyes: 1.0% Mydriacyl, 2.5% Phenylephrine @ 1:30 PM           Slit Lamp and Fundus Exam     External Exam       Right Left   External Normal Normal         Slit Lamp Exam       Right Left   Lids/Lashes Normal Normal   Conjunctiva/Sclera White and quiet White and quiet   Cornea Clear Clear   Anterior Chamber Deep and quiet Deep and quiet   Iris Round and reactive Round and reactive   Lens Posterior chamber intraocular lens Posterior chamber intraocular lens   Anterior Vitreous Normal Normal         Fundus Exam       Right Left   Posterior Vitreous Posterior vitreous detachment Posterior vitreous  detachment   Disc  Thin rim   C/D Ratio 0.85 0.85   Macula Early age related macular  degeneration Macular thickening, Mild clinically significant macular edema, Microaneurysms, Exudates   Vessels NPDR- Moderate NPDR- Moderate   Periphery Normal Normal            IMAGING AND PROCEDURES  Imaging and Procedures for 06/01/21  OCT, Retina - OU - Both Eyes       Right Eye Quality was good. Scan locations included subfoveal. Central Foveal Thickness: 319. Progression has been stable. Findings include cystoid macular edema.   Left Eye Quality was good. Scan locations included subfoveal. Central Foveal Thickness: 273. Progression has improved. Findings include cystoid macular edema.   Notes Improved CSME OS some 4 months post injection of vitreal Avastin , will repeat simple examination next in 4 months without treatment     Color Fundus Photography Optos - OU - Both Eyes       Right Eye Progression has been stable. Disc findings include normal observations, increased cup to disc ratio. Macula : microaneurysms.   Left Eye Progression has been stable. Disc findings include normal observations, increased cup to disc ratio. Macula : microaneurysms.   Notes Moderate NPDR.  No active maculopathy             ASSESSMENT/PLAN:  Diabetic macular edema of left eye with moderate nonproliferative retinopathy associated with type 1 diabetes mellitus (HCC) CSME OS has continued to remain stable now 4 months post antivegF, Avastin usage will observe  Moderate nonproliferative diabetic retinopathy of right eye (HCC) No active CSME OD will observe     ICD-10-CM   1. Diabetic macular edema of left eye with moderate nonproliferative retinopathy associated with type 1 diabetes mellitus (HCC)  Y50.3546 OCT, Retina - OU - Both Eyes    Color Fundus Photography Optos - OU - Both Eyes    2. Moderate nonproliferative diabetic retinopathy of right eye without macular edema associated with  type 1 diabetes mellitus (HCC)  E10.3391 OCT, Retina - OU - Both Eyes    Color Fundus Photography Optos - OU - Both Eyes      1.  OU, CSME involutional and stable at this 29-month interval post most recent injection left eye and right eye has not required treatment in over a year  2.  3.  Ophthalmic Meds Ordered this visit:  No orders of the defined types were placed in this encounter.      Return in about 4 months (around 10/01/2021) for DILATE OU, OCT.  There are no Patient Instructions on file for this visit.   Explained the diagnoses, plan, and follow up with the patient and they expressed understanding.  Patient expressed understanding of the importance of proper follow up care.   Clent Demark Shamaria Kavan M.D. Diseases & Surgery of the Retina and Vitreous [facility] [place] 06/01/21     Abbreviations: M myopia (nearsighted); A astigmatism; H hyperopia (farsighted); P presbyopia; Mrx spectacle prescription;  CTL contact lenses; OD right eye; OS left eye; OU both eyes  XT exotropia; ET esotropia; PEK punctate epithelial keratitis; PEE punctate epithelial erosions; DES dry eye syndrome; MGD meibomian gland dysfunction; ATs artificial tears; PFAT's preservative free artificial tears; [place] nuclear sclerotic cataract; PSC posterior subcapsular cataract; ERM epi-retinal membrane; PVD posterior vitreous detachment; RD retinal detachment; DM diabetes mellitus; DR diabetic retinopathy; NPDR non-proliferative diabetic retinopathy; PDR proliferative diabetic retinopathy; CSME clinically significant macular edema; DME diabetic macular edema; dbh dot blot hemorrhages; CWS cotton wool spot; POAG primary open angle glaucoma; C/D cup-to-disc ratio; HVF humphrey visual field; GVF goldmann visual field;  OCT optical coherence tomography; IOP intraocular pressure; BRVO Branch retinal vein occlusion; CRVO central retinal vein occlusion; CRAO central retinal artery occlusion; BRAO branch retinal artery  occlusion; RT retinal tear; SB scleral buckle; PPV pars plana vitrectomy; VH Vitreous hemorrhage; PRP panretinal laser photocoagulation; IVK intravitreal kenalog; VMT vitreomacular traction; MH Macular hole;  NVD neovascularization of the disc; NVE neovascularization elsewhere; AREDS age related eye disease study; ARMD age related macular degeneration; POAG primary open angle glaucoma; EBMD epithelial/anterior basement membrane dystrophy; ACIOL anterior chamber intraocular lens; IOL intraocular lens; PCIOL posterior chamber intraocular lens; Phaco/IOL phacoemulsification with intraocular lens placement; [place] photorefractive keratectomy; LASIK laser assisted in situ keratomileusis; HTN hypertension; DM diabetes mellitus; COPD chronic obstructive pulmonary disease

## 2021-06-01 NOTE — Assessment & Plan Note (Signed)
No active CSME OD will observe

## 2021-08-03 ENCOUNTER — Emergency Department: Payer: Medicare Other

## 2021-08-03 ENCOUNTER — Emergency Department
Admission: EM | Admit: 2021-08-03 | Discharge: 2021-08-04 | Disposition: A | Discharge status: Home / Self Care | Payer: Medicare Other | Attending: Emergency Medicine | Admitting: Emergency Medicine

## 2021-08-03 ENCOUNTER — Other Ambulatory Visit: Payer: Self-pay

## 2021-08-03 DIAGNOSIS — R42 Dizziness and giddiness: Secondary | ICD-10-CM | POA: Diagnosis not present

## 2021-08-03 DIAGNOSIS — E1040 Type 1 diabetes mellitus with diabetic neuropathy, unspecified: Secondary | ICD-10-CM | POA: Diagnosis not present

## 2021-08-03 DIAGNOSIS — Z8546 Personal history of malignant neoplasm of prostate: Secondary | ICD-10-CM | POA: Diagnosis not present

## 2021-08-03 DIAGNOSIS — Z79899 Other long term (current) drug therapy: Secondary | ICD-10-CM | POA: Insufficient documentation

## 2021-08-03 DIAGNOSIS — Z794 Long term (current) use of insulin: Secondary | ICD-10-CM | POA: Diagnosis not present

## 2021-08-03 DIAGNOSIS — R739 Hyperglycemia, unspecified: Secondary | ICD-10-CM

## 2021-08-03 DIAGNOSIS — I10 Essential (primary) hypertension: Secondary | ICD-10-CM | POA: Insufficient documentation

## 2021-08-03 DIAGNOSIS — E1065 Type 1 diabetes mellitus with hyperglycemia: Secondary | ICD-10-CM | POA: Insufficient documentation

## 2021-08-03 LAB — BASIC METABOLIC PANEL
Anion gap: 9 (ref 5–15)
BUN: 23 mg/dL (ref 8–23)
CO2: 23 mmol/L (ref 22–32)
Calcium: 9.1 mg/dL (ref 8.9–10.3)
Chloride: 101 mmol/L (ref 98–111)
Creatinine, Ser: 1.23 mg/dL (ref 0.61–1.24)
GFR, Estimated: 58 mL/min — ABNORMAL LOW (ref >60)
Glucose, Bld: 354 mg/dL — ABNORMAL HIGH (ref 70–99)
Potassium: 5.1 mmol/L (ref 3.5–5.1)
Sodium: 133 mmol/L — ABNORMAL LOW (ref 135–145)

## 2021-08-03 LAB — CBC
HCT: 35.3 % — ABNORMAL LOW (ref 39.0–52.0)
Hemoglobin: 11.8 g/dL — ABNORMAL LOW (ref 13.0–17.0)
MCH: 30 pg (ref 26.0–34.0)
MCHC: 33.4 g/dL (ref 30.0–36.0)
MCV: 89.8 fL (ref 80.0–100.0)
Platelets: 359 10*3/uL (ref 150–400)
RBC: 3.93 MIL/uL — ABNORMAL LOW (ref 4.22–5.81)
RDW: 13.4 % (ref 11.5–15.5)
WBC: 6 10*3/uL (ref 4.0–10.5)
nRBC: 0 % (ref 0.0–0.2)

## 2021-08-03 LAB — URINALYSIS, COMPLETE (UACMP) WITH MICROSCOPIC
Bacteria, UA: NONE SEEN
Bilirubin Urine: NEGATIVE
Glucose, UA: >=500 mg/dL — AB
Hgb urine dipstick: NEGATIVE
Ketones, ur: 20 mg/dL — AB
Leukocytes,Ua: NEGATIVE
Nitrite: NEGATIVE
Protein, ur: 30 mg/dL — AB
Specific Gravity, Urine: 1.029 (ref 1.005–1.030)
Squamous Epithelial / HPF: NONE SEEN (ref 0–5)
pH: 5 (ref 5.0–8.0)

## 2021-08-03 MED ORDER — MECLIZINE HCL 25 MG PO TABS: 25.0000 mg | ORAL_TABLET | Freq: Two times a day (BID) | ORAL | 0 refills | Status: AC | PRN

## 2021-08-03 MED ORDER — MECLIZINE HCL 25 MG PO TABS
25.0000 mg | ORAL_TABLET | Freq: Once | ORAL | Status: AC
  Administered 2021-08-03: 25 mg via ORAL
  Filled 2021-08-03: qty 1

## 2021-08-03 MED ORDER — SODIUM CHLORIDE 0.9 % IV BOLUS
1000.0000 mL | Freq: Once | INTRAVENOUS | Status: AC
  Administered 2021-08-03: 1000 mL via INTRAVENOUS

## 2021-08-03 NOTE — ED Provider Notes (Signed)
[facility] Emergency Department Provider Note   ____________________________________________   I have reviewed the triage vital signs and the nursing notes.   HISTORY  Chief Complaint Dizziness   History limited by: Not Limited   HPI Dylan SHANNAHAN is a 85 y.o. male who presents to the emergency department today because of concern for dizziness. He describes it as the sense of the room spinning around. He feels it when he stands up but does not notice it when he is sitting or lying down. It has been present for the past few days. He denies any associated headache, chest pain or palpitations. The patient says that he has had similar episodes in the past although they have never lasted this long. Additionally he says he has been having a tough time controlling his blood sugar recently. Did get bilateral steroid injections in his shoulders recently.    Records reviewed. Per medical record review patient has a history of DM  Past Medical History:  Diagnosis Date   BENIGN PROSTATIC HYPERTROPHY 07/19/2009   COLONIC POLYPS 02/14/2010   DEPRESSION 03/16/2008   DIABETES MELLITUS, TYPE I 06/30/2007   Dyslipidemia    FOLLICULITIS 0000000   GLAUCOMA 07/19/2009   HEARING LOSS 02/14/2010   HYPERTENSION 05/14/2008   HYPOGONADISM, MALE 12/16/2007   Leukopenia    LUMBAR RADICULOPATHY, LEFT 02/23/2010   Other osteoporosis 12/16/2007   PERIPHERAL NEUROPATHY 06/30/2007   Prostate cancer ([place])    PROTEINURIA, MILD 02/14/2010   Psoriasis    URINARY CALCULUS 12/27/2010    Patient Active Problem List   Diagnosis Date Noted   Diabetic macular edema of left eye with moderate nonproliferative retinopathy associated with type 1 diabetes mellitus ([place]) 04/26/2020   Retinal hemorrhage, bilateral 04/25/2020   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   Retinal exudates and deposits 04/25/2020   Moderate nonproliferative diabetic retinopathy of right eye ([place]) 04/25/2020   Chronic  pain syndrome 06/02/2018   Staphylococcus aureus bacteremia 10/30/2017   Lumbar radiculopathy 10/17/2017   Lumbar degenerative disc disease 10/17/2017   Foraminal stenosis of lumbar region 10/17/2017   Vitamin D deficiency 09/25/2017   Type 1 diabetes mellitus with diabetic neuropathy ([place]) 02/23/2017   Rash and nonspecific skin eruption 12/31/2016   Influenza with pneumonia    Diabetic ketoacidosis without coma associated with type 1 diabetes mellitus ([place]) 12/11/2016   Nausea & vomiting 12/11/2016   DKA, type 1 ([place]) 12/11/2016   Primary open angle glaucoma of both eyes, moderate stage 05/17/2016   Right knee pain 04/18/2016   Primary osteoarthritis of right knee 04/18/2016   COPD (chronic obstructive pulmonary disease) ([place]) 12/22/2015   Type 1 diabetes mellitus with nephropathy ([place]) 12/12/2015   URI (upper respiratory infection) 12/09/2015   Normocytic anemia 05/04/2013   Insomnia 07/28/2012   HEARING LOSS 02/14/2010   PROTEINURIA, MILD 02/14/2010   Glaucoma 07/19/2009   Prostate cancer ([place]) 07/19/2009   Hypertension associated with diabetes ([place]) 05/14/2008   Other osteoporosis 12/16/2007   Diabetes mellitus type 1 with neurological manifestations ([place]) 06/30/2007   PERIPHERAL NEUROPATHY 06/30/2007    Past Surgical History:  Procedure Laterality Date   APPENDECTOMY  1986   CATARACT EXTRACTION  2006   CATARACT EXTRACTION  1997   TEE WITHOUT CARDIOVERSION N/A 12/19/2016   Procedure: TRANSESOPHAGEAL ECHOCARDIOGRAM (TEE);  Surgeon: Pixie Casino, MD;  Location: [facility] ENDOSCOPY;  Service: Cardiovascular;  Laterality: N/A;    Prior to Admission medications   Medication Sig Start Date End Date Taking? Authorizing  Provider  brimonidine-timolol (COMBIGAN) 0.2-0.5 % ophthalmic solution Place 1 drop into both eyes 2 (two) times daily.     [provider]  clobetasol cream (TEMOVATE) [id number] % Apply 1 application topically 2 (two) times daily as needed (for rash).      [provider]  colchicine 0.6 MG tablet Take 2 tablets by mouth. 2 tablets at first sign of gout flare followed by 1 03/07/17   [provider]  Cyanocobalamin (B-12 IJ) Inject 1 mcg as directed every 30 (thirty) days.    [provider]  Docusate Sodium (COLACE PO) Take 2 capsules by mouth as needed.    [provider]  dorzolamide (TRUSOPT) 2 % ophthalmic solution Place 1 drop into both eyes 2 (two) times daily.    [provider]  glucagon (GLUCAGON EMERGENCY) 1 MG injection Inject 1 mg into the muscle once as needed. 09/28/14   Philemon Kingdom, MD  hydrochlorothiazide (MICROZIDE) 12.5 MG capsule Take 1 capsule by mouth daily. 03/07/17 03/07/18  [provider]  hydrocortisone (ANUSOL-HC) 25 MG suppository Place 1 suppository (25 mg total) rectally at bedtime. 04/11/21   Jonathon Bellows, MD  insulin glargine (LANTUS) 100 UNIT/ML Solostar Pen Inject 18 Units into the skin daily.  09/16/14   [provider]  insulin lispro (HUMALOG) 100 UNIT/ML injection Inject 0-10 Units into the skin 3 (three) times daily before meals. + sliding scale if BG>150    [provider]  latanoprost (XALATAN) 0.005 % ophthalmic solution Place 1 drop into both eyes at bedtime.    [provider]  levocetirizine (XYZAL) 5 MG tablet Take 1 tablet by mouth daily. In evening 01/18/17 01/18/18  [provider]  olmesartan (BENICAR) 40 MG tablet Take 1 tablet (40 mg total) by mouth daily. 12/12/15   Janith Lima, MD  potassium chloride SA (K-DUR,KLOR-CON) 20 MEQ tablet Take 2 tablets (40 mEq total) by mouth daily. 12/23/16   Oswald Hillock, MD  pregabalin (LYRICA) 100 MG capsule Take 1 capsule (100 mg total) by mouth 2 (two) times daily. Patient not taking: No sig reported 12/12/15   Janith Lima, MD  pregabalin (LYRICA) 25 MG capsule Take 25 mg by mouth as directed. 03/21/20   [provider]    Allergies Lisinopril  Family  History  Problem Relation Age of Onset   Cancer Father        Colon Cancer   Heart disease Father    High blood pressure Unknown     Social History Social History   Tobacco Use   Smoking status: Never   Smokeless tobacco: Never  Substance Use Topics   Alcohol use: No    Comment: rare   Drug use: No    Review of Systems Constitutional: No fever/chills Eyes: No visual changes. ENT: No sore throat. Cardiovascular: Denies chest pain. Respiratory: Denies shortness of breath. Gastrointestinal: No abdominal pain.  No nausea, no vomiting.  No diarrhea.   Genitourinary: Negative for dysuria. Musculoskeletal: Negative for back pain. Skin: Negative for rash. Neurological: Positive for dizziness.  ____________________________________________   PHYSICAL EXAM:  VITAL SIGNS: ED Triage Vitals  Enc Vitals Group     BP 08/03/21 1626 132/76     Pulse Rate 08/03/21 1626 86     Resp 08/03/21 1626 18     Temp 08/03/21 1626 98 F (36.7 C)     Temp Source 08/03/21 1626 Oral     SpO2 08/03/21 1626 100 %  Weight --      Height --      Head Circumference --      Peak Flow --      Pain Score 08/03/21 1623 5   Constitutional: Alert and oriented.  Eyes: Conjunctivae are normal.  ENT      Head: Normocephalic and atraumatic.      Nose: No congestion/rhinnorhea.      Mouth/Throat: Mucous membranes are moist.      Neck: No stridor. Hematological/Lymphatic/Immunilogical: No cervical lymphadenopathy. Cardiovascular: Normal rate, regular rhythm.  No murmurs, rubs, or gallops.  Respiratory: Normal respiratory effort without tachypnea nor retractions. Breath sounds are clear and equal bilaterally. No wheezes/rales/rhonchi. Gastrointestinal: Soft and non tender. No rebound. No guarding.  Genitourinary: Deferred Musculoskeletal: Normal range of motion in all extremities. No lower extremity edema. Neurologic:  Normal speech and language. EOMI. PERRL. Strength 5/5 in upper and lower  extremities. No gross focal neurologic deficits are appreciated.  Skin:  Skin is warm, dry and intact. No rash noted. Psychiatric: Mood and affect are normal. Speech and behavior are normal. Patient exhibits appropriate insight and judgment.  ____________________________________________    LABS (pertinent positives/negatives)  UA clear, ketones 20, protein 30, 0-5 rbc and wbc BMP na 133, glu 354 otherwise wnl CBC wbc 6.0, hgb 11.8, plt 359  ____________________________________________   EKG  I, Nance Pear, attending physician, personally viewed and interpreted this EKG  EKG Time: 1636 Rate: 84 Rhythm: normal sinus rhythm Axis: normal Intervals: qtc 418 QRS: narrow, q waves v1 ST changes: no st elevation Impression: abnormal ekg  ____________________________________________    RADIOLOGY  CXR No acute abnormality  ____________________________________________   PROCEDURES  Procedures  ____________________________________________   INITIAL IMPRESSION / ASSESSMENT AND PLAN / ED COURSE  Pertinent labs & imaging results that were available during my care of the patient were reviewed by me and considered in my medical decision making (see chart for details).   Patient presented to the emergency department today because of concerns for dizziness which she described as the room spinning around.  Patient states he has had similar episodes in the past but never quite this severe.  Blood work was notable for hyperglycemia.  After my initial evaluation apparently the wife called and stated she is also been coughing recently so chest x-ray was ordered.  This did not show any acute abnormality.  Patient was given IV fluids as well as meclizine.  Upon standing afterwards he did feel like the dizziness had improved significantly.  He did still have some balance.  He does have a Rollator at home although he states he does not always require it.  At this time I did discuss with  the patient further evaluation versus discharge.  Did discuss possibility of stroke although I do have lower concern given that he has had similar episodes in the past and he did feel improvement after meclizine and fluids and additionally the symptoms only occur when standing.  Patient stated this time he felt comfortable and safe going home.  I think this is reasonable at this time.  Will give patient prescription for meclizine and ENT follow-up.  ___________________________________________   FINAL CLINICAL IMPRESSION(S) / ED DIAGNOSES  Final diagnoses:  Dizziness  Hyperglycemia     Note: This dictation was prepared with Dragon dictation. Any transcriptional errors that result from this process are unintentional     Nance Pear, MD 08/03/21 [phone]

## 2021-08-03 NOTE — ED Triage Notes (Addendum)
Pt comes via [facility] with c/o dizziness for months. Pt states it has gotten worse. Pt unable to get up and out of bed. Pt is from home.   Pt has had some irregular readings of BS per EMS.  CBG-409. Pt given 500 of fluids.  Pt has hx of anemia. Wife reports more frequent urination and foul odor.

## 2021-08-03 NOTE — Discharge Instructions (Addendum)
Please seek medical attention for any high fevers, chest pain, shortness of breath, change in behavior, persistent vomiting, bloody stool or any other new or concerning symptoms.  

## 2021-08-03 NOTE — ED Notes (Signed)
Pt updated on wait times. Denies any further needs at this time.

## 2021-08-21 ENCOUNTER — Ambulatory Visit
Admission: RE | Admit: 2021-08-21 | Discharge: 2021-08-21 | Disposition: A | Discharge status: Home / Self Care | Payer: Medicare Other | Source: Ambulatory Visit | Attending: Family Medicine | Admitting: Family Medicine

## 2021-08-21 ENCOUNTER — Other Ambulatory Visit: Payer: Self-pay | Admitting: Family Medicine

## 2021-08-21 ENCOUNTER — Other Ambulatory Visit: Payer: Self-pay

## 2021-08-21 DIAGNOSIS — R42 Dizziness and giddiness: Secondary | ICD-10-CM | POA: Diagnosis not present

## 2021-08-21 DIAGNOSIS — R1310 Dysphagia, unspecified: Secondary | ICD-10-CM

## 2021-09-12 DIAGNOSIS — I5022 Chronic systolic (congestive) heart failure: Secondary | ICD-10-CM | POA: Insufficient documentation

## 2021-10-05 ENCOUNTER — Encounter (INDEPENDENT_AMBULATORY_CARE_PROVIDER_SITE_OTHER): Payer: Medicare Other | Admitting: Ophthalmology

## 2022-02-06 ENCOUNTER — Emergency Department: Payer: Medicare Other

## 2022-02-06 ENCOUNTER — Emergency Department
Admission: EM | Admit: 2022-02-06 | Discharge: 2022-02-06 | Disposition: A | Discharge status: Home / Self Care | Payer: Medicare Other | Attending: Emergency Medicine | Admitting: Emergency Medicine

## 2022-02-06 ENCOUNTER — Other Ambulatory Visit: Payer: Self-pay

## 2022-02-06 DIAGNOSIS — R001 Bradycardia, unspecified: Secondary | ICD-10-CM

## 2022-02-06 DIAGNOSIS — E1122 Type 2 diabetes mellitus with diabetic chronic kidney disease: Secondary | ICD-10-CM | POA: Diagnosis not present

## 2022-02-06 DIAGNOSIS — Y92238 Other place in hospital as the place of occurrence of the external cause: Secondary | ICD-10-CM | POA: Insufficient documentation

## 2022-02-06 DIAGNOSIS — I130 Hypertensive heart and chronic kidney disease with heart failure and stage 1 through stage 4 chronic kidney disease, or unspecified chronic kidney disease: Secondary | ICD-10-CM | POA: Diagnosis not present

## 2022-02-06 DIAGNOSIS — I509 Heart failure, unspecified: Secondary | ICD-10-CM | POA: Insufficient documentation

## 2022-02-06 DIAGNOSIS — W19XXXA Unspecified fall, initial encounter: Secondary | ICD-10-CM

## 2022-02-06 DIAGNOSIS — N189 Chronic kidney disease, unspecified: Secondary | ICD-10-CM | POA: Diagnosis not present

## 2022-02-06 DIAGNOSIS — Z8546 Personal history of malignant neoplasm of prostate: Secondary | ICD-10-CM | POA: Insufficient documentation

## 2022-02-06 DIAGNOSIS — W01198A Fall on same level from slipping, tripping and stumbling with subsequent striking against other object, initial encounter: Secondary | ICD-10-CM | POA: Insufficient documentation

## 2022-02-06 DIAGNOSIS — S0993XA Unspecified injury of face, initial encounter: Secondary | ICD-10-CM | POA: Diagnosis present

## 2022-02-06 DIAGNOSIS — S0083XA Contusion of other part of head, initial encounter: Secondary | ICD-10-CM

## 2022-02-06 NOTE — ED Triage Notes (Signed)
Pt states he was at [facility] today getting routine labs and while in the BR went to turn and lost his balance causing him to fall hitting his head on the toilet, denies LOC, not   on blood thinners, pt c/o tenderness to the left forehead and left knee. Pt is a/ox4 on arrival

## 2022-02-06 NOTE — ED Provider Triage Note (Cosign Needed)
Emergency Medicine Provider Triage Evaluation Note  Dylan Quinn. , a 86 y.o. male  was evaluated in triage.  Pt complains of mechanical fall at home.  Patient presents from [facility] after he presented for routine labs.  While going to the bathroom, patient apparently turned quickly, losing his balance.  He fell hitting his head on the toilet.  No reported LOC, nausea, vomiting, dizziness.  Patient is not on any blood thinners.  Patient also reports some left knee pain.  He denies any other injury at this time.  Review of Systems  Positive: Head contusion, knee contusion Negative: LOC, N/V  Physical Exam  BP (!) 150/85 (BP Location: Left Arm)    Pulse (!) 52    Temp 97.9 F (36.6 C) (Oral)    Resp 18    SpO2 97%  Gen:   Awake, no distress   Resp:  Normal effort CTA MSK:   Moves extremities without difficulty  Other:  CVS: RRR  Medical Decision Making  Medically screening exam initiated at 1:19 PM.  Appropriate orders placed.  [employer]. was informed that the remainder of the evaluation will be completed by another provider, this initial triage assessment does not replace that evaluation, and the importance of remaining in the ED until their evaluation is complete.  Geriatric patient with a ED evaluation of injury sustained following mechanical fall at [name].  He presents to the ED for evaluation of a minor head injury and knee contusion.   [name], PA-C 02/06/22 1321

## 2022-02-06 NOTE — ED Notes (Signed)
See triage note presents from [facility] s/p fall  lost balance  having pian to left knee and head  states he hit his head on toilet

## 2022-02-06 NOTE — ED Notes (Signed)
Pt refusing EKG. MD Creig Hines notified at this time

## 2022-02-06 NOTE — ED Provider Notes (Addendum)
[facility] Provider Note    Event Date/Time   First MD Initiated Contact with Patient 02/06/22 1408     (approximate)   History   Fall   HPI  Dylan Quinn. is a 86 y.o. male with a past medical history of HTN, CHF, DM, HTN, and prostate cancer typically ambulating with a walker at baseline who presents for evaluation after a fall that occurred at the clinic.  Patient was getting some routine labs drawn when he was in the restroom and turned quickly stating the just tripped and lost his balance and fell hitting his head.  He denies being on any blood thinners and did not have any LOC.  States he currently has no headache or any other symptoms.  He did not feel lightheaded dizzy or have any other symptoms before falling.  He has been able to use his walker and ambulate without difficulty since then.  He denies any other recent chest pain, cough, shortness of breath, dizziness, vomiting, diarrhea, rash or any other acute sick symptoms.  No other acute concerns at this time.     Physical Exam  Triage Vital Signs: ED Triage Vitals  Enc Vitals Group     BP 02/06/22 1242 (!) 150/85     Pulse Rate 02/06/22 1242 (!) 52     Resp 02/06/22 1242 18     Temp 02/06/22 1242 97.9 F (36.6 C)     Temp Source 02/06/22 1242 Oral     SpO2 02/06/22 1242 97 %     Weight 02/06/22 1404 194 lb 0.1 oz (88 kg)     Height 02/06/22 1404 6' 1.5" (1.867 m)     Head Circumference --      Peak Flow --      Pain Score 02/06/22 1243 3     Pain Loc --      Pain Edu? --      Excl. in [place]? --     Most recent vital signs: Vitals:   02/06/22 1242  BP: (!) 150/85  Pulse: (!) 52  Resp: 18  Temp: 97.9 F (36.6 C)  SpO2: 97%    General: Awake, no distress.  CV:  Good peripheral perfusion.  2+ radial pulses. Resp:  Normal effort.  Clear bilaterally. Abd:  No distention.  Soft. Other:   CN II-XII grossly intact  No tenderness/deformities over the C/T/L spine  No focal  TTP over b/l shoulders, elbows, wrists, hips, knees, ankles  2+ b/l radial and PD pulses   Small abrasion over the left forehead and over the left knee.  No significant tenderness effusion deformity or decreased range of motion of the left knee..  No other obvious trauma to face, scalp ,head, neck or torso    ED Results / Procedures / Treatments  Labs (all labs ordered are listed, but only abnormal results are displayed) Labs Reviewed - No data to display   EKG    RADIOLOGY CT head interpreted by myself shows no evidence of skull fracture, interval hemorrhage or other acute intracranial process.  Also viewed radiology interpretation and agree the findings of some evidence of chronic ischemic microvascular white matter disease bilateral TMJ arthropathy as well as mild chronic ethmoid sinusitis without any other clear acute process.  CT C-spine interpreted by myself shows no acute fracture or traumatic listhesis.  Also reviewed radiology's interpretation and agree with her findings of no acute process.  Plain film of the left knee interpreted by  myself shows no acute fracture or dislocation.  I also reviewed radiology to rotation and agree the findings of no effusion and some degenerative changes.  No other acute process noted by radiology.  PROCEDURES:  Critical Care performed: No  Procedures    MEDICATIONS ORDERED IN ED: Medications - No data to display   IMPRESSION / MDM / [place] / ED COURSE  I reviewed the triage vital signs and the nursing notes.                              Differential diagnosis includes, but is not limited to mechanical fall causing superficial abrasion to the forehead, underlying skull fracture or intracranial hemorrhage or occult C-spine injury.  Suspect likely contusion to left knee.  X-ray obtained shows no fracture dislocation.  Patient denies any other recent sick symptoms I have a low suspicion for acute infectious process,  significant metabolic derangement or ACS.    Given patient's age head head and C-spine CTs obtained.  CT C-spine interpreted by myself shows no acute fracture or traumatic listhesis.  Also reviewed radiology's interpretation and agree with her findings of no acute process.   Low suspicion for other significant occult distal or significant trauma.  Patient is refusing EKG I explained my concerns that his heart rate is slow and that this could be causing him to lose his balance more easily if he had a heart block or any evidence of a heart attack.  He states he understands this and that the for step to diagnose and this is the EKG but he refuses to get any additional diagnostic tests including EKG or blood work.  I think he has capacity to refuse this and states he understands the risks of undiagnosed heart attack and abnormal rhythm that could predispose to death and disability and another fall.  Discharged AMA.  Advised to follow-up with PCP.   FINAL CLINICAL IMPRESSION(S) / ED DIAGNOSES   Final diagnoses:  Fall, initial encounter  Bradycardia  Contusion of forehead, initial encounter     Rx / DC Orders   ED Discharge Orders     None        Note:  This document was prepared using Dragon voice recognition software and may include unintentional dictation errors.   Lucrezia Starch, MD 02/06/22 1525    Lucrezia Starch, MD 02/06/22 1525

## 2022-02-06 NOTE — ED Notes (Signed)
First Nurse Note:  Pt to ED from [facility]. Pt fell and hit his head in the bathroom at [facility]. Pt is in NAD.

## 2022-02-20 ENCOUNTER — Other Ambulatory Visit: Payer: Self-pay

## 2022-02-20 ENCOUNTER — Ambulatory Visit (INDEPENDENT_AMBULATORY_CARE_PROVIDER_SITE_OTHER): Payer: Medicare Other | Admitting: Ophthalmology

## 2022-02-20 ENCOUNTER — Encounter (INDEPENDENT_AMBULATORY_CARE_PROVIDER_SITE_OTHER): Payer: Self-pay | Admitting: Ophthalmology

## 2022-02-20 DIAGNOSIS — E103312 Type 1 diabetes mellitus with moderate nonproliferative diabetic retinopathy with macular edema, left eye: Secondary | ICD-10-CM

## 2022-02-20 DIAGNOSIS — E103311 Type 1 diabetes mellitus with moderate nonproliferative diabetic retinopathy with macular edema, right eye: Secondary | ICD-10-CM

## 2022-02-20 DIAGNOSIS — H401132 Primary open-angle glaucoma, bilateral, moderate stage: Secondary | ICD-10-CM | POA: Diagnosis not present

## 2022-02-20 MED ORDER — BEVACIZUMAB 2.5 MG/0.1ML IZ SOSY
2.5000 mg | PREFILLED_SYRINGE | INTRAVITREAL | Status: AC | PRN
  Administered 2022-02-20: 2.5 mg via INTRAVITREAL

## 2022-02-20 NOTE — Assessment & Plan Note (Signed)
OS, center involved [facility] vastly improved now 1 year post most recent injection OS as compared to April 2021.  We will need to continue observe OS

## 2022-02-20 NOTE — Assessment & Plan Note (Signed)
OU, under the care of Dr.  [name] eye care

## 2022-02-20 NOTE — Assessment & Plan Note (Addendum)
The nature of diabetic macular edema was discussed with the patient. Treatment options were outlined including medical therapy, laser & vitrectomy. The use of injectable medications reviewed, including Avastin, Lucentis, and Eylea. Periodic injections into the eye are likely to resolve diabetic macular edema (swelling in the center of vision). Initially, injections are delivered are delivered every 4-6 weeks, and the interval extended as the condition improves. On average, 8-9 injections the first year, and 5 in year 2. Improvement in the condition most often improves on medical therapy. Occasional use of focal laser is also recommended for residual macular edema (swelling). Excellent control of blood glucose and blood pressure are encouraged under the care of a primary physician or endocrinologist. Similarly, attempts to maintain serum cholesterol, low density lipoproteins, and high-density lipoproteins in a favorable range were recommended.   Center involved [facility] OD, commence with intravitreal Avastin OD today

## 2022-02-20 NOTE — Progress Notes (Signed)
02/20/2022     CHIEF COMPLAINT Patient presents for  Chief Complaint  Patient presents with   Diabetic Retinopathy with Macular Edema      HISTORY OF PRESENT ILLNESS: Dylan Quinn. is a 86 y.o. male who presents to the clinic today for:   HPI   8 MOS FU OU OCT. (MISSED October APPT). Pt states "2 weeks ago today I fell at a clinic, I hit my head on the left side. A day or two later I have started to have some pain in the left eye that Dr. Zadie Rhine has been checking on and I am concerned about that." Pt states the pain is "soreness that feels like it is in the eyeball." Pt states vision is about the same in both eyes. Last edited by Laurin Coder on 02/20/2022  3:01 PM.      Referring physician: Tracie Harrier, MD [address] [place],  [place] [address]  HISTORICAL INFORMATION:   Selected notes from the MEDICAL RECORD NUMBER    Lab Results  Component Value Date   HGBA1C 10.4 10/09/2016     CURRENT MEDICATIONS: Current Outpatient Medications (Ophthalmic Drugs)  Medication Sig   brimonidine-timolol (COMBIGAN) 0.2-0.5 % ophthalmic solution Place 1 drop into both eyes 2 (two) times daily.    dorzolamide (TRUSOPT) 2 % ophthalmic solution Place 1 drop into both eyes 2 (two) times daily.   latanoprost (XALATAN) 0.005 % ophthalmic solution Place 1 drop into both eyes at bedtime.   No current facility-administered medications for this visit. (Ophthalmic Drugs)   Current Outpatient Medications (Other)  Medication Sig   clobetasol cream (TEMOVATE) 5.10 % Apply 1 application topically 2 (two) times daily as needed (for rash).    colchicine 0.6 MG tablet Take 2 tablets by mouth. 2 tablets at first sign of gout flare followed by 1   Cyanocobalamin (B-12 IJ) Inject 1 mcg as directed every 30 (thirty) days.   Docusate Sodium (COLACE PO) Take 2 capsules by mouth as needed.   glucagon (GLUCAGON EMERGENCY) 1 MG injection Inject 1 mg into the  muscle once as needed.   hydrochlorothiazide (MICROZIDE) 12.5 MG capsule Take 1 capsule by mouth daily.   hydrocortisone (ANUSOL-HC) 25 MG suppository Place 1 suppository (25 mg total) rectally at bedtime.   insulin glargine (LANTUS) 100 UNIT/ML Solostar Pen Inject 18 Units into the skin daily.    insulin lispro (HUMALOG) 100 UNIT/ML injection Inject 0-10 Units into the skin 3 (three) times daily before meals. + sliding scale if BG>150   levocetirizine (XYZAL) 5 MG tablet Take 1 tablet by mouth daily. In evening   meclizine (ANTIVERT) 25 MG tablet Take 1 tablet (25 mg total) by mouth 2 (two) times daily as needed for dizziness.   olmesartan (BENICAR) 40 MG tablet Take 1 tablet (40 mg total) by mouth daily.   potassium chloride SA (K-DUR,KLOR-CON) 20 MEQ tablet Take 2 tablets (40 mEq total) by mouth daily.   pregabalin (LYRICA) 100 MG capsule Take 1 capsule (100 mg total) by mouth 2 (two) times daily. (Patient not taking: No sig reported)   pregabalin (LYRICA) 25 MG capsule Take 25 mg by mouth as directed.   No current facility-administered medications for this visit. (Other)      REVIEW OF SYSTEMS:    ALLERGIES Allergies  Allergen Reactions   Lisinopril Hives    hives    PAST MEDICAL HISTORY Past Medical History:  Diagnosis Date   BENIGN PROSTATIC  HYPERTROPHY 07/19/2009   COLONIC POLYPS 02/14/2010   DEPRESSION 03/16/2008   DIABETES MELLITUS, TYPE I 06/30/2007   Dyslipidemia    FOLLICULITIS 12/25/2480   GLAUCOMA 07/19/2009   HEARING LOSS 02/14/2010   HYPERTENSION 05/14/2008   HYPOGONADISM, MALE 12/16/2007   Leukopenia    LUMBAR RADICULOPATHY, LEFT 02/23/2010   Other osteoporosis 12/16/2007   PERIPHERAL NEUROPATHY 06/30/2007   Prostate cancer ([place])    PROTEINURIA, MILD 02/14/2010   Psoriasis    URINARY CALCULUS 12/27/2010   Past Surgical History:  Procedure Laterality Date   APPENDECTOMY  1986   CATARACT EXTRACTION  2006   CATARACT EXTRACTION  1997   TEE WITHOUT CARDIOVERSION N/A  12/19/2016   Procedure: TRANSESOPHAGEAL ECHOCARDIOGRAM (TEE);  Surgeon: Pixie Casino, MD;  Location: [facility] ENDOSCOPY;  Service: Cardiovascular;  Laterality: N/A;    FAMILY HISTORY Family History  Problem Relation Age of Onset   Cancer Father        Colon Cancer   Heart disease Father    High blood pressure Unknown     SOCIAL HISTORY Social History   Tobacco Use   Smoking status: Never   Smokeless tobacco: Never  Substance Use Topics   Alcohol use: No    Comment: rare   Drug use: No         OPHTHALMIC EXAM:  Base Eye Exam     Visual Acuity (ETDRS)       Right Left   Dist [place] 20/40 -2 20/25 -2   Dist ph  20/30          Tonometry (Tonopen, 3:06 PM)       Right Left   Pressure 8 10         Pupils       Dark Light Shape APD   Right 4 3 slightly irregular None   Left 4 3  None         Extraocular Movement       Right Left    Full Full         Neuro/Psych     Oriented x3: Yes   Mood/Affect: Normal         Dilation     Both eyes: 1.0% Mydriacyl, 2.5% Phenylephrine @ 3:06 PM           Slit Lamp and Fundus Exam     External Exam       Right Left   External Normal Normal         Slit Lamp Exam       Right Left   Lids/Lashes Normal Normal   Conjunctiva/Sclera White and quiet White and quiet   Cornea Clear Clear   Anterior Chamber Deep and quiet Deep and quiet   Iris Round and reactive Round and reactive   Lens Posterior chamber intraocular lens Posterior chamber intraocular lens   Anterior Vitreous Normal Normal         Fundus Exam       Right Left   Posterior Vitreous Posterior vitreous detachment Posterior vitreous detachment   Disc Normal Thin rim   C/D Ratio 0.85 0.85   Macula Early age related macular degeneration, Microaneurysms Macular thickening, Mild clinically significant macular edema, Microaneurysms, Exudates   Vessels NPDR- Moderate NPDR- Moderate   Periphery Normal Normal            IMAGING  AND PROCEDURES  Imaging and Procedures for 02/20/22  OCT, Retina - OU - Both Eyes  Right Eye Quality was good. Scan locations included subfoveal. Central Foveal Thickness: 379. Progression has been stable. Findings include cystoid macular edema, abnormal foveal contour.   Left Eye Quality was good. Scan locations included subfoveal. Central Foveal Thickness: 288. Progression has improved. Findings include cystoid macular edema.   Notes Improved CSME OS some 12 months post injection of vitreal Avastin , stable OS, 1 year since last treatment  OD, new center involved CSME present     Intravitreal Injection, Pharmacologic Agent - OD - Right Eye       Time Out 02/20/2022. 3:52 PM. Confirmed correct patient, procedure, site, and patient consented.   Anesthesia Topical anesthesia was used. Anesthetic medications included Lidocaine 4%.   Procedure Preparation included 5% betadine to ocular surface, 10% betadine to eyelids. A 30 gauge needle was used.   Injection: 2.5 mg bevacizumab 2.5 MG/0.1ML   Route: Intravitreal, Site: Right Eye   NDC: [phone], Lot: [id number]   Post-op Post injection exam found visual acuity of at least counting fingers. The patient tolerated the procedure well. There were no complications. The patient received written and verbal post procedure care education. Post injection medications included gentamicin.              ASSESSMENT/PLAN:  Diabetic macular edema of right eye with moderate nonproliferative retinopathy associated with type 1 diabetes mellitus ([place])  The nature of diabetic macular edema was discussed with the patient. Treatment options were outlined including medical therapy, laser & vitrectomy. The use of injectable medications reviewed, including Avastin, Lucentis, and Eylea. Periodic injections into the eye are likely to resolve diabetic macular edema (swelling in the center of vision). Initially, injections are delivered are  delivered every 4-6 weeks, and the interval extended as the condition improves. On average, 8-9 injections the first year, and 5 in year 2. Improvement in the condition most often improves on medical therapy. Occasional use of focal laser is also recommended for residual macular edema (swelling). Excellent control of blood glucose and blood pressure are encouraged under the care of a primary physician or endocrinologist. Similarly, attempts to maintain serum cholesterol, low density lipoproteins, and high-density lipoproteins in a favorable range were recommended.   Center involved CSME OD, commence with intravitreal Avastin OD today  Diabetic macular edema of left eye with moderate nonproliferative retinopathy associated with type 1 diabetes mellitus (HCC) OS, center involved CSME vastly improved now 1 year post most recent injection OS as compared to April 2021.  We will need to continue observe OS  Primary open angle glaucoma of both eyes, moderate stage OU, under the care of Dr.  [name] eye care     ICD-10-CM   1. 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 Diabetic macular edema of left eye with moderate nonproliferative retinopathy associated with type 1 diabetes mellitus (HCC)  A19.3790 CANCELED: OCT, Retina - OU - Both Eyes    3. Primary open angle glaucoma of both eyes, moderate stage  H40.1132       1.  OD, vastly improved overall yet still now with new center involved CSME need to commence with therapy today follow-up next in 6 to 8 weeks  2.  OS doing very well, now 1 year post most recent injection for  center involved CSME, will continue to monitor and observe  3.  Ophthalmic Meds Ordered this visit:  Meds ordered this encounter  Medications    bevacizumab (AVASTIN) SOSY 2.5 mg       Return in about 6 weeks (around 04/03/2022) for dilate, OD, AVASTIN OCT.  There are no Patient Instructions on file for this visit.   Explained the diagnoses, plan, and follow up with the patient and they expressed understanding.  Patient expressed understanding of the importance of proper follow up care.   Clent Demark Nunzio Banet M.D. Diseases & Surgery of the Retina and Vitreous Retina & Diabetic [place] 02/20/22     Abbreviations: M myopia (nearsighted); A astigmatism; H hyperopia (farsighted); P presbyopia; Mrx spectacle prescription;  CTL contact lenses; OD right eye; OS left eye; OU both eyes  XT exotropia; ET esotropia; PEK punctate epithelial keratitis; PEE punctate epithelial erosions; DES dry eye syndrome; MGD meibomian gland dysfunction; ATs artificial tears; PFAT's preservative free artificial tears; [place] nuclear sclerotic cataract; PSC posterior subcapsular cataract; ERM epi-retinal membrane; PVD posterior vitreous detachment; RD retinal detachment; DM diabetes mellitus; DR diabetic retinopathy; NPDR non-proliferative diabetic retinopathy; PDR proliferative diabetic retinopathy; CSME clinically significant macular edema; DME diabetic macular edema; dbh dot blot hemorrhages; CWS cotton wool spot; POAG primary open angle glaucoma; C/D cup-to-disc ratio; HVF humphrey visual field; GVF goldmann visual field; OCT optical coherence tomography; IOP intraocular pressure; BRVO Branch retinal vein occlusion; CRVO central retinal vein occlusion; CRAO central retinal artery occlusion; BRAO branch retinal artery occlusion; RT retinal tear; SB scleral buckle; PPV pars plana vitrectomy; VH Vitreous hemorrhage; PRP panretinal laser photocoagulation; IVK intravitreal kenalog; VMT vitreomacular traction; MH Macular hole;  NVD neovascularization of the disc; NVE neovascularization elsewhere; AREDS age related eye disease study; ARMD age related macular degeneration;  POAG primary open angle glaucoma; EBMD epithelial/anterior basement membrane dystrophy; ACIOL anterior chamber intraocular lens; IOL intraocular lens; PCIOL posterior chamber intraocular lens; Phaco/IOL phacoemulsification with intraocular lens placement; [place] photorefractive keratectomy; LASIK laser assisted in situ keratomileusis; HTN hypertension; DM diabetes mellitus; COPD chronic obstructive pulmonary disease

## 2022-04-02 ENCOUNTER — Encounter (INDEPENDENT_AMBULATORY_CARE_PROVIDER_SITE_OTHER): Payer: Self-pay

## 2022-04-03 ENCOUNTER — Encounter (INDEPENDENT_AMBULATORY_CARE_PROVIDER_SITE_OTHER): Payer: Medicare Other | Admitting: Ophthalmology

## 2022-04-10 ENCOUNTER — Ambulatory Visit (INDEPENDENT_AMBULATORY_CARE_PROVIDER_SITE_OTHER): Payer: Medicare Other | Admitting: Ophthalmology

## 2022-04-10 ENCOUNTER — Encounter (INDEPENDENT_AMBULATORY_CARE_PROVIDER_SITE_OTHER): Payer: Self-pay | Admitting: Ophthalmology

## 2022-04-10 DIAGNOSIS — E103311 Type 1 diabetes mellitus with moderate nonproliferative diabetic retinopathy with macular edema, right eye: Secondary | ICD-10-CM

## 2022-04-10 MED ORDER — BEVACIZUMAB 2.5 MG/0.1ML IZ SOSY
2.5000 mg | PREFILLED_SYRINGE | INTRAVITREAL | Status: AC | PRN
  Administered 2022-04-10: 2.5 mg via INTRAVITREAL

## 2022-04-10 NOTE — Assessment & Plan Note (Signed)
Moderate NPDR with CSME.  Center involved CSME has improved at 7 weeks post most recent Avastin injection we will repeat injection today to maintain ?

## 2022-04-10 NOTE — Progress Notes (Signed)
? ? ?04/10/2022 ? ?  ? ?CHIEF COMPLAINT ?Patient presents for  ?Chief Complaint  ?Patient presents with  ? Diabetic Retinopathy with Macular Edema  ? ? ? ? ?HISTORY OF PRESENT ILLNESS: ?Dylan Lina. is a 86 y.o. male who presents to the clinic today for:  ? ?HPI   ?6 weeks follow up dilate OD, Avastin OCT. ?Patient uses Combigan QD OU, Dorzolamide BID OU, and states Dr. Lucianne Lei replaced latanoprost with another eye drop. The eye drops was replaced "a couple months ago," per patient. ?Dr. Lucianne Lei noticed something in my right eye a couple weeks ago, she said it looked like an eye stroke. ?Patient states "everything seems hazy, when I read on my iphone, I hold it close so that hasn't been a problem. The environment seems smoky." ?Patient is not allergic to anything we use for injections ?LBS: 153 at 9:09 this morning, per patient. ?Last edited by Laurin Coder on 04/10/2022  3:17 PM.  ?  ? ? ?Referring physician: ?Lisabeth Pick, MD ?[place] ?[place],  [place] [address] ? ?HISTORICAL INFORMATION:  ? ?Selected notes from the [facility] ?  ? ?Lab Results  ?Component Value Date  ? HGBA1C 10.4 10/09/2016  ?  ? ?CURRENT MEDICATIONS: ?Current Outpatient Medications (Ophthalmic Drugs)  ?Medication Sig  ? brimonidine-timolol (COMBIGAN) 0.2-0.5 % ophthalmic solution Place 1 drop into both eyes 2 (two) times daily.   ? dorzolamide (TRUSOPT) 2 % ophthalmic solution Place 1 drop into both eyes 2 (two) times daily.  ? latanoprost (XALATAN) 0.005 % ophthalmic solution Place 1 drop into both eyes at bedtime.  ? ?No current facility-administered medications for this visit. (Ophthalmic Drugs)  ? ?Current Outpatient Medications (Other)  ?Medication Sig  ? clobetasol cream (TEMOVATE) 2.75 % Apply 1 application topically 2 (two) times daily as needed (for rash).   ? colchicine 0.6 MG tablet Take 2 tablets by mouth. 2 tablets at first sign of gout flare followed by 1  ? Cyanocobalamin (B-12 IJ) Inject 1 mcg as directed  every 30 (thirty) days.  ? Docusate Sodium (COLACE PO) Take 2 capsules by mouth as needed.  ? glucagon (GLUCAGON EMERGENCY) 1 MG injection Inject 1 mg into the muscle once as needed.  ? hydrochlorothiazide (MICROZIDE) 12.5 MG capsule Take 1 capsule by mouth daily.  ? hydrocortisone (ANUSOL-HC) 25 MG suppository Place 1 suppository (25 mg total) rectally at bedtime.  ? insulin glargine (LANTUS) 100 UNIT/ML Solostar Pen Inject 18 Units into the skin daily.   ? insulin lispro (HUMALOG) 100 UNIT/ML injection Inject 0-10 Units into the skin 3 (three) times daily before meals. + sliding scale if BG>150  ? levocetirizine (XYZAL) 5 MG tablet Take 1 tablet by mouth daily. In evening  ? meclizine (ANTIVERT) 25 MG tablet Take 1 tablet (25 mg total) by mouth 2 (two) times daily as needed for dizziness.  ? olmesartan (BENICAR) 40 MG tablet Take 1 tablet (40 mg total) by mouth daily.  ? potassium chloride SA (K-DUR,KLOR-CON) 20 MEQ tablet Take 2 tablets (40 mEq total) by mouth daily.  ? pregabalin (LYRICA) 100 MG capsule Take 1 capsule (100 mg total) by mouth 2 (two) times daily. (Patient not taking: No sig reported)  ? pregabalin (LYRICA) 25 MG capsule Take 25 mg by mouth as directed.  ? ?No current facility-administered medications for this visit. (Other)  ? ? ? ? ?REVIEW OF SYSTEMS: ?ROS   ?Positive for: Neurological ?Last edited by Hurman Horn, MD on 04/10/2022  3:36 PM.  ?  ? ? ? ?ALLERGIES ?Allergies  ?Allergen Reactions  ? Lisinopril Hives  ?  hives  ? ? ?PAST MEDICAL HISTORY ?Past Medical History:  ?Diagnosis Date  ? BENIGN PROSTATIC HYPERTROPHY 07/19/2009  ? COLONIC POLYPS 02/14/2010  ? DEPRESSION 03/16/2008  ? DIABETES MELLITUS, TYPE I 06/30/2007  ? Dyslipidemia   ? FOLLICULITIS 07/24/1913  ? GLAUCOMA 07/19/2009  ? HEARING LOSS 02/14/2010  ? HYPERTENSION 05/14/2008  ? HYPOGONADISM, MALE 12/16/2007  ? Leukopenia   ? LUMBAR RADICULOPATHY, LEFT 02/23/2010  ? Other osteoporosis 12/16/2007  ? PERIPHERAL NEUROPATHY 06/30/2007  ? Prostate  cancer ([place])   ? PROTEINURIA, MILD 02/14/2010  ? Psoriasis   ? URINARY CALCULUS 12/27/2010  ? ?Past Surgical History:  ?Procedure Laterality Date  ? APPENDECTOMY  1986  ? CATARACT EXTRACTION  2006  ? CATARACT EXTRACTION  1997  ? TEE WITHOUT CARDIOVERSION N/A 12/19/2016  ? Procedure: TRANSESOPHAGEAL ECHOCARDIOGRAM (TEE);  Surgeon: Pixie Casino, MD;  Location: [place];  Service: Cardiovascular;  Laterality: N/A;  ? ? ?FAMILY HISTORY ?Family History  ?Problem Relation Age of Onset  ? Cancer Father   ?     Colon Cancer  ? Heart disease Father   ? High blood pressure Unknown   ? ? ?SOCIAL HISTORY ?Social History  ? ?Tobacco Use  ? Smoking status: Never  ? Smokeless tobacco: Never  ?Substance Use Topics  ? Alcohol use: No  ?  Comment: rare  ? Drug use: No  ? ?  ? ?  ? ?OPHTHALMIC EXAM: ? ?Base Eye Exam   ? ? Visual Acuity (ETDRS)   ? ?   Right Left  ? Dist [place] 20/25 -2 20/20 -2  ? ?  ?  ? ? Tonometry (Tonopen, 3:17 PM)   ? ?   Right Left  ? Pressure 10 12  ? ?  ?  ? ? Pupils   ? ?   Pupils Shape APD  ? Right PERRL Irregular None  ? Left PERRL  None  ? ?  ?  ? ? Extraocular Movement   ? ?   Right Left  ?  Full Full  ? ?  ?  ? ? Neuro/Psych   ? ? Oriented x3: Yes  ? Mood/Affect: Normal  ? ?  ?  ? ? Dilation   ? ? Right eye: 1.0% Mydriacyl, 2.5% Phenylephrine @ 3:17 PM  ? ?  ?  ? ?  ? ?Slit Lamp and Fundus Exam   ? ? External Exam   ? ?   Right Left  ? External Normal Normal  ? ?  ?  ? ? Slit Lamp Exam   ? ?   Right Left  ? Lids/Lashes Normal Normal  ? Conjunctiva/Sclera White and quiet White and quiet  ? Cornea Clear Clear  ? Anterior Chamber Deep and quiet Deep and quiet  ? Iris Round and reactive Round and reactive  ? Lens Posterior chamber intraocular lens Posterior chamber intraocular lens  ? Anterior Vitreous Normal Normal  ? ?  ?  ? ? Fundus Exam   ? ?   Right Left  ? Posterior Vitreous Posterior vitreous detachment   ? Disc Normal   ? C/D Ratio 0.85   ? Macula Early age related macular degeneration,  Microaneurysms, Macular thickening, Cystoid macular edema   ? Vessels NPDR- Moderate   ? Periphery Normal   ? ?  ?  ? ?  ? ? ?IMAGING AND PROCEDURES  ?  Imaging and Procedures for 04/10/22 ? ?OCT, Retina - OU - Both Eyes   ? ?   ?Right Eye ?Quality was good. Scan locations included subfoveal. Central Foveal Thickness: 364. Progression has been stable. Findings include cystoid macular edema, abnormal foveal contour.  ? ?Left Eye ?Quality was good. Scan locations included subfoveal. Central Foveal Thickness: 289. Progression has improved. Findings include cystoid macular edema.  ? ?Notes ?Improved CSME OS some 12 months post injection of vitreal Avastin , stable OS,  ? ? ?OD, new center involved CSME present recently, treated with Avastin some 7 weeks previous now sooner involving the CSME has improved. ? ?  ? ?Intravitreal Injection, Pharmacologic Agent - OD - Right Eye   ? ?   ?Time Out ?04/10/2022. 3:49 PM. Confirmed correct patient, procedure, site, and patient consented.  ? ?Anesthesia ?Topical anesthesia was used. Anesthetic medications included Lidocaine 4%.  ? ?Procedure ?Preparation included 5% betadine to ocular surface, 10% betadine to eyelids. A 30 gauge needle was used.  ? ?Injection: ?2.5 mg bevacizumab 2.5 MG/0.1ML ?  Route: Intravitreal, Site: Right Eye ?  [place]: 50093-818-29, Lot: [id number]  ? ?Post-op ?Post injection exam found visual acuity of at least counting fingers. The patient tolerated the procedure well. There were no complications. The patient received written and verbal post procedure care education. Post injection medications included gentamicin.  ? ?  ? ? ?  ?  ? ?  ?ASSESSMENT/PLAN: ? 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  Center involved CSME has improved at 7 weeks post most recent Avastin injection we will repeat injection today to maintain  ? ?  ICD-10-CM   ?1. Diabetic macular  edema of right eye with moderate nonproliferative retinopathy associated with type 1 diabetes mellitus (HCC)  E10.3311 OCT, Retina - OU - Both Eyes  ?  Intravitreal Injection, Pharmacologic Agent - OD - Right Eye

## 2022-05-14 ENCOUNTER — Other Ambulatory Visit (HOSPITAL_COMMUNITY): Payer: Self-pay

## 2022-05-29 ENCOUNTER — Encounter (INDEPENDENT_AMBULATORY_CARE_PROVIDER_SITE_OTHER): Payer: Medicare Other | Admitting: Ophthalmology

## 2022-06-13 ENCOUNTER — Ambulatory Visit (INDEPENDENT_AMBULATORY_CARE_PROVIDER_SITE_OTHER): Payer: Medicare Other | Admitting: Ophthalmology

## 2022-06-13 ENCOUNTER — Encounter (INDEPENDENT_AMBULATORY_CARE_PROVIDER_SITE_OTHER): Payer: Self-pay | Admitting: Ophthalmology

## 2022-06-13 DIAGNOSIS — E103311 Type 1 diabetes mellitus with moderate nonproliferative diabetic retinopathy with macular edema, right eye: Secondary | ICD-10-CM | POA: Diagnosis not present

## 2022-06-13 DIAGNOSIS — H401132 Primary open-angle glaucoma, bilateral, moderate stage: Secondary | ICD-10-CM | POA: Diagnosis not present

## 2022-06-13 DIAGNOSIS — E103312 Type 1 diabetes mellitus with moderate nonproliferative diabetic retinopathy with macular edema, left eye: Secondary | ICD-10-CM

## 2022-06-13 MED ORDER — BEVACIZUMAB 2.5 MG/0.1ML IZ SOSY
2.5000 mg | PREFILLED_SYRINGE | INTRAVITREAL | Status: AC | PRN
  Administered 2022-06-13: 2.5 mg via INTRAVITREAL

## 2022-06-13 NOTE — Assessment & Plan Note (Signed)
OS stable over time, not active today

## 2022-06-13 NOTE — Progress Notes (Signed)
06/13/2022     CHIEF COMPLAINT Patient presents for  Chief Complaint  Patient presents with   Diabetic Retinopathy with Macular Edema      HISTORY OF PRESENT ILLNESS: Gatlyn Lipari. is a 86 y.o. male who presents to the clinic today for:   HPI   9 weeks for DILATE OD, AVASTIN OCT. Pt stated vision has been stable since last visit.  Last edited by Silvestre Moment on 06/13/2022  1:44 PM.      Referring physician: Tracie Harrier, MD [address] [facility] [place],  [place] [address]  HISTORICAL INFORMATION:   Selected notes from the MEDICAL RECORD NUMBER    Lab Results  Component Value Date   HGBA1C 10.4 10/09/2016     CURRENT MEDICATIONS: Current Outpatient Medications (Ophthalmic Drugs)  Medication Sig   brimonidine-timolol (COMBIGAN) 0.2-0.5 % ophthalmic solution Place 1 drop into both eyes 2 (two) times daily.    dorzolamide (TRUSOPT) 2 % ophthalmic solution Place 1 drop into both eyes 2 (two) times daily.   latanoprost (XALATAN) 0.005 % ophthalmic solution Place 1 drop into both eyes at bedtime.   No current facility-administered medications for this visit. (Ophthalmic Drugs)   Current Outpatient Medications (Other)  Medication Sig   clobetasol cream (TEMOVATE) 0.34 % Apply 1 application topically 2 (two) times daily as needed (for rash).    colchicine 0.6 MG tablet Take 2 tablets by mouth. 2 tablets at first sign of gout flare followed by 1   Cyanocobalamin (B-12 IJ) Inject 1 mcg as directed every 30 (thirty) days.   Docusate Sodium (COLACE PO) Take 2 capsules by mouth as needed.   glucagon (GLUCAGON EMERGENCY) 1 MG injection Inject 1 mg into the muscle once as needed.   hydrochlorothiazide (MICROZIDE) 12.5 MG capsule Take 1 capsule by mouth daily.   hydrocortisone (ANUSOL-HC) 25 MG suppository Place 1 suppository (25 mg total) rectally at bedtime.   insulin glargine (LANTUS) 100 UNIT/ML Solostar Pen Inject 18 Units into the skin daily.     insulin lispro (HUMALOG) 100 UNIT/ML injection Inject 0-10 Units into the skin 3 (three) times daily before meals. + sliding scale if BG>150   levocetirizine (XYZAL) 5 MG tablet Take 1 tablet by mouth daily. In evening   meclizine (ANTIVERT) 25 MG tablet Take 1 tablet (25 mg total) by mouth 2 (two) times daily as needed for dizziness.   olmesartan (BENICAR) 40 MG tablet Take 1 tablet (40 mg total) by mouth daily.   potassium chloride SA (K-DUR,KLOR-CON) 20 MEQ tablet Take 2 tablets (40 mEq total) by mouth daily.   pregabalin (LYRICA) 100 MG capsule Take 1 capsule (100 mg total) by mouth 2 (two) times daily. (Patient not taking: No sig reported)   pregabalin (LYRICA) 25 MG capsule Take 25 mg by mouth as directed.   No current facility-administered medications for this visit. (Other)      REVIEW OF SYSTEMS: ROS   Negative for: Constitutional, Gastrointestinal, Neurological, Skin, Genitourinary, Musculoskeletal, HENT, Endocrine, Cardiovascular, Eyes, Respiratory, Psychiatric, Allergic/Imm, Heme/Lymph Last edited by Silvestre Moment on 06/13/2022  1:44 PM.       ALLERGIES Allergies  Allergen Reactions   Lisinopril Hives    hives    PAST MEDICAL HISTORY Past Medical History:  Diagnosis Date   BENIGN PROSTATIC HYPERTROPHY 07/19/2009   COLONIC POLYPS 02/14/2010   DEPRESSION 03/16/2008   DIABETES MELLITUS, TYPE I 06/30/2007   Dyslipidemia    FOLLICULITIS 06/26/2594   GLAUCOMA 07/19/2009  HEARING LOSS 02/14/2010   HYPERTENSION 05/14/2008   HYPOGONADISM, MALE 12/16/2007   Leukopenia    LUMBAR RADICULOPATHY, LEFT 02/23/2010   Other osteoporosis 12/16/2007   PERIPHERAL NEUROPATHY 06/30/2007   Prostate cancer ([place])    PROTEINURIA, MILD 02/14/2010   Psoriasis    URINARY CALCULUS 12/27/2010   Past Surgical History:  Procedure Laterality Date   APPENDECTOMY  1986   CATARACT EXTRACTION  2006   CATARACT EXTRACTION  1997   TEE WITHOUT CARDIOVERSION N/A 12/19/2016   Procedure: TRANSESOPHAGEAL  ECHOCARDIOGRAM (TEE);  Surgeon: Pixie Casino, MD;  Location: [facility];  Service: Cardiovascular;  Laterality: N/A;    FAMILY HISTORY Family History  Problem Relation Age of Onset   Cancer Father        Colon Cancer   Heart disease Father    High blood pressure Unknown     SOCIAL HISTORY Social History   Tobacco Use   Smoking status: Never   Smokeless tobacco: Never  Substance Use Topics   Alcohol use: No    Comment: rare   Drug use: No         OPHTHALMIC EXAM:  Base Eye Exam     Visual Acuity (ETDRS)       Right Left   Dist [place] 20/30 -1 +2 20/30         Tonometry (Tonopen, 1:51 PM)       Right Left   Pressure 9 8         Pupils       Pupils APD   Right PERRL None   Left PERRL None         Visual Fields       Left Right   Restrictions Partial outer superior temporal, inferior temporal, superior nasal, inferior nasal deficiencies Partial outer superior temporal, inferior temporal, superior nasal, inferior nasal deficiencies         Extraocular Movement       Right Left    Full Full         Neuro/Psych     Oriented x3: Yes   Mood/Affect: Normal         Dilation     Right eye: 2.5% Phenylephrine, 1.0% Mydriacyl @ 1:51 PM           Slit Lamp and Fundus Exam     External Exam       Right Left   External Normal Normal         Slit Lamp Exam       Right Left   Lids/Lashes Normal Normal   Conjunctiva/Sclera White and quiet White and quiet   Cornea Clear Clear   Anterior Chamber Deep and quiet Deep and quiet   Iris Round and reactive Round and reactive   Lens Posterior chamber intraocular lens Posterior chamber intraocular lens   Anterior Vitreous Normal Normal         Fundus Exam       Right Left   Posterior Vitreous Posterior vitreous detachment Posterior vitreous detachment   Disc Normal Normal   C/D Ratio 0.85 0.85   Macula Early age related macular degeneration, Microaneurysms, Macular thickening,  Cystoid macular edema Microaneurysms   Vessels NPDR- Moderate NPDR- Moderate   Periphery Normal Normal            IMAGING AND PROCEDURES  Imaging and Procedures for 06/13/22  OCT, Retina - OU - Both Eyes       Right Eye Quality was good. Scan locations  included subfoveal. Central Foveal Thickness: 386. Progression has worsened. Findings include abnormal foveal contour, cystoid macular edema.   Left Eye Quality was good. Scan locations included subfoveal. Central Foveal Thickness: 295. Progression has improved. Findings include cystoid macular edema.   Notes Improved CSME OS some 15 months post injection of vitreal Avastin , stable OS,    OD,  center involved CSME present recently, treated with Avastin some 9 weeks previous center involving the CSME has worsened slightly     Intravitreal Injection, Pharmacologic Agent - OD - Right Eye       Time Out 06/13/2022. 2:21 PM. Confirmed correct patient, procedure, site, and patient consented.   Anesthesia Topical anesthesia was used. Anesthetic medications included Lidocaine 4%.   Procedure Preparation included 5% betadine to ocular surface, 10% betadine to eyelids. A 30 gauge needle was used.   Injection: 2.5 mg bevacizumab 2.5 MG/0.1ML   Route: Intravitreal, Site: Right Eye   NDC: [id number], Lot: [id number], Expiration date: 07/22/2022   Post-op Post injection exam found visual acuity of at least counting fingers. The patient tolerated the procedure well. There were no complications. The patient received written and verbal post procedure care education. Post injection medications included gentamicin.              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 Good blood glucose, blood pressure, and serum lipid control was recommended as well as avoidance of smoking and maintenance of normal weight.  Close follow up with PCP encouraged.   The nature of diabetic macular edema was discussed with the patient. Treatment options were outlined including medical therapy, laser & vitrectomy. The use of injectable medications reviewed, including Avastin, Lucentis, and Eylea. Periodic injections into the eye are likely to resolve diabetic macular edema (swelling in the center of vision). Initially, injections are delivered are delivered every 4-6 weeks, and the interval extended as the condition improves. On average, 8-9 injections the first year, and 5 in year 2. Improvement in the condition most often improves on medical therapy. Occasional use of focal laser is also recommended for residual macular edema (swelling). Excellent control of blood glucose and blood pressure are encouraged under the care of a primary physician or endocrinologist. Similarly, attempts to maintain serum cholesterol, low density lipoproteins, and high-density lipoproteins in a favorable range were recommended.   Diabetic macular edema of left eye with moderate nonproliferative retinopathy associated with type 1 diabetes mellitus (HCC) OS stable over time, not active today  Primary open angle glaucoma of both eyes, moderate stage Stable over time.      ICD-10-CM   1. 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 Diabetic macular edema of left eye with moderate nonproliferative retinopathy associated with type 1 diabetes mellitus ([place])  U93.2355     3. Primary open angle glaucoma of both eyes, moderate stage  H40.1132       1.  OU with moderate NPDR,  sooner involved CSME OD slightly worse in 9-week follow-up interval today.  Will need repeat injection Avastin  OD to maintain  2.  [place] interval examination OD next to 8 weeks.  3.  Ophthalmic Meds Ordered this visit:  Meds ordered this encounter  Medications   bevacizumab (AVASTIN) SOSY 2.5 mg       Return in about 8 weeks (around 08/08/2022) for dilate, OD, AVASTIN OCT.  There are no Patient Instructions on file for this visit.   Explained the diagnoses, plan, and follow up with the patient and they expressed understanding.  Patient expressed understanding of the importance of proper follow up care.   Clent Demark Hetal Proano M.D. Diseases & Surgery of the Retina and Vitreous [facility] [place] 06/13/22     Abbreviations: M myopia (nearsighted); A astigmatism; H hyperopia (farsighted); P presbyopia; Mrx spectacle prescription;  CTL contact lenses; OD right eye; OS left eye; OU both eyes  XT exotropia; ET esotropia; PEK punctate epithelial keratitis; PEE punctate epithelial erosions; DES dry eye syndrome; MGD meibomian gland dysfunction; ATs artificial tears; PFAT's preservative free artificial tears; [place] nuclear sclerotic cataract; PSC posterior subcapsular cataract; ERM epi-retinal membrane; PVD posterior vitreous detachment; RD retinal detachment; DM diabetes mellitus; DR diabetic retinopathy; NPDR non-proliferative diabetic retinopathy; PDR proliferative diabetic retinopathy; CSME clinically significant macular edema; DME diabetic macular edema; dbh dot blot hemorrhages; CWS cotton wool spot; POAG primary open angle glaucoma; C/D cup-to-disc ratio; HVF humphrey visual field; GVF goldmann visual field; OCT optical coherence tomography; IOP intraocular pressure; BRVO Branch retinal vein occlusion; CRVO central retinal vein occlusion; CRAO central retinal artery occlusion; BRAO branch retinal artery occlusion; RT retinal tear; SB scleral buckle; PPV pars plana vitrectomy; VH Vitreous  hemorrhage; PRP panretinal laser photocoagulation; IVK intravitreal kenalog; VMT vitreomacular traction; MH Macular hole;  NVD neovascularization of the disc; NVE neovascularization elsewhere; AREDS age related eye disease study; ARMD age related macular degeneration; POAG primary open angle glaucoma; EBMD epithelial/anterior basement membrane dystrophy; ACIOL anterior chamber intraocular lens; IOL intraocular lens; PCIOL posterior chamber intraocular lens; Phaco/IOL phacoemulsification with intraocular lens placement; [place] photorefractive keratectomy; LASIK laser assisted in situ keratomileusis; HTN hypertension; DM diabetes mellitus; COPD chronic obstructive pulmonary disease

## 2022-06-13 NOTE — Assessment & Plan Note (Signed)
The nature of moderate nonproliferative diabetic retinopathy was discussed with the patient as well as the need for more frequent follow up to judge for progression. Good blood glucose, blood pressure, and serum lipid control was recommended as well as avoidance of smoking and maintenance of normal weight.  Close follow up with PCP encouraged.   The nature of diabetic macular edema was discussed with the patient. Treatment options were outlined including medical therapy, laser & vitrectomy. The use of injectable medications reviewed, including Avastin, Lucentis, and Eylea. Periodic injections into the eye are likely to resolve diabetic macular edema (swelling in the center of vision). Initially, injections are delivered are delivered every 4-6 weeks, and the interval extended as the condition improves. On average, 8-9 injections the first year, and 5 in year 2. Improvement in the condition most often improves on medical therapy. Occasional use of focal laser is also recommended for residual macular edema (swelling). Excellent control of blood glucose and blood pressure are encouraged under the care of a primary physician or endocrinologist. Similarly, attempts to maintain serum cholesterol, low density lipoproteins, and high-density lipoproteins in a favorable range were recommended.

## 2022-06-13 NOTE — Assessment & Plan Note (Signed)
Stable over time. 

## 2022-08-08 ENCOUNTER — Ambulatory Visit (INDEPENDENT_AMBULATORY_CARE_PROVIDER_SITE_OTHER): Payer: Medicare Other | Admitting: Ophthalmology

## 2022-08-20 ENCOUNTER — Ambulatory Visit (INDEPENDENT_AMBULATORY_CARE_PROVIDER_SITE_OTHER): Payer: Medicare Other | Admitting: Ophthalmology

## 2022-08-20 DIAGNOSIS — E103311 Type 1 diabetes mellitus with moderate nonproliferative diabetic retinopathy with macular edema, right eye: Secondary | ICD-10-CM | POA: Diagnosis not present

## 2022-08-20 MED ORDER — BEVACIZUMAB CHEMO INJECTION 1.25MG/0.05ML SYRINGE FOR KALEIDOSCOPE
1.2500 mg | INTRAVITREAL | Status: AC | PRN
  Administered 2022-08-20: 1.25 mg via INTRAVITREAL

## 2022-08-20 NOTE — Assessment & Plan Note (Addendum)
The nature of diabetic macular edema was discussed with the patient. Treatment options were outlined including medical therapy, laser & vitrectomy. The use of injectable medications reviewed, including Avastin, Lucentis, and Eylea. Periodic injections into the eye are likely to resolve diabetic macular edema (swelling in the center of vision). Initially, injections are delivered are delivered every 4-6 weeks, and the interval extended as the condition improves. On average, 8-9 injections the first year, and 5 in year 2. Improvement in the condition most often improves on medical therapy. Occasional use of focal laser is also recommended for residual macular edema (swelling). Excellent control of blood glucose and blood pressure are encouraged under the care of a primary physician or endocrinologist. Similarly, attempts to maintain serum cholesterol, low density lipoproteins, and high-density lipoproteins in a favorable range were recommended.   Center involved CME CSME at 9 weeks today repeat injection today to maintain and extend interval examination 3 months dilate OU

## 2022-08-20 NOTE — Progress Notes (Signed)
08/20/2022     CHIEF COMPLAINT Patient presents for  Chief Complaint  Patient presents with   Diabetic Retinopathy with Macular Edema      HISTORY OF PRESENT ILLNESS: Dylan Quinn. is a 86 y.o. male who presents to the clinic today for:   HPI   9 weeks dilate od avastin oct Pt states his vision has been stable Pt denies any new floaters or FOL  Pt states his vision has been a little hazy Last edited by Hurman Horn, MD on 08/20/2022  2:36 PM.      Referring physician: Tracie Harrier, MD [address] [facility] [place],  [place] [address]  HISTORICAL INFORMATION:   Selected notes from the [facility]    Lab Results  Component Value Date   HGBA1C 10.4 10/09/2016     CURRENT MEDICATIONS: Current Outpatient Medications (Ophthalmic Drugs)  Medication Sig   brimonidine-timolol (COMBIGAN) 0.2-0.5 % ophthalmic solution Place 1 drop into both eyes 2 (two) times daily.    dorzolamide (TRUSOPT) 2 % ophthalmic solution Place 1 drop into both eyes 2 (two) times daily.   latanoprost (XALATAN) 0.005 % ophthalmic solution Place 1 drop into both eyes at bedtime.   No current facility-administered medications for this visit. (Ophthalmic Drugs)   Current Outpatient Medications (Other)  Medication Sig   clobetasol cream (TEMOVATE) 9.56 % Apply 1 application topically 2 (two) times daily as needed (for rash).    colchicine 0.6 MG tablet Take 2 tablets by mouth. 2 tablets at first sign of gout flare followed by 1   Cyanocobalamin (B-12 IJ) Inject 1 mcg as directed every 30 (thirty) days.   Docusate Sodium (COLACE PO) Take 2 capsules by mouth as needed.   glucagon (GLUCAGON EMERGENCY) 1 MG injection Inject 1 mg into the muscle once as needed.   hydrochlorothiazide (MICROZIDE) 12.5 MG capsule Take 1 capsule by mouth daily.   hydrocortisone (ANUSOL-HC) 25 MG suppository Place 1 suppository (25 mg total) rectally at bedtime.   insulin glargine  (LANTUS) 100 UNIT/ML Solostar Pen Inject 18 Units into the skin daily.    insulin lispro (HUMALOG) 100 UNIT/ML injection Inject 0-10 Units into the skin 3 (three) times daily before meals. + sliding scale if BG>150   levocetirizine (XYZAL) 5 MG tablet Take 1 tablet by mouth daily. In evening   meclizine (ANTIVERT) 25 MG tablet Take 1 tablet (25 mg total) by mouth 2 (two) times daily as needed for dizziness.   olmesartan (BENICAR) 40 MG tablet Take 1 tablet (40 mg total) by mouth daily.   potassium chloride SA (K-DUR,KLOR-CON) 20 MEQ tablet Take 2 tablets (40 mEq total) by mouth daily.   pregabalin (LYRICA) 100 MG capsule Take 1 capsule (100 mg total) by mouth 2 (two) times daily. (Patient not taking: No sig reported)   pregabalin (LYRICA) 25 MG capsule Take 25 mg by mouth as directed.   No current facility-administered medications for this visit. (Other)      REVIEW OF SYSTEMS: ROS   Negative for: Constitutional, Gastrointestinal, Neurological, Skin, Genitourinary, Musculoskeletal, HENT, Endocrine, Cardiovascular, Eyes, Respiratory, Psychiatric, Allergic/Imm, Heme/Lymph Last edited by Orene Desanctis D, CMA on 08/20/2022  1:42 PM.       ALLERGIES Allergies  Allergen Reactions   Lisinopril Hives    hives    PAST MEDICAL HISTORY Past Medical History:  Diagnosis Date   BENIGN PROSTATIC HYPERTROPHY 07/19/2009   COLONIC POLYPS 02/14/2010   DEPRESSION 03/16/2008   DIABETES MELLITUS,  TYPE I 06/30/2007   Dyslipidemia    FOLLICULITIS 07/26/1516   GLAUCOMA 07/19/2009   HEARING LOSS 02/14/2010   HYPERTENSION 05/14/2008   HYPOGONADISM, MALE 12/16/2007   Leukopenia    LUMBAR RADICULOPATHY, LEFT 02/23/2010   Other osteoporosis 12/16/2007   PERIPHERAL NEUROPATHY 06/30/2007   Prostate cancer ([place])    PROTEINURIA, MILD 02/14/2010   Psoriasis    URINARY CALCULUS 12/27/2010   Past Surgical History:  Procedure Laterality Date   APPENDECTOMY  1986   CATARACT EXTRACTION  2006   CATARACT EXTRACTION   1997   TEE WITHOUT CARDIOVERSION N/A 12/19/2016   Procedure: TRANSESOPHAGEAL ECHOCARDIOGRAM (TEE);  Surgeon: Pixie Casino, MD;  Location: [facility] ENDOSCOPY;  Service: Cardiovascular;  Laterality: N/A;    FAMILY HISTORY Family History  Problem Relation Age of Onset   Cancer Father        Colon Cancer   Heart disease Father    High blood pressure Unknown     SOCIAL HISTORY Social History   Tobacco Use   Smoking status: Never   Smokeless tobacco: Never  Substance Use Topics   Alcohol use: No    Comment: rare   Drug use: No         OPHTHALMIC EXAM:  Base Eye Exam     Visual Acuity (ETDRS)       Right Left   Dist cc 20/25 +1 20/25 -1    Correction: Glasses         Tonometry (Tonopen, 1:50 PM)       Right Left   Pressure 8 11         Pupils       Pupils APD   Right PERRL None   Left PERRL None         Visual Fields       Left Right    Full Full         Extraocular Movement       Right Left    Full, Ortho Full, Ortho         Neuro/Psych     Oriented x3: Yes   Mood/Affect: Normal         Dilation     Right eye: 2.5% Phenylephrine, 1.0% Mydriacyl @ 1:45 PM           Slit Lamp and Fundus Exam     External Exam       Right Left   External Normal Normal         Slit Lamp Exam       Right Left   Lids/Lashes Normal Normal   Conjunctiva/Sclera White and quiet White and quiet   Cornea Clear Clear   Anterior Chamber Deep and quiet Deep and quiet   Iris Round and reactive Round and reactive   Lens Posterior chamber intraocular lens Posterior chamber intraocular lens   Anterior Vitreous Normal Normal         Fundus Exam       Right Left   Posterior Vitreous Posterior vitreous detachment    Disc Normal    C/D Ratio 0.85    Macula Early age related macular degeneration, Microaneurysms, Macular thickening, Cystoid macular edema    Vessels NPDR- Moderate    Periphery Normal             IMAGING AND PROCEDURES   Imaging and Procedures for 08/20/22  OCT, Retina - OU - Both Eyes       Right Eye Quality was good. Scan  locations included subfoveal. Central Foveal Thickness: 370. Progression has worsened. Findings include abnormal foveal contour, cystoid macular edema.   Left Eye Quality was good. Scan locations included subfoveal. Central Foveal Thickness: 302. Progression has improved. Findings include cystoid macular edema.   Notes Improved CSME OS some 17 months post injection of vitreal Avastin , stable OS,    OD,  center involved CSME present recently, treated with Avastin some 9 weeks previous center involving the CSME has worsened slightly      Intravitreal Injection, Pharmacologic Agent - OD - Right Eye       Time Out 08/20/2022. 2:31 PM. Confirmed correct patient, procedure, site, and patient consented.   Anesthesia Topical anesthesia was used. Anesthetic medications included Lidocaine 4%.   Procedure Preparation included 5% betadine to ocular surface, 10% betadine to eyelids. A 30 gauge needle was used.   Injection: 1.25 mg Bevacizumab 1.'25mg'$ /0.23m   Route: Intravitreal, Site: Right Eye   NDC: [id number] Lot:: [id number] Expiration date: 11/07/2022   Post-op Post injection exam found visual acuity of at least counting fingers. The patient tolerated the procedure well. There were no complications. The patient received written and verbal post procedure care education. Post injection medications included gentamicin.              ASSESSMENT/PLAN:  Diabetic macular edema of right eye with moderate nonproliferative retinopathy associated with type 1 diabetes mellitus ([place]  The nature of diabetic macular edema was discussed with the patient. Treatment options were outlined including medical therapy, laser & vitrectomy. The use of injectable medications reviewed, including Avastin, Lucentis, and Eylea. Periodic injections into the eye are likely to resolve diabetic macular  edema (swelling in the center of vision). Initially, injections are delivered are delivered every 4-6 weeks, and the interval extended as the condition improves. On average, 8-9 injections the first year, and 5 in year 2. Improvement in the condition most often improves on medical therapy. Occasional use of focal laser is also recommended for residual macular edema (swelling). Excellent control of blood glucose and blood pressure are encouraged under the care of a primary physician or endocrinologist. Similarly, attempts to maintain serum cholesterol, low density lipoproteins, and high-density lipoproteins in a favorable range were recommended.   Center involved CME CSME at 9 weeks today repeat injection today to maintain and extend interval examination 3 months dilate OU      ICD-10-CM   1. Diabetic macular edema of right eye with moderate nonproliferative retinopathy associated with type 1 diabetes mellitus (HCC)  E10.3311 OCT, Retina - OU - Both Eyes    Intravitreal Injection, Pharmacologic Agent - OD - Right Eye    Bevacizumab (AVASTIN) SOLN 1.25 mg      1.  OD, center involved CSME improved overall and maintained at 9 weeks post most recent injection.  Repeat injection today reevaluate next in 3 months  2.  Late OU next  3.  Ophthalmic Meds Ordered this visit:  Meds ordered this encounter  Medications   Bevacizumab (AVASTIN) SOLN 1.25 mg       Return in about 3 months (around 11/20/2022) for DILATE OU, AVASTIN OCT, OD.  There are no Patient Instructions on file for this visit.   Explained the diagnoses, plan, and follow up with the patient and they expressed understanding.  Patient expressed understanding of the importance of proper follow up care.   GClent DemarkRankin M.D. Diseases & Surgery of the Retina and Vitreous Retina & Diabetic EM[place]08/28/23  Abbreviations: M myopia (nearsighted); A astigmatism; H hyperopia (farsighted); P presbyopia; Mrx spectacle  prescription;  CTL contact lenses; OD right eye; OS left eye; OU both eyes  XT exotropia; ET esotropia; PEK punctate epithelial keratitis; PEE punctate epithelial erosions; DES dry eye syndrome; MGD meibomian gland dysfunction; ATs artificial tears; PFAT's preservative free artificial tears; [place] nuclear sclerotic cataract; PSC posterior subcapsular cataract; ERM epi-retinal membrane; PVD posterior vitreous detachment; RD retinal detachment; DM diabetes mellitus; DR diabetic retinopathy; NPDR non-proliferative diabetic retinopathy; PDR proliferative diabetic retinopathy; CSME clinically significant macular edema; DME diabetic macular edema; dbh dot blot hemorrhages; CWS cotton wool spot; POAG primary open angle glaucoma; C/D cup-to-disc ratio; HVF humphrey visual field; GVF goldmann visual field; OCT optical coherence tomography; IOP intraocular pressure; BRVO Branch retinal vein occlusion; CRVO central retinal vein occlusion; CRAO central retinal artery occlusion; BRAO branch retinal artery occlusion; RT retinal tear; SB scleral buckle; PPV pars plana vitrectomy; VH Vitreous hemorrhage; PRP panretinal laser photocoagulation; IVK intravitreal kenalog; VMT vitreomacular traction; MH Macular hole;  NVD neovascularization of the disc; NVE neovascularization elsewhere; AREDS age related eye disease study; ARMD age related macular degeneration; POAG primary open angle glaucoma; EBMD epithelial/anterior basement membrane dystrophy; ACIOL anterior chamber intraocular lens; IOL intraocular lens; PCIOL posterior chamber intraocular lens; Phaco/IOL phacoemulsification with intraocular lens placement; [place] photorefractive keratectomy; LASIK laser assisted in situ keratomileusis; HTN hypertension; DM diabetes mellitus; COPD chronic obstructive pulmonary disease

## 2022-11-20 ENCOUNTER — Encounter (INDEPENDENT_AMBULATORY_CARE_PROVIDER_SITE_OTHER): Payer: Medicare Other | Admitting: Ophthalmology

## 2023-02-19 IMAGING — CT CT HEAD W/O CM
3 series · 16 of 47 positions shown, 19 images · non-contrast
Comparison: None.

CLINICAL DATA: Vertigo, multiple falls

EXAM:
CT HEAD WITHOUT CONTRAST
TECHNIQUE: Contiguous axial images were obtained from the base of the skull
through the vertex without intravenous contrast.

[Series 3: head wo · axial · 0.41mm/px · z∈[+72,+197]mm · 10 of 30 slices shown, 13 images]
[im 3/30  brain]
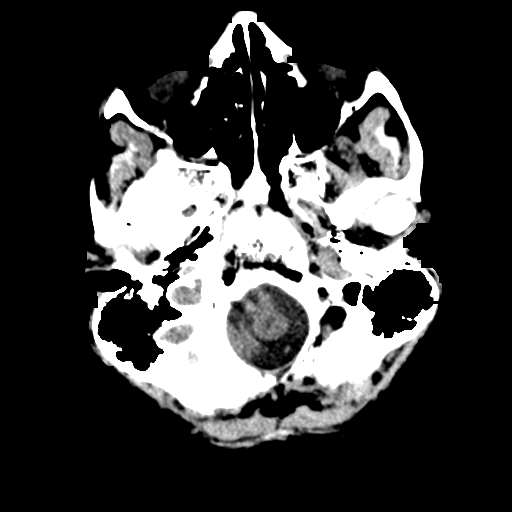
[im 3/30  bone]
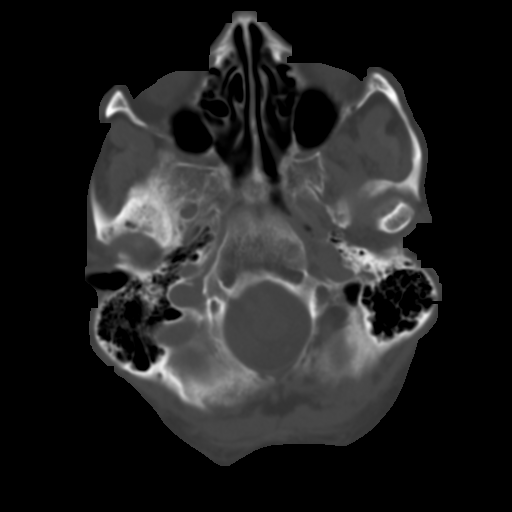
[im 6/30  brain]
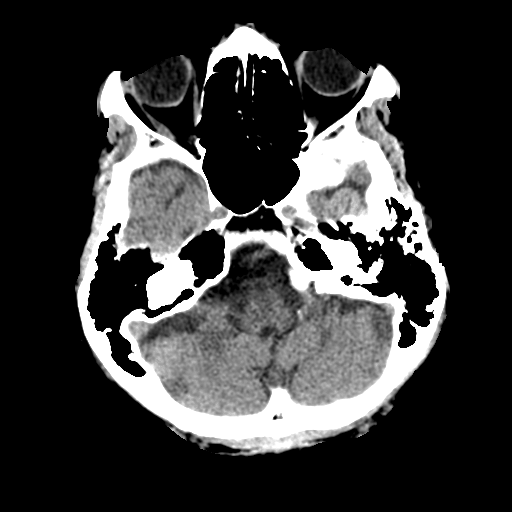
[im 9/30  brain]
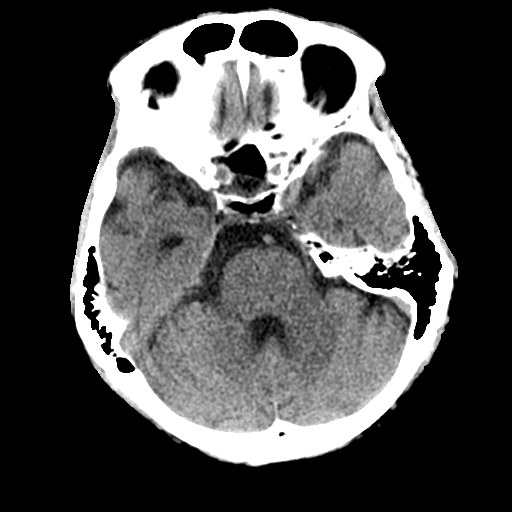
[im 11/30  brain]
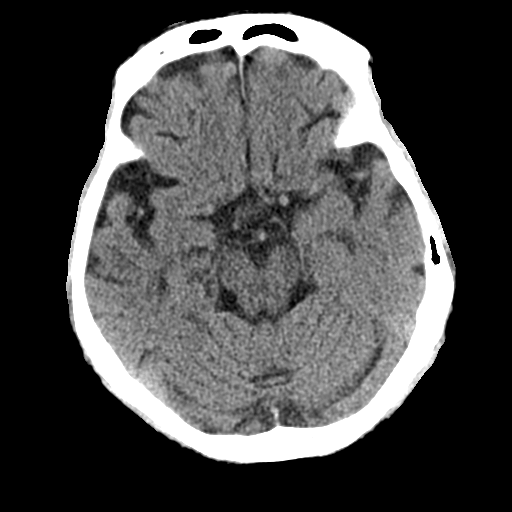
[im 14/30  brain]
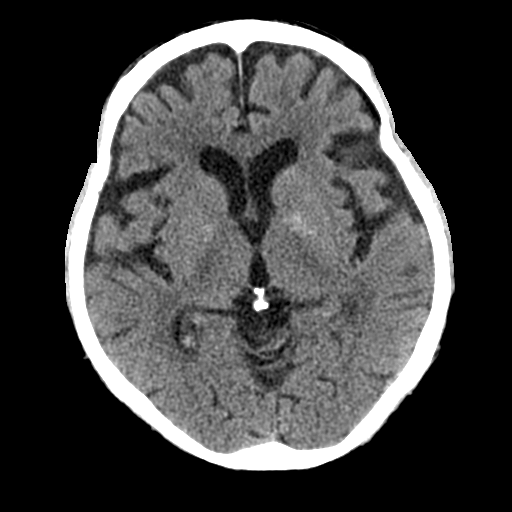
[im 14/30  bone]
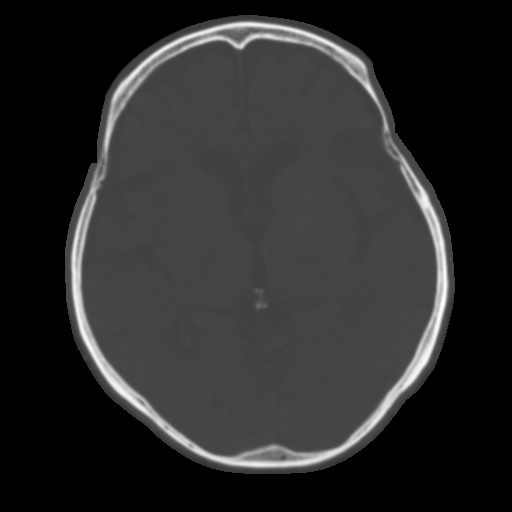
[im 17/30  brain]
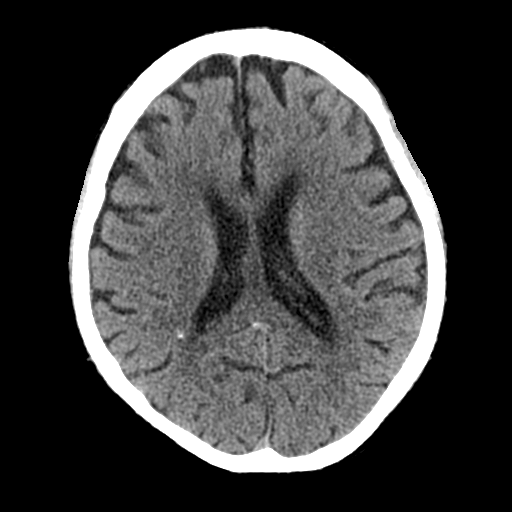
[im 20/30  brain]
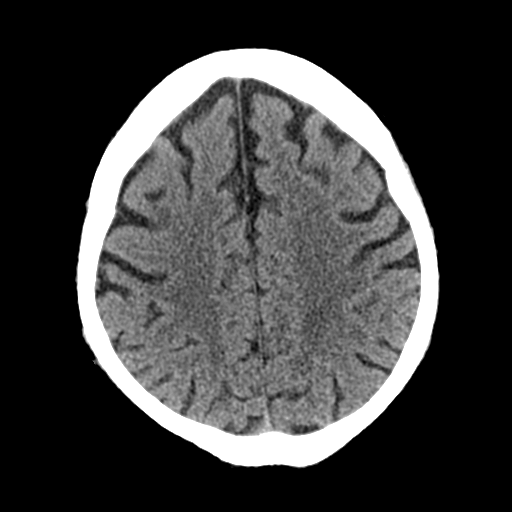
[im 23/30  brain]
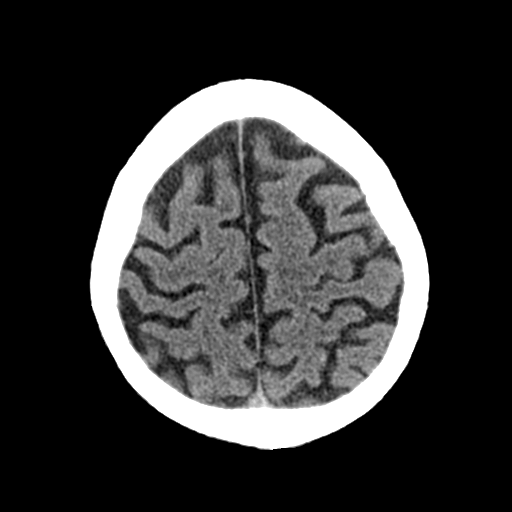
[im 25/30  brain]
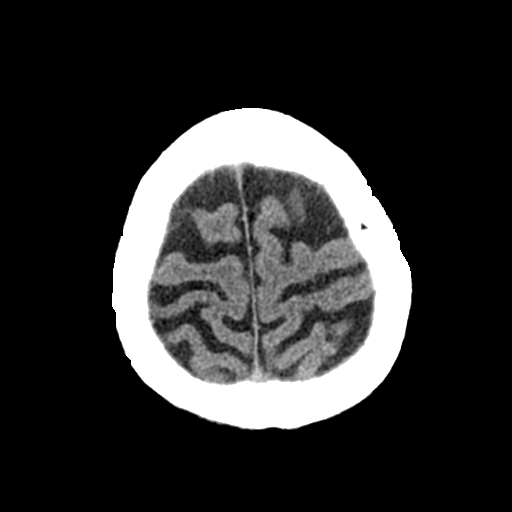
[im 25/30  bone]
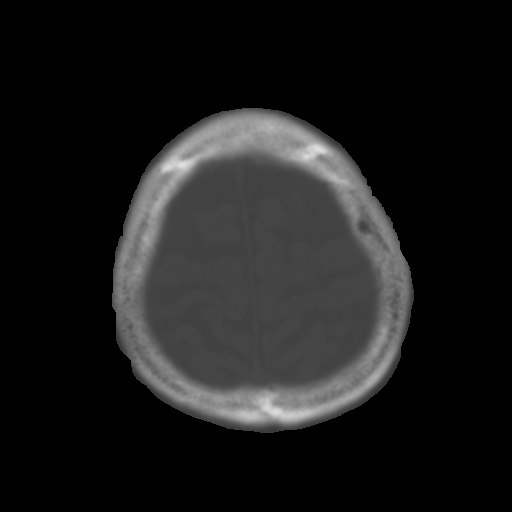
[im 28/30  brain]
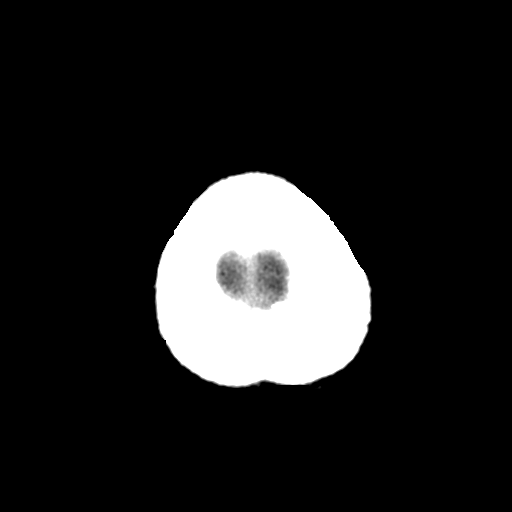

[Series 4: coronal soft tissue · coronal · 0.30mm/px · 3 of 67 slices shown]
[im 23/67  brain]
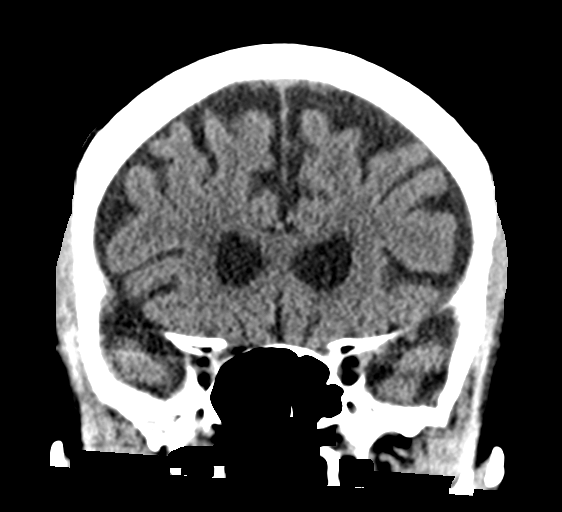
[im 30/67  brain]
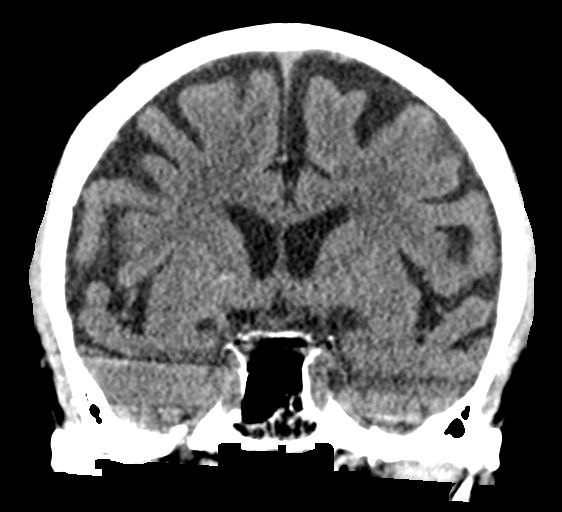
[im 37/67  brain]
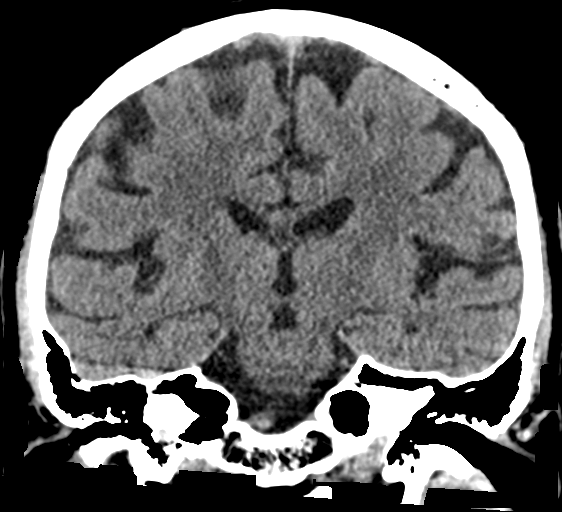

[Series 5: sagittal soft tissue · sagittal · 0.30mm/px · 3 of 57 slices shown]
[im 19/57  brain]
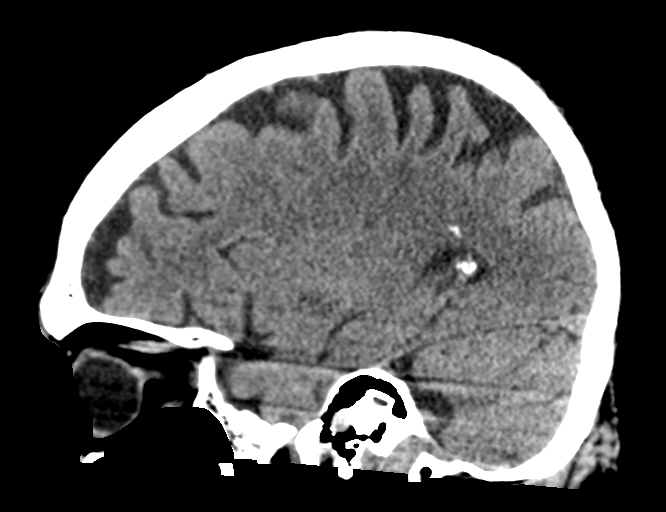
[im 29/57  brain]
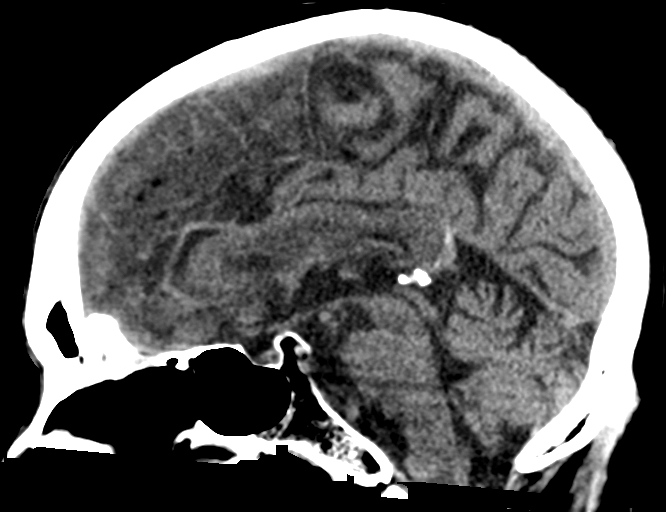
[im 38/57  brain]
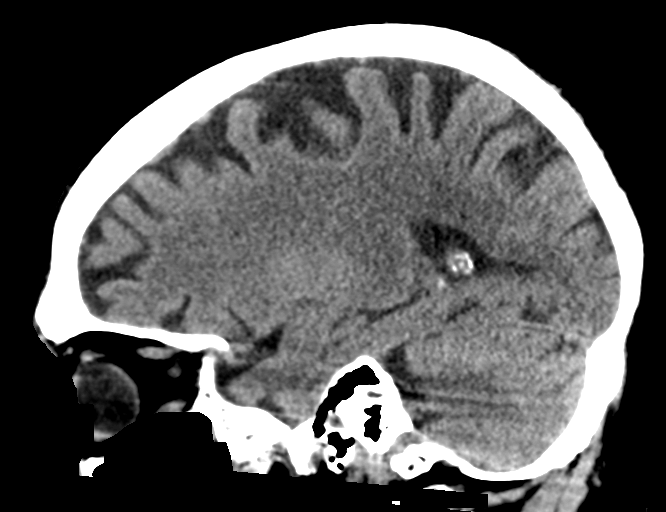

[16 of 47 positions shown; findings below may reference images not displayed]

FINDINGS: Brain: No evidence of acute infarction, hemorrhage, hydrocephalus,
extra-axial collection or mass lesion/mass effect. Mild
periventricular white matter hypodensity. Calcifications of the
basal ganglia (series 3, image 13).

Vascular: No hyperdense vessel or unexpected calcification.

Skull: Normal. Negative for fracture or focal lesion.

Sinuses/Orbits: No acute finding.

Other: None.
IMPRESSION: No acute intracranial pathology. Small-vessel white matter disease.

## 2023-03-06 NOTE — Progress Notes (Addendum)
[facility] Note  03/19/2023     CHIEF COMPLAINT Patient presents for Retina Evaluation   HISTORY OF PRESENT ILLNESS: Dylan Dalto. is a 87 y.o. male who presents to the clinic today for:   HPI     Retina Evaluation   In both eyes.  Context:  distance vision, mid-range vision and near vision.  Treatments tried include no treatments.  I, the attending physician,  performed the HPI with the patient and updated documentation appropriately.        Comments   Patient diabetic for 26 years. BS was 251 this am. A1c was 8.9 on 02/12/23. Has had injections of avastin OU with Dr. Zadie Rhine for diabetic macular edema. Last injection was 08.28.23 for OD only. On several drops for glaucoma/IOP control.      Last edited by Bernarda Caffey, MD on 03/19/2023  4:18 PM.    Pt is a previous Dr. Zadie Rhine pt here due to insurance reasons, pt was receiving injections in his right eye, the last one being in August 2023, pt has previously gotten injections in the left eye also, pt states Dr. Lucianne Lei told him the pressure in his right eye has increased and she has recommended a procedure in that eye, pt states he is consistent with using his glaucoma drops, he states there is a "slight cloud" in his vision  Referring physician: Lisabeth Pick, MD [place],  [place] [id number]  HISTORICAL INFORMATION:   Selected notes from the MEDICAL RECORD NUMBER Previous Dr. Zadie Rhine pt, current Dr. Lucianne Lei pt here for DME LEE:  Ocular Hx- s/p antiVEGF OU w/ [facility], last injxn IVA OD, 08.28.23 PMH-    CURRENT MEDICATIONS: Current Outpatient Medications (Ophthalmic Drugs)  Medication Sig   brimonidine-timolol (COMBIGAN) 0.2-0.5 % ophthalmic solution Place 1 drop into both eyes 2 (two) times daily.    dorzolamide (TRUSOPT) 2 % ophthalmic solution Place 1 drop into both eyes 2 (two) times daily.   ROCKLATAN 0.02-0.005 % SOLN Place 1 drop into the right eye at bedtime.    latanoprost (XALATAN) 0.005 % ophthalmic solution Place 1 drop into both eyes at bedtime. (Patient not taking: Reported on 03/19/2023)   No current facility-administered medications for this visit. (Ophthalmic Drugs)   Current Outpatient Medications (Other)  Medication Sig   aspirin EC 81 MG tablet Take 81 mg by mouth as needed. Swallow whole.   buPROPion (WELLBUTRIN SR) 150 MG 12 hr tablet Take 150 mg by mouth daily.   hydrOXYzine (ATARAX) 10 MG tablet Take 10 mg by mouth 3 (three) times daily as needed.   insulin glargine (LANTUS) 100 UNIT/ML Solostar Pen Inject 18 Units into the skin daily.    insulin lispro (HUMALOG) 100 UNIT/ML injection Inject 0-10 Units into the skin 3 (three) times daily before meals. + sliding scale if BG>150   levocetirizine (XYZAL) 5 MG tablet Take 5 mg by mouth every evening.   metoprolol tartrate (LOPRESSOR) 25 MG tablet Take 25 mg by mouth 2 (two) times daily.   montelukast (SINGULAIR) 10 MG tablet Take 10 mg by mouth at bedtime.   olmesartan (BENICAR) 40 MG tablet Take 1 tablet (40 mg total) by mouth daily.   pregabalin (LYRICA) 25 MG capsule Take 75 mg by mouth as directed.   clobetasol cream (TEMOVATE) [id number] % Apply 1 application topically 2 (two) times daily as needed (for rash).  (Patient not taking: Reported on 03/19/2023)   colchicine 0.6 MG tablet Take  2 tablets by mouth. 2 tablets at first sign of gout flare followed by 1 (Patient not taking: Reported on 03/19/2023)   Cyanocobalamin (B-12 IJ) Inject 1 mcg as directed every 30 (thirty) days. (Patient not taking: Reported on 03/19/2023)   Docusate Sodium (COLACE PO) Take 2 capsules by mouth as needed. (Patient not taking: Reported on 03/19/2023)   glucagon (GLUCAGON EMERGENCY) 1 MG injection Inject 1 mg into the muscle once as needed. (Patient not taking: Reported on 03/19/2023)   hydrochlorothiazide (MICROZIDE) 12.5 MG capsule Take 1 capsule by mouth daily.   hydrocortisone (ANUSOL-HC) 25 MG suppository Place 1  suppository (25 mg total) rectally at bedtime. (Patient not taking: Reported on 03/19/2023)   levocetirizine (XYZAL) 5 MG tablet Take 1 tablet by mouth daily. In evening   meclizine (ANTIVERT) 25 MG tablet Take 1 tablet (25 mg total) by mouth 2 (two) times daily as needed for dizziness. (Patient not taking: Reported on 03/19/2023)   potassium chloride SA (K-DUR,KLOR-CON) 20 MEQ tablet Take 2 tablets (40 mEq total) by mouth daily. (Patient not taking: Reported on 03/19/2023)   pregabalin (LYRICA) 100 MG capsule Take 1 capsule (100 mg total) by mouth 2 (two) times daily. (Patient not taking: Reported on 04/11/2021)   No current facility-administered medications for this visit. (Other)   REVIEW OF SYSTEMS: ROS   Positive for: Neurological, Musculoskeletal, Endocrine, Eyes Negative for: Constitutional, Gastrointestinal, Skin, Genitourinary, HENT, Cardiovascular, Respiratory, Psychiatric, Allergic/Imm, Heme/Lymph Last edited by Roselee Nova D, COT on 03/19/2023  2:21 PM.     ALLERGIES Allergies  Allergen Reactions   Lisinopril Hives    hives   PAST MEDICAL HISTORY Past Medical History:  Diagnosis Date   BENIGN PROSTATIC HYPERTROPHY 07/19/2009   COLONIC POLYPS 02/14/2010   DEPRESSION 03/16/2008   DIABETES MELLITUS, TYPE I 06/30/2007   Dyslipidemia    FOLLICULITIS 0000000   GLAUCOMA 07/19/2009   HEARING LOSS 02/14/2010   HYPERTENSION 05/14/2008   HYPOGONADISM, MALE 12/16/2007   Leukopenia    LUMBAR RADICULOPATHY, LEFT 02/23/2010   Other osteoporosis 12/16/2007   PERIPHERAL NEUROPATHY 06/30/2007   Prostate cancer ([place])    PROTEINURIA, MILD 02/14/2010   Psoriasis    URINARY CALCULUS 12/27/2010   Past Surgical History:  Procedure Laterality Date   APPENDECTOMY  1986   CATARACT EXTRACTION  2006   CATARACT EXTRACTION  1997   TEE WITHOUT CARDIOVERSION N/A 12/19/2016   Procedure: TRANSESOPHAGEAL ECHOCARDIOGRAM (TEE);  Surgeon: Pixie Casino, MD;  Location[facility] ENDOSCOPY;  Service: Cardiovascular;   Laterality: N/A;   FAMILY HISTORY Family History  Problem Relation Age of Onset   Cancer Father        Colon Cancer   Heart disease Father    Diabetes Maternal Aunt    Diabetes Maternal Uncle    High blood pressure Other    SOCIAL HISTORY Social History   Tobacco Use   Smoking status: Never   Smokeless tobacco: Never  Substance Use Topics   Alcohol use: No    Comment: rare   Drug use: No       OPHTHALMIC EXAM:  Base Eye Exam     Visual Acuity (Snellen - Linear)       Right Left   Dist cc 20/30 -2 20/25 -1   Dist ph cc NI NI    Correction: Glasses         Tonometry (Tonopen, 2:30 PM)       Right Left   Pressure 08 10  Pupils       Dark Light Shape React APD   Right 3 2 Round Minimal None   Left 3 2 Round Minimal None         Visual Fields       Left Right   Restrictions Partial outer superior temporal, inferior temporal, superior nasal, inferior nasal deficiencies Partial outer superior temporal, inferior temporal, superior nasal, inferior nasal deficiencies         Extraocular Movement       Right Left    Full, Ortho Full, Ortho         Neuro/Psych     Oriented x3: Yes   Mood/Affect: Normal         Dilation     Both eyes: 1.0% Mydriacyl, 2.5% Phenylephrine @ 2:30 PM           Slit Lamp and Fundus Exam     Slit Lamp Exam       Right Left   Lids/Lashes Dermatochalasis - upper lid Dermatochalasis - upper lid   Conjunctiva/Sclera White and quiet White and quiet   Cornea arcus, 1+ Punctate epithelial erosions arcus, 1+ Punctate epithelial erosions   Anterior Chamber deep and clear deep and clear   Iris Round and dilated, No NVI Round and dilated, No NVI   Lens 3 piece PC IOL in good position 3 piece PC IOL in good position   Anterior Vitreous Vitreous syneresis mild syneresis, silicone oil microbubbles         Fundus Exam       Right Left   Posterior Vitreous Posterior vitreous detachment Posterior vitreous  detachment   Disc 2-3+Pallor, +cupping, thin inferior rim 2-3+Pallor, +cupping, very thin inferior rim   C/D Ratio 0.9 0.95   Macula Flat, Blunted foveal reflex, scattered MA and cystic changes Flat, Blunted foveal reflex, scattered cystic changes and MA   Vessels NPDR- Moderate, attenuated, Tortuous attenuated, Tortuous, copper wiring   Periphery Attached, scattered MA Attached, scattered MA           Refraction     Wearing Rx       Sphere Cylinder Axis Add   Right -1.00 +1.00 005 +2.75   Left -1.00 +0.50 180 +2.75         Manifest Refraction       Sphere Cylinder Axis Dist VA   Right -1.00 +0.75 015 20/30-2   Left -0.25 +0.75 180 20/30           IMAGING AND PROCEDURES  Imaging and Procedures for 03/19/2023  OCT, Retina - OU - Both Eyes       Right Eye Quality was good. Scan locations included subfoveal. Central Foveal Thickness: 353. Progression has no prior data. Findings include no SRF, abnormal foveal contour, epiretinal membrane, intraretinal fluid (Scattered cystic changes / DME greatest centrally, mild ERM).   Left Eye Quality was good. Scan locations included subfoveal. Central Foveal Thickness: 317. Progression has no prior data. Findings include no SRF, abnormal foveal contour, intraretinal hyper-reflective material, epiretinal membrane, intraretinal fluid (Scattered cystic changes / DME greatest centrally, mild ERM).   Notes *Images captured and stored on drive  Diagnosis / Impression:  Scattered cystic changes / DME greatest centrally OU mild ERM OU  Clinical management:  See below  Abbreviations: NFP - Normal foveal profile. CME - cystoid macular edema. PED - pigment epithelial detachment. IRF - intraretinal fluid. SRF - subretinal fluid. EZ - ellipsoid zone. ERM - epiretinal membrane. ORA - outer retinal  atrophy. ORT - outer retinal tubulation. SRHM - subretinal hyper-reflective material. IRHM - intraretinal hyper-reflective material       Fluorescein Angiography Optos (Transit OD)       Right Eye Progression has no prior data. Early phase findings include delayed filling, microaneurysm. Mid/Late phase findings include leakage, microaneurysm (Diffuse leaking MA, no NV).   Left Eye Progression has no prior data. Early phase findings include staining, microaneurysm (Focal staining superior macula). Mid/Late phase findings include leakage, staining, microaneurysm (Diffuse leaking MA, no NV).   Notes **Images stored on drive**  Impression: Moderate NPDR OU Diffuse leaking MA, no NV OU      Intravitreal Injection, Pharmacologic Agent - OD - Right Eye       Time Out 03/19/2023. 3:59 PM. Confirmed correct patient, procedure, site, and patient consented.   Anesthesia Topical anesthesia was used. Anesthetic medications included Lidocaine 2%, Proparacaine 0.5%.   Procedure Preparation included 5% betadine to ocular surface, eyelid speculum. A (32g) needle was used.   Injection: 1.25 mg Bevacizumab 1.25mg /0.81ml   Route: Intravitreal, Site: Right Eye   NDC: B9831080, [id number], Expiration date: 06/23/2023   Post-op Post injection exam found visual acuity of at least counting fingers. The patient tolerated the procedure well. There were no complications. The patient received written and verbal post procedure care education.      Intravitreal Injection, Pharmacologic Agent - OS - Left Eye       Time Out 03/19/2023. 3:59 PM. Confirmed correct patient, procedure, site, and patient consented.   Anesthesia Topical anesthesia was used. Anesthetic medications included Lidocaine 2%, Proparacaine 0.5%.   Procedure Preparation included 5% betadine to ocular surface, eyelid speculum. A (32g) needle was used.   Injection: 1.25 mg Bevacizumab 1.25mg /0.32ml   Route: Intravitreal, Site: Left Eye   NDC: B9831080, [id number], Expiration date: 06/27/2023   Post-op Post injection exam found visual acuity  of at least counting fingers. The patient tolerated the procedure well. There were no complications. The patient received written and verbal post procedure care education. Post injection medications were not given.            ASSESSMENT/PLAN:    ICD-10-CM   1. Moderate nonproliferative diabetic retinopathy of both eyes with macular edema associated with type 1 diabetes mellitus (HCC)  E10.3313 OCT, Retina - OU - Both Eyes    Intravitreal Injection, Pharmacologic Agent - OD - Right Eye    Intravitreal Injection, Pharmacologic Agent - OS - Left Eye    Bevacizumab (AVASTIN) SOLN 1.25 mg    Bevacizumab (AVASTIN) SOLN 1.25 mg    2. Essential hypertension  I10     3. Hypertensive retinopathy of both eyes  H35.033 Fluorescein Angiography Optos (Transit OD)    4. Pseudophakia, both eyes  Z96.1     5. Primary open angle glaucoma (POAG) of both eyes, severe stage  H40.1133      Moderate Non-proliferative diabetic retinopathy, both eyes  - previously managed by Dr. Zadie Rhine -- referred here by Dr. Lucianne Lei due to insurance  - s/p IVA OD x4 (last one 08.28.23), s/p IVA OS x6 (last one 02.03.22) with Dr. Zadie Rhine - The incidence, risk factors for progression, natural history and treatment options for diabetic retinopathy were discussed with patient.   - The need for close monitoring of blood glucose, blood pressure, and serum lipids, avoiding cigarette or any type of tobacco, and the need for long term follow up was also discussed with patient. - exam shows scattered MA OU -  FA (03.26.24) shows diffuse leaking MA, no NV OU - OCT shows diabetic macular edema, both eyes  - The natural history, pathology, and characteristics of diabetic macular edema discussed with patient.  A generalized discussion of the major clinical trials concerning treatment of diabetic macular edema (ETDRS, DCT, SCORE, RISE / RIDE, and ongoing DRCR net studies) was completed.  This discussion included mention of the various  approaches to treating diabetic macular edema (observation, laser photocoagulation, anti-VEGF injections with lucentis / Avastin / Eylea, steroid injections with Kenalog / Ozurdex, and intraocular surgery with vitrectomy).  The goal hemoglobin A1C of 6-7 was discussed, as well as importance of smoking cessation and hypertension control.  Need for ongoing treatment and monitoring were specifically discussed with reference to chronic nature of diabetic macular edema. - recommend IVA OU (OD #5 and OS #7) today, 03.26.24, for +DME - pt wishes to proceed with injections - RBA of procedure discussed, questions answered - informed consent obtained and signed - see procedure note - f/u in 4 wks -- DFE/OCT, possible injection  2,3. Hypertensive retinopathy OU - discussed importance of tight BP control - monitor  4. Pseudophakia OU  - s/p CE/IOL ([place])  - IOL in good position, doing well  - monitor  5. POAG, severe stage  - under the expert management of Dr. Lucianne Lei  - IOP 8,10  - currently on dorz BID OU; combigan BID OU, Rocklatan qhs OU   Ophthalmic Meds Ordered this visit:  Meds ordered this encounter  Medications   Bevacizumab (AVASTIN) SOLN 1.25 mg   Bevacizumab (AVASTIN) SOLN 1.25 mg     Return in about 4 weeks (around 04/16/2023) for f/u NPDR OU, DFE, OCT.  There are no Patient Instructions on file for this visit.   Explained the diagnoses, plan, and follow up with the patient and they expressed understanding.  Patient expressed understanding of the importance of proper follow up care.   This document serves as a record of services personally performed by Gardiner Sleeper, MD, PhD. It was created on their behalf by Renaldo Reel, [place] an ophthalmic technician. The creation of this record is the provider's dictation and/or activities during the visit.    Electronically signed by:  Renaldo Reel, COT  03.13.24 4:51 PM  This document serves as a record of services  personally performed by Gardiner Sleeper, MD, PhD. It was created on their behalf by San Jetty. Owens Shark, OA an ophthalmic technician. The creation of this record is the provider's dictation and/or activities during the visit.    Electronically signed by: San Jetty. Owens Shark, [place] 03.26.2024 4:51 PM  Gardiner Sleeper, M.D., Ph.D. Diseases & Surgery of the Retina and Vitreous [facility]  I have reviewed the above documentation for accuracy and completeness, and I agree with the above. Gardiner Sleeper, M.D., Ph.D. 03/19/23 4:51 PM  Abbreviations: M myopia (nearsighted); A astigmatism; H hyperopia (farsighted); P presbyopia; Mrx spectacle prescription;  CTL contact lenses; OD right eye; OS left eye; OU both eyes  XT exotropia; ET esotropia; PEK punctate epithelial keratitis; PEE punctate epithelial erosions; DES dry eye syndrome; MGD meibomian gland dysfunction; ATs artificial tears; PFAT's preservative free artificial tears; [place] nuclear sclerotic cataract; PSC posterior subcapsular cataract; ERM epi-retinal membrane; PVD posterior vitreous detachment; RD retinal detachment; DM diabetes mellitus; DR diabetic retinopathy; NPDR non-proliferative diabetic retinopathy; PDR proliferative diabetic retinopathy; CSME clinically significant macular edema; DME diabetic macular edema; dbh dot blot hemorrhages; CWS cotton wool spot;  POAG primary open angle glaucoma; C/D cup-to-disc ratio; HVF humphrey visual field; GVF goldmann visual field; OCT optical coherence tomography; IOP intraocular pressure; BRVO Branch retinal vein occlusion; CRVO central retinal vein occlusion; CRAO central retinal artery occlusion; BRAO branch retinal artery occlusion; RT retinal tear; SB scleral buckle; PPV pars plana vitrectomy; VH Vitreous hemorrhage; PRP panretinal laser photocoagulation; IVK intravitreal kenalog; VMT vitreomacular traction; MH Macular hole;  NVD neovascularization of the disc; NVE neovascularization  elsewhere; AREDS age related eye disease study; ARMD age related macular degeneration; POAG primary open angle glaucoma; EBMD epithelial/anterior basement membrane dystrophy; ACIOL anterior chamber intraocular lens; IOL intraocular lens; PCIOL posterior chamber intraocular lens; Phaco/IOL phacoemulsification with intraocular lens placement; [place] photorefractive keratectomy; LASIK laser assisted in situ keratomileusis; HTN hypertension; DM diabetes mellitus; COPD chronic obstructive pulmonary disease

## 2023-03-19 ENCOUNTER — Ambulatory Visit (INDEPENDENT_AMBULATORY_CARE_PROVIDER_SITE_OTHER): Payer: Medicare Other | Admitting: Ophthalmology

## 2023-03-19 ENCOUNTER — Encounter (INDEPENDENT_AMBULATORY_CARE_PROVIDER_SITE_OTHER): Payer: Self-pay | Admitting: Ophthalmology

## 2023-03-19 DIAGNOSIS — H35033 Hypertensive retinopathy, bilateral: Secondary | ICD-10-CM

## 2023-03-19 DIAGNOSIS — I10 Essential (primary) hypertension: Secondary | ICD-10-CM

## 2023-03-19 DIAGNOSIS — E103313 Type 1 diabetes mellitus with moderate nonproliferative diabetic retinopathy with macular edema, bilateral: Secondary | ICD-10-CM | POA: Diagnosis not present

## 2023-03-19 DIAGNOSIS — H401133 Primary open-angle glaucoma, bilateral, severe stage: Secondary | ICD-10-CM

## 2023-03-19 DIAGNOSIS — Z961 Presence of intraocular lens: Secondary | ICD-10-CM | POA: Diagnosis not present

## 2023-03-19 MED ORDER — BEVACIZUMAB CHEMO INJECTION 1.25MG/0.05ML SYRINGE FOR KALEIDOSCOPE
1.2500 mg | INTRAVITREAL | Status: AC | PRN
  Administered 2023-03-19: 1.25 mg via INTRAVITREAL

## 2023-04-16 ENCOUNTER — Encounter (INDEPENDENT_AMBULATORY_CARE_PROVIDER_SITE_OTHER): Payer: Medicare Other | Admitting: Ophthalmology

## 2023-04-16 ENCOUNTER — Encounter (INDEPENDENT_AMBULATORY_CARE_PROVIDER_SITE_OTHER): Payer: Self-pay

## 2023-04-16 DIAGNOSIS — Z961 Presence of intraocular lens: Secondary | ICD-10-CM

## 2023-04-16 DIAGNOSIS — H401133 Primary open-angle glaucoma, bilateral, severe stage: Secondary | ICD-10-CM

## 2023-04-16 DIAGNOSIS — I10 Essential (primary) hypertension: Secondary | ICD-10-CM

## 2023-04-16 DIAGNOSIS — H35033 Hypertensive retinopathy, bilateral: Secondary | ICD-10-CM

## 2023-04-16 DIAGNOSIS — E103313 Type 1 diabetes mellitus with moderate nonproliferative diabetic retinopathy with macular edema, bilateral: Secondary | ICD-10-CM

## 2023-12-10 ENCOUNTER — Encounter (INDEPENDENT_AMBULATORY_CARE_PROVIDER_SITE_OTHER): Payer: Medicare Other | Admitting: Ophthalmology

## 2023-12-10 DIAGNOSIS — H401133 Primary open-angle glaucoma, bilateral, severe stage: Secondary | ICD-10-CM

## 2023-12-10 DIAGNOSIS — E103313 Type 1 diabetes mellitus with moderate nonproliferative diabetic retinopathy with macular edema, bilateral: Secondary | ICD-10-CM

## 2023-12-10 DIAGNOSIS — I10 Essential (primary) hypertension: Secondary | ICD-10-CM

## 2023-12-10 DIAGNOSIS — H35033 Hypertensive retinopathy, bilateral: Secondary | ICD-10-CM

## 2023-12-10 DIAGNOSIS — Z961 Presence of intraocular lens: Secondary | ICD-10-CM

## 2024-01-13 ENCOUNTER — Emergency Department: Payer: No Typology Code available for payment source

## 2024-01-13 ENCOUNTER — Encounter
Admission: EM | Disposition: A | Discharge status: Home Health | Payer: Self-pay | Source: Home / Self Care | Attending: Internal Medicine

## 2024-01-13 ENCOUNTER — Inpatient Hospital Stay
Admission: EM | Admit: 2024-01-13 | Discharge: 2024-01-17 | DRG: 336 | Disposition: A | Discharge status: Home Health | Payer: No Typology Code available for payment source | Attending: Residents | Admitting: Residents

## 2024-01-13 ENCOUNTER — Inpatient Hospital Stay: Payer: No Typology Code available for payment source | Admitting: Anesthesiology

## 2024-01-13 DIAGNOSIS — K460 Unspecified abdominal hernia with obstruction, without gangrene: Secondary | ICD-10-CM | POA: Diagnosis present

## 2024-01-13 DIAGNOSIS — I110 Hypertensive heart disease with heart failure: Secondary | ICD-10-CM | POA: Diagnosis present

## 2024-01-13 DIAGNOSIS — E1029 Type 1 diabetes mellitus with other diabetic kidney complication: Secondary | ICD-10-CM | POA: Diagnosis present

## 2024-01-13 DIAGNOSIS — N3090 Cystitis, unspecified without hematuria: Secondary | ICD-10-CM | POA: Diagnosis present

## 2024-01-13 DIAGNOSIS — I5022 Chronic systolic (congestive) heart failure: Secondary | ICD-10-CM | POA: Diagnosis present

## 2024-01-13 DIAGNOSIS — R531 Weakness: Secondary | ICD-10-CM

## 2024-01-13 DIAGNOSIS — E1042 Type 1 diabetes mellitus with diabetic polyneuropathy: Secondary | ICD-10-CM | POA: Diagnosis present

## 2024-01-13 DIAGNOSIS — E1129 Type 2 diabetes mellitus with other diabetic kidney complication: Secondary | ICD-10-CM | POA: Diagnosis present

## 2024-01-13 DIAGNOSIS — Z794 Long term (current) use of insulin: Secondary | ICD-10-CM

## 2024-01-13 DIAGNOSIS — N390 Urinary tract infection, site not specified: Secondary | ICD-10-CM

## 2024-01-13 DIAGNOSIS — Z8546 Personal history of malignant neoplasm of prostate: Secondary | ICD-10-CM

## 2024-01-13 DIAGNOSIS — E1065 Type 1 diabetes mellitus with hyperglycemia: Secondary | ICD-10-CM | POA: Diagnosis present

## 2024-01-13 DIAGNOSIS — K562 Volvulus: Principal | ICD-10-CM

## 2024-01-13 DIAGNOSIS — R109 Unspecified abdominal pain: Secondary | ICD-10-CM

## 2024-01-13 DIAGNOSIS — K5652 Intestinal adhesions [bands] with complete obstruction: Secondary | ICD-10-CM | POA: Diagnosis present

## 2024-01-13 DIAGNOSIS — N3000 Acute cystitis without hematuria: Secondary | ICD-10-CM | POA: Diagnosis present

## 2024-01-13 DIAGNOSIS — J479 Bronchiectasis, uncomplicated: Secondary | ICD-10-CM | POA: Diagnosis present

## 2024-01-13 DIAGNOSIS — I10 Essential (primary) hypertension: Secondary | ICD-10-CM | POA: Diagnosis present

## 2024-01-13 DIAGNOSIS — N179 Acute kidney failure, unspecified: Secondary | ICD-10-CM | POA: Diagnosis present

## 2024-01-13 DIAGNOSIS — Z79899 Other long term (current) drug therapy: Secondary | ICD-10-CM

## 2024-01-13 HISTORY — PX: LAPAROSCOPY, DIAGNOSTIC BARIATRIC: SHX510371

## 2024-01-13 HISTORY — PX: LAPAROSCOPY, DIAGNOSTIC: SHX4584

## 2024-01-13 LAB — COVID-19 (SARS-COV-2) & INFLUENZA  A/B, NAA (ROCHE LIAT)
Influenza A RNA: NOT DETECTED
Influenza B RNA: NOT DETECTED
SARS-CoV-2 (COVID-19) RNA: NOT DETECTED

## 2024-01-13 LAB — COMPREHENSIVE METABOLIC PANEL
ALT: <6 U/L (ref <=55)
AST (SGOT): 14 U/L (ref <=41)
Albumin/Globulin Ratio: 1 (ref 0.9–2.2)
Albumin: 3.5 g/dL (ref 3.5–5.0)
Alkaline Phosphatase: 159 U/L — ABNORMAL HIGH (ref 37–117)
Anion Gap: 11 (ref 5.0–15.0)
BUN: 26 mg/dL (ref 9–28)
Bilirubin, Total: 0.6 mg/dL (ref 0.2–1.2)
CO2: 25 meq/L (ref 17–29)
Calcium: 9.3 mg/dL (ref 7.9–10.2)
Chloride: 102 meq/L (ref 99–111)
Creatinine: 1.2 mg/dL (ref 0.5–1.5)
GFR: 59.3 mL/min/{1.73_m2} — ABNORMAL LOW (ref >=60.0)
Globulin: 3.4 g/dL (ref 2.0–3.6)
Glucose: 240 mg/dL — ABNORMAL HIGH (ref 70–100)
Potassium: 4.5 meq/L (ref 3.5–5.3)
Protein, Total: 6.9 g/dL (ref 6.0–8.3)
Sodium: 138 meq/L (ref 135–145)

## 2024-01-13 LAB — LAB USE ONLY - CBC WITH DIFFERENTIAL
Absolute Basophils: 0.05 10*3/uL (ref 0.00–0.08)
Absolute Eosinophils: 0.15 10*3/uL (ref 0.00–0.44)
Absolute Immature Granulocytes: 0.01 10*3/uL (ref 0.00–0.07)
Absolute Lymphocytes: 0.7 10*3/uL (ref 0.42–3.22)
Absolute Monocytes: 0.52 10*3/uL (ref 0.21–0.85)
Absolute Neutrophils: 5.75 10*3/uL (ref 1.10–6.33)
Absolute nRBC: 0 10*3/uL (ref <=0.00)
Basophils %: 0.7 %
Eosinophils %: 2.1 %
Hematocrit: 38 % (ref 37.6–49.6)
Hemoglobin: 12.6 g/dL (ref 12.5–17.1)
Immature Granulocytes %: 0.1 %
Lymphocytes %: 9.7 %
MCH: 28.9 pg (ref 25.1–33.5)
MCHC: 33.2 g/dL (ref 31.5–35.8)
MCV: 87.2 fL (ref 78.0–96.0)
MPV: 10.8 fL (ref 8.9–12.5)
Monocytes %: 7.2 %
Neutrophils %: 80.2 %
Platelet Count: 327 10*3/uL (ref 142–346)
Preliminary Absolute Neutrophil Count: 5.75 10*3/uL (ref 1.10–6.33)
RBC: 4.36 10*6/uL (ref 4.20–5.90)
RDW: 15 % (ref 11–15)
WBC: 7.18 10*3/uL (ref 3.10–9.50)
nRBC %: 0 /100{WBCs} (ref <=0.0)

## 2024-01-13 LAB — URINALYSIS WITH REFLEX TO MICROSCOPIC EXAM - REFLEX TO CULTURE
Urine Bilirubin: NEGATIVE
Urine Nitrite: NEGATIVE
Urine Specific Gravity: 1.033 (ref 1.001–1.035)
Urine Urobilinogen: NORMAL mg/dL (ref 0.2–2.0)
Urine pH: 6 (ref 5.0–8.0)

## 2024-01-13 LAB — WHOLE BLOOD GLUCOSE POCT: Whole Blood Glucose POCT: 299 mg/dL — ABNORMAL HIGH (ref 70–100)

## 2024-01-13 LAB — LIPASE: Lipase: 50 U/L (ref 8–78)

## 2024-01-13 LAB — LACTIC ACID: Lactic Acid: 1.8 mmol/L (ref 0.2–2.0)

## 2024-01-13 SURGERY — LAPAROSCOPY, DIAGNOSTIC
Anesthesia: Anesthesia General | Site: Pelvis | Wound class: Clean

## 2024-01-13 MED ORDER — ONDANSETRON HCL 4 MG/2ML IJ SOLN
4.0000 mg | Freq: Once | INTRAMUSCULAR | Status: AC | PRN
Start: 2024-01-13 — End: 2024-01-13
  Administered 2024-01-13: 4 mg via INTRAVENOUS
  Filled 2024-01-13: qty 2

## 2024-01-13 MED ORDER — FENTANYL CITRATE (PF) 50 MCG/ML IJ SOLN (WRAP)
INTRAMUSCULAR | Status: AC
Start: 2024-01-13 — End: ?
  Filled 2024-01-13: qty 2

## 2024-01-13 MED ORDER — SUCCINYLCHOLINE CHLORIDE 20 MG/ML IJ SOLN
INTRAMUSCULAR | Status: DC | PRN
Start: 2024-01-13 — End: 2024-01-13
  Administered 2024-01-13: 80 mg via INTRAVENOUS

## 2024-01-13 MED ORDER — MORPHINE SULFATE 2 MG/ML IJ/IV SOLN (WRAP)
2.0000 mg | Freq: Once | Status: AC
Start: 2024-01-13 — End: 2024-01-13
  Administered 2024-01-13: 2 mg via INTRAVENOUS

## 2024-01-13 MED ORDER — STERILE WATER FOR INJECTION IJ/IV SOLN (WRAP)
2.0000 g | INTRAMUSCULAR | Status: AC
Start: 2024-01-14 — End: 2024-01-17
  Administered 2024-01-14 – 2024-01-17 (×4): 2 g via INTRAVENOUS
  Filled 2024-01-13 (×4): qty 2000

## 2024-01-13 MED ORDER — LIDOCAINE HCL (PF) 1 % IJ SOLN
INTRAMUSCULAR | Status: DC | PRN
Start: 2024-01-13 — End: 2024-01-13
  Administered 2024-01-13: 50 mg via INTRAVENOUS

## 2024-01-13 MED ORDER — ONDANSETRON HCL 4 MG/2ML IJ SOLN
4.0000 mg | Freq: Once | INTRAMUSCULAR | Status: DC | PRN
Start: 2024-01-13 — End: 2024-01-13

## 2024-01-13 MED ORDER — ROCURONIUM BROMIDE 10 MG/ML IV SOLN (WRAP)
INTRAVENOUS | Status: DC | PRN
Start: 2024-01-13 — End: 2024-01-13
  Administered 2024-01-13: 10 mg via INTRAVENOUS
  Administered 2024-01-13: 30 mg via INTRAVENOUS
  Administered 2024-01-13: 10 mg via INTRAVENOUS

## 2024-01-13 MED ORDER — ACETAMINOPHEN 10 MG/ML IV SOLN
1000.0000 mg | Freq: Four times a day (QID) | INTRAVENOUS | Status: AC
Start: 2024-01-13 — End: 2024-01-14
  Administered 2024-01-14 (×2): 1000 mg via INTRAVENOUS
  Filled 2024-01-13 (×2): qty 100

## 2024-01-13 MED ORDER — FENTANYL CITRATE (PF) 50 MCG/ML IJ SOLN (WRAP)
INTRAMUSCULAR | Status: DC | PRN
Start: 2024-01-13 — End: 2024-01-13
  Administered 2024-01-13: 25 ug via INTRAVENOUS
  Administered 2024-01-13: 50 ug via INTRAVENOUS
  Administered 2024-01-13: 25 ug via INTRAVENOUS
  Administered 2024-01-13: 75 ug via INTRAVENOUS
  Administered 2024-01-13: 25 ug via INTRAVENOUS

## 2024-01-13 MED ORDER — FENTANYL CITRATE (PF) 50 MCG/ML IJ SOLN (WRAP)
25.0000 ug | INTRAMUSCULAR | Status: DC | PRN
Start: 2024-01-13 — End: 2024-01-13
  Filled 2024-01-13: qty 2

## 2024-01-13 MED ORDER — DIPHENHYDRAMINE HCL 50 MG/ML IJ SOLN
12.5000 mg | INTRAMUSCULAR | Status: DC | PRN
Start: 2024-01-13 — End: 2024-01-13

## 2024-01-13 MED ORDER — OXYCODONE HCL 5 MG PO TABS
5.0000 mg | ORAL_TABLET | Freq: Once | ORAL | Status: AC | PRN
Start: 2024-01-13 — End: 2024-01-13
  Administered 2024-01-13: 5 mg via ORAL
  Filled 2024-01-13: qty 1

## 2024-01-13 MED ORDER — LACTATED RINGERS IV SOLN
INTRAVENOUS | Status: AC
Start: 2024-01-13 — End: 2024-01-15

## 2024-01-13 MED ORDER — SODIUM CHLORIDE 0.9 % IV BOLUS
1000.0000 mL | Freq: Once | INTRAVENOUS | Status: AC
Start: 2024-01-13 — End: 2024-01-13
  Administered 2024-01-13: 1000 mL via INTRAVENOUS

## 2024-01-13 MED ORDER — LIDOCAINE HCL URETHRAL/MUCOSAL 2 % EX PRSY
PREFILLED_SYRINGE | Freq: Once | CUTANEOUS | Status: AC | PRN
Start: 2024-01-13 — End: 2024-01-13
  Filled 2024-01-13: qty 6

## 2024-01-13 MED ORDER — LACTATED RINGERS IV SOLN
INTRAVENOUS | Status: DC | PRN
Start: 2024-01-13 — End: 2024-01-13

## 2024-01-13 MED ORDER — PHENYLEPHRINE HCL 10 MG/ML IV SOLN (WRAP)
Status: DC | PRN
Start: 2024-01-13 — End: 2024-01-13
  Administered 2024-01-13: 150 ug via INTRAVENOUS

## 2024-01-13 MED ORDER — BUPIVACAINE HCL (PF) 0.5 % IJ SOLN
INTRAMUSCULAR | Status: AC
Start: 2024-01-13 — End: ?
  Filled 2024-01-13: qty 30

## 2024-01-13 MED ORDER — PROCHLORPERAZINE EDISYLATE 10 MG/2ML IJ SOLN
5.0000 mg | Freq: Once | INTRAMUSCULAR | Status: AC
Start: 2024-01-13 — End: 2024-01-13
  Administered 2024-01-13: 5 mg via INTRAVENOUS

## 2024-01-13 MED ORDER — STERILE WATER FOR INJECTION IJ/IV SOLN (WRAP)
2.0000 g | Freq: Once | INTRAMUSCULAR | Status: AC
Start: 2024-01-13 — End: 2024-01-13
  Administered 2024-01-13: 2 g via INTRAVENOUS
  Filled 2024-01-13: qty 2000

## 2024-01-13 MED ORDER — GLYCOPYRROLATE 0.2 MG/ML IJ SOLN (WRAP)
INTRAMUSCULAR | Status: AC
Start: 2024-01-13 — End: ?
  Filled 2024-01-13: qty 1

## 2024-01-13 MED ORDER — CEFAZOLIN SODIUM 1 G IJ SOLR
INTRAMUSCULAR | Status: DC | PRN
Start: 2024-01-13 — End: 2024-01-13
  Administered 2024-01-13: 2 g via INTRAVENOUS

## 2024-01-13 MED ORDER — IOHEXOL 350 MG/ML IV SOLN
100.0000 mL | Freq: Once | INTRAVENOUS | Status: AC | PRN
Start: 2024-01-13 — End: 2024-01-13
  Administered 2024-01-13: 100 mL via INTRAVENOUS

## 2024-01-13 MED ORDER — LABETALOL HCL 5 MG/ML IV SOLN (WRAP)
10.0000 mg | INTRAVENOUS | Status: DC | PRN
Start: 2024-01-13 — End: 2024-01-13

## 2024-01-13 MED ORDER — PROMETHAZINE HCL 25 MG RE SUPP
12.5000 mg | Freq: Once | RECTAL | Status: DC | PRN
Start: 2024-01-13 — End: 2024-01-13

## 2024-01-13 MED ORDER — FENTANYL CITRATE (PF) 50 MCG/ML IJ SOLN (WRAP)
25.0000 ug | INTRAMUSCULAR | Status: DC | PRN
Start: 2024-01-13 — End: 2024-01-13

## 2024-01-13 MED ORDER — HYDRALAZINE HCL 20 MG/ML IJ SOLN
10.0000 mg | INTRAMUSCULAR | Status: DC | PRN
Start: 2024-01-13 — End: 2024-01-13
  Administered 2024-01-13: 10 mg via INTRAVENOUS
  Filled 2024-01-13: qty 1

## 2024-01-13 MED ORDER — METOCLOPRAMIDE HCL 5 MG/ML IJ SOLN
INTRAMUSCULAR | Status: DC | PRN
Start: 2024-01-13 — End: 2024-01-13
  Administered 2024-01-13: 10 mg via INTRAVENOUS

## 2024-01-13 MED ORDER — HYDROCODONE-ACETAMINOPHEN 5-325 MG PO TABS
1.0000 | ORAL_TABLET | Freq: Once | ORAL | Status: DC | PRN
Start: 2024-01-13 — End: 2024-01-13

## 2024-01-13 MED ORDER — MEPERIDINE HCL 25 MG/ML IJ SOLN
12.5000 mg | INTRAMUSCULAR | Status: DC | PRN
Start: 2024-01-13 — End: 2024-01-13

## 2024-01-13 MED ORDER — SUGAMMADEX SODIUM 200 MG/2ML IV SOLN
INTRAVENOUS | Status: DC | PRN
Start: 2024-01-13 — End: 2024-01-13
  Administered 2024-01-13: 200 mg via INTRAVENOUS

## 2024-01-13 MED ORDER — DEXAMETHASONE SODIUM PHOSPHATE 4 MG/ML IJ SOLN
INTRAMUSCULAR | Status: AC
Start: 2024-01-13 — End: ?
  Filled 2024-01-13: qty 1

## 2024-01-13 MED ORDER — METOCLOPRAMIDE HCL 5 MG/ML IJ SOLN
INTRAMUSCULAR | Status: AC
Start: 2024-01-13 — End: ?
  Filled 2024-01-13: qty 2

## 2024-01-13 MED ORDER — BUPIVACAINE HCL (PF) 0.5 % IJ SOLN
INTRAMUSCULAR | Status: DC | PRN
Start: 2024-01-13 — End: 2024-01-13
  Administered 2024-01-13: 30 mL

## 2024-01-13 MED ORDER — PROPOFOL 10 MG/ML IV EMUL (WRAP)
INTRAVENOUS | Status: DC | PRN
Start: 2024-01-13 — End: 2024-01-13
  Administered 2024-01-13: 100 mg via INTRAVENOUS

## 2024-01-13 MED ORDER — ONDANSETRON HCL 4 MG/2ML IJ SOLN
INTRAMUSCULAR | Status: DC | PRN
Start: 2024-01-13 — End: 2024-01-13
  Administered 2024-01-13: 4 mg via INTRAVENOUS

## 2024-01-13 MED ORDER — ONDANSETRON HCL 4 MG/2ML IJ SOLN
4.0000 mg | Freq: Four times a day (QID) | INTRAMUSCULAR | Status: DC | PRN
Start: 2024-01-13 — End: 2024-01-17
  Filled 2024-01-13: qty 2

## 2024-01-13 SURGICAL SUPPLY — 45 items
APPLICATOR CHLORAPREP 26 ML 70% ISOPROPYL ALCOHOL 2% CHLORHEXIDINE (Applicator) ×2 IMPLANT
APPLICATOR PRP 70% ISPRP 2% CHG 26ML BD (Applicator) ×2 IMPLANT
APPLIER INTERNAL CLIP L29 CM TITANIUM (Endoscopic Supplies) IMPLANT
APPLIER INTERNAL CLIP L29 CM TITANIUM PISTOL GRIP GLARE RESISTANT (Endoscopic Supplies) IMPLANT
APPLIER INTERNAL CLIP MEDIUM LARGE L33 (Staplers) IMPLANT
APPLIER INTERNAL CLIP MEDIUM LARGE L33 CM TITANIUM PISTOL GRIP GLARE (Staplers) IMPLANT
DRAPE SURGICAL ADHESIVE L51 IN X W47 IN (Drape) ×8 IMPLANT
DRAPE SURGICAL ADHESIVE L51 IN X W47 IN STERI-DRAPE CLEAR (Drape) ×8 IMPLANT
DRAPE SURGICAL TOWEL MULTIPURPOSE (Drape) ×8 IMPLANT
DRAPE SURGICAL TOWEL MULTIPURPOSE ADHESIVE LOW GLARE MATTE FINISH L18 (Drape) ×8 IMPLANT
ELECTRODE ADULT PATIENT RETURN L9 FT REM POLYHESIVE ACRYLIC FOAM (Procedure Accessories) ×2 IMPLANT
ELECTRODE PATIENT RETURN L9 FT VALLEYLAB (Procedure Accessories) ×2 IMPLANT
GLOVE SURGICAL 7.5 BIOGEL PI MICRO (Glove) ×4 IMPLANT
GLOVE SURGICAL 7.5 BIOGEL PI MICRO POWDER FREE ROUGH BEAD CUFF (Glove) ×4 IMPLANT
GLOVE SURGICAL 8 BIOGEL PI INDICATOR (Glove) ×2 IMPLANT
GLOVE SURGICAL 8 BIOGEL PI INDICATOR UNDERGLOVE POWDER FREE SMOOTH (Glove) ×2 IMPLANT
IRRIGATOR SUCTION TIP STRYKEFLOW 2 (Respiratory Supplies) IMPLANT
KIT RM TURNOVER LF NS DISP (Kits) ×2 IMPLANT
KIT ROOM TURNOVER NONSTERILE LATEX FREE DISPOSABLE (Kits) ×2 IMPLANT
PACK SURGICAL GENERAL LAP SHARE (Tray) ×2 IMPLANT
PACK SURGICAL GENERAL LAP SHARE MEDLINE INDUSTRIES, INC (Tray) ×2 IMPLANT
POUCH SPECIMEN RETRIEVAL L34.5 CM (Laparoscopy Supplies) IMPLANT
POUCH SPECIMEN RETRIEVAL L34.5 CM ERGONOMIC HANDLE LONG CYLINDRICAL (Laparoscopy Supplies) IMPLANT
SEALER/DIVIDER LAPAROSCOPIC L37 CM 350 D (Laparoscopy Supplies) IMPLANT
SEALER/DIVIDER LAPAROSCOPIC L37 CM 350 D L18.5 MM MARYLAND CURVE JAW (Laparoscopy Supplies) IMPLANT
SET HIGH FLOW SMOKE EVACUATION (Tubing) ×2 IMPLANT
SET HIGH FLOW SMOKE EVACUATION PNEUMOCLEAR TUBING (Tubing) ×2 IMPLANT
SLEEVE LAPSCP UNV EPTH XCL 5MM 100MM (Procedure Accessories) ×2 IMPLANT
SLEEVE TROCAR L100 MM UNIVERSAL STABILITY OD5 MM ENDOPATH XCEL (Procedure Accessories) ×2 IMPLANT
SOLUTION IRRIGATION 0.9% SODIUM CHLORIDE (Irrigation Solutions) ×4 IMPLANT
SOLUTION IRRIGATION 0.9% SODIUM CHLORIDE 1000 ML PLASTIC POUR BOTTLE (Irrigation Solutions) ×2 IMPLANT
SUTURE COATED VICRYL PLUS 0 UR-6 L27 IN (Suture) ×2 IMPLANT
SUTURE COATED VICRYL PLUS 0 UR-6 L27 IN BRAID ANTIBACTERIAL COATED (Suture) ×2 IMPLANT
SUTURE MONOCRYL 4-0 PS-2 L18 IN (Suture) ×2 IMPLANT
SUTURE MONOCRYL 4-0 PS-2 L18 IN MONOFILAMENT UNDYED ABSORBABLE (Suture) ×2 IMPLANT
TRAY CATHETER 1 LAYER FOLEY URINE METER CLOSED SYSTEM PVP SILICONE (Tray) IMPLANT
TRAY CATHETER MEDLINE 1 LAYER FOLEY (Tray) IMPLANT
TROCAR LAPAROSCOPIC BLADELESS STABILITY SLEEVE L100 MM OD12 MM (Laparoscopy Supplies) IMPLANT
TROCAR LAPAROSCOPIC SMOOTH SLEEVE BLUNT TIP OBTURATOR PISTOL HANDLE (Laparoscopy Supplies) IMPLANT
TROCAR LAPAROSCOPIC SMOOTH SLV BLNT  12MM (Laparoscopy Supplies) IMPLANT
TROCAR LAPSCP EPTH XCL 12MM 100MM LF (Laparoscopy Supplies) IMPLANT
TROCAR LAPSCP EPTH XCL 5MM 100MM LF STRL (Laparoscopy Supplies) ×4 IMPLANT
TROCAR OD5 MM L100 MM BLADELESS STABLE SLEEVE ENDOPATH XCEL ENDOSCOPIC (Laparoscopy Supplies) ×4 IMPLANT
WASHCLOTH CLEANSING L8 IN X W8 IN PH (Procedure Accessories) ×2 IMPLANT
WASHCLOTH CLEANSING L8 IN X W8 IN PH BALANCE NEEDLE PUNCH (Procedure Accessories) ×2 IMPLANT

## 2024-01-13 NOTE — ED Provider Notes (Signed)
 EMERGENCY DEPARTMENT HISTORY AND PHYSICAL EXAM     None        Date: 01/13/2024  Patient Name: Roy Delgado.    History of Presenting Illness     Chief Complaint   Patient presents with   . Emesis   . Abdominal Pain       History Provided By: patient    History: Roy Delgado. is a 88 y.o. male presenting to the ED with  severe, acute nonbloody vomiting with associated diffuse cramping nonradiating abdominal pain since 3 AM today.  Patient also notes frequent urination and strong, malodorous smelling urine for the past few days.  No diarrhea or constipation.  No chest pain or shortness of breath.  No fever or chills.  Patient concerned he could have food poisoning.  He had cream of mushroom soup made by his daughter for dinner yesterday.  Daughter had same food without any symptoms.  Daughter gave him 4 doses of Zofran , most recently at 1:30 PM, without any improvement in nausea and vomiting.      PCP: Pcp, None, MD  SPECIALISTS:    Current Medications[1]    Past History     Past Medical History:  Past Medical History:   Diagnosis Date   . Diabetes    . Glaucoma    . Hypertension        Past Surgical History:  Past Surgical History[2]    Family History:  Family History[3]    Social History:  Social History[4]    Allergies:  Allergies[5]    Review of Systems     Review of Systems: All pertinent systems reviewed and negative.         Physical Exam   BP 167/90   Pulse (!) 111   Temp 97.7 F (36.5 C) (Oral)   Resp 22   Ht 6' 1.5 (1.867 m)   Wt 84.8 kg   SpO2 94%   BMI 24.34 kg/m     Constitutional: Vital signs reviewed.  Patient appears uncomfortable.  Head: Normocephalic, atraumatic  Eyes: Conjunctiva and sclera are normal.  No injection or discharge.  Ears, Nose, Throat:  Normal external examination of the nose and ears. No throat or oropharyngeal swelling or erythema. Midline uvula. Mucous membranes moist.  Neck: Normal range of motion. Supple, no meningeal signs. Trachea midline. No stridor. No  JVD  Respiratory/Chest: Clear to auscultation. No respiratory distress.   Cardiovascular: Regular rate and rhythm; tachycardic. No murmurs.  Abdomen:  Bowel sounds intact. No rebound or guarding. Soft.  + Diffuse TTP.  Back: No CVA tenderness to percussion. No focal tenderness.  Upper Extremity:  No edema. No cyanosis. Bilateral radial pulses intact and equal.   Lower Extremity:  No edema. No cyanosis. Bilateral calves symmetrical and non-tender. Bilateral DP, PT pulses intact and equal.  Skin: Warm and dry. No rash.  Neuro: Cranial nerves grossly intact.  Moves all extremities spontaneously.  Psychiatric: Normal affect.  Normal insight.      Diagnostic Study Results     Labs -     Results       Procedure Component Value Units Date/Time    Culture, Blood, Aerobic And Anaerobic [[id number]] Collected: 01/13/24 1743    Specimen: Blood, Venous Updated: 01/13/24 1750    Urinalysis with Reflex to Microscopic Exam and Culture [[id number]]  (Abnormal) Collected: 01/13/24 1633    Specimen: Urine, Clean Catch Updated: 01/13/24 1718     Urine Color Yellow  Urine Clarity Turbid     Urine Specific Gravity 1.033     Urine pH 6.0     Urine Leukocyte Esterase Large     Urine Nitrite Negative     Urine Protein 100= 2+     Urine Glucose 300= 3+     Urine Ketones 10= 1+ mg/dL      Urine Urobilinogen Normal mg/dL      Urine Bilirubin Negative     Urine Blood Small     RBC, UA 26-50 /hpf      Urine WBC Too numerous to count /hpf      Urine Squamous Epithelial Cells 0-5 /hpf      Urine Mucus Present     Urine WBC Clumps Many /hpf     Culture, Urine [[id number]] Collected: 01/13/24 1633    Specimen: Urine, Clean Catch Updated: 01/13/24 1718    Lactic Acid [[id number]]  (Normal) Collected: 01/13/24 1624    Specimen: Blood, Venous Updated: 01/13/24 1658     Lactic Acid 1.8 mmol/L     Urine [employer] Tube [[id number]] Collected: 01/13/24 1633    Specimen: Urine, Clean Catch Updated: 01/13/24 1637    COVID-19 (SARS-CoV-2) and  Influenza A/B, NAA (Liat)- Admission [[id number]]  (Normal) Collected: 01/13/24 1449    Specimen: Swab from Anterior Nares Updated: 01/13/24 1525     SARS-CoV-2 (COVID-19) RNA Not Detected     Influenza A RNA Not Detected     Influenza B RNA Not Detected    Narrative:      A result of Detected indicates POSITIVE for the presence of viral RNA  A result of Not Detected indicates NEGATIVE for the presence of viral RNA    Test performed using the Roche cobas Liat SARS-CoV-2 & Influenza A/B assay. This is a multiplex real-time RT-PCR assay for the detection of SARS-CoV-2, influenza A, and influenza B virus RNA. Viral nucleic acids may persist in vivo, independent of viability. Detection of viral nucleic acid does not imply the presence of infectious virus, or that virus nucleic acid is the cause of clinical symptoms. Negative results do not preclude SARS-CoV-2, influenza A, and/or influenza B infection and should not be used as the sole basis for diagnosis, treatment or other patient management decisions. Invalid results may be due to inhibiting substances in the specimen and recollection should occur.     Comprehensive Metabolic Panel [[id number]]  (Abnormal) Collected: 01/13/24 1447    Specimen: Blood, Venous Updated: 01/13/24 1524     Glucose 240 mg/dL      BUN 26 mg/dL      Creatinine 1.2 mg/dL      Sodium 861 mEq/L      Potassium 4.5 mEq/L      Chloride 102 mEq/L      CO2 25 mEq/L      Calcium 9.3 mg/dL      Anion Gap 88.9     GFR 59.3 mL/min/1.73 m2      AST (SGOT) 14 U/L      ALT <6 U/L      Alkaline Phosphatase 159 U/L      Albumin  3.5 g/dL      Protein, Total 6.9 g/dL      Globulin 3.4 g/dL      Albumin /Globulin Ratio 1.0     Bilirubin, Total 0.6 mg/dL     Lipase [[id number]]  (Normal) Collected: 01/13/24 1447    Specimen: Blood, Venous Updated: 01/13/24 1524     Lipase 50 U/L  CBC with Differential (Order) [[id number]] Collected: 01/13/24 1447    Specimen: Blood, Venous Updated: 01/13/24 1506     Narrative:      The following orders were created for panel order CBC with Differential (Order).  Procedure                               Abnormality         Status                     ---------                               -----------         ------                     CBC with Differential (...[[id number]]                      Final result                 Please view results for these tests on the individual orders.    CBC with Differential (Component) [[id number]] Collected: 01/13/24 1447    Specimen: Blood, Venous Updated: 01/13/24 1506     WBC 7.18 x10 3/uL      Hemoglobin 12.6 g/dL      Hematocrit 61.9 %      Platelet Count 327 x10 3/uL      MPV 10.8 fL      RBC 4.36 x10 6/uL      MCV 87.2 fL      MCH 28.9 pg      MCHC 33.2 g/dL      RDW 15 %      nRBC % 0.0 /100 WBC      Absolute nRBC 0.00 x10 3/uL      Preliminary Absolute Neutrophil Count 5.75 x10 3/uL      Neutrophils % 80.2 %      Lymphocytes % 9.7 %      Monocytes % 7.2 %      Eosinophils % 2.1 %      Basophils % 0.7 %      Immature Granulocytes % 0.1 %      Absolute Neutrophils 5.75 x10 3/uL      Absolute Lymphocytes 0.70 x10 3/uL      Absolute Monocytes 0.52 x10 3/uL      Absolute Eosinophils 0.15 x10 3/uL      Absolute Basophils 0.05 x10 3/uL      Absolute Immature Granulocytes 0.01 x10 3/uL     Light Landy GLENWOOD Connor PST Hold Tube [[id number]] Collected: 01/13/24 1448    Specimen: Blood, Venous Updated: 01/13/24 1503    Rainbow Draw [[id number]] Collected: 01/13/24 1448    Specimen: Blood, Venous Updated: 01/13/24 1503    Narrative:      The following orders were created for panel order Rainbow Draw.  Procedure                               Abnormality         Status                     ---------                               -----------         ------  Light Green - LiHep PST.SABRASABRA[[id number]]                      In process                 Light Blue - Citrate Ho.SABRA.[[id number]]                      In process                   Please  view results for these tests on the individual orders.    Light Blue - Citrate Hold Tube [[id number]] Collected: 01/13/24 1448    Specimen: Blood, Venous Updated: 01/13/24 1503            Radiologic Studies -   Radiology Results (24 Hour)       Procedure Component Value Units Date/Time    XR Chest AP Portable [[id number]] Collected: 01/13/24 1738    Order Status: Completed Updated: 01/13/24 1742    Narrative:      HISTORY: Status post nasogastric tube placement.     COMPARISON: 07/25/2010    FINDINGS:   Nasogastric tube tip projects over the proximal stomach. Mild reticulation  of the lung bases, better demonstrated on recent CT. Cardiac and  mediastinal contours within normal limits in size.      Impression:        Nasogastric tube terminates in the proximal stomach    Alphonza JONETTA Cohn, MD  01/13/2024 5:39 PM    CT Abd/Pelvis with IV Contrast [[id number]] Collected: 01/13/24 1605    Order Status: Completed Updated: 01/13/24 1617    Narrative:      HISTORY: Diffuse abdominal pain and vomiting for one day.    COMPARISON: None available.    TECHNIQUE: CT of the abdomen and pelvis performed with intravenous  contrast. The following dose reduction techniques were utilized: automated  exposure control and/or adjustment of the mA and/or KV according to patient  size, and the use of an iterative reconstruction technique.    CONTRAST: iohexol  (OMNIPAQUE ) 350 MG/ML injection 100 mL    FINDINGS:  Dilated fluid-filled stomach with dilated duodenum and proximal jejunum.  Caliber transition point is seen in the proximal jejunum near the root of  the mesentery which has a slightly swirled appearance. Remainder of small  bowel is nondilated. Colon is appropriately positioned with large stool  volume. No free fluid or fluid collection.    Small hepatic cysts. Spleen and adrenal glands appear unremarkable.  Pancreas appears atrophic. Gallbladder is present with no surrounding  inflammation or biliary dilatation. Portal vein is patent.  Kidneys enhance  symmetrically with no hydronephrosis. Nonobstructive left renal calculi and  small renal cysts. Atherosclerotic calcification of the abdominal aorta  without aneurysm. No enlarged lymph nodes identified. Bladder appears  unremarkable. Osteopenia and degenerative changes in the spine. Pulmonary  fibrosis and bronchiectasis in the lung bases.      Impression:          1. Dilated fluid-filled stomach and duodenum with obstruction in the  proximal jejunum where there is slight swirling at the root of the  mesentery suspicious for midgut volvulus.  2. Nonobstructive left renal calculi and small renal cysts.    COMMENT: This urgent result was discussed with and acknowledged by Dr.  Von, at 1612 hours on 01/13/2024.    Levada Cinnamon, MD  01/13/2024 4:15 PM        .  Medical Decision Making   I am the first provider for this patient.    I reviewed the vital signs, available nursing notes, medical records, past medical history, past surgical history, family history and social history.    Vital Signs-Reviewed the patient's vital signs.   Patient Vitals for the past 12 hrs:   BP Temp Pulse Resp   01/13/24 1605 167/90 -- (!) 111 22   01/13/24 1505 130/88 -- (!) 108 --   01/13/24 1500 130/88 -- (!) 106 20   01/13/24 1429 115/72 97.7 F (36.5 C) (!) 109 18         EKG:  Interpreted by me, the EP.   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    422- gen surg at [facility] messaged    442 - transfer line messaged, they will attempt to contact general surgery    532 - d/aw Dr. Soyla, gen surg on call. He would like to discuss case and treatment options personally with patient. Admit to medicine    545 -using shared decision-making, Dr. Soyla and patient and patient's daughter have determined the patient will go for  stat exploratory laparotomy.  He will subsequently be admitted to medicine.  Dr. Soyla agrees with dose of ceftriaxone  and Flagyl in the ED.    556 - d/aw Dr. Phillips, accepts to level 4 bed. Upgrade as needed depending on pt's status after surgery.    Provider Notes: CT scan of abdomen concerning for acute bowel obstruction with possible volvulus.  No leukocytosis.  Normal lactate.  Urinalysis also concerning for UTI with pyuria.  Patient had NG tube placed with over 600 cc of brownish fluid removed from stomach and relief of pain.  He also received IV morphine  and IV Compazine .  He received dose of IV Rocephin  for pyelonephritis.            Diagnosis     Clinical Impression:   1. Intestinal volvulus    2. Acute UTI        Treatment Plan:   ED Disposition       ED Disposition   Admit    Condition   --    Date/Time   Mon Jan 13, 2024  5:54 PM    Comment   Admitting Physician: PHILLIPS JEST ALI [[id number]]   Service:: Medicine [[id number]]   Estimated Length of Stay: > or = to 2 midnights   Tentative Discharge Plan?: Home or Self Care [1]   Does patient need telemetry?: No                   _______________________________    This note was generated by the Epic EMR system/ Dragon speech recognition and may contain inherent errors or omissions not intended by the user. Grammatical errors, random word insertions, deletions and pronoun errors  are occasional consequences of this technology due to software limitations. Not all errors are caught or corrected. If there are questions or concerns about the content of this note or information contained within the body of this dictation they should be addressed directly with the author for clarification.      Attestations: This note is prepared by Cletis Sor, MD    _______________________________  Von Cletis RAMAN, MD  01/13/24 1757         [1]  No current facility-administered medications for this encounter.     Current Outpatient Medications   Medication Sig Dispense Refill   .  Brimonidine Tartrate-Timolol  (COMBIGAN OP) Apply 1 drop to eye 2 (two) times daily.     . insulin  glargine (LANTUS ) 100 UNIT/ML injection Inject 27 Units into the skin daily     . Insulin  Lispro (HUMALOG  SC) Inject 15 Units into the skin 3 (three) times daily.     SABRA levocetirizine (XYZAL) 2.5 MG/5ML solution Take 5 mLs (2.5 mg) by mouth every evening     . losartan  (COZAAR ) 25 MG tablet Take 1 tablet (25 mg) by mouth daily     . metoprolol  succinate XL (TOPROL -XL) 25 MG 24 hr tablet Take 1 tablet (25 mg) by mouth daily     . montelukast  (SINGULAIR ) 10 MG tablet Take 1 tablet (10 mg) by mouth nightly     . pregabalin  (LYRICA ) 100 MG capsule Take 1 capsule (100 mg) by mouth 2 (two) times daily     . colchicine  0.6 MG tablet Take 1 tab by mouth twice daily for 5 days then reduce to 1 tab daily. F/u with PCP 30 tablet 0   . hydrochlorothiazide  (HYDRODIURIL ) 25 MG tablet Take 25 mg by mouth daily.     SABRA olmesartan (BENICAR) 40 MG tablet Take 40 mg by mouth daily.     [2]  Past Surgical History:  Procedure Laterality Date   . appendectomy 1991     [3]  Family History  Problem Relation Name Age of Onset   . Colon cancer Father     [4]  Social History  Tobacco Use   . Smoking status: Never   . Smokeless tobacco: Never   Substance Use Topics   . Alcohol use: Yes     Alcohol/week: 0.0 standard drinks of alcohol     Comment: 2 drinks a month   . Drug use: No   [5]  No Known Allergies

## 2024-01-13 NOTE — ED to IP RN Note (Signed)
 [facility]  ED NURSING NOTE FOR THE RECEIVING INPATIENT NURSE   ED NURSE Lani Havlik   S[employer]AL[employer] [phone]   ED CHARGE RN Sabrina   ADMISSION INFORMATION   Douglass Dunshee Gustavus Haskin. is a 88 y.o. male admitted with an ED diagnosis of:    No diagnosis found.     Isolation: None   Allergies: Patient has no known allergies.   Holding Orders confirmed? N/A   Belongings Documented? Yes   Home medications sent to pharmacy confirmed? N/A   NURSING CARE   Patient Comes From:   Mental Status: Home/Family Care  alert and oriented   ADL: Needs assistance with ADLs   Ambulation: 2 person assist   Pertinent Information  and Safety Concerns:     Broset Violence Risk Level: Low Bowel obstruction with 47fr NG to R nare at 70cm. AAOx3. Assist with transfers.      CT / NIH   CT Head ordered on this patient?  N/A   NIH/Dysphagia assessment done prior to admission? N/A   VITAL SIGNS (at the time of this note)      Vitals:    01/13/24 1605   BP: 167/90   Pulse: (!) 111   Resp: 22   Temp:    SpO2: 94%     Pain Score: 4-moderate pain (01/13/24 1600)

## 2024-01-13 NOTE — Op Note (Signed)
 FULL OPERATIVE NOTE    Date Time: 01/13/24 10:18 PM  Patient Name: Roy Delgado, Roy Delgado (MRN: [id number])  Attending Physician: Phillips Adriana Lash, MD      Date of Operation:   01/13/2024    Providers Performing:   Surgeons and Role:     * Eveline Sauve, Dallas BROCKS, DO - Primary    Surgical First Assistant(s):   First Assistant: Maida Gilmer KIDD, RN    A first assistant was necessary to aid me in patient positioning, operative field retraction & exposure, minimally invasive procedure camera navigation, port manipulation, instrument and equipment exchanges, aiding in incision closure and dressing application, any other operative tasks required for patient safety. There was no qualified resident available for this procedure.      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 Found to have imaging findings supportive of proximal small bowel volvulus with swirling of vessels and obstruction. Owing to high risk of morbidity, he consented to undergo exploration.     Operative Notes:     After appropriate consent was obtained, the patient was brought to the operating room and placed in the supine position. With continuous pulse oximetry and cardiac monitoring, and with sequential compression devices placed, the  patient was given a general anesthetic and prepped and draped in the usual sterile fashion.     Following the surgical pause, a right upper quadrant incision was made and the abdomen entered under direct vision using optiview 5mm trocar. Pneumoperitoneum was created. The underlying field was observed and found to be without injury. Survey revealed greater than expected upper abdominal, lower abdominal, and midline adhesive disease with small bowel adherent to the anterior abdominal wall. A window was noted caudal to the camera port and an additional 5mm trocar was placed. We proceeded to meticulously take down the adhesion tethering bowel to the abdominal wall. Interloop adhesions were also divided sharply taking great care to avoid injury to viscera. A window to the contralateral side was made, and we were able to place two 5mm trocars on the left side of the abdomen. Using this access, we continued to mobilize the bowel through adhesiolysis. Overall, this process required greater physical effort and mental focus than would normally be required and was technically very complex. In the upper abdomen, at the area corresponding to the CT images, we appreciated some isolated bands causing small bowel volvulus which was noted to herniate through a defect created by further adhesive scar tissue. The internal hernia was taken down by lysing the scar tissue and then the volvulized bowel was detorsed. It appeared hyperemic and edematous but was fortunately not ischemic. The thick band around which the volvulus had occurred was also divided with  ligasure. The bowel was run from ligament of treitz through the restored and released segment, and distal down to the distal small bowel. The remainder of the distal small bowel was decompressed and uninvolved. There were some remaining adhesions of small bowel to a prior midline scar which were densely involved into previously placed suture. Since they were not contributory to his  obstruction, and taking them down would likely require conversion to open and bowel resection due to the ingrowth of the bowel into the sutures and fascia - this segment of bowel was left alone. The bowel was run again and the enteral contents were now found to freely travel distal past the prior point of obstruction. We then released the pneumoperitoneum and removed our trocars.     Local anesthetic was injected and the skin incisions were closed with 4-0 Monocryl subcuticular sutures and surgical sealant. The sponge and needle count were correct, and a surgical debrief was performed. The patient tolerated the procedure well and left for the recovery room in good condition.                Estimated Blood Loss:   5 mL    Implants:   * No implants in log *       Drains:   Drains: no      Specimens:   None      Complications:   None    Signed by: Dallas BROCKS Jalexia Lalli, DO  [facility]

## 2024-01-13 NOTE — Anesthesia Postprocedure Evaluation (Signed)
 Anesthesia Post Evaluation    Patient: Roy W Aldridge Jr.    Procedure(s):  LAPAROSCOPY, DIAGNOSTIC, LYSIS OF ADHESIONS    Anesthesia type: general    Last Vitals:   Vitals Value Taken Time   BP 163/64 01/13/24 2200   Temp 36.3 C (97.4 F) 01/13/24 2124   Pulse 75 01/13/24 2200   Resp 18 01/13/24 2200   SpO2 95 % 01/13/24 2200                 Anesthesia Post Evaluation:     Patient Evaluated: PACU    Level of Consciousness: awake and alert  Pain Score: 3  Pain Management: adequate  Multimodal analgesia pain management approach  Strategies: local anesthesia  Airway Patency: patent        Anesthetic complications: No      PONV Status: none    Cardiovascular status: acceptable  Respiratory status: acceptable  Hydration status: acceptable          Signed by: Jackquelyn DELENA Ernst, MD, 01/13/2024 10:11 PM

## 2024-01-13 NOTE — ED Notes (Signed)
 Daughter (nicole) called and aware of transfer to [facility]. Pt family member to meet patient at [facility].

## 2024-01-13 NOTE — ED Notes (Signed)
[phone]

## 2024-01-13 NOTE — Anesthesia Preprocedure Evaluation (Addendum)
 Anesthesia Evaluation    AIRWAY    Mallampati: II    Neck ROM: full  Mouth Opening:full  Planned to use difficult airway equipment: No CARDIOVASCULAR    normal       DENTAL    no notable dental hx               PULMONARY         OTHER FINDINGS                                    Relevant Problems   No relevant active problems               Anesthesia Plan    ASA 3 - emergent     general               (        Echo 2022  INTERPRETATION   MILD LV SYSTOLIC DYSFUNCTION (See above)   NORMAL RIGHT VENTRICULAR SYSTOLIC FUNCTION   MILD VALVULAR REGURGITATION (See above)   NO VALVULAR STENOSIS   Focal wall motion abnormalities       2022 stress test  1.  Mildly reduced left ventricular function   2.  Anteroapical and inferoapical akinesis   3.  Moderate inferoapical and septal ischemia   )      intravenous induction   Detailed anesthesia plan: general endotracheal      Post Op:  Trial extubation is planned.    Post op pain management: per surgeon    informed consent obtained    Plan discussed with CRNA.    ECG reviewed  pertinent labs reviewed             Signed by: Belvie Cradle, MD 01/13/24 6:25 PM

## 2024-01-13 NOTE — ED Notes (Signed)
 Report to RN Gingo in ER at [facility]. Report given to MMT Donnice

## 2024-01-13 NOTE — Progress Notes (Signed)
 Phase 1 discharge criteria met. Patient is awake and alert. Pain level is controlled to tolerable level. Respirations easy and non labored. All vital sighs remain stable. Surgical dressing abdomen clean dry and intact. Telephone report given to [facility] format to elvis RN. Pt transported via bed, escorted by transportation services to room 2614. Pt resting comfortably in bed with no signs of pain or  distress noted prior to leaving PACU.

## 2024-01-13 NOTE — ED Notes (Signed)
 Pt transferred to [facility] by MMT. Resting comfortably, NG tube secured. Aaox3. Family members aware. RN gingo called in ED and made aware of patient.

## 2024-01-13 NOTE — ED Notes (Addendum)
 Last meal 1100  Attempted to call OR, [facility] OR attempting to get PreOp nurses to OR

## 2024-01-13 NOTE — Consults (Signed)
 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 I have further communicated my findings and plan of care directly with the managing care team.     I interpreted his CT images, noting the appreciable mesenteric swirling of vessels and upper small bowel obstruction concerning for volvulus. Owing to high risk of further morbidity, we Nikoli Nasser proceed with surgical exploration.     Discussed with daughter and patient who agree to proceed.       Plan:     - To OR emergently for diagnostic laparoscopy, possible laparotomy  - Preop antibiotics  - NPO, IVF, NGT      History of Present Illness:   Roy Delgado. is a 88 y.o. male who  has a past medical history of Diabetes, Glaucoma, and Hypertension..      [facility] with  severe, acute nonbloody vomiting with associated diffuse cramping nonradiating abdominal pain since 3 AM today.  Patient also notes frequent urination and strong, malodorous smelling urine for the past few days.  No diarrhea or constipation.  No chest pain or shortness of breath.  No fever or chills.  Patient concerned he could have food poisoning.  He had cream of mushroom soup made by his daughter for dinner yesterday.  Daughter had same food without any symptoms.  Daughter gave him 4 doses of Zofran , most recently at 1:30 PM, without any improvement in nausea and vomiting.     Past Medical History:     Past Medical History:   Diagnosis Date    Diabetes     Glaucoma     Hypertension         Past Surgical History:   Past Surgical History[1]    Family History:   Family History[2]    Social History:     Social History     Socioeconomic History    Marital status: Married     Spouse name: Not on file    Number of children: Not on file    Years of education: Not on file    Highest education level: Not on file   Occupational History    Not on file   Tobacco Use    Smoking status: Never    Smokeless tobacco: Never   Substance and Sexual Activity    Alcohol use: Yes     Alcohol/week: 0.0 standard drinks of alcohol     Comment: 2 drinks a month    Drug use: No    Sexual activity: Not on file   Other Topics Concern    Not on file   Social History Narrative    Not on file     Social Drivers of Health     Financial Resource Strain: Low Risk  (02/28/2023)    Received from [facility]    Overall Financial Resource Strain (CARDIA)     Difficulty of Paying Living Expenses:  Not hard at all   Food Insecurity: No Food Insecurity (01/13/2024)    Hunger Vital Sign     Worried About Running Out of Food in the Last Year: Never true     Ran Out of Food in the Last Year: Never true   Transportation Needs: No Transportation Needs (01/13/2024)    PRAPARE - Therapist, Art (Medical): No     Lack of Transportation (Non-Medical): No   Physical Activity: Unknown (11/04/2017)    Received from [facility]    Exercise Vital Sign     Days of Exercise per Week: Patient declined     Minutes of Exercise per Session: Patient declined   Stress: No Stress Concern Present (11/04/2017)    Received from [facility] - Occupational Stress Questionnaire     Feeling of Stress : Not at all   Social Connections: Unknown (11/04/2017)    Received from [facility]    Social Connection and Isolation Panel [NHANES]     Frequency of Communication with Friends and Family: Patient declined     Frequency of Social  Gatherings with Friends and Family: Patient declined     Attends Religious Services: Patient declined     Database Administrator or Organizations: Patient declined     Attends Banker Meetings: Patient declined     Marital Status: Patient declined   Intimate Partner Violence: Not At Risk (01/13/2024)    Humiliation, Afraid, Rape, and Kick questionnaire     Fear of Current or Ex-Partner: No     Emotionally Abused: No     Physically Abused: No     Sexually Abused: No   Housing Stability: Low Risk  (01/13/2024)    Housing Stability Vital Sign     Unable to Pay for Housing in the Last Year: No     Number of Times Moved in the Last Year: 0     Homeless in the Last Year: No       Allergies:   Allergies[3]    Medications:     Prior to Admission medications    Medication Sig Start Date End Date Taking? Authorizing Provider   Brimonidine Tartrate-Timolol  (COMBIGAN OP) Apply 1 drop to eye 2 (two) times daily.   Yes [provider]   insulin  glargine (LANTUS ) 100 UNIT/ML injection Inject 27 Units into the skin daily   Yes [provider]   Insulin  Lispro (HUMALOG  SC) Inject 15 Units into the skin 3 (three) times daily.   Yes [provider]   levocetirizine (XYZAL) 2.5 MG/5ML solution Take 5 mLs (2.5 mg) by mouth every evening   Yes [provider]   losartan  (COZAAR ) 25 MG tablet Take 1 tablet (25 mg) by mouth daily   Yes [provider]   metoprolol  succinate XL (TOPROL -XL) 25 MG 24 hr tablet Take 1 tablet (25 mg) by mouth daily   Yes [provider]   montelukast  (SINGULAIR ) 10 MG tablet Take 1 tablet (10 mg) by mouth nightly   Yes [provider]   pregabalin  (LYRICA ) 100 MG capsule Take 1 capsule (100 mg) by mouth 2 (two) times daily   Yes [provider]   colchicine  0.6 MG tablet Take 1 tab by mouth twice daily for 5 days then reduce to 1 tab daily. F/u with PCP 03/14/16   Freida Schwalbe, MD  hydrochlorothiazide  (HYDRODIURIL ) 25 MG  tablet Take 25 mg by mouth daily.    [provider]   olmesartan (BENICAR) 40 MG tablet Take 40 mg by mouth daily.    [provider]       Review of Systems:   As per the HPI.  The patient denies any additional changes to their otic, opthalmologic, dermatologic, pulmonary, cardiac, gastrointestinal, genitourinary, musculoskeletal, hematologic, constitutional, or psychiatric systems.      Physical Exam:     Vitals:    01/13/24 1914   BP: 164/86   Pulse: 78   Resp: 16   Temp: 98.4 F (36.9 C)   SpO2: 97%       Gen: Uncomfortable appearing  Head: normocephalic  Eyes: nml conjunctivae  Neck: supple  Chest: normal effort  CV: RRR  Abd: soft, distended, tender to palpation in epigastrum  Extr: warm, no edema  Skin: no rashes or lesions  Neuro: A&O, nonfocal      Labs:     Results       Procedure Component Value Units Date/Time    Whole Blood Glucose POCT [[id number]]  (Abnormal) Collected: 01/13/24 1909    Specimen: Blood, Capillary Updated: 01/13/24 1918     Whole Blood Glucose POCT 299 mg/dL     Culture, Blood, Aerobic And Anaerobic [[id number]] Collected: 01/13/24 1743    Specimen: Blood, Venous Updated: 01/13/24 1750    Urinalysis with Reflex to Microscopic Exam and Culture [[id number]]  (Abnormal) Collected: 01/13/24 1633    Specimen: Urine, Clean Catch Updated: 01/13/24 1718     Urine Color Yellow     Urine Clarity Turbid     Urine Specific Gravity 1.033     Urine pH 6.0     Urine Leukocyte Esterase Large     Urine Nitrite Negative     Urine Protein 100= 2+     Urine Glucose 300= 3+     Urine Ketones 10= 1+ mg/dL      Urine Urobilinogen Normal mg/dL      Urine Bilirubin Negative     Urine Blood Small     RBC, UA 26-50 /hpf      Urine WBC Too numerous to count /hpf      Urine Squamous Epithelial Cells 0-5 /hpf      Urine Mucus Present     Urine WBC Clumps Many /hpf     Culture, Urine [[id number]] Collected: 01/13/24 1633    Specimen: Urine, Clean Catch Updated: 01/13/24 1718    Lactic Acid  [[id number]]  (Normal) Collected: 01/13/24 1624    Specimen: Blood, Venous Updated: 01/13/24 1658     Lactic Acid 1.8 mmol/L     Urine [employer] Tube [[id number]] Collected: 01/13/24 1633    Specimen: Urine, Clean Catch Updated: 01/13/24 1637    COVID-19 (SARS-CoV-2) and Influenza A/B, NAA (Liat)- Admission [[id number]]  (Normal) Collected: 01/13/24 1449    Specimen: Swab from Anterior Nares Updated: 01/13/24 1525     SARS-CoV-2 (COVID-19) RNA Not Detected     Influenza A RNA Not Detected     Influenza B RNA Not Detected    Narrative:      A result of Detected indicates POSITIVE for the presence of viral RNA  A result of Not Detected indicates NEGATIVE for the presence of viral RNA    Test performed using the Roche cobas Liat SARS-CoV-2 & Influenza A/B assay. This is a multiplex real-time RT-PCR assay for the detection of SARS-CoV-2, influenza A, and  influenza B virus RNA. Viral nucleic acids may persist in vivo, independent of viability. Detection of viral nucleic acid does not imply the presence of infectious virus, or that virus nucleic acid is the cause of clinical symptoms. Negative results do not preclude SARS-CoV-2, influenza A, and/or influenza B infection and should not be used as the sole basis for diagnosis, treatment or other patient management decisions. Invalid results may be due to inhibiting substances in the specimen and recollection should occur.     Comprehensive Metabolic Panel [[id number]]  (Abnormal) Collected: 01/13/24 1447    Specimen: Blood, Venous Updated: 01/13/24 1524     Glucose 240 mg/dL      BUN 26 mg/dL      Creatinine 1.2 mg/dL      Sodium 861 mEq/L      Potassium 4.5 mEq/L      Chloride 102 mEq/L      CO2 25 mEq/L      Calcium 9.3 mg/dL      Anion Gap 88.9     GFR 59.3 mL/min/1.73 m2      AST (SGOT) 14 U/L      ALT <6 U/L      Alkaline Phosphatase 159 U/L      Albumin  3.5 g/dL      Protein, Total 6.9 g/dL      Globulin 3.4 g/dL      Albumin /Globulin Ratio 1.0      Bilirubin, Total 0.6 mg/dL     Lipase [[id number]]  (Normal) Collected: 01/13/24 1447    Specimen: Blood, Venous Updated: 01/13/24 1524     Lipase 50 U/L     CBC with Differential (Order) [[id number]] Collected: 01/13/24 1447    Specimen: Blood, Venous Updated: 01/13/24 1506    Narrative:      The following orders were created for panel order CBC with Differential (Order).  Procedure                               Abnormality         Status                     ---------                               -----------         ------                     CBC with Differential (...[[id number]]                      Final result                 Please view results for these tests on the individual orders.    CBC with Differential (Component) [[id number]] Collected: 01/13/24 1447    Specimen: Blood, Venous Updated: 01/13/24 1506     WBC 7.18 x10 3/uL      Hemoglobin 12.6 g/dL      Hematocrit 61.9 %      Platelet Count 327 x10 3/uL      MPV 10.8 fL      RBC 4.36 x10 6/uL      MCV 87.2 fL      MCH 28.9 pg      MCHC 33.2 g/dL      RDW 15 %  nRBC % 0.0 /100 WBC      Absolute nRBC 0.00 x10 3/uL      Preliminary Absolute Neutrophil Count 5.75 x10 3/uL      Neutrophils % 80.2 %      Lymphocytes % 9.7 %      Monocytes % 7.2 %      Eosinophils % 2.1 %      Basophils % 0.7 %      Immature Granulocytes % 0.1 %      Absolute Neutrophils 5.75 x10 3/uL      Absolute Lymphocytes 0.70 x10 3/uL      Absolute Monocytes 0.52 x10 3/uL      Absolute Eosinophils 0.15 x10 3/uL      Absolute Basophils 0.05 x10 3/uL      Absolute Immature Granulocytes 0.01 x10 3/uL     Light Landy GLENWOOD Connor PST Hold Tube [[id number]] Collected: 01/13/24 1448    Specimen: Blood, Venous Updated: 01/13/24 1503    Rainbow Draw [[id number]] Collected: 01/13/24 1448    Specimen: Blood, Venous Updated: 01/13/24 1503    Narrative:      The following orders were created for panel order Rainbow Draw.  Procedure                               Abnormality         Status                      ---------                               -----------         ------                     Sharie Landy - LiHep PST.SABRASABRA[[id number]]                      In process                 Light Blue - Citrate Ho.SABRA.[[id number]]                      In process                   Please view results for these tests on the individual orders.    Light Blue - Citrate Hold Tube [[id number]] Collected: 01/13/24 1448    Specimen: Blood, Venous Updated: 01/13/24 1503            Rads:     Radiology Results (24 Hour)       Procedure Component Value Units Date/Time    XR Chest AP Portable [[id number]] Collected: 01/13/24 1738    Order Status: Completed Updated: 01/13/24 1742    Narrative:      HISTORY: Status post nasogastric tube placement.     COMPARISON: 07/25/2010    FINDINGS:   Nasogastric tube tip projects over the proximal stomach. Mild reticulation  of the lung bases, better demonstrated on recent CT. Cardiac and  mediastinal contours within normal limits in size.      Impression:        Nasogastric tube terminates in the proximal stomach    Alphonza JONETTA Cohn, MD  01/13/2024 5:39 PM    CT Abd/Pelvis with IV Contrast [[id number]] Collected: 01/13/24  1605    Order Status: Completed Updated: 01/13/24 1617    Narrative:      HISTORY: Diffuse abdominal pain and vomiting for one day.    COMPARISON: None available.    TECHNIQUE: CT of the abdomen and pelvis performed with intravenous  contrast. The following dose reduction techniques were utilized: automated  exposure control and/or adjustment of the mA and/or KV according to patient  size, and the use of an iterative reconstruction technique.    CONTRAST: iohexol  (OMNIPAQUE ) 350 MG/ML injection 100 mL    FINDINGS:  Dilated fluid-filled stomach with dilated duodenum and proximal jejunum.  Caliber transition point is seen in the proximal jejunum near the root of  the mesentery which has a slightly swirled appearance. Remainder of small  bowel is nondilated. Colon is appropriately positioned  with large stool  volume. No free fluid or fluid collection.    Small hepatic cysts. Spleen and adrenal glands appear unremarkable.  Pancreas appears atrophic. Gallbladder is present with no surrounding  inflammation or biliary dilatation. Portal vein is patent. Kidneys enhance  symmetrically with no hydronephrosis. Nonobstructive left renal calculi and  small renal cysts. Atherosclerotic calcification of the abdominal aorta  without aneurysm. No enlarged lymph nodes identified. Bladder appears  unremarkable. Osteopenia and degenerative changes in the spine. Pulmonary  fibrosis and bronchiectasis in the lung bases.      Impression:          1. Dilated fluid-filled stomach and duodenum with obstruction in the  proximal jejunum where there is slight swirling at the root of the  mesentery suspicious for midgut volvulus.  2. Nonobstructive left renal calculi and small renal cysts.    COMMENT: This urgent result was discussed with and acknowledged by Dr.  Von, at 1612 hours on 01/13/2024.    Levada Cinnamon, MD  01/13/2024 4:15 PM              Signed by:    Dallas BROCKS. Anniemae Haberkorn, DO    I spent 70 minutes in chart review, image and lab interpretation, patient interview and exam, on education regarding the underlying diagnosis and pathophysiology, and developing a plan of care. >50% of the time spent was in providing direct counseling.          [1]   Past Surgical History:  Procedure Laterality Date    appendectomy 1991     [2]   Family History  Problem Relation Name Age of Onset    Colon cancer Father     [3] No Known Allergies

## 2024-01-14 ENCOUNTER — Encounter: Payer: Self-pay | Admitting: Student in an Organized Health Care Education/Training Program

## 2024-01-14 DIAGNOSIS — N3090 Cystitis, unspecified without hematuria: Secondary | ICD-10-CM | POA: Diagnosis present

## 2024-01-14 LAB — RENAL FUNCTION PANEL
Albumin: 3.2 g/dL — ABNORMAL LOW (ref 3.5–5.0)
Anion Gap: 18 — ABNORMAL HIGH (ref 5.0–15.0)
BUN: 34 mg/dL — ABNORMAL HIGH (ref 9–28)
CO2: 19 meq/L (ref 17–29)
Calcium: 8.8 mg/dL (ref 7.9–10.2)
Chloride: 103 meq/L (ref 99–111)
Creatinine: 1.3 mg/dL (ref 0.5–1.5)
GFR: 53.8 mL/min/{1.73_m2} — ABNORMAL LOW (ref >=60.0)
Glucose: 433 mg/dL — ABNORMAL HIGH (ref 70–100)
Phosphorus: 4.6 mg/dL (ref 2.3–4.7)
Potassium: 5.1 meq/L (ref 3.5–5.3)
Sodium: 140 meq/L (ref 135–145)

## 2024-01-14 LAB — WHOLE BLOOD GLUCOSE POCT
Whole Blood Glucose POCT: 299 mg/dL — ABNORMAL HIGH (ref 70–100)
Whole Blood Glucose POCT: 416 mg/dL — ABNORMAL HIGH (ref 70–100)
Whole Blood Glucose POCT: 327 mg/dL — ABNORMAL HIGH (ref 70–100)
Whole Blood Glucose POCT: 260 mg/dL — ABNORMAL HIGH (ref 70–100)
Whole Blood Glucose POCT: 147 mg/dL — ABNORMAL HIGH (ref 70–100)
Whole Blood Glucose POCT: 147 mg/dL — ABNORMAL HIGH (ref 70–100)

## 2024-01-14 LAB — LAB USE ONLY - CBC WITH DIFFERENTIAL
Absolute Basophils: 0.03 10*3/uL (ref 0.00–0.08)
Absolute Eosinophils: 0.01 10*3/uL (ref 0.00–0.44)
Absolute Immature Granulocytes: 0.06 10*3/uL (ref 0.00–0.07)
Absolute Lymphocytes: 0.58 10*3/uL (ref 0.42–3.22)
Absolute Monocytes: 0.92 10*3/uL — ABNORMAL HIGH (ref 0.21–0.85)
Absolute Neutrophils: 10.85 10*3/uL — ABNORMAL HIGH (ref 1.10–6.33)
Absolute nRBC: 0 10*3/uL (ref <=0.00)
Basophils %: 0.2 %
Eosinophils %: 0.1 %
Hematocrit: 35.1 % — ABNORMAL LOW (ref 37.6–49.6)
Hemoglobin: 10.8 g/dL — ABNORMAL LOW (ref 12.5–17.1)
Immature Granulocytes %: 0.5 %
Lymphocytes %: 4.7 %
MCH: 28.3 pg (ref 25.1–33.5)
MCHC: 30.8 g/dL — ABNORMAL LOW (ref 31.5–35.8)
MCV: 92.1 fL (ref 78.0–96.0)
MPV: 11.3 fL (ref 8.9–12.5)
Monocytes %: 7.4 %
Neutrophils %: 87.1 %
Platelet Count: 272 10*3/uL (ref 142–346)
Preliminary Absolute Neutrophil Count: 10.85 10*3/uL — ABNORMAL HIGH (ref 1.10–6.33)
RBC: 3.81 10*6/uL — ABNORMAL LOW (ref 4.20–5.90)
RDW: 15 % (ref 11–15)
WBC: 12.45 10*3/uL — ABNORMAL HIGH (ref 3.10–9.50)
nRBC %: 0 /100{WBCs} (ref <=0.0)

## 2024-01-14 LAB — ECG 12-LEAD
Atrial Rate: 109 {beats}/min
P Axis: 28 degrees
P-R Interval: 218 ms
Q-T Interval: 312 ms
QRS Duration: 80 ms
QTC Calculation (Bezet): 420 ms
R Axis: 19 degrees
T Axis: 55 degrees
Ventricular Rate: 109 {beats}/min

## 2024-01-14 LAB — BASIC METABOLIC PANEL
Anion Gap: 12 (ref 5.0–15.0)
BUN: 36 mg/dL — ABNORMAL HIGH (ref 9–28)
CO2: 23 meq/L (ref 17–29)
Calcium: 8.6 mg/dL (ref 7.9–10.2)
Chloride: 105 meq/L (ref 99–111)
Creatinine: 1.5 mg/dL (ref 0.5–1.5)
GFR: 45.3 mL/min/{1.73_m2} — ABNORMAL LOW (ref >=60.0)
Glucose: 322 mg/dL — ABNORMAL HIGH (ref 70–100)
Potassium: 4.6 meq/L (ref 3.5–5.3)
Sodium: 140 meq/L (ref 135–145)

## 2024-01-14 LAB — MAGNESIUM: Magnesium: 1.9 mg/dL (ref 1.6–2.6)

## 2024-01-14 LAB — CULTURE, URINE

## 2024-01-14 LAB — LAB USE ONLY - LIGHT GREEN - LIHEP PST HOLD TUBE

## 2024-01-14 LAB — LAB USE ONLY - URINE GRAY CULTURE HOLD TUBE

## 2024-01-14 LAB — LAB USE ONLY - LIGHT BLUE - CITRATE HOLD TUBE

## 2024-01-14 MED ORDER — GLUCOSE 40 % PO GEL (WRAP)
15.0000 g | ORAL | Status: DC | PRN
Start: 2024-01-14 — End: 2024-01-17

## 2024-01-14 MED ORDER — NALOXONE HCL 0.4 MG/ML IJ SOLN (WRAP)
0.2000 mg | INTRAMUSCULAR | Status: DC | PRN
Start: 2024-01-14 — End: 2024-01-17

## 2024-01-14 MED ORDER — BENZONATATE 100 MG PO CAPS
100.0000 mg | ORAL_CAPSULE | Freq: Three times a day (TID) | ORAL | Status: DC | PRN
Start: 2024-01-14 — End: 2024-01-17

## 2024-01-14 MED ORDER — DEXTROSE 10 % IV BOLUS
12.5000 g | INTRAVENOUS | Status: DC | PRN
Start: 2024-01-14 — End: 2024-01-14

## 2024-01-14 MED ORDER — INSULIN GLARGINE 100 UNIT/ML SC SOLN
10.0000 [IU] | Freq: Every morning | SUBCUTANEOUS | Status: DC
Start: 2024-01-14 — End: 2024-01-17
  Administered 2024-01-15: 10 [IU] via SUBCUTANEOUS
  Filled 2024-01-14: qty 10

## 2024-01-14 MED ORDER — BENZOCAINE-MENTHOL MT LOZG (WRAP)
1.0000 | LOZENGE | OROMUCOSAL | Status: DC | PRN
Start: 2024-01-14 — End: 2024-01-17

## 2024-01-14 MED ORDER — HYDROMORPHONE HCL 1 MG/ML IJ SOLN
0.2000 mg | INTRAMUSCULAR | Status: DC | PRN
Start: 2024-01-14 — End: 2024-01-17

## 2024-01-14 MED ORDER — SALINE SPRAY 0.65 % NA SOLN
2.0000 | NASAL | Status: DC | PRN
Start: 2024-01-14 — End: 2024-01-17

## 2024-01-14 MED ORDER — GLUCAGON 1 MG IJ SOLR (WRAP)
1.0000 mg | INTRAMUSCULAR | Status: DC | PRN
Start: 2024-01-14 — End: 2024-01-14

## 2024-01-14 MED ORDER — DEXTROSE 50 % IV SOLN
12.5000 g | INTRAVENOUS | Status: DC | PRN
Start: 2024-01-14 — End: 2024-01-17

## 2024-01-14 MED ORDER — GLUCAGON 1 MG IJ SOLR (WRAP)
1.0000 mg | INTRAMUSCULAR | Status: DC | PRN
Start: 2024-01-14 — End: 2024-01-17

## 2024-01-14 MED ORDER — ACETAMINOPHEN 10 MG/ML IV SOLN
1000.0000 mg | Freq: Four times a day (QID) | INTRAVENOUS | Status: AC
Start: 2024-01-14 — End: 2024-01-15
  Administered 2024-01-14 – 2024-01-15 (×3): 1000 mg via INTRAVENOUS
  Filled 2024-01-14 (×2): qty 100

## 2024-01-14 MED ORDER — POTASSIUM CHLORIDE CRYS ER 20 MEQ PO TBCR
0.0000 meq | EXTENDED_RELEASE_TABLET | ORAL | Status: DC | PRN
Start: 2024-01-14 — End: 2024-01-17
  Administered 2024-01-16: 20 meq via ORAL
  Filled 2024-01-14: qty 1

## 2024-01-14 MED ORDER — DEXTROSE 10 % IV BOLUS
12.5000 g | INTRAVENOUS | Status: DC | PRN
Start: 2024-01-14 — End: 2024-01-17

## 2024-01-14 MED ORDER — POTASSIUM CHLORIDE 10 MEQ/100ML IV SOLN (WRAP)
10.0000 meq | INTRAVENOUS | Status: DC | PRN
Start: 2024-01-14 — End: 2024-01-17

## 2024-01-14 MED ORDER — GLUCOSE 40 % PO GEL (WRAP)
15.0000 g | ORAL | Status: DC | PRN
Start: 2024-01-14 — End: 2024-01-14

## 2024-01-14 MED ORDER — POTASSIUM & SODIUM PHOSPHATES 280-160-250 MG PO PACK
2.0000 | PACK | ORAL | Status: DC | PRN
Start: 2024-01-14 — End: 2024-01-17

## 2024-01-14 MED ORDER — ENOXAPARIN SODIUM 40 MG/0.4ML IJ SOSY
40.0000 mg | PREFILLED_SYRINGE | Freq: Every day | INTRAMUSCULAR | Status: DC
Start: 2024-01-15 — End: 2024-01-17
  Administered 2024-01-15 – 2024-01-17 (×3): 40 mg via SUBCUTANEOUS
  Filled 2024-01-14 (×2): qty 0.4

## 2024-01-14 MED ORDER — POTASSIUM CHLORIDE 20 MEQ PO PACK
0.0000 meq | PACK | ORAL | Status: DC | PRN
Start: 2024-01-14 — End: 2024-01-17

## 2024-01-14 MED ORDER — MAGNESIUM SULFATE IN D5W 1-5 GM/100ML-% IV SOLN
1.0000 g | INTRAVENOUS | Status: DC | PRN
Start: 2024-01-14 — End: 2024-01-17

## 2024-01-14 MED ORDER — DEXTROSE 50 % IV SOLN
12.5000 g | INTRAVENOUS | Status: DC | PRN
Start: 2024-01-14 — End: 2024-01-14

## 2024-01-14 MED ORDER — CARBOXYMETHYLCELLULOSE SODIUM 0.5 % OP SOLN
1.0000 [drp] | Freq: Three times a day (TID) | OPHTHALMIC | Status: DC | PRN
Start: 2024-01-14 — End: 2024-01-17

## 2024-01-14 MED ORDER — HYDROMORPHONE HCL 1 MG/ML IJ SOLN
0.4000 mg | INTRAMUSCULAR | Status: DC | PRN
Start: 2024-01-14 — End: 2024-01-17

## 2024-01-14 MED ORDER — INSULIN GLARGINE 100 UNIT/ML SC SOLN
20.0000 [IU] | Freq: Every morning | SUBCUTANEOUS | Status: DC
Start: 2024-01-14 — End: 2024-01-17
  Administered 2024-01-14 – 2024-01-17 (×3): 20 [IU] via SUBCUTANEOUS
  Filled 2024-01-14 (×3): qty 20

## 2024-01-14 MED ORDER — INSULIN LISPRO 100 UNIT/ML SOLN (WRAP)
1.0000 [IU] | Status: DC
Start: 2024-01-14 — End: 2024-01-17
  Administered 2024-01-14 (×2): 4 [IU] via SUBCUTANEOUS
  Administered 2024-01-14: 5 [IU] via SUBCUTANEOUS
  Administered 2024-01-15: 1 [IU] via SUBCUTANEOUS
  Administered 2024-01-15: 4 [IU] via SUBCUTANEOUS
  Administered 2024-01-15 (×2): 2 [IU] via SUBCUTANEOUS
  Administered 2024-01-16 (×3): 3 [IU] via SUBCUTANEOUS
  Administered 2024-01-16: 4 [IU] via SUBCUTANEOUS
  Administered 2024-01-17: 3 [IU] via SUBCUTANEOUS
  Administered 2024-01-17: 2 [IU] via SUBCUTANEOUS
  Administered 2024-01-17 (×2): 1 [IU] via SUBCUTANEOUS
  Administered 2024-01-17: 3 [IU] via SUBCUTANEOUS
  Filled 2024-01-14 (×2): qty 6
  Filled 2024-01-14 (×2): qty 12
  Filled 2024-01-14: qty 6
  Filled 2024-01-14 (×2): qty 9
  Filled 2024-01-14: qty 15
  Filled 2024-01-14: qty 9
  Filled 2024-01-14: qty 12
  Filled 2024-01-14: qty 3
  Filled 2024-01-14: qty 12
  Filled 2024-01-14: qty 9
  Filled 2024-01-14: qty 12

## 2024-01-14 MED ORDER — MELATONIN 3 MG PO TABS
3.0000 mg | ORAL_TABLET | Freq: Every evening | ORAL | Status: DC | PRN
Start: 2024-01-14 — End: 2024-01-17

## 2024-01-14 NOTE — OT Eval Note (Signed)
 Occupational Therapy Eval and Treatment Roy W Duthie Jr.        Post Acute Care Therapy Recommendations   Discharge Recommendations:  SNF      DME needs IF patient is discharging home: Wheelchair-manual, [facility]    Therapy discharge recommendations may change with patient status.  Please refer to most recent note for up-to-date recommendations.    Unit: [address] [facility]  Bed: A2614/A2614-01      ___________________________________________________    Time of Evaluation and Treatment:  Time Calculation  OT Received On: 01/14/24  Start Time: 1505  Stop Time: 1528  Time Calculation (min): 23 min         Evaluation: 10 minutes  Treatment:  13 minutes         Consult received for [name] for OT Evaluation and Treatment.  Patient's medical condition is appropriate for Occupational therapy intervention at this time.    Activity Orders:  OT eval and treat and progressive mobility protocol    Precautions and Contraindications:  Precautions  Weight Bearing Status: no restrictions  Fall Risks: High, Impaired balance/gait, Impaired mobility, Muscle weakness  Other Precautions: NGT    Personal Protective Equipment (PPE)  gloves and procedure mask    Medical Diagnosis:  Intestinal volvulus [K56.2]  Acute UTI [N39.0]    History of Present Illness:  Roy Tavano. is a 88 y.o. male admitted on 01/13/2024 with DM1, peripheral neuropathy, glaucoma (diabetic proliferative eye disease), HTN, prostate cancer hx who presented yesterday with severe, acute nonbloody vomiting with associated diffuse cramping nonradiating abdominal pain since 3 AM today. Patient also notes frequent urination and strong, malodorous smelling urine for the past few days.  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     Problem List[1]     Past Medical/Surgical History:  Medical History[2]  Past Surgical History[3]     X-Rays/Tests/Labs  Lab Results   Component Value Date/Time    HGB 10.8 (L) 01/14/2024 04:04 AM     HGB 13.1 04/12/2014 12:37 PM    HCT 35.1 (L) 01/14/2024 04:04 AM    HCT 38.8 (L) 04/12/2014 12:37 PM    K 4.6 01/14/2024 10:38 AM    K 4.3 04/12/2014 12:37 PM    NA 140 01/14/2024 10:38 AM    NA 144 04/12/2014 12:37 PM    TROPI 0.08 07/26/2010 01:55 AM    TROPI 0.04 07/25/2010 04:56 PM       All imaging reviewed. Please see chart for details      Social History:  Prior Level of Function  Prior level of function: Ambulates with assistive device, Needs assistance with ADLs  Assistive Device: Other (Comment) (rollator)  Baseline Activity Level: Community ambulation  Dressing - Upper Body: other(comment) (needs assist)  Dressing - Lower Body: other(comment) (needs assist)  Feeding: independent  Bathing: other(comment) (needs assist; sponge bath only)  Grooming: independent  Toileting: independent  DME Currently at Home: ADL- Grab Bars, Other (Comment) (rollator, toilet riser)    Home Living Arrangements  Living Arrangements: Children (Dtr)  Type of Home: House  Home Layout: Multi-level, Able to live on main level with bedroom/bathroom  Bathroom Shower/Tub:  (Pt sponge baths only)  Bathroom Toilet: Raised  Bathroom Equipment: Grab bars around toilet, Toilet riser  DME Currently at Home: ADL- Grab Bars, Other (Comment) (rollator, toilet riser)  Home Living - Notes / Comments: Pt's aide present during session. Mon  and Fri pt has aide  for 5 1/2 hours. Tues, Wed, Thurs, aide is there for 7 hours. Pt requires assist to stand up and aide/family is usually present for ambulation.      Subjective:      Patient is agreeable to participation in the therapy session. Nursing clears patient for therapy.      Pain Assessment  Pain Assessment: Numeric Scale (0-10)  Pain Score: 8-severe pain  Pain Location: Shoulder  Pain Orientation: Right  Pain Intervention(s): Repositioned      Objective:      Observation of Patient/Vital Signs: Stable    Cognitive Status and Neuro Exam:  Cognition/Neuro Status  Arousal/Alertness: Appropriate  responses to stimuli  Attention Span: Appears intact  Orientation Level: Oriented to person, Oriented to place, Oriented to time  Memory: Appears intact  Following Commands: minimal verbal instruction  Safety Awareness: minimal verbal instruction  Insights: Decreased awareness of deficits, Educated in safety awareness  Behavior: calm, cooperative  Motor Planning: intact  Coordination: intact  Hand Dominance: right handed (carpal tunnel on R hand and increased weakness; Pt reports using L hand for self feeding)    Musculoskeletal Examination  Gross ROM  Right Upper Extremity ROM: within functional limits  Left Upper Extremity ROM: within functional limits  Gross Strength  Right Upper Extremity Strength: 4/5  R Hand: 3+/5  Left Upper Extremity Strength: 4/5       Activities of Daily Living  Self-care and Home Management  UB Dressing: Maximal Assist, in bed    Functional Mobility:  Mobility and Transfers  Scooting to HOB: Minimal Assist  Supine to Sit: Minimal Assist  Sit to Supine: Minimal Assist     Balance  Balance  Static Sitting Balance: Contact Guard Assist    Participation and Activity Tolerance  Participation and Endurance  Participation Effort: fair  Endurance: Tolerates < 10 min exercise, no significant change in vital signs         Educated the Patient and Caregiver to role of occupational therapy, plan of care, goals of therapy and safety with mobility and ADLs, discharge instructions with verbalized understanding .    Patient left in bed. Pt left with all appropriate medical equipment in place, call bell and pt personal items/needs within reach, and without alarm in place ( please note pt received without alarm in place or left without alarm per unit protocol). RN notified of session outcome. Notified CM team and attending of d/c recommendation via secure chat.      Assessment:  Roy Aloisi. is a 88 y.o. male admitted 01/13/2024. OT Assessment: decreased independence with ADLs;balance  deficits;decreased endurance/activity tolerance;decreased safety awareness          Treatment:   Facilitated supine to sit with Pt requiring min a. Monitored vitals at EOB. Pt reporting increased RUE pain and inability to tolerate sitting EOB. Encouraged pt to stay sitting EOB, however Pt insisting on returning to supine due to pain. RN notified. Therapist explained the importance of continued mobility and ADL participation to prevent functional decline.    Rehabilitation Potential: Prognosis: Good, With continued OT s/p acute discharge    Plan:  OT Frequency Recommended: 2-3x/wk   Treatment Interventions: ADL retraining, Functional transfer training, Patient/Family training, Equipment eval/education, Continued evaluation     PMP Activity: Step 4 - Dangle at Bedside  Distance Walked (ft) (Step 6,7): 0 Feet    Risks/benefits/POC discussed    Goals:  Time For Goal Achievement: by time of discharge  ADL Goals  Patient will  groom self: Supervision, at edge of bed, Discontinued (comment)  Mobility and Transfer Goals  Pt will transfer bed to [facility]: Minimal Assist, with rolling walker, Not met  Neuro Re-Ed Goals  Pt will sit at edge of bed: Supervision, to prepare for OOB tasks, Not met                        Duwaine Donald, OTR/L    Physical Medicine and Rehabilitation  [facility]  [phone]    01/14/2024  4:06 PM    [facility]  Patient: Avrom Robarts. MRN#: [id number]   Unit: [address] [facility] Bed: A2614/A2614-01        [1]   Patient Active Problem List  Diagnosis    Prostate cancer    Volvulus    Chronic systolic CHF (congestive heart failure), NYHA class 2    Benign essential hypertension    Chronic pain disorder    Type II or unspecified type diabetes mellitus with renal manifestations, not stated as uncontrolled(250.40)    Cystitis   [2]   Past Medical History:  Diagnosis Date    Diabetes     Glaucoma     Hypertension    [3]   Past Surgical History:  Procedure Laterality Date     appendectomy 1991      LAPAROSCOPY, DIAGNOSTIC N/A 01/13/2024    Procedure: LAPAROSCOPY, DIAGNOSTIC, LYSIS OF ADHESIONS;  Surgeon: Soyla Dallas BROCKS, DO;  Location: [facility];  Service: General;  Laterality: N/A;    LAPAROSCOPY, DIAGNOSTIC BARIATRIC  01/13/2024    Procedure: LAPAROSCOPY, REDUCTION OF VOLVULUS, REPAIR INTERNAL HERNIA;  Surgeon: Soyla Dallas BROCKS, DO;  Location: [facility];  Service: General;;

## 2024-01-14 NOTE — H&P (Signed)
 [facility]      Patient: Roy Delgado.  Date: 01/13/2024   DOB: 06-27-1936  Date of Admission: 01/13/2024   MRN: [id number]  Attending: Edsel JAYSON Saba, MD         Chief Complaint   Patient presents with   . Emesis   . Abdominal Pain        History Gathered From: patient, chart review    HISTORY AND PHYSICAL     Roy W Fabien Travelstead. is a 88 y.o. male with DM1, peripheral neuropathy, glaucoma (diabetic proliferative eye disease), HTN, prostate cancer hx who presented yesterday with severe, acute nonbloody vomiting with associated diffuse cramping nonradiating abdominal pain since 3 AM today. Patient also notes frequent urination and strong, malodorous smelling urine for the past few days. No diarrhea or constipation. No chest pain or shortness of breath. No fever or chills. 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  Patient seen after surgery and reports he feels well, and he is excited for when he can drink water  again.      Medical History[1]    Past Surgical History[2]    Prior to Admission medications    Medication Sig Start Date End Date Taking? Authorizing Provider   Brimonidine Tartrate-Timolol  (COMBIGAN OP) Apply 1 drop to eye 2 (two) times daily.   Yes [provider]   insulin  glargine (LANTUS ) 100 UNIT/ML injection Inject 27 Units into the skin daily   Yes [provider]   Insulin  Lispro (HUMALOG  SC) Inject 15 Units into the skin 3 (three) times daily.   Yes [provider]   levocetirizine (XYZAL) 2.5 MG/5ML solution Take 5 mLs (2.5 mg) by mouth every evening   Yes [provider]   losartan  (COZAAR ) 25 MG tablet Take 1 tablet (25 mg) by mouth daily   Yes [provider]   metoprolol  succinate XL (TOPROL -XL) 25 MG 24 hr tablet Take 1 tablet (25 mg) by mouth daily   Yes [provider]   montelukast  (SINGULAIR ) 10 MG tablet Take 1 tablet (10 mg) by mouth nightly   Yes [provider]   pregabalin   (LYRICA ) 100 MG capsule Take 1 capsule (100 mg) by mouth daily   Yes [provider]   colchicine  0.6 MG tablet Take 1 tab by mouth twice daily for 5 days then reduce to 1 tab daily. F/u with PCP 03/14/16   Freida Schwalbe, MD   hydrochlorothiazide  (HYDRODIURIL ) 25 MG tablet Take 25 mg by mouth daily.    [provider]   olmesartan (BENICAR) 40 MG tablet Take 40 mg by mouth daily.    [provider]       Allergies[3]    Family History[4]    Social History[5]    REVIEW OF SYSTEMS   See HPI  All ROS completed and otherwise negative.    PHYSICAL EXAM     Vital Signs (most recent): BP 160/75   Pulse 87   Temp 98.1 F (36.7 C) (Oral)   Resp 17   Ht 1.867 m (6' 1.5)   Wt 84.8 kg (187 lb)   SpO2 97%   BMI 24.34 kg/m   Constitutional: No apparent distress. Patient speaks freely in full sentences.   HEENT: NC/AT, PERRL, no scleral icterus or conjunctival pallor, MMM  Neck: trachea midline  Cardiovascular: RRR, normal S1 S2, no murmurs, rubs or gallops, no JVD  Respiratory: Normal rate. CTAB  Gastrointestinal: bowel sounds present,  Surgical dressing abdomen clean dry and intact.    Musculoskeletal: moving all extremities, No clubbing, edema, or cyanosis. DP and radial pulses 2+ and symmetric.  Neurologic: EOMI, no gross motor or sensory deficits  Psychiatric: AAOx3, affect and mood appropriate. The patient is alert, interactive, appropriate.      LABS & IMAGING     All Labs and imaging were independently reviewed by me  7.18 \ 12.6 / 327    / 38.0 \    CBC: 01/20 1447    138 102 26 / 240*   4.5 25 1.2 \    BMP: 01/20 1447            ED course was reviewed personally by me.    All Medications Have been reviewed and updated in the [facility] by me.    ASSESSMENT & PLAN     Roy Ruz Larnie Heart. is a 88 y.o. male admitted with Intestinal volvulus.    Active Hospital Problems    Diagnosis   . Volvulus   . Chronic systolic CHF (congestive heart failure), NYHA class 2   . Type II or unspecified type  diabetes mellitus with renal manifestations, not stated as uncontrolled(250.40)   . Benign essential hypertension     Volvulus  -s/p surgery  - NPO for now, will place PRN pain control for patient, continue fluids    DM1/2  - on insulin , unclear if DM1 or 2 per notes, appears to be more of a LADA type so will continue insulin  even though NPO  - aim for euglycemia    Cystitis-  - continue ceftriaxone  for 5 days  - culture pending    HTN- holding meds as NPO, will restart when able to tolerate NPO  - will only treat HTN if symptomatic      Nutrition: NPO for now pending surgical reevaluation    DVT/VTE Prophylaxis:   Current Facility-Administered Medications (Includes Only Anticoagulants, Misc. Hematological)   Medication Dose Route Last Admin   . [START ON 01/15/2024] enoxaparin  (LOVENOX ) syringe 40 mg  40 mg Subcutaneous         Code Status: Full Code    Patient Class: Inpatient    Anticipated medical stability for discharge: Greater than 48 Hours      Signed,  Edsel JAYSON Saba, MD    01/14/2024 12:29 AM                 [1]  Past Medical History:  Diagnosis Date   . Diabetes    . Glaucoma    . Hypertension    [2]  Past Surgical History:  Procedure Laterality Date   . appendectomy 1991     [3]  No Known Allergies  [4]  Family History  Problem Relation Name Age of Onset   . Colon cancer Father     [5]  Social History  Tobacco Use   . Smoking status: Never   . Smokeless tobacco: Never   Substance Use Topics   . Alcohol use: Yes     Alcohol/week: 0.0 standard drinks of alcohol     Comment: 2 drinks a month   . Drug use: No

## 2024-01-14 NOTE — Progress Notes (Signed)
 PROGRESS NOTE  [facility]    Date Time: 01/14/24 6:19 AM  Patient Name: Roy Delgado, Roy Delgado.      ASSESSMENT:   1 Day Post-Op S/P Procedure(s):  LAPAROSCOPY, DIAGNOSTIC, LYSIS OF ADHESIONS  LAPAROSCOPY, REDUCTION OF VOLVULUS, REPAIR INTERNAL HERNIA    Roy Delgado. is a 88 y.o. male with internal hernia causing bowel obstruction (2/2 adhesive tissue), now s/p diagnostic laparoscopy with LOA and reduction of jejunal volvulus. To remain NPO today    PLAN:   -- NPO, NGT to LCWS  -- mIVF  -- multimodal pain control  -- encourage ambulation, can clamp NGT to ambulate        SUBJECTIVE:   No acute overnight events. Doing well this morning.        OBJECTIVE:   Current Vitals:   Vitals:    01/14/24 0356   BP: 165/81   Pulse: 96   Resp: 17   Temp: 97.7 F (36.5 C)   SpO2: 95%       Intake and Output Summary (Last 24 hours):  I/O last 3 completed shifts:  In: 1000 [IV Piggyback:1000]  Out: -     Labs:     Results       Procedure Component Value Units Date/Time    Magnesium  [[id number]]  (Normal) Collected: 01/14/24 0404    Specimen: Blood, Venous Updated: 01/14/24 0524     Magnesium  1.9 mg/dL     Renal Function Panel [[id number]]  (Abnormal) Collected: 01/14/24 0404    Specimen: Blood, Venous Updated: 01/14/24 0524     Glucose 433 mg/dL      Sodium 859 mEq/L      Potassium 5.1 mEq/L      Chloride 103 mEq/L      CO2 19 mEq/L      BUN 34 mg/dL      Calcium 8.8 mg/dL      Creatinine 1.3 mg/dL      Albumin  3.2 g/dL      Phosphorus 4.6 mg/dL      Anion Gap 81.9     GFR 53.8 mL/min/1.73 m2     CBC with Differential (Order) [[id number]]  (Abnormal) Collected: 01/14/24 0404    Specimen: Blood, Venous Updated: 01/14/24 0453    Narrative:      The following orders were created for panel order CBC with Differential (Order).  Procedure                               Abnormality         Status                     ---------                               -----------         ------                     CBC with  Differential (...[[id number]]  Abnormal            Final result                 Please view results for these tests on the individual orders.    CBC with Differential (Component) [[id number]]  (Abnormal) Collected: 01/14/24 0404    Specimen: Blood, Venous Updated: 01/14/24 9546  WBC 12.45 x10 3/uL      Hemoglobin 10.8 g/dL      Hematocrit 64.8 %      Platelet Count 272 x10 3/uL      MPV 11.3 fL      RBC 3.81 x10 6/uL      MCV 92.1 fL      MCH 28.3 pg      MCHC 30.8 g/dL      RDW 15 %      nRBC % 0.0 /100 WBC      Absolute nRBC 0.00 x10 3/uL      Preliminary Absolute Neutrophil Count 10.85 x10 3/uL      Neutrophils % 87.1 %      Lymphocytes % 4.7 %      Monocytes % 7.4 %      Eosinophils % 0.1 %      Basophils % 0.2 %      Immature Granulocytes % 0.5 %      Absolute Neutrophils 10.85 x10 3/uL      Absolute Lymphocytes 0.58 x10 3/uL      Absolute Monocytes 0.92 x10 3/uL      Absolute Eosinophils 0.01 x10 3/uL      Absolute Basophils 0.03 x10 3/uL      Absolute Immature Granulocytes 0.06 x10 3/uL     Whole Blood Glucose POCT [[id number]]  (Abnormal) Collected: 01/14/24 0347    Specimen: Blood, Capillary Updated: 01/14/24 0407     Whole Blood Glucose POCT 416 mg/dL     Urine [facility] Culture Hold Delgado [[id number]] Collected: 01/13/24 1633    Specimen: Urine, Clean Catch Updated: 01/14/24 0100     Extra Delgado Hold for add-ons.    Rainbow Draw [[id number]] Collected: 01/13/24 1448    Specimen: Blood, Venous Updated: 01/14/24 0000    Narrative:      The following orders were created for panel order Rainbow Draw.  Procedure                               Abnormality         Status                     ---------                               -----------         ------                     Roy Delgado[[id number]]                      Final result               Light Blue - Citrate Ho.SABRA.[[id number]]                      Final result                 Please view results for these tests on the individual orders.     Roy Delgado [[id number]] Collected: 01/13/24 1448    Specimen: Blood, Venous Updated: 01/14/24 0000     Extra Delgado Hold for add-ons.    Light Blue - Citrate Hold Delgado [[id number]] Collected: 01/13/24 1448    Specimen: Blood, Venous  Updated: 01/14/24 0000     Extra Delgado Hold for add-ons.    Whole Blood Glucose POCT [[id number]]  (Abnormal) Collected: 01/13/24 1909    Specimen: Blood, Capillary Updated: 01/13/24 1918     Whole Blood Glucose POCT 299 mg/dL     Culture, Blood, Aerobic And Anaerobic [[id number]] Collected: 01/13/24 1743    Specimen: Blood, Venous Updated: 01/13/24 1750    Urinalysis with Reflex to Microscopic Exam and Culture [[id number]]  (Abnormal) Collected: 01/13/24 1633    Specimen: Urine, Clean Catch Updated: 01/13/24 1718     Urine Color Yellow     Urine Clarity Turbid     Urine Specific Gravity 1.033     Urine pH 6.0     Urine Leukocyte Esterase Large     Urine Nitrite Negative     Urine Protein 100= 2+     Urine Glucose 300= 3+     Urine Ketones 10= 1+ mg/dL      Urine Urobilinogen Normal mg/dL      Urine Bilirubin Negative     Urine Blood Small     RBC, UA 26-50 /hpf      Urine WBC Too numerous to count /hpf      Urine Squamous Epithelial Cells 0-5 /hpf      Urine Mucus Present     Urine WBC Clumps Many /hpf     Culture, Urine [[id number]] Collected: 01/13/24 1633    Specimen: Urine, Clean Catch Updated: 01/13/24 1718    Lactic Acid [[id number]]  (Normal) Collected: 01/13/24 1624    Specimen: Blood, Venous Updated: 01/13/24 1658     Lactic Acid 1.8 mmol/L     COVID-19 (SARS-CoV-2) and Influenza A/B, NAA (Liat)- Admission [[id number]]  (Normal) Collected: 01/13/24 1449    Specimen: Swab from Anterior Nares Updated: 01/13/24 1525     SARS-CoV-2 (COVID-19) RNA Not Detected     Influenza A RNA Not Detected     Influenza B RNA Not Detected    Narrative:      A result of Detected indicates POSITIVE for the presence of viral RNA  A result of Not Detected indicates NEGATIVE  for the presence of viral RNA    Test performed using the Roche cobas Liat SARS-CoV-2 & Influenza A/B assay. This is a multiplex real-time RT-PCR assay for the detection of SARS-CoV-2, influenza A, and influenza B virus RNA. Viral nucleic acids may persist in vivo, independent of viability. Detection of viral nucleic acid does not imply the presence of infectious virus, or that virus nucleic acid is the cause of clinical symptoms. Negative results do not preclude SARS-CoV-2, influenza A, and/or influenza B infection and should not be used as the sole basis for diagnosis, treatment or other patient management decisions. Invalid results may be due to inhibiting substances in the specimen and recollection should occur.     Comprehensive Metabolic Panel [[id number]]  (Abnormal) Collected: 01/13/24 1447    Specimen: Blood, Venous Updated: 01/13/24 1524     Glucose 240 mg/dL      BUN 26 mg/dL      Creatinine 1.2 mg/dL      Sodium 861 mEq/L      Potassium 4.5 mEq/L      Chloride 102 mEq/L      CO2 25 mEq/L      Calcium 9.3 mg/dL      Anion Gap 88.9     GFR 59.3 mL/min/1.73 m2      AST (SGOT) 14 U/L  ALT <6 U/L      Alkaline Phosphatase 159 U/L      Albumin  3.5 g/dL      Protein, Total 6.9 g/dL      Globulin 3.4 g/dL      Albumin /Globulin Ratio 1.0     Bilirubin, Total 0.6 mg/dL     Lipase [[id number]]  (Normal) Collected: 01/13/24 1447    Specimen: Blood, Venous Updated: 01/13/24 1524     Lipase 50 U/L     CBC with Differential (Order) [[id number]] Collected: 01/13/24 1447    Specimen: Blood, Venous Updated: 01/13/24 1506    Narrative:      The following orders were created for panel order CBC with Differential (Order).  Procedure                               Abnormality         Status                     ---------                               -----------         ------                     CBC with Differential (...[[id number]]                      Final result                 Please view results for these tests on  the individual orders.    CBC with Differential (Component) [[id number]] Collected: 01/13/24 1447    Specimen: Blood, Venous Updated: 01/13/24 1506     WBC 7.18 x10 3/uL      Hemoglobin 12.6 g/dL      Hematocrit 61.9 %      Platelet Count 327 x10 3/uL      MPV 10.8 fL      RBC 4.36 x10 6/uL      MCV 87.2 fL      MCH 28.9 pg      MCHC 33.2 g/dL      RDW 15 %      nRBC % 0.0 /100 WBC      Absolute nRBC 0.00 x10 3/uL      Preliminary Absolute Neutrophil Count 5.75 x10 3/uL      Neutrophils % 80.2 %      Lymphocytes % 9.7 %      Monocytes % 7.2 %      Eosinophils % 2.1 %      Basophils % 0.7 %      Immature Granulocytes % 0.1 %      Absolute Neutrophils 5.75 x10 3/uL      Absolute Lymphocytes 0.70 x10 3/uL      Absolute Monocytes 0.52 x10 3/uL      Absolute Eosinophils 0.15 x10 3/uL      Absolute Basophils 0.05 x10 3/uL      Absolute Immature Granulocytes 0.01 x10 3/uL             Rads:     Radiology Results (24 Hour)       Procedure Component Value Units Date/Time    XR Chest AP Portable [[id number]] Collected: 01/13/24 1738    Order Status: Completed Updated:  01/13/24 1742    Narrative:      HISTORY: Status post nasogastric Delgado placement.     COMPARISON: 07/25/2010    FINDINGS:   Nasogastric Delgado tip projects over the proximal stomach. Mild reticulation  of the lung bases, better demonstrated on recent CT. Cardiac and  mediastinal contours within normal limits in size.      Impression:        Nasogastric Delgado terminates in the proximal stomach    Alphonza JONETTA Cohn, MD  01/13/2024 5:39 PM    CT Abd/Pelvis with IV Contrast [[id number]] Collected: 01/13/24 1605    Order Status: Completed Updated: 01/13/24 1617    Narrative:      HISTORY: Diffuse abdominal pain and vomiting for one day.    COMPARISON: None available.    TECHNIQUE: CT of the abdomen and pelvis performed with intravenous  contrast. The following dose reduction techniques were utilized: automated  exposure control and/or adjustment of the mA and/or KV according to  patient  size, and the use of an iterative reconstruction technique.    CONTRAST: iohexol  (OMNIPAQUE ) 350 MG/ML injection 100 mL    FINDINGS:  Dilated fluid-filled stomach with dilated duodenum and proximal jejunum.  Caliber transition point is seen in the proximal jejunum near the root of  the mesentery which has a slightly swirled appearance. Remainder of small  bowel is nondilated. Colon is appropriately positioned with large stool  volume. No free fluid or fluid collection.    Small hepatic cysts. Spleen and adrenal glands appear unremarkable.  Pancreas appears atrophic. Gallbladder is present with no surrounding  inflammation or biliary dilatation. Portal vein is patent. Kidneys enhance  symmetrically with no hydronephrosis. Nonobstructive left renal calculi and  small renal cysts. Atherosclerotic calcification of the abdominal aorta  without aneurysm. No enlarged lymph nodes identified. Bladder appears  unremarkable. Osteopenia and degenerative changes in the spine. Pulmonary  fibrosis and bronchiectasis in the lung bases.      Impression:          1. Dilated fluid-filled stomach and duodenum with obstruction in the  proximal jejunum where there is slight swirling at the root of the  mesentery suspicious for midgut volvulus.  2. Nonobstructive left renal calculi and small renal cysts.    COMMENT: This urgent result was discussed with and acknowledged by Dr.  Von, at 1612 hours on 01/13/2024.    Levada Cinnamon, MD  01/13/2024 4:15 PM            Physical Exam:     PHYSICAL EXAM:  General -- NAD, alert and cooperative  HEENT -- trachea midline, sclerae anicteric, NGT in place  Pulm -- non labored breathing on room air  Abd -- soft, non-distended, appropriately tender, no rigidity/guarding   Extr -- warm, no edema  Neuro -- grossly intact, no focal deficits        Signed by:   Ortencia JINNY Portela, PA

## 2024-01-14 NOTE — Progress Notes (Signed)
 [facility]  PROGRESS NOTE      Patient: Roy Delgado.  Date: 01/14/2024   LOS: 1 Days  Admission Date: 01/13/2024   MRN: [id number]  Attending: Toribio JINNY Rattler, MD  When on service as the attending, please contact me on Epic Secure Chat from 7 AM - 7 PM for non-urgent issues. For urgent matters use XTend page from 7 AM - 7 PM.       ASSESSMENT/PLAN     Roy LELON Labrandon Knoch. is a 88 y.o. male admitted with Volvulus    Interval Summary:     Patient Active Hospital Problem List:    #Upper small bowel Volvulus  - Presenting with acute onset abdominal pain, n/v.  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  Monitor for hypoglycemia but given known history will continue lantus  despite NPO status     Cystitis-  - continue ceftriaxone  for 5 days  - culture pending     HTN  - holding meds as NPO, will restart when able to tolerate NPO  - will only treat HTN if symptomatic           Nutrition: NPO                         Recent Labs   Lab 01/14/24  0404 01/13/24  1447   Hemoglobin 10.8* 12.6   Hematocrit 35.1* 38.0   MCV 92.1 87.2   WBC 12.45* 7.18   Platelet Count 272 327         Anemia Diagnosis: Unspecified Anemia (currently unable to determine type)    Cr Baseline Estimation (minimum in last 3 months): 1.2 mg/dL  Maximum Cr in last 36 hours: 1.5 mg/dL    Recent Labs (Last 3 Months)     01/14/24  1038 01/14/24  0404 01/13/24  1447   Creatinine 1.5 1.3 1.2   BUN 36* 34* 26       Acute Kidney Injury Diagnosis: Acute Kidney Injury, present on admission         Code Status: Full Code    Dispo: pending further workup    Family Contact: Nat Signa, daughter.      DVT Prophylaxis:    Current Facility-Administered Medications (Includes Only Anticoagulants, Misc. Hematological)   Medication Dose Route Last Admin   . [START ON 01/15/2024] enoxaparin  (LOVENOX ) syringe 40 mg  40 mg Subcutaneous            CHART  REVIEW & DISCUSSION     The following chart items were reviewed as of 1:20 PM on 01/14/24:  [x]  Lab Results [x]  Imaging Results   [x]  Problem List  [x]  Current Orders [x]  Current Medications  [x]  Allergies  [x]  Code Status [x]  Previous Notes   []  SDoH    The management and plan of care for this patient was discussed with the following specialty consultants:  []  Cardiology  []  Gastroenterology                 []  Infectious Disease  []  Pulmonology []  Neurology                []   Nephrology  []  Neurosurgery []  Orthopedic Surgery  []  Heme/Onc  [x]  General Surgery []  Psychiatry                                   []  Palliative    SUBJECTIVE     Roy LELON Signa Mickey. Seen and examined at bedside, reports his pain has resolved and he is feeling improved.    MEDICATIONS     Current Facility-Administered Medications   Medication Dose Route Frequency   . acetaminophen   1,000 mg Intravenous Q6H   . cefTRIAXone   2 g Intravenous Q24H   . [START ON 01/15/2024] enoxaparin   40 mg Subcutaneous Daily   . insulin  glargine  20 Units Subcutaneous QAM    Or   . insulin  glargine  10 Units Subcutaneous QAM   . insulin  lispro  1-5 Units Subcutaneous Q4H SCH       PHYSICAL EXAM     Vitals:    01/14/24 1210   BP: 135/71   Pulse: 81   Resp: 17   Temp: 97.9 F (36.6 C)   SpO2: 96%       Temperature: Temp  Min: 97.4 F (36.3 C)  Max: 98.4 F (36.9 C)  Pulse: Pulse  Min: 73  Max: 112  Respiratory: Resp  Min: 12  Max: 22  Non-Invasive BP: BP  Min: 115/72  Max: 196/116  Pulse Oximetry SpO2  Min: 93 %  Max: 97 %    Intake and Output Summary (Last 24 hours) at Date Time    Intake/Output Summary (Last 24 hours) at 01/14/2024 1320  Last data filed at 01/14/2024 0800  Gross per 24 hour   Intake 2160 ml   Output 905 ml   Net 1255 ml      GEN APPEARANCE: Normal;  A&OX3  HEENT: PERLA; EOMI; Conjunctiva Clear.  NGT in place  NECK: Supple; No bruits.  CVS: RRR, S1, S2; No M/G/R  LUNGS: CTAB; No Wheezes; No Rhonchi: No rales  ABD: Soft; No TTP; + Normoactive BS  EXT: No edema; Pulses 2+ and intact  NEURO: CN 2-12 intact; No Focal neurological deficits    LABS     Recent Labs   Lab 01/14/24  0404 01/13/24  1447   WBC 12.45* 7.18   RBC 3.81* 4.36   Hemoglobin 10.8* 12.6   Hematocrit 35.1* 38.0   MCV 92.1 87.2   Platelet Count 272 327       Recent Labs   Lab 01/14/24  1038 01/14/24  0404 01/13/24  1447   Sodium 140 140 138   Potassium 4.6 5.1 4.5   Chloride 105 103 102   CO2 23 19 25    BUN 36* 34* 26   Creatinine 1.5 1.3 1.2   Glucose 322* 433* 240*   Calcium 8.6 8.8 9.3   Magnesium   --  1.9  --        Recent Labs   Lab 01/14/24  0404 01/13/24  1447   ALT  --  <6   AST (SGOT)  --  14   Bilirubin, Total  --  0.6   Albumin  3.2* 3.5   Alkaline Phosphatase  --  159*                   Microbiology Results (last 15 days)       Procedure Component Value Units Date/Time    Culture, Blood, Aerobic  And Anaerobic [[id number]] Collected: 01/13/24 1743    Order Status: Resulted Specimen: Blood, Venous Updated: 01/13/24 1750    Culture, Urine [[id number]] Collected: 01/13/24 1633    Order Status: Sent Specimen: Urine, Clean Catch Updated: 01/13/24 1718    COVID-19 (SARS-CoV-2) and Influenza A/B, NAA (Liat)- Admission [[id number]]  (Normal) Collected: 01/13/24 1449    Order Status: Completed Specimen: Swab from Anterior Nares Updated: 01/13/24 1525     SARS-CoV-2 (COVID-19) RNA Not Detected     Influenza A RNA Not Detected     Influenza B RNA Not Detected    Narrative:      A result of Detected indicates POSITIVE for the presence of viral RNA  A result of Not Detected indicates NEGATIVE for the presence of viral RNA    Test performed using the Roche cobas Liat SARS-CoV-2 & Influenza A/B assay. This is a multiplex real-time RT-PCR assay for the detection of  SARS-CoV-2, influenza A, and influenza B virus RNA. Viral nucleic acids may persist in vivo, independent of viability. Detection of viral nucleic acid does not imply the presence of infectious virus, or that virus nucleic acid is the cause of clinical symptoms. Negative results do not preclude SARS-CoV-2, influenza A, and/or influenza B infection and should not be used as the sole basis for diagnosis, treatment or other patient management decisions. Invalid results may be due to inhibiting substances in the specimen and recollection should occur.              RADIOLOGY     XR Chest AP Portable    Result Date: 01/13/2024  Nasogastric tube terminates in the proximal stomach Alphonza JONETTA Cohn, MD 01/13/2024 5:39 PM    CT Abd/Pelvis with IV Contrast    Result Date: 01/13/2024  1. Dilated fluid-filled stomach and duodenum with obstruction in the proximal jejunum where there is slight swirling at the root of the mesentery suspicious for midgut volvulus. 2. Nonobstructive left renal calculi and small renal cysts. COMMENT: This urgent result was discussed with and acknowledged by Dr. Von, at 1612 hours on 01/13/2024. Levada Cinnamon, MD 01/13/2024 4:15 PM   Echo Results       None          No results found for this or any previous visit.    Signed,  Toribio JINNY Rattler, MD  1:20 PM 01/14/2024

## 2024-01-14 NOTE — Plan of Care (Signed)
 Problem: Pain interferes with ability to perform ADL  Goal: Pain at adequate level as identified by patient  Flowsheets (Taken 01/14/2024 0422)  Pain at adequate level as identified by patient:   Identify patient comfort function goal   Assess for risk of opioid induced respiratory depression, including snoring/sleep apnea. Alert healthcare team of risk factors identified.   Assess pain on admission, during daily assessment and/or before any as needed intervention(s)   Reassess pain within 30-60 minutes of any procedure/intervention, per Pain Assessment, Intervention, Reassessment (AIR) Cycle   Evaluate if patient comfort function goal is met   Offer non-pharmacological pain management interventions   Consult/collaborate with Physical Therapy, Occupational Therapy, and/or Speech Therapy   Include patient/patient care companion in decisions related to pain management as needed   Evaluate patient's satisfaction with pain management progress   Consult/collaborate with Pain Service     Problem: Side Effects from Pain Analgesia  Goal: Patient will experience minimal side effects of analgesic therapy  Flowsheets (Taken 01/14/2024 0422)  Patient will experience minimal side effects of analgesic therapy:   Monitor/assess patient's respiratory status (RR depth, effort, breath sounds)   Prevent/manage side effects per LIP orders (i.e. nausea, vomiting, pruritus, constipation, urinary retention, etc.)   Evaluate for opioid-induced sedation with appropriate assessment tool (i.e. POSS)   Assess for changes in cognitive function     Problem: Moderate/High Fall Risk Score >5  Goal: Patient will remain free of falls  Flowsheets (Taken 01/14/2024 0422)  VH High Risk (Greater than 13):   ALL REQUIRED LOW INTERVENTIONS   ALL REQUIRED MODERATE INTERVENTIONS   RED HIGH FALL RISK SIGNAGE   BED ALARM WILL BE ACTIVATED WHEN THE PATEINT IS IN BED WITH SIGNAGE RESET BED ALARM   A CHAIR PAD ALARM WILL BE USED WHEN PATIENT IS UP SITTING  IN A CHAIR   PATIENT IS TO BE SUPERVISED FOR ALL TOILETING ACTIVITIES   A safety companion may be used when deemed appropriate by the Primary RN and Clinical Administrator   Keep door open for better visibility   Include family/significant other in multidisciplinary discussion regarding plan of care as appropriate   Request PT/OT therapy consult order from physician for patients with gait/mobility impairment   Use assistive devices     Problem: SCIP  Goal: SCIP measures are followed  Flowsheets (Taken 01/14/2024 0422)  SCIP measures are followed:   Administer antibiotics as ordered   VTE Prevention: Administer anticoagulant(s) and/or apply anti-embolism stockings/devices as ordered   Provide VTE prophylaxis within 24 hours of anesthesia end time (Note: SCD to lower extremities)     Problem: Inadequate Airway Clearance  Goal: Patent Airway maintained  Flowsheets (Taken 01/14/2024 0422)  Patent airway maintained:   Position patient for maximum ventilatory efficiency   Reinforce use of ordered respiratory interventions (i.e. CPAP, BiPAP, Incentive Spirometer, Acapella, etc.)   Reposition patient every 2 hours and as needed unless able to self-reposition   Provide adequate fluid intake to liquefy secretions   Suction secretions as needed  Goal: Normal respiratory rate/effort achieved/maintained  Flowsheets (Taken 01/14/2024 0422)  Normal respiratory rate/effort achieved/maintained: Plan activities to conserve energy: plan rest periods     Problem: Inadequate Airway Clearance  Goal: Normal respiratory rate/effort achieved/maintained  Flowsheets (Taken 01/14/2024 0422)  Normal respiratory rate/effort achieved/maintained: Plan activities to conserve energy: plan rest periods     Problem: Infection Prevention  Goal: Free from infection  Flowsheets (Taken 01/14/2024 0422)  Free from infection:   Monitor/assess vital signs  Assess incision for evidence of healing   Monitor/assess lab values and report abnormal values    Monitor/assess output from surgical drain if present   Encourage/assist patient to turn, cough and perform deep breathing every 2 hours   Assess for signs and symptoms of infection     Problem: Impaired Mobility  Goal: Mobility/Activity is maintained at optimal level for patient  Flowsheets (Taken 01/14/2024 0422)  Mobility/activity is maintained at optimal level for patient:   Encourage independent activity per ability   Consult/collaborate with Physical Therapy and/or Occupational Therapy

## 2024-01-14 NOTE — Nursing Progress Note (Signed)
 Received report from outgoing RN; assumed care of pt at 1200. Pt AOx4, VSS, RA, no c/o N/V/D. NGT R nare connected to LIWS. Anterior abdomen x3 lap sites closed with dermabond open to air. FSBG 327; covered with 4u insulin  lispro per SSI on MAR. Pt states pain 0/10, resting comfortably in bed with SCDs on. Family member present at bedside. Call bell, phone, and personal items within reach.

## 2024-01-15 LAB — BASIC METABOLIC PANEL
Anion Gap: 12 (ref 5.0–15.0)
BUN: 28 mg/dL (ref 9–28)
CO2: 28 meq/L (ref 17–29)
Calcium: 8.7 mg/dL (ref 7.9–10.2)
Chloride: 105 meq/L (ref 99–111)
Creatinine: 1.3 mg/dL (ref 0.5–1.5)
GFR: 53.8 mL/min/{1.73_m2} — ABNORMAL LOW (ref >=60.0)
Glucose: 225 mg/dL — ABNORMAL HIGH (ref 70–100)
Potassium: 4.6 meq/L (ref 3.5–5.3)
Sodium: 145 meq/L (ref 135–145)

## 2024-01-15 LAB — CBC
Absolute nRBC: 0 10*3/uL (ref <=0.00)
Hematocrit: 32.7 % — ABNORMAL LOW (ref 37.6–49.6)
Hemoglobin: 10.6 g/dL — ABNORMAL LOW (ref 12.5–17.1)
MCH: 29.5 pg (ref 25.1–33.5)
MCHC: 32.4 g/dL (ref 31.5–35.8)
MCV: 91.1 fL (ref 78.0–96.0)
MPV: 11 fL (ref 8.9–12.5)
Platelet Count: 254 10*3/uL (ref 142–346)
RBC: 3.59 10*6/uL — ABNORMAL LOW (ref 4.20–5.90)
RDW: 16 % — ABNORMAL HIGH (ref 11–15)
WBC: 6.02 10*3/uL (ref 3.10–9.50)
nRBC %: 0 /100{WBCs} (ref <=0.0)

## 2024-01-15 LAB — WHOLE BLOOD GLUCOSE POCT
Whole Blood Glucose POCT: 140 mg/dL — ABNORMAL HIGH (ref 70–100)
Whole Blood Glucose POCT: 150 mg/dL — ABNORMAL HIGH (ref 70–100)
Whole Blood Glucose POCT: 189 mg/dL — ABNORMAL HIGH (ref 70–100)
Whole Blood Glucose POCT: 206 mg/dL — ABNORMAL HIGH (ref 70–100)
Whole Blood Glucose POCT: 235 mg/dL — ABNORMAL HIGH (ref 70–100)
Whole Blood Glucose POCT: 343 mg/dL — ABNORMAL HIGH (ref 70–100)

## 2024-01-15 MED ORDER — PANTOPRAZOLE SODIUM 40 MG IV SOLR
40.0000 mg | Freq: Every day | INTRAVENOUS | Status: DC
Start: 2024-01-15 — End: 2024-01-16
  Administered 2024-01-15 – 2024-01-16 (×2): 40 mg via INTRAVENOUS
  Filled 2024-01-15 (×2): qty 40

## 2024-01-15 MED ORDER — LACTATED RINGERS IV SOLN
INTRAVENOUS | Status: AC
Start: 2024-01-15 — End: 2024-01-16

## 2024-01-15 NOTE — Plan of Care (Addendum)
 SHIFT EVENTS   Pt AOx4, reports 0/10 pain to abdomen.  Surgical site C/D/I. Dermabond applied to site. Patient refused to ambulated OOB this evening. Educated on the importance of ambulation. Pt NPO, not passing flatus, no bm. IVF cont. Iv Abx given. BG: 200s. Health aide at bedside.   PMP activity: Bed mobility  NGT Output: 400 mL.    Purposeful rounding implemented.   Pt remained free from falls and injury throughout shift.  Safety and fall risk precautions in place.         D/C Plan: TBD     VITAL SIGNS     Vitals:    01/15/24 0355   BP: 158/83   Pulse: 94   Resp: 16   Temp: 98.1 F (36.7 C)   SpO2: 96%       Temp  Min: 97.9 F (36.6 C)  Max: 98.1 F (36.7 C)  Pulse  Min: 81  Max: 117  Resp  Min: 15  Max: 17  BP  Min: 129/67  Max: 158/83  SpO2  Min: 94 %  Max: 97 %      Intake/Output Summary (Last 24 hours) at 01/15/2024 0535  Last data filed at 01/15/2024 0400  Gross per 24 hour   Intake 220 ml   Output 1250 ml   Net -1030 ml          CARE PLAN      Problem: Diabetes: Glucose Imbalance  Goal: Blood glucose stable at established goal  Outcome: Progressing  Flowsheets (Taken 01/15/2024 0358)  Blood glucose stable at established goal:   Monitor lab values   Monitor intake and output.  Notify LIP if urine output is < 30 mL/hour.   Follow fluid restrictions/IV/PO parameters   Include patient/family in decisions related to nutrition/dietary selections   Assess for hypoglycemia /hyperglycemia   Monitor/assess vital signs   Coordinate medication administration with meals, as indicated   Ensure adequate hydration   Ensure patient/family has adequate teaching materials   Ensure appropriate consults are obtained (Nutrition, Diabetes Education, and Case Management)   Ensure appropriate diet and assess tolerance     Problem: Altered GI Function  Goal: Fluid and electrolyte balance are achieved/maintained  01/15/2024 0358 by Carle Evener, RN  Outcome: Progressing  Flowsheets (Taken 01/15/2024 0358)  Fluid and electrolyte  balance are achieved/maintained:   Monitor/assess lab values and report abnormal values   Assess and reassess fluid and electrolyte status   Observe for cardiac arrhythmias   Monitor for muscle weakness  01/15/2024 0357 by Carle Evener, RN  Outcome: Progressing     Problem: Infection Prevention  Goal: Free from infection  01/15/2024 0358 by Carle Evener, RN  Outcome: Progressing  Flowsheets (Taken 01/15/2024 0358)  Free from infection:   Encourage/assist patient to turn, cough and perform deep breathing every 2 hours   Assess incision for evidence of healing   Monitor/assess output from surgical drain if present   Assess for signs and symptoms of infection   Monitor/assess lab values and report abnormal values   Monitor/assess vital signs  01/15/2024 0357 by Carle Evener, RN  Outcome: Progressing

## 2024-01-15 NOTE — Progress Notes (Signed)
 NG tube clamped @1120  until 1500.  Will recheck output by reconnecting to suction.

## 2024-01-15 NOTE — Progress Notes (Signed)
 [facility]  PROGRESS NOTE      Patient: Roy Delgado.  Date: 01/15/2024   LOS: 2 Days  Admission Date: 01/13/2024   MRN: [id number]  Attending: Leni Bernerd DELENA Berta, MD  When on service as the attending, please contact me on Epic Secure Chat from 7 AM - 7 PM for non-urgent issues. For urgent matters use XTend page from 7 AM - 7 PM.       ASSESSMENT/PLAN     Junette LELON Orlin Kann. is a 88 y.o. male admitted with Volvulus    Patient Active Hospital Problem List:    #Upper small bowel Volvulus status post laparotomy repair  - Doing well post-op, passing gas  - Remain NPO for now per gensurg, continue NGT to LCWS  - Continue fluids  - Continue prn pain meds and antiemetics     #DM1/2  #Previous h/o DKA per family  -Continue basal-bolus insulin  regimen, will continue long-acting insulin  despite n.p.o. status due to history of DKA.    Blood glucose monitoring 4 hourly while n.p.o.     cystitis-  - continue ceftriaxone  for 5 days  - culture pending     HTN  - holding meds as NPO, will restart when able to tolerate NPO  - will only treat HTN if symptomatic           Nutrition: NPO                            Recent Labs   Lab 01/15/24  0818 01/14/24  0404 01/13/24  1447   Hemoglobin 10.6* 10.8* 12.6   Hematocrit 32.7* 35.1* 38.0   MCV 91.1 92.1 87.2   WBC 6.02 12.45* 7.18   Platelet Count 254 272 327         Anemia Diagnosis: Unspecified Anemia (currently unable to determine type)      Cr Baseline Estimation (minimum in last 3 months): 1.2 mg/dL  Maximum Cr in last 36 hours: 1.5 mg/dL    Recent Labs (Last 3 Months)     01/15/24  0818 01/14/24  1038 01/14/24  0404   Creatinine 1.3 1.5 1.3   BUN 28 36* 34*       Acute Kidney Injury Diagnosis: Acute Kidney Injury, present on admission         Code Status: Full Code    Dispo: pending further workup    Family Contact: Nat Signa, daughter.      DVT Prophylaxis:   Current Facility-Administered Medications (Includes Only Anticoagulants, Misc. Hematological)   Medication  Dose Route Last Admin   . enoxaparin  (LOVENOX ) syringe 40 mg  40 mg Subcutaneous 40 mg at 01/15/24 9074          CHART  REVIEW & DISCUSSION     The following chart items were reviewed as of 9:58 AM on 01/15/24:  [x]  Lab Results [x]  Imaging Results   [x]  Problem List  [x]  Current Orders [x]  Current Medications  [x]  Allergies  [x]  Code Status [x]  Previous Notes   []  SDoH    The management and plan of care for this patient was discussed with the following specialty consultants:  []  Cardiology  []  Gastroenterology                 []  Infectious Disease  []  Pulmonology []  Neurology                []  Nephrology  []   Neurosurgery []  Orthopedic Surgery  []  Heme/Onc  [x]  General Surgery []  Psychiatry                                   []  Palliative    SUBJECTIVE     Junette LELON Signa Mickey. Seen and examined at bedside, reports his pain has resolved and he is feeling improved.  Denies any cough fever chills.  Has not had any bowel movement but has been passing flatus.    MEDICATIONS     Current Facility-Administered Medications   Medication Dose Route Frequency   . acetaminophen   1,000 mg Intravenous Q6H   . cefTRIAXone   2 g Intravenous Q24H   . enoxaparin   40 mg Subcutaneous Daily   . insulin  glargine  20 Units Subcutaneous QAM    Or   . insulin  glargine  10 Units Subcutaneous QAM   . insulin  lispro  1-5 Units Subcutaneous Q4H SCH   . pantoprazole   40 mg Intravenous Daily       PHYSICAL EXAM     Vitals:    01/15/24 0745   BP: 144/69   Pulse: 84   Resp: 15   Temp: 98.1 F (36.7 C)   SpO2: 95%       Temperature: Temp  Min: 97.9 F (36.6 C)  Max: 98.1 F (36.7 C)  Pulse: Pulse  Min: 81  Max: 117  Respiratory: Resp  Min: 15  Max: 17  Non-Invasive BP: BP  Min: 135/71  Max: 158/83  Pulse Oximetry SpO2  Min: 94 %  Max: 96 %    Intake and Output Summary (Last 24 hours) at Date Time    Intake/Output Summary (Last 24 hours) at 01/15/2024 0958  Last data filed at 01/15/2024 0600  Gross per 24 hour   Intake 180 ml   Output 1250 ml   Net  -1070 ml     GEN APPEARANCE: Normal;  A&OX3  HEENT: PERLA; EOMI; Conjunctiva Clear.  NGT in place  NECK: Supple; No bruits.  CVS: RRR, S1, S2; No M/G/R  LUNGS: CTAB; No Wheezes; No Rhonchi: No rales  ABD: Soft; No TTP; + Normoactive BS  EXT: No edema; Pulses 2+ and intact  NEURO: CN 2-12 intact; No Focal neurological deficits    LABS     Recent Labs   Lab 01/15/24  0818 01/14/24  0404 01/13/24  1447   WBC 6.02 12.45* 7.18   RBC 3.59* 3.81* 4.36   Hemoglobin 10.6* 10.8* 12.6   Hematocrit 32.7* 35.1* 38.0   MCV 91.1 92.1 87.2   Platelet Count 254 272 327       Recent Labs   Lab 01/15/24  0818 01/14/24  1038 01/14/24  0404 01/13/24  1447   Sodium 145 140 140 138   Potassium 4.6 4.6 5.1 4.5   Chloride 105 105 103 102   CO2 28 23 19 25    BUN 28 36* 34* 26   Creatinine 1.3 1.5 1.3 1.2   Glucose 225* 322* 433* 240*   Calcium 8.7 8.6 8.8 9.3   Magnesium   --   --  1.9  --        Recent Labs   Lab 01/14/24  0404 01/13/24  1447   ALT  --  <6   AST (SGOT)  --  14   Bilirubin, Total  --  0.6   Albumin  3.2* 3.5   Alkaline Phosphatase  --  159*                   Microbiology Results (last 15 days)       Procedure Component Value Units Date/Time    Culture, Blood, Aerobic And Anaerobic [[id number]] Collected: 01/13/24 1743    Order Status: Completed Specimen: Blood, Venous Updated: 01/14/24 2200     Culture Blood No growth at 1 day    Culture, Urine [[id number]] Collected: 01/13/24 1633    Order Status: Completed Specimen: Urine, Clean Catch Updated: 01/14/24 1756     Culture Urine Culture requires further incubation, results to follow.    COVID-19 (SARS-CoV-2) and Influenza A/B, NAA (Liat)- Admission [[id number]]  (Normal) Collected: 01/13/24 1449    Order Status: Completed Specimen: Swab from Anterior Nares Updated: 01/13/24 1525     SARS-CoV-2 (COVID-19) RNA Not Detected     Influenza A RNA Not Detected     Influenza B RNA Not Detected    Narrative:      A result of Detected indicates POSITIVE for the presence of viral RNA  A  result of Not Detected indicates NEGATIVE for the presence of viral RNA    Test performed using the Roche cobas Liat SARS-CoV-2 & Influenza A/B assay. This is a multiplex real-time RT-PCR assay for the detection of SARS-CoV-2, influenza A, and influenza B virus RNA. Viral nucleic acids may persist in vivo, independent of viability. Detection of viral nucleic acid does not imply the presence of infectious virus, or that virus nucleic acid is the cause of clinical symptoms. Negative results do not preclude SARS-CoV-2, influenza A, and/or influenza B infection and should not be used as the sole basis for diagnosis, treatment or other patient management decisions. Invalid results may be due to inhibiting substances in the specimen and recollection should occur.              RADIOLOGY     XR Chest AP Portable    Result Date: 01/13/2024  Nasogastric tube terminates in the proximal stomach Alphonza JONETTA Cohn, MD 01/13/2024 5:39 PM    CT Abd/Pelvis with IV Contrast    Result Date: 01/13/2024  1. Dilated fluid-filled stomach and duodenum with obstruction in the proximal jejunum where there is slight swirling at the root of the mesentery suspicious for midgut volvulus. 2. Nonobstructive left renal calculi and small renal cysts. COMMENT: This urgent result was discussed with and acknowledged by Dr. Von, at 1612 hours on 01/13/2024. Levada Cinnamon, MD 01/13/2024 4:15 PM   Echo Results       None          No results found for this or any previous visit.    Signed,  Leni Bernerd DELENA Berta, MD  9:58 AM 01/15/2024

## 2024-01-15 NOTE — PT Eval Note (Signed)
 Physical Therapy Eval and Treatment  Roy Delgado.      Post Acute Care Therapy Recommendations   Discharge Recommendations:  Home with supervision, Home with home health PT, Home with home health OT (pt with caregiver support in place, family also able to assist as needed w/ ADLs)    DME needs IF patient is discharging home: No additional equipment/DME recommended at this time, Patient already has needed equipment    Therapy discharge recommendations may change with patient status.  Please refer to most recent note for up-to-date recommendations.    Unit: [address] [facility]  Bed: A2614/A2614-01    ___________________________________________________    Time of Evaluation and Treatment:  Time Calculation   PT Received On: 01/15/24  Start Time: 0820  Stop Time: 0903  Time Calculation (min): 43 min       Evaluation Time: 10 minutes  Treatment Time: 33 minutes    PT Visit Number: 1    Consult received for Roy Delgado. for PT Evaluation and Treatment.  Patient's medical condition is appropriate for Physical therapy intervention at this time.    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 is a 88 y.o. male admitted on 01/13/2024 with DM1, peripheral neuropathy, glaucoma (diabetic proliferative eye disease), HTN, prostate cancer hx who presented yesterday with severe, acute nonbloody vomiting with associated diffuse cramping nonradiating abdominal pain since 3 AM today. Patient also notes frequent urination and strong, malodorous smelling urine for the past few days.  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     Patient now s/p LAPAROSCOPY, DIAGNOSTIC, LYSIS OF ADHESIONS: 49320 (CPT)  LAPAROSCOPY, REDUCTION OF VOLVULUS, REPAIR INTERNAL HERNIA: 55761 (CPT) on 01/13/2024.      Problem List[1]    Past Medical/Surgical History:  Medical History[2]  Past Surgical History[3]    X-Rays/Tests/Labs:  Lab Results   Component Value Date/Time    HGB 10.6 (L) 01/15/2024 08:18 AM    HGB 13.1 04/12/2014 12:37 PM    HCT 32.7 (L) 01/15/2024 08:18 AM    HCT 38.8 (L) 04/12/2014 12:37 PM    K 4.6 01/15/2024 08:18 AM    K 4.3 04/12/2014 12:37 PM    NA 145 01/15/2024 08:18 AM    NA 144 04/12/2014 12:37 PM    TROPI 0.08 07/26/2010 01:55 AM    TROPI 0.04 07/25/2010 04:56 PM       All imaging reviewed, please see chart for details.    Social History:  Prior Level of Function  Prior level of function: Ambulates with assistive device, Needs assistance with ADLs  Assistive Device: Other (Comment) (rollator)  Baseline Activity Level: Community ambulation  Ambulated 100 feet or more prior to admission: Yes  Driving: does not drive  DME Currently at Home: ADL- Grab Bars, Other (Comment) (rollator, toilet riser)    Home Living Arrangements  Living Arrangements: Children (Dtr)  Type of Home: House  Home Layout: Multi-level, Able to live on main level with bedroom/bathroom (8 STE to main level)  Bathroom Shower/Tub:  (Pt sponge baths only)  Bathroom Toilet: Raised  Bathroom Equipment: Grab bars around toilet, Warden/ranger Accessibility: Accessible, Accessible via walker  DME Currently at Home: ADL- Grab Bars, Other (Comment) (rollator, toilet riser)  Home Living - Notes / Comments: Pt's aide present during session. Mon  and Fri pt has aide for 5 1/2 hours. Tues, Wed, Thurs, aide is there for 7 hours. Pt requires assist to stand up and aide/family is usually present for ambulation.      Subjective:  Patient is agreeable to participation in the therapy session. Nursing clears patient for therapy.      Pain Assessment  Pain Assessment: No/denies pain      Objective:  Observation of Patient/Vital Signs:    Vitals: Supine at rest    01/15/24   BP: 144/69   Pulse: 84   Resp: 15   Temp: 98.1 F (36.7 C)   SpO2: 95%              Cognitive Status and Neuro Exam:  Cognition/Neuro Status  Arousal/Alertness: Appropriate responses to stimuli  Attention Span: Appears intact  Orientation Level: Oriented X4  Memory: Appears intact  Following Commands: Follows all commands and directions without difficulty  Safety Awareness: minimal verbal instruction  Insights: Fully aware of deficits;Educated in safety awareness  Problem Solving: supervision  Behavior: attentive;calm;cooperative  Motor Planning: intact  Coordination: intact    Musculoskeletal Examination  Gross ROM  Neck/Trunk ROM: within functional limits  Right Upper Extremity ROM: within functional limits  Left Upper Extremity ROM: within functional limits  Right Lower Extremity ROM: within functional limits  Left Lower Extremity ROM: within functional limits  Gross Strength  Right Upper Extremity Strength: 3+/5  Left Upper Extremity Strength: 3+/5  Right Lower Extremity Strength: 4-/5  Left Lower Extremity Strength: 4-/5       Functional Mobility:  Functional Mobility  Supine to Sit: Minimal Assist;Increased Time;Increased Effort;using bedrail;HOB raised;to Right (for assist w/ trunk 2/2 pain w/ movement)  Scooting to EOB: Stand by Assist  Sit to Stand: Contact Guard Assist;Increased Time;Increased Effort;using bedrail;bed elevated;with instruction for hand placement to increase safety (using RW)  Stand to Sit: Contact Guard Assist  Transfers  Bed to Chair: Cabin Crew Used for Functional Transfer: front-wheeled walker  Locomotion  Ambulation: Advertising Account Executive Assist;with front-wheeled walker (61ft x 2)  Pattern: Narrow BOS;Step through;decreased step length;decreased cadence     Balance  Balance  Sitting - Static: Good  Sitting - Dynamic: Good  Standing  - Static: Fair (w/ RW)  Standing - Dynamic: Fair (w/ RW)    Participation and Activity Tolerance  Participation and Endurance  Participation Effort: good  [place] Dyspnea Scale: 0 Dyspnea         Educated the Patient to role of physical therapy, plan of care, goals of therapy and safety with mobility and ADLs, energy conservation techniques, pursed lip breathing, home safety with verbalized understanding , demonstrated understanding, and teach back understanding.    Patient left in bedside chair. Pt left with all appropriate medical equipment in place, call bell and pt personal items/needs within reach, and with alarm in place. RN notified of session outcome. Notified CM team and attending of d/c recommendation via secure chat.      Assessment:  Roy Antrim. is a 88 y.o. male admitted 01/13/2024.  PT Assessment  Assessment: Decreased balance;Gait impairment;Decreased functional mobility;Decreased endurance/activity tolerance;Decreased safety/judgement during functional mobility;Decreased LE strength;Decreased UE strength  Prognosis: Good;With continued PT status  post acute discharge;Ongoing PT assessment needed  Progress: Slow progress, decreased activity tolerance        PT/OT discharge recommendations differ 2/2 activity tolerance, command following, and change in functional status. Other discipline aware.        Treatment: Practiced sit to stands from EOB x 2 reps with fair eccentric control, pt did require increased instruction for proper hand placement to maximize safety. Pt able to ambulate 2 laps in the room, with no LOB noted, however was limited by fatigue required a seated rest break. Pt was educated on PLBing and pacing to help maximize activity tolerance. He also required increased verbal facilitation for upright posture and gait mechanics to maximize safety. Vitals were re-assessed throughout and remained stable. To prepare for D/C home pt performed standing marches x10 to simulate  stair negotiation with UE support.  All OOB mobility performed with use of gait belt for increased pt and therapist safety. Reviewed PT plan of care and D/C recommendation. Reinforced  use of nursing assistance for safety and use of call bell. Pt verbalized understanding and agreement with plan.  Addressed all questions and concerns.             Plan:  Treatment/Interventions: Teaching laboratory technician, LE strengthening/ROM, Functional transfer training, Neuromuscular re-education, Stair training, Gait training, Exercise  PT Frequency: follow-up visit only  Risks/Benefits/POC Discussed with Pt/Family: With patient    PMP Activity: Step 6 - Walks in Room  Distance Walked (ft) (Step 6,7): 40 Feet (66ft x 2)      Goals:  Goals  Goal Formulation: With patient  Time for Goal Acheivement: By time of discharge  Goals: Select goal  Pt Will Go Supine To Sit: with stand by assist, to maximize functional mobility and independence, Partly met (progressing, still req's min A)  Pt Will Perform Sit To Supine: with stand by assist, to maximize functional mobility and independence  Pt Will Perform Sit to Stand: with supervision, to maximize functional mobility and independence, Partly met (still req's CGA)  Pt Will Transfer Bed/Chair: with rolling walker, with supervision, to maximize functional mobility and independence, Partly met (still req's CGA)  Pt Will Ambulate: 51-100 feet, with rolling walker, with supervision, to maximize functional mobility and independence, Partly met (still req's CGA)  Pt Will Go Up / Down Stairs: 6-10 stairs, with supervision, With rail, to maximize functional mobility and independence, Partly met (patient able to perform standing marches to simulate stairs)    Emery Fury, PT, DPT  Physical Therapist 3  Physical Medicine & Rehabilitation  [phone]  Mon-Fri 6:30-3pm  01/15/2024 9:35 AM               [facility]  Patient: Roy Savidge. MRN#: [id number]  Unit: [address] [facility]  Bed: A2614/A2614-01         [1]   Patient Active Problem List  Diagnosis    Prostate cancer    Volvulus    Chronic systolic CHF (congestive heart failure), NYHA class 2    Benign essential hypertension    Chronic pain disorder    Type II or unspecified type diabetes mellitus with renal manifestations, not stated as uncontrolled(250.40)    Cystitis   [2]   Past Medical History:  Diagnosis Date    Diabetes     Glaucoma     Hypertension    [3]   Past Surgical History:  Procedure Laterality Date    appendectomy 1991      LAPAROSCOPY,  DIAGNOSTIC N/A 01/13/2024    Procedure: LAPAROSCOPY, DIAGNOSTIC, LYSIS OF ADHESIONS;  Surgeon: Soyla Dallas BROCKS, DO;  Location: [facility];  Service: General;  Laterality: N/A;    LAPAROSCOPY, DIAGNOSTIC BARIATRIC  01/13/2024    Procedure: LAPAROSCOPY, REDUCTION OF VOLVULUS, REPAIR INTERNAL HERNIA;  Surgeon: Soyla Dallas BROCKS, DO;  Location: [facility];  Service: General;;

## 2024-01-15 NOTE — Plan of Care (Signed)
 Pt A+Ox4, vitals stable, RA.  Denies any pain, no SOB.  NG tube clamped 1230 - 1500, pt tolerated.  NG output after clamping was <100 cc/ pt also had BM, team notified, NG removed per order.  Pt started on clear liquid, pt tough to take it slowly and stop eating/drinking if nauseas.  Safety needs and precautions in place.    Problem: Pain interferes with ability to perform ADL  Goal: Pain at adequate level as identified by patient  Outcome: Progressing  Flowsheets (Taken 01/15/2024 1543)  Pain at adequate level as identified by patient:   Identify patient comfort function goal   Assess for risk of opioid induced respiratory depression, including snoring/sleep apnea. Alert healthcare team of risk factors identified.   Assess pain on admission, during daily assessment and/or before any as needed intervention(s)   Reassess pain within 30-60 minutes of any procedure/intervention, per Pain Assessment, Intervention, Reassessment (AIR) Cycle   Evaluate if patient comfort function goal is met   Evaluate patient's satisfaction with pain management progress     Problem: Side Effects from Pain Analgesia  Goal: Patient will experience minimal side effects of analgesic therapy  Outcome: Progressing  Flowsheets (Taken 01/15/2024 1543)  Patient will experience minimal side effects of analgesic therapy:   Monitor/assess patient's respiratory status (RR depth, effort, breath sounds)   Assess for changes in cognitive function   Prevent/manage side effects per LIP orders (i.e. nausea, vomiting, pruritus, constipation, urinary retention, etc.)   Evaluate for opioid-induced sedation with appropriate assessment tool (i.e. POSS)     Problem: Moderate/High Fall Risk Score >5  Goal: Patient will remain free of falls  Outcome: Progressing  Flowsheets (Taken 01/15/2024 0000 by Carle Evener, RN)  High (Greater than 13):   HIGH-Visual cue at entrance to patient's room   HIGH-Bed alarm on at all times while patient in bed   HIGH-Utilize chair  pad alarm for patient while in the chair   HIGH-Apply yellow Fall Risk arm band   HIGH-Pharmacy to initiate evaluation and intervention per protocol   HIGH-Initiate use of floor mats as appropriate   HIGH-Consider use of low bed     Problem: Infection Prevention  Goal: Free from infection  Outcome: Progressing  Flowsheets (Taken 01/15/2024 0358 by Carle Evener, RN)  Free from infection:   Encourage/assist patient to turn, cough and perform deep breathing every 2 hours   Assess incision for evidence of healing   Monitor/assess output from surgical drain if present   Assess for signs and symptoms of infection   Monitor/assess lab values and report abnormal values   Monitor/assess vital signs     Problem: Impaired Mobility  Goal: Mobility/Activity is maintained at optimal level for patient  Outcome: Progressing  Flowsheets (Taken 01/14/2024 0422 by Jordis Eis, RN)  Mobility/activity is maintained at optimal level for patient:   Encourage independent activity per ability   Consult/collaborate with Physical Therapy and/or Occupational Therapy     Problem: Compromised Activity/Mobility  Goal: Activity/Mobility Interventions  Outcome: Progressing  Flowsheets (Taken 01/15/2024 0000 by Carle Evener, RN)  Activity/Mobility Interventions: Pad bony prominences, TAP Seated positioning system when OOB, Promote PMP, Reposition q 2 hrs / turn clock, Offload heels     Problem: Altered GI Function  Goal: Fluid and electrolyte balance are achieved/maintained  Outcome: Progressing  Flowsheets (Taken 01/15/2024 0358 by Carle Evener, RN)  Fluid and electrolyte balance are achieved/maintained:   Monitor/assess lab values and report abnormal values   Assess and reassess fluid and  electrolyte status   Observe for cardiac arrhythmias   Monitor for muscle weakness

## 2024-01-15 NOTE — Progress Notes (Signed)
 PROGRESS NOTE  [facility]    Date Time: 01/15/24 10:01 AM  Patient Name: Roy Delgado, Roy Delgado.      ASSESSMENT:   2 Days Post-Op S/P Procedure(s):  LAPAROSCOPY, DIAGNOSTIC, LYSIS OF ADHESIONS  LAPAROSCOPY, REDUCTION OF VOLVULUS, REPAIR INTERNAL HERNIA    Elan W Shonn Farruggia. is a 88 y.o. male with internal hernia causing bowel obstruction (2/2 adhesive tissue), now s/p diagnostic laparoscopy with LOA and reduction of jejunal volvulus. Unclear if patient passing flatus, NGT output remains high, will re-evaluate in the afternoon for bowel function    PLAN:   - NPO   - NGT clamp trial, return to suction at 3pm  - mIVF  - multimodal pain control  - encourage ambulation, can clamp NGT to ambulate  - patient to be out of bed to chair at least 1 time today  - protonix  added for dark NGT output    Attending addendum: This patient was personally seen and examined by me and I agree with the assessment and plan above.  All notes, labs, and imaging if performed were reviewed.  The patient was seen on the date the note was written.    Passing flatus  NGT output decreasing  Clamp trial now for possible removal this afternoon     Adaliah Hiegel C. Janella, MD, MPH  [facility]  [address]  [place], [place] [address]  [phone]         SUBJECTIVE:   No acute overnight events. Doing well this morning. Unsure if he is passing gas. Thinks he might be when he is urinating. Would like to get out of bed.        OBJECTIVE:   Current Vitals:   Vitals:    01/15/24 0745   BP: 144/69   Pulse: 84   Resp: 15   Temp: 98.1 F (36.7 C)   SpO2: 95%       Intake and Output Summary (Last 24 hours):  I/O last 3 completed shifts:  In: 1340 [I.V.:1000; NG/GT:340]  Out: 2155 [Urine:1350; Emesis/NG output:800; Blood:5]    Labs:     Results       Procedure Component Value Units Date/Time    Basic Metabolic Panel [[id number]]  (Abnormal) Collected: 01/15/24 0818    Specimen: Blood, Venous Updated: 01/15/24 0847     Glucose  225 mg/dL      BUN 28 mg/dL      Creatinine 1.3 mg/dL      Calcium 8.7 mg/dL      Sodium 854 mEq/L      Potassium 4.6 mEq/L      Chloride 105 mEq/L      CO2 28 mEq/L      Anion Gap 12.0     GFR 53.8 mL/min/1.73 m2     CBC without Differential [[id number]]  (Abnormal) Collected: 01/15/24 0818    Specimen: Blood, Venous Updated: 01/15/24 0830     WBC 6.02 x10 3/uL      Hemoglobin 10.6 g/dL      Hematocrit 67.2 %      Platelet Count 254 x10 3/uL      MPV 11.0 fL      RBC 3.59 x10 6/uL      MCV 91.1 fL      MCH 29.5 pg      MCHC 32.4 g/dL      RDW 16 %      nRBC % 0.0 /100 WBC      Absolute nRBC  0.00 x10 3/uL     Whole Blood Glucose POCT [[id number]]  (Abnormal) Collected: 01/15/24 0740    Specimen: Blood, Capillary Updated: 01/15/24 0747     Whole Blood Glucose POCT 189 mg/dL     Whole Blood Glucose POCT [[id number]]  (Abnormal) Collected: 01/15/24 0352    Specimen: Blood, Capillary Updated: 01/15/24 0357     Whole Blood Glucose POCT 206 mg/dL     Whole Blood Glucose POCT [[id number]]  (Abnormal) Collected: 01/14/24 2352    Specimen: Blood, Capillary Updated: 01/15/24 0015     Whole Blood Glucose POCT 140 mg/dL     Culture, Blood, Aerobic And Anaerobic [[id number]] Collected: 01/13/24 1743    Specimen: Blood, Venous Updated: 01/14/24 2200     Culture Blood No growth at 1 day    Whole Blood Glucose POCT [[id number]]  (Abnormal) Collected: 01/14/24 1955    Specimen: Blood, Capillary Updated: 01/14/24 1958     Whole Blood Glucose POCT 147 mg/dL     Culture, Urine [[id number]] Collected: 01/13/24 1633    Specimen: Urine, Clean Catch Updated: 01/14/24 1756     Culture Urine Culture requires further incubation, results to follow.    Whole Blood Glucose POCT [[id number]]  (Abnormal) Collected: 01/14/24 1531    Specimen: Blood, Capillary Updated: 01/14/24 1557     Whole Blood Glucose POCT 147 mg/dL     Whole Blood Glucose POCT [[id number]]  (Abnormal) Collected: 01/14/24 1208    Specimen: Blood, Capillary Updated: 01/14/24  1238     Whole Blood Glucose POCT 260 mg/dL     Basic Metabolic Panel [[id number]]  (Abnormal) Collected: 01/14/24 1038    Specimen: Blood, Venous Updated: 01/14/24 1059     Glucose 322 mg/dL      BUN 36 mg/dL      Creatinine 1.5 mg/dL      Calcium 8.6 mg/dL      Sodium 859 mEq/L      Potassium 4.6 mEq/L      Chloride 105 mEq/L      CO2 23 mEq/L      Anion Gap 12.0     GFR 45.3 mL/min/1.73 m2             Rads:     Radiology Results (24 Hour)       ** No results found for the last 24 hours. **            Physical Exam:     Physical Exam  Constitutional:       General: He is not in acute distress.     Appearance: Normal appearance.   HENT:      Head: Normocephalic and atraumatic.   Cardiovascular:      Pulses: Normal pulses.   Pulmonary:      Effort: Pulmonary effort is normal.   Abdominal:      General: There is no distension.      Palpations: Abdomen is soft.      Tenderness: There is abdominal tenderness (appropriate). There is no guarding or rebound.      Comments: Laparoscopic incisions well approximated with surgical glue, c/d/i   Skin:     General: Skin is warm and dry.   Neurological:      Mental Status: He is alert.              Signed by:   Ortencia JINNY Portela, PA

## 2024-01-15 NOTE — OT Progress Note (Addendum)
 Occupational Therapy Treatment  Roy W Chuong Jr.        Post Acute Care Therapy Recommendations   Discharge Recommendations:  Home with supervision, Home with home health OT, Home with home health PT (return home with same level of caregiver assist PTA)    DME needs IF patient is discharging home: Hospital bed    Therapy discharge recommendations may change with patient status.  Please refer to most recent note for up-to-date recommendations.    Unit: [facility]  Bed: A2614/A2614-01    ___________________________________________________    Time of treatment:  OT Received On: 01/15/24  Start Time: 1448  Stop Time: 1521  Time Calculation (min): 33 min         OT Visit Number: 2      Precautions and Contraindications:         Personal Protective Equipment (PPE)  gloves and procedure mask    Updated Labs:  Lab Results   Component Value Date/Time    HGB 10.3 (L) 01/16/2024 04:01 AM    HGB 13.1 04/12/2014 12:37 PM    HCT 32.7 (L) 01/16/2024 04:01 AM    HCT 38.8 (L) 04/12/2014 12:37 PM    K 3.9 01/16/2024 04:01 AM    K 4.3 04/12/2014 12:37 PM    NA 138 01/16/2024 04:01 AM    NA 144 04/12/2014 12:37 PM    TROPI 0.08 07/26/2010 01:55 AM    TROPI 0.04 07/25/2010 04:56 PM       All imaging reviewed, please see chart for details.    Subjective:    .  I need to use the bathroom.       Patient's medical condition is appropriate for Occupational Therapy intervention at this time.  Patient is agreeable to participation in the therapy session. Nursing clears patient for therapy.         Objective:  Observation of Patient/Vital Signs:     stable                (max a for thoroughness; Pt was able to perform pericare SUP level when seated)         Educated the Patient to role of occupational therapy, plan of care, goals of therapy and safety with mobility and ADLs, discharge instructions, home safety with verbalized understanding .     Patient left in bed. Pt left with all appropriate medical equipment in place, call  bell and pt personal items/needs within reach, and with alarm in place. RN notified of session outcome.         Assessment:  Pt demonstrating improvement in functional mobility and ADL participation. No pain reported during session. He was motivated to ambulate during session. He did get fatigued following standing at the sink. Pt requesting his personal aide assist him with pericare. Therapist updating d/c rec to home with supervision and home health OT/PT. No additional inpatient OT needs as Pt is near ADL baseline and has good support at home.          PMP Activity: Step 5 - Chair;Step 6 - Walks in Room  Distance Walked (ft) (Step 6,7): 20 Feet      Plan:  OT Frequency Recommended: therapy discontinued  Goal Formulation: Patient      Time For Goal Achievement: by time of discharge  ADL Goals  Patient will groom self: Supervision, at edge of bed, Goal met  Mobility and Transfer Goals  Pt will transfer bed to [facility]: Minimal Assist, with  rolling walker, Goal met  Neuro Re-Ed Goals  Pt will sit at edge of bed: Supervision, to prepare for OOB tasks, Goal met                    Discharge from OT Acute Care Services.      Duwaine Donald, OTR/L    Physical Medicine and Rehabilitation  [facility]  [phone]    01/16/2024  4:08 PM    [facility]  Patient: Roy Delgado. MRN#: [id number]   Unit: [address] [facility] Bed: [phone]

## 2024-01-16 LAB — BASIC METABOLIC PANEL
Anion Gap: 11 (ref 5.0–15.0)
BUN: 21 mg/dL (ref 9–28)
CO2: 24 meq/L (ref 17–29)
Calcium: 8.7 mg/dL (ref 7.9–10.2)
Chloride: 103 meq/L (ref 99–111)
Creatinine: 1.1 mg/dL (ref 0.5–1.5)
GFR: >60 mL/min/{1.73_m2} (ref >=60.0)
Glucose: 258 mg/dL — ABNORMAL HIGH (ref 70–100)
Potassium: 3.9 meq/L (ref 3.5–5.3)
Sodium: 138 meq/L (ref 135–145)

## 2024-01-16 LAB — WHOLE BLOOD GLUCOSE POCT
Whole Blood Glucose POCT: 254 mg/dL — ABNORMAL HIGH (ref 70–100)
Whole Blood Glucose POCT: 279 mg/dL — ABNORMAL HIGH (ref 70–100)
Whole Blood Glucose POCT: 264 mg/dL — ABNORMAL HIGH (ref 70–100)
Whole Blood Glucose POCT: 330 mg/dL — ABNORMAL HIGH (ref 70–100)

## 2024-01-16 LAB — CBC
Absolute nRBC: 0 10*3/uL (ref <=0.00)
Hematocrit: 32.7 % — ABNORMAL LOW (ref 37.6–49.6)
Hemoglobin: 10.3 g/dL — ABNORMAL LOW (ref 12.5–17.1)
MCH: 28.5 pg (ref 25.1–33.5)
MCHC: 31.5 g/dL (ref 31.5–35.8)
MCV: 90.6 fL (ref 78.0–96.0)
MPV: 10.9 fL (ref 8.9–12.5)
Platelet Count: 241 10*3/uL (ref 142–346)
RBC: 3.61 10*6/uL — ABNORMAL LOW (ref 4.20–5.90)
RDW: 15 % (ref 11–15)
WBC: 6.43 10*3/uL (ref 3.10–9.50)
nRBC %: 0 /100{WBCs} (ref <=0.0)

## 2024-01-16 MED ORDER — SENNOSIDES-DOCUSATE SODIUM 8.6-50 MG PO TABS
2.0000 | ORAL_TABLET | Freq: Every day | ORAL | Status: DC
Start: 2024-01-16 — End: 2024-01-17
  Administered 2024-01-16: 2 via ORAL
  Filled 2024-01-16 (×2): qty 2

## 2024-01-16 MED ORDER — HYDROCHLOROTHIAZIDE 25 MG PO TABS
25.0000 mg | ORAL_TABLET | Freq: Every day | ORAL | Status: DC
Start: 2024-01-16 — End: 2024-01-17
  Administered 2024-01-16 – 2024-01-17 (×2): 25 mg via ORAL
  Filled 2024-01-16 (×2): qty 1

## 2024-01-16 MED ORDER — PANTOPRAZOLE SODIUM 40 MG PO TBEC
40.0000 mg | DELAYED_RELEASE_TABLET | Freq: Every morning | ORAL | Status: DC
Start: 2024-01-17 — End: 2024-01-17
  Administered 2024-01-17: 40 mg via ORAL
  Filled 2024-01-16: qty 1

## 2024-01-16 MED ORDER — METOPROLOL SUCCINATE ER 25 MG PO TB24
25.0000 mg | ORAL_TABLET | Freq: Every day | ORAL | Status: DC
Start: 2024-01-16 — End: 2024-01-17
  Administered 2024-01-16 – 2024-01-17 (×2): 25 mg via ORAL
  Filled 2024-01-16 (×2): qty 1

## 2024-01-16 MED ORDER — LOSARTAN POTASSIUM 25 MG PO TABS
25.0000 mg | ORAL_TABLET | Freq: Two times a day (BID) | ORAL | Status: DC
Start: 2024-01-16 — End: 2024-01-17
  Administered 2024-01-16 – 2024-01-17 (×3): 25 mg via ORAL
  Filled 2024-01-16 (×3): qty 1

## 2024-01-16 NOTE — Progress Notes (Signed)
 [facility]ITAL[facility]  PROGRESS NOTE      Patient: Roy Delgado.  Date: 01/16/2024   LOS: 3 Days  Admission Date: 01/13/2024   MRN: [id number]  Attending: Imagene DELENA Short, MD  When on service as the attending, please contact me on Epic Secure Chat from 7 AM - 7 PM for non-urgent issues. For urgent matters use XTend page from 7 AM - 7 PM.       ASSESSMENT/PLAN     Roy LELON Branton Einstein. is a 88 y.o. 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 status due to history of DKA.    Blood glucose monitoring 4 hourly while n.p.o.    Acute cystitis  - continue ceftriaxone  for 5 days, can be switched to oral at discharge      HTN  - Resume home Toprol , HCTZ and Cozaar         Nutrition: Regular                                Recent Labs   Lab 01/16/24  0401 01/15/24  0818 01/14/24  0404   Hemoglobin 10.3* 10.6* 10.8*   Hematocrit 32.7* 32.7* 35.1*   MCV 90.6 91.1 92.1   WBC 6.43 6.02 12.45*   Platelet Count 241 254 272         Anemia Diagnosis: Unspecified Anemia (currently unable to determine type)       Cr Baseline Estimation (minimum in last 3 months): 1.2 mg/dL  Maximum Cr in last 36 hours: 1.5 mg/dL    Recent Labs (Last 3 Months)     01/16/24  0401 01/15/24  0818 01/14/24  1038   Creatinine 1.1 1.3 1.5   BUN 21 28 36*       Acute Kidney Injury Diagnosis: Acute Kidney Injury, present on admission         Code Status: Full Code    Dispo: Home with supervision     Family Contact: Nat Signa, daughter.      DVT Prophylaxis:   Current Facility-Administered  Medications (Includes Only Anticoagulants, Misc. Hematological)   Medication Dose Route Last Admin   . enoxaparin  (LOVENOX ) syringe 40 mg  40 mg Subcutaneous 40 mg at 01/16/24 9040          CHART  REVIEW & DISCUSSION     The following chart items were reviewed as of 5:02 PM on 01/16/24:  [x]  Lab Results [x]  Imaging Results   [x]  Problem List  [x]  Current Orders [x]  Current Medications  [x]  Allergies  [x]  Code Status [x]  Previous Notes   []  SDoH    The management and plan of care for this patient was discussed with the following specialty consultants:  []  Cardiology  []  Gastroenterology                 []  Infectious Disease  []  Pulmonology []  Neurology                []   Nephrology  []  Neurosurgery []  Orthopedic Surgery  []  Heme/Onc  [x]  General Surgery []  Psychiatry                                   []  Palliative    SUBJECTIVE     Roy LELON Signa Mickey. Seen and examined at bedside, reports his pain has resolved and he is feeling improved.  Denies any cough fever chills.  Having BM, tolerating regular diet. Denies active pain     MEDICATIONS     Current Facility-Administered Medications   Medication Dose Route Frequency   . cefTRIAXone   2 g Intravenous Q24H   . enoxaparin   40 mg Subcutaneous Daily   . hydroCHLOROthiazide   25 mg Oral Daily   . insulin  glargine  20 Units Subcutaneous QAM    Or   . insulin  glargine  10 Units Subcutaneous QAM   . insulin  lispro  1-5 Units Subcutaneous Q4H SCH   . losartan   25 mg Oral Q12H SCH   . metoprolol  succinate XL  25 mg Oral Daily   . pantoprazole   40 mg Intravenous Daily   . senna-docusate  2 tablet Oral Daily at 1200       PHYSICAL EXAM     Vitals:    01/16/24 1546   BP: (!) 161/92   Pulse: (!) 117   Resp: 16   Temp: 98.1 F (36.7 C)   SpO2: 96%       Temperature: Temp  Min: 97.9 F (36.6 C)  Max: 99 F (37.2 C)  Pulse: Pulse  Min: 77  Max: 122  Respiratory: Resp  Min: 16  Max: 20  Non-Invasive BP: BP  Min: 150/75  Max: 191/92  Pulse Oximetry SpO2  Min: 95 %  Max: 99  %    Intake and Output Summary (Last 24 hours) at Date Time    Intake/Output Summary (Last 24 hours) at 01/16/2024 1702  Last data filed at 01/16/2024 1655  Gross per 24 hour   Intake 1000 ml   Output 2180 ml   Net -1180 ml     GEN APPEARANCE: Normal;  A&OX3  HEENT: PERLA; EOMI; Conjunctiva Clear.  NGT in place  NECK: Supple; No bruits.  CVS: RRR, S1, S2; No M/G/R  LUNGS: CTAB; No Wheezes; No Rhonchi: No rales  ABD: Soft; No TTP; + Normoactive BS  EXT: No edema; Pulses 2+ and intact  NEURO: CN 2-12 intact; No Focal neurological deficits    LABS     Recent Labs   Lab 01/16/24  0401 01/15/24  0818 01/14/24  0404   WBC 6.43 6.02 12.45*   RBC 3.61* 3.59* 3.81*   Hemoglobin 10.3* 10.6* 10.8*   Hematocrit 32.7* 32.7* 35.1*   MCV 90.6 91.1 92.1   Platelet Count 241 254 272       Recent Labs   Lab 01/16/24  0401 01/15/24  0818 01/14/24  1038 01/14/24  0404 01/13/24  1447   Sodium 138 145 140 140 138   Potassium 3.9 4.6 4.6 5.1 4.5   Chloride 103 105 105 103 102   CO2 24 28 23 19 25    BUN 21 28 36* 34* 26   Creatinine 1.1 1.3 1.5 1.3 1.2   Glucose 258* 225* 322* 433* 240*   Calcium 8.7 8.7 8.6 8.8 9.3   Magnesium   --   --   --  1.9  --  Recent Labs   Lab 01/14/24  0404 01/13/24  1447   ALT  --  <6   AST (SGOT)  --  14   Bilirubin, Total  --  0.6   Albumin  3.2* 3.5   Alkaline Phosphatase  --  159*                   Microbiology Results (last 15 days)       Procedure Component Value Units Date/Time    Culture, Blood, Aerobic And Anaerobic [[id number]] Collected: 01/13/24 1743    Order Status: Completed Specimen: Blood, Venous Updated: 01/15/24 2200     Culture Blood No growth at 2 days    Culture, Urine [[id number]] Collected: 01/13/24 1633    Order Status: Completed Specimen: Urine, Clean Catch Updated: 01/15/24 1735    Narrative:      >100,000 CFU/mL of multiple bacterial morphotypes present.  Possible contamination, appropriate recollection is requested if clinically indicated.    COVID-19 (SARS-CoV-2) and Influenza  A/B, NAA (Liat)- Admission [[id number]]  (Normal) Collected: 01/13/24 1449    Order Status: Completed Specimen: Swab from Anterior Nares Updated: 01/13/24 1525     SARS-CoV-2 (COVID-19) RNA Not Detected     Influenza A RNA Not Detected     Influenza B RNA Not Detected    Narrative:      A result of Detected indicates POSITIVE for the presence of viral RNA  A result of Not Detected indicates NEGATIVE for the presence of viral RNA    Test performed using the Roche cobas Liat SARS-CoV-2 & Influenza A/B assay. This is a multiplex real-time RT-PCR assay for the detection of SARS-CoV-2, influenza A, and influenza B virus RNA. Viral nucleic acids may persist in vivo, independent of viability. Detection of viral nucleic acid does not imply the presence of infectious virus, or that virus nucleic acid is the cause of clinical symptoms. Negative results do not preclude SARS-CoV-2, influenza A, and/or influenza B infection and should not be used as the sole basis for diagnosis, treatment or other patient management decisions. Invalid results may be due to inhibiting substances in the specimen and recollection should occur.              RADIOLOGY     XR Chest AP Portable    Result Date: 01/13/2024  Nasogastric tube terminates in the proximal stomach Alphonza JONETTA Cohn, MD 01/13/2024 5:39 PM    CT Abd/Pelvis with IV Contrast    Result Date: 01/13/2024  1. Dilated fluid-filled stomach and duodenum with obstruction in the proximal jejunum where there is slight swirling at the root of the mesentery suspicious for midgut volvulus. 2. Nonobstructive left renal calculi and small renal cysts. COMMENT: This urgent result was discussed with and acknowledged by Dr. Von, at 1612 hours on 01/13/2024. Levada Cinnamon, MD 01/13/2024 4:15 PM   Echo Results       None          No results found for this or any previous visit.    Signed,  Imagene DELENA Short, MD  5:02 PM 01/16/2024

## 2024-01-16 NOTE — PT Progress Note (Signed)
 Physical Therapy Note    Physical Therapy Treatment  Roy Delgado Signa Mickey.  Post Acute Care Therapy Recommendations   Discharge Recommendations:  Home with supervision, Home with home health PT, Home with home health OT (pt with caregiver support in place, family also able to assist as needed w/ ADLs)       DME needs IF patient is discharging home: Other (Comment) (pt declined hospital bed)    Therapy discharge recommendations may change with patient status.  Please refer to most recent note for up-to-date recommendations.    Unit: [facility]  Bed: A2614/A2614-01  ___________________________________________________    Time of treatment:  PT Received On: 01/16/24  Start Time: 1700  Stop Time: 1725  Time Calculation (min): 25 min         PT Visit Number: 2  ___________________________________________________    Precautions:   Precautions  Weight Bearing Status: no restrictions  Fall Risks: Medium  Other Precautions: NGT    Personal Protective Equipment (PPE)  eye shield/covering    Updated X-Rays/Tests/Labs:  Lab Results   Component Value Date/Time    HGB 10.3 (L) 01/16/2024 04:01 AM    HCT 32.7 (L) 01/16/2024 04:01 AM    K 3.9 01/16/2024 04:01 AM    NA 138 01/16/2024 04:01 AM       All imaging reviewed, please see chart for details.      Subjective:  Pt reports he does not want a hospital bed. He likes sleeping on the sofa. He can watch TV and the bathroom isn't too far away.    Pt reports his daughter is having stair lifts installed by the end of the month.         Pain Assessment  Pain Assessment: No/denies pain           Patient's medical condition is appropriate for Physical Therapy intervention at this time.  Patient is agreeable to participation in the therapy session. Nursing clears patient for therapy.    Objective:  PTA has reviewed chart and PT Eval for POC prior to treatment.    Pt under NAD    Cognition/Neuro Status  Behavior: attentive;calm;cooperative    Musculoskeletal Examination                         Functional Mobility  Supine to Sit: Stand by Assist;Increased Time;Increased Effort;to Right  Sit to Supine: Stand by Assist;to Left  Sit to Stand: Stand by Assist;Increased Time;Increased Effort  Stand to Sit: Stand by Assist     Locomotion  Ambulation: Stand by Assist;with front-wheeled walker  Pattern: R foot decreased clearance;L foot decreased clearance;decreased cadence;decreased step length;Step through  Stair Management: Stand by Assist;two rails;step to pattern;forward;with gait belt  Number of Stairs: 4  Distance Walked (ft) (Step 6,7): 260 Feet          Neuro Re-Ed  Standing Balance: standing reaching activities;standing weight shifting all planes;with support;without support;stand by assist;other (comment) (at RW)              Educated the Patient to role of physical therapy, plan of care, goals of therapy and safety with mobility and ADLs, discharge instructions, home safety with verbalized understanding .    Patient left in bed. Pt left with all appropriate medical equipment in place, call bell and pt personal items/needs within reach, and with alarm in place. RN notified of session outcome. CM team and attending have been previously notified of d/c recommendation; no  updates to d/c recommendation since that time.      Assessment:  Pt able to perform all activities c/ SBA, including ambulating 240' c/ RW and navigate 8 stairs c/ B rails. Discussed pt's progress c/ Dominique Mariner, PT, DPT. Discharging pt from acute care PT.                PMP Activity: Step 7 - Walks out of Room  Distance Walked (ft) (Step 6,7): 260 Feet    Plan:  Treatment/Interventions: Endurance training, LE strengthening/ROM, Functional transfer training, Neuromuscular re-education, Stair training, Gait training, Exercise      PT Frequency: therapy discontinued   Discharge from PT Acute Care Services.    Goals:  Goals  Goal Formulation: With patient  Time for Goal Acheivement: By time of discharge  Goals: Select goal  Pt Will  Go Supine To Sit: with stand by assist, to maximize functional mobility and independence, Goal met (SBA on 1/23)  Pt Will Perform Sit To Supine: with stand by assist, to maximize functional mobility and independence, Goal met (SBA on 1/23)  Pt Will Perform Sit to Stand: with supervision, to maximize functional mobility and independence, Partly met (SBA on 1/23)  Pt Will Transfer Bed/Chair: with rolling walker, with supervision, to maximize functional mobility and independence, Partly met (still req's CGA)  Pt Will Ambulate: 51-100 feet, with rolling walker, with supervision, to maximize functional mobility and independence, Partly met (260' SBA c/ RW on 1/23)  Pt Will Go Up / Down Stairs: 6-10 stairs, with supervision, With rail, to maximize functional mobility and independence, Partly met (4 stairs SBA c/ B/UE support on 1/23)        Chauncey Zachary Bolster, LPTA 2  [place] license # [id number]    [facility]  [facility]  [phone]    01/16/2024  5:44 PM      [facility]  Patient: Roy Delgado. MRN#: [id number]  Unit: [address] [facility] Bed: [phone]

## 2024-01-16 NOTE — Progress Notes (Signed)
 PROGRESS NOTE  [facility]    Date Time: 01/16/24 8:38 AM  Patient Name: Roy Delgado, Roy Delgado.      ASSESSMENT:   3 Days Post-Op S/P Procedure(s):  LAPAROSCOPY, DIAGNOSTIC, LYSIS OF ADHESIONS  LAPAROSCOPY, REDUCTION OF VOLVULUS, REPAIR INTERNAL HERNIA    Travus W Lavin Petteway. is a 88 y.o. male with internal hernia causing bowel obstruction (2/2 adhesive tissue), now s/p diagnostic laparoscopy with LOA and reduction of jejunal volvulus. Bowel movement and flatus yesterday/ overnight     PLAN:   - advance to reg diet  - saline lock IV   - multimodal pain control  - encourage ambulation  - patient to be out of bed to chair at least 1 time today    Addedum: I have personally seen and examined this patient on 01/16/2024. I agree with the above information, including the exam, history, and plan as documented.  I have made any necessary corrections to the note.    Maniyah Moller K. Tobie, MD  [facility]  Office: [phone]      SUBJECTIVE:   No acute overnight events. Doing well this morning.   Passing flatus and BM  Tolerating clear liq diet w/o N/V        OBJECTIVE:   Current Vitals:   Vitals:    01/16/24 0753   BP: 181/80   Pulse: 77   Resp: 16   Temp: 97.9 F (36.6 C)   SpO2: 99%       Intake and Output Summary (Last 24 hours):  I/O last 3 completed shifts:  In: 1120 [P.O.:1000; NG/GT:120]  Out: 2100 [Urine:1500; Emesis/NG output:600]    Labs:     Results       Procedure Component Value Units Date/Time    Whole Blood Glucose POCT [[id number]]  (Abnormal) Collected: 01/16/24 0746    Specimen: Blood, Capillary Updated: 01/16/24 0804     Whole Blood Glucose POCT 254 mg/dL     Basic Metabolic Panel [[id number]]  (Abnormal) Collected: 01/16/24 0401    Specimen: Blood, Venous Updated: 01/16/24 0438     Glucose 258 mg/dL      BUN 21 mg/dL      Creatinine 1.1 mg/dL      Calcium 8.7 mg/dL      Sodium 861 mEq/L      Potassium 3.9 mEq/L      Chloride 103 mEq/L      CO2 24 mEq/L      Anion Gap 11.0      GFR >60.0 mL/min/1.73 m2     CBC without Differential [[id number]]  (Abnormal) Collected: 01/16/24 0401    Specimen: Blood, Venous Updated: 01/16/24 0426     WBC 6.43 x10 3/uL      Hemoglobin 10.3 g/dL      Hematocrit 67.2 %      Platelet Count 241 x10 3/uL      MPV 10.9 fL      RBC 3.61 x10 6/uL      MCV 90.6 fL      MCH 28.5 pg      MCHC 31.5 g/dL      RDW 15 %      nRBC % 0.0 /100 WBC      Absolute nRBC 0.00 x10 3/uL     Culture, Blood, Aerobic And Anaerobic [[id number]] Collected: 01/13/24 1743    Specimen: Blood, Venous Updated: 01/15/24 2200     Culture Blood No growth at 2 days    Whole Blood Glucose  POCT [[id number]]  (Abnormal) Collected: 01/15/24 2057    Specimen: Blood, Capillary Updated: 01/15/24 2101     Whole Blood Glucose POCT 343 mg/dL     Culture, Urine [[id number]] Collected: 01/13/24 1633    Specimen: Urine, Clean Catch Updated: 01/15/24 1735    Narrative:      >100,000 CFU/mL of multiple bacterial morphotypes present.  Possible contamination, appropriate recollection is requested if clinically indicated.    Whole Blood Glucose POCT [[id number]]  (Abnormal) Collected: 01/15/24 1541    Specimen: Blood, Capillary Updated: 01/15/24 1604     Whole Blood Glucose POCT 150 mg/dL     Whole Blood Glucose POCT [[id number]]  (Abnormal) Collected: 01/15/24 1143    Specimen: Blood, Capillary Updated: 01/15/24 1147     Whole Blood Glucose POCT 235 mg/dL     Basic Metabolic Panel [[id number]]  (Abnormal) Collected: 01/15/24 0818    Specimen: Blood, Venous Updated: 01/15/24 0847     Glucose 225 mg/dL      BUN 28 mg/dL      Creatinine 1.3 mg/dL      Calcium 8.7 mg/dL      Sodium 854 mEq/L      Potassium 4.6 mEq/L      Chloride 105 mEq/L      CO2 28 mEq/L      Anion Gap 12.0     GFR 53.8 mL/min/1.73 m2             Rads:     Radiology Results (24 Hour)       ** No results found for the last 24 hours. **            Physical Exam:     Physical Exam  Constitutional:       General: He is not in acute distress.      Appearance: Normal appearance.   HENT:      Head: Normocephalic and atraumatic.   Cardiovascular:      Pulses: Normal pulses.   Pulmonary:      Effort: Pulmonary effort is normal.   Abdominal:      General: There is no distension.      Palpations: Abdomen is soft.      Tenderness: There is abdominal tenderness (appropriate). There is no guarding or rebound.      Comments: Laparoscopic incisions well approximated with surgical glue, c/d/i   Skin:     General: Skin is warm and dry.   Neurological:      Mental Status: He is alert.              Signed by:   Fairy FORBES Kil, MD

## 2024-01-16 NOTE — Plan of Care (Signed)
 NURSING SHIFT NOTE     Patient: Roy Delgado.  Day: 3      SHIFT EVENTS     Shift Narrative/Significant Events (PRN med administration, fall, RRT, etc.):     Patient A&O x 4. VSS on room air. Scheduled meds given, tolerated well.    Bed locked in lowest position. Call light within reach. Safety measures in place. Purposeful rounding completed.    Continue plan of care for IVF, IV ABX, pain management, IS, PT/OT consult, AC/HS AccuChecks.    Discharge TBD.       ASSESSMENT     Changes in assessment from patient's baseline this shift:    Neuro: No  CV: No  Pulm: No  Peripheral Vascular: No  HEENT: No  GI: No  BM during shift: No, Last BM Date: 01/15/24  GU: No   Integ: No  MS: No    Pain: No change  Pain Interventions: Rest    Mobility: PMP Activity: Step 3 - Bed Mobility of Distance Walked (ft) (Step 6,7): 30 Feet         Lines     Patient Lines/Drains/Airways Status       Active Lines, Drains and Airways       Name Placement date Placement time Site Days    Peripheral IV 01/13/24 20 G Standard Left Antecubital 01/13/24  1447  Antecubital  2                       VITAL SIGNS     Vitals:    01/15/24 2333   BP: 166/86   Pulse: 88   Resp:    Temp:    SpO2: 96%       Temp  Min: 97.3 F (36.3 C)  Max: 99 F (37.2 C)  Pulse  Min: 84  Max: 123  Resp  Min: 15  Max: 20  BP  Min: 128/71  Max: 178/89  SpO2  Min: 94 %  Max: 97 %      Intake/Output Summary (Last 24 hours) at 01/16/2024 0353  Last data filed at 01/15/2024 2333  Gross per 24 hour   Intake 560 ml   Output 1050 ml   Net -490 ml          CARE PLAN        Problem: Pain interferes with ability to perform ADL  Goal: Pain at adequate level as identified by patient  01/16/2024 0352 by Scarlett Morna Price, RN  Outcome: Progressing  Flowsheets (Taken 01/15/2024 1543 by Kassaye, Tilahun G, RN)  Pain at adequate level as identified by patient:  . Identify patient comfort function goal  . Assess for risk of opioid induced respiratory depression, including snoring/sleep  apnea. Alert healthcare team of risk factors identified.  . Assess pain on admission, during daily assessment and/or before any as needed intervention(s)  . Reassess pain within 30-60 minutes of any procedure/intervention, per Pain Assessment, Intervention, Reassessment (AIR) Cycle  . Evaluate if patient comfort function goal is met  . Evaluate patient's satisfaction with pain management progress  01/16/2024 0351 by Scarlett Morna Price, RN  Outcome: Progressing  Flowsheets (Taken 01/15/2024 1543 by Kassaye, Tilahun G, RN)  Pain at adequate level as identified by patient:  . Identify patient comfort function goal  . Assess for risk of opioid induced respiratory depression, including snoring/sleep apnea. Alert healthcare team of risk factors identified.  . Assess pain on admission, during daily assessment and/or before  any as needed intervention(s)  . Reassess pain within 30-60 minutes of any procedure/intervention, per Pain Assessment, Intervention, Reassessment (AIR) Cycle  . Evaluate if patient comfort function goal is met  . Evaluate patient's satisfaction with pain management progress     Problem: Side Effects from Pain Analgesia  Goal: Patient will experience minimal side effects of analgesic therapy  01/16/2024 0352 by Scarlett Morna Price, RN  Outcome: Progressing  Flowsheets (Taken 01/15/2024 1543 by Kassaye, Tilahun G, RN)  Patient will experience minimal side effects of analgesic therapy:  . Monitor/assess patient's respiratory status (RR depth, effort, breath sounds)  . Assess for changes in cognitive function  . Prevent/manage side effects per LIP orders (i.e. nausea, vomiting, pruritus, constipation, urinary retention, etc.)  . Evaluate for opioid-induced sedation with appropriate assessment tool (i.e. POSS)  01/16/2024 0351 by Scarlett Morna Price, RN  Outcome: Progressing  Flowsheets (Taken 01/15/2024 1543 by Kassaye, Tilahun G, RN)  Patient will experience minimal side effects of analgesic therapy:  .  Monitor/assess patient's respiratory status (RR depth, effort, breath sounds)  . Assess for changes in cognitive function  . Prevent/manage side effects per LIP orders (i.e. nausea, vomiting, pruritus, constipation, urinary retention, etc.)  . Evaluate for opioid-induced sedation with appropriate assessment tool (i.e. POSS)     Problem: Moderate/High Fall Risk Score >5  Goal: Patient will remain free of falls  01/16/2024 0352 by Scarlett Morna Price, RN  Outcome: Progressing  Flowsheets (Taken 01/15/2024 2200)  Moderate Risk (6-13):  SABRA MOD-Consider activation of bed alarm if appropriate  . MOD-Apply bed exit alarm if patient is confused  . MOD-Floor mat at bedside (where available) if appropriate  . MOD-Consider a move closer to [employer]  . MOD-Remain with patient during toileting  . MOD-Place bedside commode and assistive devices out of sight when not in use  . MOD-Re-orient confused patients  . MOD-Utilize diversion activities  . MOD-Perform dangle, stand, walk (DSW) prior to mobilization  . MOD-Request PT/OT consult order for patients with gait/mobility impairment  . MOD-Use gait belt when appropriate  . MOD-include family in multidisciplinary POC discussions  . MOD- Consider video monitoring  01/16/2024 0351 by Scarlett Morna Price, RN  Outcome: Progressing  Flowsheets (Taken 01/15/2024 2200)  Moderate Risk (6-13):  SABRA MOD-Consider activation of bed alarm if appropriate  . MOD-Apply bed exit alarm if patient is confused  . MOD-Floor mat at bedside (where available) if appropriate  . MOD-Consider a move closer to [employer]  . MOD-Remain with patient during toileting  . MOD-Place bedside commode and assistive devices out of sight when not in use  . MOD-Re-orient confused patients  . MOD-Utilize diversion activities  . MOD-Perform dangle, stand, walk (DSW) prior to mobilization  . MOD-Request PT/OT consult order for patients with gait/mobility impairment  . MOD-Use gait belt when appropriate  . MOD-include  family in multidisciplinary POC discussions  . MOD- Consider video monitoring     Problem: SCIP  Goal: SCIP measures are followed  01/16/2024 0352 by Scarlett Morna Price, RN  Outcome: Progressing  Flowsheets (Taken 01/14/2024 0422 by Jordis Eis, RN)  SCIP measures are followed:  SABRA Administer antibiotics as ordered  . VTE Prevention: Administer anticoagulant(s) and/or apply anti-embolism stockings/devices as ordered  . Provide VTE prophylaxis within 24 hours of anesthesia end time (Note: SCD to lower extremities)  01/16/2024 0351 by Scarlett Morna Price, RN  Outcome: Progressing  Flowsheets (Taken 01/14/2024 0422 by Jordis Eis, RN)  SCIP measures are followed:  .  Administer antibiotics as ordered  . VTE Prevention: Administer anticoagulant(s) and/or apply anti-embolism stockings/devices as ordered  . Provide VTE prophylaxis within 24 hours of anesthesia end time (Note: SCD to lower extremities)     Problem: Inadequate Airway Clearance  Goal: Patent Airway maintained  01/16/2024 0352 by Scarlett Morna Price, RN  Outcome: Progressing  Flowsheets (Taken 01/14/2024 0422 by Jordis Eis, RN)  Patent airway maintained:  . Position patient for maximum ventilatory efficiency  . Reinforce use of ordered respiratory interventions (i.e. CPAP, BiPAP, Incentive Spirometer, Acapella, etc.)  . Reposition patient every 2 hours and as needed unless able to self-reposition  . Provide adequate fluid intake to liquefy secretions  . Suction secretions as needed  01/16/2024 0351 by Scarlett Morna Price, RN  Outcome: Progressing  Flowsheets (Taken 01/14/2024 0422 by Jordis Eis, RN)  Patent airway maintained:  . Position patient for maximum ventilatory efficiency  . Reinforce use of ordered respiratory interventions (i.e. CPAP, BiPAP, Incentive Spirometer, Acapella, etc.)  . Reposition patient every 2 hours and as needed unless able to self-reposition  . Provide adequate fluid intake to liquefy secretions  . Suction secretions as  needed  Goal: Normal respiratory rate/effort achieved/maintained  01/16/2024 0352 by Scarlett Morna Price, RN  Outcome: Progressing  Flowsheets (Taken 01/14/2024 0422 by Jordis Eis, RN)  Normal respiratory rate/effort achieved/maintained: Plan activities to conserve energy: plan rest periods  01/16/2024 0351 by Scarlett Morna Price, RN  Outcome: Progressing  Flowsheets (Taken 01/14/2024 0422 by Jordis Eis, RN)  Normal respiratory rate/effort achieved/maintained: Plan activities to conserve energy: plan rest periods     Problem: Infection Prevention  Goal: Free from infection  01/16/2024 0352 by Scarlett Morna Price, RN  Outcome: Progressing  Flowsheets (Taken 01/15/2024 0358 by Carle Evener, RN)  Free from infection:  . Encourage/assist patient to turn, cough and perform deep breathing every 2 hours  . Assess incision for evidence of healing  . Monitor/assess output from surgical drain if present  . Assess for signs and symptoms of infection  . Monitor/assess lab values and report abnormal values  . Monitor/assess vital signs  01/16/2024 0351 by Scarlett Morna Price, RN  Outcome: Progressing  Flowsheets (Taken 01/15/2024 0358 by Carle Evener, RN)  Free from infection:  . Encourage/assist patient to turn, cough and perform deep breathing every 2 hours  . Assess incision for evidence of healing  . Monitor/assess output from surgical drain if present  . Assess for signs and symptoms of infection  . Monitor/assess lab values and report abnormal values  . Monitor/assess vital signs     Problem: Impaired Mobility  Goal: Mobility/Activity is maintained at optimal level for patient  01/16/2024 0352 by Scarlett Morna Price, RN  Outcome: Progressing  Flowsheets (Taken 01/14/2024 0422 by Jordis Eis, RN)  Mobility/activity is maintained at optimal level for patient:  . Encourage independent activity per ability  . Consult/collaborate with Physical Therapy and/or Occupational Therapy  01/16/2024 0351 by Scarlett Morna Price,  RN  Outcome: Progressing  Flowsheets (Taken 01/14/2024 0422 by Jordis Eis, RN)  Mobility/activity is maintained at optimal level for patient:  . Encourage independent activity per ability  . Consult/collaborate with Physical Therapy and/or Occupational Therapy     Problem: Compromised Moisture  Goal: Moisture level Interventions  01/16/2024 0352 by Scarlett Morna Price, RN  Outcome: Progressing  01/16/2024 0351 by Scarlett Morna Price, RN  Outcome: Progressing     Problem: Compromised Activity/Mobility  Goal: Activity/Mobility Interventions  01/16/2024 0352 by Scarlett Morna Price, RN  Outcome: Progressing  Flowsheets (Taken 01/15/2024 0000 by Carle Evener, RN)  Activity/Mobility Interventions: Pad bony prominences, TAP Seated positioning system when OOB, Promote PMP, Reposition q 2 hrs / turn clock, Offload heels  01/16/2024 0351 by Scarlett Morna Price, RN  Outcome: Progressing  Flowsheets (Taken 01/15/2024 0000 by Carle Evener, RN)  Activity/Mobility Interventions: Pad bony prominences, TAP Seated positioning system when OOB, Promote PMP, Reposition q 2 hrs / turn clock, Offload heels     Problem: Compromised Sensory Perception  Goal: Sensory Perception Interventions  01/16/2024 0352 by Scarlett Morna Price, RN  Outcome: Progressing  Flowsheets (Taken 01/15/2024 0000 by Carle Evener, RN)  Sensory Perception Interventions: Offload heels, Pad bony prominences, Reposition q 2hrs/turn Clock, Q2 hour skin assessment under devices if present  01/16/2024 0351 by Scarlett Morna Price, RN  Outcome: Progressing     Problem: Compromised Nutrition  Goal: Nutrition Interventions  01/16/2024 0352 by Scarlett Morna Price, RN  Outcome: Progressing  Flowsheets (Taken 01/16/2024 [phone])  Nutrition Interventions: Discuss nutrition at Rounds, I&Os, Document % meal eaten, Daily weights  01/16/2024 0351 by Scarlett Morna Price, RN  Outcome: Progressing  Flowsheets (Taken 01/15/2024 2000)  Nutrition Interventions: Discuss nutrition at Rounds,  I&Os, Document % meal eaten, Daily weights     Problem: Compromised Friction/Shear  Goal: Friction and Shear Interventions  01/16/2024 0352 by Scarlett Morna Price, RN  Outcome: Progressing  01/16/2024 0351 by Scarlett Morna Price, RN  Outcome: Progressing     Problem: Altered GI Function  Goal: Fluid and electrolyte balance are achieved/maintained  01/16/2024 0352 by Scarlett Morna Price, RN  Outcome: Progressing  Flowsheets (Taken 01/15/2024 0358 by Carle Evener, RN)  Fluid and electrolyte balance are achieved/maintained:  . Monitor/assess lab values and report abnormal values  . Assess and reassess fluid and electrolyte status  . Observe for cardiac arrhythmias  . Monitor for muscle weakness  01/16/2024 0351 by Scarlett Morna Price, RN  Outcome: Progressing  Flowsheets (Taken 01/15/2024 0358 by Carle Evener, RN)  Fluid and electrolyte balance are achieved/maintained:  . Monitor/assess lab values and report abnormal values  . Assess and reassess fluid and electrolyte status  . Observe for cardiac arrhythmias  . Monitor for muscle weakness     Problem: Diabetes: Glucose Imbalance  Goal: Blood glucose stable at established goal  01/16/2024 0352 by Scarlett Morna Price, RN  Outcome: Progressing  Flowsheets (Taken 01/15/2024 0358 by Carle Evener, RN)  Blood glucose stable at established goal:  . Monitor lab values  . Monitor intake and output.  Notify LIP if urine output is < 30 mL/hour.  . Follow fluid restrictions/IV/PO parameters  . Include patient/family in decisions related to nutrition/dietary selections  . Assess for hypoglycemia /hyperglycemia  . Monitor/assess vital signs  . Coordinate medication administration with meals, as indicated  . Ensure adequate hydration  . Ensure patient/family has adequate teaching materials  . Ensure appropriate consults are obtained (Nutrition, Diabetes Education, and Case Management)  . Ensure appropriate diet and assess tolerance  01/16/2024 0351 by Scarlett Morna Price,  RN  Outcome: Progressing  Flowsheets (Taken 01/15/2024 0358 by Carle Evener, RN)  Blood glucose stable at established goal:  . Monitor lab values  . Monitor intake and output.  Notify LIP if urine output is < 30 mL/hour.  . Follow fluid restrictions/IV/PO parameters  . Include patient/family in decisions related to nutrition/dietary selections  . Assess for hypoglycemia /hyperglycemia  . Monitor/assess vital signs  . Coordinate medication administration with meals, as indicated  .  Ensure adequate hydration  . Ensure patient/family has adequate teaching materials  . Ensure appropriate consults are obtained (Nutrition, Diabetes Education, and Case Management)  . Ensure appropriate diet and assess tolerance     Problem: Everyday - Heart Failure  Goal: Stable Vital Signs and Fluid Balance  Outcome: Progressing  Flowsheets (Taken 01/16/2024 0352)  Stable Vital Signs and Fluid Balance:  . Daily Standing Weights in the morning using the same scale, after using the bathroom and before breadfast.  If unable to stand, zero the bed and use the bed scale  . Monitor, assess vital signs and telemetry per policy  . Monitor labs and report abnormalities to physician  . Strict Intake/Output  . Fluid Restriction  . Assess for swelling/edema  . Wean oxygen  as needed if appropriate  Goal: Mobility/Activity is Maintained at Optimal Level for Patient  Outcome: Progressing  Flowsheets (Taken 01/16/2024 0352)  Mobility/Activity is Maintained at Optimal Level for Patient:  . Increase mobility as tolerated/progressive mobility protocol  . Maintain SCD's as Ordered  . Perform active/passive ROM  . Reposition patient every 2 hours and as needed unless able to reposition self  . Assess for changes in respiratory status, level of consciousness and/or development of fatigue  . Consult with Physical Therapy and/or Occupational Therapy  Goal: Nutritional Intake is Adequate  Outcome: Progressing  Flowsheets (Taken 01/16/2024 0352)  Nutritional Intake  is Adequate:  . Cardiac diet-2 gm Sodium  . Consult/Collaborate with Nutritionist  . Patient and family teaching on low sodium diet  . Assess appetite,anorexia and amount of meal/food tolerated  . Encourage/perform oral hygiene as appropriate  . Fluid Restricction if needed  Goal: Teaching-Using CHF Warning Zones and Educational Videos  Outcome: Progressing  Flowsheets (Taken 01/16/2024 0352)  Teaching-Using CHF Warning Zones and Educational Videos:  . Signs & Symptoms of CHF  . Daily Standing Weights & record  . CHF Warning Zones and when to call for help  . Medications  . Sodium Restriction  . Fluid Restriction if appropriate  . Document in the Education Tab in EPIC with Teach-back

## 2024-01-16 NOTE — Discharge Instr - AVS First Page (Addendum)
 Admit Date: 01/13/2024   Discharge Date: 01/16/2024     Attending: Coni Imagene LABOR, MD   Surgeon: Clotilde):  Will, Dallas BROCKS, DO      Procedure(s) (LRB):  LAPAROSCOPY, DIAGNOSTIC, LYSIS OF ADHESIONS (N/A)  LAPAROSCOPY, REDUCTION OF VOLVULUS, REPAIR INTERNAL HERNIA    DISCHARGE INSTRUCTIONS--GENERAL SURGERY    Patient Instructions after your general surgery.  Please refer to the complete postop instructions for details.    WHAT TO EXPECT AT YOUR SURGICAL SITE(S):    Pink/reddish drainage, bruising, swelling/lumps at incision(s) may occur and is normal.    CARE FOR THE INCISION(S):    Skin glue over your incision(s) will dissolve over the next two weeks.  If you have white tapes on the skin (steri-strips), these will fall off over 7-10 days  If you have staples in place, they will be removed 7-10 days after your surgery.  Shower 24 hours after your surgery.  No tub bathing, or pool/ocean for 1 week.  Use an ice pack every 15-20 min intermittently for the first 48 hrs as needed.  If you have a surgical drain, refer to the attached drain care instructions or to our website.    DIET:    Keep up with your liquids.  If your appetite has returned, eat what your system can tolerate.     ACTIVITY:    If it hurts, don't do it!  Walking & stairs are encouraged.    Avoid strenuous physical activity & lifting until cleared by your surgeon.  You may drive again once you have stopped taking prescription medication for pain.      MEDICATION:    Resume all home medications.  Use over-the-counter pain medications or a prescribed narcotic for your discomfort as needed.    For constipation, an over-the-counter stool softener or laxative may be taken.  Please refer to attached detailed instructions or our website for this issue.    WHAT TO LOOK FOR:    Please call our office immediately at [phone] or go to the ER if you develop any of the following:    Excessive drainage, bleeding, redness or swelling at or around the  incision(s)  Fever over 101F  Increased incisional pain  Persistent nausea or vomiting  Difficulty with urination  Difficulty breathing, chest pain or calf pain    FOLLOW-UP:    We will see you in our office 1-2 weeks after your surgery.  If not already scheduled, please call [phone] to arrange your post op visit.       Home Health Discharge Information    Your doctor has ordered Skilled Nursing, Physical Therapy, and Occupational Therapy in-home service(s) for you while you recuperate at home, to assist you in the transition from hospital to home.    The agency that you or your representative chose to provide the service:  Name of Home Health Agency Placement: [facility]]  Phone: [phone]     [employer]:  [employer] ]  Equipment Ordered: Hospital Bed        The above services were set up by:  Murleen Curtistine Meissner, RN (Home Health Liaison)   Phone: [phone]        IF YOU HAVE NOT HEARD FROM YOUR HOME HEALTH AGENCY WITHIN 24-48 HOURS AFTER DISCHARGE PLEASE CALL YOUR AGENCY TO ARRANGE A TIME FOR YOUR FIRST VISIT. FOR ANY SCHEDULING CONCERNS OR QUESTIONS RELATED TO HOME HEALTH, SUCH AS TIME OR DATE PLEASE CONTACT YOUR HOME HEALTH AGENCY  AT THE NUMBER LISTED ABOVE.

## 2024-01-16 NOTE — Progress Notes (Signed)
 Call from Pt.'s daughter  requesting  a hospital  bed  stated that Pt been sleeping on a sofa.  Secure chat message sent to OT  re: updating DME recommendation.  CM to follow.  Deliah Boston BSN RN  RN Case Manager  [facility]  [phone]

## 2024-01-16 NOTE — Progress Notes (Signed)
 Initial Case Management Assessment and Discharge Planning [facility]   Patient Name: Roy Delgado, Roy Delgado.   Date of Birth [birth year]   Attending Physician: Coni Imagene LABOR, MD   Primary Care Physician: Celestina Lucie PARAS, MD   Length of Stay 3   Reason for Consult / Chief Complaint IDPA  EMESIS; ABDOMINAL PAIN        Situation   Admission DX:   1. Intestinal volvulus    2. Acute UTI        A/O Status: X 3    LACE Score: 11    Patient admitted from: ER  Admission Status: inpatient    Health Care Agent: Self  Name: GRFFIN, NICOLE, DAUGHTER  Phone number: [phone]       Background     Advanced directive:   <no information>    has NO advance directive - additional information requested    Code Status:   Full Code     Residence: Multi-story home    PCP: Lucie PARAS Celestina, MD  Patient Contact:   [phone] (home)     [phone] (mobile)     Emergency contact:   Extended Emergency Contact Information  Primary Emergency Contact: Cardy,Nicole   [place]  Home Phone: [phone]  Mobile Phone: [phone]  Relation: Daughter  Secondary Emergency Contact: Signa Cary  Address: [address]           [place], [place] [address] [place]  Home Phone: [phone]  Relation: Spouse      ADL/IADL's: Independent, Assistive Device, and Needs Assist  Previous Level of function: 6 Modified Independent     DME: Rollator (4 wheeled walker)    Pharmacy:     [id number] [place],  - [address] AT [facility] [address]  [address] [place]  [place] [address]  Phone: [phone] Fax: [phone]      Prescription Coverage: Yes    Home Health: The patient is receiving home health services, including a home health aide.    Previous SNF/AR: NO    COVID Vaccine Status: UNKNOWN    Date First IMM given: 1/20  UAI on file?: No  Transport for discharge? Mode of transportation: [employer]/Van  Agreeable to Home with family post-discharge:  Yes     Assessment    CM met with Pt. and  care giver/ spoke with daughter via speaker phone # [phone] re: DCP Pt. Lives with daughter , on 2nd level home with 15 steps .Pt. has private pay Aide to assist with IADLS.  Pt recs. Home with HHPT/HHOT Pt. agreeable ; referral sent to [facility]. . Will need [facility] transport  BARRIERS TO DISCHARGE: medical stability     Recommendation   D/C Plan A: Home with family    D/C Plan B: Home with home health          Marko Canner BSN RN  RN Case Manager  [facility]  [phone]

## 2024-01-16 NOTE — Plan of Care (Signed)
 NURSING SHIFT NOTE     Patient: Roy Delgado.  Day: 3      SHIFT EVENTS     Shift Narrative/Significant Events (PRN med administration, fall, RRT, etc.):     Patient A&O x4, tolerating regular diet, voiding. Denies pain, chest tightness, SOB, N/V. Patient ambulated in room and sat up in chair during shift. Patient had a BM.    Safety and fall precautions remain in place. Purposeful rounding completed.          ASSESSMENT     Changes in assessment from patient's baseline this shift:    Neuro: No  CV: No  Pulm: No  Peripheral Vascular: No  HEENT: No  GI: No  BM during shift: No, Last BM: Last BM date: 01/15/23  GU: No   Integ: No  MS: No    Pain: Improved  Pain Interventions: Medications, Rest, and Positioning  Medications Utilized: No PRN meds given    Mobility: PMP Activity: Step 5 - Chair, Step 6 - Walks in Room of Distance Walked (ft) (Step 6,7): 20 Feet           Lines     Patient Lines/Drains/Airways Status       Active Lines, Drains and Airways       Name Placement date Placement time Site Days    Peripheral IV 01/13/24 20 G Standard Left Antecubital 01/13/24  1447  Antecubital  2                         VITAL SIGNS     Vitals:    01/16/24 1310   BP: 174/80   Pulse:    Resp:    Temp:    SpO2:        Temp  Min: 97.3 F (36.3 C)  Max: 99 F (37.2 C)  Pulse  Min: 77  Max: 123  Resp  Min: 16  Max: 20  BP  Min: 133/77  Max: 191/92  SpO2  Min: 94 %  Max: 99 %      Intake/Output Summary (Last 24 hours) at 01/16/2024 1352  Last data filed at 01/16/2024 1312  Gross per 24 hour   Intake 1000 ml   Output 1980 ml   Net -980 ml                CARE PLAN        Problem: Pain interferes with ability to perform ADL  Goal: Pain at adequate level as identified by patient  Outcome: Progressing  Flowsheets (Taken 01/15/2024 1543 by Kassaye, Tilahun G, RN)  Pain at adequate level as identified by patient:  . Identify patient comfort function goal  . Assess for risk of opioid induced respiratory depression, including  snoring/sleep apnea. Alert healthcare team of risk factors identified.  . Assess pain on admission, during daily assessment and/or before any as needed intervention(s)  . Reassess pain within 30-60 minutes of any procedure/intervention, per Pain Assessment, Intervention, Reassessment (AIR) Cycle  . Evaluate if patient comfort function goal is met  . Evaluate patient's satisfaction with pain management progress     Problem: Moderate/High Fall Risk Score >5  Goal: Patient will remain free of falls  Outcome: Progressing  Flowsheets  Taken 01/16/2024 1000 by Cena Rank, Dagmar Goodrich, RN  Moderate Risk (6-13):  SABRA MOD-Perform dangle, stand, walk (DSW) prior to mobilization  . MOD-Remain with patient during toileting  Taken 01/15/2024 0000 by Carle Evener,  RN  High (Greater than 13):  SABRA HIGH-Visual cue at entrance to patient's room  . HIGH-Bed alarm on at all times while patient in bed  . HIGH-Utilize chair pad alarm for patient while in the chair  . HIGH-Apply yellow Fall Risk arm band  . HIGH-Pharmacy to initiate evaluation and intervention per protocol  . HIGH-Initiate use of floor mats as appropriate  . HIGH-Consider use of low bed  Taken 01/14/2024 0422 by Jordis Eis, RN  VH High Risk (Greater than 13):  SABRA ALL REQUIRED LOW INTERVENTIONS  . ALL REQUIRED MODERATE INTERVENTIONS  . RED HIGH FALL RISK SIGNAGE  . BED ALARM WILL BE ACTIVATED WHEN THE PATEINT IS IN BED WITH SIGNAGE RESET BED ALARM  . A CHAIR PAD ALARM WILL BE USED WHEN PATIENT IS UP SITTING IN A CHAIR  . PATIENT IS TO BE SUPERVISED FOR ALL TOILETING ACTIVITIES  . A safety companion may be used when deemed appropriate by the Primary RN and Health Visitor  . Keep door open for better visibility  . Include family/significant other in multidisciplinary discussion regarding plan of care as appropriate  . Request PT/OT therapy consult order from physician for patients with gait/mobility impairment  . Use assistive devices     Problem:  Altered GI Function  Goal: Fluid and electrolyte balance are achieved/maintained  Outcome: Progressing  Flowsheets (Taken 01/15/2024 0358 by Carle Evener, RN)  Fluid and electrolyte balance are achieved/maintained:  . Monitor/assess lab values and report abnormal values  . Assess and reassess fluid and electrolyte status  . Observe for cardiac arrhythmias  . Monitor for muscle weakness     Problem: Diabetes: Glucose Imbalance  Goal: Blood glucose stable at established goal  Outcome: Progressing  Flowsheets (Taken 01/15/2024 0358 by Carle Evener, RN)  Blood glucose stable at established goal:  . Monitor lab values  . Monitor intake and output.  Notify LIP if urine output is < 30 mL/hour.  . Follow fluid restrictions/IV/PO parameters  . Include patient/family in decisions related to nutrition/dietary selections  . Assess for hypoglycemia /hyperglycemia  . Monitor/assess vital signs  . Coordinate medication administration with meals, as indicated  . Ensure adequate hydration  . Ensure patient/family has adequate teaching materials  . Ensure appropriate consults are obtained (Nutrition, Diabetes Education, and Case Management)  . Ensure appropriate diet and assess tolerance     Problem: Everyday - Heart Failure  Goal: Stable Vital Signs and Fluid Balance  Outcome: Progressing  Flowsheets (Taken 01/16/2024 0352 by Scarlett Morna Price, RN)  Stable Vital Signs and Fluid Balance:  . Daily Standing Weights in the morning using the same scale, after using the bathroom and before breadfast.  If unable to stand, zero the bed and use the bed scale  . Monitor, assess vital signs and telemetry per policy  . Monitor labs and report abnormalities to physician  . Strict Intake/Output  . Fluid Restriction  . Assess for swelling/edema  . Wean oxygen  as needed if appropriate  Goal: Mobility/Activity is Maintained at Optimal Level for Patient  Outcome: Progressing  Flowsheets (Taken 01/16/2024 0352 by Scarlett Morna Price,  RN)  Mobility/Activity is Maintained at Optimal Level for Patient:  . Increase mobility as tolerated/progressive mobility protocol  . Maintain SCD's as Ordered  . Perform active/passive ROM  . Reposition patient every 2 hours and as needed unless able to reposition self  . Assess for changes in respiratory status, level of consciousness and/or development of fatigue  . Consult with Physical  Therapy and/or Occupational Therapy  Goal: Nutritional Intake is Adequate  Outcome: Progressing  Flowsheets (Taken 01/16/2024 0352 by Scarlett Morna Price, RN)  Nutritional Intake is Adequate:  . Cardiac diet-2 gm Sodium  . Consult/Collaborate with Nutritionist  . Patient and family teaching on low sodium diet  . Assess appetite,anorexia and amount of meal/food tolerated  . Encourage/perform oral hygiene as appropriate  . Fluid Restricction if needed

## 2024-01-17 LAB — CBC
Absolute nRBC: 0 10*3/uL (ref <=0.00)
Hematocrit: 31.4 % — ABNORMAL LOW (ref 37.6–49.6)
Hemoglobin: 10 g/dL — ABNORMAL LOW (ref 12.5–17.1)
MCH: 28.7 pg (ref 25.1–33.5)
MCHC: 31.8 g/dL (ref 31.5–35.8)
MCV: 90 fL (ref 78.0–96.0)
MPV: 11.5 fL (ref 8.9–12.5)
Platelet Count: 241 10*3/uL (ref 142–346)
RBC: 3.49 10*6/uL — ABNORMAL LOW (ref 4.20–5.90)
RDW: 15 % (ref 11–15)
WBC: 5.05 10*3/uL (ref 3.10–9.50)
nRBC %: 0 /100{WBCs} (ref <=0.0)

## 2024-01-17 LAB — BASIC METABOLIC PANEL
Anion Gap: 9 (ref 5.0–15.0)
BUN: 15 mg/dL (ref 9–28)
CO2: 25 meq/L (ref 17–29)
Calcium: 8.8 mg/dL (ref 7.9–10.2)
Chloride: 101 meq/L (ref 99–111)
Creatinine: 1 mg/dL (ref 0.5–1.5)
GFR: >60 mL/min/{1.73_m2} (ref >=60.0)
Glucose: 294 mg/dL — ABNORMAL HIGH (ref 70–100)
Potassium: 3.8 meq/L (ref 3.5–5.3)
Sodium: 135 meq/L (ref 135–145)

## 2024-01-17 LAB — WHOLE BLOOD GLUCOSE POCT
Whole Blood Glucose POCT: 183 mg/dL — ABNORMAL HIGH (ref 70–100)
Whole Blood Glucose POCT: 189 mg/dL — ABNORMAL HIGH (ref 70–100)
Whole Blood Glucose POCT: 249 mg/dL — ABNORMAL HIGH (ref 70–100)
Whole Blood Glucose POCT: 280 mg/dL — ABNORMAL HIGH (ref 70–100)
Whole Blood Glucose POCT: 300 mg/dL — ABNORMAL HIGH (ref 70–100)

## 2024-01-17 NOTE — Progress Notes (Signed)
 01/17/24 1310   Discharge Disposition   Patient preference/choice provided? Yes   Physical Discharge Disposition Home, Home Health   Name of DME Agency [facility]   Mode of Transportation Ambulance   Patient/Family/POA notified of transfer plan Yes   Patient agreeable to discharge plan/expected d/c date? Yes   Family/POA agreeable to discharge plan/expected d/c date? Yes   Bedside nurse notified of transport plan? Yes   CM Interventions   Multidisciplinary rounds/family meeting before d/c? Yes   Medicare Checklist   Is this a Medicare patient? No     Pt. agreeable for [place] home with [facility]. [facility] by [facility].  Transportation set thru [facility], time request 2 PM.  Daughter Nat updated, daughter is home to receive Pt.  Marko Canner BSN RN  RN Case Manager  [facility]  [phone]

## 2024-01-17 NOTE — Discharge Summary (Signed)
 [facility]      Patient: Roy Delgado.  Admission Date: 01/13/2024   DOB: 1936-10-29  Discharge Date: 01/17/2024    MRN: [id number]  Discharge Attending:Kariyah Baugh Marylen Bruins, MD     Referring Physician: Celestina Lucie PARAS, MD  PCP: Celestina Lucie PARAS, MD       DISCHARGE SUMMARY     Discharge Information   Admission Diagnosis:   Volvulus    Discharge Diagnosis:   Active Hospital Problems    Diagnosis   . Cystitis   . Volvulus   . Type II or unspecified type diabetes mellitus with renal manifestations, not stated as uncontrolled(250.40)   . Benign essential hypertension        Admission Condition: stable   Discharge Condition: stable   Consultants: surgery   Functional Status: Ambulate with assistive device  Discharged to: home with Home services     Hospital Course   Presentation History     A 88 y.o. 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       See HPI for details.    Hospital Course (4 Days)      1.  Small bowel obstruction secondary to adhesion/internal hernia/volvulus    -Patient's status post diagnostic laparoscopy with findings of internal hernia and volvulus of the discharge s/p reduction and lysis of  -Postoperative hospital course was uneventful  -Patient currently tolerating diet and having a bowel movement  -Cleared from surgical services for discharge with outpatient    2.  Underlying comorbidity include hypertension type 2 diabetes with hyperglycemia currently uncontrolled history of prostate    -Resume home medication and outpatient follow-up     3.  Abnormality UA  -urine culture remains negative.    -No reported urinary symptoms  -Completed 5 days course of IV ceftriaxone     4.  Chronic abnormal findings on CT abdomen pelvis  including Pulmonary  fibrosis and bronchiectasis in the lung bases.    -Outpatient follow-up      Hospital Bed:     Junette LELON Teddie Signa requires frequent changes in body position due to__recent abdominal surgery and deconditioning  Junette LELON Teddie Signa requires positioning of the bed in a way not feasible with an ordinary bed in order to alleviate pain from recent abdominal surgery .    Procedures/Imaging:   LAPAROSCOPY, DIAGNOSTIC, LYSIS OF ADHESIONS: 49320 (CPT)  LAPAROSCOPY, REDUCTION OF VOLVULUS, REPAIR INTERNAL HERNIA: 55761 (CPT) 01/13/2024     XR Chest AP Portable    Result Date: 01/13/2024  Nasogastric tube terminates in the proximal stomach Alphonza JONETTA Cohn, MD 01/13/2024 5:39 PM    CT Abd/Pelvis with IV Contrast    Result Date: 01/13/2024  1. Dilated fluid-filled stomach and duodenum with obstruction in the proximal jejunum where there is slight swirling at the root of the mesentery suspicious for midgut volvulus. 2. Nonobstructive left renal calculi and small renal cysts. COMMENT: This urgent result was discussed with and acknowledged by Dr. Von, at 1612 hours on 01/13/2024. Levada Cinnamon, MD 01/13/2024 4:15 PM           Progress Note/Physical Exam at Discharge     Subjective:Patient is stable for discharge.    Vitals:    01/17/24 0020 01/17/24 0405 01/17/24 0740 01/17/24 1000   BP: 158/71  156/90 168/81    Pulse: 70 (!) 103 67    Resp:  16 16    Temp:  98.1 F (36.7 C) 97.3 F (36.3 C)    TempSrc:  Oral Oral    SpO2: 96% 98% 98%    Weight:  90.3 kg (199 lb 1.2 oz)  90.3 kg (199 lb 1.2 oz)   Height:           O/e     General:   AAOx3  HEENT: Anicteric   neck: Supple,   Cardiovascular: S1-S2 normal rate  Lungs: Normal respiratory effort abdomen: Soft, +BS, laparoscopic site intact including extremities: No equal skin: No rashes or lesions noted  Neuro: alert and respond appropriately     Diagnostics     Labs/Studies Pending at Discharge: No    Last Labs   Recent Labs   Lab 01/17/24  0412 01/16/24  0401  01/15/24  0818   WBC 5.05 6.43 6.02   RBC 3.49* 3.61* 3.59*   Hemoglobin 10.0* 10.3* 10.6*   Hematocrit 31.4* 32.7* 32.7*   MCV 90.0 90.6 91.1   Platelet Count 241 241 254       Recent Labs   Lab 01/17/24  0412 01/16/24  0401 01/15/24  0818 01/14/24  1038 01/14/24  0404   Sodium 135 138 145 140 140   Potassium 3.8 3.9 4.6 4.6 5.1   Chloride 101 103 105 105 103   CO2 25 24 28 23 19    BUN 15 21 28  36* 34*   Creatinine 1.0 1.1 1.3 1.5 1.3   Glucose 294* 258* 225* 322* 433*   Calcium 8.8 8.7 8.7 8.6 8.8   Magnesium   --   --   --   --  1.9       Microbiology Results (last 15 days)       Procedure Component Value Units Date/Time    Culture, Blood, Aerobic And Anaerobic [[id number]] Collected: 01/13/24 1743    Order Status: Completed Specimen: Blood, Venous Updated: 01/16/24 2200     Culture Blood No growth at 3 days    Culture, Urine [[id number]] Collected: 01/13/24 1633    Order Status: Completed Specimen: Urine, Clean Catch Updated: 01/15/24 1735    Narrative:      >100,000 CFU/mL of multiple bacterial morphotypes present.  Possible contamination, appropriate recollection is requested if clinically indicated.    COVID-19 (SARS-CoV-2) and Influenza A/B, NAA (Liat)- Admission [[id number]]  (Normal) Collected: 01/13/24 1449    Order Status: Completed Specimen: Swab from Anterior Nares Updated: 01/13/24 1525     SARS-CoV-2 (COVID-19) RNA Not Detected     Influenza A RNA Not Detected     Influenza B RNA Not Detected    Narrative:      A result of Detected indicates POSITIVE for the presence of viral RNA  A result of Not Detected indicates NEGATIVE for the presence of viral RNA    Test performed using the Roche cobas Liat SARS-CoV-2 & Influenza A/B assay. This is a multiplex real-time RT-PCR assay for the detection of SARS-CoV-2, influenza A, and influenza B virus RNA. Viral nucleic acids may persist in vivo, independent of viability. Detection of viral nucleic acid does not imply the presence of infectious virus, or that  virus nucleic acid is the cause of clinical symptoms. Negative results do not preclude SARS-CoV-2, influenza A, and/or influenza B infection and should not be used as the sole basis for diagnosis, treatment or other patient management decisions. Invalid  results may be due to inhibiting substances in the specimen and recollection should occur.              Patient Instructions   Discharge Diet: Carb controlled heart healthy  Discharge Activity: as tolerated     Follow Up Appointment:   Follow-up Information       Will, Edward C, DO. Schedule an appointment as soon as possible for a visit in 2 week(s).    Specialty: Surgery  Why: Schedule postop appt for 1-2 weeks after surgery  Contact information:  [address]  [place] [address]  [phone]               Celestina Lucie PARAS, MD. Schedule an appointment as soon as possible for a visit in 1 week(s).    Specialty: Family Medicine  Contact information:  [address] [place] [address]  [phone]               [facility] Follow [facility].    Specialty: Home Health Services  Contact information:  [address], [address]  [place]  [address]  [phone]                            Discharge Medications:     Medication List        ASK your doctor about these medications      colchicine  0.6 MG tablet  Take 1 tab by mouth twice daily for 5 days then reduce to 1 tab daily. F/u with PCP     COMBIGAN OP     HUMALOG  SC     hydroCHLOROthiazide  25 MG tablet  Commonly known as: HYDRODIURIL      insulin  glargine 100 UNIT/ML injection  Commonly known as: LANTUS      levocetirizine 2.5 MG/5ML solution  Commonly known as: XYZAL     losartan  25 MG tablet  Commonly known as: COZAAR      metoprolol  succinate XL 25 MG 24 hr tablet  Commonly known as: TOPROL -XL     montelukast  10 MG tablet  Commonly known as: SINGULAIR      olmesartan 40 MG tablet  Commonly known as: BENICAR     pregabalin  100 MG capsule  Commonly known as: LYRICA                Time  spent examining patient, discussing with patient/family regarding hospital course, chart review, reconciling medications and discharge planning: 35  minutes.    Signed,  Jayson Marylen Bruins, MD    10:56 AM 01/17/2024

## 2024-01-17 NOTE — Progress Notes (Signed)
 Report given to Karena Addison RN at [phone].

## 2024-01-17 NOTE — Progress Notes (Signed)
 PROGRESS NOTE  [facility]    Date Time: 01/17/24 10:38 AM  Patient Name: Roy Delgado, Roy Delgado.      ASSESSMENT:   4 Days Post-Op S/P Procedure(s):  LAPAROSCOPY, DIAGNOSTIC, LYSIS OF ADHESIONS  LAPAROSCOPY, REDUCTION OF VOLVULUS, REPAIR INTERNAL HERNIA    Roy W Ahman Dugdale. is a 88 y.o. male with internal hernia causing bowel obstruction (2/2 adhesive tissue), now s/p diagnostic laparoscopy with LOA and reduction of jejunal volvulus. Having bowel function and tolerating diet.    PLAN:   - appropriate for discharge from surgical standpoint    SUBJECTIVE:   Feeling well, tolerating a regular diet  Stayed overnight to allow for arrangement of CM needs     OBJECTIVE:   Current Vitals:   Vitals:    01/17/24 0740   BP: 168/81   Pulse: 67   Resp: 16   Temp: 97.3 F (36.3 C)   SpO2: 98%       Intake and Output Summary (Last 24 hours):  I/O last 3 completed shifts:  In: 1400 [P.O.:1400]  Out: 3480 [Urine:3480]    Labs:     Results       Procedure Component Value Units Date/Time    Whole Blood Glucose POCT [[id number]]  (Abnormal) Collected: 01/17/24 0738    Specimen: Blood, Capillary Updated: 01/17/24 0748     Whole Blood Glucose POCT 183 mg/dL     Basic Metabolic Panel [[id number]]  (Abnormal) Collected: 01/17/24 0412    Specimen: Blood, Venous Updated: 01/17/24 0542     Glucose 294 mg/dL      BUN 15 mg/dL      Creatinine 1.0 mg/dL      Calcium 8.8 mg/dL      Sodium 864 mEq/L      Potassium 3.8 mEq/L      Chloride 101 mEq/L      CO2 25 mEq/L      Anion Gap 9.0     GFR >60.0 mL/min/1.73 m2     CBC without Differential [[id number]]  (Abnormal) Collected: 01/17/24 0412    Specimen: Blood, Venous Updated: 01/17/24 0519     WBC 5.05 x10 3/uL      Hemoglobin 10.0 g/dL      Hematocrit 68.5 %      Platelet Count 241 x10 3/uL      MPV 11.5 fL      RBC 3.49 x10 6/uL      MCV 90.0 fL      MCH 28.7 pg      MCHC 31.8 g/dL      RDW 15 %      nRBC % 0.0 /100 WBC      Absolute nRBC 0.00 x10 3/uL     Whole Blood Glucose  POCT [[id number]]  (Abnormal) Collected: 01/17/24 0412    Specimen: Blood, Capillary Updated: 01/17/24 0414     Whole Blood Glucose POCT 300 mg/dL     Whole Blood Glucose POCT [[id number]]  (Abnormal) Collected: 01/16/24 2358    Specimen: Blood, Capillary Updated: 01/17/24 0002     Whole Blood Glucose POCT 280 mg/dL     Culture, Blood, Aerobic And Anaerobic [[id number]] Collected: 01/13/24 1743    Specimen: Blood, Venous Updated: 01/16/24 2200     Culture Blood No growth at 3 days    Whole Blood Glucose POCT [[id number]]  (Abnormal) Collected: 01/16/24 2040    Specimen: Blood, Capillary Updated: 01/16/24 2041     Whole Blood Glucose POCT 330 mg/dL  Whole Blood Glucose POCT [[id number]]  (Abnormal) Collected: 01/16/24 1545    Specimen: Blood, Capillary Updated: 01/16/24 1555     Whole Blood Glucose POCT 264 mg/dL     Whole Blood Glucose POCT [[id number]]  (Abnormal) Collected: 01/16/24 1204    Specimen: Blood, Capillary Updated: 01/16/24 1210     Whole Blood Glucose POCT 279 mg/dL             Rads:     Radiology Results (24 Hour)       ** No results found for the last 24 hours. **            Physical Exam:     Physical Exam  Constitutional:       General: He is not in acute distress.     Appearance: Normal appearance.   HENT:      Head: Normocephalic and atraumatic.   Cardiovascular:      Pulses: Normal pulses.   Pulmonary:      Effort: Pulmonary effort is normal.   Abdominal:      General: There is no distension.      Palpations: Abdomen is soft.      Tenderness: There is no abdominal tenderness. There is no guarding or rebound.      Comments: Laparoscopic incisions well approximated with surgical glue, c/d/i   Skin:     General: Skin is warm and dry.   Neurological:      Mental Status: He is alert.              Signed by:   Inocente LITTIE Drought, MD

## 2024-01-17 NOTE — Consults (Signed)
 Start [facility] Note  Home Health Referral    Referral from [facility] (Case Manager) for home health care upon discharge.    By [employer] systems, the patient has the right to freely choose a home care provider.    A company of the patients choosing. We have supplied the patient with a listing of providers in your area who asked to be included and participate in Medicare.   [facility] a home care agency that provides adult home care services and participates in Medicare   The preferred provider of your insurance company. Choosing a home care provider other than your insurance company's preferred provider may affect your insurance coverage.      Home Health Discharge Information    Your doctor has ordered Skilled Nursing, Physical Therapy, and Occupational Therapy in-home service(s) for you while you recuperate at home, to assist you in the transition from hospital to home.    The agency that you or your representative chose to provide the service:  Name of Home Health Agency Placement: [facility]]  Phone: [phone]     [employer]:  [employer] ]  Equipment Ordered: Hospital Bed        The above services were set up by:  Murleen Curtistine Meissner, RN (Home Health Liaison)   Phone: [phone]        IF YOU HAVE NOT HEARD FROM YOUR HOME HEALTH AGENCY WITHIN 24-48 HOURS AFTER DISCHARGE PLEASE CALL YOUR AGENCY TO ARRANGE A TIME FOR YOUR FIRST VISIT. FOR ANY SCHEDULING CONCERNS OR QUESTIONS RELATED TO HOME HEALTH, SUCH AS TIME OR DATE PLEASE CONTACT YOUR HOME HEALTH AGENCY AT THE NUMBER LISTED ABOVE.    Additional comments:        START PATIENT REGISTRATION INFORMATION     Order Information  Order Signing Physician: Asfaw, Jayson Rising, MD    Service Ordered RN ?: Yes  Service Ordered PT ?: Yes  Service Ordered OT ?: Yes  Service Ordered ST ?: No    Service Ordered MSW?: No    Service Ordered HHA?: No    Following Physician: Celestina Lucie PARAS, MD   Following Physician Phone: [phone]    Overseeing Physician: N/A  (Required for Residents only)   Agreeable to Follow?: N/A  Spoke with: N/A  Date/Time of Call: 01/17/24 9:47 AM      Care Coordination   SOC Call from [employer] Required?: no  [facility]?: no  Primary Care Physician:Samantha PARAS Celestina, MD  Primary Care Physician [phone]  Primary Care Physician Address: [address] [place] [address]  PCP NPI: [id number]  Visit Instructions: N/A  Service Discharge Location Type: Home  Service Facility Name: N/A  Service Floor Facility: N/A  Service Room No: N/A    Demographics  Patient Last Name: Signa   Patient First Name: [facility]  Language/Communication Barrier: no  Service Address: [address]  [place] [address]   Service Home Phone: [phone] (home)   Other phone numbers:    Telephone Information:   Mobile [phone]     Emergency Contact: Extended Emergency Contact Information  Primary Emergency Contact: Poirier,Nicole   [place]  Home Phone: [phone]  Mobile Phone: [phone]  Relation: Daughter  Secondary Emergency Contact: Signa Cary  Address: [address]           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]  Acute UTI [N39.0]     Caregiver Information  Caregiver First Name: N/A  Caregiver Last Name: N/A  Caregiver Relationship to Patient: Other  Caregiver Phone Number: N/A  Caregiver Notes: N/A              Data Processing Manager Information  Primary Subscriber:   Primary Subscriber Relation To Guarantor:   Primary Payor:   Primary Plan:   Primary Group #:    Primary Subscriber ID:    Primary Subscriber DOB:   Secondary Insurance Information  Secondary Subscriber:   Secondary Subscriber Relation To Guarantor:   Secondary Payor:   Secondary Plan:   Secondary Group #:   Secondary Subscriber  ID:   Secondary Subscriber DOB:   HITECH  NO      END PATIENT REGISTRATION INFORMATION       Diagnosis: 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

## 2024-01-17 NOTE — Progress Notes (Signed)
 Mayann RN completed discharge teaching with the patient at 1330. Stretcher transport has been arranged at 1400 to transport patient home.

## 2024-01-17 NOTE — Progress Notes (Signed)
 This RN got a call from [facility] dispatcher at [phone], [facility] transportation will be coming around 1730 - 1800.

## 2024-01-17 NOTE — Plan of Care (Signed)
 Problem: Pain interferes with ability to perform ADL  Goal: Pain at adequate level as identified by patient  Outcome: Progressing  Flowsheets (Taken 01/17/2024 0146)  Pain at adequate level as identified by patient:   Identify patient comfort function goal   Assess for risk of opioid induced respiratory depression, including snoring/sleep apnea. Alert healthcare team of risk factors identified.   Assess pain on admission, during daily assessment and/or before any as needed intervention(s)   Reassess pain within 30-60 minutes of any procedure/intervention, per Pain Assessment, Intervention, Reassessment (AIR) Cycle   Offer non-pharmacological pain management interventions     Problem: Side Effects from Pain Analgesia  Goal: Patient will experience minimal side effects of analgesic therapy  Outcome: Progressing  Flowsheets (Taken 01/17/2024 0146)  Patient will experience minimal side effects of analgesic therapy:   Monitor/assess patient's respiratory status (RR depth, effort, breath sounds)   Assess for changes in cognitive function   Prevent/manage side effects per LIP orders (i.e. nausea, vomiting, pruritus, constipation, urinary retention, etc.)  Note: No signs of respiratory distress,defecated, voiding freely,      Problem: SCIP  Goal: SCIP measures are followed  Flowsheets (Taken 01/17/2024 0146)  SCIP measures are followed: VTE Prevention: Administer anticoagulant(s) and/or apply anti-embolism stockings/devices as ordered  Note: Wearing SCD's     Problem: Infection Prevention  Goal: Free from infection  Flowsheets (Taken 01/17/2024 0146)  Free from infection:   Monitor/assess vital signs   Encourage/assist patient to turn, cough and perform deep breathing every 2 hours   Assess incision for evidence of healing   Assess for signs and symptoms of infection   Monitor/assess lab values and report abnormal values  Note: Wounds CDI, VSS ,      Problem: Everyday - Heart Failure  Goal: Stable Vital Signs and Fluid  Balance  Outcome: Progressing  Flowsheets (Taken 01/17/2024 0146)  Stable Vital Signs and Fluid Balance:   Daily Standing Weights in the morning using the same scale, after using the bathroom and before breadfast.  If unable to stand, zero the bed and use the bed scale   Assess for swelling/edema  Goal: Mobility/Activity is Maintained at Optimal Level for Patient  Outcome: Progressing  Flowsheets (Taken 01/17/2024 0146)  Mobility/Activity is Maintained at Optimal Level for Patient:   Maintain SCD's as Ordered   Perform active/passive ROM   Reposition patient every 2 hours and as needed unless able to reposition self   Assess for changes in respiratory status, level of consciousness and/or development of fatigue  Goal: Nutritional Intake is Adequate  Outcome: Progressing  Flowsheets (Taken 01/17/2024 0146)  Nutritional Intake is Adequate: Encourage/perform oral hygiene as appropriate  Goal: Teaching-Using CHF Warning Zones and Educational Videos  Outcome: Progressing  Flowsheets (Taken 01/17/2024 0146)  Teaching-Using CHF Warning Zones and Educational Videos: Daily Standing Weights & record

## 2024-01-17 NOTE — Plan of Care (Signed)
 Discharge order verified, pt ID verified    Pt cleared for discharge. RN provided discharge instructions along with information regarding AVS, post operative care, medication regimen, and f/u appointments to the pt and caregiver Juliana. Pt verbalized understanding of the preceding information and able to teach back. All questions and concerns were addressed. CM to arrange stretcher transport to home.    VSSBP (!) 170/93   Pulse 71   Temp 97.5 F (36.4 C) (Oral)   Resp 16   Ht 1.867 m (6' 1.5)   Wt 90.3 kg (199 lb 1.2 oz)   SpO2 98%   BMI 25.91 kg/m

## 2024-01-18 LAB — CULTURE BLOOD AEROBIC AND ANAEROBIC: Culture Blood: NO GROWTH

## 2024-01-21 NOTE — Progress Notes (Signed)
 Name: Roy Delgado    ### Patient Details  Date of Birth: 01-Jan-1936  MRN: [id number]    ### Encounter Details  Arrival Date: 01/13/2024 02:21 PM EST  Discharge Date: 01/17/2024 07:03 PM EST  Encounter ID: [id number] TCM1/24/2025   7:03:00PM    ### Related interaction  [place] - TCM V2 Post Discharge Outreach (Post Discharge TCM V2 Outreach 1) ([email or web address])    ### Required Interventions and Feedback     General Status          General Status - Issues:     Related to original diagnosis (edited by NG on 01/21/2024 08:19 AM EST)    Comments:     Volvulus (edited by NG on 01/21/2024 08:19 AM EST)     General Status - Actions Taken         No Interventions Needed:     Yes (edited by NG on 01/21/2024 08:19 AM EST)    Comments:     Volvulus (edited by NG on 01/21/2024 08:19 AM EST)     Additional Questions         Comments:     Volvulus. No ACM call made. (edited by NG on 01/21/2024 08:19 AM EST)    Inocente Prophet, MS, [facility]  Pronouns She/Her  Ambulatory Case Manager    [facility]  [address]  [place], [place] [address]  [place] [phone]  [email or web address]

## 2024-02-23 ENCOUNTER — Encounter
Admission: EM | Disposition: A | Discharge status: Home Health | Payer: Self-pay | Source: Home / Self Care | Attending: Internal Medicine

## 2024-02-23 ENCOUNTER — Emergency Department

## 2024-02-23 ENCOUNTER — Inpatient Hospital Stay: Admitting: Anesthesiology

## 2024-02-23 ENCOUNTER — Inpatient Hospital Stay

## 2024-02-23 ENCOUNTER — Inpatient Hospital Stay
Admission: EM | Admit: 2024-02-23 | Discharge: 2024-02-27 | DRG: 335 | Disposition: A | Discharge status: Home Health | Attending: Internal Medicine | Admitting: Internal Medicine

## 2024-02-23 DIAGNOSIS — K56609 Unspecified intestinal obstruction, unspecified as to partial versus complete obstruction: Secondary | ICD-10-CM

## 2024-02-23 DIAGNOSIS — Z8546 Personal history of malignant neoplasm of prostate: Secondary | ICD-10-CM

## 2024-02-23 DIAGNOSIS — Q433 Congenital malformations of intestinal fixation: Secondary | ICD-10-CM

## 2024-02-23 DIAGNOSIS — N179 Acute kidney failure, unspecified: Secondary | ICD-10-CM | POA: Diagnosis present

## 2024-02-23 DIAGNOSIS — R339 Retention of urine, unspecified: Secondary | ICD-10-CM | POA: Diagnosis present

## 2024-02-23 DIAGNOSIS — E871 Hypo-osmolality and hyponatremia: Secondary | ICD-10-CM | POA: Diagnosis not present

## 2024-02-23 DIAGNOSIS — K565 Intestinal adhesions [bands], unspecified as to partial versus complete obstruction: Secondary | ICD-10-CM | POA: Diagnosis present

## 2024-02-23 DIAGNOSIS — E1165 Type 2 diabetes mellitus with hyperglycemia: Secondary | ICD-10-CM | POA: Diagnosis present

## 2024-02-23 DIAGNOSIS — I5022 Chronic systolic (congestive) heart failure: Secondary | ICD-10-CM | POA: Diagnosis present

## 2024-02-23 DIAGNOSIS — E1142 Type 2 diabetes mellitus with diabetic polyneuropathy: Secondary | ICD-10-CM | POA: Diagnosis present

## 2024-02-23 DIAGNOSIS — Z5331 Laparoscopic surgical procedure converted to open procedure: Secondary | ICD-10-CM

## 2024-02-23 DIAGNOSIS — F39 Unspecified mood [affective] disorder: Secondary | ICD-10-CM | POA: Diagnosis present

## 2024-02-23 DIAGNOSIS — H409 Unspecified glaucoma: Secondary | ICD-10-CM | POA: Diagnosis present

## 2024-02-23 DIAGNOSIS — J111 Influenza due to unidentified influenza virus with other respiratory manifestations: Principal | ICD-10-CM | POA: Diagnosis present

## 2024-02-23 DIAGNOSIS — N189 Chronic kidney disease, unspecified: Secondary | ICD-10-CM | POA: Diagnosis present

## 2024-02-23 DIAGNOSIS — J9601 Acute respiratory failure with hypoxia: Secondary | ICD-10-CM | POA: Diagnosis present

## 2024-02-23 DIAGNOSIS — J101 Influenza due to other identified influenza virus with other respiratory manifestations: Secondary | ICD-10-CM | POA: Diagnosis present

## 2024-02-23 DIAGNOSIS — E860 Dehydration: Secondary | ICD-10-CM | POA: Diagnosis present

## 2024-02-23 DIAGNOSIS — K450 Other specified abdominal hernia with obstruction, without gangrene: Secondary | ICD-10-CM | POA: Diagnosis present

## 2024-02-23 DIAGNOSIS — I130 Hypertensive heart and chronic kidney disease with heart failure and stage 1 through stage 4 chronic kidney disease, or unspecified chronic kidney disease: Secondary | ICD-10-CM | POA: Diagnosis present

## 2024-02-23 DIAGNOSIS — Z794 Long term (current) use of insulin: Secondary | ICD-10-CM

## 2024-02-23 DIAGNOSIS — E11649 Type 2 diabetes mellitus with hypoglycemia without coma: Secondary | ICD-10-CM | POA: Diagnosis not present

## 2024-02-23 DIAGNOSIS — Z79899 Other long term (current) drug therapy: Secondary | ICD-10-CM

## 2024-02-23 DIAGNOSIS — K562 Volvulus: Principal | ICD-10-CM | POA: Diagnosis present

## 2024-02-23 HISTORY — PX: LAPAROSCOPY, DIAGNOSTIC: SHX4584

## 2024-02-23 LAB — LAB USE ONLY - CBC WITH DIFFERENTIAL
Absolute Basophils: 0.04 10*3/uL (ref 0.00–0.08)
Absolute Eosinophils: 0.27 10*3/uL (ref 0.00–0.44)
Absolute Immature Granulocytes: 0.03 10*3/uL (ref 0.00–0.07)
Absolute Lymphocytes: 0.81 10*3/uL (ref 0.42–3.22)
Absolute Monocytes: 0.42 10*3/uL (ref 0.21–0.85)
Absolute Neutrophils: 4.47 10*3/uL (ref 1.10–6.33)
Absolute nRBC: 0 10*3/uL (ref <=0.00)
Basophils %: 0.7 %
Eosinophils %: 4.5 %
Hematocrit: 40.6 % (ref 37.6–49.6)
Hemoglobin: 13.2 g/dL (ref 12.5–17.1)
Immature Granulocytes %: 0.5 %
Lymphocytes %: 13.4 %
MCH: 29 pg (ref 25.1–33.5)
MCHC: 32.5 g/dL (ref 31.5–35.8)
MCV: 89.2 fL (ref 78.0–96.0)
MPV: 10.9 fL (ref 8.9–12.5)
Monocytes %: 7 %
Neutrophils %: 73.9 %
Platelet Count: 351 10*3/uL — ABNORMAL HIGH (ref 142–346)
Preliminary Absolute Neutrophil Count: 4.47 10*3/uL (ref 1.10–6.33)
RBC: 4.55 10*6/uL (ref 4.20–5.90)
RDW: 15 % (ref 11–15)
WBC: 6.04 10*3/uL (ref 3.10–9.50)
nRBC %: 0 /100{WBCs} (ref <=0.0)

## 2024-02-23 LAB — HIGH SENSITIVITY TROPONIN-I: hs Troponin: 10.4 ng/L (ref <=35.0)

## 2024-02-23 LAB — COVID-19 (SARS-COV-2) & INFLUENZA  A/B, NAA (ROCHE LIAT)
Influenza A RNA: DETECTED — AB
Influenza B RNA: NOT DETECTED
SARS-CoV-2 (COVID-19) RNA: NOT DETECTED

## 2024-02-23 LAB — COMPREHENSIVE METABOLIC PANEL
ALT: 12 U/L (ref <=55)
AST (SGOT): 25 U/L (ref <=41)
Albumin/Globulin Ratio: 1 (ref 0.9–2.2)
Albumin: 4 g/dL (ref 3.5–5.0)
Alkaline Phosphatase: 162 U/L — ABNORMAL HIGH (ref 37–117)
Anion Gap: 11 (ref 5.0–15.0)
BUN: 27 mg/dL (ref 9–28)
Bilirubin, Total: 0.5 mg/dL (ref 0.2–1.2)
CO2: 25 meq/L (ref 17–29)
Calcium: 9.4 mg/dL (ref 7.9–10.2)
Chloride: 103 meq/L (ref 99–111)
Creatinine: 1.3 mg/dL (ref 0.5–1.5)
GFR: 53.8 mL/min/{1.73_m2} — ABNORMAL LOW (ref >=60.0)
Globulin: 4.1 g/dL — ABNORMAL HIGH (ref 2.0–3.6)
Glucose: 157 mg/dL — ABNORMAL HIGH (ref 70–100)
Potassium: 5.3 meq/L (ref 3.5–5.3)
Protein, Total: 8.1 g/dL (ref 6.0–8.3)
Sodium: 139 meq/L (ref 135–145)

## 2024-02-23 LAB — LACTIC ACID: Whole Blood Lactic Acid: 1.8 mmol/L (ref 0.2–2.0)

## 2024-02-23 LAB — LIPASE: Lipase: 19 U/L (ref 8–78)

## 2024-02-23 LAB — WHOLE BLOOD GLUCOSE POCT
Whole Blood Glucose POCT: 235 mg/dL — ABNORMAL HIGH (ref 70–100)
Whole Blood Glucose POCT: 187 mg/dL — ABNORMAL HIGH (ref 70–100)
Whole Blood Glucose POCT: 195 mg/dL — ABNORMAL HIGH (ref 70–100)
Whole Blood Glucose POCT: 235 mg/dL — ABNORMAL HIGH (ref 70–100)
Whole Blood Glucose POCT: 247 mg/dL — ABNORMAL HIGH (ref 70–100)
Whole Blood Glucose POCT: 239 mg/dL — ABNORMAL HIGH (ref 70–100)

## 2024-02-23 SURGERY — LAPAROSCOPY, DIAGNOSTIC
Anesthesia: Anesthesia General | Site: Pelvis | Wound class: Clean

## 2024-02-23 MED ORDER — POTASSIUM CHLORIDE 20 MEQ PO PACK
0.0000 meq | PACK | ORAL | Status: DC | PRN
Start: 2024-02-23 — End: 2024-02-27

## 2024-02-23 MED ORDER — LOSARTAN POTASSIUM 25 MG PO TABS
50.0000 mg | ORAL_TABLET | Freq: Every day | ORAL | Status: DC
Start: 2024-02-23 — End: 2024-02-27
  Administered 2024-02-23 – 2024-02-27 (×5): 50 mg via ORAL
  Filled 2024-02-23 (×5): qty 2

## 2024-02-23 MED ORDER — BUPIVACAINE HCL (PF) 0.5 % IJ SOLN
INTRAMUSCULAR | Status: AC
Start: 2024-02-23 — End: ?
  Filled 2024-02-23: qty 90

## 2024-02-23 MED ORDER — ACETAMINOPHEN 325 MG PO TABS
650.0000 mg | ORAL_TABLET | ORAL | Status: DC | PRN
Start: 2024-02-23 — End: 2024-02-23

## 2024-02-23 MED ORDER — BENZONATATE 100 MG PO CAPS
100.0000 mg | ORAL_CAPSULE | Freq: Three times a day (TID) | ORAL | Status: DC | PRN
Start: 2024-02-23 — End: 2024-02-27

## 2024-02-23 MED ORDER — DEXAMETHASONE SOD PHOSPHATE PF 10 MG/ML IJ SOLN
Freq: Once | INTRAMUSCULAR | Status: DC | PRN
Start: 2024-02-23 — End: 2024-02-23

## 2024-02-23 MED ORDER — FENTANYL CITRATE (PF) 50 MCG/ML IJ SOLN (WRAP)
50.0000 ug | INTRAMUSCULAR | Status: DC | PRN
Start: 2024-02-23 — End: 2024-02-23
  Administered 2024-02-23 (×2): 50 ug via INTRAVENOUS
  Filled 2024-02-23: qty 2

## 2024-02-23 MED ORDER — MELATONIN 3 MG PO TABS
3.0000 mg | ORAL_TABLET | Freq: Every evening | ORAL | Status: DC | PRN
Start: 2024-02-23 — End: 2024-02-27

## 2024-02-23 MED ORDER — ALBUMIN HUMAN/BIOSIMILIAR 5% IV SOLN (WRAP)
INTRAVENOUS | Status: DC | PRN
Start: 2024-02-23 — End: 2024-02-23

## 2024-02-23 MED ORDER — PREGABALIN 100 MG PO CAPS
100.0000 mg | ORAL_CAPSULE | Freq: Every evening | ORAL | Status: DC
Start: 2024-02-23 — End: 2024-02-27
  Administered 2024-02-23 – 2024-02-26 (×4): 100 mg via ORAL
  Filled 2024-02-23 (×4): qty 1

## 2024-02-23 MED ORDER — STERILE WATER FOR INJECTION IJ/IV SOLN (WRAP)
2.0000 g | INTRAMUSCULAR | Status: AC
Start: 2024-02-23 — End: 2024-02-23
  Administered 2024-02-23: 2 g via INTRAVENOUS

## 2024-02-23 MED ORDER — BENZOCAINE-MENTHOL MT LOZG (WRAP)
1.0000 | LOZENGE | OROMUCOSAL | Status: DC | PRN
Start: 2024-02-23 — End: 2024-02-27
  Administered 2024-02-25: 1 via BUCCAL
  Filled 2024-02-23: qty 1

## 2024-02-23 MED ORDER — HYDROMORPHONE HCL 1 MG/ML IJ SOLN
0.4000 mg | INTRAMUSCULAR | Status: DC | PRN
Start: 2024-02-23 — End: 2024-02-24
  Administered 2024-02-24 (×3): 0.4 mg via INTRAVENOUS
  Filled 2024-02-23 (×4): qty 1

## 2024-02-23 MED ORDER — BUPIVACAINE HCL (PF) 0.5 % IJ SOLN
INTRAMUSCULAR | Status: DC | PRN
Start: 2024-02-23 — End: 2024-02-23
  Administered 2024-02-23: 8 mL

## 2024-02-23 MED ORDER — MAGNESIUM SULFATE IN D5W 1-5 GM/100ML-% IV SOLN
1.0000 g | INTRAVENOUS | Status: AC | PRN
Start: 2024-02-23 — End: ?
  Administered 2024-02-24 (×2): 1 g via INTRAVENOUS
  Filled 2024-02-23 (×2): qty 100

## 2024-02-23 MED ORDER — PROPOFOL 10 MG/ML IV EMUL (WRAP)
INTRAVENOUS | Status: AC
Start: 2024-02-23 — End: ?
  Filled 2024-02-23: qty 40

## 2024-02-23 MED ORDER — SODIUM CHLORIDE 0.9 % IV BOLUS
1000.0000 mL | Freq: Once | INTRAVENOUS | Status: AC
Start: 2024-02-23 — End: 2024-02-23
  Administered 2024-02-23: 1000 mL via INTRAVENOUS

## 2024-02-23 MED ORDER — DEXAMETHASONE SOD PHOSPHATE PF 10 MG/ML IJ SOLN
Freq: Once | INTRAMUSCULAR | Status: AC | PRN
Start: 2024-02-23 — End: 2024-02-23
  Administered 2024-02-23: 40 mL via PERINEURAL

## 2024-02-23 MED ORDER — ESMOLOL HCL 100 MG/10ML IV SOLN
INTRAVENOUS | Status: DC | PRN
Start: 2024-02-23 — End: 2024-02-23
  Administered 2024-02-23: 20 mg via INTRAVENOUS
  Administered 2024-02-23 (×2): 10 mg via INTRAVENOUS

## 2024-02-23 MED ORDER — GLUCAGON 1 MG IJ SOLR (WRAP)
1.0000 mg | INTRAMUSCULAR | Status: DC | PRN
Start: 2024-02-23 — End: 2024-02-27

## 2024-02-23 MED ORDER — METOCLOPRAMIDE HCL 5 MG/ML IJ SOLN
10.0000 mg | Freq: Once | INTRAMUSCULAR | Status: DC | PRN
Start: 2024-02-23 — End: 2024-02-23

## 2024-02-23 MED ORDER — CETIRIZINE HCL 5 MG/5ML PO SOLN
5.0000 mg | Freq: Every day | ORAL | Status: AC
Start: 2024-02-23 — End: ?
  Administered 2024-02-24 – 2024-02-27 (×3): 5 mg via ORAL
  Filled 2024-02-23 (×6): qty 5

## 2024-02-23 MED ORDER — HYDROMORPHONE HCL 1 MG/ML IJ SOLN
0.5000 mg | INTRAMUSCULAR | Status: DC | PRN
Start: 2024-02-23 — End: 2024-02-23
  Administered 2024-02-23 (×2): 0.5 mg via INTRAVENOUS
  Filled 2024-02-23 (×3): qty 1

## 2024-02-23 MED ORDER — HYDROMORPHONE HCL 1 MG/ML IJ SOLN
INTRAMUSCULAR | Status: DC | PRN
Start: 2024-02-23 — End: 2024-02-23
  Administered 2024-02-23: .5 mg via INTRAVENOUS

## 2024-02-23 MED ORDER — DORZOLAMIDE HCL-TIMOLOL MAL 2-0.5 % OP SOLN
1.0000 [drp] | Freq: Two times a day (BID) | OPHTHALMIC | Status: DC
Start: 2024-02-23 — End: 2024-02-27
  Administered 2024-02-23 – 2024-02-27 (×8): 1 [drp] via OPHTHALMIC
  Filled 2024-02-23: qty 10

## 2024-02-23 MED ORDER — POLYETHYLENE GLYCOL 3350 17 G PO PACK
17.0000 g | PACK | Freq: Every day | ORAL | Status: DC | PRN
Start: 2024-02-23 — End: 2024-02-27
  Administered 2024-02-26: 17 g via ORAL
  Filled 2024-02-23: qty 1

## 2024-02-23 MED ORDER — PREGABALIN 100 MG PO CAPS
100.0000 mg | ORAL_CAPSULE | Freq: Every day | ORAL | Status: DC
Start: 2024-02-23 — End: 2024-02-23

## 2024-02-23 MED ORDER — ONDANSETRON 4 MG PO TBDP
4.0000 mg | ORAL_TABLET | Freq: Four times a day (QID) | ORAL | Status: DC | PRN
Start: 2024-02-23 — End: 2024-02-27

## 2024-02-23 MED ORDER — NEOSTIGMINE METHYLSULFATE 1 MG/ML IJ/IV SOLN (WRAP)
Status: AC
Start: 2024-02-23 — End: ?
  Filled 2024-02-23: qty 10

## 2024-02-23 MED ORDER — BUPIVACAINE-EPINEPHRINE (PF) 0.5% -1:200000 IJ SOLN
INTRAMUSCULAR | Status: AC
Start: 2024-02-23 — End: ?
  Filled 2024-02-23: qty 60

## 2024-02-23 MED ORDER — FENTANYL CITRATE (PF) 50 MCG/ML IJ SOLN (WRAP)
INTRAMUSCULAR | Status: DC | PRN
Start: 2024-02-23 — End: 2024-02-23
  Administered 2024-02-23: 25 ug via INTRAVENOUS
  Administered 2024-02-23: 50 ug via INTRAVENOUS
  Administered 2024-02-23: 25 ug via INTRAVENOUS
  Administered 2024-02-23: 50 ug via INTRAVENOUS
  Administered 2024-02-23 (×2): 25 ug via INTRAVENOUS

## 2024-02-23 MED ORDER — INSULIN LISPRO 100 UNIT/ML SOLN (WRAP)
1.0000 [IU] | Freq: Three times a day (TID) | Status: DC
Start: 2024-02-23 — End: 2024-02-27
  Administered 2024-02-23: 2 [IU] via SUBCUTANEOUS
  Administered 2024-02-24: 3 [IU] via SUBCUTANEOUS
  Administered 2024-02-24 – 2024-02-26 (×3): 2 [IU] via SUBCUTANEOUS
  Administered 2024-02-26: 3 [IU] via SUBCUTANEOUS
  Administered 2024-02-27: 5 [IU] via SUBCUTANEOUS
  Administered 2024-02-27: 2 [IU] via SUBCUTANEOUS
  Administered 2024-02-27: 5 [IU] via SUBCUTANEOUS
  Filled 2024-02-23: qty 9
  Filled 2024-02-23 (×2): qty 6
  Filled 2024-02-23: qty 9
  Filled 2024-02-23 (×3): qty 6
  Filled 2024-02-23 (×2): qty 15

## 2024-02-23 MED ORDER — ENOXAPARIN SODIUM 40 MG/0.4ML IJ SOSY
40.0000 mg | PREFILLED_SYRINGE | Freq: Every day | INTRAMUSCULAR | Status: DC
Start: 2024-02-23 — End: 2024-02-27
  Administered 2024-02-24 – 2024-02-27 (×4): 40 mg via SUBCUTANEOUS
  Filled 2024-02-23 (×4): qty 0.4

## 2024-02-23 MED ORDER — ROCURONIUM BROMIDE 10 MG/ML IV SOLN (WRAP)
INTRAVENOUS | Status: DC | PRN
Start: 2024-02-23 — End: 2024-02-23
  Administered 2024-02-23: 20 mg via INTRAVENOUS
  Administered 2024-02-23: 10 mg via INTRAVENOUS
  Administered 2024-02-23: 30 mg via INTRAVENOUS

## 2024-02-23 MED ORDER — ONDANSETRON HCL 4 MG/2ML IJ SOLN
4.0000 mg | Freq: Four times a day (QID) | INTRAMUSCULAR | Status: AC | PRN
Start: 2024-02-23 — End: ?
  Administered 2024-02-24 (×2): 4 mg via INTRAVENOUS
  Filled 2024-02-23 (×2): qty 2

## 2024-02-23 MED ORDER — OXYCODONE HCL 5 MG PO TABS
5.0000 mg | ORAL_TABLET | Freq: Once | ORAL | Status: DC | PRN
Start: 2024-02-23 — End: 2024-02-23

## 2024-02-23 MED ORDER — LACTATED RINGERS IV SOLN
INTRAVENOUS | Status: AC
Start: 2024-02-23 — End: 2024-02-24

## 2024-02-23 MED ORDER — ROCURONIUM BROMIDE 50 MG/5ML IV SOLN
INTRAVENOUS | Status: AC
Start: 2024-02-23 — End: ?
  Filled 2024-02-23: qty 5

## 2024-02-23 MED ORDER — DEXAMETHASONE SODIUM PHOSPHATE 4 MG/ML IJ SOLN
INTRAMUSCULAR | Status: AC
Start: 2024-02-23 — End: ?
  Filled 2024-02-23: qty 1

## 2024-02-23 MED ORDER — LIDOCAINE HCL URETHRAL/MUCOSAL 2 % EX PRSY
PREFILLED_SYRINGE | Freq: Once | CUTANEOUS | Status: AC | PRN
Start: 2024-02-23 — End: 2024-02-23
  Filled 2024-02-23: qty 6

## 2024-02-23 MED ORDER — POTASSIUM CHLORIDE CRYS ER 20 MEQ PO TBCR
0.0000 meq | EXTENDED_RELEASE_TABLET | ORAL | Status: DC | PRN
Start: 2024-02-23 — End: 2024-02-27

## 2024-02-23 MED ORDER — SUCCINYLCHOLINE CHLORIDE 20 MG/ML IJ SOLN
INTRAMUSCULAR | Status: DC | PRN
Start: 2024-02-23 — End: 2024-02-23
  Administered 2024-02-23: 100 mg via INTRAVENOUS

## 2024-02-23 MED ORDER — HYDROMORPHONE HCL 1 MG/ML IJ SOLN
0.2000 mg | INTRAMUSCULAR | Status: DC | PRN
Start: 2024-02-23 — End: 2024-02-24

## 2024-02-23 MED ORDER — MONTELUKAST SODIUM 10 MG PO TABS
10.0000 mg | ORAL_TABLET | Freq: Every evening | ORAL | Status: DC
Start: 2024-02-23 — End: 2024-02-27
  Administered 2024-02-23 – 2024-02-26 (×4): 10 mg via ORAL
  Filled 2024-02-23 (×4): qty 1

## 2024-02-23 MED ORDER — IOHEXOL 350 MG/ML IV SOLN
100.0000 mL | Freq: Once | INTRAVENOUS | Status: AC | PRN
Start: 2024-02-23 — End: 2024-02-23
  Administered 2024-02-23: 100 mL via INTRAVENOUS

## 2024-02-23 MED ORDER — ESMOLOL HCL 100 MG/10ML IV SOLN
INTRAVENOUS | Status: AC
Start: 2024-02-23 — End: ?
  Filled 2024-02-23: qty 10

## 2024-02-23 MED ORDER — POTASSIUM CHLORIDE 10 MEQ/100ML IV SOLN (WRAP)
10.0000 meq | INTRAVENOUS | Status: DC | PRN
Start: 2024-02-23 — End: 2024-02-27

## 2024-02-23 MED ORDER — NALOXONE HCL 0.4 MG/ML IJ SOLN (WRAP)
0.2000 mg | INTRAMUSCULAR | Status: DC | PRN
Start: 2024-02-23 — End: 2024-02-27

## 2024-02-23 MED ORDER — INSULIN GLARGINE 100 UNIT/ML SC SOLN
20.0000 [IU] | Freq: Every day | SUBCUTANEOUS | Status: DC
Start: 2024-02-23 — End: 2024-02-23

## 2024-02-23 MED ORDER — ACETAMINOPHEN 10 MG/ML IV SOLN
1000.0000 mg | Freq: Three times a day (TID) | INTRAVENOUS | Status: AC
Start: 2024-02-23 — End: 2024-02-24
  Administered 2024-02-23 – 2024-02-24 (×3): 1000 mg via INTRAVENOUS
  Filled 2024-02-23 (×3): qty 100

## 2024-02-23 MED ORDER — SERTRALINE HCL 25 MG PO TABS
25.0000 mg | ORAL_TABLET | Freq: Every day | ORAL | Status: DC
Start: 2024-02-23 — End: 2024-02-27
  Administered 2024-02-23 – 2024-02-27 (×5): 25 mg via ORAL
  Filled 2024-02-23 (×5): qty 1

## 2024-02-23 MED ORDER — SALINE SPRAY 0.65 % NA SOLN
2.0000 | NASAL | Status: DC | PRN
Start: 2024-02-23 — End: 2024-02-27

## 2024-02-23 MED ORDER — DEXAMETHASONE SODIUM PHOSPHATE 4 MG/ML IJ SOLN (WRAP)
INTRAMUSCULAR | Status: DC | PRN
Start: 2024-02-23 — End: 2024-02-23
  Administered 2024-02-23: 4 mg via INTRAVENOUS

## 2024-02-23 MED ORDER — DEXTROSE 10 % IV BOLUS
12.5000 g | INTRAVENOUS | Status: DC | PRN
Start: 2024-02-23 — End: 2024-02-27
  Administered 2024-02-25: 125 mL via INTRAVENOUS
  Filled 2024-02-23: qty 250

## 2024-02-23 MED ORDER — ONDANSETRON HCL 4 MG/2ML IJ SOLN
4.0000 mg | Freq: Once | INTRAMUSCULAR | Status: AC
Start: 2024-02-23 — End: 2024-02-23
  Administered 2024-02-23: 4 mg via INTRAVENOUS
  Filled 2024-02-23: qty 2

## 2024-02-23 MED ORDER — MORPHINE SULFATE 4 MG/ML IJ/IV SOLN (WRAP)
4.0000 mg | Freq: Once | Status: AC
Start: 2024-02-23 — End: 2024-02-23
  Administered 2024-02-23: 4 mg via INTRAVENOUS
  Filled 2024-02-23: qty 1

## 2024-02-23 MED ORDER — PHENYLEPHRINE 100 MCG/ML IV BOLUS (ANESTHESIA)
PREFILLED_SYRINGE | INTRAVENOUS | Status: DC | PRN
Start: 2024-02-23 — End: 2024-02-23
  Administered 2024-02-23: 50 ug via INTRAVENOUS
  Administered 2024-02-23 (×3): 100 ug via INTRAVENOUS
  Administered 2024-02-23: 200 ug via INTRAVENOUS
  Administered 2024-02-23 (×2): 100 ug via INTRAVENOUS

## 2024-02-23 MED ORDER — INSULIN LISPRO 100 UNIT/ML SOLN (WRAP)
1.0000 [IU] | Freq: Every evening | Status: AC
Start: 2024-02-23 — End: ?
  Administered 2024-02-23: 1 [IU] via SUBCUTANEOUS
  Administered 2024-02-26: 2 [IU] via SUBCUTANEOUS
  Filled 2024-02-23: qty 3
  Filled 2024-02-23: qty 6

## 2024-02-23 MED ORDER — GLYCOPYRROLATE 0.2 MG/ML IJ SOLN (WRAP)
INTRAMUSCULAR | Status: AC
Start: 2024-02-23 — End: ?
  Filled 2024-02-23: qty 3

## 2024-02-23 MED ORDER — CARBOXYMETHYLCELLULOSE SODIUM 0.5 % OP SOLN
1.0000 [drp] | Freq: Three times a day (TID) | OPHTHALMIC | Status: DC | PRN
Start: 2024-02-23 — End: 2024-02-27

## 2024-02-23 MED ORDER — ONDANSETRON HCL 4 MG/2ML IJ SOLN
INTRAMUSCULAR | Status: AC
Start: 2024-02-23 — End: ?
  Filled 2024-02-23: qty 2

## 2024-02-23 MED ORDER — PROPOFOL 10 MG/ML IV EMUL (WRAP)
INTRAVENOUS | Status: DC | PRN
Start: 2024-02-23 — End: 2024-02-23
  Administered 2024-02-23: 125 mg via INTRAVENOUS

## 2024-02-23 MED ORDER — SODIUM CHLORIDE 0.45 % IV SOLN
INTRAVENOUS | Status: DC
Start: 2024-02-23 — End: 2024-02-23

## 2024-02-23 MED ORDER — LOSARTAN POTASSIUM 25 MG PO TABS
25.0000 mg | ORAL_TABLET | Freq: Two times a day (BID) | ORAL | Status: DC
Start: 2024-02-23 — End: 2024-02-23

## 2024-02-23 MED ORDER — FENTANYL CITRATE (PF) 50 MCG/ML IJ SOLN (WRAP)
INTRAMUSCULAR | Status: AC
Start: 2024-02-23 — End: ?
  Filled 2024-02-23: qty 2

## 2024-02-23 MED ORDER — ONDANSETRON HCL 4 MG/2ML IJ SOLN
INTRAMUSCULAR | Status: DC | PRN
Start: 2024-02-23 — End: 2024-02-23
  Administered 2024-02-23: 4 mg via INTRAVENOUS

## 2024-02-23 MED ORDER — PREGABALIN 100 MG PO CAPS
100.0000 mg | ORAL_CAPSULE | Freq: Every evening | ORAL | Status: DC
Start: 2024-02-23 — End: 2024-02-23

## 2024-02-23 MED ORDER — GLUCOSE 40 % PO GEL (WRAP)
15.0000 g | ORAL | Status: DC | PRN
Start: 2024-02-23 — End: 2024-02-27
  Administered 2024-02-25: 15 g via ORAL
  Filled 2024-02-23: qty 12.8

## 2024-02-23 MED ORDER — ONDANSETRON HCL 4 MG/2ML IJ SOLN
4.0000 mg | Freq: Once | INTRAMUSCULAR | Status: DC | PRN
Start: 2024-02-23 — End: 2024-02-23

## 2024-02-23 MED ORDER — INSULIN GLARGINE 100 UNIT/ML SC SOLN
10.0000 [IU] | Freq: Every day | SUBCUTANEOUS | Status: DC
Start: 2024-02-23 — End: 2024-02-24
  Administered 2024-02-24: 10 [IU] via SUBCUTANEOUS
  Filled 2024-02-23: qty 10

## 2024-02-23 MED ORDER — INSULIN LISPRO 100 UNIT/ML SOLN (WRAP)
3.0000 [IU] | Freq: Once | Status: AC
Start: 2024-02-23 — End: 2024-02-23
  Administered 2024-02-23: 3 [IU] via SUBCUTANEOUS
  Filled 2024-02-23: qty 9

## 2024-02-23 MED ORDER — PREGABALIN 100 MG PO CAPS
100.0000 mg | ORAL_CAPSULE | Freq: Every evening | ORAL | Status: DC
Start: 2024-02-24 — End: 2024-02-23

## 2024-02-23 MED ORDER — HYDROCHLOROTHIAZIDE 25 MG PO TABS
25.0000 mg | ORAL_TABLET | Freq: Every day | ORAL | Status: DC
Start: 2024-02-23 — End: 2024-02-23

## 2024-02-23 MED ORDER — NEOSTIGMINE METHYLSULFATE 1 MG/ML IJ/IV SOLN (WRAP)
Status: DC | PRN
Start: 2024-02-23 — End: 2024-02-23
  Administered 2024-02-23: 5 mg via INTRAVENOUS

## 2024-02-23 MED ORDER — LIDOCAINE HCL 2 % IJ SOLN
INTRAMUSCULAR | Status: DC | PRN
Start: 2024-02-23 — End: 2024-02-23
  Administered 2024-02-23: 50 mg

## 2024-02-23 MED ORDER — VALSARTAN 160 MG PO TABS
320.0000 mg | ORAL_TABLET | Freq: Every day | ORAL | Status: DC
Start: 2024-02-23 — End: 2024-02-23

## 2024-02-23 MED ORDER — DEXTROSE 50 % IV SOLN
12.5000 g | INTRAVENOUS | Status: DC | PRN
Start: 2024-02-23 — End: 2024-02-27

## 2024-02-23 MED ORDER — BUPIVACAINE HCL (PF) 0.25 % IJ SOLN
INTRAMUSCULAR | Status: AC
Start: 2024-02-23 — End: ?
  Filled 2024-02-23: qty 40

## 2024-02-23 MED ORDER — OSELTAMIVIR PHOSPHATE 75 MG PO CAPS
75.0000 mg | ORAL_CAPSULE | Freq: Two times a day (BID) | ORAL | Status: DC
Start: 2024-02-23 — End: 2024-02-23

## 2024-02-23 MED ORDER — POTASSIUM & SODIUM PHOSPHATES 280-160-250 MG PO PACK
2.0000 | PACK | ORAL | Status: DC | PRN
Start: 2024-02-23 — End: 2024-02-27

## 2024-02-23 MED ORDER — GLYCOPYRROLATE 0.2 MG/ML IJ SOLN (WRAP)
INTRAMUSCULAR | Status: DC | PRN
Start: 2024-02-23 — End: 2024-02-23
  Administered 2024-02-23: .6 mg via INTRAVENOUS

## 2024-02-23 MED ORDER — OSELTAMIVIR PHOSPHATE 30 MG PO CAPS
30.0000 mg | ORAL_CAPSULE | Freq: Two times a day (BID) | ORAL | Status: DC
Start: 2024-02-23 — End: 2024-02-28
  Administered 2024-02-23 – 2024-02-27 (×8): 30 mg via ORAL
  Filled 2024-02-23 (×9): qty 1

## 2024-02-23 MED ORDER — SODIUM CHLORIDE 0.9 % IV BOLUS
500.0000 mL | Freq: Once | INTRAVENOUS | Status: DC
Start: 2024-02-23 — End: 2024-02-27

## 2024-02-23 MED ORDER — SODIUM CHLORIDE 0.9 % IV SOLN
INTRAVENOUS | Status: DC | PRN
Start: 2024-02-23 — End: 2024-02-23

## 2024-02-23 MED ORDER — INSULIN LISPRO 100 UNIT/ML SOLN (WRAP)
3.0000 [IU] | Freq: Once | Status: DC
Start: 2024-02-23 — End: 2024-02-23

## 2024-02-23 MED ORDER — METOPROLOL SUCCINATE ER 25 MG PO TB24
25.0000 mg | ORAL_TABLET | Freq: Every day | ORAL | Status: AC
Start: 2024-02-23 — End: ?
  Administered 2024-02-24 – 2024-02-27 (×4): 25 mg via ORAL
  Filled 2024-02-23 (×4): qty 1

## 2024-02-23 MED ORDER — OXYCODONE HCL 5 MG PO TABS
5.0000 mg | ORAL_TABLET | ORAL | Status: AC | PRN
Start: 2024-02-23 — End: ?
  Administered 2024-02-25: 5 mg via ORAL
  Filled 2024-02-23 (×2): qty 1

## 2024-02-23 MED ORDER — TAMSULOSIN HCL 0.4 MG PO CAPS
0.4000 mg | ORAL_CAPSULE | Freq: Every day | ORAL | Status: DC
Start: 2024-02-23 — End: 2024-02-27
  Administered 2024-02-23 – 2024-02-27 (×5): 0.4 mg via ORAL
  Filled 2024-02-23 (×5): qty 1

## 2024-02-23 MED ORDER — EPHEDRINE SULFATE 50 MG/ML IJ/IV SOLN (WRAP)
Status: DC | PRN
Start: 2024-02-23 — End: 2024-02-23
  Administered 2024-02-23: 5 mg via INTRAVENOUS
  Administered 2024-02-23: 10 mg via INTRAVENOUS
  Administered 2024-02-23: 5 mg via INTRAVENOUS

## 2024-02-23 MED ORDER — ACETAMINOPHEN 325 MG PO TABS
650.0000 mg | ORAL_TABLET | Freq: Once | ORAL | Status: DC | PRN
Start: 2024-02-23 — End: 2024-02-23

## 2024-02-23 SURGICAL SUPPLY — 125 items
ADHESIVE LIQUID WATERPROOF VIAL PREP NONSTAIN MASTISOL STYRAX GUM (Skin Closure) ×2 IMPLANT
ADHESIVE LQ STYRAX GUM MASTIC ALC MTHY (Skin Closure) ×2 IMPLANT
ADHESIVE SKIN CLOSURE DERMABOND ADVANCED (Skin Closure) ×2 IMPLANT
ADHESIVE SKIN CLOSURE DERMABOND ADVANCED .7 ML LIQUID APPLICATOR (Skin Closure) ×2 IMPLANT
APPLICATOR CHLORAPREP 26 ML 70% ISOPROPYL ALCOHOL 2% CHLORHEXIDINE (Applicator) ×4 IMPLANT
APPLICATOR PRP 70% ISPRP 2% CHG 26ML (Applicator) ×2 IMPLANT
APPLICATOR PRP 70% ISPRP 2% CHG 26ML BD (Applicator) ×2 IMPLANT
APPLIER INTERNAL CLIP L9.75 IN AUTOMATIC (Procedure Accessories) IMPLANT
APPLIER INTERNAL CLIP L9.75 IN AUTOMATIC PREMIUM SURGICLIP II SUPER (Procedure Accessories) IMPLANT
BARRIER ADH SOD HALUR CMC SPRFLM 6X5IN (Sheet) ×2 IMPLANT
BARRIER ADHESION L6 IN X W5 IN SEPRAFILM SODIUM HYALURONATE (Sheet) ×2 IMPLANT
CLIP INTERNAL MEDIUM CHEVRON 6 CARTRIDGE (Clips) IMPLANT
CLIP INTERNAL MEDIUM CHEVRON 6 CARTRIDGE LIGATE TRIANGULATE CROSS (Clips) IMPLANT
CLIP INTERNAL MEDIUM LARGE CHEVRON 6 CARTRIDGE LIGATE TRIANGULATE (Clips) IMPLANT
CLIP INTERNAL MEDIUM/LARGE CHEVRON (Clips) IMPLANT
CORD ELECTROSURGICAL L300 CM UNIPOLAR (Cable) ×2 IMPLANT
CORD ELECTROSURGICAL L300 CM UNIPOLAR HIGH FREQUENCY OD8 MM AUTOCON (Cable) ×2 IMPLANT
DISSECTOR ENDOSCOPIC L38 CM 1 TIP RADIOPAQUE BRUSH FINISH BLUNT OD5 MM (Sponge) ×2 IMPLANT
DISSECTOR ESCP SS KTNR 5MM 38CM LF STRL (Sponge) ×2 IMPLANT
DISSECTOR LAPAROSCOPIC L.56 IN X W.25 IN (Instrument) IMPLANT
DISSECTOR LAPAROSCOPIC L.56 IN X W.25 IN C5 HOLDER SPONGE XRAY (Instrument) IMPLANT
DRAPE SURGICAL SHEET L77 IN X W53 IN (Drape) ×2 IMPLANT
DRAPE SURGICAL SHEET L77 IN X W53 IN MEDLINE SMS 3/4 BLUE (Drape) ×2 IMPLANT
ELECTRODE ADULT PATIENT RETURN L9 FT REM POLYHESIVE ACRYLIC FOAM (Procedure Accessories) ×2 IMPLANT
ELECTRODE ELECTROSURGICAL BLADE L6.5 IN (Cautery) ×2 IMPLANT
ELECTRODE ELECTROSURGICAL BLADE L6.5 IN OD3/32 IN VALLEYLAB STAINLESS (Cautery) ×2 IMPLANT
ELECTRODE PATIENT RETURN L9 FT VALLEYLAB (Procedure Accessories) ×2 IMPLANT
GLOVE SURGICAL 7 1/2 BIOGEL PI INDICATOR (Glove) ×4 IMPLANT
GLOVE SURGICAL 7 1/2 BIOGEL PI INDICATOR UNDERGLOVE POWDER FREE SMOOTH (Glove) ×4 IMPLANT
GLOVE SURGICAL 7 BIOGEL PI INDICATOR (Glove) ×2 IMPLANT
GLOVE SURGICAL 7 BIOGEL PI INDICATOR UNDERGLOVE POWDER FREE SMOOTH (Glove) ×2 IMPLANT
GLOVE SURGICAL 7.5 BIOGEL PI POWDER FREE (Glove) ×2 IMPLANT
GLOVE SURGICAL 7.5 BIOGEL PI POWDER FREE MICRO ROUGHENED BEAD CUFF (Glove) ×2 IMPLANT
GLOVE SURGICAL 8 BIOGEL PI INDICATOR (Glove) ×2 IMPLANT
GLOVE SURGICAL 8 BIOGEL PI INDICATOR UNDERGLOVE POWDER FREE SMOOTH (Glove) ×2 IMPLANT
GOWN SURGICAL XL SMARTGOWN LEVEL 4 (Gown) ×2 IMPLANT
GOWN SURGICAL XL SMARTGOWN LEVEL 4 BREATHABLE (Gown) ×2 IMPLANT
HANDPIECE SCT PL LF STRL DISP (Procedure Accessories) ×2 IMPLANT
HANDPIECE SUCTION POOLE SUMP ACTION PINPOINT SUCTION MEDI- VAC (Procedure Accessories) ×2 IMPLANT
IRRIGATOR SUCTION TIP STRYKEFLOW 2 (Respiratory Supplies) IMPLANT
KIT RM TURNOVER LF NS DISP (Kits) ×2 IMPLANT
KIT ROOM TURNOVER NONSTERILE LATEX FREE DISPOSABLE (Kits) ×2 IMPLANT
KIT SURGICAL BASIN 2 FOAK (Procedure Accessories) ×2 IMPLANT
KIT SURGICAL BASIN 2 FOAK MEDLINE INDUSTRIES, INC. (Procedure Accessories) ×2 IMPLANT
MLTGNT CANNABNOID 500T (Lab Supplies) ×2 IMPLANT
NEEDLE INSUFFLATION L120 MM OD14 GA (Needles) ×2 IMPLANT
NEEDLE INSUFFLATION L120 MM OD14 GA ENDOPATHÂ® SPRING LOAD BLUNT STYLET (Needles) ×2 IMPLANT
NEEDLE INSUFFLATION L150 MM OD14 GA (Needles) IMPLANT
NEEDLE INSUFFLATION L150 MM OD14 GA ENDOPATHÂ® SPRING LOAD BLUNT STYLET (Needles) IMPLANT
PACK SURGICAL GENERAL LAP SHARE (Tray) ×2 IMPLANT
PACK SURGICAL GENERAL LAP SHARE MEDLINE INDUSTRIES, INC (Tray) ×2 IMPLANT
PAD TRENDELENBURG LARGE ARM PROTECTOR (Positioning Supplies) ×2 IMPLANT
PAD TRENDELENBURG LARGE ARM PROTECTOR POSITIONING 90912 TRENDELENBURG (Positioning Supplies) ×2 IMPLANT
PENCIL SMOKE EVACUATOR COATED PUSH (Cautery) ×2 IMPLANT
PENCIL SMOKE EVACUATOR COATED PUSH BUTTON NEPTUNE E-SEP (Cautery) ×2 IMPLANT
POUCH SPECIMEN RETRIEVAL L34.5 CM (Laparoscopy Supplies) IMPLANT
POUCH SPECIMEN RETRIEVAL L34.5 CM ERGONOMIC HANDLE LONG CYLINDRICAL (Laparoscopy Supplies) IMPLANT
PROTECTOR TISSUE MEDIUM FLEXBLE (Retractor) ×2 IMPLANT
REAGENT CANNABINOIDS AEROSET MULTIGENT DOA C8000 C4000 ANALYZER (Lab Supplies) ×2 IMPLANT
RELOAD STAPLER 2 MM 2.5 MM 3 MM L45 MM (Staplers) IMPLANT
RELOAD STAPLER 2 MM 2.5 MM 3 MM L45 MM ENDO GIA TITANIUM MEDIUM (Staplers) IMPLANT
RELOAD STAPLER 3 MM 3.5 MM 4 MM L45 MM (Staplers) IMPLANT
RELOAD STAPLER 3 MM 3.5 MM 4 MM L45 MM ENDO GIA TITANIUM MEDIUM THICK (Staplers) IMPLANT
RELOAD STAPLER 3 MM 3.5 MM 4 MM L60 MM (Staplers) IMPLANT
RELOAD STAPLER 3 MM 3.5 MM 4 MM L60 MM ENDO GIA TITANIUM MEDIUM THICK (Staplers) IMPLANT
RELOAD STAPLER L80 MM X H4.8 MM GIA (Staplers) ×2 IMPLANT
RELOAD STAPLER L80 MM X H4.8 MM GIA TITANIUM THICK TISSUE 4 ROW LINEAR (Staplers) ×2 IMPLANT
RETRACTOR TISSUE MEDIUM FLEXIBLE RETRACTION RING ATRAUMATIC SELF (Retractor) ×2 IMPLANT
SEALER/DIVIDER ELECTROSURGICAL L18 CM 14 D 180 D L34 MM CURVE JAW OVAL (Instrument) ×2 IMPLANT
SEALER/DIVIDER ESURG 14D 180D 34MM LGSR (Instrument) ×2 IMPLANT
SEALER/DIVIDER LAPAROSCOPIC L37 CM 350 D (Laparoscopy Supplies) ×2 IMPLANT
SEALER/DIVIDER LAPAROSCOPIC L37 CM 350 D L18.5 MM MARYLAND CURVE JAW (Laparoscopy Supplies) ×2 IMPLANT
SET HIGH FLOW SMOKE EVACUATION (Tubing) ×2 IMPLANT
SET HIGH FLOW SMOKE EVACUATION PNEUMOCLEAR TUBING (Tubing) ×2 IMPLANT
SLEEVE LAPSCP UNV EPTH XCL 5MM 100MM (Procedure Accessories) ×4 IMPLANT
SLEEVE TROCAR L100 MM UNIVERSAL STABILITY OD5 MM ENDOPATH XCEL (Procedure Accessories) ×4 IMPLANT
SOLUTION IRRIGATION 0.9% SODIUM CHLORIDE (Irrigation Solutions) ×2 IMPLANT
SOLUTION IRRIGATION 0.9% SODIUM CHLORIDE 1000 ML PLASTIC POUR BOTTLE (Irrigation Solutions) ×2 IMPLANT
SOLUTION SURGICAL PREP 26 ML DURAPREP (Prep) ×2 IMPLANT
SOLUTION SURGICAL PREP 26 ML DURAPREP 74% ISOPROPYL ALCOHOL 0.7% (Prep) ×2 IMPLANT
SPONGE GAUZE L4 IN X W4 IN 12 PLY WOVEN (Dressing) ×2 IMPLANT
SPONGE GAUZE L4 IN X W4 IN 12 PLY WOVEN FOLD EDGE XRAY DETECT COTTON (Dressing) ×2 IMPLANT
SPONGE LAPAROTOMY L18 IN X W18 IN (Sponge) ×2 IMPLANT
SPONGE LAPAROTOMY L18 IN X W18 IN PREWASH WHITE (Sponge) ×2 IMPLANT
STAPLER REGULAR TISSUE L60 MM X H3.8 MM (Staplers) IMPLANT
STAPLER REGULAR TISSUE L60 MM X H3.8 MM 4 ROW LINEAR CUTTER QUICK (Staplers) IMPLANT
STAPLER REGULAR TISSUE L60 MM X H4.8 MM (Staplers) ×2 IMPLANT
STAPLER REGULAR TISSUE L60 MM X H4.8 MM ENDOSCOPIC 2 ROW LINEAR CUTTER (Staplers) ×2 IMPLANT
STAPLER SKIN W4.8 MM X H3.4 MM 35 WIDE (Staplers) IMPLANT
STAPLER THIN VASCULAR TISSUE L6 CM X W4 (Staplers) IMPLANT
STAPLER THIN VASCULAR TISSUE L6 CM X W4 MM OD12 MM ENDOSCOPIC (Staplers) IMPLANT
STAPLER TISSUE L16 CM X W4 MM OD12 MM (Staplers) ×2 IMPLANT
STAPLER TISSUE L16 CM X W4 MM OD12 MM ENDOSCOPIC ENDO GIA PVC INTERNAL (Staplers) ×2 IMPLANT
SUTURE COATED VICRYL 2-0 SH L27 IN BRAID (Suture) ×2 IMPLANT
SUTURE COATED VICRYL 2-0 SH L27 IN BRAID COATED UNDYED ABSORBABLE (Suture) ×2 IMPLANT
SUTURE COATED VICRYL 3-0 SH L18 IN (Suture) ×2 IMPLANT
SUTURE COATED VICRYL 3-0 SH L18 IN CONTROL BRD 8 STRAND UNDYED ABSRBBL (Suture) ×2 IMPLANT
SUTURE COATED VICRYL PLUS 0 L18 IN BRAID (Suture) ×2 IMPLANT
SUTURE COATED VICRYL PLUS 0 L18 IN BRAID 6 STRAND ANTIBACTERIAL COATED (Suture) ×2 IMPLANT
SUTURE COATED VICRYL PLUS 0 UR-6 L27 IN (Suture) ×2 IMPLANT
SUTURE COATED VICRYL PLUS 0 UR-6 L27 IN BRAID ANTIBACTERIAL COATED (Suture) ×2 IMPLANT
SUTURE COATED VICRYL PLUS 3-0 SH L27 IN (Suture) ×2 IMPLANT
SUTURE COATED VICRYL PLUS 3-0 SH L27 IN BRAID ANTIBACTERIAL COATED (Suture) ×2 IMPLANT
SUTURE MONOCRYL 4-0 PS-2 L27 IN (Suture) IMPLANT
SUTURE MONOCRYL 4-0 PS-2 L27 IN MONOFILAMENT UNDYED ABSORBABLE (Suture) IMPLANT
SUTURE PASSER WECK EFX CLASSIC (Procedure Accessories) ×2 IMPLANT
SUTURE PDS II 1 TP-1 L96 IN MONOFILAMENT (Suture) ×2 IMPLANT
SUTURE PDS II 1 TP-1 L96 IN MONOFILAMENT LOOP VIOLET ABSORBABLE (Suture) ×2 IMPLANT
SYSTEM IMAGING 8X6IN CLEARIFY MICROFIBER WARM HUB TRCR WIPE DSPSBL (Kits) IMPLANT
SYSTEM IMG MRFBR CLEARIFY 8X6IN WRM HUB (Kits) IMPLANT
TOWEL L27 IN X W17 IN COTTON STANDARD (Procedure Accessories) ×2 IMPLANT
TOWEL OR L27 IN X W17 IN STANDARD PREWASH DELINT ABSORBENT COTTON BLUE (Procedure Accessories) ×2 IMPLANT
TRAY CATH FOLEY SILVER 14F 5CC (Catheter Kits) ×2 IMPLANT
TRAY CATHETER SNAPSECURE 1 LAYER 14FR FOLEY (Catheter Urine) IMPLANT
TRAY CATHETER SNAPSECURE 1 LAYER FOLEY 14FR 10ML (Catheter Urine) IMPLANT
TRAY CATHETERIZATION OD14 FR FOLEY LUER LOCK SAMPLE PORT ENCLOSED (Catheter Kits) ×2 IMPLANT
TRAY SURGICAL LAPAROTOMY (Pack) ×2 IMPLANT
TROCAR LAPAROSCOPIC STABILITY SLEEVE BLADELESS OBTURATOR L100 MM OD11 (Laparoscopy Supplies) ×2 IMPLANT
TROCAR LAPSCP EPTH XCL 11MM 100MM LF (Laparoscopy Supplies) ×2 IMPLANT
TROCAR LAPSCP EPTH XCL 5MM 100MM LF STRL (Laparoscopy Supplies) ×2 IMPLANT
TROCAR OD5 MM L100 MM BLADELESS STABLE SLEEVE ENDOPATH XCEL ENDOSCOPIC (Laparoscopy Supplies) ×2 IMPLANT
WASHCLOTH CLEANSING L8 IN X W8 IN PH (Procedure Accessories) ×2 IMPLANT
WASHCLOTH CLEANSING L8 IN X W8 IN PH BALANCE NEEDLE PUNCH (Procedure Accessories) ×2 IMPLANT
WATER STERILE PLASTIC POUR BOTTLE 1000 (Irrigation Solutions) ×2 IMPLANT
WATER STERILE PLASTIC POUR BOTTLE 1000 ML (Irrigation Solutions) ×2 IMPLANT

## 2024-02-23 NOTE — Progress Notes (Signed)
 Pt arrived at [address] at around 0627H, Received a call from PACU at [phone] that pt needs to be transferred to OR now and transport is on it's way. VS as follows: BP 157/88 HR 103 Temp 97.5 SPO2 96% on O2 via NC at 2L. Home clothes removed and pt's wedding ring handed over to pt's daughter. CHG bath done. Pt left the unit at 0650H.

## 2024-02-23 NOTE — ED Provider Notes (Addendum)
 EMERGENCY DEPARTMENT HISTORY AND PHYSICAL EXAM      Patient Name: Roy Delgado.  Age: 88 y.o. male  Encounter Date:  02/23/2024  Department:[facility]  Patient Room: GR7/GR7  PCP: Celestina Lucie PARAS, MD     History of Presenting Illness     Chief Complaint   Patient presents with    Nausea    Emesis       Roy Delgado Daryan Buell. is a 88 y.o. male who  has a past medical history of Diabetes, Glaucoma, and Hypertension. (per Epic records) who presents to the emergency department today for evaluation of nausea, vomiting, generalized abdominal pain, weakness and fatigue.  The patient states that he was recently exposed to influenza, and has been feeling somewhat weak and fatigued today.  A few hours prior to arrival, he started having multiple episodes of nonbloody nonbilious emesis, now with generalized abdominal pain, more prominent in his left upper quadrant.  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  Denies any chest pain or shortness of breath.  On EMS arrival, patient was saturating 88% on room air, placed on 2 L nasal cannula.      I reviewed the patient's last ED visit, clinic visit or admission/discharge summary, as well as associated recent EKGs, lab or imaging results, if applicable.   __________________________________________________________________  History obtained from: Patient and EMS  History was obtained from a source other than the patient: Yes. Why: To obtain information in addition to that relayed by the patient.    History was obtained in the following language: English . History was obtained with use of interpreter: No.     PCP: Celestina Lucie PARAS, MD    Past History     I have reviewed Past Medical History, Surgical History, Family History, Social History, Allergies, Current Medications, and Social Determinants of Health as documented  below.      Past Medical History:  Medical History[1]    Past Surgical History:  Past Surgical History[2]    Family History:  Family History[3]     Social History:  Social History[4]    Allergies:  Allergies[5]    Current Medications:   Home Medications       Med List Status: In Progress Set By: Eloisa Pol, RN at 02/23/2024  2:18 AM              Brimonidine Tartrate-Timolol  (COMBIGAN OP)     Apply 1 drop to eye 2 (two) times daily.     hydrochlorothiazide  (HYDRODIURIL ) 25 MG tablet     Take 25 mg by mouth daily.     insulin  glargine (LANTUS ) 100 UNIT/ML injection     Inject 27 Units into the skin daily     Insulin  Lispro (HUMALOG  SC)     Inject 15 Units into the skin 3 (three) times daily.     levocetirizine (XYZAL) 2.5 MG/5ML solution     Take 5 mLs (2.5 mg) by mouth every evening     metoprolol  succinate XL (TOPROL -XL) 25 MG 24 hr tablet     Take 1 tablet (25 mg) by mouth daily     montelukast  (SINGULAIR ) 10 MG tablet     Take 1 tablet (10 mg) by mouth nightly     olmesartan (BENICAR) 40 MG tablet     Take 40 mg by mouth daily.  pregabalin  (LYRICA ) 100 MG capsule     Take 1 capsule (100 mg) by mouth daily             Social Determinants of Health:   None of the above  How the Social Determinant of Health contributed to care: Social Determinant Contributing to Care: N/A    Review of Systems     Review of systems negative except as per HPI       Physical Exam   BP: 113/67, Heart Rate: (!) 104, Temp: 97.3 F (36.3 C), Resp Rate: 18, SpO2: (!) 89 %, Weight: 79.4 kg     Physical Exam  Vitals and nursing note reviewed.   Constitutional:       General: He is not in acute distress.     Appearance: He is well-developed. He is ill-appearing. He is not toxic-appearing or diaphoretic.   HENT:      Head: Normocephalic and atraumatic.   Eyes:      Extraocular Movements: Extraocular movements intact.   Cardiovascular:      Rate and Rhythm: Normal rate and regular rhythm.      Heart sounds:      No diastolic murmur is  present.      No friction rub.   Pulmonary:      Effort: Pulmonary effort is normal. No tachypnea or respiratory distress.      Breath sounds: Normal breath sounds.   Abdominal:      General: Abdomen is flat.      Palpations: Abdomen is soft.      Tenderness: There is abdominal tenderness in the epigastric area and left upper quadrant. There is no right CVA tenderness, left CVA tenderness, guarding or rebound. Negative signs include Murphy's sign and McBurney's sign.   Musculoskeletal:         General: Normal range of motion.      Cervical back: Normal range of motion.   Skin:     General: Skin is warm and dry.      Capillary Refill: Capillary refill takes less than 2 seconds.   Neurological:      General: No focal deficit present.      Mental Status: He is alert and oriented to person, place, and time.   Psychiatric:         Mood and Affect: Mood normal.         Behavior: Behavior normal.         Diagnostic Study Results     Labs -     Results       Procedure Component Value Units Date/Time    COVID-19 and Influenza ([place]) (symptomatic) [[id number]]  (Abnormal) Collected: 02/23/24 0325    Specimen: Swab from Anterior Nares Updated: 02/23/24 0401     SARS-CoV-2 (COVID-19) RNA Not Detected     Influenza A RNA Detected     Influenza B RNA Not Detected    Narrative:      A result of Detected indicates POSITIVE for the presence of viral RNA  A result of Not Detected indicates NEGATIVE for the presence of viral RNA    Test performed using the Roche cobas [place] SARS-CoV-2 & Influenza A/B assay. This is a multiplex real-time RT-PCR assay for the detection of SARS-CoV-2, influenza A, and influenza B virus RNA. Viral nucleic acids may persist in vivo, independent of viability. Detection of viral nucleic acid does not imply the presence of infectious virus, or that virus nucleic acid is the cause of clinical  symptoms. Negative results do not preclude SARS-CoV-2, influenza A, and/or influenza B infection and should not be  used as the sole basis for diagnosis, treatment or other patient management decisions. Invalid results may be due to inhibiting substances in the specimen and recollection should occur.     High Sensitivity Troponin-I [[id number]]  (Normal) Collected: 02/23/24 0246    Specimen: Blood, Venous Updated: 02/23/24 0319     hs Troponin 10.4 ng/L     Lipase [[id number]]  (Normal) Collected: 02/23/24 0246    Specimen: Blood, Venous Updated: 02/23/24 0315     Lipase 19 U/L     Comprehensive Metabolic Panel [[id number]]  (Abnormal) Collected: 02/23/24 0246    Specimen: Blood, Venous Updated: 02/23/24 0310     Glucose 157 mg/dL      BUN 27 mg/dL      Creatinine 1.3 mg/dL      Sodium 860 mEq/L      Potassium 5.3 mEq/L      Chloride 103 mEq/L      CO2 25 mEq/L      Calcium 9.4 mg/dL      Anion Gap 88.9     GFR 53.8 mL/min/1.73 m2      AST (SGOT) 25 U/L      ALT 12 U/L      Alkaline Phosphatase 162 U/L      Albumin  4.0 g/dL      Protein, Total 8.1 g/dL      Globulin 4.1 g/dL      Albumin /Globulin Ratio 1.0     Bilirubin, Total 0.5 mg/dL     Lactic Acid [[id number]]  (Normal) Collected: 02/23/24 0246    Specimen: Blood, Venous Updated: 02/23/24 0300     Whole Blood Lactic Acid 1.8 mmol/L     CBC with Differential (Order) [[id number]]  (Abnormal) Collected: 02/23/24 0246    Specimen: Blood, Venous Updated: 02/23/24 0256    Narrative:      The following orders were created for panel order CBC with Differential (Order).  Procedure                               Abnormality         Status                     ---------                               -----------         ------                     CBC with Differential (...[[id number]]  Abnormal            Final result                 Please view results for these tests on the individual orders.    CBC with Differential (Component) [[id number]]  (Abnormal) Collected: 02/23/24 0246    Specimen: Blood, Venous Updated: 02/23/24 0256     WBC 6.04 x10 3/uL      Hemoglobin 13.2 g/dL       Hematocrit 59.3 %      Platelet Count 351 x10 3/uL      MPV 10.9 fL      RBC 4.55 x10 6/uL      MCV 89.2 fL  MCH 29.0 pg      MCHC 32.5 g/dL      RDW 15 %      nRBC % 0.0 /100 WBC      Absolute nRBC 0.00 x10 3/uL      Preliminary Absolute Neutrophil Count 4.47 x10 3/uL      Neutrophils % 73.9 %      Lymphocytes % 13.4 %      Monocytes % 7.0 %      Eosinophils % 4.5 %      Basophils % 0.7 %      Immature Granulocytes % 0.5 %      Absolute Neutrophils 4.47 x10 3/uL      Absolute Lymphocytes 0.81 x10 3/uL      Absolute Monocytes 0.42 x10 3/uL      Absolute Eosinophils 0.27 x10 3/uL      Absolute Basophils 0.04 x10 3/uL      Absolute Immature Granulocytes 0.03 x10 3/uL     Culture, Blood, Aerobic And Anaerobic [[id number]] Collected: 02/23/24 0246    Specimen: Blood, Venous Updated: 02/23/24 0252            Radiologic Studies -   Radiology Results (24 Hour)       Procedure Component Value Units Date/Time    Chest AP Portable [[id number]] Resulted: 02/23/24 0536    Order Status: Sent Updated: 02/23/24 0613    CT Abd/Pelvis with IV Contrast [id number]] Collected: 02/23/24 0430    Order Status: Completed Updated: 02/23/24 0445    Narrative:      HISTORY: History of volvulus. Worsening abdominal pain.    COMPARISON: CT abdomen/pelvis 01/13/2024    TECHNIQUE: CT of the abdomen and pelvis performed with intravenous  contrast. The following dose reduction techniques were utilized: automated  exposure control and/or adjustment of the mA and/or KV according to patient  size, and the use of an iterative reconstruction technique.    CONTRAST: iohexol  (OMNIPAQUE ) 350 MG/ML injection 100 mL    FINDINGS:  Lung bases: Chronic interstitial thickening probably chronic changes of the  bilateral lung bases. Elevation of the left hemidiaphragm with left basilar  atelectasis.    Hepatobiliary: Stable small cyst right hepatic lobe. The gallbladder is  distended and without evidence of radiopaque stone or wall thickening.  No  intrahepatic or extrahepatic biliary dilatation.    Spleen: Normal.  Adrenal Glands: Normal.  Pancreas: Normal.    Renal: Bilateral kidneys are atrophic and notable for chronic multifocal  renal scarring. Multiple nonobstructing stones in the left kidney. No  hydronephrosis. No nephroureteral stone or obstructive uropathy. Visualized  bladder is unremarkable.    Gastrointestinal: The stomach and duodenum are fluid-filled and dilated.  There is a transition point in the proximal jejunum. Swirling/rotation of  the mesenteric root is again noted suggestive of midgut volvulus. There are  also a few mildly dilated fluid-filled loops of distal jejunum. No evidence  of acute appendicitis.    Mesentery/Other: No free fluid or free air. No pathologic adenopathy.  Vascular structures are within normal limits. No abdominal aortic aneurysm.    Bones/Soft Tissues: No osseous lesion or fracture. Extensive degenerative  changes in the lower lumbar spine.        Impression:        1.Persistent dilation of the stomach, duodenum, and proximal jejunum with  findings suggestive of midgut volvulus.   2.Chronic renal atrophy and scarring.  3.Nonobstructing left renal stones.    Justus Radford, MD  02/23/2024 4:43 AM  XR Chest  AP Portable [[id number]] Collected: 02/23/24 0329    Order Status: Completed Updated: 02/23/24 0333    Narrative:      HISTORY: Hypoxia, nausea, vomiting.      COMPARISON: XR CHEST 2025-Jan-20    FINDINGS:   Previously seen enteric catheter has been removed. Mild cardiomegaly is  unchanged. Atherosclerotic calcification in the aortic arch.    Diffuse prominence of the central vascular and interstitial lung markings.  No lobar airspace consolidation. Elevation of the left hemidiaphragm with  left basilar atelectasis.  The stomach remains enlarged and opacified with  gas. Osseous structures are demineralized.      Impression:        1.Mild cardiomegaly with diffuse prominence of the central vascular and  interstitial  lung markings, suggestive of pulmonary congestion.  2.Elevation of the left hemidiaphragm with left basilar atelectasis.  3.Stomach remains enlarged and opacified with gas.    Justus Radford, MD  02/23/2024 3:31 AM        .      Medical Decision Making     I reviewed the vital signs, available nursing notes, past medical history, past surgical history, family history, social history, and this visit's labs and radiology listed above.    Medications given in the ED:  ED Medication Orders (From admission, onward)      Start Ordered     Status Ordering Provider    02/23/24 2200 02/23/24 0542  montelukast  (SINGULAIR ) tablet 10 mg  At bedtime        Route: Oral  Ordered Dose: 10 mg       Ordered ZHANG, KEVEN    02/23/24 2200 02/23/24 0544  insulin  lispro injection 1-3 Units  At bedtime        Route: Subcutaneous  Ordered Dose: 1-3 Units      Placed in And Linked Group    Rosa MANA, KEVEN    02/23/24 0900 02/23/24 0541  enoxaparin  (LOVENOX ) syringe 40 mg  Daily        Route: Subcutaneous  Ordered Dose: 40 mg       Ordered ZHANG, KEVEN    02/23/24 0900 02/23/24 0542    Every 12 hours scheduled        Route: Oral  Ordered Dose: 75 mg       Discontinued ZHANG, KEVEN    02/23/24 0900 02/23/24 0542  hydroCHLOROthiazide  (HYDRODIURIL ) tablet 25 mg  Daily        Route: Oral  Ordered Dose: 25 mg       Ordered ZHANG, KEVEN    02/23/24 0900 02/23/24 0542  insulin  glargine (LANTUS ) injection 20 Units  Daily        Route: Subcutaneous  Ordered Dose: 20 Units       Ordered ZHANG, KEVEN    02/23/24 0900 02/23/24 0542  metoprolol  succinate XL (TOPROL -XL) 24 hr tablet 25 mg  Daily        Route: Oral  Ordered Dose: 25 mg       Ordered ZHANG, KEVEN    02/23/24 0900 02/23/24 0542  valsartan  (DIOVAN ) tablet 320 mg  Daily        Route: Oral  Ordered Dose: 320 mg       Ordered ZHANG, KEVEN    02/23/24 0900 02/23/24 0542  pregabalin  (LYRICA ) capsule 100 mg  Daily        Route: Oral  Ordered Dose: 100 mg       Ordered ZHANG, KEVEN    02/23/24  0900  02/23/24 0545  oseltamivir  (TAMIFLU ) capsule 30 mg  Every 12 hours scheduled        Route: Oral  Ordered Dose: 30 mg       Ordered ZHANG, KEVEN    02/23/24 0745 02/23/24 0544  insulin  lispro injection 1-5 Units  3 times daily before meals        Route: Subcutaneous  Ordered Dose: 1-5 Units      Placed in And Linked Group    Rosa MANA, KEVEN    02/23/24 0545 02/23/24 0544  0.45% NaCl infusion  Continuous        Route: Intravenous       Rosa MANA, KEVEN    02/23/24 0541 02/23/24 0541  dextrose  (GLUCOSE) 40 % oral gel 15 g of glucose  As needed        Route: Oral  Ordered Dose: 15 g of glucose      Placed in Or [employer], KEVEN    02/23/24 0541 02/23/24 0541  dextrose  (D10W) 10% bolus 125 mL  As needed        Route: Intravenous  Ordered Dose: 12.5 g      Placed in Or Linked Group    Ordered ZHANG, KEVEN    02/23/24 0541 02/23/24 0541  dextrose  50 % bolus 12.5 g  As needed        Route: Intravenous  Ordered Dose: 12.5 g      Placed in Or Linked Group    Ordered ZHANG, KEVEN    02/23/24 0541 02/23/24 0541  glucagon  (rDNA) (GLUCAGEN) injection 1 mg  As needed        Route: Intramuscular  Ordered Dose: 1 mg      Placed in Or Linked Group    Ordered ZHANG, KEVEN    02/23/24 0541 02/23/24 0541  ondansetron  (ZOFRAN -ODT) disintegrating tablet 4 mg  Every 6 hours PRN        Route: Oral  Ordered Dose: 4 mg      Placed in Or Linked Group    Ordered ZHANG, KEVEN    02/23/24 0541 02/23/24 0541  ondansetron  (ZOFRAN ) injection 4 mg  Every 6 hours PRN        Route: Intravenous  Ordered Dose: 4 mg      Placed in Or Linked Group    Ordered ZHANG, KEVEN    02/23/24 0541 02/23/24 0541  acetaminophen  (TYLENOL ) tablet 650 mg  Every 4 hours PRN        Route: Oral  Ordered Dose: 650 mg       Ordered ZHANG, KEVEN    02/23/24 0541 02/23/24 0541  polyethylene glycol (MIRALAX ) packet 17 g  Daily PRN        Route: Oral  Ordered Dose: 17 g       Ordered ZHANG, KEVEN    02/23/24 0541 02/23/24 0541  melatonin  tablet 3 mg  At bedtime PRN        Route: Oral  Ordered Dose: 3 mg       Ordered ZHANG, KEVEN    02/23/24 0541 02/23/24 0541  saline (OCEAN NASAL SPRAY) 0.65 % nasal solution 2 spray  Every 4 hours PRN        Route: Each Nare  Ordered Dose: 2 spray       Rosa MANA, KEVEN    02/23/24 0541 02/23/24 0541  carboxymethylcellulose (REFRESH TEARS) 0.5 % ophthalmic solution 1 drop  3 times daily PRN  Route: Both Eyes  Ordered Dose: 1 drop       Rosa MANA, KEVEN    02/23/24 0541 02/23/24 0541  benzocaine -menthol  (CEPACOL/CHLORASEPTIC) lozenge 1 lozenge  Every 2 hours PRN        Route: Buccal  Ordered Dose: 1 lozenge       Ordered ZHANG, KEVEN    02/23/24 0541 02/23/24 0541  benzonatate  (TESSALON ) capsule 100 mg  3 times daily PRN        Route: Oral  Ordered Dose: 100 mg       Ordered ZHANG, KEVEN    02/23/24 0541 02/23/24 0541  magnesium  sulfate 1g in dextrose  5% 100mL IVPB (premix)  As needed        Route: Intravenous  Ordered Dose: 1 g       Ordered ZHANG, KEVEN    02/23/24 0541 02/23/24 0541  potassium chloride  (KLOR-CON  M20) CR tablet 0-60 mEq  As needed        Route: Oral  Ordered Dose: 0-60 mEq      Placed in Or Linked Group    Ordered ZHANG, KEVEN    02/23/24 0541 02/23/24 0541  potassium chloride  (KLOR-CON ) packet 0-60 mEq  As needed        Route: Oral  Ordered Dose: 0-60 mEq      Placed in Or Linked Group    Ordered ZHANG, ALBERT    02/23/24 0541 02/23/24 0541  potassium chloride  10 mEq in 100 mL IVPB (premix)  As needed        Route: Intravenous  Ordered Dose: 10 mEq      Placed in Or Linked Group    Ordered ZHANG, KEVEN    02/23/24 0541 02/23/24 0541  potassium & sodium phosphates  (PHOS-NAK) 280-160-250 MG packet 2 packet  As needed        Route: Oral  Ordered Dose: 2 packet       Ordered ZHANG, KEVEN    02/23/24 0541 02/23/24 0541  oxyCODONE  (ROXICODONE ) immediate release tablet 5 mg  Every 4 hours PRN        Route: Oral  Ordered Dose: 5 mg       Ordered ZHANG, KEVEN    02/23/24 0541 02/23/24  0541  HYDROmorphone  (DILAUDID ) injection 0.2 mg  Every 4 hours PRN        Route: Intravenous  Ordered Dose: 0.2 mg       Rosa MANA, KEVEN    02/23/24 0541 02/23/24 0541  naloxone  (NARCAN ) injection 0.2 mg  As needed        Route: Intravenous  Ordered Dose: 0.2 mg       Ordered ZHANG, KEVEN    02/23/24 0504 02/23/24 0504  lidocaine  2% urethral/mucosal jelly  once PRN        Route: Topical       Last MAR action: Given HARTMAN, BRITTANY    02/23/24 0429 02/23/24 0429  iohexol  (OMNIPAQUE ) 350 MG/ML injection 100 mL  IMG once as needed        Route: Intravenous  Ordered Dose: 100 mL       Last MAR action: Imaging Agent Given CHEYENNE GRATE    02/23/24 0228 02/23/24 0227  sodium chloride  0.9 % bolus 1,000 mL  Once        Route: Intravenous  Ordered Dose: 1,000 mL       Last MAR action: Stopped HARTMAN, BRITTANY    02/23/24 0222 02/23/24 0221  morphine  injection 4 mg  Once  Route: Intravenous  Ordered Dose: 4 mg       Last MAR action: Given HARTMAN, BRITTANY    02/23/24 0221 02/23/24 0220  ondansetron  (ZOFRAN ) injection 4 mg  Once        Route: Intravenous  Ordered Dose: 4 mg       Last MAR action: Given HARTMAN, BRITTANY            Medications Prescribed:   Discharge Prescriptions    None         Clinical Decision Support:       Vital Signs-Reviewed the patient's vital signs.   Patient Vitals for the past 12 hrs:   BP Temp Pulse Resp   02/23/24 0600 131/81 -- 89 18   02/23/24 0555 -- -- 86 15   02/23/24 0501 180/86 97.5 F (36.4 C) 68 21   02/23/24 0400 120/80 -- 97 18   02/23/24 0330 -- -- 98 17   02/23/24 0230 -- -- (!) 107 16   02/23/24 0214 113/67 -- (!) 104 18   02/23/24 0212 -- 97.3 F (36.3 C) -- --       Provider Notes:    88 y.o. male presents with nausea, vomiting, abdominal pain, generalized weakness, hypoxia. On examination, patient is noted to be tachycardic, appears somewhat ill, retching into emesis basin at bedside.  Patient is also hypoxic at 89% on room air, placed on 2 L nasal cannula.   Abdomen with left upper quadrant and epigastric tenderness to palpation, no rebound or guarding.    Differential diagnosis includes, but is not limited to small bowel obstruction, ileus, volvulus, influenza, COVID, pneumonia, aspiration pneumonitis, diverticulitis, ileus, enteritis, or other pathology. Evaluation to include CMP, COVID and flu testing, CBC, lipase, lactate, troponin.  Medications and treatment will include Zofran  for nausea, morphine  for pain, and IV fluid bolus.     ---------------------------------------------------------------------------------------------------------------------    Critical Care involved close monitoring, frequent reassessments, extensive medical decision-making and conversations with multiple consultants, as outlined below.    Pulse Oximetry Analysis:  Interpreted by me. 88% on room air - Hypoxic  Cardiac Monitor: Interpreted by me. Rhythm:  Normal Sinus, Rate:  Normal, Ectopy:  None    * Labs: All labs have been ordered, reviewed and interpreted by me. See Provider Notes/Summary section for discussion.  * Xrays: Ordered, reviewed and interpreted by me, confirmed by radiology report. See Provider Notes/Summary section for discussion.   * CT/US /MRI, as applicable: Ordered and reviewed  by me, confirmed by radiology report. See Provider Notes/Summary section for discussion.    CRITICAL CARE: The high probability of sudden, clinically significant deterioration in the patient's condition required the highest level of my preparedness to intervene urgently.    The services I provided to this patient were to treat and/or prevent clinically significant deterioration that could result in: death.  Services included the following: chart data review, reviewing nursing notes and/or old charts, documentation time, consultant collaboration regarding findings and treatment options, medication orders and management, direct patient care, re-evaluations, vital sign assessments and ordering,  interpreting and reviewing diagnostic studies/lab tests.    Aggregate critical care time was 55 minutes, which includes only time during which I was engaged in work directly related to the patient's care, as described above, whether at the bedside or elsewhere in the Emergency Department.  It did not include time spent performing other reported procedures or the services of residents, students, nurses or physician assistants.    Further care as per ED  course.    ED Course:   ED Course as of 02/23/24 0615   Sun Feb 23, 2024   0258 CBC with Differential (Order)(!)  No leukocytosis, anemia, or thrombocytopenia. [BH]   0319 Lipase: 19  No indication of acute pancreatitis [BH]   0320 Whole Blood Lactate: 1.8 [BH]   0320 hs Troponin-I: 10.4 [BH]   0320 Comprehensive Metabolic Panel(!)  No acute electrolyte abnormality, normal creatinine, LFTs within normal limits. [BH]   0333 XR Chest  AP Portable     IMPRESSION:      1.Mild cardiomegaly with diffuse prominence of the central vascular and  interstitial lung markings, suggestive of pulmonary congestion.  2.Elevation of the left hemidiaphragm with left basilar atelectasis.  3.Stomach remains enlarged and opacified with gas.   [BH]   0406 Influenza A RNA(!): Detected [BH]   0455 CT Abd/Pelvis with IV Contrast     IMPRESSION:      1.Persistent dilation of the stomach, duodenum, and proximal jejunum with  findings suggestive of midgut volvulus.   2.Chronic renal atrophy and scarring.  3.Nonobstructing left renal stones.   [BH]   0507 Spoke with Dr. Herb at this time, who agreed to plan of NPO, NG tube placement, and admission for possible surgery in the AM.  [BH]   0528 Accepted inpatient level 4 by Dr. Laurita  [BH]      ED Course User Index  [BH] Cheyenne Grate, MD     Core Measure: SEP-1 documentation    Please select option A (exclude), B (severe sepsis), or C (septic shock) from the drop-down menu below:    A. This case does NOT meet SEP-1 criteria because of one or  more reasons below:    All SIRS, abnl vitals, organ dysfunction are NOT due to severe sepsis or septic shock, but due to alternative cause: Other: Influenza [list cause].       Diagnosis     Clinical Impression:   1. Influenza    2. Midgut volvulus         Disposition:   ED Disposition       ED Disposition   Admit    Condition   --    Date/Time   Sun Feb 23, 2024  5:28 AM    Comment   Admitting Physician: ZHANG, KEVEN [[id number]]   Service:: Medicine [[id number]]   Estimated Length of Stay: > or = to 2 midnights   Tentative Discharge Plan?: Home or Self Care [1]   Does patient need telemetry?: No                   The above diagnostic process was due to medical necessity based on risk stratification of potential harm of patient's presenting complaint.     CHART OWNERSHIP: This note is prepared by Grate Cheyenne, MD. I am the first provider for this patient.    This note was generated by the Epic EMR system and/or Dragon speech recognition and may contain inherent errors or omissions not intended by the user. Grammatical errors, random word insertions, deletions and pronoun errors  are occasional consequences of this technology due to software limitations. Not all errors are caught or corrected. If there are questions or concerns about the content of this note or information contained within the body of this dictation they should be addressed directly with the author for clarification.    Electronically signed by Grate Cheyenne, MD.        Cheyenne Grate, MD  02/23/24 0529         [1]   Past Medical History:  Diagnosis Date    Diabetes     Glaucoma     Hypertension    [2]   Past Surgical History:  Procedure Laterality Date    appendectomy 1991      LAPAROSCOPY, DIAGNOSTIC N/A 01/13/2024    Procedure: LAPAROSCOPY, DIAGNOSTIC, LYSIS OF ADHESIONS;  Surgeon: Soyla Dallas BROCKS, DO;  Location: [facility];  Service: General;  Laterality: N/A;    LAPAROSCOPY, DIAGNOSTIC BARIATRIC  01/13/2024    Procedure: LAPAROSCOPY, REDUCTION OF  VOLVULUS, REPAIR INTERNAL HERNIA;  Surgeon: Soyla Dallas BROCKS, DO;  Location: [facility];  Service: General;;   [3]   Family History  Problem Relation Name Age of Onset    Colon cancer Father     [4]   Social History  Tobacco Use    Smoking status: Never    Smokeless tobacco: Never   Vaping Use    Vaping status: Never Used   Substance Use Topics    Alcohol use: Not Currently     Comment: 2 drinks a month    Drug use: No   [5] No Known Allergies       Cheyenne Grate, MD  02/23/24 [phone]

## 2024-02-23 NOTE — Discharge Instr - AVS First Page (Addendum)
 Reason for your Hospital Admission:  Small bowel obstruction    Instructions for after your discharge:              DISCHARGE INSTRUCTIONS--COLON SURGERY    Patient Instructions after your colon surgery.  Please refer to the complete postop instructions for details.    WHAT TO EXPECT AT YOUR SURGICAL SITE:    Pink/reddish drainage, bruising, swelling/lump at incision(s) may occur and is normal.    CARE FOR THE INCISION:    Skin glue over your incision(s) will dissolve over the next two weeks.  If you have white tapes on the skin (steri-strips), these will fall off over 7-10 days  Shower 24 hours after your surgery.  No tub bathing, or pool/ocean for 1 week.  Use an ice pack every 15-20 min intermittently for the first 48 hrs as needed.  If your incision is open and packed with gauze, refer to the detailed instructions for wound care you have been given.    DIET:    Keep up with your full liquids.  If your appetite has returned, eat what your system can tolerate.   Avoid hard to digest material, such as seeds, nuts, raw vegetables or fruits with pulp, or other high-roughage foods, until your follow up phone visit    ACTIVITY:    If it hurts, don't do it!  Walking & stairs are encouraged.    Avoid strenuous physical activity & lifting until cleared by your surgeon.  You may drive again once you have stopped taking prescription medication for pain.      MEDICATION:    Resume all home medications.  Use over-the-counter pain medications or a prescribed narcotic for your discomfort as needed.    For constipation, an over-the-counter stool softener or laxative may be taken.    WHAT TO LOOK FOR:    Please call our office immediately at [phone] or go to the ER if you develop any of the following:    Excessive drainage, bleeding, redness or swelling at or around the incision  Fever over 101F  Increased abdominal pain  Persistent nausea or vomiting  Difficulty with urination  Difficulty breathing, chest pain or calf  pain    FOLLOW-UP:    We will see you in our office 1-2 weeks after your surgery.  If not already scheduled, please call [phone] to arrange your post op visit.                Patient Instructions after your colon surgery.  Please refer to the complete postop instructions for details.    WHAT TO EXPECT AT YOUR SURGICAL SITE:    Pink/reddish drainage, bruising, swelling/lump at incision(s) may occur and is normal.    CARE FOR THE INCISION:    Skin glue over your incision(s) will dissolve over the next two weeks.  If you have white tapes on the skin (steri-strips), these will fall off over 7-10 days  Shower 24 hours after your surgery.  No tub bathing, or pool/ocean for 1 week.  Use an ice pack every 15-20 min intermittently for the first 48 hrs as needed.  If your incision is open and packed with gauze, refer to the detailed instructions for wound care you have been given.    DIET:    Keep up with your full liquids.  If your appetite has returned, eat what your system can tolerate.   Avoid hard to digest material, such as seeds, nuts, raw vegetables or  fruits with pulp, or other high-roughage foods, until your follow up phone visit    ACTIVITY:    If it hurts, don't do it!  Walking & stairs are encouraged.    Avoid strenuous physical activity & lifting until cleared by your surgeon.  You may drive again once you have stopped taking prescription medication for pain.      MEDICATION:    Resume all home medications.  Use over-the-counter pain medications or a prescribed narcotic for your discomfort as needed.    For constipation, an over-the-counter stool softener or laxative may be taken.    WHAT TO LOOK FOR:    Please call our office immediately at [phone] or go to the ER if you develop any of the following:    Excessive drainage, bleeding, redness or swelling at or around the incision  Fever over 101F  Increased abdominal pain  Persistent nausea or vomiting  Difficulty with urination  Difficulty  breathing, chest pain or calf pain    FOLLOW-UP:    We will see you in our office 1-2 weeks after your surgery.  If not already scheduled, please call [phone] to arrange your post op visit.    If you develop worsening abdominal pain, chest pain or shortness of breath please call your PCP. Follow up with PCP in 1 week.            Home Health Discharge Information    Your doctor has ordered Physical Therapy and Occupational Therapy in-home service(s) for you while you recuperate at home, to assist you in the transition from hospital to home.    The agency that you or your representative chose to provide the service:  Name of Home Health Agency Placement: [facility]]  Phone: [phone]     The above services were set up by:  Murleen Curtistine Meissner, RN (Post Acute Care Coordinator)   Phone: [phone]        IF YOU HAVE NOT HEARD FROM YOUR HOME HEALTH AGENCY WITHIN 24-48 HOURS AFTER DISCHARGE PLEASE CALL YOUR AGENCY TO ARRANGE A TIME FOR YOUR FIRST VISIT. FOR ANY SCHEDULING CONCERNS OR QUESTIONS RELATED TO HOME HEALTH, SUCH AS TIME OR DATE PLEASE CONTACT YOUR HOME HEALTH AGENCY AT THE NUMBER LISTED ABOVE.

## 2024-02-23 NOTE — Brief Op Note (Signed)
 BRIEF OP NOTE    Date Time: 02/23/24 10:24 AM    Patient Name:   Roy Delgado, Roy Delgado (MRN: [id number])    Date of Operation:   02/23/2024     Providers Performing:   Surgeons and Role:     * Herb Re, MD - Primary     * Ethel Sherrilee SQUIBB, MD - Resident - Assisting    Surgical First Assistant(s):   None    Operative Procedure:   DIAGNOSTIC LAPAROSCOPY, EXPLORATORY LAPAROTOMY, LYSIS OF ADHESIONS, REDUCTION OF INTERNAL HERNIA : 50679 (CPT), 49000 (CPT), 44050 (CPT), 44005 (CPT)    Preoperative Diagnosis:   Pre-Op Diagnosis Codes:      * Small bowel obstruction [K56.609]    Postoperative Diagnosis:   Post-Op Diagnosis Codes:     * Small bowel obstruction [K56.609]    Findings:   Converted to laparotomy due to extensive adhesions and limited working space. Internal hernia formed from adhesions from omentum to small bowel as well as interloop small bowel adhesions. All adhesions from ligament of treitz to about mid ileum lysed with resolution of obstruction. Distal small bowel densely adherent to abdominal wall left alone as not causing obstruction.     Anesthesia:   general    Estimated Blood Loss:    15 mL    Implants:     Implant Name Type Inv. Item Serial No. Model No. Manufacturer Lot No. LRB No. Used Action   BARRIER ADH SOD HALUR CMC SPRFLM 6X5IN - [id number] Sheet BARRIER ADH SOD HALUR CMC SPRFLM 6X5IN  [id number] BAXTER [id number] N/A 1 Implanted           Drains:   Drains: no            Specimens:   None        Complications:   None     Signed by: Re Herb, MD                                                                           [facility]

## 2024-02-23 NOTE — Anesthesia Preprocedure Evaluation (Signed)
 Anesthesia Evaluation    AIRWAY    Mallampati: II    TM distance: >3 FB  Neck ROM: limited  Mouth Opening:full   CARDIOVASCULAR    cardiovascular exam normal       DENTAL                   PULMONARY    clear to auscultation     OTHER FINDINGS    Denies loose teeth                                    Relevant Problems   CARDIO   (+) Benign essential hypertension               Anesthesia Plan    ASA 3     general               (HTN  DM  Prostate CA  Influenza A positive - symptoms started half week ago - dry cough improving - SaO2 mid 90s on RA    ECHO 2017 EF 55-60%    S/p laparoscopy with LOA 12/2023 - now with recurrent volvulus  Has NGT in place    Denies hx of anesthesia complications)      intravenous induction   Detailed anesthesia plan: general endotracheal and PNB      Post Op: plan for postoperative opioid use    Post op pain management: per surgeon, PO analgesics and PNB single shot    informed consent obtained    Plan discussed with CRNA.    ECG reviewed  pertinent labs reviewed               Signed by: Gareth JONELLE Climes, MD 02/23/24 7:29 AM

## 2024-02-23 NOTE — Progress Notes (Signed)
 Right and Left TAP block administered by  Dr. Mauri Brooklyn in the OR.

## 2024-02-23 NOTE — Anesthesia Procedure Notes (Signed)
 Peripheral Nerve Block    Patient location during procedure: OR  Reason for block: Post-op pain management      Injection technique: Single Shot  Block Region: Transverse Abdominis Plane  Laterality: Bilateral      Block at surgeon's request: Yes      Start time: 02/23/2024 10:04 AM  End time: 02/23/2024 10:12 AM      Staffing  Anesthesiologist: Treva Gareth SAUNDERS, MD  Performed: Anesthesiologist     Pre-procedure Checklist   Completed: patient identified, surgical consent, pre-op evaluation, timeout performed, risks and benefits discussed, anesthesia consent given and correct site  Timeout Completed:  02/23/2024 10:04 AM      Peripheral Block  Patient monitoring: Pulse oximetry, EKG and NIBP  Patient position: SupinePremedication: Yes and Meaningful contact maintained  Local infiltration: Lidocaine  2%  Sterile technique: Chloraprep, Sterile gloves and Mask      Needle  Needle type: Stim needle   Needle gauge: 21 G  Needle length: 4 in      Guidance: ultrasound; image taken and retained in medical record  Ultrasound Guided: LA spread visualized, Needle visualized, Relevant anatomy identified (nerve, vessels, muscle) and Image stored or printed          Assessment   Incremental injection: yes  Injection made incrementally with aspirations every 5 mL.  Injection Resistance: noParesthesia Pain: No    Blood Aspirated: No  no suspected intravascular injection  Patient tolerated procedure well: Yes  Block Outcome: No complications

## 2024-02-23 NOTE — ED Notes (Signed)
Bed: GR7  Expected date:   Expected time:   Means of arrival:   Comments:  M,[phone]

## 2024-02-23 NOTE — Progress Notes (Signed)
 Patient arrived from PACU to the unit at 1301 with stable VS and c/o pain 5/10. NGT in place R nostril. Dressing to the abdomen appears clean, dry, and intact with no drainage visible. Patient is A&O x4 with IV fluids running at bedside in RFA IV and with no kinks in the IV tubing. Patient came up with all personal belongings at bedside. Patient oriented to the room and, taught the use of IS, call light, phone, and how to call dietary for meals. SCDs/foot pumps on patient and explained use. Addressed all questions and concerns.    4 eyes in 4 hours pressure injury assessment note:      Completed with: Wafa Tech  Unit & Time admitted: U 26  1328             Bony Prominences: Check appropriate box; if wound is present enter wound assessment in LDA     Occiput:                 [x] WNL  []  Wound present  Face:                     [x] WNL  []  Wound present  Ears:                      [x] WNL  []  Wound present  Spine:                    [x] WNL  []  Wound present  Shoulders:             [x] WNL  []  Wound present  Elbows:                  [x] WNL  []  Wound present  Sacrum/coccyx:     [x] WNL  []  Wound present  Ischial Tuberosity:  [x] WNL  []  Wound present  Trochanter/Hip:      [x] WNL  []  Wound present  Knees:                   [x] WNL  []  Wound present  Ankles:                   [x] WNL  []  Wound present  Heels:                    [x] WNL  []  Wound present  Other pressure areas:  []  Wound location    (Surgical incision abdomen)        Device related: []  Device name:         LDA completed if wound present: yes/no  Consult WOCN if necessary    Other skin related issues, ie tears, rash, etc, document in Integumentary flowsheet

## 2024-02-23 NOTE — ED to IP RN Note (Addendum)
 [facility] EMERGENCY DEPARTMENT  ED NURSING NOTE FOR THE RECEIVING INPATIENT NURSE   ED NURSE Pearson DUTY 3250   ED CHARGE RN Rosita   ADMISSION INFORMATION   Roy Delgado. is a 88 y.o. male admitted with an ED diagnosis of:    1. Influenza    2. Midgut volvulus         Isolation: None   Allergies: Patient has no known allergies.   Holding Orders confirmed? Yes   Belongings Documented? Yes   Home medications sent to pharmacy confirmed? No   NURSING CARE   Patient Comes From:   Mental Status: Home Independent  alert and oriented   ADL: Independent with all ADLs   Ambulation: Ambulated with rollator   Pertinent Information  and Safety Concerns:     [place] Violence Risk Level: Low 88 y/o male PT c/o NV. PT was recently [place] for same. NG tube placed.       CT / NIH   CT Head ordered on this patient?  No   NIH/Dysphagia assessment done prior to admission? No   VITAL SIGNS (at the time of this note)      Vitals:    02/23/24 0501   BP: 180/86   Pulse: 68   Resp: 21   Temp: 97.5 F (36.4 C)   SpO2: 100%     Pain Score: 2-mild pain (02/23/24 0330)

## 2024-02-23 NOTE — Consults (Signed)
 CONSULTATION REPORT    [facility]      Consultation Date Time: 02/23/24 6:33 AM  Date of Admission: 02/23/2024     Requesting Physician: Zhang, Keven, MD  Consulting Physician: Dr. Herb  Consulting Service: General Surgery    Reason For Consultation:  Abdominal Pain    History of Present Illness:   Roy Delgado. is a 88 y.o. male who  has a past medical history of Diabetes, Glaucoma, and Hypertension.and pshx appendectomy and dx lap with LOA. He presented to the hospital with complaints of abdominal pain. 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 Pt did as expected postoperatively and was discharged on 1/24. Pt now returns with severe acute onset n/v with abdominal discomfort that started yesterday similar to when he presented in     AF , VS wnl, on 2L O2. WBC 6, LA 1. + influenza A. CT with persistent dilation of the stomach, duo, and proximal jejunum concerning for persistent volvulus.   Past Medical History:     Past Medical History:   Diagnosis Date    Diabetes     Glaucoma     Hypertension        Past Surgical History:   Past Surgical History[1]    Family History:   Family History[2]    Social History:     Social History     Socioeconomic History    Marital status: Married     Spouse name: Not on file    Number of children: Not on file    Years of education: Not on file    Highest education level: Not on file   Occupational History    Not on file   Tobacco Use    Smoking status: Never    Smokeless tobacco: Never   Vaping Use    Vaping status: Never Used   Substance and Sexual Activity    Alcohol use: Not Currently     Comment: 2 drinks a month    Drug use: No    Sexual activity: Not Currently   Other Topics Concern    Not on file   Social History Narrative    Not on file     Social Drivers of Health     Financial Resource Strain: Low Risk  (02/28/2023)    Received from [facility]    Overall  Financial Resource Strain (CARDIA)     Difficulty of Paying Living Expenses: Not hard at all   Food Insecurity: No Food Insecurity (01/13/2024)    Hunger Vital Sign     Worried About Running Out of Food in the Last Year: Never true     Ran Out of Food in the Last Year: Never true   Transportation Needs: No Transportation Needs (01/13/2024)    PRAPARE - Therapist, Art (Medical): No     Lack of Transportation (Non-Medical): No   Physical Activity: Unknown (11/04/2017)    Received from [facility]    Exercise Vital Sign     Days of Exercise per Week: Patient declined     Minutes of Exercise per Session: Patient declined   Stress: No Stress Concern Present (11/04/2017)    Received from [facility] - Occupational Stress Questionnaire     Feeling of Stress : Not at all  Social Connections: Unknown (11/04/2017)    Received from [facility]    Social Connection and Isolation Panel [NHANES]     Frequency of Communication with Friends and Family: Patient declined     Frequency of Social Gatherings with Friends and Family: Patient declined     Attends Religious Services: Patient declined     Database Administrator or Organizations: Patient declined     Attends Banker Meetings: Patient declined     Marital Status: Patient declined   Intimate Partner Violence: Not At Risk (01/13/2024)    Humiliation, Afraid, Rape, and Kick questionnaire     Fear of Current or Ex-Partner: No     Emotionally Abused: No     Physically Abused: No     Sexually Abused: No   Housing Stability: Low Risk  (01/13/2024)    Housing Stability Vital Sign     Unable to Pay for Housing in the Last Year: No     Number of Times Moved in the Last Year: 0     Homeless in the Last Year: No       Allergies:   Allergies[3]    Medications:     Prior to Admission medications    Medication Sig Start Date End Date Taking? Authorizing  Provider   Brimonidine Tartrate-Timolol  (COMBIGAN OP) Apply 1 drop to eye 2 (two) times daily.    [provider]   hydrochlorothiazide  (HYDRODIURIL ) 25 MG tablet Take 25 mg by mouth daily.    [provider]   insulin  glargine (LANTUS ) 100 UNIT/ML injection Inject 27 Units into the skin daily    [provider]   Insulin  Lispro (HUMALOG  SC) Inject 15 Units into the skin 3 (three) times daily.    [provider]   levocetirizine (XYZAL) 2.5 MG/5ML solution Take 5 mLs (2.5 mg) by mouth every evening    [provider]   metoprolol  succinate XL (TOPROL -XL) 25 MG 24 hr tablet Take 1 tablet (25 mg) by mouth daily    [provider]   montelukast  (SINGULAIR ) 10 MG tablet Take 1 tablet (10 mg) by mouth nightly    [provider]   olmesartan (BENICAR) 40 MG tablet Take 40 mg by mouth daily.    [provider]   pregabalin  (LYRICA ) 100 MG capsule Take 1 capsule (100 mg) by mouth daily    [provider]       Review of Systems:   As per the HPI.  The patient denies any additional changes to their otic, opthalmologic, dermatologic, pulmonary, cardiac, gastrointestinal, genitourinary, musculoskeletal, hematologic, constitutional, or psychiatric systems.      Physical Exam:     Vitals:    02/23/24 0600   BP: 131/81   Pulse: 89   Resp: 18   Temp:    SpO2: 92%       General appearance - alert, well appearing, and in no distress  Mental status - alert, oriented to person, place, and time  Eyes - sclera anicteric  Chest - no tachypnea, retractions or cyanosis  Heart - normal rate and regular rhythm  Abdomen - soft, minimally tender, mildly distended  Extremities - no pedal edema noted    Labs:     Results       Procedure Component Value Units Date/Time    COVID-19 and Influenza (Liat) (symptomatic) [[id number]]  (Abnormal) Collected: 02/23/24 0325    Specimen: Swab from Anterior Nares Updated: 02/23/24 0401  SARS-CoV-2 (COVID-19) RNA Not Detected      Influenza A RNA Detected     Influenza B RNA Not Detected    Narrative:      A result of Detected indicates POSITIVE for the presence of viral RNA  A result of Not Detected indicates NEGATIVE for the presence of viral RNA    Test performed using the Roche cobas Liat SARS-CoV-2 & Influenza A/B assay. This is a multiplex real-time RT-PCR assay for the detection of SARS-CoV-2, influenza A, and influenza B virus RNA. Viral nucleic acids may persist in vivo, independent of viability. Detection of viral nucleic acid does not imply the presence of infectious virus, or that virus nucleic acid is the cause of clinical symptoms. Negative results do not preclude SARS-CoV-2, influenza A, and/or influenza B infection and should not be used as the sole basis for diagnosis, treatment or other patient management decisions. Invalid results may be due to inhibiting substances in the specimen and recollection should occur.     High Sensitivity Troponin-I [[id number]]  (Normal) Collected: 02/23/24 0246    Specimen: Blood, Venous Updated: 02/23/24 0319     hs Troponin 10.4 ng/L     Lipase [[id number]]  (Normal) Collected: 02/23/24 0246    Specimen: Blood, Venous Updated: 02/23/24 0315     Lipase 19 U/L     Comprehensive Metabolic Panel [[id number]]  (Abnormal) Collected: 02/23/24 0246    Specimen: Blood, Venous Updated: 02/23/24 0310     Glucose 157 mg/dL      BUN 27 mg/dL      Creatinine 1.3 mg/dL      Sodium 860 mEq/L      Potassium 5.3 mEq/L      Chloride 103 mEq/L      CO2 25 mEq/L      Calcium 9.4 mg/dL      Anion Gap 88.9     GFR 53.8 mL/min/1.73 m2      AST (SGOT) 25 U/L      ALT 12 U/L      Alkaline Phosphatase 162 U/L      Albumin  4.0 g/dL      Protein, Total 8.1 g/dL      Globulin 4.1 g/dL      Albumin /Globulin Ratio 1.0     Bilirubin, Total 0.5 mg/dL     Lactic Acid [[id number]]  (Normal) Collected: 02/23/24 0246    Specimen: Blood, Venous Updated: 02/23/24 0300     Whole Blood Lactic Acid 1.8 mmol/L     CBC with  Differential (Order) [[id number]]  (Abnormal) Collected: 02/23/24 0246    Specimen: Blood, Venous Updated: 02/23/24 0256    Narrative:      The following orders were created for panel order CBC with Differential (Order).  Procedure                               Abnormality         Status                     ---------                               -----------         ------                     CBC with Differential (...[[id number]]  Abnormal  Final result                 Please view results for these tests on the individual orders.    CBC with Differential (Component) [[id number]]  (Abnormal) Collected: 02/23/24 0246    Specimen: Blood, Venous Updated: 02/23/24 0256     WBC 6.04 x10 3/uL      Hemoglobin 13.2 g/dL      Hematocrit 59.3 %      Platelet Count 351 x10 3/uL      MPV 10.9 fL      RBC 4.55 x10 6/uL      MCV 89.2 fL      MCH 29.0 pg      MCHC 32.5 g/dL      RDW 15 %      nRBC % 0.0 /100 WBC      Absolute nRBC 0.00 x10 3/uL      Preliminary Absolute Neutrophil Count 4.47 x10 3/uL      Neutrophils % 73.9 %      Lymphocytes % 13.4 %      Monocytes % 7.0 %      Eosinophils % 4.5 %      Basophils % 0.7 %      Immature Granulocytes % 0.5 %      Absolute Neutrophils 4.47 x10 3/uL      Absolute Lymphocytes 0.81 x10 3/uL      Absolute Monocytes 0.42 x10 3/uL      Absolute Eosinophils 0.27 x10 3/uL      Absolute Basophils 0.04 x10 3/uL      Absolute Immature Granulocytes 0.03 x10 3/uL     Culture, Blood, Aerobic And Anaerobic [[id number]] Collected: 02/23/24 0246    Specimen: Blood, Venous Updated: 02/23/24 0252            Rads:     Radiology Results (24 Hour)       Procedure Component Value Units Date/Time    Chest AP Portable [[id number]] Resulted: 02/23/24 0536    Order Status: Sent Updated: 02/23/24 0613    CT Abd/Pelvis with IV Contrast [[id number]] Collected: 02/23/24 0430    Order Status: Completed Updated: 02/23/24 0445    Narrative:      HISTORY: History of volvulus. Worsening abdominal  pain.    COMPARISON: CT abdomen/pelvis 01/13/2024    TECHNIQUE: CT of the abdomen and pelvis performed with intravenous  contrast. The following dose reduction techniques were utilized: automated  exposure control and/or adjustment of the mA and/or KV according to patient  size, and the use of an iterative reconstruction technique.    CONTRAST: iohexol  (OMNIPAQUE ) 350 MG/ML injection 100 mL    FINDINGS:  Lung bases: Chronic interstitial thickening probably chronic changes of the  bilateral lung bases. Elevation of the left hemidiaphragm with left basilar  atelectasis.    Hepatobiliary: Stable small cyst right hepatic lobe. The gallbladder is  distended and without evidence of radiopaque stone or wall thickening. No  intrahepatic or extrahepatic biliary dilatation.    Spleen: Normal.  Adrenal Glands: Normal.  Pancreas: Normal.    Renal: Bilateral kidneys are atrophic and notable for chronic multifocal  renal scarring. Multiple nonobstructing stones in the left kidney. No  hydronephrosis. No nephroureteral stone or obstructive uropathy. Visualized  bladder is unremarkable.    Gastrointestinal: The stomach and duodenum are fluid-filled and dilated.  There is a transition point in the proximal jejunum. Swirling/rotation of  the mesenteric root is again noted suggestive of midgut volvulus. There  are  also a few mildly dilated fluid-filled loops of distal jejunum. No evidence  of acute appendicitis.    Mesentery/Other: No free fluid or free air. No pathologic adenopathy.  Vascular structures are within normal limits. No abdominal aortic aneurysm.    Bones/Soft Tissues: No osseous lesion or fracture. Extensive degenerative  changes in the lower lumbar spine.        Impression:        1.Persistent dilation of the stomach, duodenum, and proximal jejunum with  findings suggestive of midgut volvulus.   2.Chronic renal atrophy and scarring.  3.Nonobstructing left renal stones.    Justus Radford, MD  02/23/2024 4:43 AM    XR Chest  AP  Portable [[id number]] Collected: 02/23/24 0329    Order Status: Completed Updated: 02/23/24 0333    Narrative:      HISTORY: Hypoxia, nausea, vomiting.      COMPARISON: XR CHEST 2025-Jan-20    FINDINGS:   Previously seen enteric catheter has been removed. Mild cardiomegaly is  unchanged. Atherosclerotic calcification in the aortic arch.    Diffuse prominence of the central vascular and interstitial lung markings.  No lobar airspace consolidation. Elevation of the left hemidiaphragm with  left basilar atelectasis.  The stomach remains enlarged and opacified with  gas. Osseous structures are demineralized.      Impression:        1.Mild cardiomegaly with diffuse prominence of the central vascular and  interstitial lung markings, suggestive of pulmonary congestion.  2.Elevation of the left hemidiaphragm with left basilar atelectasis.  3.Stomach remains enlarged and opacified with gas.    Justus Radford, MD  02/23/2024 3:31 AM            Impression:   Problem List[4]  55M with abdominal pain and CT concerning for recurrent volvulus.     Plan:   - To OR for dx lap possible ex lap, possible bowel resection  - NPO, mIVF  - abx: preop ancef     Discuss with Dr. Herb    Signed by: Sherrilee SHAUNNA Officer, MD    Attending Note:    [phone] that presents with acute abdominal pain, nausea and vomiting that started last night. He was admitted here in January for similar symptoms and was found on imaging to have SBO due to a proximal small bowel volvulus for which he was taken to the operating room by Dr. Soyla for dx lap, LOA, and reduction of internal hernia secondary to adhesion. He had been doing well since the surgery until last night. Only other surgery was an open appendectomy > 30 years ago. Flu+. Vitals okay. Abd soft, mildly distended, mildly tender. Labs unremarkable. CT reviewed shows dilated stomach, duo, proximal jejunum with transition point proximally with swirling of the mesentery concerning for volvulus. This was compared to his  last CT in January pre-op and very similar. NGT has been placed. Plan for diagnostic laparoscopy, possible ex lap and other indicated procedures urgently. Risks/benefits discussed with patient and his daughter.     I, Emery Herb , MD, have personally seen and examined this patient and have participated in their care. I agree with all clinical information, including the physical exam, patient history, and planning as documented by the resident or edited the note as needed. All notes, labs, and imaging were reviewed. The patient was seen on the date the note was written.       Emery Herb, MD  [facility]  [phone]         [  1]   Past Surgical History:  Procedure Laterality Date    appendectomy 1991      LAPAROSCOPY, DIAGNOSTIC N/A 01/13/2024    Procedure: LAPAROSCOPY, DIAGNOSTIC, LYSIS OF ADHESIONS;  Surgeon: Soyla Dallas BROCKS, DO;  Location: [facility];  Service: General;  Laterality: N/A;    LAPAROSCOPY, DIAGNOSTIC BARIATRIC  01/13/2024    Procedure: LAPAROSCOPY, REDUCTION OF VOLVULUS, REPAIR INTERNAL HERNIA;  Surgeon: Soyla Dallas BROCKS, DO;  Location: [facility];  Service: General;;   [2]   Family History  Problem Relation Name Age of Onset    Colon cancer Father     [3] No Known Allergies  [4]   Patient Active Problem List  Diagnosis    Prostate cancer    Volvulus    Chronic systolic CHF (congestive heart failure), NYHA class 2    Benign essential hypertension    Chronic pain disorder    Type II or unspecified type diabetes mellitus with renal manifestations, not stated as uncontrolled(250.40)    Cystitis    Influenza

## 2024-02-23 NOTE — Brief Op Note (Signed)
 BRIEF OP NOTE    Date Time: 02/23/24 10:14 AM    Patient Name:   Roy Delgado.    Date of Operation:   02/23/2024    Providers Performing:   Surgeon(s):  Herb Re, MD  Ethel Sherrilee SQUIBB, MD    Assistant (s):   Circulator: Rafanan, Earnie Estefana Deans, RN  Scrub Person: Malachy Raisin    Operative Procedure:   Procedure(s):  DIAGNOSTIC LAPAROSCOPY, EXPLORATORY LAPAROTOMY, LYSIS OF ADHESIONS, REDUCTION OF INTERNAL HERNIA (50679, 49400, 44050, 44005)    Preoperative Diagnosis:   Pre-Op Diagnosis Codes:      * Small bowel obstruction [K56.609]    Postoperative Diagnosis:   Post-Op Diagnosis Codes:     * Small bowel obstruction [K56.609]    Anesthesia:   General    Estimated Blood Loss:    15 mL    Implants:     Implant Name Type Inv. Item Serial No. Model No. Manufacturer Lot No. LRB No. Used Action   BARRIER ADH SOD HALUR CMC SPRFLM 6X5IN - [id number] Sheet BARRIER ADH SOD HALUR CMC SPRFLM 6X5IN  [id number] BAXTER [id number] N/A 1 Implanted            Drains:   Drains: no      Specimens:   none      Findings:   Internal hernia 2/2 omental and mesenteric adhesions with proximal dilation of small bowel. Bowel all pink and healthy.   LOA    Complications:   none      Plan:  - NPO, mIVF  - continue NGT to LCWS. Flush tube q4hrs or more if clogged.   -Water  via clear port (20-30cc tap water ), air ONLY via blue port; do not cap/block/tie off/use valve on blue port (leaking from blue port indicates tube is clogged and requires flushing)  - no abx indicated    Signed by: Sherrilee SQUIBB Ethel, MD                                                                           [facility]

## 2024-02-23 NOTE — Progress Notes (Addendum)
 [facility]  PROGRESS NOTE      Patient: Roy Delgado.  Date: 02/23/2024   LOS: 0 Days  Admission Date: 02/23/2024   MRN: [id number]  Attending: Wyatt FORBES Snow, MD  Please contact me on Epic secure chat        ASSESSMENT/PLAN     Roy Rushing Ormond Lazo. is a 88 y.o. male w/ pmhx of DMT2, HTN, internal hernia, recent admission for internal hernia causing bowel obstruction (2/2 adhesive tissue), now s/p diagnostic laparoscopy with LOA and reduction of volvulus on 01/13/2024 admitted for recurrent midgut volvulus s/p diagnostic laparoscopy with LOA and reduction of internal hernia on 3/2.     # Recurrent midgut volvulus  # Nausea, vomiting, abdominal pain  -S/p diagnostic laparoscopy with LOA and reduction of internal hernia on 3/2  -Discussed with surgery, n.p.o., continue maintenance IV fluids, NG tube to low continuous wall suction, flush tube every 4 hours or more if clogged  -N.p.o  -Pain control with IV Tylenol , as needed oxycodone  and IV Dilaudid      # Flu a positive  # Acute hypoxemic respiratory failure  -Per daughter patient took 4 doses of Tamiflu  at home  -CXR with interstitial lung markings concerning for pulmonary congestion  -Follow-up NT-proBNP  -Echo 12/19/2016  notable for LVEF 55 to 60%,     # AKI  Likely prerenal in setting of dehydration.  Given 1 L NS in the ED  -Continuous fluids as above  -Check PVR, UA     # Hypertension  -Family patient is taking olmesartan and losartan  at home.  Will stop olmesartan and only continue losartan .  Patient is currently taking 25 mg twice daily, will consolidate dose to 50 mg once daily  -Continue losartan  50 mg once daily  -Continue metoprolol      # Type 2 diabetes  Takes insulin  glargine 27 units daily, lispro 15 units 3 times daily  -Reduce insulin  glargine to 10 units daily while n.p.o.  -Low-dose insulin  sliding scale    #Allergies  -continue montelukast  and formulary antihistamine    #Mood disorder  -continue sertraline     #Neuropathy  -continue  lyrica       Nutrition: N.p.o.  Code: Full  DVT prophylaxis:   Current Facility-Administered Medications (Includes Only Anticoagulants, Misc. Hematological)   Medication Dose Route Last Admin   . enoxaparin  (LOVENOX ) syringe 40 mg  40 mg Subcutaneous       Disposition:   50 minutes spent in care and evaluation of patient.              Signed,  Wyatt FORBES Snow, MD  2:26 PM 02/23/2024   Time signed does not reflect time patient seen.     SUBJECTIVE   Patient reports he feels okay.  His abdomen is sore but denies any nausea or vomiting currently.    OBJECTIVE     Vitals:    02/23/24 1301   BP: 159/89   Pulse: 83   Resp: 16   Temp: 97.5 F (36.4 C)   SpO2: 98%       Temperature: Temp  Min: 97.2 F (36.2 C)  Max: 98.6 F (37 C)  Pulse: Pulse  Min: 68  Max: 107  Respiratory: Resp  Min: 11  Max: 21  Non-Invasive BP: BP  Min: 113/67  Max: 217/104  Pulse Oximetry SpO2  Min: 89 %  Max: 100 %    Intake and Output Summary (Last 24 hours) at Date Time  Intake/Output Summary (Last 24 hours) at 02/23/2024 1426  Last data filed at 02/23/2024 1010  Gross per 24 hour   Intake 1850 ml   Output 1895 ml   Net -45 ml          Physical Examination:    General:  No acute distress   HEENT: Normocephalic, atraumatic, extraocular movements intact, normal conjunctivae. No scleral icterus.  NG tube in place draining dark brown fluid   cardiovascular: Normal rate  Respiratory: Normal respiratory rate. s.   Gastrointestinal: soft, non-tender, non-distended.   Skin: Dry. No rashes, jaundice or other lesions.   Neurologic: AAOx3.  Face symmetric.  CN 2-12 grossly intact.     MEDICATIONS     Current Facility-Administered Medications   Medication Dose Route Frequency   . acetaminophen   1,000 mg Intravenous Q8H   . cetirizine   5 mg Oral Daily   . dorzolamide -timolol   1 drop Both Eyes Q12H SCH   . enoxaparin   40 mg Subcutaneous Daily   . insulin  glargine  10 Units Subcutaneous Daily   . insulin  lispro  1-3 Units Subcutaneous QHS   . insulin  lispro  1-5  Units Subcutaneous TID AC   . losartan   25 mg Oral BID   . metoprolol  succinate XL  25 mg Oral Daily   . montelukast   10 mg Oral QHS   . oseltamivir   30 mg Oral Q12H SCH   . pregabalin   100 mg Oral QHS   . sertraline   25 mg Oral Daily   . sodium chloride   500 mL Intravenous Once            LABS/IMAGING     Recent Labs   Lab 02/23/24  0246   WBC 6.04   RBC 4.55   Hemoglobin 13.2   Hematocrit 40.6   MCV 89.2   Platelet Count 351*       Recent Labs   Lab 02/23/24  0246   Sodium 139   Potassium 5.3   Chloride 103   CO2 25   BUN 27   Creatinine 1.3   Glucose 157*   Calcium 9.4       Recent Labs   Lab 02/23/24  0246   ALT 12   AST (SGOT) 25   Bilirubin, Total 0.5   Albumin  4.0   Alkaline Phosphatase 162*       Recent Labs   Lab 02/23/24  0246   hs Troponin 10.4             Microbiology Results (last 15 days)       Procedure Component Value Units Date/Time    COVID-19 and Influenza (Liat) (symptomatic) [[id number]]  (Abnormal) Collected: 02/23/24 0325    Order Status: Completed Specimen: Swab from Anterior Nares Updated: 02/23/24 0401     SARS-CoV-2 (COVID-19) RNA Not Detected     Influenza A RNA Detected     Influenza B RNA Not Detected    Narrative:      A result of Detected indicates POSITIVE for the presence of viral RNA  A result of Not Detected indicates NEGATIVE for the presence of viral RNA    Test performed using the Roche cobas Liat SARS-CoV-2 & Influenza A/B assay. This is a multiplex real-time RT-PCR assay for the detection of SARS-CoV-2, influenza A, and influenza B virus RNA. Viral nucleic acids may persist in vivo, independent of viability. Detection of viral nucleic acid does not imply the presence of infectious virus, or that virus nucleic acid  is the cause of clinical symptoms. Negative results do not preclude SARS-CoV-2, influenza A, and/or influenza B infection and should not be used as the sole basis for diagnosis, treatment or other patient management decisions. Invalid results may be due to  inhibiting substances in the specimen and recollection should occur.     Culture, Blood, Aerobic And Anaerobic [[id number]] Collected: 02/23/24 0246    Order Status: Resulted Specimen: Blood, Venous Updated: 02/23/24 0252             Chest AP Portable    Result Date: 02/23/2024  1.NG tube in the stomach. 2.Bibasilar atelectasis. Reena D'Heureux, MD 02/23/2024 9:45 AM    CT Abd/Pelvis with IV Contrast    Result Date: 02/23/2024  1.Persistent dilation of the stomach, duodenum, and proximal jejunum with findings suggestive of midgut volvulus. 2.Chronic renal atrophy and scarring. 3.Nonobstructing left renal stones. Justus Radford, MD 02/23/2024 4:43 AM    XR Chest  AP Portable    Result Date: 02/23/2024  1.Mild cardiomegaly with diffuse prominence of the central vascular and interstitial lung markings, suggestive of pulmonary congestion. 2.Elevation of the left hemidiaphragm with left basilar atelectasis. 3.Stomach remains enlarged and opacified with gas. Justus Radford, MD 02/23/2024 3:31 AM     Echo Results       None            No results found for this or any previous visit.

## 2024-02-23 NOTE — Anesthesia Postprocedure Evaluation (Signed)
 Anesthesia Post Evaluation    Patient: Roy W Bellot Jr.    DIAGNOSTIC LAPAROSCOPY, EXPLORATORY LAPAROTOMY, LYSIS OF ADHESIONS, REDUCTION OF INTERNAL HERNIA (50679, 49400, [id number], [id number]) (Pelvis)    Anesthesia type: general    Last Vitals:   Vitals Value Taken Time   BP 159/79 02/23/24 1130   Temp  02/23/24 1203   Pulse 73 02/23/24 1130   Resp 12 02/23/24 1130   SpO2 99 % 02/23/24 1130                 Anesthesia Post Evaluation:     Patient Evaluated: PACU  Patient Participation: complete - patient participated  Level of Consciousness: awake    Pain Management: adequate  Multimodal analgesia pain management approach    Airway Patency: patent  Two or more mitigation strategies used for obstructive sleep apnea.  Strategies: multimodal analgesia and awake extubation    Anesthetic complications: No      PONV Status: none    Cardiovascular status: acceptable  Respiratory status: acceptable  Hydration status: acceptable          Signed by: Gareth JONELLE Climes, MD, 02/23/2024 12:03 PM

## 2024-02-23 NOTE — Progress Notes (Signed)
 Resting, appears more comfortable aftre receiving pain meds as ordered. VSS. Family has been updated. NG tube draining aprox 700 mL brownish content, maintained on continuous LWS. Received bed 2621, awaiting for an assigned nurse.

## 2024-02-23 NOTE — Progress Notes (Signed)
 Phase 1 discharge criteria met. Patient is awake and alert. Pain level is controlled to tolerable level. Respirations easy and non labored. Transferred on 3 Liters O2 NC. All vital signs remain stable. Surgical dressing clean dry and intact. NG patent, drained 787m L brown colored content prior to transfer. Telephone report given in [place] format to [place], [facility]. Pt transported via bed, escorted by transportation services to room .  Pt resting comfortably in bed with no signs of pain or distress noted prior to leaving PACU. Daughter at the bedside.

## 2024-02-23 NOTE — Plan of Care (Addendum)
 NURSING SHIFT NOTE     Patient: Roy Delgado.  Day: 0      SHIFT EVENTS     Shift Narrative/Significant Events (PRN med administration, fall, RRT, etc.):     Patient A&O x4, currently NPO. On 2L NC. Denies chest pain, SOB, N/V. Patient c/o pain during shift but stated is tolerable, No PRN meds given. NGT in R nostril on low continuous suction, tube maintenance done during shift. Incision in abdomen CDI. Straight cath done ( ). MD aware, started on flomax . DTV by [id number].    Safety and fall precautions remain in place. Purposeful rounding completed.          ASSESSMENT     Changes in assessment from patient's baseline this shift:    Neuro: No  CV: No  Pulm: No  Peripheral Vascular: No  HEENT: No  GI: No  BM during shift: No, Last BM: Last BM Date: 02/22/24  GU: No   Integ: No  MS: No    Pain: Improved  Pain Interventions: Medications, Rest, and Positioning  Medications Utilized: NO PRN meds given    Mobility: PMP Activity: Step 3 - Bed Mobility of             Lines     Patient Lines/Drains/Airways Status       Active Lines, Drains and Airways       Name Placement date Placement time Site Days    Peripheral IV 02/23/24 20 G Standard Right Antecubital 02/23/24  0248  Antecubital  less than 1    Peripheral IV 02/23/24 18 G Standard Right Hand 02/23/24  0800  Hand  less than 1    NG/OG Tube Nasogastric 18 Fr. Right nostril 02/23/24  0523  Right nostril  less than 1                         VITAL SIGNS     Vitals:    02/23/24 1531   BP: 146/86   Pulse: 86   Resp: 16   Temp: 97.3 F (36.3 C)   SpO2: 96%       Temp  Min: 97.2 F (36.2 C)  Max: 98.6 F (37 C)  Pulse  Min: 68  Max: 107  Resp  Min: 11  Max: 21  BP  Min: 113/67  Max: 217/104  SpO2  Min: 89 %  Max: 100 %      Intake/Output Summary (Last 24 hours) at 02/23/2024 1559  Last data filed at 02/23/2024 1010  Gross per 24 hour   Intake 1850 ml   Output 1895 ml   Net -45 ml                  CARE PLAN        Problem: Pain interferes with ability to perform  ADL  Goal: Pain at adequate level as identified by patient  02/23/2024 1558 by Cena Rank, Dagmar Goodrich, RN  Outcome: Progressing  Flowsheets (Taken 02/23/2024 1557)  Pain at adequate level as identified by patient:   Identify patient comfort function goal   Assess for risk of opioid induced respiratory depression, including snoring/sleep apnea. Alert healthcare team of risk factors identified.   Assess pain on admission, during daily assessment and/or before any as needed intervention(s)   Reassess pain within 30-60 minutes of any procedure/intervention, per Pain Assessment, Intervention, Reassessment (AIR) Cycle   Evaluate if patient comfort function goal is met  Consult/collaborate with Pain Service   Evaluate patient's satisfaction with pain management progress   Offer non-pharmacological pain management interventions   Consult/collaborate with Physical Therapy, Occupational Therapy, and/or Speech Therapy   Include patient/patient care companion in decisions related to pain management as needed  02/23/2024 1557 by Cena Rank, Dagmar Goodrich, RN  Outcome: Progressing  Flowsheets (Taken 02/23/2024 1557)  Pain at adequate level as identified by patient:   Identify patient comfort function goal   Assess for risk of opioid induced respiratory depression, including snoring/sleep apnea. Alert healthcare team of risk factors identified.   Assess pain on admission, during daily assessment and/or before any as needed intervention(s)   Reassess pain within 30-60 minutes of any procedure/intervention, per Pain Assessment, Intervention, Reassessment (AIR) Cycle   Evaluate if patient comfort function goal is met   Consult/collaborate with Pain Service   Evaluate patient's satisfaction with pain management progress   Offer non-pharmacological pain management interventions   Consult/collaborate with Physical Therapy, Occupational Therapy, and/or Speech Therapy   Include patient/patient care companion in decisions  related to pain management as needed     Problem: Moderate/High Fall Risk Score >5  Goal: Patient will remain free of falls  02/23/2024 1558 by Cena Rank, Dagmar Goodrich, RN  Outcome: Progressing  Flowsheets (Taken 02/23/2024 1335)  High (Greater than 13):   HIGH-Bed alarm on at all times while patient in bed   HIGH-Utilize chair pad alarm for patient while in the chair  02/23/2024 1557 by Cena Rank, Dagmar Goodrich, RN  Outcome: Progressing  Flowsheets (Taken 02/23/2024 1335)  High (Greater than 13):   HIGH-Bed alarm on at all times while patient in bed   HIGH-Utilize chair pad alarm for patient while in the chair     Problem: Safety  Goal: Patient will be free from injury during hospitalization  Outcome: Progressing  Flowsheets (Taken 02/23/2024 1558)  Patient will be free from injury during hospitalization:   Assess patient's risk for falls and implement fall prevention plan of care per policy   Include patient/ family/ care giver in decisions related to safety   Use appropriate transfer methods   Ensure appropriate safety devices are available at the bedside   Provide and maintain safe environment   Hourly rounding   Assess for patients risk for elopement and implement Elopement Risk Plan per policy   Provide alternative method of communication if needed (communication boards, writing)     Problem: Altered GI Function  Goal: Fluid and electrolyte balance are achieved/maintained  Outcome: Progressing  Flowsheets (Taken 02/23/2024 1558)  Fluid and electrolyte balance are achieved/maintained:   Observe for cardiac arrhythmias   Monitor/assess lab values and report abnormal values   Monitor for muscle weakness   Assess and reassess fluid and electrolyte status

## 2024-02-23 NOTE — H&P (Addendum)
 [facility]      Patient: Roy Delgado.  Date: 02/23/2024   DOB: 11-25-1936  Date of Admission: 02/23/2024   MRN: [id number]  Attending: Zuha Dejonge, MD         Chief Complaint   Patient presents with    Nausea    Emesis        HISTORY AND PHYSICAL     Roy Beranek Weslie Pretlow. is a 88 y.o. male with a PMHx of DM1, peripheral neuropathy, glaucoma (diabetic proliferative eye disease), HTN, prostate cancer hx  who presents with abdominal pain    Symptoms started yesterday afternoon and include nausea, vomiting, and abdominal pain.  Symptoms associated with dry cough.  Patient denies fevers, chills, shortness of breath, tobacco, alcohol, other drug use.  Underwent abdominal surgery 1 month ago, for volvulus, patient denies prior surgeries except for appendectomy 30 years ago.  In the ED, CT notable for midgut volvulus.  Surgery was consulted and recommended likely surgery in the morning.  Patient also found to be flu a positive, intermittently requiring 2 L nasal cannula.  NG tube placed.    Medical History[1]    Past Surgical History[2]    Prior to Admission medications    Medication Sig Start Date End Date Taking? Authorizing Provider   Brimonidine Tartrate-Timolol  (COMBIGAN OP) Apply 1 drop to eye 2 (two) times daily.    [provider]   hydrochlorothiazide  (HYDRODIURIL ) 25 MG tablet Take 25 mg by mouth daily.    [provider]   insulin  glargine (LANTUS ) 100 UNIT/ML injection Inject 27 Units into the skin daily    [provider]   Insulin  Lispro (HUMALOG  SC) Inject 15 Units into the skin 3 (three) times daily.    [provider]   levocetirizine (XYZAL) 2.5 MG/5ML solution Take 5 mLs (2.5 mg) by mouth every evening    [provider]   metoprolol  succinate XL (TOPROL -XL) 25 MG 24 hr tablet Take 1 tablet (25 mg) by mouth daily    [provider]   montelukast  (SINGULAIR ) 10 MG tablet Take 1 tablet (10 mg) by mouth nightly    [provider]    olmesartan (BENICAR) 40 MG tablet Take 40 mg by mouth daily.    [provider]   pregabalin  (LYRICA ) 100 MG capsule Take 1 capsule (100 mg) by mouth daily    [provider]       Allergies[3]    Family History[4]    Social History[5]      PHYSICAL EXAM     Vital Signs (most recent): BP 120/80   Pulse 97   Temp 97.3 F (36.3 C) (Oral)   Resp 18   Wt 79.4 kg (175 lb)   SpO2 97%   BMI 22.77 kg/m   Constitutional: No apparent distress. Patient speaks freely in full sentences.   Cardiovascular: RRR, no murmurs, gallops  Respiratory: Normal rate. No increased work of breathing. Clear to auscultation bilaterally.  Gastrointestinal: non-distended, soft, non-tender, no rebound or guarding  Musculoskeletal: ROM and motor strength grossly normal. No edema  Neurologic: EOMI, CN 2-12 grossly intact  Psychiatric: AAOx3, affect and mood appropriate. The patient is alert, interactive, appropriate.    LABS & IMAGING     Recent Results (from the past 24 hours)   Comprehensive Metabolic Panel    Collection Time: 02/23/24  2:46 AM   Result Value Ref Range    Glucose 157 (H) 70 - 100 mg/dL  BUN 27 9 - 28 mg/dL    Creatinine 1.3 0.5 - 1.5 mg/dL    Sodium 860 864 - 854 mEq/L    Potassium 5.3 3.5 - 5.3 mEq/L    Chloride 103 99 - 111 mEq/L    CO2 25 17 - 29 mEq/L    Calcium 9.4 7.9 - 10.2 mg/dL    Anion Gap 88.9 5.0 - 15.0    GFR 53.8 (L) >=60.0 mL/min/1.73 m2    AST (SGOT) 25 <=41 U/L    ALT 12 <=55 U/L    Alkaline Phosphatase 162 (H) 37 - 117 U/L    Albumin  4.0 3.5 - 5.0 g/dL    Protein, Total 8.1 6.0 - 8.3 g/dL    Globulin 4.1 (H) 2.0 - 3.6 g/dL    Albumin /Globulin Ratio 1.0 0.9 - 2.2    Bilirubin, Total 0.5 0.2 - 1.2 mg/dL   Lipase    Collection Time: 02/23/24  2:46 AM   Result Value Ref Range    Lipase 19 8 - 78 U/L   High Sensitivity Troponin-I    Collection Time: 02/23/24  2:46 AM   Result Value Ref Range    hs Troponin 10.4 <=35.0 ng/L   Lactic Acid    Collection Time: 02/23/24  2:46 AM   Result Value  Ref Range    Whole Blood Lactic Acid 1.8 0.2 - 2.0 mmol/L   CBC with Differential (Component)    Collection Time: 02/23/24  2:46 AM   Result Value Ref Range    WBC 6.04 3.10 - 9.50 x10 3/uL    Hemoglobin 13.2 12.5 - 17.1 g/dL    Hematocrit 59.3 62.3 - 49.6 %    Platelet Count 351 (H) 142 - 346 x10 3/uL    MPV 10.9 8.9 - 12.5 fL    RBC 4.55 4.20 - 5.90 x10 6/uL    MCV 89.2 78.0 - 96.0 fL    MCH 29.0 25.1 - 33.5 pg    MCHC 32.5 31.5 - 35.8 g/dL    RDW 15 11 - 15 %    nRBC % 0.0 <=0.0 /100 WBC    Absolute nRBC 0.00 <=0.00 x10 3/uL    Preliminary Absolute Neutrophil Count 4.47 1.10 - 6.33 x10 3/uL    Neutrophils % 73.9 Not Established %    Lymphocytes % 13.4 Not Established %    Monocytes % 7.0 Not Established %    Eosinophils % 4.5 Not Established %    Basophils % 0.7 Not Established %    Immature Granulocytes % 0.5 Not Established %    Absolute Neutrophils 4.47 1.10 - 6.33 x10 3/uL    Absolute Lymphocytes 0.81 0.42 - 3.22 x10 3/uL    Absolute Monocytes 0.42 0.21 - 0.85 x10 3/uL    Absolute Eosinophils 0.27 0.00 - 0.44 x10 3/uL    Absolute Basophils 0.04 0.00 - 0.08 x10 3/uL    Absolute Immature Granulocytes 0.03 0.00 - 0.07 x10 3/uL   COVID-19 and Influenza ([place]) (symptomatic)    Collection Time: 02/23/24  3:25 AM    Specimen: Anterior Nares; Swab   Result Value Ref Range    SARS-CoV-2 (COVID-19) RNA Not Detected Not Detected    Influenza A RNA Detected (A) Not Detected    Influenza B RNA Not Detected Not Detected       IMAGING:  CT Abd/Pelvis with IV Contrast    Result Date: 02/23/2024  1.Persistent dilation of the stomach, duodenum, and proximal jejunum with findings suggestive of  midgut volvulus. 2.Chronic renal atrophy and scarring. 3.Nonobstructing left renal stones. Justus Radford, MD 02/23/2024 4:43 AM    XR Chest  AP Portable    Result Date: 02/23/2024  1.Mild cardiomegaly with diffuse prominence of the central vascular and interstitial lung markings, suggestive of pulmonary congestion. 2.Elevation of the left  hemidiaphragm with left basilar atelectasis. 3.Stomach remains enlarged and opacified with gas. Justus Radford, MD 02/23/2024 3:31 AM     CARDIAC:  EKG Results       ** No results found for the last 48 hours. **            Markers:  Recent Labs   Lab 02/23/24  0246   hs Troponin 10.4       ASSESSMENT & PLAN     Roy Krager Haider Hornaday. is a 88 y.o. male with a PMHx of DM1, peripheral neuropathy, glaucoma (diabetic proliferative eye disease), HTN, prostate cancer hx  who presents with abdominal pain    PLAN:  # Recurrent midgut volvulus  # Nausea, vomiting, abdominal pain  -NG tube in place  -General Surgery consulted, appreciate recommendations, likely surgery in the morning  -Half-normal saline 75 cc/h  -N.p.o.    # Flu a positive  # Acute hypoxemic respiratory failure  -Tamiflu   -Follow-up chest x-ray    # AKI  Likely prerenal in setting of dehydration.  Given 1 L NS in the ED  -Continuous fluids as above  -Check PVR, UA    # Hypertension  -Continue home olmesartan, HCTZ, metoprolol     # Type 1 diabetes  Takes insulin  glargine 27 units daily, lispro 15 units 3 times daily  -Reduce insulin  glargine to 20 units daily while n.p.o.  -Low-dose insulin  sliding scale    Diet: N.p.o.    DVT/VTE Prophylaxis:   Current Facility-Administered Medications (Includes Only Anticoagulants, Misc. Hematological)   Medication Dose Route Last Admin   None       Code Status: Prior    Plan of care discussed with ED physician Dr. Cheyenne  Will admit to inpatient  Labs and imaging reviewed by me    Signed,  Tyannah Sane, MD    Patient seen on: 02/23/2024 5:22 AM  Time Elapsed: 80 minutes               [1]   Past Medical History:  Diagnosis Date    Diabetes     Glaucoma     Hypertension    [2]   Past Surgical History:  Procedure Laterality Date    appendectomy 1991      LAPAROSCOPY, DIAGNOSTIC N/A 01/13/2024    Procedure: LAPAROSCOPY, DIAGNOSTIC, LYSIS OF ADHESIONS;  Surgeon: Soyla Dallas BROCKS, DO;  Location: [facility];  Service: General;  Laterality:  N/A;    LAPAROSCOPY, DIAGNOSTIC BARIATRIC  01/13/2024    Procedure: LAPAROSCOPY, REDUCTION OF VOLVULUS, REPAIR INTERNAL HERNIA;  Surgeon: Soyla Dallas BROCKS, DO;  Location: [facility];  Service: General;;   [3] No Known Allergies  [4]   Family History  Problem Relation Name Age of Onset    Colon cancer Father     [5]   Social History  Tobacco Use    Smoking status: Never    Smokeless tobacco: Never   Vaping Use    Vaping status: Never Used   Substance Use Topics    Alcohol use: Not Currently     Comment: 2 drinks a month    Drug use: No

## 2024-02-24 ENCOUNTER — Encounter: Payer: Self-pay | Admitting: Surgery

## 2024-02-24 DIAGNOSIS — I10 Essential (primary) hypertension: Secondary | ICD-10-CM

## 2024-02-24 DIAGNOSIS — E1129 Type 2 diabetes mellitus with other diabetic kidney complication: Secondary | ICD-10-CM

## 2024-02-24 DIAGNOSIS — J111 Influenza due to unidentified influenza virus with other respiratory manifestations: Secondary | ICD-10-CM

## 2024-02-24 DIAGNOSIS — K562 Volvulus: Secondary | ICD-10-CM

## 2024-02-24 LAB — CBC
Absolute nRBC: 0 10*3/uL (ref <=0.00)
Hematocrit: 33.6 % — ABNORMAL LOW (ref 37.6–49.6)
Hemoglobin: 10.9 g/dL — ABNORMAL LOW (ref 12.5–17.1)
MCH: 29.6 pg (ref 25.1–33.5)
MCHC: 32.4 g/dL (ref 31.5–35.8)
MCV: 91.3 fL (ref 78.0–96.0)
MPV: 10.7 fL (ref 8.9–12.5)
Platelet Count: 268 10*3/uL (ref 142–346)
RBC: 3.68 10*6/uL — ABNORMAL LOW (ref 4.20–5.90)
RDW: 15 % (ref 11–15)
WBC: 12.58 10*3/uL — ABNORMAL HIGH (ref 3.10–9.50)
nRBC %: 0 /100{WBCs} (ref <=0.0)

## 2024-02-24 LAB — WHOLE BLOOD GLUCOSE POCT
Whole Blood Glucose POCT: 281 mg/dL — ABNORMAL HIGH (ref 70–100)
Whole Blood Glucose POCT: 204 mg/dL — ABNORMAL HIGH (ref 70–100)
Whole Blood Glucose POCT: 164 mg/dL — ABNORMAL HIGH (ref 70–100)
Whole Blood Glucose POCT: 161 mg/dL — ABNORMAL HIGH (ref 70–100)
Whole Blood Glucose POCT: 164 mg/dL — ABNORMAL HIGH (ref 70–100)

## 2024-02-24 LAB — PHOSPHORUS: Phosphorus: 3.6 mg/dL (ref 2.3–4.7)

## 2024-02-24 LAB — BASIC METABOLIC PANEL
Anion Gap: 10 (ref 5.0–15.0)
BUN: 30 mg/dL — ABNORMAL HIGH (ref 9–28)
CO2: 21 meq/L (ref 17–29)
Calcium: 8.1 mg/dL (ref 7.9–10.2)
Chloride: 107 meq/L (ref 99–111)
Creatinine: 1.3 mg/dL (ref 0.5–1.5)
GFR: 53.8 mL/min/{1.73_m2} — ABNORMAL LOW (ref >=60.0)
Glucose: 277 mg/dL — ABNORMAL HIGH (ref 70–100)
Potassium: 4.8 meq/L (ref 3.5–5.3)
Sodium: 138 meq/L (ref 135–145)

## 2024-02-24 LAB — NT-PROBNP: NT-ProBNP: 703 pg/mL — ABNORMAL HIGH (ref <450)

## 2024-02-24 LAB — HEPATIC FUNCTION PANEL (LFT)
ALT: <6 U/L (ref <=55)
AST (SGOT): 13 U/L (ref <=41)
Albumin/Globulin Ratio: 1.1 (ref 0.9–2.2)
Albumin: 3 g/dL — ABNORMAL LOW (ref 3.5–5.0)
Alkaline Phosphatase: 125 U/L — ABNORMAL HIGH (ref 37–117)
Bilirubin Direct: 0.3 mg/dL (ref 0.0–0.5)
Bilirubin Indirect: 0.4 mg/dL (ref 0.2–1.0)
Bilirubin, Total: 0.7 mg/dL (ref 0.2–1.2)
Globulin: 2.8 g/dL (ref 2.0–3.6)
Protein, Total: 5.8 g/dL — ABNORMAL LOW (ref 6.0–8.3)

## 2024-02-24 LAB — MAGNESIUM: Magnesium: 1.7 mg/dL (ref 1.6–2.6)

## 2024-02-24 MED ORDER — LIDOCAINE 5 % EX PTCH
1.0000 | MEDICATED_PATCH | CUTANEOUS | Status: AC
Start: 2024-02-24 — End: ?
  Administered 2024-02-24 – 2024-02-27 (×4): 1 via TRANSDERMAL
  Filled 2024-02-24 (×4): qty 1

## 2024-02-24 MED ORDER — INSULIN GLARGINE 100 UNIT/ML SC SOLN
17.0000 [IU] | Freq: Every day | SUBCUTANEOUS | Status: AC
Start: 2024-02-25 — End: ?
  Administered 2024-02-25: 17 [IU] via SUBCUTANEOUS
  Filled 2024-02-24: qty 17

## 2024-02-24 MED ORDER — MORPHINE SULFATE 2 MG/ML IJ/IV SOLN (WRAP)
1.0000 mg | Status: AC | PRN
Start: 2024-02-24 — End: ?
  Administered 2024-02-24 – 2024-02-25 (×4): 1 mg via INTRAVENOUS
  Filled 2024-02-24 (×4): qty 1

## 2024-02-24 MED ORDER — INSULIN GLARGINE 100 UNIT/ML SC SOLN
5.0000 [IU] | Freq: Once | SUBCUTANEOUS | Status: AC
Start: 2024-02-24 — End: 2024-02-24
  Administered 2024-02-24: 5 [IU] via SUBCUTANEOUS
  Filled 2024-02-24: qty 5

## 2024-02-24 MED ORDER — LACTATED RINGERS IV SOLN
INTRAVENOUS | Status: AC
Start: 2024-02-24 — End: 2024-02-25

## 2024-02-24 MED ORDER — ACETAMINOPHEN 10 MG/ML IV SOLN
1000.0000 mg | Freq: Three times a day (TID) | INTRAVENOUS | Status: AC
Start: 2024-02-24 — End: 2024-02-25
  Administered 2024-02-24 – 2024-02-25 (×3): 1000 mg via INTRAVENOUS
  Filled 2024-02-24 (×3): qty 100

## 2024-02-24 MED ORDER — MORPHINE SULFATE 2 MG/ML IJ/IV SOLN (WRAP)
2.0000 mg | Status: AC | PRN
Start: 2024-02-24 — End: ?

## 2024-02-24 NOTE — Plan of Care (Signed)
 Problem: Pain interferes with ability to perform ADL  Goal: Pain at adequate level as identified by patient  Outcome: Progressing  Flowsheets (Taken 02/24/2024 0522)  Pain at adequate level as identified by patient:   Identify patient comfort function goal   Assess for risk of opioid induced respiratory depression, including snoring/sleep apnea. Alert healthcare team of risk factors identified.   Reassess pain within 30-60 minutes of any procedure/intervention, per Pain Assessment, Intervention, Reassessment (AIR) Cycle   Evaluate if patient comfort function goal is met   Offer non-pharmacological pain management interventions   Include patient/patient care companion in decisions related to pain management as needed   Consult/collaborate with Physical Therapy, Occupational Therapy, and/or Speech Therapy   Consult/collaborate with Pain Service   Evaluate patient's satisfaction with pain management progress   Assess pain on admission, during daily assessment and/or before any as needed intervention(s)     Problem: Moderate/High Fall Risk Score >5  Goal: Patient will remain free of falls  Outcome: Progressing  Flowsheets (Taken 02/23/2024 2300)  High (Greater than 13):   HIGH-Visual cue at entrance to patient's room   HIGH-Utilize chair pad alarm for patient while in the chair   HIGH-Bed alarm on at all times while patient in bed   HIGH-Apply yellow Fall Risk arm band   HIGH-Pharmacy to initiate evaluation and intervention per protocol   HIGH-Initiate use of floor mats as appropriate   HIGH-Consider use of low bed     Problem: Compromised Sensory Perception  Goal: Sensory Perception Interventions  Outcome: Progressing  Flowsheets (Taken 02/23/2024 2000)  Sensory Perception Interventions: Offload heels, Pad bony prominences, Reposition q 2hrs/turn Clock, Q2 hour skin assessment under devices if present     Problem: Compromised Moisture  Goal: Moisture level Interventions  Outcome: Progressing  Flowsheets (Taken 02/23/2024  2000)  Moisture level Interventions: Moisture wicking products, Moisture barrier cream     Problem: Compromised Activity/Mobility  Goal: Activity/Mobility Interventions  Outcome: Progressing  Flowsheets (Taken 02/23/2024 2000)  Activity/Mobility Interventions: Pad bony prominences, TAP Seated positioning system when OOB, Promote PMP, Reposition q 2 hrs / turn clock, Offload heels     Problem: Compromised Nutrition  Goal: Nutrition Interventions  Outcome: Progressing  Flowsheets (Taken 02/23/2024 2000)  Nutrition Interventions: Discuss nutrition at Rounds, I&Os, Document % meal eaten, Daily weights     Problem: Compromised Friction/Shear  Goal: Friction and Shear Interventions  Outcome: Progressing  Flowsheets (Taken 02/23/2024 2000)  Friction and Shear Interventions: Pad bony prominences, Off load heels, HOB 30 degrees or less unless contraindicated, Consider: TAP seated positioning, Heel foams     Problem: Safety  Goal: Patient will be free from injury during hospitalization  Outcome: Progressing  Flowsheets (Taken 02/24/2024 0522)  Patient will be free from injury during hospitalization:   Provide alternative method of communication if needed (communication boards, writing)   Assess for patients risk for elopement and implement Elopement Risk Plan per policy   Include patient/ family/ care giver in decisions related to safety   Use appropriate transfer methods   Assess patient's risk for falls and implement fall prevention plan of care per policy   Ensure appropriate safety devices are available at the bedside   Provide and maintain safe environment   Hourly rounding     Problem: Altered GI Function  Goal: Fluid and electrolyte balance are achieved/maintained  Outcome: Progressing  Flowsheets (Taken 02/24/2024 0522)  Fluid and electrolyte balance are achieved/maintained:   Observe for cardiac arrhythmias   Monitor for muscle  weakness   Monitor/assess lab values and report abnormal values   Assess and reassess fluid and  electrolyte status

## 2024-02-24 NOTE — Plan of Care (Signed)
 Pt has had NG tube clamped 0800-1200, suction 1200-1300, and clamped again since 1300. During suction 0ml was suctioned out. Pt had complaints of pain this morning, lidocaine  patch and ofirmev  were ordered, since then pt has not had c/o pain. Daughter in the room. VSS, incisional site healing WNL. Bedside table, call bell, and phone all within reach.       Problem: Pain interferes with ability to perform ADL  Goal: Pain at adequate level as identified by patient  Outcome: Progressing     Problem: Side Effects from Pain Analgesia  Goal: Patient will experience minimal side effects of analgesic therapy  Outcome: Progressing     Problem: Moderate/High Fall Risk Score >5  Goal: Patient will remain free of falls  Outcome: Progressing     Problem: Compromised Sensory Perception  Goal: Sensory Perception Interventions  Outcome: Progressing     Problem: Compromised Moisture  Goal: Moisture level Interventions  Outcome: Progressing     Problem: Compromised Activity/Mobility  Goal: Activity/Mobility Interventions  Outcome: Progressing     Problem: Compromised Nutrition  Goal: Nutrition Interventions  Outcome: Progressing     Problem: Compromised Friction/Shear  Goal: Friction and Shear Interventions  Outcome: Progressing     Problem: Safety  Goal: Patient will be free from injury during hospitalization  Outcome: Progressing     Problem: Altered GI Function  Goal: Fluid and electrolyte balance are achieved/maintained  Outcome: Progressing

## 2024-02-24 NOTE — Progress Notes (Signed)
 POST OPERATIVE PROGRESS NOTE  [facility]    Date Time: 02/24/24 8:09 AM  Patient Name: Roy Delgado.    ASSESSMENT:   1 Day Post-Op S/P Procedure(s):  DIAGNOSTIC LAPAROSCOPY, EXPLORATORY LAPAROTOMY, LYSIS OF ADHESIONS, REDUCTION OF INTERNAL HERNIA (50679, 49400, 44050, 44005)  - influenza A  - urinary retention    47M admitted for abdominal pain and n/v comparable to episode in January when he was found to have SBO with SB volvulus s/p dx lap and adhesiolysis. Imaging on admission concerning for persistent volvulus, now s/p diagnostic converted to exploratory laparotomy with reduction of internal hernia and adhesiolysis. Doing well today, AROBF    PLAN:   - NPO, mIVF  - NGT LCWS. Will do clamp trials today. Clamp NGT 8am-12, reattach to suction and record output after 1hr. Message us  with the number. Repeat 1pm-4pm.   - no abx indicated  - ok for DVT ppx  - recommend IV medication alternatives when possible    Attending Note:    Doing well. Having incisional pain. No N/V. NGT output overnight 4-500.   Abd soft, ND, appropriately tender  NGT Clamp trial today    I, Emery Bonnet , MD, have personally seen and examined this patient and have participated in their care. I agree with all clinical information, including the physical exam, patient history, and planning as documented by the resident or edited the note as needed. All notes, labs, and imaging were reviewed. The patient was seen on the date the note was written.       Emery Bonnet, MD  [facility]  [phone]      SUBJECTIVE:   The patient is doing well overall. Pain controlled. Reports traumatic foley insertion yesterday and is now hesitant to have it removed. Denies n/v. -g/bm     OBJECTIVE:   Current Vitals:   Vitals:    02/24/24 0344   BP: 131/79   Pulse: 78   Resp: 18   Temp: 98.2 F (36.8 C)   SpO2: 98%       Intake and Output Summary (Last 24 hours):  I/O last 3 completed shifts:  In: 2030 [I.V.:1600;  NG/GT:180; IV Piggyback:250]  Out: 3120 [Urine:550; Emesis/NG output:2555; Blood:15]    Labs:     Results       Procedure Component Value Units Date/Time    Phosphorus [[id number]]  (Normal) Collected: 02/24/24 0316    Specimen: Blood, Venous Updated: 02/24/24 0345     Phosphorus 3.6 mg/dL     Hepatic Function Panel (LFT) [[id number]]  (Abnormal) Collected: 02/24/24 0316    Specimen: Blood, Venous Updated: 02/24/24 0345     Bilirubin, Total 0.7 mg/dL      Bilirubin Direct 0.3 mg/dL      Bilirubin Indirect 0.4 mg/dL      AST (SGOT) 13 U/L      ALT <6 U/L      Alkaline Phosphatase 125 U/L      Albumin  3.0 g/dL      Globulin 2.8 g/dL      Albumin /Globulin Ratio 1.1     Protein, Total 5.8 g/dL     Basic Metabolic Panel [[id number]]  (Abnormal) Collected: 02/24/24 0316    Specimen: Blood, Venous Updated: 02/24/24 0345     Glucose 277 mg/dL      BUN 30 mg/dL      Creatinine 1.3 mg/dL      Calcium 8.1 mg/dL      Sodium 861 mEq/L  Potassium 4.8 mEq/L      Chloride 107 mEq/L      CO2 21 mEq/L      Anion Gap 10.0     GFR 53.8 mL/min/1.73 m2     Magnesium  [[id number]]  (Normal) Collected: 02/24/24 0316    Specimen: Blood, Venous Updated: 02/24/24 0345     Magnesium  1.7 mg/dL     NT-ProBNP [[id number]]  (Abnormal) Collected: 02/24/24 0316    Specimen: Blood, Venous Updated: 02/24/24 0342     NT-ProBNP 703 pg/mL     CBC without Differential [[id number]]  (Abnormal) Collected: 02/24/24 0316    Specimen: Blood, Venous Updated: 02/24/24 0322     WBC 12.58 x10 3/uL      Hemoglobin 10.9 g/dL      Hematocrit 66.3 %      Platelet Count 268 x10 3/uL      MPV 10.7 fL      RBC 3.68 x10 6/uL      MCV 91.3 fL      MCH 29.6 pg      MCHC 32.4 g/dL      RDW 15 %      nRBC % 0.0 /100 WBC      Absolute nRBC 0.00 x10 3/uL     Whole Blood Glucose POCT [[id number]]  (Abnormal) Collected: 02/23/24 2155    Specimen: Blood, Capillary Updated: 02/23/24 2208     Whole Blood Glucose POCT 239 mg/dL     Whole Blood Glucose POCT [[id number]]  (Abnormal)  Collected: 02/23/24 2029    Specimen: Blood, Capillary Updated: 02/23/24 2030     Whole Blood Glucose POCT 247 mg/dL     Whole Blood Glucose POCT [[id number]]  (Abnormal) Collected: 02/23/24 1615    Specimen: Blood, Capillary Updated: 02/23/24 1619     Whole Blood Glucose POCT 235 mg/dL     Whole Blood Glucose POCT [[id number]]  (Abnormal) Collected: 02/23/24 1035    Specimen: Blood, Capillary Updated: 02/23/24 1415     Whole Blood Glucose POCT 187 mg/dL     Whole Blood Glucose POCT [[id number]]  (Abnormal) Collected: 02/23/24 1308    Specimen: Blood, Capillary Updated: 02/23/24 1317     Whole Blood Glucose POCT 195 mg/dL             Rads:     Radiology Results (24 Hour)       Procedure Component Value Units Date/Time    Chest AP Portable [[id number]] Collected: 02/23/24 0943    Order Status: Completed Updated: 02/23/24 0947    Narrative:      HISTORY: NG tube placement     COMPARISON: Study performed earlier the same day    FINDINGS:   NG tube is in stomach. The stomach is moderately distended with air.  Cardiac mediastinal silhouette is stable. No pulmonary edema. Elevated left  hemidiaphragm. Mild interstitial prominence, not significantly changed.  Bibasilar atelectasis. No effusions are seen.      Impression:        1.NG tube in the stomach.  2.Bibasilar atelectasis.    Reena D'Heureux, MD  02/23/2024 9:45 AM            Physical Exam:     Mental status - alert, oriented to person, place, and time  Chest - no tachypnea, retractions or cyanosis  Heart - normal rate, regular rhythm  Abdomen - soft, mildly distended, minimally  tender, incisions cdi with dermabond  Extremities - no pedal edema noted      Signed by: Sherrilee  SHAUNNA Officer, MD

## 2024-02-24 NOTE — Op Note (Signed)
 FULL OPERATIVE NOTE    Date Time: 02/24/24 2:49 PM  Patient Name: Roy Delgado, Roy Delgado (MRN: [id number])  Attending Physician: Cherisse Wyatt BRAVO, MD      Date of Operation:   02/23/2024    Providers Performing:   Surgeons and Role:     DEWAINE Herb Re, MD - Primary     * Ethel Sherrilee SQUIBB, MD - Resident - Assisting    Surgical First Assistant(s):   None    Operative Procedure:   Diagnostic Laparoscopy converted to Exploratory Laparotomy, Extensive Lysis of Adhesions, Reduction of Internal Hernia 50999 (CPT), 44050 (CPT)     Preoperative Diagnosis:   Pre-Op Diagnosis Codes:      * Small bowel obstruction [K56.609]    Postoperative Diagnosis:   Post-Op Diagnosis Codes:     * Small bowel obstruction [K56.609]  Internal hernia with small bowel obstruction, extensive intra-abdominal adhesions    Anesthesia:   general    Findings:   Converted to laparotomy due to extensive adhesions and limited working space. Internal hernia formed from adhesions from omentum to small bowel as well as interloop small bowel adhesions. All adhesions from ligament of treitz to about mid ileum lysed with resolution of obstruction. Distal small bowel densely adherent to abdominal wall left alone as not causing obstruction.     Indications:   86M with small bowel obstruction secondary to suspected volvulus on CT imaging.     Operative Notes:   After confirmation of appropriate IV antibiotics and placement of bilateral lower extremity sequential compression devices, the patient was placed in the supine position and intubated by Anesthesia without difficulty. The abdomen was prepped and draped in the usual sterile fashion.    A Veress needle was placed in the left upper quadrant at Palmer's point, and pneumoperitoneum was established to 15 mmHg. A 5 mm Optiview trocar was then inserted into the left upper quadrant, and the laparoscope was introduced. Upon initial survey of the abdominal cavity, extensive adhesions were encountered throughout,  with the small bowel noted to be adherent to the anterior abdominal wall inferiorly near the umbilicus. Under direct vision, two additional 5 mm trocars were placed in the mid-abdomen and right upper quadrant in areas free of adhesions.    Attention was then directed to the suspected area of obstruction as indicated on preoperative imaging, which was in the proximal jejunum, with concern for a possible twist of the mesentery, volvulus, or internal hernia. The transverse colon was elevated to identify the ligament of Treitz, at which point dense omental adhesions to the small bowel were noted, significantly limiting mobilization. Initial attempts were made to release these adhesions using the Ligasure; however, due to their density and the restricted working space, a decision was made to convert to an open approach for safe and complete adhesiolysis and evaluation.    A midline laparotomy incision was made in the upper abdomen, taking care to avoid the inferior region where the small bowel was adherent to the anterior abdominal wall. Upon entering the abdomen, tedious adhesiolysis was performed using a combination of Metzenbaum scissors, Ligasure, and blunt dissection as indicated. The adhesions were dense and extensive, requiring meticulous technique to avoid bowel injury. During the process, a serosal injury occurred, which was immediately repaired using 3-0 Vicryl.    As the adhesiolysis proceeded, the source of obstruction was identified. An internal hernia was found, formed by the omentum and the small bowel mesentery, through which a loop of proximal small bowel had become  incarcerated. This was carefully reduced, immediately relieving the obstruction. Once the small bowel was freed, attention was turned to ensuring no additional adhesions were contributing to mechanical obstruction. Complete lysis was performed from the ligament of Treitz distally to a portion of distal small bowel that remained densely  adhered to the abdominal wall. Given that this segment was not contributing to the obstruction and further dissection carried a high risk of enterotomy, it was left intact.    Once all necessary adhesions were lysed, the bowel was carefully run from the ligament of Treitz distally to ensure no missed injuries or bleeding. The abdomen was then copiously irrigated, and Seprafilm was placed to minimize future adhesion formation. Fascia was closed with a running 1 PDS suture from each end, and the skin was approximated using 3-0 Vicryl and Monocryl, followed by Dermabond skin glue for additional reinforcement.    Instrument, sponge, and needle counts were correct at the conclusion of the case. The patient tolerated the procedure well and was transferred to the recovery unit in stable condition.              Estimated Blood Loss:   15 mL    Implants:     Implant Name Type Inv. Item Serial No. Model No. Manufacturer Lot No. LRB No. Used Action   BARRIER ADH SOD HALUR CMC SPRFLM 6X5IN - [id number] Sheet BARRIER ADH SOD HALUR CMC SPRFLM 6X5IN  [id number] BAXTER [id number] N/A 1 Implanted          Drains:   Drains: no      Specimens:   * No specimens in log *      Complications:   None    Signed by: Emery Bonnet, MD  [facility]

## 2024-02-24 NOTE — Progress Notes (Signed)
 [facility]  PROGRESS NOTE      Patient: Roy Delgado.  Date: 02/24/2024   LOS: 1 Days  Admission Date: 02/23/2024   MRN: [id number]  Attending: Wyatt FORBES Snow, MD  Please contact me on Epic secure chat        ASSESSMENT/PLAN     Roy Flannery Arthor Gorter. is a 88 y.o. male w/ pmhx of DMT2, HTN, internal hernia, recent admission for internal hernia causing bowel obstruction (2/2 adhesive tissue), now s/p diagnostic laparoscopy with LOA and reduction of volvulus on 01/13/2024 admitted for recurrent midgut volvulus s/p diagnostic laparoscopy with LOA and reduction of internal hernia on 3/2.     # Recurrent midgut volvulus  # Nausea, vomiting, abdominal pain  -S/p diagnostic laparoscopy with LOA and reduction of internal hernia on 3/2  -Discussed with surgery, n.p.o., continue maintenance IV fluids, plan for NGT clamp trial today  -N.p.o  -Pain control with IV Tylenol , as needed oxycodone  and IV Dilaudid      # Flu a positive  # Acute hypoxemic respiratory failure  -Per daughter patient took 4 doses of Tamiflu  at home  -CXR with interstitial lung markings concerning for pulmonary congestion  -Follow-up NT-proBNP  -Echo 12/19/2016  notable for LVEF 55 to 60%,     #CKD  Given 1 L NS in the ED. Creatinine is now stable  -Continuous fluids as above  -daily cr     # Hypertension  -Family patient is taking olmesartan and losartan  at home.  Will stop olmesartan and only continue losartan .  Patient is currently taking 25 mg twice daily, will consolidate dose to 50 mg once daily  -Continue losartan  50 mg once daily  -Continue metoprolol      # Type 2 diabetes  Takes insulin  glargine 27 units daily, lispro 15 units 3 times daily  -Reduce insulin  glargine to 10 units daily while n.p.o.  -Low-dose insulin  sliding scale    #Allergies  -continue montelukast  and formulary antihistamine    #Mood disorder  -continue sertraline     #Neuropathy  -continue lyrica       Nutrition: N.p.o.  Code: Full  DVT prophylaxis:   Current  Facility-Administered Medications (Includes Only Anticoagulants, Misc. Hematological)   Medication Dose Route Last Admin    enoxaparin  (LOVENOX ) syringe 40 mg  40 mg Subcutaneous 40 mg at 02/24/24 9148     Disposition:   50 minutes spent in care and evaluation of patient.              Signed,  Wyatt FORBES Snow, MD  3:05 PM 02/24/2024   Time signed does not reflect time patient seen.     SUBJECTIVE   Patient reports he feels okay.  Denies nausea or vomiting    OBJECTIVE     Vitals:    02/24/24 1305   BP:    Pulse: 65   Resp:    Temp:    SpO2: 93%       Temperature: Temp  Min: 97.3 F (36.3 C)  Max: 98.2 F (36.8 C)  Pulse: Pulse  Min: 65  Max: 108  Respiratory: Resp  Min: 16  Max: 18  Non-Invasive BP: BP  Min: 118/61  Max: 150/78  Pulse Oximetry SpO2  Min: 93 %  Max: 100 %    Intake and Output Summary (Last 24 hours) at Date Time    Intake/Output Summary (Last 24 hours) at 02/24/2024 1505  Last data filed at 02/24/2024 1305  Gross per 24 hour  Intake 180 ml   Output 1600 ml   Net -1420 ml          Physical Examination:    General:  No acute distress   HEENT: Normocephalic, atraumatic, extraocular movements intact, normal conjunctivae. No scleral icterus.  NG tube in place draining dark brown fluid   cardiovascular: Normal rate  Respiratory: Normal respiratory rate. s.   Gastrointestinal: soft, non-tender, non-distended.   Skin: Dry. No rashes, jaundice or other lesions.   Neurologic: AAOx3.  Face symmetric.  CN 2-12 grossly intact.     MEDICATIONS     Current Facility-Administered Medications   Medication Dose Route Frequency    acetaminophen   1,000 mg Intravenous Q8H    cetirizine   5 mg Oral Daily    dorzolamide -timolol   1 drop Both Eyes Q12H [facility]    enoxaparin   40 mg Subcutaneous Daily    insulin  glargine  10 Units Subcutaneous Daily    insulin  lispro  1-3 Units Subcutaneous QHS    insulin  lispro  1-5 Units Subcutaneous TID AC    lidocaine   1 patch Transdermal Q24H    losartan   50 mg Oral Daily    metoprolol  succinate  XL  25 mg Oral Daily    montelukast   10 mg Oral QHS    oseltamivir   30 mg Oral Q12H [facility]    pregabalin   100 mg Oral QHS    sertraline   25 mg Oral Daily    sodium chloride   500 mL Intravenous Once    tamsulosin   0.4 mg Oral Daily after dinner            LABS/IMAGING     Recent Labs   Lab 02/24/24  0316 02/23/24  0246   WBC 12.58* 6.04   RBC 3.68* 4.55   Hemoglobin 10.9* 13.2   Hematocrit 33.6* 40.6   MCV 91.3 89.2   Platelet Count 268 351*       Recent Labs   Lab 02/24/24  0316 02/23/24  0246   Sodium 138 139   Potassium 4.8 5.3   Chloride 107 103   CO2 21 25   BUN 30* 27   Creatinine 1.3 1.3   Glucose 277* 157*   Calcium 8.1 9.4   Magnesium  1.7  --        Recent Labs   Lab 02/24/24  0316 02/23/24  0246   ALT <6 12   AST (SGOT) 13 25   Bilirubin, Total 0.7 0.5   Bilirubin Direct 0.3  --    Albumin  3.0* 4.0   Alkaline Phosphatase 125* 162*       Recent Labs   Lab 02/23/24  0246   hs Troponin 10.4             Microbiology Results (last 15 days)       Procedure Component Value Units Date/Time    COVID-19 and Influenza ([place]) (symptomatic) [[id number]]  (Abnormal) Collected: 02/23/24 0325    Order Status: Completed Specimen: Swab from Anterior Nares Updated: 02/23/24 0401     SARS-CoV-2 (COVID-19) RNA Not Detected     Influenza A RNA Detected     Influenza B RNA Not Detected    Narrative:      A result of Detected indicates POSITIVE for the presence of viral RNA  A result of Not Detected indicates NEGATIVE for the presence of viral RNA    Test performed using the Roche cobas [place] SARS-CoV-2 & Influenza A/B assay. This is a multiplex real-time RT-PCR  assay for the detection of SARS-CoV-2, influenza A, and influenza B virus RNA. Viral nucleic acids may persist in vivo, independent of viability. Detection of viral nucleic acid does not imply the presence of infectious virus, or that virus nucleic acid is the cause of clinical symptoms. Negative results do not preclude SARS-CoV-2, influenza A, and/or influenza B infection  and should not be used as the sole basis for diagnosis, treatment or other patient management decisions. Invalid results may be due to inhibiting substances in the specimen and recollection should occur.     Culture, Blood, Aerobic And Anaerobic [[id number]] Collected: 02/23/24 0246    Order Status: Completed Specimen: Blood, Venous Updated: 02/24/24 1000     Culture Blood No growth at 1 day             Chest AP Portable    Result Date: 02/23/2024  1.NG tube in the stomach. 2.Bibasilar atelectasis. Reena D'Heureux, MD 02/23/2024 9:45 AM    CT Abd/Pelvis with IV Contrast    Result Date: 02/23/2024  1.Persistent dilation of the stomach, duodenum, and proximal jejunum with findings suggestive of midgut volvulus. 2.Chronic renal atrophy and scarring. 3.Nonobstructing left renal stones. Justus Radford, MD 02/23/2024 4:43 AM    XR Chest  AP Portable    Result Date: 02/23/2024  1.Mild cardiomegaly with diffuse prominence of the central vascular and interstitial lung markings, suggestive of pulmonary congestion. 2.Elevation of the left hemidiaphragm with left basilar atelectasis. 3.Stomach remains enlarged and opacified with gas. Justus Radford, MD 02/23/2024 3:31 AM     Echo Results       None            No results found for this or any previous visit.

## 2024-02-25 LAB — WHOLE BLOOD GLUCOSE POCT
Whole Blood Glucose POCT: 121 mg/dL — ABNORMAL HIGH (ref 70–100)
Whole Blood Glucose POCT: 116 mg/dL — ABNORMAL HIGH (ref 70–100)
Whole Blood Glucose POCT: 32 mg/dL — CL (ref 70–100)
Whole Blood Glucose POCT: 35 mg/dL — CL (ref 70–100)
Whole Blood Glucose POCT: 79 mg/dL (ref 70–100)
Whole Blood Glucose POCT: 110 mg/dL — ABNORMAL HIGH (ref 70–100)
Whole Blood Glucose POCT: 152 mg/dL — ABNORMAL HIGH (ref 70–100)

## 2024-02-25 LAB — BASIC METABOLIC PANEL
Anion Gap: 8 (ref 5.0–15.0)
BUN: 29 mg/dL — ABNORMAL HIGH (ref 9–28)
CO2: 26 meq/L (ref 17–29)
Calcium: 8.3 mg/dL (ref 7.9–10.2)
Chloride: 105 meq/L (ref 99–111)
Creatinine: 1.3 mg/dL (ref 0.5–1.5)
GFR: 53.8 mL/min/{1.73_m2} — ABNORMAL LOW (ref >=60.0)
Glucose: 135 mg/dL — ABNORMAL HIGH (ref 70–100)
Potassium: 3.9 meq/L (ref 3.5–5.3)
Sodium: 139 meq/L (ref 135–145)

## 2024-02-25 LAB — CBC
Absolute nRBC: 0 10*3/uL (ref <=0.00)
Hematocrit: 31 % — ABNORMAL LOW (ref 37.6–49.6)
Hemoglobin: 9.7 g/dL — ABNORMAL LOW (ref 12.5–17.1)
MCH: 28.4 pg (ref 25.1–33.5)
MCHC: 31.3 g/dL — ABNORMAL LOW (ref 31.5–35.8)
MCV: 90.9 fL (ref 78.0–96.0)
MPV: 10.8 fL (ref 8.9–12.5)
Platelet Count: 255 10*3/uL (ref 142–346)
RBC: 3.41 10*6/uL — ABNORMAL LOW (ref 4.20–5.90)
RDW: 15 % (ref 11–15)
WBC: 7.03 10*3/uL (ref 3.10–9.50)
nRBC %: 0 /100{WBCs} (ref <=0.0)

## 2024-02-25 LAB — MAGNESIUM: Magnesium: 2.2 mg/dL (ref 1.6–2.6)

## 2024-02-25 MED ORDER — DEXTROSE-SODIUM CHLORIDE 5-0.45 % IV SOLN
INTRAVENOUS | Status: DC
Start: 2024-02-25 — End: 2024-02-26

## 2024-02-25 MED ORDER — LACTATED RINGERS IV SOLN
INTRAVENOUS | Status: DC
Start: 2024-02-25 — End: 2024-02-25

## 2024-02-25 MED ORDER — AMLODIPINE BESYLATE 5 MG PO TABS
5.0000 mg | ORAL_TABLET | Freq: Every day | ORAL | Status: DC
Start: 2024-02-25 — End: 2024-02-27
  Administered 2024-02-25 – 2024-02-27 (×3): 5 mg via ORAL
  Filled 2024-02-25 (×3): qty 1

## 2024-02-25 MED ORDER — PANTOPRAZOLE SODIUM 40 MG IV SOLR
40.0000 mg | Freq: Every day | INTRAVENOUS | Status: DC
Start: 2024-02-25 — End: 2024-02-27
  Administered 2024-02-25 – 2024-02-27 (×3): 40 mg via INTRAVENOUS
  Filled 2024-02-25 (×3): qty 40

## 2024-02-25 NOTE — Plan of Care (Signed)
 Problem: Pain interferes with ability to perform ADL  Goal: Pain at adequate level as identified by patient  Outcome: Progressing     Problem: Side Effects from Pain Analgesia  Goal: Patient will experience minimal side effects of analgesic therapy  Outcome: Progressing     Problem: Moderate/High Fall Risk Score >5  Goal: Patient will remain free of falls  Outcome: Progressing     Problem: Compromised Sensory Perception  Goal: Sensory Perception Interventions  Outcome: Progressing     Problem: Compromised Moisture  Goal: Moisture level Interventions  Outcome: Progressing     Problem: Compromised Activity/Mobility  Goal: Activity/Mobility Interventions  Outcome: Progressing     Problem: Compromised Nutrition  Goal: Nutrition Interventions  Outcome: Progressing     Problem: Compromised Friction/Shear  Goal: Friction and Shear Interventions  Outcome: Progressing     Problem: Safety  Goal: Patient will be free from injury during hospitalization  Outcome: Progressing     Problem: Altered GI Function  Goal: Fluid and electrolyte balance are achieved/maintained  Outcome: Progressing

## 2024-02-25 NOTE — Progress Notes (Signed)
 [facility]PITALIST  PROGRESS NOTE      Patient: Roy Delgado.  Date: 02/25/2024   LOS: 2 Days  Admission Date: 02/23/2024   MRN: [id number]  Attending: Wyatt FORBES Snow, MD  Please contact me on Epic secure chat        ASSESSMENT/PLAN     Jack Bolio Fountain Derusha. is a 88 y.o. male w/ pmhx of DMT2, HTN, internal hernia, recent admission for internal hernia causing bowel obstruction (2/2 adhesive tissue), now s/p diagnostic laparoscopy with LOA and reduction of volvulus on 01/13/2024 admitted for recurrent midgut volvulus s/p diagnostic laparoscopy with LOA and reduction of internal hernia on 3/2.     # Recurrent midgut volvulus  # Nausea, vomiting, abdominal pain  -S/p diagnostic laparoscopy with LOA and reduction of internal hernia on 3/2  -Discussed with surgery,NGT removed, ok to advance to [facility]  -Pain control with IV Tylenol , as needed oxycodone  and IV Dilaudid      # Flu a positive  # Acute hypoxemic respiratory failure  -Per daughter patient took 4 doses of Tamiflu  at home  -Continue tamiflu  to complete 5 day course  -CXR with interstitial lung markings concerning for pulmonary congestion  -NT-proBNP 703  -Echo 12/19/2016  notable for LVEF 55 to 60%,     #CKD  Given 1 L NS in the ED. Creatinine is now stable  -switch continuous fluids to d5hafns given hypoglycemia  -daily cr     # Hypertension  -Family patient is taking olmesartan and losartan  at home.  Will stop olmesartan and only continue losartan .  Patient is currently taking 25 mg twice daily, will consolidate dose to 50 mg once daily  -Continue losartan  50 mg once daily  -Continue metoprolol   -start amlodipine  5mg  daily given persistent htn     # Type 2 diabetes  # Hyperglycemia  # Hypoglycemia  Takes insulin  glargine 27 units daily, lispro 15 units 3 times daily  -pt hypoglycemic to 32 this afternoon, improved to 70 with hypoglycemia protocol  -start d5halfns at 100cc/hr  -stop scheduled lantus , continue ldssi and blood glucose checks with meals and at night  time      #Allergies  -continue montelukast  and formulary antihistamine    #Mood disorder  -continue sertraline     #Neuropathy  -continue lyrica       Nutrition: CLD  Code: Full  DVT prophylaxis:   Current Facility-Administered Medications (Includes Only Anticoagulants, Misc. Hematological)   Medication Dose Route Last Admin    enoxaparin  (LOVENOX ) syringe 40 mg  40 mg Subcutaneous 40 mg at 02/25/24 9057     Disposition:   50 minutes spent in care and evaluation of patient.              Signed,  Wyatt FORBES Snow, MD  3:46 PM 02/25/2024   Time signed does not reflect time patient seen.     SUBJECTIVE   Patient reports he feels okay.  Denies nausea or vomiting    OBJECTIVE     Vitals:    02/25/24 1502   BP: (!) 173/92   Pulse: 99   Resp:    Temp: 97.7 F (36.5 C)   SpO2: 92%       Temperature: Temp  Min: 97.7 F (36.5 C)  Max: 98.6 F (37 C)  Pulse: Pulse  Min: 65  Max: 99  Respiratory: Resp  Min: 17  Max: 18  Non-Invasive BP: BP  Min: 159/84  Max: 173/92  Pulse Oximetry SpO2  Min: 92 %  Max: 96 %    Intake and Output Summary (Last 24 hours) at Date Time    Intake/Output Summary (Last 24 hours) at 02/25/2024 1546  Last data filed at 02/25/2024 0941  Gross per 24 hour   Intake 140 ml   Output 1560 ml   Net -1420 ml          Physical Examination:    General:  No acute distress   HEENT: Normocephalic, atraumatic, extraocular movements intact, normal conjunctivae. No scleral icterus.  NG tube in place draining dark brown fluid   cardiovascular: Normal rate  Respiratory: Normal respiratory rate. s.   Gastrointestinal: soft, non-tender, non-distended.   Skin: Dry. No rashes, jaundice or other lesions.   Neurologic: AAOx3.  Face symmetric.  CN 2-12 grossly intact.     MEDICATIONS     Current Facility-Administered Medications   Medication Dose Route Frequency    amLODIPine   5 mg Oral Daily    cetirizine   5 mg Oral Daily    dorzolamide -timolol   1 drop Both Eyes Q12H [facility]    enoxaparin   40 mg Subcutaneous Daily    insulin  lispro   1-3 Units Subcutaneous QHS    insulin  lispro  1-5 Units Subcutaneous TID AC    lidocaine   1 patch Transdermal Q24H    losartan   50 mg Oral Daily    metoprolol  succinate XL  25 mg Oral Daily    montelukast   10 mg Oral QHS    oseltamivir   30 mg Oral Q12H [facility]    pantoprazole   40 mg Intravenous Daily    pregabalin   100 mg Oral QHS    sertraline   25 mg Oral Daily    sodium chloride   500 mL Intravenous Once    tamsulosin   0.4 mg Oral Daily after dinner            LABS/IMAGING     Recent Labs   Lab 02/25/24  0451 02/24/24  0316 02/23/24  0246   WBC 7.03 12.58* 6.04   RBC 3.41* 3.68* 4.55   Hemoglobin 9.7* 10.9* 13.2   Hematocrit 31.0* 33.6* 40.6   MCV 90.9 91.3 89.2   Platelet Count 255 268 351*       Recent Labs   Lab 02/25/24  0451 02/24/24  0316 02/23/24  0246   Sodium 139 138 139   Potassium 3.9 4.8 5.3   Chloride 105 107 103   CO2 26 21 25    BUN 29* 30* 27   Creatinine 1.3 1.3 1.3   Glucose 135* 277* 157*   Calcium 8.3 8.1 9.4   Magnesium  2.2 1.7  --        Recent Labs   Lab 02/24/24  0316 02/23/24  0246   ALT <6 12   AST (SGOT) 13 25   Bilirubin, Total 0.7 0.5   Bilirubin Direct 0.3  --    Albumin  3.0* 4.0   Alkaline Phosphatase 125* 162*       Recent Labs   Lab 02/23/24  0246   hs Troponin 10.4             Microbiology Results (last 15 days)       Procedure Component Value Units Date/Time    COVID-19 and Influenza ([place]) (symptomatic) [[id number]]  (Abnormal) Collected: 02/23/24 0325    Order Status: Completed Specimen: Swab from Anterior Nares Updated: 02/23/24 0401     SARS-CoV-2 (COVID-19) RNA Not Detected     Influenza A RNA Detected  Influenza B RNA Not Detected    Narrative:      A result of Detected indicates POSITIVE for the presence of viral RNA  A result of Not Detected indicates NEGATIVE for the presence of viral RNA    Test performed using the Roche cobas [place] SARS-CoV-2 & Influenza A/B assay. This is a multiplex real-time RT-PCR assay for the detection of SARS-CoV-2, influenza A, and influenza B  virus RNA. Viral nucleic acids may persist in vivo, independent of viability. Detection of viral nucleic acid does not imply the presence of infectious virus, or that virus nucleic acid is the cause of clinical symptoms. Negative results do not preclude SARS-CoV-2, influenza A, and/or influenza B infection and should not be used as the sole basis for diagnosis, treatment or other patient management decisions. Invalid results may be due to inhibiting substances in the specimen and recollection should occur.     Culture, Blood, Aerobic And Anaerobic [[id number]] Collected: 02/23/24 0246    Order Status: Completed Specimen: Blood, Venous Updated: 02/25/24 1000     Culture Blood No growth at 2 days             Chest AP Portable    Result Date: 02/23/2024  1.NG tube in the stomach. 2.Bibasilar atelectasis. Reena D'Heureux, MD 02/23/2024 9:45 AM    CT Abd/Pelvis with IV Contrast    Result Date: 02/23/2024  1.Persistent dilation of the stomach, duodenum, and proximal jejunum with findings suggestive of midgut volvulus. 2.Chronic renal atrophy and scarring. 3.Nonobstructing left renal stones. Justus Radford, MD 02/23/2024 4:43 AM    XR Chest  AP Portable    Result Date: 02/23/2024  1.Mild cardiomegaly with diffuse prominence of the central vascular and interstitial lung markings, suggestive of pulmonary congestion. 2.Elevation of the left hemidiaphragm with left basilar atelectasis. 3.Stomach remains enlarged and opacified with gas. Justus Radford, MD 02/23/2024 3:31 AM     Echo Results       None            No results found for this or any previous visit.

## 2024-02-25 NOTE — Plan of Care (Signed)
 Problem: Pain interferes with ability to perform ADL  Goal: Pain at adequate level as identified by patient  Outcome: Progressing  Flowsheets (Taken 02/25/2024 0301)  Pain at adequate level as identified by patient:   Assess for risk of opioid induced respiratory depression, including snoring/sleep apnea. Alert healthcare team of risk factors identified.   Assess pain on admission, during daily assessment and/or before any as needed intervention(s)   Reassess pain within 30-60 minutes of any procedure/intervention, per Pain Assessment, Intervention, Reassessment (AIR) Cycle   Evaluate if patient comfort function goal is met   Offer non-pharmacological pain management interventions   Consult/collaborate with Physical Therapy, Occupational Therapy, and/or Speech Therapy   Evaluate patient's satisfaction with pain management progress     Problem: Moderate/High Fall Risk Score >5  Goal: Patient will remain free of falls  Outcome: Progressing  Flowsheets (Taken 02/24/2024 2000)  High (Greater than 13):   HIGH-Bed alarm on at all times while patient in bed   HIGH-Visual cue at entrance to patient's room   HIGH-Utilize chair pad alarm for patient while in the chair   HIGH-Apply yellow Fall Risk arm band   HIGH-Pharmacy to initiate evaluation and intervention per protocol   HIGH-Initiate use of floor mats as appropriate   HIGH-Consider use of low bed     Problem: Safety  Goal: Patient will be free from injury during hospitalization  Outcome: Progressing  Flowsheets (Taken 02/25/2024 0301)  Patient will be free from injury during hospitalization:   Use appropriate transfer methods   Assess for patients risk for elopement and implement Elopement Risk Plan per policy   Include patient/ family/ care giver in decisions related to safety   Provide alternative method of communication if needed (communication boards, writing)   Ensure appropriate safety devices are available at the bedside   Hourly rounding   Assess patient's risk  for falls and implement fall prevention plan of care per policy   Provide and maintain safe environment     Problem: Altered GI Function  Goal: Fluid and electrolyte balance are achieved/maintained  Outcome: Progressing  Flowsheets (Taken 02/25/2024 0301)  Fluid and electrolyte balance are achieved/maintained:   Observe for cardiac arrhythmias   Monitor for muscle weakness   Monitor/assess lab values and report abnormal values   Assess and reassess fluid and electrolyte status

## 2024-02-25 NOTE — Progress Notes (Signed)
 POST OPERATIVE PROGRESS NOTE  [facility]    Date Time: 02/25/24 11:37 AM  Patient Name: Roy Delgado, Roy Delgado.    ASSESSMENT:   2 Days Post-Op S/P Procedure(s):  DIAGNOSTIC LAPAROSCOPY, EXPLORATORY LAPAROTOMY, LYSIS OF ADHESIONS, REDUCTION OF INTERNAL HERNIA (50679, 49400, 44050, 44005)  - influenza A  - urinary retention    42M admitted for abdominal pain and n/v comparable to episode in January when he was found to have SBO with SB volvulus s/p dx lap and adhesiolysis. Imaging on admission concerning for persistent volvulus, now s/p diagnostic converted to exploratory laparotomy with reduction of internal hernia and adhesiolysis. Doing well today, now passing flatus    PLAN:     - NGT out  - advance diet to clears   - no abx indicated  - ok for DVT ppx  - recommend IV medication alternatives when possible    Attending Note:    Passed clamp trial, passing some flatus  Denies much pain  Abd soft, ND, appropriately tender  Start clears today    I, Emery Bonnet , MD, have personally seen and examined this patient and have participated in their care. I agree with all clinical information, including the physical exam, patient history, and planning as documented by the resident or edited the note as needed. All notes, labs, and imaging were reviewed. The patient was seen on the date the note was written.       Emery Bonnet, MD  [facility]  [phone]      SUBJECTIVE:   The patient is doing well overall. Began to pass gas this morning. Continues to have periumbilcal abdominal pain. No nausea, no vomiting. Complains of sore throat.      OBJECTIVE:   Current Vitals:   Vitals:    02/25/24 1102   BP: 162/77   Pulse: 68   Resp: 17   Temp: 98.6 F (37 C)   SpO2: 96%       Intake and Output Summary (Last 24 hours):  I/O last 3 completed shifts:  In: 260 [NG/GT:260]  Out: 2300 [Urine:1350; Emesis/NG output:950]    Labs:     Results       Procedure Component Value Units Date/Time    Whole  Blood Glucose POCT [[id number]]  (Abnormal) Collected: 02/25/24 1100    Specimen: Blood, Capillary Updated: 02/25/24 1104     Whole Blood Glucose POCT 116 mg/dL     Culture, Blood, Aerobic And Anaerobic [[id number]] Collected: 02/23/24 0246    Specimen: Blood, Venous Updated: 02/25/24 1000     Culture Blood No growth at 2 days    Whole Blood Glucose POCT [[id number]]  (Abnormal) Collected: 02/25/24 0651    Specimen: Blood, Capillary Updated: 02/25/24 0656     Whole Blood Glucose POCT 121 mg/dL     Basic Metabolic Panel [[id number]]  (Abnormal) Collected: 02/25/24 0451    Specimen: Blood, Venous Updated: 02/25/24 0531     Glucose 135 mg/dL      BUN 29 mg/dL      Creatinine 1.3 mg/dL      Calcium 8.3 mg/dL      Sodium 860 mEq/L      Potassium 3.9 mEq/L      Chloride 105 mEq/L      CO2 26 mEq/L      Anion Gap 8.0     GFR 53.8 mL/min/1.73 m2     Magnesium  [[id number]]  (Normal) Collected: 02/25/24 0451    Specimen: Blood, Venous Updated: 02/25/24  0531     Magnesium  2.2 mg/dL     CBC without Differential [[id number]]  (Abnormal) Collected: 02/25/24 0451    Specimen: Blood, Venous Updated: 02/25/24 0516     WBC 7.03 x10 3/uL      Hemoglobin 9.7 g/dL      Hematocrit 68.9 %      Platelet Count 255 x10 3/uL      MPV 10.8 fL      RBC 3.41 x10 6/uL      MCV 90.9 fL      MCH 28.4 pg      MCHC 31.3 g/dL      RDW 15 %      nRBC % 0.0 /100 WBC      Absolute nRBC 0.00 x10 3/uL     Whole Blood Glucose POCT [[id number]]  (Abnormal) Collected: 02/24/24 2103    Specimen: Blood, Capillary Updated: 02/24/24 2110     Whole Blood Glucose POCT 164 mg/dL     Whole Blood Glucose POCT [[id number]]  (Abnormal) Collected: 02/24/24 1529    Specimen: Blood, Capillary Updated: 02/24/24 1634     Whole Blood Glucose POCT 164 mg/dL     Whole Blood Glucose POCT [[id number]]  (Abnormal) Collected: 02/24/24 1602    Specimen: Blood, Capillary Updated: 02/24/24 1603     Whole Blood Glucose POCT 161 mg/dL             Rads:     Radiology Results (24 Hour)        ** No results found for the last 24 hours. **            Physical Exam:     Physical Exam  Vitals and nursing note reviewed.   Constitutional:       General: He is not in acute distress.     Appearance: Normal appearance. He is not ill-appearing.   HENT:      Head: Normocephalic and atraumatic.   Pulmonary:      Effort: Pulmonary effort is normal.   Abdominal:      General: There is no distension.      Palpations: Abdomen is soft.      Tenderness: There is abdominal tenderness (periumbilical). There is no guarding or rebound.   Skin:     General: Skin is warm and dry.   Neurological:      Mental Status: He is alert.            Signed by: Ortencia JINNY Portela, PA

## 2024-02-26 LAB — CBC
Absolute nRBC: 0 10*3/uL (ref <=0.00)
Hematocrit: 29.9 % — ABNORMAL LOW (ref 37.6–49.6)
Hemoglobin: 9.4 g/dL — ABNORMAL LOW (ref 12.5–17.1)
MCH: 28.5 pg (ref 25.1–33.5)
MCHC: 31.4 g/dL — ABNORMAL LOW (ref 31.5–35.8)
MCV: 90.6 fL (ref 78.0–96.0)
MPV: 11 fL (ref 8.9–12.5)
Platelet Count: 251 10*3/uL (ref 142–346)
RBC: 3.3 10*6/uL — ABNORMAL LOW (ref 4.20–5.90)
RDW: 15 % (ref 11–15)
WBC: 6.09 10*3/uL (ref 3.10–9.50)
nRBC %: 0 /100{WBCs} (ref <=0.0)

## 2024-02-26 LAB — WHOLE BLOOD GLUCOSE POCT
Whole Blood Glucose POCT: 239 mg/dL — ABNORMAL HIGH (ref 70–100)
Whole Blood Glucose POCT: 226 mg/dL — ABNORMAL HIGH (ref 70–100)
Whole Blood Glucose POCT: 261 mg/dL — ABNORMAL HIGH (ref 70–100)
Whole Blood Glucose POCT: 297 mg/dL — ABNORMAL HIGH (ref 70–100)

## 2024-02-26 LAB — BASIC METABOLIC PANEL
Anion Gap: 7 (ref 5.0–15.0)
BUN: 14 mg/dL (ref 9–28)
CO2: 26 meq/L (ref 17–29)
Calcium: 8.2 mg/dL (ref 7.9–10.2)
Chloride: 104 meq/L (ref 99–111)
Creatinine: 0.9 mg/dL (ref 0.5–1.5)
GFR: >60 mL/min/{1.73_m2} (ref >=60.0)
Glucose: 185 mg/dL — ABNORMAL HIGH (ref 70–100)
Potassium: 4 meq/L (ref 3.5–5.3)
Sodium: 137 meq/L (ref 135–145)

## 2024-02-26 LAB — MAGNESIUM: Magnesium: 1.8 mg/dL (ref 1.6–2.6)

## 2024-02-26 MED ORDER — BISACODYL 10 MG RE SUPP
10.0000 mg | Freq: Once | RECTAL | Status: AC
Start: 2024-02-26 — End: 2024-02-26
  Administered 2024-02-26: 10 mg via RECTAL
  Filled 2024-02-26: qty 1

## 2024-02-26 MED ORDER — INSULIN GLARGINE 100 UNIT/ML SC SOLN
10.0000 [IU] | Freq: Every morning | SUBCUTANEOUS | Status: DC
Start: 2024-02-26 — End: 2024-02-27
  Administered 2024-02-27: 10 [IU] via SUBCUTANEOUS
  Filled 2024-02-26: qty 10

## 2024-02-26 NOTE — Discharge Instructions (Signed)
 Low-Fiber Diet     Eggs are high in protein and easy to digest.     Eating a low-fiber diet means eating foods that don?t have much fiber. These foods are easy to digest.   Most of the fiber that you eat passes undigested through your bowel. This is what forms stool. Low-fiber foods can help to slow down your bowel movements. When you eat a low-fiber diet, you have fewer stools. This lets your intestine rest.   Your healthcare provider will tell you how long you need to be on this diet. It may only be for a short time. Low-fiber foods often don?t give you all the nutrients you need to stay healthy. Your healthcare provider may have you take certain vitamins while you are on this diet.   Reasons to eat a low-fiber diet  The goal of a low-fiber diet is to limit the size and number of your stools. It may be prescribed if you:   Are going through chemotherapy or radiation treatments  Have had intestinal surgery  Have trouble digesting food  Have a condition that affects your intestine, such as irritable bowel syndrome, Crohn?s disease, ulcerative colitis, or diverticulitis  General guidelines for a low-fiber diet  In general, a low-fiber diet means having fewer than 13 grams of fiber a day. Your healthcare provider may give you a list of things you can and can?t eat or drink. Read food labels. Choose foods and drinks that have as close to zero grams of fiber as possible. Here are general guidelines to follow:   Breads, pasta, cereal, rice, and other starches (6 to 11 servings daily)  What to choose: white bread, biscuits, muffins, and white rolls; plain crackers; waffles; white pasta; white rice; cream of wheat; grits; white pancakes; corn flakes; cooked potatoes without skin; pretzels. Fiber content of these foods should be less than 0.5 (?) gram per serving.  What to pass up: whole-wheat or whole-grain breads, crackers, and pasta; breads with seeds or nuts; wheat germ; graham crackers; cornbread; wild or brown rice; cereals with whole-grain, bran, and granola; cereals with seeds, nuts, coconut, or dried fruit; potatoes with skin  Milk and dairy (2 servings daily)  What to choose: milk and buttermilk; yogurt or ice cream without seeds or nuts; custard or pudding; sour cream; cheese and cottage cheese; cream sauces, soups, and casseroles  What to pass up: ice cream and yogurt with seeds, nuts, or fruit chunks  Fruit (2 to 4 servings daily)  What to choose: ripe banana; ripe nectarine, peach, apricot, papaya, and plum; soft honeydew melon and cantaloupe; cooked or canned fruit without skin or seeds (not sweetened with sorbitol); applesauce; strained fruit juice (without pulp)  What to pass up: raw or dried fruit; all berries; raisins; canned and raw pineapple; prunes and prune juice; fruit juice with pulp  Vegetables (3 to 5 servings daily)  What to choose: well-cooked or canned vegetables without seeds, such as spinach, eggplant, green and wax beans, carrots, yellow squash, and pumpkin; lettuce on a sandwich  What to pass up: all raw or steamed vegetables; vegetables with seeds, such as unstrained tomato sauce; green peas; lima beans; broccoli; corn; parsnips  Meats and protein (4 to 6 ounces daily)  What to choose: tender, well-cooked meat, including ground meat, poultry, and fish; eggs; tofu; creamy peanut butter  What to pass up: processed meats such as hot dogs and sausages, tough, chewy meat with gristle; peas, including split, yellow, and black-eyed; beans, including  navy, lima, black, garbanzo, soy, pinto, and lentil; peanuts and crunchy peanut butter   Fats, oils, sauces, and condiments (fewer than 8 teaspoons daily)   What to choose: butter, margarine, oils, whipped cream, sour cream, mayonnaise, smooth dressings and sauces; plain gravy; smooth condiments  What to pass up: dressing with seeds or fruit chunks; pickles and relishes  Other foods and drinks  What to choose: water; plain gelatin; plain puddings; pretzels; plain cookies and cakes; honey, syrup; decaffeinated drinks, including tea and coffee    What to pass up: popcorn; potato chips; spicy foods; fried, greasy foods; alcohol (ask your healthcare provider); marmalade, jam, and preserves; desserts that have seeds, nuts, coconut, dried fruit, whole grains, or bran; candy that has seeds or nuts; drinks sweetened with sorbitol or other sugar substitutes; caffeinated drinks, including tea, coffee, soda, and energy drinks    [employer] last reviewed this educational content on 11/23/2021  ? 2000-2023 [employer], [place]. All rights reserved. This information is not intended as a substitute for professional medical care. Always follow your healthcare professional's instructions.

## 2024-02-26 NOTE — Progress Notes (Signed)
 POST OPERATIVE PROGRESS NOTE  [facility]    Date Time: 02/26/24 8:25 AM  Patient Name: Roy Delgado, Roy Delgado.    ASSESSMENT:   3 Days Post-Op S/P Procedure(s):  DIAGNOSTIC LAPAROSCOPY, EXPLORATORY LAPAROTOMY, LYSIS OF ADHESIONS, REDUCTION OF INTERNAL HERNIA (50679, 49400, 44050, 44005)  - influenza A  - urinary retention    59M admitted for abdominal pain and n/v comparable to episode in January when he was found to have SBO with SB volvulus s/p dx lap and adhesiolysis. Imaging on admission concerning for persistent volvulus, now s/p diagnostic converted to exploratory laparotomy with reduction of internal hernia and adhesiolysis. Doing well today, passing flatus.    PLAN:     - advance diet to regular diet  - no abx indicated  - on Lovenox  for DVT ppx  - encourage ambulation  - use IS  - multimodal pain control    Attending Note:    I, Emery Bonnet, MD, have personally seen and examined this patient and have participated in their care. I agree with all clinical information, including the physical exam, patient history, and planning as documented by the APP or edited the note as needed. All notes, labs, and imaging were reviewed. The patient was seen on the date the note was written.    Emery Bonnet, MD  [facility]  [phone]    SUBJECTIVE:     The patient is doing well overall. Passing flatus. No bowel movement yet. Denies pain. Ambulating. Tolerated clears. No nausea, no vomiting. Patient stated that he is hungry.     OBJECTIVE:   Current Vitals:   Vitals:    02/26/24 0801   BP: 149/85   Pulse: 99   Resp: 16   Temp: 97.7 F (36.5 C)   SpO2: 98%       Intake and Output Summary (Last 24 hours):  I/O last 3 completed shifts:  In: 540 [P.O.:400; NG/GT:140]  Out: 2735 [Urine:2235; Emesis/NG output:500]    Labs:     Results       Procedure Component Value Units Date/Time    Whole Blood Glucose POCT [[id number]]  (Abnormal) Collected: 02/26/24 0757    Specimen: Blood, Capillary  Updated: 02/26/24 0804     Whole Blood Glucose POCT 239 mg/dL     Basic Metabolic Panel [[id number]]  (Abnormal) Collected: 02/26/24 0436    Specimen: Blood, Venous Updated: 02/26/24 0527     Glucose 185 mg/dL      BUN 14 mg/dL      Creatinine 0.9 mg/dL      Calcium 8.2 mg/dL      Sodium 862 mEq/L      Potassium 4.0 mEq/L      Chloride 104 mEq/L      CO2 26 mEq/L      Anion Gap 7.0     GFR >60.0 mL/min/1.73 m2     Magnesium  [[id number]]  (Normal) Collected: 02/26/24 0436    Specimen: Blood, Venous Updated: 02/26/24 0527     Magnesium  1.8 mg/dL     CBC without Differential [[id number]]  (Abnormal) Collected: 02/26/24 0436    Specimen: Blood, Venous Updated: 02/26/24 0507     WBC 6.09 x10 3/uL      Hemoglobin 9.4 g/dL      Hematocrit 70.0 %      Platelet Count 251 x10 3/uL      MPV 11.0 fL      RBC 3.30 x10 6/uL      MCV 90.6 fL  MCH 28.5 pg      MCHC 31.4 g/dL      RDW 15 %      nRBC % 0.0 /100 WBC      Absolute nRBC 0.00 x10 3/uL     Whole Blood Glucose POCT [[id number]]  (Abnormal) Collected: 02/25/24 2108    Specimen: Blood, Capillary Updated: 02/25/24 2109     Whole Blood Glucose POCT 152 mg/dL     Whole Blood Glucose POCT [[id number]]  (Abnormal) Collected: 02/25/24 1633    Specimen: Blood, Capillary Updated: 02/25/24 1638     Whole Blood Glucose POCT 110 mg/dL     Whole Blood Glucose POCT [[id number]]  (Normal) Collected: 02/25/24 1517    Specimen: Blood, Capillary Updated: 02/25/24 1519     Whole Blood Glucose POCT 79 mg/dL     Whole Blood Glucose POCT [[id number]]  (Abnormal) Collected: 02/25/24 1459    Specimen: Blood, Capillary Updated: 02/25/24 1509     Whole Blood Glucose POCT 35 mg/dL     Whole Blood Glucose POCT [[id number]]  (Abnormal) Collected: 02/25/24 1428    Specimen: Blood, Capillary Updated: 02/25/24 1434     Whole Blood Glucose POCT 32 mg/dL     Whole Blood Glucose POCT [[id number]]  (Abnormal) Collected: 02/25/24 1100    Specimen: Blood, Capillary Updated: 02/25/24 1104     Whole Blood  Glucose POCT 116 mg/dL     Culture, Blood, Aerobic And Anaerobic [[id number]] Collected: 02/23/24 0246    Specimen: Blood, Venous Updated: 02/25/24 1000     Culture Blood No growth at 2 days            Rads:     Radiology Results (24 Hour)       ** No results found for the last 24 hours. **            Physical Exam:     Physical Exam  Vitals and nursing note reviewed.   Constitutional:       General: He is not in acute distress.     Appearance: Normal appearance. He is not ill-appearing.   HENT:      Head: Normocephalic and atraumatic.   Pulmonary:      Effort: Pulmonary effort is normal.   Abdominal:      General: There is no distension.      Palpations: Abdomen is soft.      Tenderness: There is no abdominal tenderness. There is no guarding or rebound.      Comments: Incisions clean, dry and intact.   Skin:     General: Skin is warm and dry.   Neurological:      Mental Status: He is alert.              Signed by: Concetta CHRISTELLA Plume, PA

## 2024-02-26 NOTE — Progress Notes (Signed)
 [facility]PITAL[facility]  PROGRESS NOTE      Patient: Roy Delgado.  Date: 02/26/2024   LOS: 3 Days  Admission Date: 02/23/2024   MRN: [id number]  Attending: Wyatt FORBES Snow, MD  Please contact me on Epic secure chat        ASSESSMENT/PLAN     Geovonni Meyerhoff Maksim Peregoy. is a 88 y.o. male w/ pmhx of DMT2, HTN, internal hernia, recent admission for internal hernia causing bowel obstruction (2/2 adhesive tissue), now s/p diagnostic laparoscopy with LOA and reduction of volvulus on 01/13/2024 admitted for recurrent midgut volvulus s/p diagnostic laparoscopy with LOA and reduction of internal hernia on 3/2.     # Recurrent midgut volvulus  # Nausea, vomiting, abdominal pain  -S/p diagnostic laparoscopy with LOA and reduction of internal hernia on 3/2  -Discussed with surgery,NGT removed 3/4, advance to regular diet  -Pain control with IV Tylenol , as needed oxycodone  and IV Dilaudid   -Bisacodyl  suppository ordered  -Has not had a bowel movement, discharge pending return of bowel function     # Flu a positive  # Acute hypoxemic respiratory failure  -Per daughter patient took 4 doses of Tamiflu  at home  -Continue tamiflu  to complete 5 day course  -CXR with interstitial lung markings concerning for pulmonary congestion  -NT-proBNP 703  -Echo 12/19/2016  notable for LVEF 55 to 60%,     #AKI on CKD  S/p IV fluids, creatinine now down to 0.9  - Daily BMP     # Hypertension  -Family patient is taking olmesartan and losartan  at home.  Will stop olmesartan and only continue losartan .  Patient is currently taking 25 mg twice daily, will consolidate dose to 50 mg once daily  -Continue losartan  50 mg once daily  -Continue metoprolol   -start amlodipine  5mg  daily given persistent htn     # Type 2 diabetes  # Hyperglycemia  # Hypoglycemia  Takes insulin  glargine 27 units daily, lispro 15 units 3 times daily  -Patient hypoglycemic to 32 on 3/5, standing long-acting insulin  discontinued, now hyperglycemic in the 200s, will resume low-dose glargine  at 10 units once daily  - continue ldssi and blood glucose checks with meals and at night time      #Allergies  -continue montelukast  and formulary antihistamine    #Mood disorder  -continue sertraline     #Neuropathy  -continue lyrica       Nutrition: Regular diet  Code: Full  DVT prophylaxis:   Current Facility-Administered Medications (Includes Only Anticoagulants, Misc. Hematological)   Medication Dose Route Last Admin    enoxaparin  (LOVENOX ) syringe 40 mg  40 mg Subcutaneous 40 mg at 02/26/24 9149     Disposition:   50 minutes spent in care and evaluation of patient.              Signed,  Wyatt FORBES Snow, MD  6:04 PM 02/26/2024   Time signed does not reflect time patient seen.     SUBJECTIVE   Patient reports he feels okay.  Denies nausea or vomiting.  Has some mild abdominal pain    OBJECTIVE     Vitals:    02/26/24 1612   BP: 159/88   Pulse: 80   Resp: 19   Temp: 97.9 F (36.6 C)   SpO2: 94%       Temperature: Temp  Min: 97.7 F (36.5 C)  Max: 98.6 F (37 C)  Pulse: Pulse  Min: 68  Max: 99  Respiratory: Resp  Min: 16  Max: 19  Non-Invasive BP: BP  Min: 107/73  Max: 159/88  Pulse Oximetry SpO2  Min: 90 %  Max: 98 %    Intake and Output Summary (Last 24 hours) at Date Time    Intake/Output Summary (Last 24 hours) at 02/26/2024 1804  Last data filed at 02/26/2024 1228  Gross per 24 hour   Intake 350 ml   Output 1350 ml   Net -1000 ml          Physical Examination:    General:  No acute distress   HEENT: Normocephalic, atraumatic, extraocular movements intact, normal conjunctivae. No scleral icterus.    cardiovascular: Normal rate  Respiratory: Normal respiratory rate.   Gastrointestinal: soft, non-tender, diffusely tender to palpation, surgical incisions healing well  Skin: Dry. No rashes, jaundice or other lesions.   Neurologic: AAOx3.  Face symmetric.  CN 2-12 grossly intact.     MEDICATIONS     Current Facility-Administered Medications   Medication Dose Route Frequency    amLODIPine   5 mg Oral Daily     cetirizine   5 mg Oral Daily    dorzolamide -timolol   1 drop Both Eyes Q12H [facility]    enoxaparin   40 mg Subcutaneous Daily    insulin  glargine  10 Units Subcutaneous QAM    insulin  lispro  1-3 Units Subcutaneous QHS    insulin  lispro  1-5 Units Subcutaneous TID AC    lidocaine   1 patch Transdermal Q24H    losartan   50 mg Oral Daily    metoprolol  succinate XL  25 mg Oral Daily    montelukast   10 mg Oral QHS    oseltamivir   30 mg Oral Q12H [facility]    pantoprazole   40 mg Intravenous Daily    pregabalin   100 mg Oral QHS    sertraline   25 mg Oral Daily    sodium chloride   500 mL Intravenous Once    tamsulosin   0.4 mg Oral Daily after dinner            LABS/IMAGING     Recent Labs   Lab 02/26/24  0436 02/25/24  0451 02/24/24  0316   WBC 6.09 7.03 12.58*   RBC 3.30* 3.41* 3.68*   Hemoglobin 9.4* 9.7* 10.9*   Hematocrit 29.9* 31.0* 33.6*   MCV 90.6 90.9 91.3   Platelet Count 251 255 268       Recent Labs   Lab 02/26/24  0436 02/25/24  0451 02/24/24  0316 02/23/24  0246   Sodium 137 139 138 139   Potassium 4.0 3.9 4.8 5.3   Chloride 104 105 107 103   CO2 26 26 21 25    BUN 14 29* 30* 27   Creatinine 0.9 1.3 1.3 1.3   Glucose 185* 135* 277* 157*   Calcium 8.2 8.3 8.1 9.4   Magnesium  1.8 2.2 1.7  --        Recent Labs   Lab 02/24/24  0316 02/23/24  0246   ALT <6 12   AST (SGOT) 13 25   Bilirubin, Total 0.7 0.5   Bilirubin Direct 0.3  --    Albumin  3.0* 4.0   Alkaline Phosphatase 125* 162*       Recent Labs   Lab 02/23/24  0246   hs Troponin 10.4             Microbiology Results (last 15 days)       Procedure Component Value Units Date/Time    COVID-19 and Influenza ([place]) (symptomatic) [[id number]]  (  Abnormal) Collected: 02/23/24 0325    Order Status: Completed Specimen: Swab from Anterior Nares Updated: 02/23/24 0401     SARS-CoV-2 (COVID-19) RNA Not Detected     Influenza A RNA Detected     Influenza B RNA Not Detected    Narrative:      A result of Detected indicates POSITIVE for the presence of viral RNA  A result of Not  Detected indicates NEGATIVE for the presence of viral RNA    Test performed using the Roche cobas [place] SARS-CoV-2 & Influenza A/B assay. This is a multiplex real-time RT-PCR assay for the detection of SARS-CoV-2, influenza A, and influenza B virus RNA. Viral nucleic acids may persist in vivo, independent of viability. Detection of viral nucleic acid does not imply the presence of infectious virus, or that virus nucleic acid is the cause of clinical symptoms. Negative results do not preclude SARS-CoV-2, influenza A, and/or influenza B infection and should not be used as the sole basis for diagnosis, treatment or other patient management decisions. Invalid results may be due to inhibiting substances in the specimen and recollection should occur.     Culture, Blood, Aerobic And Anaerobic [[id number]] Collected: 02/23/24 0246    Order Status: Completed Specimen: Blood, Venous Updated: 02/26/24 1000     Culture Blood No growth at 3 days             Chest AP Portable    Result Date: 02/23/2024  1.NG tube in the stomach. 2.Bibasilar atelectasis. Reena D'Heureux, MD 02/23/2024 9:45 AM    CT Abd/Pelvis with IV Contrast    Result Date: 02/23/2024  1.Persistent dilation of the stomach, duodenum, and proximal jejunum with findings suggestive of midgut volvulus. 2.Chronic renal atrophy and scarring. 3.Nonobstructing left renal stones. Justus Radford, MD 02/23/2024 4:43 AM    XR Chest  AP Portable    Result Date: 02/23/2024  1.Mild cardiomegaly with diffuse prominence of the central vascular and interstitial lung markings, suggestive of pulmonary congestion. 2.Elevation of the left hemidiaphragm with left basilar atelectasis. 3.Stomach remains enlarged and opacified with gas. Justus Radford, MD 02/23/2024 3:31 AM     Echo Results       None            No results found for this or any previous visit.

## 2024-02-26 NOTE — Progress Notes (Signed)
 02/26/24 1600   Volunteer Chaplain   Visit Type Initial   Source Chaplain Initiated   Present at Visit Patient;Caregiver;Patient declined visit   Spiritual Care Interventions Provided Chaplaincy Education   Spiritual Care Outcomes Declined visit   Length of Visit 0-15 minutes   Follow-up No follow-up at this time

## 2024-02-26 NOTE — Plan of Care (Addendum)
 Pt Roy Delgado, VSS, RA. IS use reinforced. Pain controled with scheduled and PRN pain meds. Dressing to the abdomen CDI. Pt voiding ,+gas, -BM yet.  Pt is tolerating clear liquids , no reports of nausea/vomiting. Pt OOB w/ walker x2 assist, ambulated in the hallway during this shift.  Safety and fall precautions remain in place.     Problem: Moderate/High Fall Risk Score >5  Goal: Patient will remain free of falls  Outcome: Progressing  Flowsheets (Taken 02/25/2024 2300)  High (Greater than 13):   HIGH-Bed alarm on at all times while patient in bed   HIGH-Visual cue at entrance to patient's room   HIGH-Utilize chair pad alarm for patient while in the chair   HIGH-Apply yellow Fall Risk arm band   HIGH-Pharmacy to initiate evaluation and intervention per protocol   HIGH-Initiate use of floor mats as appropriate   HIGH-Consider use of low bed     Problem: Safety  Goal: Patient will be free from injury during hospitalization  Outcome: Progressing  Flowsheets (Taken 02/25/2024 0301)  Patient will be free from injury during hospitalization:   Use appropriate transfer methods   Assess for patients risk for elopement and implement Elopement Risk Plan per policy   Include patient/ family/ care giver in decisions related to safety   Provide alternative method of communication if needed (communication boards, writing)   Ensure appropriate safety devices are available at the bedside   Hourly rounding   Assess patient's risk for falls and implement fall prevention plan of care per policy   Provide and maintain safe environment     Problem: Altered GI Function  Goal: Fluid and electrolyte balance are achieved/maintained  Outcome: Progressing  Flowsheets (Taken 02/25/2024 0301)  Fluid and electrolyte balance are achieved/maintained:   Observe for cardiac arrhythmias   Monitor for muscle weakness   Monitor/assess lab values and report abnormal values   Assess and reassess fluid and electrolyte status

## 2024-02-26 NOTE — Plan of Care (Signed)
 NURSING SHIFT NOTE     Patient: Roy Delgado.  Day: 3      SHIFT EVENTS     Shift Narrative/Significant Events (PRN med administration, fall, RRT, etc.):     Patient A&O x 4. VSS on room air. Scheduled meds given, tolerated well. PRN Miralax  given.     Bed locked in lowest position. Call light within reach. Safety measures in place. Purposeful rounding completed.    Continue plan of care for pain management, ambulation, IS, advance diet, bowel regimen.    Discharge TBD.       CARE PLAN        Problem: Pain interferes with ability to perform ADL  Goal: Pain at adequate level as identified by patient  Outcome: Progressing  Flowsheets (Taken 02/26/2024 0951 by Neysa Raisin, RN)  Pain at adequate level as identified by patient:   Identify patient comfort function goal   Assess for risk of opioid induced respiratory depression, including snoring/sleep apnea. Alert healthcare team of risk factors identified.   Reassess pain within 30-60 minutes of any procedure/intervention, per Pain Assessment, Intervention, Reassessment (AIR) Cycle   Assess pain on admission, during daily assessment and/or before any as needed intervention(s)   Evaluate patient's satisfaction with pain management progress   Offer non-pharmacological pain management interventions   Evaluate if patient comfort function goal is met   Consult/collaborate with Physical Therapy, Occupational Therapy, and/or Speech Therapy   Include patient/patient care companion in decisions related to pain management as needed     Problem: Side Effects from Pain Analgesia  Goal: Patient will experience minimal side effects of analgesic therapy  Outcome: Progressing  Flowsheets (Taken 02/26/2024 2032)  Patient will experience minimal side effects of analgesic therapy:   Monitor/assess patient's respiratory status (RR depth, effort, breath sounds)   Assess for changes in cognitive function   Prevent/manage side effects per LIP orders (i.e. nausea, vomiting, pruritus,  constipation, urinary retention, etc.)   Evaluate for opioid-induced sedation with appropriate assessment tool (i.e. POSS)     Problem: Moderate/High Fall Risk Score >5  Goal: Patient will remain free of falls  Outcome: Progressing  Flowsheets (Taken 02/26/2024 2020)  High (Greater than 13):   HIGH-Visual cue at entrance to patient's room   HIGH-Bed alarm on at all times while patient in bed   HIGH-Utilize chair pad alarm for patient while in the chair   HIGH-Apply yellow Fall Risk arm band   HIGH-Pharmacy to initiate evaluation and intervention per protocol   HIGH-Initiate use of floor mats as appropriate   HIGH-Consider use of low bed     Problem: Compromised Sensory Perception  Goal: Sensory Perception Interventions  Outcome: Progressing  Flowsheets (Taken 02/26/2024 2020)  Sensory Perception Interventions: Offload heels, Pad bony prominences, Reposition q 2hrs/turn Clock, Q2 hour skin assessment under devices if present     Problem: Compromised Moisture  Goal: Moisture level Interventions  Outcome: Progressing  Flowsheets (Taken 02/26/2024 2020)  Moisture level Interventions: Moisture wicking products, Moisture barrier cream     Problem: Compromised Activity/Mobility  Goal: Activity/Mobility Interventions  Outcome: Progressing  Flowsheets (Taken 02/26/2024 2020)  Activity/Mobility Interventions: Pad bony prominences, TAP Seated positioning system when OOB, Promote PMP, Reposition q 2 hrs / turn clock, Offload heels     Problem: Compromised Nutrition  Goal: Nutrition Interventions  Outcome: Progressing  Flowsheets (Taken 02/26/2024 2020)  Nutrition Interventions: Discuss nutrition at Rounds, I&Os, Document % meal eaten, Daily weights     Problem: Compromised Friction/Shear  Goal: Friction and Shear Interventions  Outcome: Progressing  Flowsheets (Taken 02/26/2024 2020)  Friction and Shear Interventions: Pad bony prominences, Off load heels, HOB 30 degrees or less unless contraindicated, Consider: TAP seated positioning,  Heel foams     Problem: Safety  Goal: Patient will be free from injury during hospitalization  Outcome: Progressing  Flowsheets (Taken 02/26/2024 0951 by Neysa Raisin, RN)  Patient will be free from injury during hospitalization:   Assess patient's risk for falls and implement fall prevention plan of care per policy   Provide and maintain safe environment   Use appropriate transfer methods   Hourly rounding     Problem: Altered GI Function  Goal: Fluid and electrolyte balance are achieved/maintained  Outcome: Progressing  Flowsheets (Taken 02/26/2024 0951 by Neysa Raisin, RN)  Fluid and electrolyte balance are achieved/maintained:   Monitor/assess lab values and report abnormal values   Assess and reassess fluid and electrolyte status   Observe for cardiac arrhythmias   Monitor for muscle weakness

## 2024-02-26 NOTE — Plan of Care (Signed)
 Problem: Pain interferes with ability to perform ADL  Goal: Pain at adequate level as identified by patient  Outcome: Progressing  Flowsheets (Taken 02/26/2024 0951)  Pain at adequate level as identified by patient:   Identify patient comfort function goal   Assess for risk of opioid induced respiratory depression, including snoring/sleep apnea. Alert healthcare team of risk factors identified.   Reassess pain within 30-60 minutes of any procedure/intervention, per Pain Assessment, Intervention, Reassessment (AIR) Cycle   Assess pain on admission, during daily assessment and/or before any as needed intervention(s)   Evaluate patient's satisfaction with pain management progress   Offer non-pharmacological pain management interventions   Evaluate if patient comfort function goal is met   Consult/collaborate with Physical Therapy, Occupational Therapy, and/or Speech Therapy   Include patient/patient care companion in decisions related to pain management as needed     Problem: Safety  Goal: Patient will be free from injury during hospitalization  Outcome: Progressing  Flowsheets (Taken 02/26/2024 0951)  Patient will be free from injury during hospitalization:   Assess patient's risk for falls and implement fall prevention plan of care per policy   Provide and maintain safe environment   Use appropriate transfer methods   Hourly rounding     Problem: Altered GI Function  Goal: Fluid and electrolyte balance are achieved/maintained  Outcome: Progressing  Flowsheets (Taken 02/26/2024 0951)  Fluid and electrolyte balance are achieved/maintained:   Monitor/assess lab values and report abnormal values   Assess and reassess fluid and electrolyte status   Observe for cardiac arrhythmias   Monitor for muscle weakness

## 2024-02-27 LAB — CBC
Absolute nRBC: 0 10*3/uL (ref <=0.00)
Hematocrit: 30.8 % — ABNORMAL LOW (ref 37.6–49.6)
Hemoglobin: 9.8 g/dL — ABNORMAL LOW (ref 12.5–17.1)
MCH: 28.8 pg (ref 25.1–33.5)
MCHC: 31.8 g/dL (ref 31.5–35.8)
MCV: 90.6 fL (ref 78.0–96.0)
MPV: 11 fL (ref 8.9–12.5)
Platelet Count: 268 10*3/uL (ref 142–346)
RBC: 3.4 10*6/uL — ABNORMAL LOW (ref 4.20–5.90)
RDW: 15 % (ref 11–15)
WBC: 4.43 10*3/uL (ref 3.10–9.50)
nRBC %: 0 /100{WBCs} (ref <=0.0)

## 2024-02-27 LAB — BASIC METABOLIC PANEL
Anion Gap: 9 (ref 5.0–15.0)
BUN: 15 mg/dL (ref 9–28)
CO2: 22 meq/L (ref 17–29)
Calcium: 8.2 mg/dL (ref 7.9–10.2)
Chloride: 102 meq/L (ref 99–111)
Creatinine: 1.1 mg/dL (ref 0.5–1.5)
GFR: >60 mL/min/{1.73_m2} (ref >=60.0)
Glucose: 346 mg/dL — ABNORMAL HIGH (ref 70–100)
Potassium: 4.4 meq/L (ref 3.5–5.3)
Sodium: 133 meq/L — ABNORMAL LOW (ref 135–145)

## 2024-02-27 LAB — WHOLE BLOOD GLUCOSE POCT
Whole Blood Glucose POCT: 382 mg/dL — ABNORMAL HIGH (ref 70–100)
Whole Blood Glucose POCT: 353 mg/dL — ABNORMAL HIGH (ref 70–100)
Whole Blood Glucose POCT: 239 mg/dL — ABNORMAL HIGH (ref 70–100)
Whole Blood Glucose POCT: 194 mg/dL — ABNORMAL HIGH (ref 70–100)

## 2024-02-27 LAB — MAGNESIUM: Magnesium: 1.8 mg/dL (ref 1.6–2.6)

## 2024-02-27 MED ORDER — ENEMA 7-19 GM/118ML RE ENEM
1.0000 | ENEMA | Freq: Once | RECTAL | 0 refills | Status: AC
Start: 2024-02-27 — End: 2024-02-27

## 2024-02-27 MED ORDER — ENEMA 7-19 GM/118ML RE ENEM
1.0000 | ENEMA | Freq: Once | RECTAL | Status: AC
Start: 2024-02-27 — End: 2024-02-27
  Administered 2024-02-27: 1 via RECTAL
  Filled 2024-02-27: qty 1

## 2024-02-27 MED ORDER — TAMSULOSIN HCL 0.4 MG PO CAPS
0.4000 mg | ORAL_CAPSULE | Freq: Every day | ORAL | 3 refills | Status: AC
Start: 2024-02-27 — End: ?

## 2024-02-27 MED ORDER — POLYETHYLENE GLYCOL 3350 17 G PO PACK
17.0000 g | PACK | Freq: Every day | ORAL | Status: DC
Start: 2024-02-27 — End: 2024-02-27
  Filled 2024-02-27: qty 1

## 2024-02-27 NOTE — Progress Notes (Signed)
 [place] order written DCP home pending PT  evaluation .  CM Stat PT requested.  [place] via [facility], pick up time changed to 5 PM  per bedside nurse request.  Deliah Boston BSN RN  RN Case Manager  [facility]  [phone]

## 2024-02-27 NOTE — Discharge Summary (Signed)
 Discharge Summary    Date:02/27/2024   Patient Name: Roy Delgado, Roy Delgado.  Attending Physician: Fernand Leu, DO    Date of Admission:   02/23/2024    Date of Discharge:   02/27/24    Admitting Diagnosis:   small bowel volvulus     Discharge Dx:     Principal Diagnosis (Diagnosis after study, that is chiefly responsible for admission to inpatient status): Influenza       Additional Diagnoses:     Patient has a BMI of 26.75 kg/m2    Diagnosis: Overweight: BMI of 25 to 29.9       Recent Labs     02/27/24  0506 02/26/24  0436 02/25/24  0451   Sodium 133* 137 139     Diagnosis: Mild Hyponatremia     Recent Labs   Lab 02/27/24  0506 02/26/24  0436 02/25/24  0451   Hemoglobin 9.8* 9.4* 9.7*   Hematocrit 30.8* 29.9* 31.0*   MCV 90.6 90.6 90.9   WBC 4.43 6.09 7.03   Platelet Count 268 251 255         Anemia Diagnosis: No new diagnosis            Treatment Team:   Treatment Team:   Attending Provider: Fernand Leu, DO     Procedures performed:   Radiology: all results from this admission  Chest AP Portable    Result Date: 02/23/2024  1.NG tube in the stomach. 2.Bibasilar atelectasis. Reena D'Heureux, MD 02/23/2024 9:45 AM    CT Abd/Pelvis with IV Contrast    Result Date: 02/23/2024  1.Persistent dilation of the stomach, duodenum, and proximal jejunum with findings suggestive of midgut volvulus. 2.Chronic renal atrophy and scarring. 3.Nonobstructing left renal stones. Justus Radford, MD 02/23/2024 4:43 AM    XR Chest  AP Portable    Result Date: 02/23/2024  1.Mild cardiomegaly with diffuse prominence of the central vascular and interstitial lung markings, suggestive of pulmonary congestion. 2.Elevation of the left hemidiaphragm with left basilar atelectasis. 3.Stomach remains enlarged and opacified with gas. Justus Radford, MD 02/23/2024 3:31 AM   Radiology: all results in the last 7 days  Chest AP Portable    Result Date: 02/23/2024  1.NG tube in the stomach. 2.Bibasilar atelectasis. Reena D'Heureux, MD 02/23/2024 9:45 AM    CT Abd/Pelvis with IV  Contrast    Result Date: 02/23/2024  1.Persistent dilation of the stomach, duodenum, and proximal jejunum with findings suggestive of midgut volvulus. 2.Chronic renal atrophy and scarring. 3.Nonobstructing left renal stones. Justus Radford, MD 02/23/2024 4:43 AM    XR Chest  AP Portable    Result Date: 02/23/2024  1.Mild cardiomegaly with diffuse prominence of the central vascular and interstitial lung markings, suggestive of pulmonary congestion. 2.Elevation of the left hemidiaphragm with left basilar atelectasis. 3.Stomach remains enlarged and opacified with gas. Justus Radford, MD 02/23/2024 3:31 AM   Radiology: all results in the last 24 hours  No results found.  Surgery: all results from this admission  Procedure(s):  DIAGNOSTIC LAPAROSCOPY, EXPLORATORY LAPAROTOMY, LYSIS OF ADHESIONS, REDUCTION OF INTERNAL HERNIA (50679, 49400, 44050, 44005) (N/A)    Reason for Admission:   small bowel volvulus   Hospital Course:     Dahir Ayer. is a 88 y.o. male admitted with SBO secondary to small bowel volvulus now s/p diagnostic laparoscopy c/t laparotomy w/ LOA.   Saw the patient this morning and he is doing well and feels better. Stable to follow up with PCP in 1 week  Condition at Discharge:   stable  Today:     BP 177/80   Pulse 72   Temp 97.9 F (36.6 C) (Oral)   Resp 17   Ht 1.829 m (6' 0.01)   Wt 89.5 kg (197 lb 5 oz)   SpO2 97%   BMI 26.75 kg/m   Ranges for the last 24 hours:  Temp:  [97.7 F (36.5 C)-98.8 F (37.1 C)] 97.9 F (36.6 C)  Heart Rate:  [68-80] 72  Resp Rate:  [17-19] 17  BP: (149-177)/(64-94) 177/80    Last set of labs   Recent Labs   Lab 02/27/24  0506   WBC 4.43   Hemoglobin 9.8*   Hematocrit 30.8*   Platelet Count 268     Recent Labs   Lab 02/27/24  0506   Sodium 133*   Potassium 4.4   Chloride 102   CO2 22   BUN 15   Creatinine 1.1   GFR >60.0   Glucose 346*   Calcium 8.2     Recent Labs   Lab 02/24/24  0316   Bilirubin, Total 0.7   Bilirubin Direct 0.3   Protein, Total 5.8*   Albumin   3.0*   ALT <6   AST (SGOT) 13           Micro / Labs / Path pending:     Unresulted Labs       None               Discharge Instructions:      Follow-up Information       Herb Re, MD Follow up in 2 week(s).    Specialty: Surgery  Contact information:  [address]  [address]  [place] [address]  [phone]               Celestina Lucie PARAS, MD .    Specialty: Family Medicine  Contact information:  [address] [place] [address]  [phone]                             Discharge Diet: Cardiac Diet  Wound 01/13/24 Incision Abdomen (Active)   Number of days: 44       Wound 02/23/24 Incision Abdomen (Active)   Wound Base Description Dry 02/27/24 0800   Peri-wound Description Clean;Dry;Intact 02/27/24 0800   Drainage Amount None 02/27/24 0800   Treatments Cold Compress 02/23/24 1334   Dressing Open to air;Skin Adhesive 02/27/24 0800   Number of days: 4        Peripheral IV 02/26/24 22 G Anterior;Right Wrist (Active)   Site Assessment Clean;Dry;Intact 02/27/24 1000   Line Status Capped;Saline Locked;Flushed 02/27/24 1000   Dressing Status Intact;Dry;Clean 02/27/24 1000   Dressing Dated? Yes 02/26/24 0800   Dressing Change Due 03/04/24 02/27/24 1000   Reason Not Rotated Not due 02/27/24 1000   Number of days: 1     Disposition:  Home-Health Care Svc     Discharge Medication List        Taking      buPROPion XL 150 MG 24 hr tablet  Dose: 150 mg  Commonly known as: WELLBUTRIN XL  For: Depression  Take 1 tablet (150 mg) by mouth daily     COMBIGAN OP  Dose: 1 drop  Apply 1 drop to eye 2 (two) times daily.     HUMALOG  SC  Dose: 15 Units  Inject  15 Units into the skin 3 (three) times daily.     insulin  glargine 100 UNIT/ML injection  Dose: 27 Units  Commonly known as: LANTUS   Inject 27 Units into the skin daily     levocetirizine 2.5 MG/5ML solution  Dose: 2.5 mg  Commonly known as: XYZAL  Take 5 mLs (2.5 mg) by mouth every evening     losartan  25 MG tablet  Dose: 25 mg  Commonly known as: COZAAR   Take 1 tablet  (25 mg) by mouth 2 (two) times daily     metoprolol  succinate XL 25 MG 24 hr tablet  Dose: 25 mg  Commonly known as: TOPROL -XL  Take 1 tablet (25 mg) by mouth daily     montelukast  10 MG tablet  Dose: 10 mg  Commonly known as: SINGULAIR   Take 1 tablet (10 mg) by mouth nightly     olmesartan 40 MG tablet  Dose: 40 mg  Commonly known as: BENICAR  Take 40 mg by mouth daily.     pregabalin  100 MG capsule  Dose: 100 mg  Commonly known as: LYRICA   Take 1 capsule (100 mg) by mouth nightly     sertraline  25 MG tablet  Dose: 25 mg  Commonly known as: ZOLOFT   Take 1 tablet (25 mg) by mouth daily            Minutes spent coordinating discharge and reviewing discharge plan: 60 minutes      Signed by: Gregory Bathe, DO

## 2024-02-27 NOTE — Progress Notes (Signed)
 02/27/24 1517   Discharge Disposition   Patient preference/choice provided? Yes   Physical Discharge Disposition Home, Home Health   Mode of Transportation Ambulance   Patient/Family/POA notified of transfer plan Yes   Patient agreeable to discharge plan/expected d/c date? Yes   Family/POA agreeable to discharge plan/expected d/c date? Yes   Bedside nurse notified of transport plan? Yes   Special requirements for patient during transport: Isolation   CM Interventions   Multidisciplinary rounds/family meeting before d/c? Yes     Pt. agreeable for [facility] home with HHPT/HHOT,  HH referral sent to [facility],  MMT to transport at 4 PM.  Bedside nurse made aware.  Daughter , Nat  # [phone] updated.  Marko Canner BSN RN  RN Case Manager  [facility]  [phone]

## 2024-02-27 NOTE — Consults (Addendum)
 Start [facility] Note  Home Health Referral    Referral from [facility] (Case Manager) for home health care upon discharge.    By [employer] systems, the patient has the right to freely choose a home care provider.    A company of the patients choosing. We have supplied the patient with a listing of providers in your area who asked to be included and participate in Medicare.   [facility] a home care agency that provides adult home care services and participates in Medicare   The preferred provider of your insurance company. Choosing a home care provider other than your insurance company's preferred provider may affect your insurance coverage.      Home Health Discharge Information    Your doctor has ordered Physical Therapy and Occupational Therapy in-home service(s) for you while you recuperate at home, to assist you in the transition from hospital to home.    The agency that you or your representative chose to provide the service:  Name of Home Health Agency Placement: [facility]]  Phone: [phone]     The above services were set up by:  Murleen Curtistine Meissner, RN (Post Acute Care Coordinator)   Phone: [phone]        IF YOU HAVE NOT HEARD FROM YOUR HOME HEALTH AGENCY WITHIN 24-48 HOURS AFTER DISCHARGE PLEASE CALL YOUR AGENCY TO ARRANGE A TIME FOR YOUR FIRST VISIT. FOR ANY SCHEDULING CONCERNS OR QUESTIONS RELATED TO HOME HEALTH, SUCH AS TIME OR DATE PLEASE CONTACT YOUR HOME HEALTH AGENCY AT THE NUMBER LISTED ABOVE.    Additional comments:        START PATIENT REGISTRATION INFORMATION     Order Information  Order Signing Physician: Fernand Leu, DO    Service Ordered RN ?: No    Service Ordered PT ?: Yes  Service Ordered OT ?: Yes  Service Ordered ST ?: No    Service Ordered MSW?: No    Service Ordered HHA?: No    Following Physician: Celestina Lucie PARAS, MD   Following Physician Phone: [phone]   Overseeing Physician: N/A  (Required for Residents only)   Agreeable to Follow?:  N/A  Spoke with: N/A  Date/Time of Call: 02/27/24 3:24 PM      Care Coordination   SOC Call from [facility] Required?: no  [facility]?: no  Primary Care Physician:Samantha PARAS Celestina, MD  Primary Care Physician Phone:[phone]  Primary Care Physician Address: [address] [place] [address]  PCP NPI: [id number]  Visit Instructions: N/A  Service Discharge Location Type: Home  Service Facility Name: N/A  Service Floor Facility: N/A  Service Room No: N/A    Demographics  Patient Last Name: Signa   Patient First Name: [facility]  Language/Communication Barrier: no  Service Address: 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]  Influenza [J11.1]     Caregiver Information  Caregiver First Name: N/A  Caregiver Last Name: N/A  Caregiver Relationship to Patient: Other  Caregiver Phone Number: N/A  Caregiver Notes: N/A  Data Processing Manager Information  Primary Subscriber:   Primary Subscriber Relation To Guarantor:   Primary Payor:   Primary Plan:   Primary Group #:    Primary Subscriber ID:    Primary Subscriber DOB:   Secondary Insurance Information  Secondary Subscriber:   Secondary Subscriber Relation To Guarantor:   Secondary Payor:   Secondary Plan:   Secondary Group #:   Secondary Subscriber ID:   Secondary Subscriber DOB:   HITECH  NO      END PATIENT REGISTRATION INFORMATION       Diagnosis: 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

## 2024-02-27 NOTE — Progress Notes (Signed)
 02/27/24 1132   Medicare Checklist   Is this a Medicare patient? Yes   Patient received 1st IMM Letter? Yes   If LOS 3 days or greater, did patient received 2nd IMM Letter? Yes   Date of 2nd IMM Letter 02/27/24   Time of 2nd IMM letter [address]     Bernarda Collier, CMS

## 2024-02-27 NOTE — PT Eval Note (Signed)
 Physical Therapy Eval and Treatment  Roy Delgado.      Post Acute Care Therapy Recommendations   Discharge Recommendations:  Home with supervision, Home with home health PT, Home with home health OT (supervision for OOB activity)    DME needs IF patient is discharging home: No additional equipment/DME recommended at this time, Patient already has needed equipment    Therapy discharge recommendations may change with patient status.  Please refer to most recent note for up-to-date recommendations.    Unit: [address] [facility]  Bed: A2612/A2612-01    ___________________________________________________    Time of Evaluation and Treatment:  Time Calculation   PT Received On: 02/27/24  Start Time: 1410  Stop Time: 1438  Time Calculation (min): 28 min       Evaluation Time: 10 minutes  Treatment Time: 18 minutes    PT Visit Number: 1    Consult received for Roy Delgado. for PT Evaluation and Treatment.  Patient's medical condition is appropriate for Physical therapy intervention at this time.    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 is a 88 y.o. male admitted on 02/23/2024 with S/P DIAGNOSTIC LAPAROSCOPY, EXPLORATORY LAPAROTOMY, LYSIS OF ADHESIONS, REDUCTION OF INTERNAL HERNIA by Dr.Hashim, Mohammad,MD  on 02/23/24.     Problem List[1]    Past Medical/Surgical History:  Medical History[2]  Past Surgical History[3]    X-Rays/Tests/Labs:  Lab Results   Component Value Date/Time    HGB 9.8 (L) 02/27/2024 05:06 AM    HGB 13.1 04/12/2014 12:37 PM    HCT 30.8 (L) 02/27/2024 05:06 AM    HCT 38.8 (L) 04/12/2014 12:37 PM    K 4.4 02/27/2024 05:06 AM    K 4.3 04/12/2014 12:37 PM    NA 133 (L) 02/27/2024 05:06 AM    NA  144 04/12/2014 12:37 PM    TROPI 10.4 02/23/2024 02:46 AM    TROPI 0.08 07/26/2010 01:55 AM    TROPI 0.04 07/25/2010 04:56 PM       All imaging reviewed, please see chart for details.    Social History:  Prior Level of Function  Prior level of function: Ambulates with assistive device, Needs assistance with ADLs  Assistive Device: Four wheel walker  Baseline Activity Level: Household ambulation  Ambulated 100 feet or more prior to admission: Yes  Driving: does not drive  DME Currently at Home: Environmental Consultant, [employer], ADL- Engineer, [employer]    Home Living Arrangements  Living Arrangements: Children (Daughter)  Type of Home: House  Home Layout: Multi-level, Stairs to enter with rails (add number in comment) (8 STE w rail)  Bathroom Shower/Tub: Pension Scheme Manager: Raised  Bathroom Equipment: Toilet riser, Grab bars around toilet, Grab bars in shower  Bathroom Accessibility: Accessible via walker  DME Currently at Home: Environmental Consultant, [employer], ADL- Engineer, [employer]  Home Living - Notes / Comments: Lives with daughter but has caregiver assistance M-F from 1030am-430pm.      Subjective:  Patient is agreeable to participation in the therapy session.  Patient Goal: To go home  Pain Assessment  Pain Assessment: No/denies pain (pt reports 0/10 pain at rest, 6/10 pain w/  ambulation)      Objective:  Observation of Patient/Vital Signs:  Vitals:    02/27/24 1157   BP: 163/77   Pulse: 69   Resp: 17   Temp: 98.1 F (36.7 C)   SpO2: 97%        Inspection/Posture: Received supine in bed w/ caregiver present at bedside    Cognitive Status and Neuro Exam:  Cognition/Neuro Status  Arousal/Alertness: Appropriate responses to stimuli  Attention Span: Appears intact  Orientation Level: Oriented X4  Memory: Appears intact  Following Commands: Follows all commands and directions without difficulty  Safety Awareness: independent  Insights: Fully aware of deficits  Problem Solving: Able to problem solve independently  Behavior:  calm;cooperative;attentive  Motor Planning: intact  Coordination: intact    Musculoskeletal Examination  Gross ROM  Right Lower Extremity ROM: within functional limits  Left Lower Extremity ROM: within functional limits  Gross Strength  Right Lower Extremity Strength: within functional limits  Left Lower Extremity Strength: within functional limits       Functional Mobility:  Functional Mobility  Supine to Sit: Stand by Assist  Scooting to EOB: Contact Guard Assist  Sit to Supine: Stand by Assist  Sit to Stand: Contact Guard Assist;Increased Time;Increased Effort;bed elevated (w/ FWW)  Stand to Sit: Risk Analyst (w/ FWW)     Locomotion  Ambulation: Contact Guard Assist;with front-wheeled walker  Pattern: decreased step length;decreased cadence     Balance  Balance  Balance: needs focused assessment  Sitting - Static: Good  Sitting - Dynamic: Good  Standing - Static: Good (w/ FWW)  Standing - Dynamic: Fair (w/ FWW)    Participation and Activity Tolerance  Participation and Endurance  Participation Effort: good  Endurance: Tolerates < 10 min exercise, no significant change in vital signs  [place] Dyspnea Scale: 3+ Dyspnea         Educated the Patient to role of physical therapy, plan of care, goals of therapy and safety with mobility and ADLs, discharge instructions, home safety with verbalized understanding  and demonstrated understanding.    Patient left in bed. Pt left with all appropriate medical equipment in place, call bell and pt personal items/needs within reach, and with alarm in place. RN notified of session outcome.        Assessment:  Roy Delgado. is a 88 y.o. male admitted 02/23/2024.  PT Assessment  Assessment: Decreased safety/judgement during functional mobility;Decreased endurance/activity tolerance;Decreased functional mobility;Decreased balance;Gait impairment  Prognosis: Good;With continued PT status post acute discharge  Progress: Improving as expected    Treatment:  RN  cleared pt for PT & pt agreeable to PT eval. Pt performed supine to sit transfer towards EOB at SBA level. Increased time/effort required for pt to transition from sit to stand from EOB. Ambulation within room w/ FWW support at [facility] w/ pt reporting increase in pain to 6/10 and demonstrating shortness of breath. Further ambulation limited by fatigue/shortness of breath requiring pt to return to bed and assume supine position. Encouraged pursed lip breathing to decrease RR. Pt is not at their baseline for functional mobility/ambulation and will continue to benefit from skilled PT services.     Plan:  Treatment/Interventions: Exercise, Gait training, Stair training, Neuromuscular re-education, Functional transfer training, LE strengthening/ROM, Endurance training, Patient/family training, Equipment eval/education, Bed mobility, Continued evaluation  PT Frequency: 1-2x/wk  Risks/Benefits/POC Discussed with Pt/Family: With patient    PMP Activity: Step 6 - Walks in Room  Distance Walked (ft) (Step 6,7): 20  Feet (w/ FWW)      Goals:  Goals  Time for Goal Acheivement: By time of discharge  Pt Will Perform Sit to Stand: with supervision, to maximize functional mobility and independence  Pt Will Transfer Bed/Chair: with supervision, to maximize functional mobility and independence  Pt Will Ambulate: 31-50 feet, with stand by assist, to maximize functional mobility and independence  Pt Will Go Up / Down Stairs: 6-10 stairs, with minimal assist, With rail, to maximize functional mobility and independence    Elinore Blanch PT,DPT  02/27/2024  2:51 PM    Physical Therapist  Physical Medicine and Rehabilitation   [phone]        [facility]  Patient: Roy Kolton. MRN#: [id number]  Unit: [address] [facility] Bed: A2612/A2612-01        [1]   Patient Active Problem List  Diagnosis    Prostate cancer    Volvulus    Chronic systolic CHF (congestive heart failure), NYHA class 2    Benign essential hypertension    Chronic  pain disorder    Type II or unspecified type diabetes mellitus with renal manifestations, not stated as uncontrolled(250.40)    Cystitis    Influenza   [2]   Past Medical History:  Diagnosis Date    Diabetes     Glaucoma     Hypertension    [3]   Past Surgical History:  Procedure Laterality Date    appendectomy 1991      LAPAROSCOPY, DIAGNOSTIC N/A 01/13/2024    Procedure: LAPAROSCOPY, DIAGNOSTIC, LYSIS OF ADHESIONS;  Surgeon: Soyla Dallas BROCKS, DO;  Location: [facility];  Service: General;  Laterality: N/A;    LAPAROSCOPY, DIAGNOSTIC N/A 02/23/2024    Procedure: DIAGNOSTIC LAPAROSCOPY, EXPLORATORY LAPAROTOMY, LYSIS OF ADHESIONS, REDUCTION OF INTERNAL HERNIA (50679, 49400, 44050, 44005);  Surgeon: Herb Re, MD;  Location: [facility];  Service: General;  Laterality: N/A;    LAPAROSCOPY, DIAGNOSTIC BARIATRIC  01/13/2024    Procedure: LAPAROSCOPY, REDUCTION OF VOLVULUS, REPAIR INTERNAL HERNIA;  Surgeon: Soyla Dallas BROCKS, DO;  Location: [facility];  Service: General;;

## 2024-02-27 NOTE — Plan of Care (Signed)
 Discharge instructions given, HHPT, answered any questions, pain controlled, and pt ready to go.      Problem: Pain interferes with ability to perform ADL  Goal: Pain at adequate level as identified by patient  Outcome: Adequate for Discharge     Problem: Side Effects from Pain Analgesia  Goal: Patient will experience minimal side effects of analgesic therapy  Outcome: Adequate for Discharge     Problem: Moderate/High Fall Risk Score >5  Goal: Patient will remain free of falls  Outcome: Adequate for Discharge     Problem: Compromised Sensory Perception  Goal: Sensory Perception Interventions  Outcome: Adequate for Discharge     Problem: Compromised Moisture  Goal: Moisture level Interventions  Outcome: Adequate for Discharge     Problem: Compromised Activity/Mobility  Goal: Activity/Mobility Interventions  Outcome: Adequate for Discharge     Problem: Compromised Nutrition  Goal: Nutrition Interventions  Outcome: Adequate for Discharge     Problem: Compromised Friction/Shear  Goal: Friction and Shear Interventions  Outcome: Adequate for Discharge     Problem: Safety  Goal: Patient will be free from injury during hospitalization  Outcome: Adequate for Discharge     Problem: Altered GI Function  Goal: Fluid and electrolyte balance are achieved/maintained  Outcome: Adequate for Discharge

## 2024-02-27 NOTE — Progress Notes (Signed)
 Initial Case Management Assessment and Discharge Planning  [facility]   Patient Name: Roy Delgado, Roy Delgado.   Date of Birth 1936/08/16   Attending Physician: Fernand Leu, DO   Primary Care Physician: Celestina Lucie PARAS, MD   Length of Stay 4   Reason for Consult / Chief Complaint IDPA        Situation   Admission DX:   1. Influenza    2. Midgut volvulus        A/O Status: X 3    Patient admitted from: ER  Admission Status: inpatient    Health Care Agent: Self  Name: Roy Delgado,   Phone number: [phone]       Background     Advanced directive:   <no information>    has an advance directive - a copy HAS NOT been provided. Requested to provide copy    Code Status:   Full Code     Residence: Multi-story home    PCP: Lucie PARAS Celestina, MD  Patient Contact:   [phone] (home)     [phone] (mobile)     Emergency contact:   Extended Emergency Contact Information  Primary Emergency Contact: Blackie,Nicole   [place]  Home Phone: [phone]  Mobile Phone: [phone]  Relation: Daughter      ADL/IADL's: Dependent and Needs Assist  Previous Level of function: 4 Minimal Assist    DME: Front wheeled walker     Pharmacy:     [id number] [place], [place] - [address] [facility] [address]  [address]  [place] [address]  Phone: [phone] Fax: [phone]      Prescription Coverage: Yes    Home Health: The patient is receiving home health services, including a home health aide.    Previous SNF/AR: NO      Date First IMM given: 3/2  UAI on file?: No  Transport for discharge? Mode of transportation: Ambuance/Ambulet/Van  Agreeable to Home with family post-discharge:  Yes     Assessment   CM met with Pt.  Spoke with daughter , Roy re: DCP Pt. Lives with daughter, in multi level home with 12 steps to enter home.  Assistance with ADLs/IADLS needed prior to admission. P. has private Pay Aide 5 days a week for assistance   BARRIERS TO DISCHARGE: medical  stability     Recommendation   D/C Plan A: Home with family    D/C Plan B: Home with home health          Marko Canner BSN RN  RN Case Manager  [facility]  [phone]

## 2024-02-27 NOTE — Progress Notes (Signed)
 GENERAL SURGERY  PROGRESS NOTE    Date Time: 02/27/24 9:59 AM  Patient Name: Roy Delgado, Roy Delgado.    Hospital Day: 4    ASSESSMENT:     Problem List[1]    Roy Bugaj Pedram Goodchild. is a 88 y.o. male admitted with SBO secondary to small bowel volvulus now s/p diagnostic laparoscopy c/t laparotomy w/ LOA. Recovering appropriately, passing flatus, tolerating PO. Pending bowel movement for discharge home.     PLAN:   - Add miralax  and fleet enema   - Continue regular diet   - Pain control  - Encourage IS/OOB/ambulation  - DVT ppx: SCDs, lovenox  QD    Patient can [place] home from surgery stand point after he has a bowel movement.     This patient was personally seen and examined by me and I agree with the assessment and plan above.  All notes, labs, and xrays were reviewed.  The patient was seen on the date the note was written.    Improving.  D./c when has bm        Roy CHRISTELLA Pitch, MD , MD [facility] General Surgery  [address]  [place], [place] [address]    [phone]          SUBJECTIVE:   No acute events overnight  AF, VSS, ORA. Pain well-controlled, tolerating PO, passing flatus, adequate UOP.      Objective:   Current Vitals:   Vitals:    02/27/24 0820   BP: 177/80   Pulse: 72   Resp:    Temp:    SpO2:        Intake and Output Summary (Last 24 hours):  I/O last 3 completed shifts:  In: 400 [P.O.:400]  Out: 1580 [Urine:1580]    Labs:     Results       Procedure Component Value Units Date/Time    Whole Blood Glucose POCT [[id number]]  (Abnormal) Collected: 02/27/24 0651    Specimen: Blood, Capillary Updated: 02/27/24 0659     Whole Blood Glucose POCT 382 mg/dL     Basic Metabolic Panel [[id number]]  (Abnormal) Collected: 02/27/24 0506    Specimen: Blood, Venous Updated: 02/27/24 0602     Glucose 346 mg/dL      BUN 15 mg/dL      Creatinine 1.1 mg/dL      Calcium 8.2 mg/dL      Sodium 866 mEq/L      Potassium 4.4 mEq/L      Chloride 102 mEq/L      CO2 22 mEq/L      Anion Gap 9.0     GFR >60.0 mL/min/1.73  m2     Magnesium  [[id number]]  (Normal) Collected: 02/27/24 0506    Specimen: Blood, Venous Updated: 02/27/24 0602     Magnesium  1.8 mg/dL     CBC without Differential [[id number]]  (Abnormal) Collected: 02/27/24 0506    Specimen: Blood, Venous Updated: 02/27/24 0545     WBC 4.43 x10 3/uL      Hemoglobin 9.8 g/dL      Hematocrit 69.1 %      Platelet Count 268 x10 3/uL      MPV 11.0 fL      RBC 3.40 x10 6/uL      MCV 90.6 fL      MCH 28.8 pg      MCHC 31.8 g/dL      RDW 15 %      nRBC % 0.0 /100 WBC  Absolute nRBC 0.00 x10 3/uL     Whole Blood Glucose POCT [[id number]]  (Abnormal) Collected: 02/26/24 2133    Specimen: Blood, Capillary Updated: 02/26/24 2139     Whole Blood Glucose POCT 297 mg/dL     Whole Blood Glucose POCT [[id number]]  (Abnormal) Collected: 02/26/24 1613    Specimen: Blood, Capillary Updated: 02/26/24 1616     Whole Blood Glucose POCT 261 mg/dL     Whole Blood Glucose POCT [[id number]]  (Abnormal) Collected: 02/26/24 1151    Specimen: Blood, Capillary Updated: 02/26/24 1153     Whole Blood Glucose POCT 226 mg/dL     Culture, Blood, Aerobic And Anaerobic [[id number]] Collected: 02/23/24 0246    Specimen: Blood, Venous Updated: 02/26/24 1000     Culture Blood No growth at 3 days            Rads:     Radiology Results (24 Hour)       ** No results found for the last 24 hours. **              Medications:     Current Facility-Administered Medications   Medication Dose Route Frequency    amLODIPine   5 mg Oral Daily    cetirizine   5 mg Oral Daily    dorzolamide -timolol   1 drop Both Eyes Q12H SCH    enoxaparin   40 mg Subcutaneous Daily    insulin  glargine  10 Units Subcutaneous QAM    insulin  lispro  1-3 Units Subcutaneous QHS    insulin  lispro  1-5 Units Subcutaneous TID AC    lidocaine   1 patch Transdermal Q24H    losartan   50 mg Oral Daily    metoprolol  succinate XL  25 mg Oral Daily    montelukast   10 mg Oral QHS    oseltamivir   30 mg Oral Q12H SCH    pantoprazole   40 mg Intravenous Daily     polyethylene glycol  17 g Oral Daily    pregabalin   100 mg Oral QHS    sertraline   25 mg Oral Daily    sodium chloride   500 mL Intravenous Once    sodium phosphate   1 enema Rectal Once    tamsulosin   0.4 mg Oral Daily after dinner       benzocaine -menthol , benzonatate , carboxymethylcellulose sodium, dextrose  **OR** dextrose  **OR** dextrose  **OR** glucagon  (rDNA), magnesium  sulfate, melatonin, morphine  **OR** morphine , naloxone , ondansetron  **OR** ondansetron , oxyCODONE , polyethylene glycol, potassium & sodium phosphates , potassium chloride  **OR** potassium chloride  **OR** potassium chloride , saline     Physical Exam:     Gen: well-appearing, A&Ox3  HEENT: NC/AT  Pulm: No increased work of breathing. ORA   CV: RRR  Abd: soft, nontender, nondistended. No rebound or guarding. Incision c/d/I   Ext: warm and well-perfused   Neuro: grossly intact  MSK: motor and strength intact bilaterally      Elzie ALONSO Major, MD  General Surgery, PGY-4           [1]   Patient Active Problem List  Diagnosis    Prostate cancer    Volvulus    Chronic systolic CHF (congestive heart failure), NYHA class 2    Benign essential hypertension    Chronic pain disorder    Type II or unspecified type diabetes mellitus with renal manifestations, not stated as uncontrolled(250.40)    Cystitis    Influenza

## 2024-02-27 NOTE — Consults (Signed)
 Home Health Discharge Information    Your doctor has ordered Physical Therapy and Occupational Therapy in-home service(s) for you while you recuperate at home, to assist you in the transition from hospital to home.    The agency that you or your representative chose to provide the service:  Name of Home Health Agency Placement: [facility]]  Phone: [phone]         The above services were set up by:  Murleen Curtistine Meissner, RN (Post Acute Care Coordinator)   Phone: [phone]        IF YOU HAVE NOT HEARD FROM YOUR HOME HEALTH AGENCY WITHIN 24-48 HOURS AFTER DISCHARGE PLEASE CALL YOUR AGENCY TO ARRANGE A TIME FOR YOUR FIRST VISIT. FOR ANY SCHEDULING CONCERNS OR QUESTIONS RELATED TO HOME HEALTH, SUCH AS TIME OR DATE PLEASE CONTACT YOUR HOME HEALTH AGENCY AT THE NUMBER LISTED ABOVE.

## 2024-02-28 LAB — CULTURE BLOOD AEROBIC AND ANAEROBIC: Culture Blood: NO GROWTH

## 2024-04-09 ENCOUNTER — Encounter (INDEPENDENT_AMBULATORY_CARE_PROVIDER_SITE_OTHER): Payer: Self-pay | Admitting: Surgery

## 2024-05-22 ENCOUNTER — Ambulatory Visit: Admitting: Internal Medicine

## 2024-05-22 ENCOUNTER — Ambulatory Visit
Admission: RE | Admit: 2024-05-22 | Discharge: 2024-05-22 | Disposition: A | Discharge status: Home / Self Care | Source: Ambulatory Visit | Attending: Internal Medicine | Admitting: Internal Medicine

## 2024-05-22 VITALS — BP 133/81 | HR 71 | Temp 100.8°F | Resp 16 | Ht 72.0 in

## 2024-05-22 DIAGNOSIS — E1010 Type 1 diabetes mellitus with ketoacidosis without coma: Secondary | ICD-10-CM

## 2024-05-22 DIAGNOSIS — I10 Essential (primary) hypertension: Secondary | ICD-10-CM

## 2024-05-22 DIAGNOSIS — J209 Acute bronchitis, unspecified: Secondary | ICD-10-CM

## 2024-05-22 DIAGNOSIS — R051 Acute cough: Secondary | ICD-10-CM

## 2024-05-22 MED ORDER — DOXYCYCLINE HYCLATE 100 MG PO CAPS
100.0000 mg | ORAL_CAPSULE | Freq: Two times a day (BID) | ORAL | 0 refills | Status: AC
Start: 2024-05-22 — End: 2024-06-01

## 2024-05-22 MED ORDER — GUAIFENESIN-CODEINE 100-10 MG/5ML PO SOLN
5.0000 mL | Freq: Three times a day (TID) | ORAL | 0 refills | Status: AC | PRN
Start: 2024-05-22 — End: ?

## 2024-05-22 MED ORDER — VITAMIN E 1000 UNITS PO CAPS
1000.0000 [IU] | ORAL_CAPSULE | Freq: Every day | ORAL | 0 refills | Status: AC
Start: 2024-05-22 — End: 2024-08-20

## 2024-05-22 MED ORDER — VITAMIN C-ROSE HIPS 1000 MG PO TABS
1000.0000 mg | ORAL_TABLET | Freq: Every day | ORAL | 0 refills | Status: AC
Start: 2024-05-22 — End: 2024-08-20

## 2024-05-22 NOTE — Progress Notes (Signed)
 Subjective:       Patient ID: Roy Delgado. is a 88 y.o. male history of diabetes, hypertension, recent small bowel obstruction due to volvulus was admitted to [facility] on 02/23/2024, subsequently underwent diagnostic laparoscopy that was self converted to laparotomy with lysis of adhesions.  Hospital course was uneventful patient was subsequently discharged home on 02/27/2024 has been included until the past few days due to unresolved persistent cough.  Episode of fever patient took Tylenol  prior to coming to the clinic.  RI in guarded condition, recorded a temperature 100.8, seen and evaluated to be ill looking but declined to be sent to the hospital, chest findings significant for bilateral wheezing on both lung fields but patient not acutely short of breath.  Discussed with patient and family for initiation of p.o. antibiotics with a stat chest x-ray advised that if chest x-ray shows significant bilateral pneumonia patient will be advised to be admitted to the hospital for inpatient care.    HPI    The following portions of the patient's history were reviewed and updated as appropriate: allergies, current medications, past family history, past medical history, past social history, past surgical history, and problem list.    Review of Systems   Constitutional:  Negative for chills and fever.   HENT:  Positive for congestion.    Respiratory:  Positive for cough and shortness of breath.    Cardiovascular:  Negative for chest pain, palpitations and leg swelling.   Gastrointestinal:  Negative for abdominal pain, diarrhea, nausea and vomiting.   Genitourinary:  Negative for dysuria, flank pain, frequency, genital sores and hematuria.   Psychiatric/Behavioral: Negative.             Objective:    Physical Exam  Vitals and nursing note reviewed.   Constitutional:       General: He is not in acute distress.     Appearance: He is well-developed.   HENT:      Head: Normocephalic and atraumatic.   Eyes:       Conjunctiva/sclera: Conjunctivae normal.      Pupils: Pupils are equal, round, and reactive to light.   Cardiovascular:      Rate and Rhythm: Normal rate and regular rhythm.   Pulmonary:      Effort: Pulmonary effort is normal. No respiratory distress.      Breath sounds: Normal breath sounds.   Abdominal:      General: Bowel sounds are normal. There is no distension.      Palpations: Abdomen is soft.      Tenderness: There is no abdominal tenderness.   Musculoskeletal:         General: No tenderness. Normal range of motion.      Cervical back: Normal range of motion and neck supple.   Skin:     General: Skin is warm and dry.   Neurological:      Mental Status: He is alert and oriented to person, place, and time.      Deep Tendon Reflexes: Reflexes are normal and symmetric.   Psychiatric:         Behavior: Behavior normal.         Thought Content: Thought content normal.         Judgment: Judgment normal.             Assessment:   Persistent cough in the setting of possible acute bronchitis/superimposed pneumonia  Diabetes poorly controlled  Hypertension  Hyperlipidemia  Global muscular  atrophy and weakness      Plan:   Fall prevention education and counseling  Advised on healthy eating habits  Chest x-ray PA and lateral  Antibiotics doxycycline  Antioxidants: Vitamin C and E  Advised on daily physical activity  Continue both long-acting and short acting insulin   As needed Tylenol  for fever  Advised to follow-up for interval blood work  Procedures  Orders Placed This Encounter   Procedures    X-ray Chest PA and Lateral     Standing Status:   Future     Expected Date:   05/22/2024     Expiration Date:   05/22/2025     Scheduling Instructions:      To schedule your procedure please call a Central Scheduling Number:      [facility] Scheduling [phone]      [facility] Scheduling [phone]     Reason for Exam::   PERSISTENT COUGH     Release to patient:   Immediate     Medications Ordered  This Encounter         Disp Refills Start End    doxycycline (VIBRAMYCIN) 100 MG capsule 20 capsule 0 05/22/2024 06/01/2024    Take 1 capsule (100 mg) by mouth 2 (two) times daily for 10 days - Oral    vitamin E 1000 UNIT capsule 90 capsule 0 05/22/2024 08/20/2024    Take 1 capsule (1,000 Units) by mouth once daily - Oral    Ascorbic Acid (vitamin C with rose hips) 1000 MG tablet 90 tablet 0 05/22/2024 08/20/2024    Take 1 tablet (1,000 mg) by mouth once daily - Oral    guaiFENesin-codeine (ROBITUSSIN w Codeine) 100-10 MG/5ML syrup 150 mL 0 05/22/2024 --    Take 5 mLs by mouth 3 (three) times daily as needed for Cough - Oral

## 2024-07-24 ENCOUNTER — Other Ambulatory Visit: Payer: Self-pay | Admitting: Family Medicine

## 2024-07-24 DIAGNOSIS — E041 Nontoxic single thyroid nodule: Secondary | ICD-10-CM

## 2024-07-27 ENCOUNTER — Ambulatory Visit

## 2024-08-04 ENCOUNTER — Other Ambulatory Visit: Payer: Self-pay | Admitting: Family Medicine

## 2024-08-04 DIAGNOSIS — I509 Heart failure, unspecified: Secondary | ICD-10-CM

## 2024-08-04 DIAGNOSIS — J90 Pleural effusion, not elsewhere classified: Secondary | ICD-10-CM

## 2024-08-04 DIAGNOSIS — M5442 Lumbago with sciatica, left side: Secondary | ICD-10-CM

## 2024-08-13 ENCOUNTER — Ambulatory Visit
Admission: RE | Admit: 2024-08-13 | Discharge: 2024-08-13 | Discharge status: Home / Self Care | Source: Ambulatory Visit | Attending: Family Medicine

## 2024-08-13 ENCOUNTER — Ambulatory Visit
Admission: RE | Admit: 2024-08-13 | Discharge: 2024-08-13 | Disposition: A | Discharge status: Home / Self Care | Source: Ambulatory Visit | Attending: Family Medicine | Admitting: Family Medicine

## 2024-08-13 DIAGNOSIS — J90 Pleural effusion, not elsewhere classified: Secondary | ICD-10-CM | POA: Insufficient documentation

## 2024-08-13 DIAGNOSIS — I509 Heart failure, unspecified: Secondary | ICD-10-CM | POA: Insufficient documentation

## 2024-08-13 DIAGNOSIS — E041 Nontoxic single thyroid nodule: Secondary | ICD-10-CM | POA: Insufficient documentation

## 2024-08-13 NOTE — Progress Notes (Signed)
 HPI:   Roy Delgado. is an 88 y.o. male who is seen for follow up type 1 diabetes.  Today's visit is conducted via video chat.  He was diagnosed with diabetes in 1997.  I last saw him in 4/25.  At that time, I decreased his Lantus  and increased his Humalog .  Today's visit is conducted via video call.  He has overall done well since that time.     He is currently on Lantus  21 units qam and Humalog  5-6 units before meals (takes maximum 7 units if his sugars are high).  Historically, he tended to not take his Humalog  ahead of a meal if his sugars was good or low (below 90) and he would then be very high before the next meal.    He uses the libre3 for CGM.  We did not download his reader today as visit is virtual.  His 7 day average is 195 and his 14 day average is 200. Review of his diet shows that he does not snack before bed as much.  He does not really do any exercise.  The pain in his feet is helped by Lyrica  and hydrocodone .  He is concerned about his diabetes.       ROS:  No chest pain.  No shortness of breath.    Medical History:  Past Medical History:   Diagnosis Date   . Diabetes mellitus type 2, uncomplicated (CMS/HHS-HCC)    . Hypertension    . Prostate cancer (CMS/HHS-HCC)        Surgical History:  Past Surgical History:   Procedure Laterality Date   . APPENDECTOMY  1989       Social History:   reports that he has never smoked. He has never used smokeless tobacco. He reports that he does not drink alcohol and does not use drugs. Widower.     Family History:  family history includes Colon cancer in his father; Heart disease in his brother and father; High blood pressure (Hypertension) in his father and mother; Nephrolithiasis in his father; No Known Problems in his brother, brother, daughter, maternal grandfather, maternal grandmother, paternal grandfather, and paternal grandmother; Ulcerative colitis in his son.    Medications:  Outpatient Medications Marked as Taking for the 08/13/24 encounter  (Telemedicine) with Cherilyn Debby Quivers, MD   Medication Sig Dispense Refill   . aspirin 81 MG EC tablet Take 1 tablet (81 mg total) by mouth once daily (Patient taking differently: Take 81 mg by mouth once daily Not as often) 30 tablet 11   . blood-glucose sensor (FREESTYLE LIBRE 3 SENSOR) Devi Use 1 Device every 14 (fourteen) days 2 each 11   . buPROPion (WELLBUTRIN XL) 150 MG XL tablet TAKE 1 TABLET BY MOUTH EVERY DAY 90 tablet 1   . calcipotriene (DOVONEX) 0.005 % ointment APPLY TO AFFECTED AREA TWICE A DAY FOR 10 DAYS 60 g 0   . clobetasoL (TEMOVATE) 0.05 % cream APPLY TO AFFECTED AREA TWICE A DAY 60 g 1   . COMBIGAN 0.2-0.5 % ophthalmic solution Place 1 drop into both eyes 2 (two) times daily.     4   . dorzolamide  (TRUSOPT ) 2 % ophthalmic solution Place 1 drop into both eyes 3 (three) times daily    3   . ergocalciferol, vitamin D2, 1,250 mcg (50,000 unit) capsule Take 1 capsule (50,000 Units total) by mouth once a week 12 capsule 4   . finasteride (PROSCAR) 5 mg tablet Take 1 tablet (5 mg  total) by mouth once daily 30 tablet 5   . glucagon  (GLUCAGON ) 1 mg Kit Inject into the muscle     . hydrOXYzine HCL (ATARAX) 10 MG tablet TAKE 1 TABLET BY MOUTH 3 TIMES DAILY AS NEEDED FOR ITCHING. 60 tablet 3   . insulin  GLARGINE (LANTUS  SOLOSTAR U-100 INSULIN ) pen injector (concentration 100 units/mL) Inject 20 Units subcutaneously once daily 15 mL 12   . insulin  LISPRO (HUMALOG  KWIKPEN) pen injector (concentration 100 units/mL) INJECT 6-10 UNITS PLUS SLIDING SCALE WITH EACH MEAL MAX DAILY SOE OF 50 UNITS 15 mL 11   . JUBLIA 10 % SolA APPLY TO AFFECTED AREA AS DIRECTED     . latanoprost (XALATAN) 0.005 % ophthalmic solution Place 1 drop into both eyes at bedtime  4   . meclizine (ANTIVERT) 25 mg tablet TAKE 1 TABLET BY MOUTH TWICE A DAY AS NEEDED FOR DIZZINESS     . metoprolol  SUCCinate (TOPROL -XL) 25 MG XL tablet TAKE 1 TABLET BY MOUTH EVERY DAY 90 tablet 1   . montelukast  (SINGULAIR ) 10 mg tablet Take 1 tablet (10  mg total) by mouth at bedtime for 180 days 90 tablet 1   . olmesartan (BENICAR) 20 MG tablet TAKE 1 TABLET (20 MG TOTAL) BY MOUTH ONCE DAILY FOR 180 DAYS 90 tablet 1   . pen needle, diabetic (BD ULTRA-FINE NANO PEN NEEDLE) 32 gauge x 5/32 Ndle Inject 1 each subcutaneously 4 (four) times daily 200 each 11   . polyethylene glycol (MIRALAX ) packet Take 17 g by mouth once daily as needed     . pregabalin  (LYRICA ) 75 MG capsule Take 1 capsule (75 mg total) by mouth once daily 90 capsule 0   . ROCKLATAN 0.02-0.005 % ophthalmic solution INSTILL 1 DROP INTO BOTH EYES IN THE EVENING     . sertraline  (ZOLOFT ) 25 MG tablet TAKE 1 TABLET BY MOUTH EVERY DAY 15 tablet 0   . tamsulosin  (FLOMAX ) 0.4 mg capsule Take 1 capsule (0.4 mg total) by mouth once daily Take 30 minutes after same meal each day. (Patient taking differently: Take 0.4 mg by mouth once daily Take 30 minutes after same meal each day. NOT AS OFTEN) 30 capsule 5         Allergies:  Allergies   Allergen Reactions   . Paxlovid (Eua) [Nirmatrelvir-Ritonavir] Hives     Itching, rash   . Lisinopril Hives       Physical Exam:  There were no vitals filed for this visit.    There is no height or weight on file to calculate BMI.  GEN: well developed male in NAD.    Physical exam otherwise deferred due to coronavirus precautions.        Labs:  10/11/16: A1c = 10.6  04/04/17: C-peptide <0.1, glucose = 282  02/21/17: A1c = 9.3.  Vitamin D = 12.8.  TSH = 0.989.  Microalbumin = 16.  B12 >1500.  07/02/17: A1c = 10.7.  K/Cr/Ca = 4.5/1.4/9.2.  Cholesterol = 129/53/64.9/54.  LFTs normal except mildly elevated alkaline phosphatase = 122.    01/15/18: A1c = 10.9.  K/Cr/Ca = 3.6/1.8/9.1.  Spot microalbumin = 12.  Cholesterol = 137/128/56.1/55.  LFTs normal.  TSH = 0.448.  04/10/18:  A1c= 10.5.  K/Cr/Ca=4.3/1.0/8.9. Spot MA=57.  Chol=129/63/63.9/53.  Alt/Ast=5/10.  TSH=0.691.  08/22/2018: A1c = 10.7.  K/Cr/Ca = 4.3/1.2/9.0, spot microalbumin = 58.  Cholesterol = 131/67/67.3/50.  LFTs normal  except alkaline phosphatase = 134.  12/12/2018: A1c = 10.6.  K/Cr/Ca = 4.4/1.3/8.7,  eGFR = 64.  Spot urine microalbumin = 159.  Cholesterol = 126/38/65.5/53.  LFTs normal except mildly elevated alkaline phosphatase = 123.  TSH = 1.493.    02/23/2019: A1c = 10.0.  K/Cr/Ca = 4.4/1 0.1/9.0.  Spot microalbumin = 155.  LFTs normal except alkaline phosphatase = 127.  TSH = 0.89.    05/19/2020: A1c = 10.4.  K/Cr/Ca = 4.7/1.2/9.0, glucose = 386.  Microalbumin = 84.7.  Cholesterol = 135/67/61.7/60.  LFTs normal except alk phos = 117.  TSH = 1.011.  Vitamin D =24.5.  10/11/2020: A1c = 10.5.  K/Cr/Ca = 4.3/1.2/9.1.  Spot MA = 168.  Chol. = 131/70/53.7/63.  LFTs nl.  TSH = 1.253.      02/07/2021: A1c = 9.6.  K/Cr/Ca = 5.0/1.2/9.2.  Spot MA = 61.  Chol. = 141/51/72.9/58.  LFTs nl., except alk phos = 115.  TSH = 1.383.  06/27/2021:  A1c = 10.2.  K/C/Ca = 4.4/1.2/8.9.  spot MA = 128.  Chol. = 129/49/68.3/51.  LFTs nl, excpet alk phos = 156.  TSH = 1.653.  11/01/2021: A1c = 9.7.  K/Cr/Ca = 4.2/1.2/9.0.  Microalbumin = 85.8.  Cholesterol = 135/65/53.9/68.  LFTs normal except alk phos = 153.  TSH = 1.778.  Vitamin D = 25.4.      02/06/2022:  A1c = 10.  K/Cr/Ca = 4.4/1.1/8.7.  MA = 121.4.  Chol = 125/51/64.8/50.  TSH = 1.809.  10/03/2022: A1c = 9.8  02/12/2023: A1c = 8.9  02/26/2023:  K/Cr/Ca = 4.9/1.1/8.8.  Chol = 107/50/51.3/46.  LFTs nl except for alk phos = 150.  TSH = 1.955.  D = 14.2.  B12 = 236.  02/28/2023:  MA = 226.7    02/27/2024:  K/Cr/Ca= 4.4/1.1/8.2.     Assessment/Plan:  1.  Type 1 diabetes.  He has a hx of poorly controlled diabetes.  His A1c from 2/24 was 8.9.  I don't have a more recent one as he hasn't been to the clinic in person since 2/24.  I can't really make any changes to his regimen without seeing sugars.  I encouraged him to find a local endocrinologist to take over.  I encouraged lifestyle modifications.      2.  Vitamin D deficiency.  His D level was low in 3/24 on 2000 units daily.     3.  B12 deficiency.  He was on  B12 injections in the past.  His B12 was low in 3/24.    4.  Neuropathy.  He has had a painful neuropathy and takes both Lyrica  and hydrocodone .  His filament has been intermittently abnormal in the past.  He saw podiatry in 6/25.  We previously discussed foot care and he knows to be careful with his feet.     5.  Retinopathy. He was [facility] [place] and is receiving retinal injections in his right eye.  He has moderate NPDR and DME.      6.  Hypertension/chronic kidney disease associated with diabetes.  He was followed by Dr. Douglas and was last seen in 3/22.     7.  Prophylaxis.  I will defer a foot exam as he saw podiatry in 6/25.  He last saw the eye doctor in 3/24 as above.   His lipids have been excellent with no therapy.    7.  Moving.  He moved with his daughter.  They are trying to find him an endocrinologist.  I again  told him if he needs me to send a referral, I would be happy to do so.  I also told him to ask his new PCP to send in a referral.    8.  He will return to clinic in 4 months via video visit unless he establishes with a local endo.      Mailed AVS to pts daughter's house:  [address]  [place], [place]  [id number]    This video encounter was conducted with the patient's (or proxy's) verbal consent via secure, interactive audio and video telecommunications while in clinic/office/hospital.    The patient (or proxy) was instructed to have this encounter in a suitably private space and to only have persons present to whom they give permission to participate. In addition, patient identity was confirmed by use of name plus an additional identifier.    This visit was coded based on medical decision making (MDM).     This note is partially prepared by Earla Daria Messier, Scribe, in the presence of and acting as the scribe of Dr. Debby Breaker , MD.       [facility], MD.

## 2024-08-18 ENCOUNTER — Ambulatory Visit: Attending: Family Medicine

## 2024-08-18 DIAGNOSIS — M5441 Lumbago with sciatica, right side: Secondary | ICD-10-CM | POA: Insufficient documentation

## 2024-08-18 DIAGNOSIS — M5442 Lumbago with sciatica, left side: Secondary | ICD-10-CM | POA: Insufficient documentation

## 2024-09-18 ENCOUNTER — Encounter (INDEPENDENT_AMBULATORY_CARE_PROVIDER_SITE_OTHER): Payer: Self-pay

## 2024-09-18 NOTE — Progress Notes (Signed)
 error

## 2024-10-30 ENCOUNTER — Encounter (INDEPENDENT_AMBULATORY_CARE_PROVIDER_SITE_OTHER): Payer: Self-pay

## 2024-11-02 ENCOUNTER — Encounter (INDEPENDENT_AMBULATORY_CARE_PROVIDER_SITE_OTHER): Payer: Self-pay | Admitting: Specialist

## 2024-11-02 ENCOUNTER — Ambulatory Visit: Attending: Pulmonary Disease

## 2024-11-02 DIAGNOSIS — J84112 Idiopathic pulmonary fibrosis: Secondary | ICD-10-CM

## 2024-11-02 LAB — IMMUNOGLOBULINS IGG, IGA, IGM, QUANTITATIVE
Immunoglobulin A: 218 mg/dL (ref 101–645)
Immunoglobulin G: 1276 mg/dL (ref 540–1822)
Immunoglobulin M: 57 mg/dL (ref 22–293)

## 2024-11-02 LAB — RHEUMATOID FACTOR: Rheumatoid Factor: <13 [IU]/mL (ref <30.0)

## 2024-11-02 LAB — SEDIMENTATION RATE: Sed Rate: 36 mm/h — ABNORMAL HIGH (ref <=15)

## 2024-11-03 ENCOUNTER — Encounter (HOSPITAL_BASED_OUTPATIENT_CLINIC_OR_DEPARTMENT_OTHER): Payer: Self-pay

## 2024-11-03 LAB — ANA SCREEN, IFA WITH REFLEX TO TITER AND PATTERN
ANA Screen: POSITIVE — AB
ANA Titer #1: 1:640 {titer} — AB

## 2024-11-03 LAB — LAB USE ONLY - ANA PATHOLOGY REVIEW

## 2024-11-04 LAB — ANGIOTENSIN CONVERTING ENZYME: Angiotensin Converting Enzyme: 28.5 U/L (ref 9–67)

## 2024-11-13 ENCOUNTER — Other Ambulatory Visit: Payer: Self-pay | Admitting: Pulmonary Disease

## 2024-11-13 DIAGNOSIS — K219 Gastro-esophageal reflux disease without esophagitis: Secondary | ICD-10-CM

## 2024-11-24 ENCOUNTER — Other Ambulatory Visit: Payer: Self-pay | Admitting: Pulmonary Disease

## 2024-11-24 ENCOUNTER — Ambulatory Visit
Admission: RE | Admit: 2024-11-24 | Discharge: 2024-11-24 | Disposition: A | Discharge status: Home / Self Care | Source: Ambulatory Visit | Attending: Pulmonary Disease | Admitting: Pulmonary Disease

## 2024-11-24 DIAGNOSIS — K219 Gastro-esophageal reflux disease without esophagitis: Secondary | ICD-10-CM

## 2024-11-24 MED ORDER — BARIUM SULFATE 60 % PO SUSP
355.0000 mL | Freq: Once | ORAL | Status: AC | PRN
  Administered 2024-11-24: 65 mL via ORAL
  Filled 2024-11-24: qty 355

## 2024-11-30 ENCOUNTER — Ambulatory Visit

## 2025-05-04 ENCOUNTER — Ambulatory Visit (INDEPENDENT_AMBULATORY_CARE_PROVIDER_SITE_OTHER): Admitting: Specialist

## 2025-08-03 ENCOUNTER — Ambulatory Visit (INDEPENDENT_AMBULATORY_CARE_PROVIDER_SITE_OTHER): Admitting: Specialist
